# Patient Record
Sex: Female | Born: 1935
Health system: Southern US, Community
[De-identification: ages and names within clinical notes are randomized; demographics above are authoritative.]

## PROBLEM LIST (undated history)

## (undated) DIAGNOSIS — K5909 Other constipation: Secondary | ICD-10-CM

## (undated) DIAGNOSIS — E785 Hyperlipidemia, unspecified: Secondary | ICD-10-CM

## (undated) DIAGNOSIS — K2970 Gastritis, unspecified, without bleeding: Secondary | ICD-10-CM

## (undated) DIAGNOSIS — I872 Venous insufficiency (chronic) (peripheral): Secondary | ICD-10-CM

## (undated) DIAGNOSIS — G8929 Other chronic pain: Secondary | ICD-10-CM

## (undated) DIAGNOSIS — N6019 Diffuse cystic mastopathy of unspecified breast: Secondary | ICD-10-CM

## (undated) DIAGNOSIS — G459 Transient cerebral ischemic attack, unspecified: Secondary | ICD-10-CM

## (undated) DIAGNOSIS — C50919 Malignant neoplasm of unspecified site of unspecified female breast: Secondary | ICD-10-CM

## (undated) DIAGNOSIS — D0362 Melanoma in situ of left upper limb, including shoulder: Secondary | ICD-10-CM

## (undated) DIAGNOSIS — R51 Headache: Secondary | ICD-10-CM

## (undated) DIAGNOSIS — S329XXA Fracture of unspecified parts of lumbosacral spine and pelvis, initial encounter for closed fracture: Secondary | ICD-10-CM

## (undated) DIAGNOSIS — R519 Headache, unspecified: Secondary | ICD-10-CM

## (undated) HISTORY — DX: Fracture of unspecified parts of lumbosacral spine and pelvis, initial encounter for closed fracture: S32.9XXA

## (undated) HISTORY — DX: Gastritis, unspecified, without bleeding: K29.70

## (undated) HISTORY — PX: APPENDECTOMY: SHX54

## (undated) HISTORY — DX: Malignant neoplasm of unspecified site of unspecified female breast: C50.919

## (undated) HISTORY — DX: Other constipation: K59.09

## (undated) HISTORY — DX: Transient cerebral ischemic attack, unspecified: G45.9

## (undated) HISTORY — DX: Hyperlipidemia, unspecified: E78.5

## (undated) HISTORY — DX: Other chronic pain: G89.29

## (undated) HISTORY — PX: BREAST EXCISIONAL BIOPSY: SUR124

## (undated) HISTORY — DX: Headache, unspecified: R51.9

## (undated) HISTORY — PX: EYE SURGERY: SHX253

## (undated) HISTORY — PX: BREAST LUMPECTOMY: SHX2

## (undated) HISTORY — DX: Diffuse cystic mastopathy of unspecified breast: N60.19

## (undated) HISTORY — DX: Venous insufficiency (chronic) (peripheral): I87.2

## (undated) HISTORY — PX: BREAST CYST ASPIRATION: SHX578

## (undated) HISTORY — DX: Headache: R51

---

## 1998-03-25 ENCOUNTER — Other Ambulatory Visit: Admission: RE | Admit: 1998-03-25 | Discharge: 1998-03-25 | Payer: Self-pay | Admitting: Obstetrics and Gynecology

## 1998-09-26 ENCOUNTER — Other Ambulatory Visit: Admission: RE | Admit: 1998-09-26 | Discharge: 1998-09-26 | Payer: Self-pay | Admitting: Obstetrics and Gynecology

## 1999-03-27 ENCOUNTER — Other Ambulatory Visit: Admission: RE | Admit: 1999-03-27 | Discharge: 1999-03-27 | Payer: Self-pay | Admitting: Obstetrics and Gynecology

## 1999-08-04 ENCOUNTER — Ambulatory Visit (HOSPITAL_BASED_OUTPATIENT_CLINIC_OR_DEPARTMENT_OTHER): Admission: RE | Admit: 1999-08-04 | Discharge: 1999-08-04 | Payer: Self-pay | Admitting: General Surgery

## 2000-01-02 ENCOUNTER — Encounter: Admission: RE | Admit: 2000-01-02 | Discharge: 2000-01-02 | Payer: Self-pay | Admitting: General Surgery

## 2000-01-02 ENCOUNTER — Encounter: Payer: Self-pay | Admitting: General Surgery

## 2000-06-10 ENCOUNTER — Other Ambulatory Visit: Admission: RE | Admit: 2000-06-10 | Discharge: 2000-06-10 | Payer: Self-pay | Admitting: Obstetrics and Gynecology

## 2001-01-06 ENCOUNTER — Encounter: Payer: Self-pay | Admitting: General Surgery

## 2001-01-06 ENCOUNTER — Encounter: Admission: RE | Admit: 2001-01-06 | Discharge: 2001-01-06 | Payer: Self-pay | Admitting: General Surgery

## 2001-08-17 ENCOUNTER — Other Ambulatory Visit: Admission: RE | Admit: 2001-08-17 | Discharge: 2001-08-17 | Payer: Self-pay | Admitting: Internal Medicine

## 2002-01-03 ENCOUNTER — Encounter: Payer: Self-pay | Admitting: General Surgery

## 2002-01-03 ENCOUNTER — Encounter: Admission: RE | Admit: 2002-01-03 | Discharge: 2002-01-03 | Payer: Self-pay | Admitting: General Surgery

## 2002-06-07 ENCOUNTER — Ambulatory Visit (HOSPITAL_COMMUNITY): Admission: RE | Admit: 2002-06-07 | Discharge: 2002-06-07 | Payer: Self-pay | Admitting: Gastroenterology

## 2003-01-08 ENCOUNTER — Encounter: Admission: RE | Admit: 2003-01-08 | Discharge: 2003-01-08 | Payer: Self-pay | Admitting: General Surgery

## 2003-01-08 ENCOUNTER — Encounter: Payer: Self-pay | Admitting: General Surgery

## 2003-06-21 LAB — FECAL OCCULT BLOOD, GUAIAC: Fecal Occult Blood: NEGATIVE

## 2004-01-15 ENCOUNTER — Encounter: Admission: RE | Admit: 2004-01-15 | Discharge: 2004-01-15 | Payer: Self-pay | Admitting: General Surgery

## 2004-04-04 ENCOUNTER — Ambulatory Visit: Payer: Self-pay | Admitting: Family Medicine

## 2004-04-21 ENCOUNTER — Ambulatory Visit: Payer: Self-pay | Admitting: Family Medicine

## 2004-10-22 ENCOUNTER — Ambulatory Visit: Payer: Self-pay | Admitting: Family Medicine

## 2004-10-27 ENCOUNTER — Ambulatory Visit: Payer: Self-pay | Admitting: Family Medicine

## 2004-10-27 ENCOUNTER — Other Ambulatory Visit: Admission: RE | Admit: 2004-10-27 | Discharge: 2004-10-27 | Payer: Self-pay | Admitting: Family Medicine

## 2004-10-27 LAB — CONVERTED CEMR LAB: Pap Smear: NORMAL

## 2004-11-06 ENCOUNTER — Ambulatory Visit: Payer: Self-pay | Admitting: Family Medicine

## 2004-12-01 ENCOUNTER — Ambulatory Visit: Payer: Self-pay | Admitting: Family Medicine

## 2005-01-20 ENCOUNTER — Encounter: Admission: RE | Admit: 2005-01-20 | Discharge: 2005-01-20 | Payer: Self-pay | Admitting: General Surgery

## 2005-03-04 ENCOUNTER — Ambulatory Visit: Payer: Self-pay | Admitting: Family Medicine

## 2005-10-07 ENCOUNTER — Ambulatory Visit: Payer: Self-pay | Admitting: Family Medicine

## 2005-10-28 ENCOUNTER — Ambulatory Visit: Payer: Self-pay | Admitting: Family Medicine

## 2006-01-21 ENCOUNTER — Encounter: Admission: RE | Admit: 2006-01-21 | Discharge: 2006-01-21 | Payer: Self-pay | Admitting: General Surgery

## 2006-04-29 ENCOUNTER — Ambulatory Visit: Payer: Self-pay | Admitting: Family Medicine

## 2006-05-26 ENCOUNTER — Ambulatory Visit: Payer: Self-pay | Admitting: Family Medicine

## 2006-08-25 ENCOUNTER — Ambulatory Visit: Payer: Self-pay | Admitting: Family Medicine

## 2006-08-25 LAB — CONVERTED CEMR LAB
Creatinine,U: 57 mg/dL
Hgb A1c MFr Bld: 6.7 % — ABNORMAL HIGH (ref 4.6–6.0)
Microalb Creat Ratio: 12.3 mg/g (ref 0.0–30.0)

## 2006-11-29 ENCOUNTER — Ambulatory Visit: Payer: Self-pay | Admitting: Family Medicine

## 2006-11-29 DIAGNOSIS — E7800 Pure hypercholesterolemia, unspecified: Secondary | ICD-10-CM

## 2006-12-02 LAB — CONVERTED CEMR LAB
ALT: 16 units/L (ref 0–35)
Hgb A1c MFr Bld: 6.9 % — ABNORMAL HIGH (ref 4.6–6.0)
Triglycerides: 106 mg/dL (ref 0–149)
VLDL: 21 mg/dL (ref 0–40)

## 2007-01-25 ENCOUNTER — Encounter: Admission: RE | Admit: 2007-01-25 | Discharge: 2007-01-25 | Payer: Self-pay | Admitting: General Surgery

## 2007-01-27 ENCOUNTER — Encounter: Admission: RE | Admit: 2007-01-27 | Discharge: 2007-01-27 | Payer: Self-pay | Admitting: General Surgery

## 2007-02-01 ENCOUNTER — Encounter (INDEPENDENT_AMBULATORY_CARE_PROVIDER_SITE_OTHER): Payer: Self-pay | Admitting: *Deleted

## 2007-03-08 ENCOUNTER — Ambulatory Visit: Payer: Self-pay | Admitting: Family Medicine

## 2007-03-09 LAB — CONVERTED CEMR LAB: Hgb A1c MFr Bld: 6.5 % — ABNORMAL HIGH (ref 4.6–6.0)

## 2007-06-10 ENCOUNTER — Ambulatory Visit: Payer: Self-pay | Admitting: Family Medicine

## 2007-06-13 LAB — CONVERTED CEMR LAB
Albumin: 3.7 g/dL (ref 3.5–5.2)
BUN: 15 mg/dL (ref 6–23)
Creatinine, Ser: 0.8 mg/dL (ref 0.4–1.2)
GFR calc non Af Amer: 74 mL/min
Hgb A1c MFr Bld: 6.7 % — ABNORMAL HIGH (ref 4.6–6.0)
LDL Cholesterol: 70 mg/dL (ref 0–99)
Phosphorus: 4.4 mg/dL (ref 2.3–4.6)
Potassium: 4.6 meq/L (ref 3.5–5.1)
Total CHOL/HDL Ratio: 3.2

## 2007-07-21 ENCOUNTER — Telehealth: Payer: Self-pay | Admitting: Family Medicine

## 2007-09-23 HISTORY — PX: BREAST LUMPECTOMY: SHX2

## 2007-10-03 ENCOUNTER — Telehealth (INDEPENDENT_AMBULATORY_CARE_PROVIDER_SITE_OTHER): Payer: Self-pay | Admitting: *Deleted

## 2007-10-04 ENCOUNTER — Ambulatory Visit: Payer: Self-pay | Admitting: Family Medicine

## 2007-10-06 ENCOUNTER — Ambulatory Visit: Payer: Self-pay | Admitting: Family Medicine

## 2007-10-06 DIAGNOSIS — N6019 Diffuse cystic mastopathy of unspecified breast: Secondary | ICD-10-CM | POA: Insufficient documentation

## 2007-10-06 DIAGNOSIS — I872 Venous insufficiency (chronic) (peripheral): Secondary | ICD-10-CM | POA: Insufficient documentation

## 2007-10-06 DIAGNOSIS — K5730 Diverticulosis of large intestine without perforation or abscess without bleeding: Secondary | ICD-10-CM

## 2007-10-06 DIAGNOSIS — E119 Type 2 diabetes mellitus without complications: Secondary | ICD-10-CM

## 2007-10-06 LAB — CONVERTED CEMR LAB
Chloride: 111 meq/L (ref 96–112)
Creatinine,U: 87.8 mg/dL
GFR calc Af Amer: 78 mL/min
GFR calc non Af Amer: 65 mL/min
LDL Cholesterol: 66 mg/dL (ref 0–99)
Microalb, Ur: 1.9 mg/dL (ref 0.0–1.9)
Phosphorus: 3.9 mg/dL (ref 2.3–4.6)
Potassium: 4.9 meq/L (ref 3.5–5.1)
Total CHOL/HDL Ratio: 3.4

## 2007-10-12 ENCOUNTER — Encounter: Payer: Self-pay | Admitting: Family Medicine

## 2007-10-12 ENCOUNTER — Encounter (INDEPENDENT_AMBULATORY_CARE_PROVIDER_SITE_OTHER): Payer: Self-pay | Admitting: Diagnostic Radiology

## 2007-10-12 ENCOUNTER — Encounter: Admission: RE | Admit: 2007-10-12 | Discharge: 2007-10-12 | Payer: Self-pay | Admitting: Family Medicine

## 2007-10-18 HISTORY — PX: BREAST BIOPSY: SHX20

## 2007-10-20 ENCOUNTER — Encounter: Payer: Self-pay | Admitting: Family Medicine

## 2007-10-21 ENCOUNTER — Encounter: Admission: RE | Admit: 2007-10-21 | Discharge: 2007-10-21 | Payer: Self-pay | Admitting: Family Medicine

## 2007-10-25 ENCOUNTER — Encounter: Payer: Self-pay | Admitting: Family Medicine

## 2007-10-25 ENCOUNTER — Ambulatory Visit (HOSPITAL_COMMUNITY): Admission: RE | Admit: 2007-10-25 | Discharge: 2007-10-26 | Payer: Self-pay | Admitting: Surgery

## 2007-10-25 ENCOUNTER — Encounter (INDEPENDENT_AMBULATORY_CARE_PROVIDER_SITE_OTHER): Payer: Self-pay | Admitting: Surgery

## 2007-10-28 ENCOUNTER — Ambulatory Visit: Payer: Self-pay | Admitting: Oncology

## 2007-11-09 ENCOUNTER — Encounter: Payer: Self-pay | Admitting: Family Medicine

## 2007-11-09 LAB — CBC WITH DIFFERENTIAL/PLATELET
Basophils Absolute: 0.1 10*3/uL (ref 0.0–0.1)
Eosinophils Absolute: 0.3 10*3/uL (ref 0.0–0.5)
HCT: 40.6 % (ref 34.8–46.6)
HGB: 13.7 g/dL (ref 11.6–15.9)
MCH: 31.7 pg (ref 26.0–34.0)
MCV: 94.3 fL (ref 81.0–101.0)
MONO%: 7.8 % (ref 0.0–13.0)
NEUT#: 8.7 10*3/uL — ABNORMAL HIGH (ref 1.5–6.5)
NEUT%: 69.8 % (ref 39.6–76.8)
RDW: 13.3 % (ref 11.3–14.5)

## 2007-11-10 LAB — COMPREHENSIVE METABOLIC PANEL
ALT: 18 U/L (ref 0–35)
BUN: 20 mg/dL (ref 6–23)
CO2: 28 mEq/L (ref 19–32)
Creatinine, Ser: 0.78 mg/dL (ref 0.40–1.20)
Total Bilirubin: 0.5 mg/dL (ref 0.3–1.2)

## 2007-11-10 LAB — LACTATE DEHYDROGENASE: LDH: 123 U/L (ref 94–250)

## 2007-11-10 LAB — CANCER ANTIGEN 27.29: CA 27.29: 34 U/mL (ref 0–39)

## 2007-11-10 LAB — VITAMIN D 25 HYDROXY (VIT D DEFICIENCY, FRACTURES): Vit D, 25-Hydroxy: 51 ng/mL (ref 30–89)

## 2007-11-14 ENCOUNTER — Ambulatory Visit (HOSPITAL_COMMUNITY): Admission: RE | Admit: 2007-11-14 | Discharge: 2007-11-14 | Payer: Self-pay | Admitting: Oncology

## 2007-11-29 ENCOUNTER — Encounter: Admission: RE | Admit: 2007-11-29 | Discharge: 2007-11-29 | Payer: Self-pay | Admitting: Oncology

## 2007-12-27 ENCOUNTER — Ambulatory Visit: Payer: Self-pay | Admitting: Oncology

## 2007-12-31 ENCOUNTER — Encounter: Payer: Self-pay | Admitting: Family Medicine

## 2008-02-09 ENCOUNTER — Encounter: Payer: Self-pay | Admitting: Family Medicine

## 2008-04-12 ENCOUNTER — Ambulatory Visit: Payer: Self-pay | Admitting: Family Medicine

## 2008-04-16 LAB — CONVERTED CEMR LAB
ALT: 19 units/L (ref 0–35)
Cholesterol: 132 mg/dL (ref 0–200)
LDL Cholesterol: 64 mg/dL (ref 0–99)
Microalb Creat Ratio: 4.5 mg/g (ref 0.0–30.0)
Triglycerides: 116 mg/dL (ref 0–149)
VLDL: 23 mg/dL (ref 0–40)

## 2008-04-27 ENCOUNTER — Ambulatory Visit: Payer: Self-pay | Admitting: Oncology

## 2008-05-10 ENCOUNTER — Encounter: Payer: Self-pay | Admitting: Family Medicine

## 2008-05-11 ENCOUNTER — Ambulatory Visit: Payer: Self-pay | Admitting: Genetic Counselor

## 2008-05-11 ENCOUNTER — Encounter: Payer: Self-pay | Admitting: Family Medicine

## 2008-08-17 ENCOUNTER — Telehealth (INDEPENDENT_AMBULATORY_CARE_PROVIDER_SITE_OTHER): Payer: Self-pay | Admitting: *Deleted

## 2008-08-23 DIAGNOSIS — G459 Transient cerebral ischemic attack, unspecified: Secondary | ICD-10-CM

## 2008-08-23 HISTORY — DX: Transient cerebral ischemic attack, unspecified: G45.9

## 2008-09-07 ENCOUNTER — Inpatient Hospital Stay (HOSPITAL_COMMUNITY): Admission: EM | Admit: 2008-09-07 | Discharge: 2008-09-08 | Payer: Self-pay | Admitting: Emergency Medicine

## 2008-09-07 ENCOUNTER — Ambulatory Visit: Payer: Self-pay | Admitting: Infectious Diseases

## 2008-09-08 ENCOUNTER — Encounter: Payer: Self-pay | Admitting: Family Medicine

## 2008-09-10 ENCOUNTER — Ambulatory Visit: Payer: Self-pay | Admitting: Family Medicine

## 2008-09-10 DIAGNOSIS — C50912 Malignant neoplasm of unspecified site of left female breast: Secondary | ICD-10-CM | POA: Insufficient documentation

## 2008-09-10 DIAGNOSIS — G459 Transient cerebral ischemic attack, unspecified: Secondary | ICD-10-CM | POA: Insufficient documentation

## 2008-09-14 ENCOUNTER — Ambulatory Visit (HOSPITAL_COMMUNITY): Admission: RE | Admit: 2008-09-14 | Discharge: 2008-09-14 | Payer: Self-pay | Admitting: Family Medicine

## 2008-09-14 ENCOUNTER — Encounter: Payer: Self-pay | Admitting: Family Medicine

## 2008-09-20 ENCOUNTER — Telehealth: Payer: Self-pay | Admitting: Family Medicine

## 2008-10-04 ENCOUNTER — Ambulatory Visit: Payer: Self-pay | Admitting: Family Medicine

## 2008-10-08 LAB — CONVERTED CEMR LAB
ALT: 17 units/L (ref 0–35)
AST: 19 units/L (ref 0–37)
Albumin: 3.6 g/dL (ref 3.5–5.2)
BUN: 20 mg/dL (ref 6–23)
Cholesterol: 125 mg/dL (ref 0–200)
GFR calc non Af Amer: 73.59 mL/min (ref >60)
Glucose, Bld: 89 mg/dL (ref 70–99)
Hgb A1c MFr Bld: 6.7 % — ABNORMAL HIGH (ref 4.6–6.5)
Microalb Creat Ratio: 8.5 mg/g (ref 0.0–30.0)
Potassium: 4.1 meq/L (ref 3.5–5.1)
Total Protein: 6.9 g/dL (ref 6.0–8.3)
VLDL: 18.8 mg/dL (ref 0.0–40.0)

## 2008-10-12 ENCOUNTER — Encounter: Admission: RE | Admit: 2008-10-12 | Discharge: 2008-10-12 | Payer: Self-pay | Admitting: Oncology

## 2008-10-12 LAB — HM MAMMOGRAPHY: HM Mammogram: NORMAL

## 2008-10-18 ENCOUNTER — Ambulatory Visit: Payer: Self-pay | Admitting: Family Medicine

## 2008-10-18 DIAGNOSIS — M8580 Other specified disorders of bone density and structure, unspecified site: Secondary | ICD-10-CM

## 2008-10-19 ENCOUNTER — Telehealth: Payer: Self-pay | Admitting: Family Medicine

## 2008-10-23 ENCOUNTER — Encounter: Payer: Self-pay | Admitting: Family Medicine

## 2008-11-01 ENCOUNTER — Encounter: Payer: Self-pay | Admitting: Family Medicine

## 2008-11-21 ENCOUNTER — Ambulatory Visit: Payer: Self-pay | Admitting: Oncology

## 2008-11-27 ENCOUNTER — Encounter: Payer: Self-pay | Admitting: Family Medicine

## 2008-11-27 LAB — CBC WITH DIFFERENTIAL/PLATELET
BASO%: 0.4 % (ref 0.0–2.0)
EOS%: 2.5 % (ref 0.0–7.0)
LYMPH%: 18.2 % (ref 14.0–49.7)
MCH: 31.9 pg (ref 25.1–34.0)
MCHC: 33.8 g/dL (ref 31.5–36.0)
MCV: 94.4 fL (ref 79.5–101.0)
MONO%: 6.2 % (ref 0.0–14.0)
NEUT#: 8.8 10*3/uL — ABNORMAL HIGH (ref 1.5–6.5)
Platelets: 207 10*3/uL (ref 145–400)
RBC: 4.32 10*6/uL (ref 3.70–5.45)
RDW: 13.4 % (ref 11.2–14.5)

## 2008-11-28 ENCOUNTER — Encounter: Payer: Self-pay | Admitting: Family Medicine

## 2008-11-28 LAB — COMPREHENSIVE METABOLIC PANEL
ALT: 13 U/L (ref 0–35)
AST: 15 U/L (ref 0–37)
Albumin: 4 g/dL (ref 3.5–5.2)
Alkaline Phosphatase: 61 U/L (ref 39–117)
Glucose, Bld: 131 mg/dL — ABNORMAL HIGH (ref 70–99)
Potassium: 4.4 mEq/L (ref 3.5–5.3)
Sodium: 141 mEq/L (ref 135–145)
Total Bilirubin: 0.4 mg/dL (ref 0.3–1.2)
Total Protein: 7.3 g/dL (ref 6.0–8.3)

## 2008-11-28 LAB — CANCER ANTIGEN 27.29: CA 27.29: 35 U/mL (ref 0–39)

## 2008-12-21 ENCOUNTER — Ambulatory Visit: Payer: Self-pay | Admitting: Oncology

## 2008-12-21 ENCOUNTER — Encounter: Payer: Self-pay | Admitting: Family Medicine

## 2009-01-29 LAB — HM DIABETES EYE EXAM: HM Diabetic Eye Exam: NORMAL

## 2009-02-28 ENCOUNTER — Ambulatory Visit: Payer: Self-pay | Admitting: Family Medicine

## 2009-04-10 ENCOUNTER — Ambulatory Visit: Payer: Self-pay | Admitting: Family Medicine

## 2009-04-11 LAB — CONVERTED CEMR LAB
ALT: 15 units/L (ref 0–35)
AST: 19 units/L (ref 0–37)
BUN: 23 mg/dL (ref 6–23)
Creatinine, Ser: 0.8 mg/dL (ref 0.4–1.2)
GFR calc non Af Amer: 73.49 mL/min (ref >60)
Glucose, Bld: 93 mg/dL (ref 70–99)
LDL Cholesterol: 69 mg/dL (ref 0–99)
Phosphorus: 3.9 mg/dL (ref 2.3–4.6)
Total CHOL/HDL Ratio: 3
VLDL: 23.2 mg/dL (ref 0.0–40.0)

## 2009-05-31 ENCOUNTER — Encounter: Payer: Self-pay | Admitting: Family Medicine

## 2009-06-19 ENCOUNTER — Telehealth: Payer: Self-pay | Admitting: Family Medicine

## 2009-06-20 ENCOUNTER — Encounter: Payer: Self-pay | Admitting: Family Medicine

## 2009-06-24 ENCOUNTER — Telehealth: Payer: Self-pay | Admitting: Family Medicine

## 2009-06-24 ENCOUNTER — Encounter: Payer: Self-pay | Admitting: Family Medicine

## 2009-06-28 ENCOUNTER — Encounter: Payer: Self-pay | Admitting: Family Medicine

## 2009-07-12 ENCOUNTER — Ambulatory Visit: Payer: Self-pay | Admitting: Oncology

## 2009-07-16 LAB — COMPREHENSIVE METABOLIC PANEL
AST: 20 U/L (ref 0–37)
Albumin: 4.1 g/dL (ref 3.5–5.2)
BUN: 17 mg/dL (ref 6–23)
CO2: 31 mEq/L (ref 19–32)
Calcium: 9.5 mg/dL (ref 8.4–10.5)
Chloride: 105 mEq/L (ref 96–112)
Creatinine, Ser: 0.78 mg/dL (ref 0.40–1.20)
Glucose, Bld: 76 mg/dL (ref 70–99)
Potassium: 3.8 mEq/L (ref 3.5–5.3)

## 2009-07-16 LAB — CBC WITH DIFFERENTIAL/PLATELET
BASO%: 1.4 % (ref 0.0–2.0)
Basophils Absolute: 0.1 10*3/uL (ref 0.0–0.1)
Eosinophils Absolute: 0.3 10*3/uL (ref 0.0–0.5)
HCT: 43.4 % (ref 34.8–46.6)
HGB: 14 g/dL (ref 11.6–15.9)
LYMPH%: 24.4 % (ref 14.0–49.7)
MCHC: 32.3 g/dL (ref 31.5–36.0)
MONO#: 0.6 10*3/uL (ref 0.1–0.9)
NEUT%: 65.4 % (ref 38.4–76.8)
Platelets: 190 10*3/uL (ref 145–400)
WBC: 10 10*3/uL (ref 3.9–10.3)
lymph#: 2.4 10*3/uL (ref 0.9–3.3)

## 2009-07-16 LAB — VITAMIN D 25 HYDROXY (VIT D DEFICIENCY, FRACTURES): Vit D, 25-Hydroxy: 56 ng/mL (ref 30–89)

## 2009-07-16 LAB — LACTATE DEHYDROGENASE: LDH: 125 U/L (ref 94–250)

## 2009-07-23 ENCOUNTER — Encounter: Payer: Self-pay | Admitting: Family Medicine

## 2009-08-09 ENCOUNTER — Ambulatory Visit: Payer: Self-pay | Admitting: Family Medicine

## 2009-08-09 ENCOUNTER — Telehealth (INDEPENDENT_AMBULATORY_CARE_PROVIDER_SITE_OTHER): Payer: Self-pay | Admitting: *Deleted

## 2009-08-12 ENCOUNTER — Telehealth: Payer: Self-pay | Admitting: Family Medicine

## 2009-10-01 ENCOUNTER — Ambulatory Visit: Payer: Self-pay | Admitting: Family Medicine

## 2009-10-02 LAB — CONVERTED CEMR LAB
ALT: 17 units/L (ref 0–35)
Basophils Absolute: 0.1 10*3/uL (ref 0.0–0.1)
CO2: 31 meq/L (ref 19–32)
Chloride: 105 meq/L (ref 96–112)
Creatinine, Ser: 0.7 mg/dL (ref 0.4–1.2)
Direct LDL: 46.3 mg/dL
Eosinophils Relative: 2.9 % (ref 0.0–5.0)
GFR calc non Af Amer: 84.24 mL/min (ref >60)
HCT: 39.1 % (ref 36.0–46.0)
Hemoglobin: 13.4 g/dL (ref 12.0–15.0)
Hgb A1c MFr Bld: 6.7 % — ABNORMAL HIGH (ref 4.6–6.5)
Lymphocytes Relative: 15.2 % (ref 12.0–46.0)
Lymphs Abs: 1.6 10*3/uL (ref 0.7–4.0)
Microalb, Ur: 0.5 mg/dL (ref 0.0–1.9)
Monocytes Relative: 5.6 % (ref 3.0–12.0)
Phosphorus: 3.4 mg/dL (ref 2.3–4.6)
Platelets: 174 10*3/uL (ref 150.0–400.0)
Sodium: 145 meq/L (ref 135–145)
TSH: 2.55 microintl units/mL (ref 0.35–5.50)
Total CHOL/HDL Ratio: 4
VLDL: 44.8 mg/dL — ABNORMAL HIGH (ref 0.0–40.0)
WBC: 10.6 10*3/uL — ABNORMAL HIGH (ref 4.5–10.5)

## 2009-10-14 ENCOUNTER — Encounter: Admission: RE | Admit: 2009-10-14 | Discharge: 2009-10-14 | Payer: Self-pay | Admitting: Oncology

## 2009-10-24 ENCOUNTER — Observation Stay (HOSPITAL_COMMUNITY): Admission: EM | Admit: 2009-10-24 | Discharge: 2009-10-26 | Payer: Self-pay | Admitting: Emergency Medicine

## 2009-10-24 ENCOUNTER — Ambulatory Visit: Payer: Self-pay | Admitting: Cardiovascular Disease

## 2009-10-24 ENCOUNTER — Telehealth: Payer: Self-pay | Admitting: Family Medicine

## 2009-10-25 ENCOUNTER — Encounter (INDEPENDENT_AMBULATORY_CARE_PROVIDER_SITE_OTHER): Payer: Self-pay | Admitting: Internal Medicine

## 2009-10-25 ENCOUNTER — Ambulatory Visit: Payer: Self-pay | Admitting: Vascular Surgery

## 2009-11-06 ENCOUNTER — Ambulatory Visit: Payer: Self-pay | Admitting: Family Medicine

## 2009-11-06 DIAGNOSIS — G43809 Other migraine, not intractable, without status migrainosus: Secondary | ICD-10-CM

## 2009-11-08 ENCOUNTER — Telehealth: Payer: Self-pay | Admitting: Family Medicine

## 2009-11-15 ENCOUNTER — Encounter: Payer: Self-pay | Admitting: Family Medicine

## 2009-11-29 ENCOUNTER — Encounter: Admission: RE | Admit: 2009-11-29 | Discharge: 2009-11-29 | Payer: Self-pay | Admitting: Oncology

## 2009-12-10 ENCOUNTER — Encounter: Payer: Self-pay | Admitting: Family Medicine

## 2009-12-13 ENCOUNTER — Encounter: Payer: Self-pay | Admitting: Family Medicine

## 2010-01-14 ENCOUNTER — Ambulatory Visit: Payer: Self-pay | Admitting: Oncology

## 2010-01-16 LAB — CBC WITH DIFFERENTIAL/PLATELET
BASO%: 0.4 % (ref 0.0–2.0)
EOS%: 2 % (ref 0.0–7.0)
MCH: 31.4 pg (ref 25.1–34.0)
MCHC: 32.9 g/dL (ref 31.5–36.0)
MCV: 95.5 fL (ref 79.5–101.0)
MONO%: 6.4 % (ref 0.0–14.0)
NEUT%: 72 % (ref 38.4–76.8)
RDW: 13.9 % (ref 11.2–14.5)
lymph#: 2.1 10*3/uL (ref 0.9–3.3)

## 2010-01-16 LAB — COMPREHENSIVE METABOLIC PANEL
ALT: 11 U/L (ref 0–35)
AST: 14 U/L (ref 0–37)
Alkaline Phosphatase: 54 U/L (ref 39–117)
Calcium: 10.1 mg/dL (ref 8.4–10.5)
Chloride: 104 mEq/L (ref 96–112)
Creatinine, Ser: 0.87 mg/dL (ref 0.40–1.20)
Potassium: 4.3 mEq/L (ref 3.5–5.3)

## 2010-01-16 LAB — VITAMIN D 25 HYDROXY (VIT D DEFICIENCY, FRACTURES): Vit D, 25-Hydroxy: 53 ng/mL (ref 30–89)

## 2010-01-23 ENCOUNTER — Encounter: Payer: Self-pay | Admitting: Family Medicine

## 2010-02-13 ENCOUNTER — Ambulatory Visit: Payer: Self-pay | Admitting: Family Medicine

## 2010-02-13 DIAGNOSIS — M1990 Unspecified osteoarthritis, unspecified site: Secondary | ICD-10-CM | POA: Insufficient documentation

## 2010-02-20 ENCOUNTER — Ambulatory Visit: Payer: Self-pay | Admitting: Family Medicine

## 2010-03-06 ENCOUNTER — Encounter: Payer: Self-pay | Admitting: Family Medicine

## 2010-04-08 ENCOUNTER — Ambulatory Visit: Payer: Self-pay | Admitting: Family Medicine

## 2010-04-09 LAB — CONVERTED CEMR LAB
ALT: 13 units/L (ref 0–35)
AST: 19 units/L (ref 0–37)
Calcium: 9.6 mg/dL (ref 8.4–10.5)
Creatinine, Ser: 0.8 mg/dL (ref 0.4–1.2)
GFR calc non Af Amer: 72.26 mL/min (ref >60)
Glucose, Bld: 124 mg/dL — ABNORMAL HIGH (ref 70–99)
HDL: 36.4 mg/dL — ABNORMAL LOW (ref >39.00)
LDL Cholesterol: 59 mg/dL (ref 0–99)
Phosphorus: 2.7 mg/dL (ref 2.3–4.6)
Potassium: 4.4 meq/L (ref 3.5–5.1)
Sodium: 137 meq/L (ref 135–145)
Total CHOL/HDL Ratio: 3
VLDL: 31.2 mg/dL (ref 0.0–40.0)

## 2010-04-14 ENCOUNTER — Ambulatory Visit: Payer: Self-pay | Admitting: Family Medicine

## 2010-04-25 ENCOUNTER — Telehealth: Payer: Self-pay | Admitting: Family Medicine

## 2010-05-27 ENCOUNTER — Ambulatory Visit
Admission: RE | Admit: 2010-05-27 | Discharge: 2010-05-27 | Payer: Self-pay | Source: Home / Self Care | Attending: Family Medicine | Admitting: Family Medicine

## 2010-05-27 LAB — HM DIABETES FOOT EXAM

## 2010-05-30 ENCOUNTER — Encounter: Payer: Self-pay | Admitting: Family Medicine

## 2010-06-16 ENCOUNTER — Encounter: Payer: Self-pay | Admitting: General Surgery

## 2010-06-20 ENCOUNTER — Encounter: Payer: Self-pay | Admitting: Family Medicine

## 2010-06-26 NOTE — Letter (Signed)
Summary: [facility]  [facility]   Imported By: Maryln Gottron 04/18/2010 14:09:46  _____________________________________________________________________  External Attachment:    Type:   Image     Comment:   External Document

## 2010-06-26 NOTE — Progress Notes (Signed)
Summary: Rx for Heating Pad  Phone Note From Pharmacy Call back at [phone]   Caller: [facility] Call For: Dr. Milinda Antis  Summary of Call: Received faxed request for a heating pad.  Form in your IN box.  Please advise. Initial call taken by: Linde Gillis CMA Duncan Dull),  June 24, 2009 9:07 AM  Follow-up for Phone Call        please ask her if she really wants one --- they are fairly inexpensive to just buy..- let me know , I will hold on to papers  Follow-up by: Judith Part MD,  June 24, 2009 10:19 AM  Additional Follow-up for Phone Call Additional follow up Details #1::        Left message with pts sister for pt to call.            Lowella Petties CMA  June 24, 2009 10:56 AM  Pt states she doesnt thinks she needs the heating pad but asks if you think she should get the new glucometer.  yes- go ahead with that I signed form- Lyla Son took it to fax Additional Follow-up by: Lowella Petties CMA,  June 24, 2009 3:00 PM    Additional Follow-up for Phone Call Additional follow up Details #2::    Patient notified as instructed Follow-up by: Linde Gillis CMA Duncan Dull),  June 25, 2009 12:37 PM

## 2010-06-26 NOTE — Miscellaneous (Signed)
Summary: Controlled Substance Agreement  Controlled Substance Agreement   Imported By: Lanelle Bal 11/11/2009 07:45:19  _____________________________________________________________________  External Attachment:    Type:   Image     Comment:   External Document

## 2010-06-26 NOTE — Consult Note (Signed)
Summary: [facility]  [facility]   Imported By: Lanelle Bal 11/27/2009 11:34:26  _____________________________________________________________________  External Attachment:    Type:   Image     Comment:   External Document

## 2010-06-26 NOTE — Progress Notes (Signed)
Summary: FLUOCINOLONE ACETONIDE 0.01 % CREA   Phone Note From Pharmacy   Caller: [facility]* Call For: Dr. Dayton Martes   Summary of Call: [facility] called and said that they do not have the FLUOCINOLONE ACETONIDE 0.01 % CREA that was prescribed for her. Patient is there saying she needs it today and wants to know if she can get something else. Please advise.  Initial call taken by: Melody Comas,  August 09, 2009 3:32 PM  Follow-up for Phone Call        Another topical equivalent steroid would be fine. Follow-up by: Ruthe Mannan MD,  August 09, 2009 3:46 PM  Additional Follow-up for Phone Call Additional follow up Details #1::        Pharmacy advised. Additional Follow-up by: Delilah Shan CMA Duncan Dull),  August 09, 2009 4:22 PM

## 2010-06-26 NOTE — Assessment & Plan Note (Signed)
Summary: ROA PER DR TOWER.  CYD   Vital Signs:  Patient profile:   77 year old female Height:      63.5 inches Weight:      118.75 pounds BMI:     20.78 Temp:     97.8 degrees F oral Pulse rate:   80 / minute Pulse rhythm:   regular BP sitting:   128 / 70  (right arm) Cuff size:   regular  Vitals Entered By: Lewanda Rife LPN (Oct 01, 2009 8:28 AM) CC: follow-up visit   History of Present Illness: here for f/u of HTN and DM and lipids   feels good for her age  a little weak at times   due for labs today    last check in fall good LDL at 91 and good AIC 6.7 sugars are good - in am 115 or below generally (doet not check every single day)  opthy- sept- Dr Ashley Royalty 01/29/09 - nothing new  has macular degeneration -- is on some vits for vit   wt is stable   hx of tia- no reocuurance  bp in good control 128/70 today   mam due - scheduled for may 23rd  Dr Donnie Coffin ordered bone density in july goes every 6 mo  sees Dr Ezzard Standing for exam in july     Allergies: 1)  ! Sulfa 2)  ! Jonne Ply  Past History:  Past Medical History: Last updated: 10/18/2008 DM 2--mild retinopathy gout (injection in past ) fibrocystic breasts  venous insufficiency hyperlipidemia  breast cancer TIA 4/10 with neg MRI/MRA and CT and carotid dopplers chronic constipation   opthy- Dr Ashley Royalty podiatry- --Dr Charlsie Merles   Past Surgical History: Last updated: 10/19/2008 Breast cyst- drained Heel dexa screen- normal (03/2000) Dexa- mild osteopenia (02/2001) Eye surgery- retinal, then cataract Colonoscopy- polyps, diverticulitis, hemorrhoids (1998) Colonoscopy- diverticulitis, hem (05/2002) Dexa- no change (07/2003) 5/09 L breast mass - bx show invasive ductal carcinoma (pend eval Dr Ezzard Standing) lumpectomy with sentinel LN biopsy 5/09 7/07 [facility] neg [facility]  Family History: Last updated: 09/10/2008 2 sisters with breast cancer  mother ? TIA or stroker   Social History: Last updated:  09/10/2008 lives with and cares for both sisters  non smoker no alcohol strong faith   Risk Factors: Smoking Status: never (10/06/2007)  Review of Systems General:  Complains of fatigue; denies fever, loss of appetite, and malaise. Eyes:  Denies blurring and eye irritation. CV:  Denies chest pain or discomfort, lightheadness, palpitations, and shortness of breath with exertion. Resp:  Denies cough, shortness of breath, and wheezing. GI:  Denies abdominal pain, bloody stools, change in bowel habits, indigestion, and nausea. GU:  Denies dysuria and urinary frequency. MS:  Complains of stiffness; denies muscle aches, cramps, and muscle weakness. Derm:  Denies itching, lesion(s), poor wound healing, and rash. Neuro:  Denies numbness and tingling. Psych:  Denies anxiety and depression. Endo:  Denies cold intolerance, excessive thirst, excessive urination, and heat intolerance. Heme:  Denies abnormal bruising and bleeding.  Physical Exam  General:  slim and well appearing Head:  normocephalic, atraumatic, and no abnormalities observed.   Eyes:  vision grossly intact, pupils equal, pupils round, and pupils reactive to light.   Nose:  no nasal discharge.   Mouth:  pharynx pink and moist.   Neck:  supple with full rom and no masses or thyromegally, no JVD or carotid bruit  Chest Wall:  No deformities, masses, or tenderness noted. Lungs:  Normal respiratory effort,  chest expands symmetrically. Lungs are clear to auscultation, no crackles or wheezes. Heart:  Normal rate and regular rhythm. S1 and S2 normal without gallop, murmur, click, rub or other extra sounds. Abdomen:  Bowel sounds positive,abdomen soft and non-tender without masses, organomegaly or hernias noted. no renal bruits  Msk:  No deformity or scoliosis noted of thoracic or lumbar spine.   mild to moderate kyphosis no acute joint changes  Pulses:  R and L carotid,radial,femoral,dorsalis pedis and posterior tibial pulses are  full and equal bilaterally Extremities:  No clubbing, cyanosis, edema, or deformity noted with normal full range of motion of all joints.   varicosities noted - compressible and nt  Neurologic:  sensation intact to light touch, gait normal, and DTRs symmetrical and normal.  no tremor  Skin:  Intact without suspicious lesions or rashes Cervical Nodes:  No lymphadenopathy noted Inguinal Nodes:  No significant adenopathy Psych:  normal affect, talkative and pleasant   Diabetes Management Exam:    Eye Exam:       Eye Exam done elsewhere          Date: 01/29/2009          Results: normal          Done by: Dr Ashley Royalty   Impression & Recommendations:  Problem # 1:  OSTEOPENIA (ICD-733.90) Assessment Unchanged followed by pt's oncologist  vit D level good in fall rev rec for ca and D and exercise check tsh today and renal panel Her updated medication list for this problem includes:    Calcium Carbonate-vitamin D 600-400 Mg-unit Tabs (Calcium carbonate-vitamin d) .Marland Kitchen... 2 tabs by mouth daily  Orders: Venipuncture (40981) TLB-Lipid Panel (80061-LIPID) TLB-Renal Function Panel (80069-RENAL) TLB-CBC Platelet - w/Differential (85025-CBCD) TLB-TSH (Thyroid Stimulating Hormone) (84443-TSH) TLB-AST (SGOT) (84450-SGOT) TLB-ALT (SGPT) (84460-ALT) TLB-A1C / Hgb A1C (Glycohemoglobin) (83036-A1C) TLB-Microalbumin/Creat Ratio, Urine (82043-MALB)  Problem # 2:  TIA (ICD-435.9) Assessment: Unchanged no more recurrences chol and bp are well controlled lab today Her updated medication list for this problem includes:    Aspirin 325 Mg Tabs (Aspirin) .Marland Kitchen... Take 1 tablet by mouth once a day  Orders: Venipuncture (19147) TLB-Lipid Panel (80061-LIPID) TLB-Renal Function Panel (80069-RENAL) TLB-CBC Platelet - w/Differential (85025-CBCD) TLB-TSH (Thyroid Stimulating Hormone) (84443-TSH) TLB-AST (SGOT) (84450-SGOT) TLB-ALT (SGPT) (84460-ALT) TLB-A1C / Hgb A1C (Glycohemoglobin)  (83036-A1C) TLB-Microalbumin/Creat Ratio, Urine (82043-MALB)  Problem # 3:  VENOUS INSUFFICIENCY (ICD-459.81) Assessment: Unchanged refil px for supp stockings- they help pain in lower legs  per pt this does really help  Problem # 4:  DIABETES MELLITUS, TYPE II (ICD-250.00) Assessment: Unchanged  check AIC and microalb today per pt is stable continued good diet opthy is up to date imms up to date  unfortunately - cannot afford shingles shot for now  Her updated medication list for this problem includes:    Aspirin 325 Mg Tabs (Aspirin) .Marland Kitchen... Take 1 tablet by mouth once a day  Orders: Venipuncture (82956) TLB-Lipid Panel (80061-LIPID) TLB-Renal Function Panel (80069-RENAL) TLB-CBC Platelet - w/Differential (85025-CBCD) TLB-TSH (Thyroid Stimulating Hormone) (84443-TSH) TLB-AST (SGOT) (84450-SGOT) TLB-ALT (SGPT) (84460-ALT) TLB-A1C / Hgb A1C (Glycohemoglobin) (83036-A1C) TLB-Microalbumin/Creat Ratio, Urine (82043-MALB)  Labs Reviewed: Creat: 0.8 (04/10/2009)     Last Eye Exam: diabetic retinopathy (01/24/2008) Reviewed HgBA1c results: 6.7 (04/10/2009)  6.7 (10/04/2008)  Problem # 5:  HYPERCHOLESTEROLEMIA, PURE (ICD-272.0) Assessment: Unchanged  has been at goal with statin and diet  continue both rev low sat fat diet  lab today f/u 6 mo  Her updated medication list for this  problem includes:    Zocor 20 Mg Tabs (Simvastatin) .Marland Kitchen... 1 by mouth once daily  Orders: Venipuncture (02725) TLB-Lipid Panel (80061-LIPID) TLB-Renal Function Panel (80069-RENAL) TLB-CBC Platelet - w/Differential (85025-CBCD) TLB-TSH (Thyroid Stimulating Hormone) (84443-TSH) TLB-AST (SGOT) (84450-SGOT) TLB-ALT (SGPT) (84460-ALT) TLB-A1C / Hgb A1C (Glycohemoglobin) (83036-A1C) TLB-Microalbumin/Creat Ratio, Urine (82043-MALB) Prescription Created Electronically [phone])  Labs Reviewed: SGOT: 19 (04/10/2009)   SGPT: 15 (04/10/2009)   HDL:44.20 (04/10/2009), 39.30 (10/04/2008)  LDL:69  (04/10/2009), 67 (03/47/4259)  Chol:136 (04/10/2009), 125 (10/04/2008)  Trig:116.0 (04/10/2009), 94.0 (10/04/2008)  Problem # 6:  NEOPLASM, MALIGNANT, BREAST (ICD-174.9) Assessment: Comment Only pt has mam and oncol and surg f/u soon  adv to ask oncol whether colonosc is necessary for her   Complete Medication List: 1)  Acid Reducer 75 Mg Tabs (Ranitidine hcl) .... One tab by mouth at bedtime 2)  Freestyle Test Strp (Glucose blood) .... Test blood sugar once daily 3)  Zocor 20 Mg Tabs (Simvastatin) .Marland Kitchen.. 1 by mouth once daily 4)  Knee High Compression Stockings 30/40 Mm Hg  .... To wear once daily for venous insufficiency 459.81 5)  Aspirin 325 Mg Tabs (Aspirin) .... Take 1 tablet by mouth once a day 6)  Femara 2.5 Mg Tabs (Letrozole) .Marland Kitchen.. 1 by mouth once daily 7)  Fish Oil 1000 Mg Caps (Omega-3 fatty acids) .... Take 1 capsule by mouth once a day 8)  Miralax Powd (Polyethylene glycol 3350) .... Use as directed as needed 9)  Tylenol Extra Strength 500 Mg Tabs (Acetaminophen) .... As needed 10)  Calcium Carbonate-vitamin D 600-400 Mg-unit Tabs (Calcium carbonate-vitamin d) .... 2 tabs by mouth daily 11)  Preservision/lutein Caps (Multiple vitamins-minerals) .... Take 1 capsule by mouth two times a day  Patient Instructions: 1)  keep up the good work with diet and staying active  2)  no change in medicines 3)  I sent your zocor px to [facility] 4)  here is your hose px  5)  labs today 6)  follow up with me in about 6 months  Prescriptions: KNEE HIGH COMPRESSION STOCKINGS 30/40 MM HG to wear once daily for venous insufficiency 459.81  #1 pair x 5   Entered and Authorized by:   Judith Part MD   Signed by:   Judith Part MD on 10/01/2009   Method used:   Print then Give to Patient   RxID:   [id number] ZOCOR 20 MG  TABS (SIMVASTATIN) 1 by mouth once daily  #30 x 11   Entered and Authorized by:   Judith Part MD   Signed by:   Judith Part MD on 10/01/2009   Method used:    Electronically to        [facility]* (retail)       [address]       [place], [place]  [address]       Ph: [phone]       Fax: [phone]   RxID:   [id number]   Current Allergies (reviewed today): ! SULFA ! ASA   Preventive Care Screening  Contraindications of Treatment or Deferment of Test/Procedure:    Test/Procedure: Zoster vaccine    Reason for deferment: declined     cannot afford zoster vaccine- no coverage

## 2010-06-26 NOTE — Progress Notes (Signed)
Summary: nutrition classes  Phone Note Call from Patient Call back at Home Phone [phone]   Caller: Patient Call For: Judith Part MD Summary of Call: Patient is asking about taking some nutrition classes. She wants to know if you think that would be a good idea for her. She know that you are out until the 27th. Please advise  Initial call taken by: Melody Comas,  November 08, 2009 9:04 AM  Follow-up for Phone Call        that is never a bad idea if she wants a better idea of what and what not to eat for her health Follow-up by: Judith Part MD,  November 08, 2009 2:45 PM  Additional Follow-up for Phone Call Additional follow up Details #1::        Patient advised. She says that when she was contacted about the nutrition classes before she didn't feel like signing up. They are supposed to be checking with her again, and she says that now she is ready for it.  Additional Follow-up by: Melody Comas,  November 08, 2009 2:56 PM

## 2010-06-26 NOTE — Assessment & Plan Note (Signed)
Summary: ??pneumonia/alc   Vital Signs:  Patient profile:   77 year old female Height:      63.5 inches Weight:      118.50 pounds BMI:     20.74 Temp:     97.9 degrees F oral Pulse rate:   88 / minute Pulse rhythm:   regular BP sitting:   110 / 62  (left arm) Cuff size:   regular  Vitals Entered By: Delilah Shan CMA Duncan Dull) (April 14, 2010 2:10 PM) CC: ? pneumonia   History of Present Illness: ST for a few days and used OTC meds, using salt water gargles.  "It was easing along until Saturday and then last night my nose started running."  Inc in cough and voice change.  No change in breathing.  Inc in cough due to post nasal gtt, unable to rest much.  No sputum.  She briefly gets 'woozy' on standing, feels better after eating something.  Also with some L lower posterior thoracic pain, some better now.  Pain with rolling over in the bed.  No fevers.    Allergies: 1)  ! Sulfa 2)  ! Asa  Review of Systems       See HPI.  Otherwise negative.    Physical Exam  General:  GEN: nad, alert and oriented HEENT: mucous membranes moist, TM w/o erythema, nasal epithelium injected with clear rhinorrhea,  OP with cobblestoning but no exudates NECK: supple w/o LA CV: rrr. PULM: ctab, no inc wob, back not tender to palpation  ABD: soft, +bs   Impression & Recommendations:  Problem # 1:  URI (ICD-465.9) Likely viral.  lungs clear.  follow up as needed.  Supportive tx.  should gradually get better, but may take >1wk.  Prevalent in community recently. Okay for outpatient follow up.  She agrees with plan.  Her updated medication list for this problem includes:    Aspirin 325 Mg Tabs (Aspirin) .Marland Kitchen... Take 1 tablet by mouth once a day    Tylenol Extra Strength 500 Mg Tabs (Acetaminophen) .Marland Kitchen... As needed    Tessalon 200 Mg Caps (Benzonatate) .Marland Kitchen... 1 by mouth three times a day for cough  Complete Medication List: 1)  Zantac 150 Mg Tabs (Ranitidine hcl) .Marland Kitchen.. 1 by mouth two times a day 2)   Freestyle Test Strp (Glucose blood) .... Test blood sugar once daily 3)  Zocor 20 Mg Tabs (Simvastatin) .Marland Kitchen.. 1 by mouth once daily 4)  Knee High Compression Stockings 30/40 Mm Hg  .... To wear once daily for venous insufficiency 459.81 5)  Aspirin 325 Mg Tabs (Aspirin) .... Take 1 tablet by mouth once a day 6)  Femara 2.5 Mg Tabs (Letrozole) .Marland Kitchen.. 1 by mouth once daily 7)  Fish Oil 1000 Mg Caps (Omega-3 fatty acids) .... Take 1 capsule by mouth once a day 8)  Miralax Powd (Polyethylene glycol 3350) .... Use as directed as needed 9)  Tylenol Extra Strength 500 Mg Tabs (Acetaminophen) .... As needed 10)  Calcium Carbonate-vitamin D 600-400 Mg-unit Tabs (Calcium carbonate-vitamin d) .... 2 tabs by mouth daily 11)  Preservision/lutein Caps (Multiple vitamins-minerals) .... Take 1 capsule by mouth two times a day 12)  Gabapentin 300 Mg Caps (Gabapentin) .... Take one by mouth in am, and two at bedtime 13)  Tessalon 200 Mg Caps (Benzonatate) .Marland Kitchen.. 1 by mouth three times a day for cough  Patient Instructions: 1)  Get plenty of rest, drink lots of clear liquids, and use Tylenol for fever and comfort.  Use the tessalon as needed for cough.  This should gradually get better.  Take care.  Prescriptions: TESSALON 200 MG CAPS (BENZONATATE) 1 by mouth three times a day for cough  #30 x 1   Entered and Authorized by:   Crawford Givens MD   Signed by:   Crawford Givens MD on 04/14/2010   Method used:   Electronically to        [facility]* (retail)       [address]       [place], [place]  [address]       Ph: [phone]       Fax: [phone]   RxID:   [id number]    Orders Added: 1)  Est. Patient Level III [59563]    Current Allergies (reviewed today): ! SULFA ! ASA

## 2010-06-26 NOTE — Progress Notes (Signed)
Summary: advised pt to go to ER  Phone Note Call from Patient Call back at Home Phone [phone]   Caller: Patient Call For: Judith Part MD Summary of Call: Pt called and attempted to leave a message on my voice mail.  She could only remember her name and that you were her doctor.  I called her number and spoke with her neice who was concerned that pt's BP was 150/88.  I advised neice that pt should go to ER to be evaluated for possible stroke, pt has never sounded like that to me on the phone before- she kept struggling to remember, and could not. Initial call taken by: Lowella Petties CMA,  October 24, 2009 1:14 PM  Follow-up for Phone Call        I agree with above advisement - thanks Follow-up by: Judith Part MD,  October 24, 2009 3:21 PM

## 2010-06-26 NOTE — Progress Notes (Signed)
Summary: rash is better  Phone Note Call from Patient Call back at Home Phone [phone]   Caller: Patient Call For: Dr. Dayton Martes Summary of Call: Pt was seen on friday for a rash and was told to call and report how she is doing. This is better- basically gone. Initial call taken by: Lowella Petties CMA,  August 12, 2009 9:38 AM

## 2010-06-26 NOTE — Medication Information (Signed)
Summary: Heating Pad/[facility]  Heating Pad/[facility]   Imported By: Lanelle Bal 07/02/2009 12:42:32  _____________________________________________________________________  External Attachment:    Type:   Image     Comment:   External Document

## 2010-06-26 NOTE — Progress Notes (Signed)
Summary: Blood sugar readings  Phone Note Call from Patient Call back at [phone]   Caller: Patient Call For: Judith Part MD Summary of Call: Pt brought by blood glucose readings two hours after meal. 04/09/10 3:22pm - 117 04/10/10  4pm  199 - had vegetable plate with cheese and cornbread. 04/11/10  3:10pm 109.     04/12/10 9pm   232  ate hotdog and chili. 04/13/10 at 3:41pm   114.         04/14/10 8;14PM   181. 04/15/10 3:53pm   113.             04/16/10   2:32pm   190. 04/17/10  8:06pm   190  ate Thanksgiving meal.       04/18/10 2:43pm    127. 04/19/10  3:08pm   120.       04/20/10  3:18 pm    125. 04/21/10  9:00pm   227.        04/22/10  3:38pm    196  ate Mary's pie (no sugar). 04/23/10 3:28pm    115.        04/24/10  3:24pm    109. Pt's list is on your shelf if you would like to see that. Initial call taken by: Lewanda Rife LPN,  April 25, 2010 10:12 AM  Follow-up for Phone Call        these are really up and down  ? has she ever done dm teaching classes to learn how to eat dm diet or would she like to?  I do want to start her on small dose of metformin px written on EMR for call in  if any problems/side eff or low sugar call  f/u with me in 4-6 wk with blood sugar log thanks for the info Follow-up by: Judith Part MD,  April 25, 2010 5:08 PM  Additional Follow-up for Phone Call Additional follow up Details #1::        [facility]. Unable to reach pt by phone but spoke with Merrily Brittle. Bonita Quin said pt had not gone to DM classes and she would talk with pt and call back next week to let Dr Milinda Antis know about classes and also to schedule f/u appt.Lewanda Rife LPN  April 25, 2010 5:17 PM     Additional Follow-up for Phone Call Additional follow up Details #2::    Patient notified as instructed by telephone. Pt already scheduled appt to see Dr Milinda Antis on 05-27-10. Pt will think about DM classes and will talk with Dr Milinda Antis on next  visit.Lewanda Rife LPN  April 28, 2010 2:36 PM   New/Updated Medications: METFORMIN HCL 500 MG TABS (METFORMIN HCL) 1 by mouth once daily Prescriptions: METFORMIN HCL 500 MG TABS (METFORMIN HCL) 1 by mouth once daily  #60 x 5   Entered and Authorized by:   Judith Part MD   Signed by:   Lewanda Rife LPN on 11/91/4782   Method used:   Telephoned to ...       [facility]* (retail)       [address]       [place], [place]  [address]       Ph: [phone]       Fax: [phone]   RxID:   [id number]

## 2010-06-26 NOTE — Assessment & Plan Note (Signed)
Summary: LEG PAIN   Vital Signs:  Patient profile:   77 year old female Height:      63.5 inches Weight:      120.25 pounds BMI:     21.04 Temp:     99.8 degrees F oral Pulse rate:   80 / minute Pulse rhythm:   regular BP sitting:   106 / 60  (right arm) Cuff size:   regular  Vitals Entered ByMelody Comas (February 13, 2010 3:14 PM) CC: leg pain    History of Present Illness: L knee pain started yesterday.  Some swelling noted by patient.  Pain with sitting and standing, pain with flexion and going down steps.  No trauma, no falls.  H/o compression stocking use for venous changes.    Allergies: 1)  ! Sulfa 2)  ! Asa  Review of Systems       See HPI.  Otherwise negative.    Physical Exam  General:  NAD MMM RRR  CTAB R leg with decrease in range of motion at knee due to pain.  diffusely puffy with some warmth but no erythema.  tender to palpation on medial and lateral joint line.  No fluctuant mass. distally nv intact.  able to bear weight.  No locking on exam and ligamentously intact.    Impression & Recommendations:  Problem # 1:  DEGENERATIVE JOINT DISEASE (ICD-715.90) likely OA flare.  Unlikely to be gout or other cause with lack of erythema and exam as above.  I would treat per instructions and follow up as needed.  I see no reason to suspect DVT.  She is not tachycardic, in distress, and has no calf tenderness.   The following medications were removed from the medication list:    Hydrocodone-acetaminophen 5-325 Mg Tabs (Hydrocodone-acetaminophen) .Marland Kitchen... Take one tablet by mouth every 6 hours as needed Her updated medication list for this problem includes:    Aspirin 325 Mg Tabs (Aspirin) .Marland Kitchen... Take 1 tablet by mouth once a day    Tylenol Extra Strength 500 Mg Tabs (Acetaminophen) .Marland Kitchen... As needed  Complete Medication List: 1)  Acid Reducer 75 Mg Tabs (Ranitidine hcl) .... One tab by mouth at bedtime 2)  Freestyle Test Strp (Glucose blood) .... Test blood  sugar once daily 3)  Zocor 20 Mg Tabs (Simvastatin) .Marland Kitchen.. 1 by mouth once daily 4)  Knee High Compression Stockings 30/40 Mm Hg  .... To wear once daily for venous insufficiency 459.81 5)  Aspirin 325 Mg Tabs (Aspirin) .... Take 1 tablet by mouth once a day 6)  Femara 2.5 Mg Tabs (Letrozole) .Marland Kitchen.. 1 by mouth once daily 7)  Fish Oil 1000 Mg Caps (Omega-3 fatty acids) .... Take 1 capsule by mouth once a day 8)  Miralax Powd (Polyethylene glycol 3350) .... Use as directed as needed 9)  Tylenol Extra Strength 500 Mg Tabs (Acetaminophen) .... As needed 10)  Calcium Carbonate-vitamin D 600-400 Mg-unit Tabs (Calcium carbonate-vitamin d) .... 2 tabs by mouth daily 11)  Preservision/lutein Caps (Multiple vitamins-minerals) .... Take 1 capsule by mouth two times a day 12)  Gabapentin 300 Mg Caps (Gabapentin) .... Take one by mouth in am, one at noon and two at bedtime  Patient Instructions: 1)  Use an ice pack on your knee.  Put it on for a few minutes and then off for a few minutes.  Repeat the cycle about 3 times.  Do this about 3 times a day.  Take tylenol 325mg , 2 tab by mouth  three times a day as needed for pain.  Rest your knee.  This should get better gradually.  Let us know if you aren't feeling better.  Take care.   Current Allergies (reviewed today): ! SULFA ! ASA

## 2010-06-26 NOTE — Assessment & Plan Note (Signed)
Summary: ?RASH,KNOT ON CHEST/CLE   Vital Signs:  Patient profile:   77 year old female Height:      63.5 inches Weight:      117 pounds BMI:     20.47 Temp:     98.3 degrees F oral Pulse rate:   80 / minute Pulse rhythm:   regular BP sitting:   126 / 64  (left arm) Cuff size:   regular  Vitals Entered By: Delilah Shan CMA Duncan Dull) (August 09, 2009 2:59 PM) CC: ? rash.      CC:  ? rash.   .  History of Present Illness: 77 yo female with 2 days of rash on chest. Noticed rash Wednesday night. Has not spread but she feels it is more itchy. Was just a little red and itchy, now very itchy. Never had anything like this before other than when she had sulfa drug in past. No changes in medications, soaps, detergents or food.  Cat did sit on her lap that day but she has never had allergic reaction to cat before. The cat does go outside frequently. No diffuclty swallowing or swelling of tongue/lips. No wheezing or shortness of breath.  Current Medications (verified): 1)  Acid Reducer 75 Mg Tabs (Ranitidine Hcl) .... One Tab By Mouth At Bedtime 2)  Freestyle Test   Strp (Glucose Blood) .... Test Blood Sugar Once Daily 3)  Zocor 20 Mg  Tabs (Simvastatin) .Marland Kitchen.. 1 By Mouth Qd 4)  Knee High Compression Stockings 30/40 Mm Hg .... To Wear Once Daily For Venous Insufficiency 459.81 5)  Aspirin 325 Mg  Tabs (Aspirin) .... Take 1 Tablet By Mouth Once A Day 6)  Femara 2.5 Mg Tabs (Letrozole) .Marland Kitchen.. 1 By Mouth Once Daily 7)  Fish Oil 1000 Mg Caps (Omega-3 Fatty Acids) .... Take 1 Capsule By Mouth Once A Day 8)  Miralax  Powd (Polyethylene Glycol 3350) .... Use As Directed As Needed 9)  Tylenol Extra Strength 500 Mg Tabs (Acetaminophen) .... As Needed 10)  Calcium Carbonate-Vitamin D 600-400 Mg-Unit  Tabs (Calcium Carbonate-Vitamin D) .... 2 Tabs By Mouth Daily 11)  Multivitamin .Marland Kitchen.. 1 By Mouth Once Daily 12)  Preservision/lutein  Caps (Multiple Vitamins-Minerals) .... Take 1 Capsule By Mouth Two  Times A Day 13)  Fluocinolone Acetonide 0.01 % Crea (Fluocinolone Acetonide) .... Apply To Affected Area 2-4 Times Daily.  Allergies: 1)  ! Sulfa 2)  ! Asa  Review of Systems      See HPI General:  Denies chills and fever. Resp:  Denies shortness of breath and wheezing.  Physical Exam  General:  slim and well appearing Skin:  urticarial like rash on chest, no new lesions on back, abdomen or arms. Psych:  normal affect, talkative and pleasant     Impression & Recommendations:  Problem # 1:  URTICARIA, ACUTE (ICD-708.9) Assessment New  Unknown trigger. Will give lidex cream to apply. If rash worsens, advised benadryl. Advised her to call on Monday to let me know how she is doing, needs to be seen if rash has not improved.  Orders: Prescription Created Electronically [id number])  Complete Medication List: 1)  Acid Reducer 75 Mg Tabs (Ranitidine hcl) .... One tab by mouth at bedtime 2)  Freestyle Test Strp (Glucose blood) .... Test blood sugar once daily 3)  Zocor 20 Mg Tabs (Simvastatin) .Marland Kitchen.. 1 by mouth qd 4)  Knee High Compression Stockings 30/40 Mm Hg  .... To wear once daily for venous insufficiency 459.81 5)  Aspirin  325 Mg Tabs (Aspirin) .... Take 1 tablet by mouth once a day 6)  Femara 2.5 Mg Tabs (Letrozole) .Marland Kitchen.. 1 by mouth once daily 7)  Fish Oil 1000 Mg Caps (Omega-3 fatty acids) .... Take 1 capsule by mouth once a day 8)  Miralax Powd (Polyethylene glycol 3350) .... Use as directed as needed 9)  Tylenol Extra Strength 500 Mg Tabs (Acetaminophen) .... As needed 10)  Calcium Carbonate-vitamin D 600-400 Mg-unit Tabs (Calcium carbonate-vitamin d) .... 2 tabs by mouth daily 11)  Multivitamin  .Marland Kitchen.. 1 by mouth once daily 12)  Preservision/lutein Caps (Multiple vitamins-minerals) .... Take 1 capsule by mouth two times a day 13)  Fluocinolone Acetonide 0.01 % Crea (Fluocinolone acetonide) .... Apply to affected area 2-4 times daily. Prescriptions: FLUOCINOLONE ACETONIDE 0.01  % CREA (FLUOCINOLONE ACETONIDE) Apply to affected area 2-4 times daily.  #30 g x 0   Entered and Authorized by:   Ruthe Mannan MD   Signed by:   Ruthe Mannan MD on 08/09/2009   Method used:   Electronically to        [facility]* (retail)       [address]       [place], [place]  [address]       Ph: [phone]       Fax: [phone]   RxID:   [id number]   Current Allergies (reviewed today): ! SULFA ! ASA

## 2010-06-26 NOTE — Progress Notes (Signed)
Summary: Rx. for Hydrocodone  Phone Note Call from Patient Call back at Home Phone [phone]   Caller: Other Relative, niece, Merrily Brittle Call For: Dr. Alphonsus Sias Summary of Call: At the patient's last OV with Dr. Milinda Antis (11/06/2009) a prescription for Hydrocodone was offered to Ms Quinonez but at that time, her headaches were better and she declined the offer.  Now she is having problems with the headaches again and would like to get a prescription phoned in to [facility].  She was given this upon discharge from the hospital but there were no refills on it. Initial call taken by: Delilah Shan CMA Duncan Dull),  November 08, 2009 9:57 AM  Follow-up for Phone Call        Not documented in OV.Marland KitchenMarland KitchenI don't feel comfortable starting narcotic in pt...we can try other migraine med or have her wait till Dr. Milinda Antis returns.  Follow-up by: Kerby Nora MD,  November 08, 2009 10:12 AM  Additional Follow-up for Phone Call Additional follow up Details #1::        would like something [facility] for [facility]raines  [facility]  Additional Follow-up by: Benny Lennert CMA Duncan Dull),  November 08, 2009 10:18 AM    Additional Follow-up for Phone Call Additional follow up Details #2::    On reviewing note closer and last [facility] discharge summary.Marland Kitchenappears she has taken vicodin before.Marland KitchenMarland KitchenI will prescribe small amount to cover until Dr. Milinda Antis returns and can make recommendation.  Follow-up by: Kerby Nora MD,  November 08, 2009 12:47 PM  Additional Follow-up for Phone Call Additional follow up Details #3:: Details for Additional Follow-up Action Taken: Medicine called to [facility] Additional Follow-up by: Lowella Petties CMA,  November 08, 2009 12:51 PM  Prescriptions: HYDROCODONE-ACETAMINOPHEN 5-325 MG TABS (HYDROCODONE-ACETAMINOPHEN) take one tablet by mouth every 6 hours as needed  #20 x 0   Entered and Authorized by:   Kerby Nora MD   Signed by:   Kerby Nora MD on 11/08/2009   Method used:   Telephoned to ...       [facility]* (retail)       [address]       [place], [place]  [address]       Ph: [phone]       Fax: [phone]   RxID:   [id number]

## 2010-06-26 NOTE — Letter (Signed)
Summary: [facility]   Imported By: Lanelle Bal 01/29/2010 14:00:53  _____________________________________________________________________  External Attachment:    Type:   Image     Comment:   External Document

## 2010-06-26 NOTE — Letter (Signed)
Summary: [facility]  [facility]   Imported By: Lanelle Bal 03/20/2010 14:03:21  _____________________________________________________________________  External Attachment:    Type:   Image     Comment:   External Document

## 2010-06-26 NOTE — Letter (Signed)
Summary: [facility]   Imported By: Lanelle Bal 06/13/2009 09:26:14  _____________________________________________________________________  External Attachment:    Type:   Image     Comment:   External Document

## 2010-06-26 NOTE — Letter (Signed)
[facility]   Imported By: Lanelle Bal 08/02/2009 14:18:44  _____________________________________________________________________  External Attachment:    Type:   Image     Comment:   External Document

## 2010-06-26 NOTE — Progress Notes (Signed)
Summary: ? [employer]/diabetes supply company  Phone Note Call from Patient Call back at Home Phone [phone]   Caller: Patient Call For: Judith Part MD Summary of Call: Patient called our office concerned because she has given out her Medicare number over the phone to a company named [employer].  Says that they are a diabetes testing supply supplier.  She wants [place] to be on the look out for any paper work that they may send [place] and let her know.  Wants to make sure they are legit.  She said its been years since she had a new glucose meter and wouldn't mind having a new one.   Initial call taken by: Linde Gillis CMA Duncan Dull),  June 19, 2009 3:02 PM

## 2010-06-26 NOTE — Letter (Signed)
Summary: [facility]  [facility]   Imported By: Maryln Gottron 12/20/2009 11:31:15  _____________________________________________________________________  External Attachment:    Type:   Image     Comment:   External Document

## 2010-06-26 NOTE — Assessment & Plan Note (Signed)
Summary: 6 MONTH FOLLOW UP/[facility]   Vital Signs:  Patient profile:   77 year old female Height:      63.5 inches Weight:      120.25 pounds BMI:     21.04 Temp:     97.9 degrees F oral Pulse rate:   88 / minute Pulse rhythm:   regular BP sitting:   128 / 72  (left arm) Cuff size:   regular  Vitals Entered By: Lewanda Rife LPN (April 08, 2010 8:53 AM) CC: six month f/u   History of Present Illness: here for f/u of HTN and DM and lipids  is feeling good   yesterday her stomach hurt - better now -- ? ate something unusual left over chicken pie  stomach aches are on and off -- ? due to aspirin   has seen Dr Vela Prose for headaches - tension type  doing better on gabapentin -- is thankful for that  has not needed any tylenol - this is thankful   DM - due for labs has been very well controlled   breast ca f/u on femara   due for lipid labs also good control   bp is good 128/72  on asa for prior tia-- no more symptoms at all     Allergies: 1)  ! Sulfa 2)  ! Jonne Ply  Past History:  Past Surgical History: Last updated: 10/19/2008 Breast cyst- drained Heel dexa screen- normal (03/2000) Dexa- mild osteopenia (02/2001) Eye surgery- retinal, then cataract Colonoscopy- polyps, diverticulitis, hemorrhoids (1998) Colonoscopy- diverticulitis, hem (05/2002) Dexa- no change (07/2003) 5/09 L breast mass - bx show invasive ductal carcinoma (pend eval Dr Ezzard Standing) lumpectomy with sentinel LN biopsy 5/09 7/07 bone [facility] neg at [facility]  Family History: Last updated: 09/10/2008 2 sisters with breast cancer  mother ? TIA or stroker   Social History: Last updated: 09/10/2008 lives with and cares for both sisters  non smoker no alcohol strong faith   Risk Factors: Smoking Status: never (10/06/2007)  Past Medical History: DM 2--mild retinopathy gout (injection in past ) fibrocystic breasts  venous insufficiency hyperlipidemia  breast cancer TIA 4/10 with neg MRI/MRA and CT and  carotid dopplers chronic constipation gastritis  chronic headaches   opthy- Dr Ashley Royalty podiatry- --Dr Kristine Garbe -- [place]   Review of Systems General:  Denies fatigue, loss of appetite, and malaise. Eyes:  Denies blurring and eye irritation. CV:  Denies chest pain or discomfort, lightheadness, and palpitations. Resp:  Denies cough, pleuritic, and shortness of breath. GI:  Complains of abdominal pain and indigestion; denies change in bowel habits, dark tarry stools, diarrhea, nausea, and vomiting. Derm:  Denies itching, lesion(s), poor wound healing, and rash. Neuro:  Complains of headaches; denies numbness and tingling. Psych:  Denies anxiety and depression. Endo:  Denies cold intolerance, excessive thirst, excessive urination, and heat intolerance. Heme:  Denies abnormal bruising and bleeding.  Physical Exam  General:  slim , elderly and slt fral appearing  Head:  normocephalic, atraumatic, and no abnormalities observed.   Eyes:  vision grossly intact, pupils equal, pupils round, and pupils reactive to light.  no conjunctival pallor, injection or icterus  Mouth:  pharynx pink and moist.   Neck:  supple with full rom and no masses or thyromegally, no JVD or carotid bruit  Lungs:  Normal respiratory effort, chest expands symmetrically. Lungs are clear to auscultation, no crackles or wheezes. Heart:  Normal rate and regular rhythm. S1 and S2 normal without gallop, murmur, click, rub  or other extra sounds. Abdomen:  mild epigastric tenderness without rebound or gaurding  Msk:  No deformity or scoliosis noted of thoracic or lumbar spine.  no acute joint changes  Extremities:  no cce varicosities noted  Neurologic:  sensation intact to light touch, gait normal, and DTRs symmetrical and normal.   Skin:  Intact without suspicious lesions or rashes Cervical Nodes:  No lymphadenopathy noted Psych:  normal affect, talkative and pleasant   Diabetes Management Exam:    Foot Exam  (with socks and/or shoes not present):       Sensory-Pinprick/Light touch:          Left medial foot (L-4): normal          Left dorsal foot (L-5): normal          Left lateral foot (S-1): normal          Right medial foot (L-4): normal          Right dorsal foot (L-5): normal          Right lateral foot (S-1): normal       Sensory-Monofilament:          Left foot: normal          Right foot: normal       Inspection:          Left foot: normal          Right foot: normal       Nails:          Left foot: normal          Right foot: normal   Impression & Recommendations:  Problem # 1:  MIGRAINE VARIANT (ICD-346.20) Assessment Improved headaches much better on gabapentin- will continue f/u with Dr Vela Prose Her updated medication list for this problem includes:    Aspirin 325 Mg Tabs (Aspirin) .Marland Kitchen... Take 1 tablet by mouth once a day    Tylenol Extra Strength 500 Mg Tabs (Acetaminophen) .Marland Kitchen... As needed  Problem # 2:  TIA (ICD-435.9) Assessment: Improved no more occurances continue asa will bump the zantac for Gi protection  Her updated medication list for this problem includes:    Aspirin 325 Mg Tabs (Aspirin) .Marland Kitchen... Take 1 tablet by mouth once a day  Problem # 3:  DIABETES MELLITUS, TYPE II (ICD-250.00) Assessment: Unchanged  per pt stable  lab today  is good about low glycemic diet  if all is good - more lab and f/u in 6 mo  Her updated medication list for this problem includes:    Aspirin 325 Mg Tabs (Aspirin) .Marland Kitchen... Take 1 tablet by mouth once a day  Orders: Venipuncture ([address]109) TLB-Lipid Panel (80061-LIPID) TLB-Renal Function Panel (80069-RENAL) TLB-ALT (SGPT) (84460-ALT) TLB-AST (SGOT) (84450-SGOT) TLB-A1C / Hgb A1C (Glycohemoglobin) (83036-A1C)  Labs Reviewed: Creat: 0.7 (10/01/2009)     Last Eye Exam: normal (01/29/2009) Reviewed HgBA1c results: 6.7 (10/01/2009)  6.7 (04/10/2009)  Problem # 4:  HYPERCHOLESTEROLEMIA, PURE (ICD-272.0) Assessment:  Unchanged  lab today on zocor and diet  rev low sat fat diet- does well with this  Her updated medication list for this problem includes:    Zocor 20 Mg Tabs (Simvastatin) .Marland Kitchen... 1 by mouth once daily  Orders: Venipuncture (60454) TLB-Lipid Panel (80061-LIPID) TLB-Renal Function Panel (80069-RENAL) TLB-ALT (SGPT) (84460-ALT) TLB-AST (SGOT) (84450-SGOT) TLB-A1C / Hgb A1C (Glycohemoglobin) (83036-A1C)  Labs Reviewed: SGOT: 22 (10/01/2009)   SGPT: 17 (10/01/2009)   HDL:35.30 (10/01/2009), 44.20 (04/10/2009)  LDL:69 (04/10/2009), 67 (10/04/2008)  Chol:128 (10/01/2009), 136 (  04/10/2009)  Trig:224.0 (10/01/2009), 116.0 (04/10/2009)  Problem # 5:  GASTRITIS (ICD-535.50) Assessment: New  poss from asa not well controlled on zantac 75 once daily will inc to 150 two times a day  if not imp -- consider ppi will update if not better in 2 wk Her updated medication list for this problem includes:    Zantac 150 Mg Tabs (Ranitidine hcl) .Marland Kitchen... 1 by mouth two times a day  Orders: Prescription Created Electronically [id number])  Complete Medication List: 1)  Zantac 150 Mg Tabs (Ranitidine hcl) .Marland Kitchen.. 1 by mouth two times a day 2)  Freestyle Test Strp (Glucose blood) .... Test blood sugar once daily 3)  Zocor 20 Mg Tabs (Simvastatin) .Marland Kitchen.. 1 by mouth once daily 4)  Knee High Compression Stockings 30/40 Mm Hg  .... To wear once daily for venous insufficiency 459.81 5)  Aspirin 325 Mg Tabs (Aspirin) .... Take 1 tablet by mouth once a day 6)  Femara 2.5 Mg Tabs (Letrozole) .Marland Kitchen.. 1 by mouth once daily 7)  Fish Oil 1000 Mg Caps (Omega-3 fatty acids) .... Take 1 capsule by mouth once a day 8)  Miralax Powd (Polyethylene glycol 3350) .... Use as directed as needed 9)  Tylenol Extra Strength 500 Mg Tabs (Acetaminophen) .... As needed 10)  Calcium Carbonate-vitamin D 600-400 Mg-unit Tabs (Calcium carbonate-vitamin d) .... 2 tabs by mouth daily 11)  Preservision/lutein Caps (Multiple vitamins-minerals) .... Take  1 capsule by mouth two times a day 12)  Gabapentin 300 Mg Caps (Gabapentin) .... Take one by mouth in am, and two at bedtime  Patient Instructions: 1)  stop the ranitidine acid reducer 75  2)  I am going to increase your dose to 150 mg two times a day - and will do that by px  3)  update me within 2-3 weeks if stomach pain does not go away- we may need to go to a stronger med 4)  otherwise no med changes 5)  labs today  6)  schedule fasting labs and then 30 min check up in 6 months lipid/ vit D / hepatic/ cbc with diff / renal panel / AIC 250.0, 272, 733.0 and gastritis  Prescriptions: ZANTAC 150 MG TABS (RANITIDINE HCL) 1 by mouth two times a day  #60 x 11   Entered and Authorized by:   Judith Part MD   Signed by:   Judith Part MD on 04/08/2010   Method used:   Electronically to        [facility]* (retail)       [address]       [place], [place]  [address]       Ph: [phone]       Fax: [phone]   RxID:   [id number]    Orders Added: 1)  Venipuncture [64403] 2)  TLB-Lipid Panel [80061-LIPID] 3)  TLB-Renal Function Panel [80069-RENAL] 4)  TLB-ALT (SGPT) [84460-ALT] 5)  TLB-AST (SGOT) [84450-SGOT] 6)  TLB-A1C / Hgb A1C (Glycohemoglobin) [83036-A1C] 7)  Prescription Created Electronically [G8553] 8)  Est. Patient Level IV [47425]    Current Allergies (reviewed today): ! SULFA ! ASA

## 2010-06-26 NOTE — Medication Information (Signed)
Summary: Diabetes Supplies/[facility]  [facility]   Imported By: Lanelle Bal 06/26/2009 13:20:15  _____________________________________________________________________  External Attachment:    Type:   Image     Comment:   External Document

## 2010-06-26 NOTE — Letter (Signed)
Summary: Appt Scheduled/[facility]  Appt Scheduled/[facility]   Imported By: Lanelle Bal 06/09/2010 15:15:07  _____________________________________________________________________  External Attachment:    Type:   Image     Comment:   External Document

## 2010-06-26 NOTE — Assessment & Plan Note (Signed)
Summary: 30 minute hosp. f/u ([facility]   Vital Signs:  Patient profile:   77 year old female Height:      63.5 inches Weight:      117.25 pounds BMI:     20.52 Temp:     97.9 degrees F oral Pulse rate:   76 / minute Pulse rhythm:   regular BP sitting:   142 / 78  (right arm) Cuff size:   regular  Vitals Entered By: Lewanda Rife LPN (November 06, 2009 12:18 PM) CC: f/u from hospitalization at [facility]   History of Present Illness: here for f/u hosp-- june 2nd -- very hot and humid day -- could have been hot or dehydrated  was hosp for atypical migraine with dysarthria and pressure in head (not sharp)  had neg ct/ MRI/ echo , carotid doppers and not thought to be TIA  sympt resolved quickly   head and ears are still bothering her  ears feel closed up and pressure  little to no nasal or sinus problems , no fever/ malaise   now is feeling pressure in back of head -- usually bilateral (was unilateral in hosp for a while)  no throbbing  at one point was nauseated -- and better now  light does not bother her and no vision changes    has had headaches in the past  moreso in college and HS and less as she had gotten older     Allergies: 1)  ! Sulfa 2)  ! Jonne Ply  Past History:  Past Medical History: Last updated: 10/18/2008 DM 2--mild retinopathy gout (injection in past ) fibrocystic breasts  venous insufficiency hyperlipidemia  breast cancer TIA 4/10 with neg MRI/MRA and CT and carotid dopplers chronic constipation   opthy- Dr Ashley Royalty podiatry- --Dr Charlsie Merles   Past Surgical History: Last updated: 10/19/2008 Breast cyst- drained Heel dexa screen- normal (03/2000) Dexa- mild osteopenia (02/2001) Eye surgery- retinal, then cataract Colonoscopy- polyps, diverticulitis, hemorrhoids (1998) Colonoscopy- diverticulitis, hem (05/2002) Dexa- no change (07/2003) 5/09 L breast mass - bx show invasive ductal carcinoma (pend eval Dr Ezzard Standing) lumpectomy with sentinel LN biopsy  5/09 7/07 bone scan neg at [facility]  Family History: Last updated: 09/10/2008 2 sisters with breast cancer  mother ? TIA or stroker   Social History: Last updated: 09/10/2008 lives with and cares for both sisters  non smoker no alcohol strong faith   Risk Factors: Smoking Status: never (10/06/2007)  Review of Systems General:  Denies fatigue, loss of appetite, and malaise. Eyes:  Denies blurring, double vision, eye irritation, eye pain, halos, and light sensitivity. ENT:  Complains of earache; denies ear discharge, nosebleeds, postnasal drainage, and sore throat. CV:  Denies chest pain or discomfort, palpitations, shortness of breath with exertion, and swelling of feet. Resp:  Denies cough, shortness of breath, sputum productive, and wheezing. GI:  Denies abdominal pain, bloody stools, change in bowel habits, indigestion, nausea, and vomiting. GU:  Denies dysuria and urinary frequency. MS:  Complains of stiffness; denies muscle aches; has had cramps in her toes for years . Derm:  Denies itching, lesion(s), poor wound healing, and rash. Neuro:  Denies numbness and tingling. Psych:  Denies anxiety, depression, panic attacks, and sense of great danger. Endo:  Complains of heat intolerance; denies cold intolerance, excessive thirst, and excessive urination. Heme:  Denies abnormal bruising and bleeding.  Physical Exam  General:  slim and well appearing Head:  normocephalic, atraumatic, and no abnormalities observed.  no sinus or temporal tenderness Eyes:  vision grossly intact, pupils equal, pupils round, and pupils reactive to light.  no conjunctival pallor, injection or icterus  Ears:  R ear normal and L ear normal.   Nose:  no nasal discharge.   Mouth:  pharynx pink and moist.   Neck:  supple with full rom and no masses or thyromegally, no JVD or carotid bruit  Chest Wall:  No deformities, masses, or tenderness noted. Lungs:  Normal respiratory effort, chest expands symmetrically.  Lungs are clear to auscultation, no crackles or wheezes. Heart:  Normal rate and regular rhythm. S1 and S2 normal without gallop, murmur, click, rub or other extra sounds. Abdomen:  Bowel sounds positive,abdomen soft and non-tender without masses, organomegaly or hernias noted. no renal bruits  Msk:  No deformity or scoliosis noted of thoracic or lumbar spine.  no acute joint changes  Pulses:  R and L carotid,radial,femoral,dorsalis pedis and posterior tibial pulses are full and equal bilaterally Extremities:  no cce varicosities noted  Neurologic:  alert & oriented X3, cranial nerves II-XII intact, strength normal in all extremities, sensation intact to light touch, gait normal, DTRs symmetrical and normal, finger-to-nose normal, toes down bilaterally on Babinski, and Romberg negative.   Skin:  Intact without suspicious lesions or rashes Cervical Nodes:  No lymphadenopathy noted Psych:  normal affect, talkative and pleasant  is baseline mildly anxious - no change from nl    Impression & Recommendations:  Problem # 1:  MIGRAINE VARIANT (ICD-346.20) Assessment New s/p hosp for dysarthria  rev hosp d/c summary and studies in detail with pt today better but still some discomfort [facility] eval of atypical migraine in elderly female  disc lifestyle change for headache - hydration/ caff avoidance/ good sleep habits  ? as to whether prev "tia" could have also been a migraine in past  Her updated medication list for this problem includes:    Aspirin 325 Mg Tabs (Aspirin) .Marland Kitchen... Take 1 tablet by mouth once a day    Tylenol Extra Strength 500 Mg Tabs (Acetaminophen) .Marland Kitchen... As needed    Hydrocodone-acetaminophen 5-325 Mg Tabs (Hydrocodone-acetaminophen) .Marland Kitchen... Take one tablet by mouth every 6 hours as needed  Orders: Headache Clinic Referral (Headache)  Problem # 2:  DIABETES MELLITUS, TYPE II (ICD-250.00) Assessment: Unchanged  has been stable and  well controlled  Her updated medication list for this problem includes:    Aspirin 325 Mg Tabs (Aspirin) .Marland Kitchen... Take 1 tablet by mouth once a day  Labs Reviewed: Creat: 0.7 (10/01/2009)     Last Eye Exam: normal (01/29/2009) Reviewed HgBA1c results: 6.7 (10/01/2009)  6.7 (04/10/2009)  Complete Medication List: 1)  Acid Reducer 75 Mg Tabs (Ranitidine hcl) .... One tab by mouth at bedtime 2)  Freestyle Test Strp (Glucose blood) .... Test blood sugar once daily 3)  Zocor 20 Mg Tabs (Simvastatin) .Marland Kitchen.. 1 by mouth once daily 4)  Knee High Compression Stockings 30/40 Mm Hg  .... To wear once daily for venous insufficiency 459.81 5)  Aspirin 325 Mg Tabs (Aspirin) .... Take 1 tablet by mouth once a day 6)  Femara 2.5 Mg Tabs (Letrozole) .Marland Kitchen.. 1 by mouth once daily 7)  Fish Oil 1000 Mg Caps (Omega-3 fatty acids) .... Take 1 capsule by mouth once a day 8)  Miralax Powd (Polyethylene glycol 3350) .... Use as directed as needed 9)  Tylenol Extra Strength 500 Mg Tabs (Acetaminophen) .... As needed 10)  Calcium Carbonate-vitamin D 600-400 Mg-unit Tabs (Calcium carbonate-vitamin  d) .... 2 tabs by mouth daily 11)  Preservision/lutein Caps (Multiple vitamins-minerals) .... Take 1 capsule by mouth two times a day 12)  Hydrocodone-acetaminophen 5-325 Mg Tabs (Hydrocodone-acetaminophen) .... Take one tablet by mouth every 6 hours as needed  Patient Instructions: 1)  we will refer you for headache clinic at check out  2)  please update me if symptoms worsen or re- occur - or if fever or other symptoms  3)  do get in touch with your dentist about tooth problem  4)  make sure to drink lots of fluids and avoid caffine  5)  try to keep sleep times regular - stay on a regular schedule   Current Allergies (reviewed today): ! SULFA ! ASA

## 2010-06-26 NOTE — Assessment & Plan Note (Signed)
Summary: F/U FOR DIABETES / [facility]FW   Vital Signs:  Patient profile:   77 year old female Weight:      116.25 pounds BMI:     20.34 Temp:     97.5 degrees F oral Pulse rate:   80 / minute Pulse rhythm:   regular BP sitting:   110 / 60  (left arm) Cuff size:   regular  Vitals Entered By: Selena Batten Dance CMA Duncan Dull) (May 27, 2010 2:04 PM) CC: Follow up on blood sugars   History of Present Illness: here for f/u of DM  wt is down 2 lb with bmi 20 sugars were severely up and down started on once daily metformin last AIC 7.1 up from 6.7   is doing well with the metformin -- is taking after breakfast  these sugars already look better than before  is trying to cut down on bread   she has fair understanding of good DM diet  perhaps not getting enough protein  not a lot of meat in general - some chicken and fish  not a lot of nuts - hurt her stomach  some dairy  , some cheeze  egg substitue and boiled eggs   is up and around all the time  does not walk for the sake of walking like she used to   am sugars 90s  after a meal - usually below 150s - with occasional highs   does eat sweets -- but not a lot at all   compression stockings for varicose veins are too hard to put on and they squeeze her toes she knows she needs to wear them ? can we change anything ?  Allergies: 1)  ! Sulfa 2)  ! Jonne Ply  Past History:  Past Medical History: Last updated: 04/08/2010 DM 2--mild retinopathy gout (injection in past ) fibrocystic breasts  venous insufficiency hyperlipidemia  breast cancer TIA 4/10 with neg MRI/MRA and CT and carotid dopplers chronic constipation gastritis  chronic headaches   opthy- Dr Ashley Royalty podiatry- --Dr Kristine Garbe -- [place]   Past Surgical History: Last updated: 10/19/2008 Breast cyst- drained Heel dexa screen- normal (03/2000) Dexa- mild osteopenia (02/2001) Eye surgery- retinal, then cataract Colonoscopy- polyps, diverticulitis, hemorrhoids  (1998) Colonoscopy- diverticulitis, hem (05/2002) Dexa- no change (07/2003) 5/09 L breast mass - bx show invasive ductal carcinoma (pend eval Dr Ezzard Standing) lumpectomy with sentinel LN biopsy 5/09 7/07 bone scan neg at [facility]  Family History: Last updated: 09/10/2008 2 sisters with breast cancer  mother ? TIA or stroker   Social History: Last updated: 09/10/2008 lives with and cares for both sisters  non smoker no alcohol strong faith   Risk Factors: Smoking Status: never (10/06/2007)  Review of Systems General:  Denies fatigue, fever, loss of appetite, and malaise. Eyes:  Denies blurring and eye pain. CV:  Complains of swelling of feet; denies chest pain or discomfort, leg cramps with exertion, and palpitations. Resp:  Denies shortness of breath and wheezing. GI:  Denies abdominal pain, indigestion, and nausea. GU:  Denies urinary frequency. MS:  Complains of joint pain and stiffness; denies joint redness and joint swelling. Derm:  Denies itching, lesion(s), poor wound healing, and rash. Neuro:  Denies numbness and tingling. Psych:  mood is ok . Endo:  Denies excessive thirst and excessive urination. Heme:  Denies abnormal bruising and bleeding.  Physical Exam  General:  elderly female in no distress/ slim Head:  normocephalic, atraumatic, and no abnormalities observed.   Eyes:  vision grossly intact, pupils equal, pupils round, and pupils reactive to light.   Neck:  supple with full rom and no masses or thyromegally, no JVD or carotid bruit  Chest Wall:  No deformities, masses, or tenderness noted. Lungs:  Normal respiratory effort, chest expands symmetrically. Lungs are clear to auscultation, no crackles or wheezes. Heart:  Normal rate and regular rhythm. S1 and S2 normal without gallop, murmur, click, rub or other extra sounds. Abdomen:  soft, non-tender, and normal bowel sounds.  no renal bruits  Pulses:  R and L carotid,radial,femoral,dorsalis pedis and posterior tibial  pulses are full and equal bilaterally Extremities:  no cce varicosities noted  Neurologic:  sensation intact to light touch, gait normal, and DTRs symmetrical and normal.   Skin:  Intact without suspicious lesions or rashes Cervical Nodes:  No lymphadenopathy noted Psych:  normal affect, talkative and pleasant   Diabetes Management Exam:    Foot Exam (with socks and/or shoes not present):       Sensory-Pinprick/Light touch:          Left medial foot (L-4): normal          Left dorsal foot (L-5): normal          Left lateral foot (S-1): normal          Right medial foot (L-4): normal          Right dorsal foot (L-5): normal          Right lateral foot (S-1): normal       Sensory-Monofilament:          Left foot: normal          Right foot: normal       Inspection:          Left foot: normal          Right foot: normal       Nails:          Left foot: normal          Right foot: normal   Impression & Recommendations:  Problem # 1:  DIABETES MELLITUS, TYPE II (ICD-250.00) Assessment Improved  this is improving with one am dose of metformin I feet diet ed may help too will ref to DM teaching  disc sources of protien and portion sizes  lab and f/u 3 mo  Her updated medication list for this problem includes:    Aspirin 325 Mg Tabs (Aspirin) .Marland Kitchen... Take 1 tablet by mouth once a day    Metformin Hcl 500 Mg Tabs (Metformin hcl) .Marland Kitchen... 1 by mouth once daily  Orders: Diabetic Clinic Referral (Diabetic)  Labs Reviewed: Creat: 0.8 (04/08/2010)     Last Eye Exam: normal (01/29/2009) Reviewed HgBA1c results: 7.1 (04/08/2010)  6.7 (10/01/2009)  Problem # 2:  VENOUS INSUFFICIENCY (ICD-459.81) Assessment: Unchanged pt having toe pain and trouble getting stockings on  will change to lower compression and toeless -- 15-20 mm Hg  update if not imp  Complete Medication List: 1)  Zantac 150 Mg Tabs (Ranitidine hcl) .Marland Kitchen.. 1 by mouth two times a day 2)  Freestyle Test Strp (Glucose  blood) .... Test blood sugar once daily 3)  Zocor 20 Mg Tabs (Simvastatin) .Marland Kitchen.. 1 by mouth once daily 4)  Knee High Compression Stockings 15-20 Mmhg Toeless  .... To wear once daily for venous insufficiency 459.81 5)  Aspirin 325 Mg Tabs (Aspirin) .... Take 1 tablet by mouth once a day 6)  Femara 2.5 Mg Tabs (  Letrozole) .Marland Kitchen.. 1 by mouth once daily 7)  Fish Oil 1000 Mg Caps (Omega-3 fatty acids) .... Take 1 capsule by mouth once a day 8)  Miralax Powd (Polyethylene glycol 3350) .... Use as directed as needed 9)  Tylenol Extra Strength 500 Mg Tabs (Acetaminophen) .... As needed 10)  Calcium Carbonate-vitamin D 600-400 Mg-unit Tabs (Calcium carbonate-vitamin d) .... 2 tabs by mouth daily 11)  Preservision/lutein Caps (Multiple vitamins-minerals) .... Take 1 capsule by mouth two times a day 12)  Gabapentin 300 Mg Caps (Gabapentin) .... Take one by mouth in am, and two at bedtime 13)  Metformin Hcl 500 Mg Tabs (Metformin hcl) .Marland Kitchen.. 1 by mouth once daily  Patient Instructions: 1)  continue the metformin 2)  we will do referral to diabetic teaching at check out  3)  please try compression hose - with less compression  4)  schedule labs for AIC , renal 250.0 and then follow up  Prescriptions: KNEE HIGH COMPRESSION STOCKINGS 15-20 MMHG TOELESS to wear once daily for venous insufficiency 459.81  #1 pair x 3   Entered and Authorized by:   Judith Part MD   Signed by:   Judith Part MD on 05/27/2010   Method used:   Print then Give to Patient   RxID:   [id number]    Orders Added: 1)  Diabetic Clinic Referral [Diabetic] 2)  Est. Patient Level IV [56213]    Current Allergies (reviewed today): ! SULFA ! ASA

## 2010-06-26 NOTE — Medication Information (Signed)
Summary: [facility]  [facility]   Imported By: Lanelle Bal 06/28/2009 10:15:43  _____________________________________________________________________  External Attachment:    Type:   Image     Comment:   External Document

## 2010-06-26 NOTE — Assessment & Plan Note (Signed)
Summary: flu shot/dlo   Nurse Visit   Allergies: 1)  ! Sulfa 2)  ! Asa  Immunizations Administered:  Influenza Vaccine # 1:    Vaccine Type: Fluvax MCR    Site: left deltoid    Mfr: GlaxoSmithKline    Dose: 0.5 ml    Route: IM    Given by: Mervin Hack CMA (AAMA)    Exp. Date: 11/22/2010    Lot #: [id number]    VIS given: 12/17/09 version given February 21, 2010.  Flu Vaccine Consent Questions:    Do you have a history of severe allergic reactions to this vaccine? no    Any prior history of allergic reactions to egg and/or gelatin? no    Do you have a sensitivity to the preservative Thimersol? no    Do you have a past history of Guillan-Barre Syndrome? no    Do you currently have an acute febrile illness? no    Have you ever had a severe reaction to latex? no    Vaccine information given and explained to patient? yes    Are you currently pregnant? no  Orders Added: 1)  Flu Vaccine 36yrs + MEDICARE PATIENTS [Q2039] 2)  Administration Flu vaccine - MCR [G0008]

## 2010-06-26 NOTE — Medication Information (Signed)
Summary: [facility]  [facility]   Imported By: Lanelle Bal 07/02/2009 12:43:47  _____________________________________________________________________  External Attachment:    Type:   Image     Comment:   External Document

## 2010-07-07 ENCOUNTER — Encounter: Payer: Self-pay | Admitting: Family Medicine

## 2010-07-08 ENCOUNTER — Encounter: Payer: Self-pay | Admitting: Family Medicine

## 2010-07-09 ENCOUNTER — Encounter: Payer: Medicare Other | Attending: Family Medicine

## 2010-07-09 ENCOUNTER — Encounter: Admit: 2010-07-09 | Payer: Self-pay | Admitting: Family Medicine

## 2010-07-09 DIAGNOSIS — E119 Type 2 diabetes mellitus without complications: Secondary | ICD-10-CM | POA: Insufficient documentation

## 2010-07-10 ENCOUNTER — Encounter: Payer: Self-pay | Admitting: Family Medicine

## 2010-07-10 NOTE — Letter (Signed)
Summary: Dr Ezzard Standing note  Dr Ezzard Standing note   Imported By: Kassie Mends 07/02/2010 08:30:10  _____________________________________________________________________  External Attachment:    Type:   Image     Comment:   External Document

## 2010-07-17 ENCOUNTER — Other Ambulatory Visit: Payer: Self-pay | Admitting: Oncology

## 2010-07-17 ENCOUNTER — Encounter (HOSPITAL_BASED_OUTPATIENT_CLINIC_OR_DEPARTMENT_OTHER): Payer: Medicare Other | Admitting: Oncology

## 2010-07-17 DIAGNOSIS — C50919 Malignant neoplasm of unspecified site of unspecified female breast: Secondary | ICD-10-CM

## 2010-07-17 DIAGNOSIS — M810 Age-related osteoporosis without current pathological fracture: Secondary | ICD-10-CM

## 2010-07-17 LAB — CBC WITH DIFFERENTIAL/PLATELET
BASO%: 1 % (ref 0.0–2.0)
Eosinophils Absolute: 0.3 10*3/uL (ref 0.0–0.5)
LYMPH%: 17.6 % (ref 14.0–49.7)
MCHC: 33 g/dL (ref 31.5–36.0)
MONO#: 0.9 10*3/uL (ref 0.1–0.9)
MONO%: 7.2 % (ref 0.0–14.0)
NEUT#: 8.9 10*3/uL — ABNORMAL HIGH (ref 1.5–6.5)
Platelets: 188 10*3/uL (ref 145–400)
RBC: 4.24 10*6/uL (ref 3.70–5.45)
RDW: 14.9 % — ABNORMAL HIGH (ref 11.2–14.5)
WBC: 12.4 10*3/uL — ABNORMAL HIGH (ref 3.9–10.3)
nRBC: 0 % (ref 0–0)

## 2010-07-18 LAB — COMPREHENSIVE METABOLIC PANEL
ALT: 10 U/L (ref 0–35)
AST: 14 U/L (ref 0–37)
Albumin: 4.1 g/dL (ref 3.5–5.2)
Calcium: 10.5 mg/dL (ref 8.4–10.5)
Chloride: 102 mEq/L (ref 96–112)
Creatinine, Ser: 1.13 mg/dL (ref 0.40–1.20)
Potassium: 4.9 mEq/L (ref 3.5–5.3)

## 2010-07-22 NOTE — Letter (Signed)
Summary: [facility]   [facility]   Imported By: Kassie Mends 07/16/2010 09:28:20  _____________________________________________________________________  External Attachment:    Type:   Image     Comment:   External Document

## 2010-07-24 ENCOUNTER — Encounter (HOSPITAL_BASED_OUTPATIENT_CLINIC_OR_DEPARTMENT_OTHER): Payer: Medicare Other | Admitting: Oncology

## 2010-07-24 ENCOUNTER — Encounter: Payer: Self-pay | Admitting: Family Medicine

## 2010-07-24 ENCOUNTER — Other Ambulatory Visit: Payer: Self-pay | Admitting: Oncology

## 2010-07-24 DIAGNOSIS — C50919 Malignant neoplasm of unspecified site of unspecified female breast: Secondary | ICD-10-CM

## 2010-07-24 DIAGNOSIS — M810 Age-related osteoporosis without current pathological fracture: Secondary | ICD-10-CM

## 2010-07-24 DIAGNOSIS — Z170 Estrogen receptor positive status [ER+]: Secondary | ICD-10-CM

## 2010-07-24 DIAGNOSIS — Z1231 Encounter for screening mammogram for malignant neoplasm of breast: Secondary | ICD-10-CM

## 2010-07-31 NOTE — Letter (Signed)
Summary: [facility]  [facility]   Imported By: Kassie Mends 07/23/2010 08:24:53  _____________________________________________________________________  External Attachment:    Type:   Image     Comment:   External Document

## 2010-08-06 ENCOUNTER — Emergency Department (HOSPITAL_COMMUNITY): Payer: Medicare Other

## 2010-08-06 ENCOUNTER — Inpatient Hospital Stay (HOSPITAL_COMMUNITY)
Admission: EM | Admit: 2010-08-06 | Discharge: 2010-08-11 | DRG: 536 | Disposition: A | Discharge status: Skilled Nursing Facility | Payer: Medicare Other | Attending: Internal Medicine | Admitting: Internal Medicine

## 2010-08-06 DIAGNOSIS — E119 Type 2 diabetes mellitus without complications: Secondary | ICD-10-CM | POA: Diagnosis present

## 2010-08-06 DIAGNOSIS — Y92009 Unspecified place in unspecified non-institutional (private) residence as the place of occurrence of the external cause: Secondary | ICD-10-CM

## 2010-08-06 DIAGNOSIS — Z853 Personal history of malignant neoplasm of breast: Secondary | ICD-10-CM

## 2010-08-06 DIAGNOSIS — S71109A Unspecified open wound, unspecified thigh, initial encounter: Secondary | ICD-10-CM | POA: Diagnosis present

## 2010-08-06 DIAGNOSIS — R296 Repeated falls: Secondary | ICD-10-CM | POA: Diagnosis present

## 2010-08-06 DIAGNOSIS — S32509A Unspecified fracture of unspecified pubis, initial encounter for closed fracture: Principal | ICD-10-CM | POA: Diagnosis present

## 2010-08-06 DIAGNOSIS — S0003XA Contusion of scalp, initial encounter: Secondary | ICD-10-CM | POA: Diagnosis present

## 2010-08-06 DIAGNOSIS — E785 Hyperlipidemia, unspecified: Secondary | ICD-10-CM | POA: Diagnosis present

## 2010-08-06 DIAGNOSIS — Z8673 Personal history of transient ischemic attack (TIA), and cerebral infarction without residual deficits: Secondary | ICD-10-CM

## 2010-08-06 DIAGNOSIS — S51009A Unspecified open wound of unspecified elbow, initial encounter: Secondary | ICD-10-CM | POA: Diagnosis present

## 2010-08-06 DIAGNOSIS — M949 Disorder of cartilage, unspecified: Secondary | ICD-10-CM | POA: Diagnosis present

## 2010-08-06 DIAGNOSIS — S1093XA Contusion of unspecified part of neck, initial encounter: Secondary | ICD-10-CM | POA: Diagnosis present

## 2010-08-06 DIAGNOSIS — S71009A Unspecified open wound, unspecified hip, initial encounter: Secondary | ICD-10-CM | POA: Diagnosis present

## 2010-08-06 DIAGNOSIS — W540XXA Bitten by dog, initial encounter: Secondary | ICD-10-CM | POA: Diagnosis present

## 2010-08-06 DIAGNOSIS — M899 Disorder of bone, unspecified: Secondary | ICD-10-CM | POA: Diagnosis present

## 2010-08-06 DIAGNOSIS — I951 Orthostatic hypotension: Secondary | ICD-10-CM | POA: Diagnosis not present

## 2010-08-06 LAB — DIFFERENTIAL
Basophils Absolute: 0.1 10*3/uL (ref 0.0–0.1)
Basophils Relative: 1 % (ref 0–1)
Eosinophils Absolute: 0.2 10*3/uL (ref 0.0–0.7)
Monocytes Absolute: 1 10*3/uL (ref 0.1–1.0)
Neutro Abs: 14.7 10*3/uL — ABNORMAL HIGH (ref 1.7–7.7)

## 2010-08-06 LAB — COMPREHENSIVE METABOLIC PANEL
BUN: 20 mg/dL (ref 6–23)
CO2: 28 mEq/L (ref 19–32)
Calcium: 9.9 mg/dL (ref 8.4–10.5)
Creatinine, Ser: 0.88 mg/dL (ref 0.4–1.2)
GFR calc Af Amer: >60 mL/min (ref >60)
GFR calc non Af Amer: >60 mL/min (ref >60)
Glucose, Bld: 136 mg/dL — ABNORMAL HIGH (ref 70–99)
Total Bilirubin: 0.5 mg/dL (ref 0.3–1.2)

## 2010-08-06 LAB — CBC
Hemoglobin: 12.4 g/dL (ref 12.0–15.0)
MCHC: 32.5 g/dL (ref 30.0–36.0)
Platelets: 161 10*3/uL (ref 150–400)
RDW: 14.5 % (ref 11.5–15.5)

## 2010-08-06 LAB — TYPE AND SCREEN: ABO/RH(D): O POS

## 2010-08-07 ENCOUNTER — Encounter (HOSPITAL_COMMUNITY): Payer: Self-pay

## 2010-08-07 ENCOUNTER — Emergency Department (HOSPITAL_COMMUNITY): Payer: Medicare Other

## 2010-08-07 ENCOUNTER — Inpatient Hospital Stay (HOSPITAL_COMMUNITY): Payer: Medicare Other

## 2010-08-07 LAB — URINALYSIS, ROUTINE W REFLEX MICROSCOPIC
Nitrite: NEGATIVE
Specific Gravity, Urine: 1.014 (ref 1.005–1.030)
Urobilinogen, UA: 0.2 mg/dL (ref 0.0–1.0)
pH: 8 (ref 5.0–8.0)

## 2010-08-07 LAB — CBC
HCT: 37.2 % (ref 36.0–46.0)
MCHC: 32.3 g/dL (ref 30.0–36.0)
MCV: 95.6 fL (ref 78.0–100.0)
RBC: 3.89 MIL/uL (ref 3.87–5.11)
WBC: 14.7 10*3/uL — ABNORMAL HIGH (ref 4.0–10.5)

## 2010-08-07 LAB — GLUCOSE, CAPILLARY
Glucose-Capillary: 123 mg/dL — ABNORMAL HIGH (ref 70–99)
Glucose-Capillary: 111 mg/dL — ABNORMAL HIGH (ref 70–99)
Glucose-Capillary: 107 mg/dL — ABNORMAL HIGH (ref 70–99)

## 2010-08-07 LAB — COMPREHENSIVE METABOLIC PANEL
ALT: 16 U/L (ref 0–35)
Albumin: 3.2 g/dL — ABNORMAL LOW (ref 3.5–5.2)
Alkaline Phosphatase: 47 U/L (ref 39–117)
Calcium: 9.3 mg/dL (ref 8.4–10.5)
Potassium: 4 mEq/L (ref 3.5–5.1)
Sodium: 142 mEq/L (ref 135–145)
Total Protein: 6.4 g/dL (ref 6.0–8.3)

## 2010-08-07 LAB — ABO/RH: ABO/RH(D): O POS

## 2010-08-08 LAB — BASIC METABOLIC PANEL
CO2: 27 mEq/L (ref 19–32)
Chloride: 103 mEq/L (ref 96–112)
GFR calc Af Amer: >60 mL/min (ref >60)
Potassium: 4 mEq/L (ref 3.5–5.1)
Sodium: 136 mEq/L (ref 135–145)

## 2010-08-08 LAB — GLUCOSE, CAPILLARY
Glucose-Capillary: 162 mg/dL — ABNORMAL HIGH (ref 70–99)
Glucose-Capillary: 149 mg/dL — ABNORMAL HIGH (ref 70–99)

## 2010-08-08 LAB — C-REACTIVE PROTEIN: CRP: 5.2 mg/dL — ABNORMAL HIGH (ref <0.6)

## 2010-08-08 LAB — CBC
Hemoglobin: 11.3 g/dL — ABNORMAL LOW (ref 12.0–15.0)
RBC: 3.78 MIL/uL — ABNORMAL LOW (ref 3.87–5.11)

## 2010-08-09 LAB — GLUCOSE, CAPILLARY
Glucose-Capillary: 105 mg/dL — ABNORMAL HIGH (ref 70–99)
Glucose-Capillary: 132 mg/dL — ABNORMAL HIGH (ref 70–99)

## 2010-08-10 LAB — CBC
MCV: 95.1 fL (ref 78.0–100.0)
Platelets: 137 10*3/uL — ABNORMAL LOW (ref 150–400)
RBC: 3.66 MIL/uL — ABNORMAL LOW (ref 3.87–5.11)
WBC: 11.9 10*3/uL — ABNORMAL HIGH (ref 4.0–10.5)

## 2010-08-10 LAB — GLUCOSE, CAPILLARY
Glucose-Capillary: 102 mg/dL — ABNORMAL HIGH (ref 70–99)
Glucose-Capillary: 134 mg/dL — ABNORMAL HIGH (ref 70–99)
Glucose-Capillary: 140 mg/dL — ABNORMAL HIGH (ref 70–99)
Glucose-Capillary: 217 mg/dL — ABNORMAL HIGH (ref 70–99)

## 2010-08-10 LAB — BASIC METABOLIC PANEL
BUN: 7 mg/dL (ref 6–23)
Chloride: 108 mEq/L (ref 96–112)
Potassium: 4.1 mEq/L (ref 3.5–5.1)
Sodium: 140 mEq/L (ref 135–145)

## 2010-08-11 LAB — COMPREHENSIVE METABOLIC PANEL
ALT: 14 U/L (ref 0–35)
AST: 20 U/L (ref 0–37)
CO2: 27 mEq/L (ref 19–32)
Calcium: 9.9 mg/dL (ref 8.4–10.5)
Chloride: 107 mEq/L (ref 96–112)
Creatinine, Ser: 0.68 mg/dL (ref 0.4–1.2)
GFR calc non Af Amer: >60 mL/min (ref >60)
Glucose, Bld: 122 mg/dL — ABNORMAL HIGH (ref 70–99)
Sodium: 141 mEq/L (ref 135–145)
Total Bilirubin: 0.4 mg/dL (ref 0.3–1.2)

## 2010-08-11 LAB — CBC
HCT: 36.7 % (ref 36.0–46.0)
HCT: 38.8 % (ref 36.0–46.0)
HCT: 40.4 % (ref 36.0–46.0)
Hemoglobin: 13.3 g/dL (ref 12.0–15.0)
Hemoglobin: 13.9 g/dL (ref 12.0–15.0)
MCH: 30.3 pg (ref 26.0–34.0)
MCHC: 32.2 g/dL (ref 30.0–36.0)
MCHC: 34.5 g/dL (ref 30.0–36.0)
MCV: 94.1 fL (ref 78.0–100.0)
Platelets: 160 10*3/uL (ref 150–400)
RBC: 4.03 MIL/uL (ref 3.87–5.11)
RBC: 4.22 MIL/uL (ref 3.87–5.11)
RDW: 14.7 % (ref 11.5–15.5)
RDW: 13.7 % (ref 11.5–15.5)
WBC: 10.1 10*3/uL (ref 4.0–10.5)

## 2010-08-11 LAB — DIFFERENTIAL
Basophils Absolute: 0 10*3/uL (ref 0.0–0.1)
Basophils Relative: 0 % (ref 0–1)
Eosinophils Relative: 2 % (ref 0–5)
Monocytes Absolute: 0.7 10*3/uL (ref 0.1–1.0)
Monocytes Relative: 6 % (ref 3–12)

## 2010-08-11 LAB — LIPID PANEL
Cholesterol: 125 mg/dL (ref 0–200)
HDL: 38 mg/dL — ABNORMAL LOW (ref >39)
LDL Cholesterol: 41 mg/dL (ref 0–99)

## 2010-08-11 LAB — CK TOTAL AND CKMB (NOT AT ARMC)
CK, MB: 1.3 ng/mL (ref 0.3–4.0)
CK, MB: 1.6 ng/mL (ref 0.3–4.0)
Relative Index: INVALID (ref 0.0–2.5)
Total CK: 30 U/L (ref 7–177)

## 2010-08-11 LAB — POCT CARDIAC MARKERS: Troponin i, poc: <0.05 ng/mL (ref 0.00–0.09)

## 2010-08-11 LAB — GLUCOSE, CAPILLARY
Glucose-Capillary: 108 mg/dL — ABNORMAL HIGH (ref 70–99)
Glucose-Capillary: 137 mg/dL — ABNORMAL HIGH (ref 70–99)
Glucose-Capillary: 138 mg/dL — ABNORMAL HIGH (ref 70–99)

## 2010-08-11 LAB — BASIC METABOLIC PANEL
BUN: 13 mg/dL (ref 6–23)
BUN: 19 mg/dL (ref 6–23)
Calcium: 8.6 mg/dL (ref 8.4–10.5)
Creatinine, Ser: 0.61 mg/dL (ref 0.4–1.2)
GFR calc Af Amer: >60 mL/min (ref >60)
GFR calc non Af Amer: >60 mL/min (ref >60)
GFR calc non Af Amer: >60 mL/min (ref >60)
Glucose, Bld: 111 mg/dL — ABNORMAL HIGH (ref 70–99)
Potassium: 4 mEq/L (ref 3.5–5.1)
Sodium: 147 mEq/L — ABNORMAL HIGH (ref 135–145)

## 2010-08-11 LAB — HEMOGLOBIN A1C
Hgb A1c MFr Bld: 6.8 % — ABNORMAL HIGH (ref <5.7)
Mean Plasma Glucose: 148 mg/dL — ABNORMAL HIGH (ref <117)

## 2010-08-11 LAB — URINALYSIS, ROUTINE W REFLEX MICROSCOPIC
Bilirubin Urine: NEGATIVE
Glucose, UA: NEGATIVE mg/dL
Hgb urine dipstick: NEGATIVE
Ketones, ur: NEGATIVE mg/dL
Nitrite: NEGATIVE
Specific Gravity, Urine: 1.009 (ref 1.005–1.030)
pH: 7 (ref 5.0–8.0)

## 2010-08-11 LAB — TROPONIN I
Troponin I: 0.01 ng/mL (ref 0.00–0.06)
Troponin I: 0.01 ng/mL (ref 0.00–0.06)

## 2010-08-11 LAB — TSH: TSH: 3.482 u[IU]/mL (ref 0.350–4.500)

## 2010-08-12 NOTE — Letter (Signed)
Summary: [facility]  [facility]   Imported By: Maryln Gottron 08/08/2010 13:00:08  _____________________________________________________________________  External Attachment:    Type:   Image     Comment:   External Document

## 2010-08-13 ENCOUNTER — Telehealth: Payer: Self-pay | Admitting: *Deleted

## 2010-08-13 NOTE — Telephone Encounter (Signed)
Pt's niece called to cancel some appts for the patient.  She wanted you to know that pt had fallen and  Was also attacked by a dog and has been in the hospital with scratches and some broken pelvic bones.  She will be leaving the hospital and will be going to [facility] for rehab for a few weeks.

## 2010-08-13 NOTE — Telephone Encounter (Signed)
Left message for Merrily Brittle to call back

## 2010-08-13 NOTE — Telephone Encounter (Signed)
Thanks so much for the update -- I actually did get one of her hospital notes and this clarifies it  I'm so sorry this happened - hope she gets better soon Will see her when she gets out of [facility]

## 2010-08-14 ENCOUNTER — Ambulatory Visit: Payer: No Typology Code available for payment source

## 2010-08-14 LAB — CULTURE, BLOOD (ROUTINE X 2)
Culture  Setup Time: 201203160115
Culture: NO GROWTH

## 2010-08-14 NOTE — Telephone Encounter (Signed)
Linda notified as instructed by phone and will call back upon pt's discharge from [facility].

## 2010-08-14 NOTE — Discharge Summary (Signed)
Lauren [name], Lauren [name] NO.:  [id number]  MEDICAL RECORD NO.:  [id number]           PATIENT TYPE:  I  LOCATION:  1431                         FACILITY:  [facility]  PHYSICIAN:  Thad Ranger, MD       DATE OF BIRTH:  1927-10-03  DATE OF ADMISSION:  08/06/2010 DATE OF DISCHARGE:                              DISCHARGE SUMMARY   PRIMARY CARE PHYSICIAN:  Marne A. Tower, MD  DISCHARGE DIAGNOSES: 1. Acute nondisplaced right inferior pubic rami fracture. 2. Dog bite. 3. Dehydration with hypotension, resolved. 4. History of neuropathy. 5. History of transient ischemic attack. 6. Hyperlipidemia. 7. Diabetes type 2. 8. Gastritis.  CONSULTATIONS:  Orthopedics, Alvy Beal, MD.  DISCHARGE MEDICATIONS: 1. Augmentin 875 mg p.o. b.i.d. for 12 days, to stop on August 22, 2010. 2. Bacitracin polymyxin ointment 1 application topically as needed. 3. Robaxin 500 mg p.o. every 6 hours as needed for pain. 4. Flagyl 500 mg p.o. t.i.d. for 12 days, to stop on March 30. 5. Percocet to 5/325 mg 1 to 2 tablets p.o. every 6 hours as needed     for pain. 6. Aspirin enteric-coated 325 mg p.o. daily. 7. Calcium carbonate and vitamin D 600/400 one tablet p.o. b.i.d. 8. Femara 2.5 mg p.o. daily. 9. Fish oil 1000 mg p.o. daily. 10.Icy Hot Menthol 7.5% 1 application topically daily as needed to     feet pain. 11.Metformin 500 mg p.o. daily. 12.MiraLax 17 gm p.o. daily as needed for constipation. 13.PreserVision 1 capsule p.o. b.i.d. 14.Tylenol Extra Strength 500 mg 1 to 2 tablets every 6 hours as     needed for pain. 15.Zantac 150 mg p.o. b.i.d. 16.Zocor 20 mg p.o. daily.  HISTORY OF PRESENT ILLNESS:  Ms. Crawshaw is an 77 year old female with history of breast cancer status post lumpectomy, hyperlipidemia, diabetes, apparently went over to her niece's house and the neighbor's dog came up and bit her on the back of the left leg and then jumped up on her causing her to fall forward  and injuring her right hip as well as bump her head on the concrete.  The patient was admitted for further workup.  RADIOLOGICAL DATA:  Chest x-ray 2-view March 14, cardiac enlargement with no evidence of active pulmonary disease.  Right hip x-ray March 14, acute appearing nondisplaced fracture, right inferior pubic rami degenerative changes in both hips.  Right wrist x-ray, no acute fractures or subluxation identified.  Right elbow x-ray March 14, no acute fracture or subluxation.  CT of head without contrast March 15, no evidence of traumatic intracranial injury or fracture to minimal soft tissue thickening overlying the high right parietal calvarium.  Small chronic infarct within the right anterior corona radiata and scattered small-vessel ischemic microangiopathy, near complete opacification of the right sphenoid sinus.  PERTINENT LABORATORY AND DIAGNOSTIC DATA:  BMET at the time of discharge, sodium 141, potassium 3.8, BUN 13, creatinine 0.6.  CBC, white count 10.9, hemoglobin 11.8, hematocrit 36.7, platelets 178,000. Blood cultures remain negative till date.  ESR 25.  CRP slightly elevated 5.2, HbA1c 6.6.  UA, negative for any  UTI.  BRIEF HOSPITALIZATION COURSE:  Ms. Gruver is an 76 year old female who was admitted with right inferior pubic rami fracture, nondisplaced, secondary to mechanical fall. 1. Right inferior pubic rami fracture, nondisplaced.  [facility]     [facility] was consulted and the patient was seen by Dr. Venita Lick.  Per Dr. Shon Baton' recommendation, she does not need any     acute surgical intervention and the patient was mobilized with     physical therapy and weightbearing as tolerated.  She will follow     up with Dr. Lequita Halt over the next 1 to 2 weeks following discharge.     Per the PT evaluation, the patient needs short-term rehab to     continue increasing strengthening and mobility. 2. Dog bite.  The patient had small puncture wound on the left  thigh     and laceration over the right elbow.  The plantar bones with bite     marks are uncomplicated; however, the patient did have leukocytosis     and elevated ESR and CRP.  Hence, the patient was started on     antibiotics empirically.  She should continue the antibiotics,     Augmentin and Flagyl to complete the course of 14 days.  She was     also given tetanus shot. 3. History of neuropathy.  The patient was on Neurontin at the time of     admission which has been tapered off.  She does not require any     further Neurontin.  Continue with Percocet as needed for the pain     for pubic rami fracture as well as Tylenol Extra Strength.  She     will need continued PT/OT. 4. Diabetes mellitus type 2.  The patient was continued on sliding-     scale insulin inpatient.  She can be continued on metformin at the     time of discharge. 5. Laceration over the right elbow.  Sutures reabsorbable were placed     by the ED.  Continue with the wound care and dressing changes     daily.  DISCHARGE DIET:  Mechanical soft diabetic diet.  PHYSICAL EXAMINATION:  VITAL SIGNS:  At the time of dictation, blood pressure 156/77, temperature 99.0, pulse 97, respirations 16, O2 sats 91% on room air. GENERAL:  The patient is alert, awake and oriented and is not in acute distress. HEENT:  Anicteric sclerae.  Conjunctivae are clear.  Pupils are reactive to light and accommodation.  EOMI. NECK:  Supple.  No lymphadenopathy. CVS:  S1 and S2 clear. CHEST:  Clear to auscultation bilaterally. ABDOMEN:  Soft, nontender, nondistended.  Normal bowel sounds. EXTREMITIES:  Dressing on the left thigh and the right elbow, otherwise no edema.  DISCHARGE INSTRUCTIONS:  Discharge followup with Dr. Ollen Gross within 7 to 10 days of the discharge and Dr. Roxy Manns within next 2 to 3 weeks.  Discharge time 35 minutes.     Thad Ranger, MD     RR/MEDQ  D:  08/11/2010  T:  08/11/2010  Job:  [id number]  cc:    Alvy Beal, MD Fax: [phone]  Ollen Gross, M.D. Fax: [phone]  Marne A. Tower, MD [address] [place], [place] [address]  Electronically Signed by Andres Labrum Presli Fanguy  on 08/14/2010 05:37:21 PM

## 2010-08-18 NOTE — Consult Note (Signed)
Lauren [name], Lauren [name] NO.:  [id number]  MEDICAL RECORD NO.:  [id number]           PATIENT TYPE:  I  LOCATION:  1431                         FACILITY:  [facility]  PHYSICIAN:  Alvy Beal, MD    DATE OF BIRTH:  [birth year]  DATE OF CONSULTATION:  08/07/2010 DATE OF DISCHARGE:                                CONSULTATION   REQUESTING PHYSICIAN:  Massie Maroon, MD  Requested by Dr. Pearson Grippe for evaluation of right pubic inferior rami fracture.  HISTORY:  This is an 77 year old woman who has a history of breast cancer status post lumpectomy, hyperlipidemia, type 2 diabetes, who unfortunately was attacked by her neighbor's dog.  The dog came up, bit her in the back of the left leg and then jumped on her causing her to fall forward and injured her right hip.  She was brought to the emergency room for further evaluation and treatment.  She has had a CT scan of the brain which was negative for acute fracture.  She has had a chest x-ray which shows no evidence of acute pulmonary disease, right wrist x-rays which demonstrate no acute fractures or subluxation or dislocation, and elbow x-rays of the right side which were also negative.  She also had a pelvic/right hip x-ray which demonstrated inferior rami fracture as well as degenerative disease of the hips.  As a result of the fracture, orthopedic consultation was requested.  Past medical, surgical, family, social history includes diabetes, diabetic retinopathy, fibrocystic breast disease, breast cancer status post lumpectomy.  She has had a TIA in April 2010, hyperlipidemia, gastritis, chronic headaches, chronic constipation, venous insufficiency, osteopenia.  Previous surgeries include left breast cyst drainage, left breast mass biopsy, colonic polyps, status post colonoscopy and cataract surgery.  The patient does not use tobacco or alcohol.  MEDICATIONS ON ADMISSION:  Include Zantac, Zocor, aspirin, Femara,  fish oil, MiraLax, Tylenol, calcium, Lutein, Neurontin, Tessalon.  CLINICAL EXAM:  She is currently in bed, resting.  She has no gross crepitus, deformity or pain in the upper extremities.  Lower extremities, pelvis is tender in the right groin but no significant pain with internal or external rotation of the hips.  No knee or ankle pain. Compartments are soft, nontender.  She has intact peripheral pulses. EHL, tibialis anterior, gastrocnemius are 5/5.  There is a small dog bite to the posterior aspect of the left thigh.  At this point in time, I have reviewed the x-rays.  The patient has a stable pelvic fracture and does not require acute surgical intervention. I will defer treatment of the dog bite to her primary care physician and the medical service. With respect to the right pubic rami fracture, no need for acute surgical intervention.  She be mobilized with physical therapy, weightbearing as tolerated and she can follow up with Dr. Lequita Halt per her request in 7 to 10 days following discharge.     Alvy Beal, MD     DDB/MEDQ  D:  08/07/2010  T:  08/08/2010  Job:  [id number]  cc:   Alvy Beal, MD Fax: [phone]  Electronically Signed  by Venita Lick MD on 08/18/2010 01:58:58 PM

## 2010-08-25 ENCOUNTER — Other Ambulatory Visit: Payer: Self-pay

## 2010-08-28 ENCOUNTER — Ambulatory Visit: Payer: Self-pay | Admitting: Family Medicine

## 2010-08-29 ENCOUNTER — Other Ambulatory Visit (HOSPITAL_COMMUNITY): Payer: Self-pay | Admitting: Surgery

## 2010-09-03 ENCOUNTER — Ambulatory Visit: Payer: Self-pay | Admitting: Family Medicine

## 2010-09-03 LAB — PROTIME-INR
INR: 1 (ref 0.00–1.49)
Prothrombin Time: 13.4 seconds (ref 11.6–15.2)

## 2010-09-03 LAB — HEMOGLOBIN A1C
Hgb A1c MFr Bld: 6.6 % — ABNORMAL HIGH (ref 4.6–6.1)
Mean Plasma Glucose: 143 mg/dL

## 2010-09-03 LAB — CBC
Hemoglobin: 13.7 g/dL (ref 12.0–15.0)
MCHC: 34.7 g/dL (ref 30.0–36.0)
MCHC: 34.7 g/dL (ref 30.0–36.0)
MCV: 93.5 fL (ref 78.0–100.0)
Platelets: 148 10*3/uL — ABNORMAL LOW (ref 150–400)
RBC: 3.85 MIL/uL — ABNORMAL LOW (ref 3.87–5.11)
RBC: 4.23 MIL/uL (ref 3.87–5.11)

## 2010-09-03 LAB — COMPREHENSIVE METABOLIC PANEL
ALT: 16 U/L (ref 0–35)
Alkaline Phosphatase: 71 U/L (ref 39–117)
CO2: 28 mEq/L (ref 19–32)
GFR calc non Af Amer: >60 mL/min (ref >60)
Glucose, Bld: 110 mg/dL — ABNORMAL HIGH (ref 70–99)
Potassium: 3.9 mEq/L (ref 3.5–5.1)
Sodium: 140 mEq/L (ref 135–145)

## 2010-09-03 LAB — BASIC METABOLIC PANEL
CO2: 24 mEq/L (ref 19–32)
Calcium: 8.4 mg/dL (ref 8.4–10.5)
Creatinine, Ser: 0.53 mg/dL (ref 0.4–1.2)
GFR calc Af Amer: >60 mL/min (ref >60)

## 2010-09-03 LAB — TROPONIN I: Troponin I: <0.01 ng/mL (ref 0.00–0.06)

## 2010-09-03 LAB — DIFFERENTIAL
Basophils Relative: 1 % (ref 0–1)
Eosinophils Absolute: 0.3 10*3/uL (ref 0.0–0.7)
Neutrophils Relative %: 63 % (ref 43–77)

## 2010-09-03 LAB — LIPID PANEL
Cholesterol: 101 mg/dL (ref 0–200)
HDL: 29 mg/dL — ABNORMAL LOW (ref >39)
Triglycerides: 101 mg/dL (ref <150)

## 2010-09-03 LAB — GLUCOSE, CAPILLARY

## 2010-09-03 LAB — CK TOTAL AND CKMB (NOT AT ARMC)
Relative Index: INVALID (ref 0.0–2.5)
Relative Index: INVALID (ref 0.0–2.5)
Total CK: 38 U/L (ref 7–177)

## 2010-09-04 NOTE — H&P (Signed)
Lauren [name], CAPASSO NO.:  [id number]  MEDICAL RECORD NO.:  [id number]           PATIENT TYPE:  E  LOCATION:  WLED                         FACILITY:  [facility]  PHYSICIAN:  Massie Maroon, MD        DATE OF BIRTH:  12/06/1927  DATE OF ADMISSION:  08/06/2010 DATE OF DISCHARGE:                             HISTORY & PHYSICAL   CHIEF COMPLAINT:  This is an 77 year old female with a history of breast cancer status post lumpectomy, hyperlipidemia, diabetes type 2, apparently went over to her niece's house and the neighbor's dog came up and bit her on the back of the left leg and then jumped up on her causing her to fall forward and injured her right hip as well as to bump her head on concrete.  CT scan of the brain is pending at this time. The patient appears alert and oriented x3.  The patient is not sure but she thinks that the dog's rabies shots are up-to-date.  The patient was brought to the ED for further evaluation.  She had a small laceration to her right elbow as well as puncture wounds and bruising on the back of her left thigh.  The patient also was found to have a right pubic rami fracture which ER says is not amendable to surgery or not need of surgery.  The patient does not have good social support at home and so she will be admitted for observation  for pubic rami fracture.  PAST MEDICAL HISTORY: 1. Diabetes type 2. 2. Diabetic retinopathy. 3. Fibrocystic breasts. 4. Breast cancer status post lumpectomy. 5. TIA, April 2010, with negative MRI/MRA and carotid Dopplers. 6. Hyperlipidemia. 7. Gastritis. 8. Chronic headaches. 9. Chronic constipation. 10.Venous insufficiency. 11.Osteopenia on DEXA, October 2002.  PAST SURGICAL HISTORY: 1. Left breast cyst drainage. 2. Left breast mass biopsy, May 2009, which showed invasive ductal     carcinoma status post lumpectomy with sentinel lymph node biopsy. 3. Colonic polyps on colonoscopy in 1998 which also  showed     diverticulitis and hemorrhoids.  Colonoscopy January 2004 -     diverticulitis. 4. Cataract surgery.  SOCIAL HISTORY:  The patient never smoked and does not drink.  She lives at home and is a caretaker of 2 older sisters.  FAMILY HISTORY:  Positive for stroke in her mother and 2 sisters with breast cancer.  ALLERGIES:  SULFA, ASPIRIN.  MEDICATIONS: 1. Zantac 150 mg p.o. b.i.d. 2. Zocor 20 mg p.o. q.h.s. 3. Enteric-coated aspirin 325 mg p.o. daily. 4. Femara or letrozole 2.5 mg p.o. daily. 5. Fish oil 1000 mg p.o. daily. 6. MiraLax 17 g p.o. daily p.r.n. 7. Tylenol 500 mg p.o. q.6 h p.r.n. 8. Calcium carbonate 600/400 IU 2 p.o. daily. 9. PreserVision/Lutein 1 p.o. b.i.d. 10.Gabapentin 300 mg p.o. q.a.m. and 600 mg p.o. q.p.m. 11.Tessalon Perles 200 mg p.o. t.i.d. p.r.n. cough.  REVIEW OF SYSTEMS:  Negative for all 10 organ systems except for pertinent positives stated above.  PHYSICAL EXAMINATION:  VITAL SIGNS:  Temperature 98.8, pulse 88, blood pressure 175/72, repeat blood pressure 128/57, pulse ox is 97%  on room air. HEENT: Anicteric. NECK:  No JVD, no bruit. HEART:  Regular rate and rhythm.  S1, S2 with occasional premature beat. LUNGS:  Clear to auscultation bilaterally. ABDOMEN:  Soft, nontender, nondistended.  Positive bowel sounds. EXTREMITIES:  No cyanosis, clubbing or edema. SKIN:  There is bruising behind the left thigh along with 2 bite marks. There is a laceration over the right elbow. NEURO EXAM:  Nonfocal.  Cranial nerves II through XII intact.  Reflexes 2+, symmetric, diffuse with downgoing toes bilaterally, motor strength 5/5 in all 4 extremities, pinprick intact.  RADIOLOGIC DATA:  EKG, normal sinus rhythm at 75, normal axis, normal PR intervals, occasional PAC.  LABORATORY DATA:  Urinalysis negative.  Sodium 142, potassium 3.9, BUN 20, creatinine 0.88, AST 24, ALT 19.  WBC 18.0, hemoglobin 12.4, platelet count 161.  X-ray of the hip  shows acute appearing nondisplaced fracture of the right inferior pubic ramus and degenerative changes in both hips.  Wrist x-ray on the right shows no evidence of acute fracture.  X-ray of the right elbow shows no acute fracture or subluxation.  ASSESSMENT AND PLAN: 1. Right inferior pubic rami fracture, nondisplaced:  Consult     [facility] please in the a.m.  The patient specifically     asked for [facility].  Pain control with Dilaudid as     needed, hold for sedation.  Tylenol for mild pain. 2. Dog bite:  We will clean the area with peroxide and cover with a     dry dressing.  The patient's family is going to find out if the dog     is up-to-date on its rabies shots, they think that it is at the     present time.  Tetanus shot. 3. Laceration over right elbow:  Sutures reabsorbable were placed by     ER. 4. Head contusion:  Check CT of the brain noncontrast stat. 5. Diabetes type 2:  Fingerstick blood sugars a.c. and h.s. NovoLog     sensitive sliding scale. 6. History of breast cancer: Continue letrozole. 7. Hyperlipidemia:  Continue Zocor.  Continue fish oil. 8. Deep vein thrombosis prophylaxis.  SCDs.9. Osteopenia:  Consider repeat DEXA scan if none recently done and     since she did have fracture, consider an antiresorptive agent.     Massie Maroon, MD     JYK/MEDQ  D:  08/07/2010  T:  08/07/2010  Job:  [id number]  cc:   Pierce Crane, M.D., F.R.C.P.C. Fax: [phone]  Marne A. Tower, MD [address] [place], [place] [address]  Electronically Signed by Pearson Grippe MD on 09/04/2010 01:44:45 AM

## 2010-09-11 ENCOUNTER — Ambulatory Visit (HOSPITAL_COMMUNITY)
Admission: RE | Admit: 2010-09-11 | Discharge: 2010-09-11 | Disposition: A | Discharge status: Home / Self Care | Payer: Medicare Other | Source: Ambulatory Visit | Attending: Surgery | Admitting: Surgery

## 2010-09-11 DIAGNOSIS — R1310 Dysphagia, unspecified: Secondary | ICD-10-CM | POA: Insufficient documentation

## 2010-09-12 ENCOUNTER — Ambulatory Visit: Payer: Self-pay | Admitting: Family Medicine

## 2010-09-17 ENCOUNTER — Encounter: Payer: Self-pay | Admitting: Family Medicine

## 2010-09-17 ENCOUNTER — Ambulatory Visit (INDEPENDENT_AMBULATORY_CARE_PROVIDER_SITE_OTHER): Payer: Medicare Other | Admitting: Family Medicine

## 2010-09-17 ENCOUNTER — Other Ambulatory Visit: Payer: Self-pay | Admitting: Family Medicine

## 2010-09-17 DIAGNOSIS — T148XXA Other injury of unspecified body region, initial encounter: Secondary | ICD-10-CM

## 2010-09-17 DIAGNOSIS — R1310 Dysphagia, unspecified: Secondary | ICD-10-CM | POA: Insufficient documentation

## 2010-09-17 DIAGNOSIS — W540XXA Bitten by dog, initial encounter: Secondary | ICD-10-CM | POA: Insufficient documentation

## 2010-09-17 DIAGNOSIS — R109 Unspecified abdominal pain: Secondary | ICD-10-CM

## 2010-09-17 DIAGNOSIS — R197 Diarrhea, unspecified: Secondary | ICD-10-CM

## 2010-09-17 DIAGNOSIS — G8929 Other chronic pain: Secondary | ICD-10-CM | POA: Insufficient documentation

## 2010-09-17 DIAGNOSIS — Z8781 Personal history of (healed) traumatic fracture: Secondary | ICD-10-CM | POA: Insufficient documentation

## 2010-09-17 DIAGNOSIS — S329XXA Fracture of unspecified parts of lumbosacral spine and pelvis, initial encounter for closed fracture: Secondary | ICD-10-CM

## 2010-09-17 MED ORDER — OMEPRAZOLE 20 MG PO CPDR: 20.0000 mg | DELAYED_RELEASE_CAPSULE | Freq: Every day | ORAL | Status: DC

## 2010-09-17 MED ORDER — CITALOPRAM HYDROBROMIDE 10 MG PO TABS: 10.0000 mg | ORAL_TABLET | Freq: Every day | ORAL | Status: DC

## 2010-09-17 NOTE — Assessment & Plan Note (Signed)
S/p hosp with mucous and gas- and much bloating   Check cdiff test

## 2010-09-17 NOTE — Progress Notes (Signed)
Subjective:    Patient ID: Lauren [name], female    DOB: [birth year], 77 y.o.   MRN: [id number]  HPI Is here s/p hosp and rehab stay after pubic ramus fx (fall sustained after dog bite) Also abx for bite and sutures on elbow  Had been feeding a family member's cats  A neighbor's dog ran across the street and bit her and she fell    Overall doing well with the pelvic pain - is getting around with walker without a problem   Stomach is bothering her a lot -- ever since going in the hospital  Did a swallowing study on her in the hospital - and was found to have mild swallowing problems - changed her diet to soft foods initially but back to normal   Is on 325 mg of aspirin -- -- and wants to go to 81 mg  Is uneasy in the stomach  Breaks out in a sweat at the breakfast  Uncomfortable sensation - hard to describe/ a little pain  Makes her not want to eat -- although sometimes makes her feel better  Wt is down 2 lbs  They gave her "too much food" at the nsg home  Has had some heartburn occasionally - especially in the past (controlled it with diet) Had a bit of it last night  Also is very bloated   Was on augmentin in hospital  More frequent stools  More mucous in stool     Wt is up 2 lb Took her off gabapentin while in hosp  Past Medical History  Diagnosis Date  . Diabetes mellitus   . Gout     injection in the past  . Fibrocystic breast   . Venous insufficiency   . Hyperlipidemia   . Breast cancer   . TIA (transient ischemic attack) 08/2008    neg MRI/MRA and CT and carotid dopplers  . Chronic constipation   . Gastritis   . Chronic headaches    Past Surgical History  Procedure Date  . Breast cyst aspiration   . Eye surgery     retinal, then cataract  . Breast lumpectomy 09/2007    with sentinel LN biopsy    reports that she has never smoked. She does not have any smokeless tobacco history on file. She reports that she does not drink alcohol. Her drug history not  on file. family history includes Breast cancer in her sisters. Allergies  Allergen Reactions  . Aspirin     REACTION: GI  . Sulfonamide Derivatives     REACTION: itching      Review of Systems Review of Systems  Constitutional: Negative for fever, appetite change, fatigue and unexpected weight change.  Eyes: Negative for pain and visual disturbance.  Respiratory: Negative for cough and shortness of breath.   Cardiovascular: Negative.   Gastrointestinal: pos for abd pain/ bloating/ dysphagia and stool change / lighter in color/ mucous Genitourinary: Negative for urgency and frequency.  Skin: Negative for pallor.  Neurological: Negative for weakness, light-headedness, numbness and headaches.  Hematological: Negative for adenopathy. Does not bruise/bleed easily.  Psychiatric/Behavioral: Negative for dysphoric mood. The patient is not nervous/anxious.        Objective:   Physical Exam  Constitutional: She appears well-developed and well-nourished.       Frail appearing elderly female who ambulates well with walker   HENT:  Head: Normocephalic and atraumatic.  Eyes: Conjunctivae and EOM are normal. Pupils are equal, round, and reactive to light.  Neck: Normal range of motion. Neck supple. No JVD present. Carotid bruit is not present. No thyromegaly present.  Cardiovascular: Normal rate, regular rhythm and normal heart sounds.   No murmur heard. Pulmonary/Chest: Effort normal and breath sounds normal. She has no wheezes. She has no rales.  Abdominal: Soft. Normal aorta. She exhibits distension. She exhibits no pulsatile liver, no abdominal bruit, no ascites and no mass. Bowel sounds are increased. There is no hepatosplenomegaly. There is tenderness in the periumbilical area. There is no rigidity, no rebound, no guarding, no CVA tenderness and negative Murphy's sign.  Musculoskeletal: She exhibits no edema and no tenderness.  Lymphadenopathy:    She has no cervical adenopathy.    Neurological: She is alert. She has normal reflexes. No cranial nerve deficit. Coordination normal.       Mentally sharp   Skin: Skin is warm and dry. No rash noted. No erythema. No pallor.  Psychiatric: She has a normal mood and affect.          Assessment & Plan:

## 2010-09-17 NOTE — Assessment & Plan Note (Signed)
Nonspecific with swallowing disorder mild in hospital Change zantac to ppi Dec asa to 81 mg  Niece has made her appt to see Gi - Dr Claretha Cooper- will do ref

## 2010-09-17 NOTE — Patient Instructions (Addendum)
Stop zantac  Start omeprazole 20 mg once daily in the am  We will do GI referral at check out  Decrease aspirin to 81 mg coated once daily with food  I took [place] off your list  If your symptoms worsen in the meantime- please let me know

## 2010-09-18 ENCOUNTER — Encounter: Payer: Self-pay | Admitting: Family Medicine

## 2010-09-18 NOTE — Assessment & Plan Note (Signed)
Sustained after a fall and dog bite Overall doing well and ambulates with walker Good progress with PT

## 2010-09-18 NOTE — Assessment & Plan Note (Signed)
This is healed Disc imp of contacting police/ animal control about the animal and talking with the family about this Did have many days of abx in hospital  Is up to date on tetnus shot

## 2010-09-18 NOTE — Assessment & Plan Note (Signed)
Found mild in swallowing study in rehab Pt no longer needs to adj diet, however  occas heartburn  Starting ppi and ref to GI

## 2010-09-24 ENCOUNTER — Encounter: Payer: Self-pay | Admitting: Family Medicine

## 2010-09-29 LAB — CLOSTRIDIUM DIFFICILE CULTURE-FECAL: Result-CDIFCU: NOT DETECTED

## 2010-10-01 ENCOUNTER — Ambulatory Visit
Admission: RE | Admit: 2010-10-01 | Discharge: 2010-10-01 | Disposition: A | Discharge status: Home / Self Care | Payer: Medicare Other | Source: Ambulatory Visit | Attending: Gastroenterology | Admitting: Gastroenterology

## 2010-10-01 ENCOUNTER — Other Ambulatory Visit: Payer: Self-pay | Admitting: Gastroenterology

## 2010-10-06 ENCOUNTER — Other Ambulatory Visit: Payer: No Typology Code available for payment source

## 2010-10-07 NOTE — H&P (Signed)
Lauren [name], SANKS NO.:  [id number]   MEDICAL RECORD NO.:  [id number]          PATIENT TYPE:  INP   LOCATION:  1830                         FACILITY:  [facility]   PHYSICIAN:  Mick Sell, MD DATE OF BIRTH:  1927-12-19   DATE OF ADMISSION:  09/07/2008  DATE OF DISCHARGE:                              HISTORY & PHYSICAL   PRIMARY CARE PHYSICIAN:  Marne A. Milinda Antis, MD., [place] [facility].   REASON FOR ADMISSION:  TIA.   HISTORY OF PRESENT ILLNESS:  This is a very pleasant 77 year old white  female with a very limited medical history including mild hyperlipidemia  and breast cancer status post lumpectomy on letrozole since June 2009,  who was brought in by EMS after an episode of confusion while washing  her car this afternoon around 4 p.m.  She is vague about what exactly  happened as her memory is not intact around that time.  She says she  remembers filling the bucket to wash the car, but never actually started  washing the car.  Her nephew showed up at her house and noticed her  acting confused and having a glazed look.  They called their friend who  is a Naval architect and he evaluated her at the house and called 911.  There was no noted or witnessed loss of consciousness, trauma, seizure  activity, focal weakness or slurred speech.  The patient has at baseline  a very mild left facial droop which has been there for a prolonged  period of time.  This was initially felt it might be new, so a code  stroke was called.  However when she was evaluated by Dr. Preston Fleeting in the  ED, the code stroke was cancelled as she was neurologically intact.  When she  came to the ED, she was back to normal with no focal deficits  and no further confusion.  She actually says she remembers the time all  this was going on, but it is spotty memory and she is not exactly sure  what happened.   PAST MEDICAL HISTORY:  1. Breast cancer status post lumpectomy in June 2009 and on  letrozole.  2. Hyperlipidemia on Zocor.  3. Status post appendectomy.   MEDICATIONS:  1. Zocor once a day.  2. She is on letrozole for breast cancer.  3. She is on occasional Vicodin.   FAMILY HISTORY:  Her brothers both had a CVA in the past.   ALLERGIES:  SULFA DRUGS.   REVIEW OF SYSTEMS:  Eleven systems reviewed and negative except as per  HPI.  Of note, she denies any focal weight loss, fevers, chills, night  sweats, chest pain, palpitation, shortness of breath, cough, dyspnea on  exertion, abdominal pain, nausea, vomiting, diarrhea, lower extremity  edema, rash, joint symptoms.   PHYSICAL EXAMINATION:  VITAL SIGNS:  In the ED, temperature 98.0, pulse  75, blood pressure 147/69 up to 151/65, respiratory rate 18-22, satting  98-100% on room air.  GENERAL:  She is in no acute distress.  She is a pleasant interactive  woman.  HEENT:  Her  pupils are equal, round, reactive to light and  accommodation.  Her extraocular movements are intact.  Her sclerae are  anicteric.  Her oropharynx is moist.  NECK:  Supple.  She has 2+ carotid upstrokes with no bruits noted.  HEART:  Regular with no murmurs, rubs or gallops.  LUNGS:  Clear to auscultation bilaterally.  ABDOMEN:  Soft, nontender, nondistended.  EXTREMITIES:  No clubbing, cyanosis or edema.  SKIN:  Without rash.  NEURO:  Cranial nerves II-XII are intact.  She does have a mild chronic  left facial droop, but the family confirms that this has been lifelong.  Strength is 5/5 bilateral upper extremity.  Lower extremity reflexes 2+  and symmetric.  Finger-to-nose is intact and negative Babinski.  Negative pronator drift.   LABORATORY DATA:  White blood count of 10, hemoglobin 13.7, platelets  170.  Coags were within normal limits.  Basic panel was within normal  limits.  LFTs were within normal limits.  Calcium 10.1, albumin 3.8, CK  40, troponin less than 0.001.   DIAGNOSTICS:  1. EKG showed normal sinus rhythm with low  voltage, but no other      changes.  2. CT of the head without contrast which showed no acute findings      except mild chronic microvascular disease.   IMPRESSION:  This is a 77 year old white female with few medical  problems, now with a neuro event including confusion, mild altered  mental state, but no focal neurological findings and no current  symptoms.  The differential diagnosis would include TIA, CVA, seizure  disorder, temporary hypoperfusion due to arrhythmia, cardiac output  issues or hypoglycemia.   PLAN:  1. I discussed the case with Dr. Pearlean Brownie and he has seen her in      consultation  2. We will get an MRI/MRA of her neck and head to rule out CVA or      focal vascular disease.  3. Continue the patient on telemetry.  4. We will check a second set of cardiac enzymes.  5. Check lipids and hemoglobin A1c in the a.m.  6. Start an aspirin 81 mg once a day.      Mick Sell, MD  Electronically Signed     DPF/MEDQ  D:  09/07/2008  T:  09/08/2008  Job:  [id number]

## 2010-10-07 NOTE — Discharge Summary (Signed)
NAMEJULIAN, ASKIN NO.:  [id number]   MEDICAL RECORD NO.:  [id number]          PATIENT TYPE:  INP   LOCATION:  3004                         FACILITY:  [facility]   PHYSICIAN:  Stacie Glaze, MD    DATE OF BIRTH:  [birth year]   DATE OF ADMISSION:  09/07/2008  DATE OF DISCHARGE:  09/08/2008                               DISCHARGE SUMMARY   Diagnosis of observation admission for altered mental status, confusion,  and slurred speech.   DISCHARGE DIAGNOSIS:  Transient ischemic attack.   HOSPITAL COURSE:  The patient is a 77 year old white female with a  history of breast cancer, hyperlipidemia, but no history of hypertension  or previous cerebrovascular disease who presented to the emergency room  with sudden onset of confusion, slurred speech, and change in mental  status.  By the time she was seen in the emergency room, she was at the  baseline.  An MRI/MRA was ordered.  Lipid panel was drawn, and she was  placed on aspirin.  A neurology consult was obtained.  The Neurologist  Stroke Team consulted with the patient, believed that the patient had an  apparent episode of aphasia, dysarthria, lasting very briefly with no  focal weakness nor residual findings.  No sensory losses.  The MRI  showed no acute infarct.  The CT showed no bleed.  Therefore, she had  some sort of transient memory loss/confusion that was felt to be  possibly a TIA, but an outpatient EEG should be pursued to rule out any  sort of stroke or absent seizure.   It was felt that she was stable at baseline.  Her primary complaint now  was fatigue.  She was placed on a fall aspirin daily to continue her  outpatient medications and to follow up with her primary care doctor at  [facility] next week who should schedule her for an EEG with a  neurologist and followup neurology consult.  Her aspirin was increased  to 325 mg.  She was continued on her Zocor, and no other medical changes  were made at this  time.      Stacie Glaze, MD  Electronically Signed     JEJ/MEDQ  D:  09/08/2008  T:  09/08/2008  Job:  [id number]   cc:   Marne A. Milinda Antis, MD

## 2010-10-07 NOTE — Consult Note (Signed)
NAMEKEYAUNA, GRAEFE NO.:  [id number]   MEDICAL RECORD NO.:  [id number]          PATIENT TYPE:  INP   LOCATION:  3004                         FACILITY:  [facility]   PHYSICIAN:  Pramod P. Pearlean Brownie, MD    DATE OF BIRTH:  08/12/1927   DATE OF CONSULTATION:  DATE OF DISCHARGE:                                 CONSULTATION   REFERRING PHYSICIAN:  Mick Sell, MD   REASON FOR REFERRAL:  TIA.   HISTORY OF PRESENT ILLNESS:  Ms. Putnam is a 77 year old pleasant  Caucasian lady who had brief episode of confusion and memory loss this  evening.  She remembers that she wanted to wash her car since it had  pollen on it and she had to go to a funeral tomorrow.  She remembers  coming out and then next thing she remembers is her nephew showed up and  she had followed him into the house, nephew noticed that she had a blank  look on her face and she had been really confused and could not remember  things well.  She is not sure whether she had actually washed the car or  not and this was not determined until much later after she got here.  The EMS people noticed left facial asymmetry and called a code stroke,  however subsequently the family informed the ER physician that the  facial droop was an old finding and hence the code stroke was cancelled.  The patient denies currently any headache, double vision, vertigo, focal  extremity weakness, gait or balance problems.  There is no prior history  of stroke, TIA, or seizures.  There is no significant head injury.   PAST MEDICAL HISTORY:  Significant only for hyperlipidemia.   HOME MEDICATIONS:  Zocor, letrozole, hydrocodone, Tylenol.   MEDICATION ALLERGIES:  None.   SOCIAL HISTORY:  The patient lives alone in [place], [place].  She is independent in activities of daily living.  She does not smoke or  drink.   REVIEW OF SYSTEMS:  Negative for any recent chest pain, palpitation,  cough, shortness of breath, diarrhea, or  other illness.   PHYSICAL EXAMINATION:  GENERAL:  A pleasant, elderly Caucasian lady who  is currently not in distress.  VITAL SIGNS:  She is afebrile.  Pulse rate 72 per minute, regular; blood  pressure is 147/69, respiratory rate is 19 per minute; oxygen sats 98%  on room air.  HEAD:  Nontraumatic.  NECK:  Supple without bruit.  CARDIAC:  Regular heart sounds.  No murmur or gallop.  LUNGS:  Clear to auscultation.  ABDOMEN:  Soft, nontender.  NEUROLOGIC:  She is pleasant, awake, alert, cooperative.  She is  oriented to time, place, and person.  She has intact memory short-term  as well as long-term and good recall of current events.  She knows where  she is.  She knows who the president is.  Eye movements are full range.  There is no nystagmus.  Visual acuity and fields are accurate.  Face is  symmetric.  Tongue is midline.  Motor system exam reveals no upper  extremity drift, symmetric strength, tone, reflexes, coordination,  sensation.  Her gait was not tested.   DATA REVIEWED:  CT scan of the head is unremarkable for any acute  abnormalities.  Coagulation lab, WBC count is normal.  Glucose is 111.   IMPRESSION:  A 77 year old lady with brief episode of transient short-  term memory loss and confusion which was unwitnessed.  Possibilities  include an unwitnessed seizure with postictal confusion versus complex  partial seizure, vertigo, but TIA is less likely.   PLAN:  The patient will be admitted for observation.  We will check an  MRI scan of the brain with MRA of the brain and neck.  Then keep her on  telemetry monitoring, start her on coated baby aspirin a day.  Outpatient EEG for seizure.  I had a long discussion with the patient  and multiple family members and answered questions.   I will be happy to follow the patient.  Kindly call for questions.           ______________________________  Sunny Schlein. Pearlean Brownie, MD     PPS/MEDQ  D:  09/07/2008  T:  09/08/2008  Job:   [id number]

## 2010-10-07 NOTE — Procedures (Signed)
EEG NUMBER:  [id number].   CLINICAL HISTORY:  The patient is a 77 year old with an episode of  memory loss.  Study is being done to look for presence of seizures.   PROCEDURE:  The tracing is carried out of a 32-digital Cadwell recorder  reformatted into 16-channel montages with 1 devoted to EKG.  The patient  was awake during the recording.  The International 10/20 System lead  placement was used.   MEDICATIONS:  Aspirin, Zocor, Femara, and Zantac.   DESCRIPTION OF FINDINGS:  Dominant frequency is 10 Hz, 25 microvolt  activity is well modulated, regulated, and attenuates partially with eye  opening.  Background activity is predominately alpha and upper theta  range activity with frontally predominant beta range components.   There was no focal slowing in the background.  There was no interictal  epileptiform activity in the form of spikes or sharp waves.  Artifact  from time to time gave sharp contours to the temporal regions, but I do  not believe seizure activity was seen there.   Activating procedures with photic stimulation failed to induce a driving  response.  Hyperventilation caused no significant change in background.   EKG showed regular sinus rhythm with ventricular response of 66 beats  per minute.   IMPRESSION:  Normal waking record.       Deanna Artis. Sharene Skeans, M.D.  Electronically Signed     GMW:NUUV  D:  09/14/2008 11:54:19  T:  09/14/2008 23:08:33  Job #:  [id number]   cc:   Marne A. Tower, MD  [address] [place], [place] [address]

## 2010-10-07 NOTE — Op Note (Signed)
NAMEMARGEAUX, Lauren [name] NO.:  [id number]   MEDICAL RECORD NO.:  [id number]          PATIENT TYPE:  AMB   LOCATION:  SDS                          FACILITY:  [facility]   PHYSICIAN:  Sandria Bales. Ezzard Standing, M.D.  DATE OF BIRTH:  [birth year]   DATE OF PROCEDURE:  10/25/2007  DATE OF DISCHARGE:                               OPERATIVE REPORT   Date of Surgery ??   PREOPERATIVE DIAGNOSIS:  Left breast cancer, 8 mm in size, 6 o'clock  position.   POSTOPERATIVE DIAGNOSIS:  Left breast cancer, 8 mm in size, 6 o'clock  position. (T1 M1 N0), negative sentinel lymph node per Dr. Berneta Levins.   PROCEDURE:  Injection of methylene blue, left axillary sentinel lymph  node biopsy (counts 300 background 4), left breast partial mastectomy.   SURGEON:  Sandria Bales. Ezzard Standing, M.D.   FIRST ASSISTANT:  None.   ANESTHESIA:  General, LMA with approximately 10 cc of 0.25% Marcaine.   COMPLICATIONS:  None.   INDICATIONS FOR PROCEDURE:  Ms. Goodness is a 77 year old white female  patient of Dr. Roxy Manns who had a palpable mass in the inferior  aspect of her left breast which underwent a core biopsy which showed an  invasive ductal carcinoma.   Her tumor measured approximately 8 mm by ultrasound 1.3 cm x MRI.  She  appeared to be a candidate for a lumpectomy, sentinel lymph node biopsy.   I discussed with her the indications, potential complications of  surgery.  Potential complications include, but are not limited to,  bleeding, infection, nerve injury, recurrence of the tumor, and the need  for wide excision of this primary tumor.   I also discussed with her sentinel lymph node biopsy.  If the biopsy is  positive, she will have axillary node dissection.  I discussed with her  complications of infection, bleeding, nerve injury, the need to go back  to do further lymph node dissections and refer to pathology.  It is  different than the initial pathology.   OPERATIVE NOTE:  The patient presented  to the holding area.  She had her  left breast injected with 1 mCi of Technetium - sulfa colloid.  I marked  her left breast.   She underwent general anesthesia with an LMA.  Her left breast was  prepped with Betadine solution and sterilely draped.  A time out was  held identifying the patient and the procedure.  Prior to the prep, the  first thing I used a 7.5 mHz ultrasound probe to identify the area I was  palpating to make sure it is the tumor.  This was right at the 6 o'clock  position immediately above the inframammary fold.  Secondly, I injected  her periareolar area with 40% methylene blue using about 3 cc for  identification of the Sentinel lymph node.  Thirdly, I used a Neoprobe  and identified what looked like a hot spot  high in the left axilla  consistent with sentinel lymph node.   I prepped the left breast.  I first found the sentinel lymph node and  made  an incision in the axilla, dissected down and found a block of fat  where it looked like there would be at least one, maybe two lymph nodes.  I put a suture near the lymph node that is hottest with counts about  300, background had about 4.  I sent this down to Pathology.  Dr. Charlean Sanfilippo called back and said that the sentinel lymph node was negative for  tumor.  I then packed the wound off.  I then turned my attention to the  breast itself.   I made an incision in the inferior left breast, excising a block of  breast tissue, approximately 4 cm in diameter.  I thought that I got  around 1 cm around all the margins.  I left a little piece of skin for  anterior orientation.  I removed the breast to the chest wall  posteriorly.  Cranially, I  marked with a short suture medial, medially  I marked with a long suture.   These were sent to Pathology for permanent evaluation of the margins.  The wound itself was irrigated with saline.  There was no bleeding.  Hemostasis had been controlled with Bovie electrocautery.   I  then tried to free up a little bit of the edges of the breast so they  would pull together.  I closed the subcutaneous tissues with 3-0 Vicryl  suture, the skin with a 5-0 Vicryl suture.  I painted the wound with a  tincture of Benzoin and Steri-strips.   I then also irrigated the left axilla and closed that with interrupted 3-  0 Vicryl sutures, skin with a 5-0 Monocryl suture and painted that with  a tincture of Benzoin.  The patient tolerated both procedures well.   Her final pathology is pending on the margins of the primary tumor.  Her  axillary sentinel lymph node was negative.  Her wound was sterilely  dressed with 4x4s, a tincture of Benzoin and Steri-Strips.  The wound  was then sterilely dressed, and her chest was wrapped.   She was transferred to the recovery room in good condition.      Sandria Bales. Ezzard Standing, M.D.  Electronically Signed     DHN/MEDQ  D:  10/25/2007  T:  10/25/2007  Job:  [id number]   cc:   Marne A. Tower, MD  [address] [place], [place] [address]

## 2010-10-08 ENCOUNTER — Ambulatory Visit: Payer: Self-pay | Admitting: Family Medicine

## 2010-10-09 ENCOUNTER — Ambulatory Visit: Payer: Self-pay | Admitting: Family Medicine

## 2010-10-10 ENCOUNTER — Ambulatory Visit: Payer: Self-pay | Admitting: Family Medicine

## 2010-10-10 NOTE — Op Note (Signed)
[name]  Patient:    Lauren [name], Lauren [name]                         MRN: [id number] Proc. Date: 08/04/99 Attending:  Rose Phi. Maple Hudson, M.D.                           Operative Report  PREOPERATIVE DIAGNOSIS:  Intra-abductal papilloma of the right breast.  POSTOPERATIVE DIAGNOSIS:  Intra-abductal papilloma of the right breast.  OPERATION:  Excision of intra-abductal papilloma of the right breast.  SURGEON:  Rose Phi. Maple Hudson, M.D.  ANESTHESIA:  MAC.  OPERATIVE PROCEDURE:  This patient had presented with burning nipple discharge,  which on evaluation had been centered at about the 7 oclock position of the right breast.  The patient was placed on the operating table with right arm extended on the arm board.  The breast was prepped and draped in the usual sterile fashion.  A circumareolar incision centered at the 7 oclock position was outlined, and then  the area infiltrated with 1% Xylocaine.  The incision was made and we then dissected under the areolar to the nipple, and then as I cut across the duct I could see the papilloma extruding.  I made sure we excised that whole duct and  wedge of breast tissue underneath it.  Hemostasis was obtained with electrocautery. Subcuticular closure with 4-0 monocryl and Steri-Strips carried out.  Dressing applied.  The patient was taken to surgery recovery room in satisfactory condition, having tolerated the procedure well. DD:  08/04/99 TD:  08/03/99 Job: [id number]

## 2010-10-10 NOTE — Op Note (Signed)
NAME:  SIBONEY, REQUEJO NO.:  [id number]   MEDICAL RECORD NO.:  [id number]                   PATIENT TYPE:  AMB   LOCATION:  ENDO                                 FACILITY:  [facility]   PHYSICIAN:  Petra Kuba, M.D.                 DATE OF BIRTH:  Dec 14, 1927   DATE OF PROCEDURE:  06/07/2002  DATE OF DISCHARGE:                                 OPERATIVE REPORT   PROCEDURE:  Colonoscopy.   INDICATIONS FOR PROCEDURE:  Patient due for colonic screening with increased  constipation.  The consent was signed after the risks, methods, benefits and  options which were rediscussed in the office.   MEDICATIONS:  Demerol 50 mg and Versed 5 mg.   DESCRIPTION OF PROCEDURE:  Rectal inspection was negative for rectal  hemorrhage.  Digital exam was made.  The video adjustable colonoscope was  inserted with some difficulty due to her tortuous colon, but was able to be  advanced around the colon to the cecum. This did require rolling the patient  onto her back and then on her right side with various abdominal pressures.  Other than some left sided diverticula no abnormalities were seen.  On  insertion, the cecum was identified by the appendiceal orifice and ileocecal  valve.  The scope was slowly withdrawn.  The prep was fairly adequate and  did require some washing and suctioning, particularly with some Mylicon  washed from bubbles, but we felt back around the tortuous root. We did try  to readvance around the curve to decrease the chance of missing things.  The  scope was slowly withdrawn back to the rectum and other than the left sided  diverticula no abnormalities were seen.  Once back in the rectum the scope  was retroflexed and pertinent for some internal hemorrhoids. The scope was  then readvanced slowly up towards the left side of the colon and the airway  was suctioned and the scope was removed.  The patient tolerated the  procedure well.  There was no evidence  of any complications.   ENDOSCOPIC DIAGNOSES:  1. Internal and external hemorrhoids.  2. Left sided diverticula.  3. Very tortuous colon.  4. Otherwise within normal limits to the cecum.    PLAN:  I will be happy to see this patient back p.r.n.  Return to Dr. Milinda Antis  for continuation of health care maintenance to include yearly rectal exams  and guaiac.  Consider repeat screening in five to 10 years if doing well  medically.  If a  retrocolonoscopy is available, she might be a good  candidate based on tortuosity.                                               Lavada Mesi  Magod, M.D.    MEM/MEDQ  D:  06/07/2002  T:  06/07/2002  Job:  [id number]   cc:   Marne A. Tower, M.D. [facility]  [address], [place]  [place] [address]  Fax: 1

## 2010-10-16 ENCOUNTER — Ambulatory Visit
Admission: RE | Admit: 2010-10-16 | Discharge: 2010-10-16 | Disposition: A | Discharge status: Home / Self Care | Payer: Medicare Other | Source: Ambulatory Visit | Attending: Oncology | Admitting: Oncology

## 2010-10-16 ENCOUNTER — Encounter (INDEPENDENT_AMBULATORY_CARE_PROVIDER_SITE_OTHER): Payer: Self-pay | Admitting: Surgery

## 2010-10-16 DIAGNOSIS — Z1231 Encounter for screening mammogram for malignant neoplasm of breast: Secondary | ICD-10-CM

## 2010-10-28 ENCOUNTER — Other Ambulatory Visit: Payer: Self-pay | Admitting: *Deleted

## 2010-10-28 MED ORDER — SIMVASTATIN 20 MG PO TABS: 20.0000 mg | ORAL_TABLET | Freq: Every day | ORAL | Status: DC

## 2010-12-18 ENCOUNTER — Other Ambulatory Visit (INDEPENDENT_AMBULATORY_CARE_PROVIDER_SITE_OTHER): Payer: Medicare Other

## 2010-12-18 DIAGNOSIS — E119 Type 2 diabetes mellitus without complications: Secondary | ICD-10-CM

## 2010-12-18 DIAGNOSIS — K2970 Gastritis, unspecified, without bleeding: Secondary | ICD-10-CM

## 2010-12-18 DIAGNOSIS — E7800 Pure hypercholesterolemia, unspecified: Secondary | ICD-10-CM

## 2010-12-18 LAB — CBC WITH DIFFERENTIAL/PLATELET
Basophils Relative: 1.3 % (ref 0.0–3.0)
Eosinophils Absolute: 0.3 10*3/uL (ref 0.0–0.7)
Lymphocytes Relative: 20.4 % (ref 12.0–46.0)
MCHC: 32.9 g/dL (ref 30.0–36.0)
Monocytes Absolute: 0.7 10*3/uL (ref 0.1–1.0)
Neutrophils Relative %: 68.4 % (ref 43.0–77.0)
Platelets: 180 10*3/uL (ref 150.0–400.0)
RBC: 4.43 Mil/uL (ref 3.87–5.11)
WBC: 10.5 10*3/uL (ref 4.5–10.5)

## 2010-12-18 LAB — RENAL FUNCTION PANEL
Albumin: 4.1 g/dL (ref 3.5–5.2)
BUN: 17 mg/dL (ref 6–23)
CO2: 29 meq/L (ref 19–32)
Calcium: 9.8 mg/dL (ref 8.4–10.5)
Chloride: 104 meq/L (ref 96–112)
Creatinine, Ser: 0.5 mg/dL (ref 0.4–1.2)
GFR: 115.18 mL/min
Glucose, Bld: 96 mg/dL (ref 70–99)
Phosphorus: 3.4 mg/dL (ref 2.3–4.6)
Potassium: 4.7 meq/L (ref 3.5–5.1)
Sodium: 138 meq/L (ref 135–145)

## 2010-12-18 LAB — LIPID PANEL
Cholesterol: 115 mg/dL (ref 0–200)
LDL Cholesterol: 49 mg/dL (ref 0–99)
Triglycerides: 100 mg/dL (ref 0.0–149.0)
VLDL: 20 mg/dL (ref 0.0–40.0)

## 2010-12-18 LAB — HEPATIC FUNCTION PANEL
ALT: 22 U/L (ref 0–35)
AST: 20 U/L (ref 0–37)
Albumin: 4.1 g/dL (ref 3.5–5.2)
Alkaline Phosphatase: 62 U/L (ref 39–117)
Bilirubin, Direct: 0 mg/dL (ref 0.0–0.3)
Total Bilirubin: 0.8 mg/dL (ref 0.3–1.2)
Total Protein: 7.5 g/dL (ref 6.0–8.3)

## 2010-12-18 LAB — HEMOGLOBIN A1C: Hgb A1c MFr Bld: 7.1 % — ABNORMAL HIGH (ref 4.6–6.5)

## 2010-12-23 ENCOUNTER — Encounter: Payer: Self-pay | Admitting: Family Medicine

## 2010-12-23 ENCOUNTER — Ambulatory Visit (INDEPENDENT_AMBULATORY_CARE_PROVIDER_SITE_OTHER): Payer: Medicare Other | Admitting: Family Medicine

## 2010-12-23 DIAGNOSIS — E7800 Pure hypercholesterolemia, unspecified: Secondary | ICD-10-CM

## 2010-12-23 DIAGNOSIS — E119 Type 2 diabetes mellitus without complications: Secondary | ICD-10-CM

## 2010-12-23 DIAGNOSIS — R1310 Dysphagia, unspecified: Secondary | ICD-10-CM

## 2010-12-23 MED ORDER — METFORMIN HCL 500 MG PO TABS: 500.0000 mg | ORAL_TABLET | Freq: Two times a day (BID) | ORAL | Status: DC

## 2010-12-23 NOTE — Progress Notes (Signed)
Subjective:    Patient ID: Lauren [name], female    DOB: [birth year], 77 y.o.   MRN: [id number]  HPI Here for f/u of DM and lipids   Is feeling good - nothing new going on   In interim went to GI for swallowing problems Had swallowing study reviewed and 2nd one done that was ok  He was not worried about pathology She still feels like there is a tightness in R side of throat  Had opt of watching it/ doing EGD or seeing ENT   Taking pills with applesauce and that helps   Lipids in very good control  Walking twice per day  Lab Results  Component Value Date   CHOL 115 12/18/2010   CHOL 127 04/08/2010   CHOL  Value: 125        ATP III CLASSIFICATION:  <200     mg/dL   Desirable  409-811  mg/dL   Borderline High  >=914    mg/dL   High        11/30/2954   Lab Results  Component Value Date   HDL 46.40 12/18/2010   HDL 21.30* 04/08/2010   HDL 38* 10/25/2009   Lab Results  Component Value Date   LDLCALC 49 12/18/2010   LDLCALC 59 04/08/2010   LDLCALC  Value: 41        Total Cholesterol/HDL:CHD Risk Coronary Heart Disease Risk Table                     Men   Women  1/2 Average Risk   3.4   3.3  Average Risk       5.0   4.4  2 X Average Risk   9.6   7.1  3 X Average Risk  23.4   11.0        Use the calculated Patient Ratio above and the CHD Risk Table to determine the patient's CHD Risk.        ATP III CLASSIFICATION (LDL):  <100     mg/dL   Optimal  865-784  mg/dL   Near or Above                    Optimal  130-159  mg/dL   Borderline  696-295  mg/dL   High  >284     mg/dL   Very High 05/27/2438   Lab Results  Component Value Date   TRIG 100.0 12/18/2010   TRIG 156.0* 04/08/2010   TRIG 229* 10/25/2009   Lab Results  Component Value Date   CHOLHDL 2 12/18/2010   CHOLHDL 3 04/08/2010   CHOLHDL 3.3 10/25/2009   Lab Results  Component Value Date   LDLDIRECT 46.3 10/01/2009      Vit D level 51 which is good Is compliant with ca and D   DM is worse with a1c of 7.1 up from 6.6  ? If from  the hospitalization  Is watching diet and walking (since her home therapist released her )  Diet is good - eating well  Possible that she is eating more sugar  No inc thirst or urination  Patient Active Problem List  Diagnoses  . NEOPLASM, MALIGNANT, BREAST  . DIABETES MELLITUS, TYPE II  . HYPERCHOLESTEROLEMIA, PURE  . MIGRAINE VARIANT  . TIA  . VENOUS INSUFFICIENCY  . DIVERTICULOSIS, COLON  . FIBROCYSTIC BREAST DISEASE  . DEGENERATIVE JOINT DISEASE  . OSTEOPENIA  . Dysphagia  . Pelvic fracture  .  Dog bite   Past Medical History  Diagnosis Date  . Diabetes mellitus   . Gout     injection in the past  . Fibrocystic breast   . Venous insufficiency   . Hyperlipidemia   . Breast cancer   . TIA (transient ischemic attack) 08/2008    neg MRI/MRA and CT and carotid dopplers  . Chronic constipation   . Gastritis   . Chronic headaches   . Pelvic fracture    Past Surgical History  Procedure Date  . Breast cyst aspiration   . Eye surgery     retinal, then cataract  . Breast lumpectomy 09/2007    with sentinel LN biopsy   History  Substance Use Topics  . Smoking status: Never Smoker   . Smokeless tobacco: Not on file  . Alcohol Use: No   Family History  Problem Relation Age of Onset  . Breast cancer Sister   . Breast cancer Sister    Allergies  Allergen Reactions  . Aspirin     REACTION: GI  . Sulfonamide Derivatives     REACTION: itching   Current Outpatient Prescriptions on File Prior to Visit  Medication Sig Dispense Refill  . aspirin 81 MG tablet Take 81 mg by mouth daily.        . Calcium Carbonate-Vit D-Min 600-400 MG-UNIT CHEW Chew by mouth 2 (two) times daily.        . citalopram (CELEXA) 10 MG tablet Take 1 tablet (10 mg total) by mouth daily.  30 tablet  11  . fish oil-omega-3 fatty acids 1000 MG capsule Take 1 g by mouth daily.        Marland Kitchen glucose blood (FREESTYLE TEST STRIPS) test strip 1 each by Other route as needed. Use as instructed       .  letrozole (FEMARA) 2.5 MG tablet Take 2.5 mg by mouth daily.        . Multiple Vitamins-Minerals (PRESERVISION/LUTEIN) CAPS Take by mouth 2 (two) times daily.        Marland Kitchen omeprazole (PRILOSEC) 20 MG capsule Take 1 capsule (20 mg total) by mouth daily.  30 capsule  11  . polyethylene glycol (GLYCOLAX) packet Take 17 g by mouth as needed.        . simvastatin (ZOCOR) 20 MG tablet Take 1 tablet (20 mg total) by mouth at bedtime.  30 tablet  2  . acetaminophen (TYLENOL) 500 MG tablet Take 500 mg by mouth every 6 (six) hours as needed.             Review of Systems Review of Systems  Constitutional: Negative for fever, appetite change, fatigue and unexpected weight change.  Eyes: Negative for pain and visual disturbance.  Respiratory: Negative for cough and shortness of breath.  pos for swallowing discomfort  Cardiovascular: Negative.for cp or sob    Gastrointestinal: Negative for nausea, diarrhea and constipation.  Genitourinary: Negative for urgency and frequency.  Skin: Negative for pallor. or rash  Neurological: Negative for weakness, light-headedness, numbness and headaches.  Hematological: Negative for adenopathy. Does not bruise/bleed easily.  Psychiatric/Behavioral: Negative for dysphoric mood. The patient is not nervous/anxious.         Objective:   Physical Exam  Constitutional: She appears well-developed and well-nourished. No distress.  HENT:  Head: Normocephalic and atraumatic.  Mouth/Throat: Oropharynx is clear and moist.  Eyes: Conjunctivae and EOM are normal. Pupils are equal, round, and reactive to light.  Neck: Normal range of motion. Neck supple.  No JVD present. Carotid bruit is not present. No thyromegaly present.  Cardiovascular: Normal rate, regular rhythm, normal heart sounds and intact distal pulses.   Pulmonary/Chest: Effort normal and breath sounds normal. No respiratory distress. She has no wheezes. She exhibits no tenderness.  Abdominal: Soft. Bowel sounds are  normal. She exhibits no distension and no mass. There is no tenderness.  Musculoskeletal: She exhibits no edema and no tenderness.  Lymphadenopathy:    She has no cervical adenopathy.  Neurological: She is alert. She has normal reflexes. No cranial nerve deficit. Coordination normal.  Skin: Skin is warm and dry. No rash noted. No erythema. No pallor.  Psychiatric: She has a normal mood and affect.          Assessment & Plan:   No problem-specific assessment & plan notes found for this encounter.

## 2010-12-23 NOTE — Assessment & Plan Note (Signed)
a1c is up poss due to stress of pelvic fx and hosp  Pt does not want to cut diet back in fear of wt loss Inc metformin to bid and watch carefully Check sugar daily- diff times F/u after a1c 3 mo

## 2010-12-23 NOTE — Patient Instructions (Signed)
We will do ENT doctor referral at check out for swallowing problems Increase your metformin to 500 mg twice daily  Check blood sugars once daily- some am and some pm  Schedule f/u with labs prior in 3 months  Update me if any problems or side effects

## 2010-12-23 NOTE — Assessment & Plan Note (Signed)
Excellent control with statin and diet Rev labs with pt Rev low sat fat diet

## 2010-12-23 NOTE — Assessment & Plan Note (Signed)
Continues to have R sided throat discomfort S/p GI eval with 2 swallowing studies  Pt would like ref to ENT for further eval and did that  Will continue taking meds with applesauce - which helps

## 2011-01-08 ENCOUNTER — Ambulatory Visit (INDEPENDENT_AMBULATORY_CARE_PROVIDER_SITE_OTHER): Payer: Medicare Other | Admitting: Surgery

## 2011-01-08 ENCOUNTER — Encounter (INDEPENDENT_AMBULATORY_CARE_PROVIDER_SITE_OTHER): Payer: Self-pay | Admitting: Surgery

## 2011-01-08 VITALS — Wt 114.0 lb

## 2011-01-08 DIAGNOSIS — Z853 Personal history of malignant neoplasm of breast: Secondary | ICD-10-CM | POA: Insufficient documentation

## 2011-01-08 NOTE — Progress Notes (Signed)
Chief Complaint  Patient presents with  . Other    f/u left br reck lumpectomy    Lauren [name] is a 77 y.o. (DOB: 1927/08/19)  white female who is a patient of Roxy Manns, MD, MD and comes to me today for left breast cancer.  Path (side, TNM): T1b, N0 Left  Surgery: lumpectomy, left  Date: 10/25/2010 Size of tumor: 0.8 cm Nodes: 0/2 ER: 73% PR: 93% Ki67: 10  HER2Neu: neg  Medical Oncologist: Donnie Coffin Radiation Oncologist: Kinard  She was attacked by a neighbor's dog, who knocked her down and fractured her pelvis, in March.  She spent one week in hospital and 5 weeks in nursing home.  She got over this late spring.  She is doing well from her Breast cancer.  Past Medical History  Diagnosis Date  . Diabetes mellitus   . Gout     injection in the past  . Fibrocystic breast   . Venous insufficiency   . Hyperlipidemia   . Breast cancer   . TIA (transient ischemic attack) 08/2008    neg MRI/MRA and CT and carotid dopplers  . Chronic constipation   . Gastritis   . Chronic headaches   . Pelvic fracture     Current Outpatient Prescriptions  Medication Sig Dispense Refill  . acetaminophen (TYLENOL) 500 MG tablet Take 500 mg by mouth every 6 (six) hours as needed.        Marland Kitchen aspirin 81 MG tablet Take 81 mg by mouth daily.        . Calcium Carbonate-Vit D-Min 600-400 MG-UNIT CHEW Chew by mouth 2 (two) times daily.        . citalopram (CELEXA) 10 MG tablet Take 1 tablet (10 mg total) by mouth daily.  30 tablet  11  . fish oil-omega-3 fatty acids 1000 MG capsule Take 1 g by mouth daily.        Marland Kitchen glucose blood (FREESTYLE TEST STRIPS) test strip 1 each by Other route as needed. Use as instructed       . letrozole (FEMARA) 2.5 MG tablet Take 2.5 mg by mouth daily.        . metFORMIN (GLUCOPHAGE) 500 MG tablet Take 1 tablet (500 mg total) by mouth 2 (two) times daily with a meal.  60 tablet  11  . Multiple Vitamins-Minerals (PRESERVISION/LUTEIN) CAPS Take by mouth 2 (two) times daily.          Marland Kitchen omeprazole (PRILOSEC) 20 MG capsule Take 1 capsule (20 mg total) by mouth daily.  30 capsule  11  . polyethylene glycol (GLYCOLAX) packet Take 17 g by mouth as needed.        . simvastatin (ZOCOR) 20 MG tablet Take 1 tablet (20 mg total) by mouth at bedtime.  30 tablet  2    Allergies  Allergen Reactions  . Aspirin     REACTION: GI  . Sulfonamide Derivatives     REACTION: itching    PHYSICAL EXAM: Wt 114 lb (51.71 kg)  HEENT: Normal. Pupils equal. Normal dentition. Neck: Supple. No thyroid mass. Lymph Nodes:  No supraclavicular or cervical nodes. No axillary mass or lymphadenopathy. Breast:  Scar at 6 o'clock left breast.  No mass.  No nipple discharge.  DATA REVIEWED: Mammogram 10/16/10 - neg.  ASSESSMENT and PLAN: 1.  Stage 1 left breast cancer.  3 years out and disease free.  Tolerating Femara well.  Followup with me in 3 months.  2.  Dog bite/pelvic fracture which  ruined her spring. 3.  History of TIA.  Doig okay.

## 2011-01-23 ENCOUNTER — Other Ambulatory Visit: Payer: Self-pay

## 2011-01-23 MED ORDER — SIMVASTATIN 20 MG PO TABS: 20.0000 mg | ORAL_TABLET | Freq: Every day | ORAL | Status: DC

## 2011-01-23 NOTE — Telephone Encounter (Signed)
[facility] faxed refill request Simvastatin 20 mg #30 x 6.

## 2011-02-03 ENCOUNTER — Encounter (INDEPENDENT_AMBULATORY_CARE_PROVIDER_SITE_OTHER): Payer: Medicare Other | Admitting: Ophthalmology

## 2011-02-03 DIAGNOSIS — E11319 Type 2 diabetes mellitus with unspecified diabetic retinopathy without macular edema: Secondary | ICD-10-CM

## 2011-02-03 DIAGNOSIS — H43819 Vitreous degeneration, unspecified eye: Secondary | ICD-10-CM

## 2011-02-03 DIAGNOSIS — H2510 Age-related nuclear cataract, unspecified eye: Secondary | ICD-10-CM

## 2011-02-03 DIAGNOSIS — H3530 Unspecified macular degeneration: Secondary | ICD-10-CM

## 2011-02-06 ENCOUNTER — Encounter (HOSPITAL_BASED_OUTPATIENT_CLINIC_OR_DEPARTMENT_OTHER): Payer: Medicare Other | Admitting: Oncology

## 2011-02-06 ENCOUNTER — Other Ambulatory Visit: Payer: Self-pay | Admitting: Oncology

## 2011-02-06 DIAGNOSIS — M810 Age-related osteoporosis without current pathological fracture: Secondary | ICD-10-CM

## 2011-02-06 DIAGNOSIS — C50919 Malignant neoplasm of unspecified site of unspecified female breast: Secondary | ICD-10-CM

## 2011-02-06 LAB — CBC WITH DIFFERENTIAL/PLATELET
Basophils Absolute: 0.1 10*3/uL (ref 0.0–0.1)
Eosinophils Absolute: 0.3 10*3/uL (ref 0.0–0.5)
HGB: 13.4 g/dL (ref 11.6–15.9)
MONO#: 0.8 10*3/uL (ref 0.1–0.9)
NEUT#: 8 10*3/uL — ABNORMAL HIGH (ref 1.5–6.5)
RDW: 14.1 % (ref 11.2–14.5)
WBC: 11.2 10*3/uL — ABNORMAL HIGH (ref 3.9–10.3)
lymph#: 2 10*3/uL (ref 0.9–3.3)

## 2011-02-07 LAB — CANCER ANTIGEN 27.29: CA 27.29: 31 U/mL (ref 0–39)

## 2011-02-07 LAB — COMPREHENSIVE METABOLIC PANEL
ALT: 22 U/L (ref 0–35)
CO2: 27 mEq/L (ref 19–32)
Calcium: 10.4 mg/dL (ref 8.4–10.5)
Chloride: 99 mEq/L (ref 96–112)
Creatinine, Ser: 0.92 mg/dL (ref 0.50–1.10)
Glucose, Bld: 114 mg/dL — ABNORMAL HIGH (ref 70–99)
Total Protein: 7.2 g/dL (ref 6.0–8.3)

## 2011-02-13 ENCOUNTER — Encounter (HOSPITAL_BASED_OUTPATIENT_CLINIC_OR_DEPARTMENT_OTHER): Payer: Medicare Other | Admitting: Oncology

## 2011-02-13 DIAGNOSIS — M810 Age-related osteoporosis without current pathological fracture: Secondary | ICD-10-CM

## 2011-02-13 DIAGNOSIS — C50919 Malignant neoplasm of unspecified site of unspecified female breast: Secondary | ICD-10-CM

## 2011-02-13 DIAGNOSIS — Z170 Estrogen receptor positive status [ER+]: Secondary | ICD-10-CM

## 2011-02-18 ENCOUNTER — Encounter (INDEPENDENT_AMBULATORY_CARE_PROVIDER_SITE_OTHER): Payer: Medicare Other | Admitting: Ophthalmology

## 2011-02-18 DIAGNOSIS — H3530 Unspecified macular degeneration: Secondary | ICD-10-CM

## 2011-02-18 DIAGNOSIS — H43819 Vitreous degeneration, unspecified eye: Secondary | ICD-10-CM

## 2011-02-18 DIAGNOSIS — E11319 Type 2 diabetes mellitus with unspecified diabetic retinopathy without macular edema: Secondary | ICD-10-CM

## 2011-02-19 LAB — CBC
HCT: 42
Hemoglobin: 14.3
MCHC: 34
MCV: 94.9
RBC: 4.42
RDW: 13.7

## 2011-02-19 LAB — BASIC METABOLIC PANEL
CO2: 32
Calcium: 10.5
Chloride: 105
GFR calc Af Amer: >60
Glucose, Bld: 73
Potassium: 3.8
Sodium: 142

## 2011-02-20 ENCOUNTER — Ambulatory Visit (HOSPITAL_COMMUNITY)
Admission: RE | Admit: 2011-02-20 | Discharge: 2011-02-20 | Disposition: A | Discharge status: Home / Self Care | Payer: Medicare Other | Source: Ambulatory Visit | Attending: Oncology | Admitting: Oncology

## 2011-02-20 DIAGNOSIS — M7989 Other specified soft tissue disorders: Secondary | ICD-10-CM

## 2011-02-23 ENCOUNTER — Telehealth: Payer: Self-pay | Admitting: *Deleted

## 2011-02-23 NOTE — Telephone Encounter (Signed)
Thanks for the update -I will be waiting on results

## 2011-02-23 NOTE — Telephone Encounter (Signed)
Pt called to let you know that she had an elbow ultrasound, ordered by Dr. Donnie Coffin, that you will be getting the results on as well.  She has some lumps in the bend of her left elbow, which he doesn't know what they are, concerned about clots.  She just wanted you know what was going on.

## 2011-03-03 ENCOUNTER — Ambulatory Visit (INDEPENDENT_AMBULATORY_CARE_PROVIDER_SITE_OTHER): Payer: Medicare Other

## 2011-03-03 DIAGNOSIS — Z23 Encounter for immunization: Secondary | ICD-10-CM

## 2011-03-16 ENCOUNTER — Ambulatory Visit: Payer: Medicare Other | Attending: Oncology | Admitting: Physical Therapy

## 2011-03-16 DIAGNOSIS — IMO0001 Reserved for inherently not codable concepts without codable children: Secondary | ICD-10-CM | POA: Insufficient documentation

## 2011-03-16 DIAGNOSIS — R229 Localized swelling, mass and lump, unspecified: Secondary | ICD-10-CM | POA: Insufficient documentation

## 2011-03-16 DIAGNOSIS — M25619 Stiffness of unspecified shoulder, not elsewhere classified: Secondary | ICD-10-CM | POA: Insufficient documentation

## 2011-03-16 DIAGNOSIS — I890 Lymphedema, not elsewhere classified: Secondary | ICD-10-CM | POA: Insufficient documentation

## 2011-03-18 ENCOUNTER — Other Ambulatory Visit (INDEPENDENT_AMBULATORY_CARE_PROVIDER_SITE_OTHER): Payer: Medicare Other

## 2011-03-18 DIAGNOSIS — E119 Type 2 diabetes mellitus without complications: Secondary | ICD-10-CM

## 2011-03-23 ENCOUNTER — Ambulatory Visit (INDEPENDENT_AMBULATORY_CARE_PROVIDER_SITE_OTHER): Payer: Medicare Other | Admitting: Family Medicine

## 2011-03-23 ENCOUNTER — Encounter: Payer: Self-pay | Admitting: Family Medicine

## 2011-03-23 DIAGNOSIS — E119 Type 2 diabetes mellitus without complications: Secondary | ICD-10-CM

## 2011-03-23 DIAGNOSIS — E7800 Pure hypercholesterolemia, unspecified: Secondary | ICD-10-CM

## 2011-03-23 DIAGNOSIS — R1310 Dysphagia, unspecified: Secondary | ICD-10-CM

## 2011-03-23 NOTE — Assessment & Plan Note (Signed)
Overall good control with metformin and diet Lab Results  Component Value Date   HGBA1C 6.7* 03/18/2011   Up to date opthy On asa  Good diet and activity level  Will f/u 6 mo after labs  Rev low glycemic diet

## 2011-03-23 NOTE — Patient Instructions (Signed)
Have [facility] call us for refil of your support stockings without toes  I'm glad your stomach and swallwing are a bit better  Keep up the good job with exercise and sugar control  Follow up in  6 months with labs prior

## 2011-03-23 NOTE — Assessment & Plan Note (Signed)
Pt has had both GI and ENT evals  She is learning to live with pill dysphagia in part caused by GERD Takes applesauce with pills -very helpful  Did inc her PPI to 40 mg and that is helping as well

## 2011-03-23 NOTE — Progress Notes (Signed)
Subjective:    Patient ID: Lauren [name], female    DOB: 1927/12/22, 77 y.o.   MRN: [id number]  HPI Here for f/u of DM and GERD with pill dysphagia  Is feeling good  Overall doing better   Saw Dr Donnie Coffin - for some little knots in her arm L - had ultrasound- no blood clots  Then went to [facility]/ PT - for lymphedema (her breast cancer was on that side) Not making much different  Ruled out the dangerous things   122/62 good bp Wt is stable with bmi of 19  DM is improved with a1c of 6.7  Diet - is very good - has made improvement Has also started walking with her cane since May-- goes when she can 20 min up to twice per day -- sometimes 30 minutes  On metformin - no low sugars , they stay pretty much 95-low 100s in am and afternoons are up to 130s (occ higher )  Had her eye exam in sept - no DM retinop, but does have some macular deg ,will need a cataract removal - Dr Ashley Royalty   Last lipids were well controlled on statin Lab Results  Component Value Date   CHOL 115 12/18/2010   HDL 46.40 12/18/2010   LDLCALC 49 12/18/2010   LDLDIRECT 46.3 10/01/2009   TRIG 100.0 12/18/2010   CHOLHDL 2 12/18/2010   she watches sat fats carefully  Stays very active  No cp or other symptoms  No statin side eff   Saw ENT for her dysphagia  Put on PPI- told to continue it- that GERD may be part of the cause Reassured that nothing was wrong  Most trouble with pills Will monitor   Patient Active Problem List  Diagnoses  . NEOPLASM, MALIGNANT, BREAST  . DIABETES MELLITUS, TYPE II  . HYPERCHOLESTEROLEMIA, PURE  . MIGRAINE VARIANT  . TIA  . VENOUS INSUFFICIENCY  . DIVERTICULOSIS, COLON  . FIBROCYSTIC BREAST DISEASE  . DEGENERATIVE JOINT DISEASE  . OSTEOPENIA  . Dysphagia  . Pelvic fracture  . Dog bite  . History of breast cancer, left   Past Medical History  Diagnosis Date  . Diabetes mellitus   . Gout     injection in the past  . Fibrocystic breast   . Venous insufficiency   .  Hyperlipidemia   . Breast cancer   . TIA (transient ischemic attack) 08/2008    neg MRI/MRA and CT and carotid dopplers  . Chronic constipation   . Gastritis   . Chronic headaches   . Pelvic fracture    Past Surgical History  Procedure Date  . Breast cyst aspiration   . Eye surgery     retinal, then cataract  . Breast lumpectomy 09/2007    with sentinel LN biopsy  . Appendectomy   . Breast lumpectomy     left   History  Substance Use Topics  . Smoking status: Never Smoker   . Smokeless tobacco: Never Used  . Alcohol Use: No   Family History  Problem Relation Age of Onset  . Breast cancer Sister   . Stroke Sister   . Breast cancer Sister   . Stroke Sister   . Stroke Mother   . Stroke Father   . Stroke Brother   . Stroke Brother   . Stroke Sister    Allergies  Allergen Reactions  . Aspirin     REACTION: GI  . Sulfonamide Derivatives     REACTION: itching  Current Outpatient Prescriptions on File Prior to Visit  Medication Sig Dispense Refill  . acetaminophen (TYLENOL) 500 MG tablet Take 500 mg by mouth every 6 (six) hours as needed.        Marland Kitchen aspirin 81 MG tablet Take 81 mg by mouth daily.        . citalopram (CELEXA) 10 MG tablet Take 1 tablet (10 mg total) by mouth daily.  30 tablet  11  . fish oil-omega-3 fatty acids 1000 MG capsule Take 1 g by mouth daily.        Marland Kitchen glucose blood (FREESTYLE TEST STRIPS) test strip 1 each by Other route as needed. Use as instructed       . letrozole (FEMARA) 2.5 MG tablet Take 2.5 mg by mouth daily.        . metFORMIN (GLUCOPHAGE) 500 MG tablet Take 1 tablet (500 mg total) by mouth 2 (two) times daily with a meal.  60 tablet  11  . Multiple Vitamins-Minerals (PRESERVISION/LUTEIN) CAPS Take by mouth 2 (two) times daily.        . polyethylene glycol (GLYCOLAX) packet Take 17 g by mouth as needed.        . simvastatin (ZOCOR) 20 MG tablet Take 1 tablet (20 mg total) by mouth at bedtime.  30 tablet  6     Review of  Systems Review of Systems  Constitutional: Negative for fever, appetite change, fatigue and unexpected weight change.  Eyes: Negative for pain and visual disturbance.  ENT - pos for trouble swallowing pills (takes applesauce with them) Respiratory: Negative for cough and shortness of breath.   Cardiovascular: Negative for cp or palpitations    Gastrointestinal: Negative for nausea, diarrhea and constipation. occ nausea from GERD Genitourinary: Negative for urgency and frequency.  Skin: Negative for pallor or rash   Neurological: Negative for weakness, light-headedness, numbness and headaches.  Hematological: Negative for adenopathy. Does not bruise/bleed easily.  Psychiatric/Behavioral: Negative for dysphoric mood. The patient tends to be a little anx         Objective:   Physical Exam  Constitutional: She appears well-developed and well-nourished. No distress.  HENT:  Head: Normocephalic and atraumatic.  Mouth/Throat: Oropharynx is clear and moist.  Eyes: Conjunctivae and EOM are normal. Pupils are equal, round, and reactive to light.  Neck: Normal range of motion. Neck supple. No JVD present. Carotid bruit is not present. No thyromegaly present.  Cardiovascular: Normal rate, regular rhythm, normal heart sounds and intact distal pulses.  Exam reveals no gallop.   Pulmonary/Chest: Effort normal and breath sounds normal. No respiratory distress. She has no wheezes. She exhibits no tenderness.  Abdominal: Soft. Bowel sounds are normal. She exhibits no distension, no abdominal bruit and no mass. There is no tenderness.  Musculoskeletal: Normal range of motion. She exhibits no edema and no tenderness.       Some small soft bumps in antecubital area of L arm   Lymphadenopathy:    She has no cervical adenopathy.  Neurological: She is alert. She has normal reflexes. No cranial nerve deficit. She exhibits normal muscle tone. Coordination normal.  Skin: Skin is warm and dry. No rash noted. No  erythema. No pallor.  Psychiatric: She has a normal mood and affect.          Assessment & Plan:

## 2011-03-23 NOTE — Assessment & Plan Note (Signed)
Good control with low sat fat diet and statin No side eff Rev last lab with pt  Habits are fairly good

## 2011-03-25 ENCOUNTER — Ambulatory Visit: Payer: Medicare Other | Admitting: Family Medicine

## 2011-03-30 ENCOUNTER — Encounter: Payer: Medicare Other | Admitting: Physical Therapy

## 2011-04-29 ENCOUNTER — Telehealth: Payer: Self-pay | Admitting: *Deleted

## 2011-04-29 NOTE — Telephone Encounter (Signed)
spoke to patient patient requested to mail the calendar mailed out on 04-29-2011

## 2011-05-27 ENCOUNTER — Other Ambulatory Visit: Payer: Self-pay | Admitting: Dermatology

## 2011-05-27 DIAGNOSIS — L819 Disorder of pigmentation, unspecified: Secondary | ICD-10-CM | POA: Diagnosis not present

## 2011-05-27 DIAGNOSIS — L820 Inflamed seborrheic keratosis: Secondary | ICD-10-CM | POA: Diagnosis not present

## 2011-05-27 DIAGNOSIS — L821 Other seborrheic keratosis: Secondary | ICD-10-CM | POA: Diagnosis not present

## 2011-05-27 DIAGNOSIS — D239 Other benign neoplasm of skin, unspecified: Secondary | ICD-10-CM | POA: Diagnosis not present

## 2011-06-01 DIAGNOSIS — H25049 Posterior subcapsular polar age-related cataract, unspecified eye: Secondary | ICD-10-CM | POA: Diagnosis not present

## 2011-06-01 DIAGNOSIS — E119 Type 2 diabetes mellitus without complications: Secondary | ICD-10-CM | POA: Diagnosis not present

## 2011-06-23 ENCOUNTER — Telehealth: Payer: Self-pay

## 2011-06-23 NOTE — Telephone Encounter (Signed)
Start back as soon as her eye doctor says it is safe to

## 2011-06-23 NOTE — Telephone Encounter (Signed)
Pt stopped Asa 81 mg on 06/15/11 for cataract surgery on 06/24/11. When should pt start back on her Jonne Ply after cataract surgery. Pt can be reached at [phone].

## 2011-06-23 NOTE — Telephone Encounter (Signed)
Patient notified as instructed by telephone. 

## 2011-06-24 DIAGNOSIS — H3530 Unspecified macular degeneration: Secondary | ICD-10-CM | POA: Diagnosis not present

## 2011-06-24 DIAGNOSIS — E11319 Type 2 diabetes mellitus with unspecified diabetic retinopathy without macular edema: Secondary | ICD-10-CM | POA: Diagnosis not present

## 2011-06-24 DIAGNOSIS — IMO0002 Reserved for concepts with insufficient information to code with codable children: Secondary | ICD-10-CM | POA: Diagnosis not present

## 2011-06-24 DIAGNOSIS — H2510 Age-related nuclear cataract, unspecified eye: Secondary | ICD-10-CM | POA: Diagnosis not present

## 2011-06-24 DIAGNOSIS — H2589 Other age-related cataract: Secondary | ICD-10-CM | POA: Diagnosis not present

## 2011-06-24 DIAGNOSIS — E1139 Type 2 diabetes mellitus with other diabetic ophthalmic complication: Secondary | ICD-10-CM | POA: Diagnosis not present

## 2011-06-30 ENCOUNTER — Encounter: Payer: Self-pay | Admitting: Family Medicine

## 2011-06-30 ENCOUNTER — Ambulatory Visit (INDEPENDENT_AMBULATORY_CARE_PROVIDER_SITE_OTHER): Payer: Medicare Other | Admitting: Family Medicine

## 2011-06-30 DIAGNOSIS — R040 Epistaxis: Secondary | ICD-10-CM

## 2011-06-30 NOTE — Patient Instructions (Signed)
Keep the pack in for now and go to the ENT clinic at 2pm today.  Keep pressure on your nose as needed.

## 2011-06-30 NOTE — Progress Notes (Signed)
Nosebleed.  H/o bleeds prev in last few months.  Started this AM at 8:30, R nostril.  Had mostly anterior and some posterior drainage.  No trauma.  On ASA 81mg .  H/o TIAs, on 81mg  ASA for about 1 year.    Feeling well o/w.    She started to bleed again in the office.   Meds, vitals, and allergies reviewed.   ROS: See HPI.  Otherwise, noncontributory.  ncat Tm wnl OP wnl R nostril with bleeding noted, unable to identify origin. R nostril packed with ribbon guaze covered with neosporin.  Minimal oozing after packing and tolerated

## 2011-07-01 ENCOUNTER — Encounter: Payer: Self-pay | Admitting: Family Medicine

## 2011-07-01 DIAGNOSIS — R040 Epistaxis: Secondary | ICD-10-CM | POA: Insufficient documentation

## 2011-07-01 NOTE — Assessment & Plan Note (Signed)
D/w pt. Refer to ENT, will be seen same day.  She has a driver. Continue pressure on nose. She agrees.

## 2011-07-17 ENCOUNTER — Ambulatory Visit (INDEPENDENT_AMBULATORY_CARE_PROVIDER_SITE_OTHER): Payer: Medicare Other | Admitting: Surgery

## 2011-07-17 ENCOUNTER — Encounter (INDEPENDENT_AMBULATORY_CARE_PROVIDER_SITE_OTHER): Payer: Self-pay | Admitting: Surgery

## 2011-07-17 VITALS — BP 112/68 | HR 84 | Temp 98.0°F | Resp 18 | Ht 63.0 in | Wt 111.6 lb

## 2011-07-17 DIAGNOSIS — C50919 Malignant neoplasm of unspecified site of unspecified female breast: Secondary | ICD-10-CM | POA: Diagnosis not present

## 2011-07-17 NOTE — Progress Notes (Signed)
[facility]  Ovidio Kin, MD,  FACS [address] [place]  [address] [place], [place]    [address] Phone:  [phone] FAX:  [phone]   Re:   Lauren [name] DOB:   30-Mar-1928 MRN:   [id number]  ASSESSMENT AND PLAN: 1. Stage 1b, N0 left breast cancer.   Left lumpectomy 10/25/2007.  3 1/2 years out and disease free.   Tolerating Femara well.   Followup with me in 6 months.   2.  Dog bite/pelvic fracture - March 2012..  3.  History of TIA. Doing okay. 4.  She had left eye cataract done 3 weeks ago.  HISTORY OF PRESENT ILLNESS: Chief Complaint  Patient presents with  . Breast Cancer Long Term Follow Up    L breast lumpectomy    Lauren [name] is a 78 y.o. (DOB: 17-Mar-1928)  white female who is a patient of Roxy Manns, MD, MD and comes to me today for follow up of left breast cancer.  She saw Dr. Lyda Jester for radiation tx, but decided against this.  She is on Femara.  Doing well.  Her only complaint is some swelling in her left antecubital fossa.  Path (side, TNM): T1b, N0 Left  Surgery: lumpectomy, left   Date: 10/25/2007  Size of tumor: 0.8 cm  Nodes: 0/2  ER: 73% PR: 93% Ki67: 10 HER2Neu: neg  Medical Oncologist: Donnie Coffin   Radiation Oncologist: Kinard  PHYSICAL EXAM: BP 112/68  Pulse 84  Temp(Src) 98 F (36.7 C) (Temporal)  Resp 18  Ht 5\' 3"  (1.6 m)  Wt 111 lb 9.6 oz (50.621 kg)  BMI 19.77 kg/m2  HEENT:  Pupils equal.  Dentition good.  No injury. NECK:  Supple.  No thyroid mass. LYMPH NODES:  No cervical, supraclavicular, or axillary adenopathy. BREASTS -  RIGHT:  No palpable mass or nodule.  No nipple discharge.   LEFT:  No palpable mass or nodule.  No nipple discharge.  Well healed scar at 6 o'clock in left breast.  She has a lot of moles. UPPER EXTREMITIES:  No evidence of lymphedema.  DATA REVIEWED: Last mammogram 10/16/2010 - negative.  Ovidio Kin, MD, FACS Office:  [phone]

## 2011-07-28 DIAGNOSIS — R040 Epistaxis: Secondary | ICD-10-CM | POA: Diagnosis not present

## 2011-07-28 DIAGNOSIS — Z961 Presence of intraocular lens: Secondary | ICD-10-CM | POA: Diagnosis not present

## 2011-08-06 ENCOUNTER — Other Ambulatory Visit: Payer: Medicare Other | Admitting: Lab

## 2011-08-13 ENCOUNTER — Other Ambulatory Visit: Payer: Medicare Other | Admitting: Lab

## 2011-08-13 ENCOUNTER — Ambulatory Visit: Payer: Medicare Other | Admitting: Oncology

## 2011-08-24 ENCOUNTER — Other Ambulatory Visit: Payer: Self-pay | Admitting: *Deleted

## 2011-08-24 MED ORDER — SIMVASTATIN 20 MG PO TABS: 20.0000 mg | ORAL_TABLET | Freq: Every day | ORAL | Status: DC

## 2011-09-07 ENCOUNTER — Other Ambulatory Visit: Payer: Self-pay | Admitting: *Deleted

## 2011-09-07 MED ORDER — CITALOPRAM HYDROBROMIDE 10 MG PO TABS: 10.0000 mg | ORAL_TABLET | Freq: Every day | ORAL | Status: DC

## 2011-09-07 NOTE — Telephone Encounter (Signed)
Received faxed refill request from pharmacy for Citalopram. Is it okay to refill medication?

## 2011-09-07 NOTE — Telephone Encounter (Signed)
Will refill electronically  

## 2011-09-09 ENCOUNTER — Telehealth: Payer: Self-pay | Admitting: *Deleted

## 2011-09-09 ENCOUNTER — Other Ambulatory Visit (HOSPITAL_BASED_OUTPATIENT_CLINIC_OR_DEPARTMENT_OTHER): Payer: Medicare Other | Admitting: Lab

## 2011-09-09 ENCOUNTER — Ambulatory Visit (HOSPITAL_BASED_OUTPATIENT_CLINIC_OR_DEPARTMENT_OTHER): Payer: Medicare Other | Admitting: Oncology

## 2011-09-09 VITALS — BP 132/75 | HR 76 | Temp 97.8°F | Ht 63.0 in | Wt 111.4 lb

## 2011-09-09 DIAGNOSIS — Z170 Estrogen receptor positive status [ER+]: Secondary | ICD-10-CM

## 2011-09-09 DIAGNOSIS — E559 Vitamin D deficiency, unspecified: Secondary | ICD-10-CM

## 2011-09-09 DIAGNOSIS — M810 Age-related osteoporosis without current pathological fracture: Secondary | ICD-10-CM

## 2011-09-09 DIAGNOSIS — C50919 Malignant neoplasm of unspecified site of unspecified female breast: Secondary | ICD-10-CM

## 2011-09-09 LAB — CBC WITH DIFFERENTIAL/PLATELET
Basophils Absolute: 0.1 10*3/uL (ref 0.0–0.1)
Eosinophils Absolute: 0.3 10*3/uL (ref 0.0–0.5)
HCT: 37.8 % (ref 34.8–46.6)
HGB: 12.5 g/dL (ref 11.6–15.9)
MONO#: 0.9 10*3/uL (ref 0.1–0.9)
NEUT#: 11.5 10*3/uL — ABNORMAL HIGH (ref 1.5–6.5)
NEUT%: 79 % — ABNORMAL HIGH (ref 38.4–76.8)
RDW: 13.8 % (ref 11.2–14.5)
lymph#: 1.8 10*3/uL (ref 0.9–3.3)

## 2011-09-09 NOTE — Telephone Encounter (Signed)
made patient appointment for mammogram and bone density for 12-03-2011 at 2:00pm printed out calendar and printed out calendar

## 2011-09-09 NOTE — Progress Notes (Signed)
Hematology and Oncology Follow Up Visit  Lauren [name] [id number] 08/22/1927 78 y.o. 09/09/2011 2:46 PM PCP Dr Roxy Manns Principle Diagnosis: T1 B. N0 invasive ductal cancer ER/PR positive, status post lumpectomy 10/25/2007, on Femara  Interim History:  There have been no intercurrent illness, hospitalizations or medication changes. She is doing well. She is recovered from her hospitalization and the sequela as a dog bite in followup. She did have a Doppler ultrasound of any all are orange did not reveal any clots. She is here with her niece we have been discussing genetic testing which has not occurred today.  Medications: I have reviewed the patient's current medications.  Allergies:  Allergies  Allergen Reactions  . Aspirin     REACTION: GI  . Sulfonamide Derivatives     REACTION: itching    Past Medical History, Surgical history, Social history, and Family History were reviewed and updated.  Review of Systems: Constitutional:  Negative for fever, chills, night sweats, anorexia, weight loss, pain. Cardiovascular: no chest pain or dyspnea on exertion Respiratory: no cough, shortness of breath, or wheezing Neurological: negative Dermatological: negative ENT: negative Skin Gastrointestinal: negative Genito-Urinary: negative Hematological and Lymphatic: negative Breast: negative Musculoskeletal: negative Remaining ROS negative.  Physical Exam: Blood pressure 132/75, pulse 76, temperature 97.8 F (36.6 C), height 5\' 3"  (1.6 m), weight 111 lb 6.4 oz (50.531 kg). ECOG:  General appearance: alert, cooperative and appears stated age Head: Normocephalic, without obvious abnormality, atraumatic Neck: no adenopathy, no carotid bruit, no JVD, supple, symmetrical, trachea midline and thyroid not enlarged, symmetric, no tenderness/mass/nodules Lymph nodes: Cervical, supraclavicular, and axillary nodes normal. Cardiac : regular rate and rhythm, no murmurs or  gallops Pulmonary:clear to auscultation bilaterally and normal percussion bilaterally Breasts: inspection negative, no nipple discharge or bleeding, no masses or nodularity palpable Abdomen:soft, non-tender; bowel sounds normal; no masses,  no organomegaly Extremities negative Neuro: alert, oriented, normal speech, no focal findings or movement disorder noted  Lab Results: Lab Results  Component Value Date   WBC 14.5* 09/09/2011   HGB 12.5 09/09/2011   HCT 37.8 09/09/2011   MCV 94.3 09/09/2011   PLT 202 09/09/2011     Chemistry      Component Value Date/Time   NA 138 02/06/2011 1318   NA 138 02/06/2011 1318   NA 138 02/06/2011 1318   K 4.5 02/06/2011 1318   K 4.5 02/06/2011 1318   K 4.5 02/06/2011 1318   CL 99 02/06/2011 1318   CL 99 02/06/2011 1318   CL 99 02/06/2011 1318   CO2 27 02/06/2011 1318   CO2 27 02/06/2011 1318   CO2 27 02/06/2011 1318   BUN 17 02/06/2011 1318   BUN 17 02/06/2011 1318   BUN 17 02/06/2011 1318   CREATININE 0.92 02/06/2011 1318   CREATININE 0.92 02/06/2011 1318   CREATININE 0.92 02/06/2011 1318      Component Value Date/Time   CALCIUM 10.4 02/06/2011 1318   CALCIUM 10.4 02/06/2011 1318   CALCIUM 10.4 02/06/2011 1318   ALKPHOS 63 02/06/2011 1318   ALKPHOS 63 02/06/2011 1318   ALKPHOS 63 02/06/2011 1318   AST 20 02/06/2011 1318   AST 20 02/06/2011 1318   AST 20 02/06/2011 1318   ALT 22 02/06/2011 1318   ALT 22 02/06/2011 1318   ALT 22 02/06/2011 1318   BILITOT 0.3 02/06/2011 1318   BILITOT 0.3 02/06/2011 1318   BILITOT 0.3 02/06/2011 1318      .pathology. Radiological Studies: chest X-ray n/a Mammogram Due 5/12  Bone density Due7./12  Impression and Plan: Patient is doing well. No clinical evidence of recurrence. I've also encouraged her to genetic testing. I will see her in 6 months, with appropriate imaging studies.  More than 50% of the visit was spent in patient-related counselling   Pierce Crane, MD 4/17/20132:46 PM

## 2011-09-10 LAB — COMPREHENSIVE METABOLIC PANEL
Albumin: 3.8 g/dL (ref 3.5–5.2)
Alkaline Phosphatase: 64 U/L (ref 39–117)
BUN: 19 mg/dL (ref 6–23)
CO2: 27 mEq/L (ref 19–32)
Calcium: 9.3 mg/dL (ref 8.4–10.5)
Chloride: 102 mEq/L (ref 96–112)
Glucose, Bld: 152 mg/dL — ABNORMAL HIGH (ref 70–99)
Potassium: 4.4 mEq/L (ref 3.5–5.3)
Sodium: 139 mEq/L (ref 135–145)
Total Protein: 6.8 g/dL (ref 6.0–8.3)

## 2011-09-11 ENCOUNTER — Other Ambulatory Visit: Payer: Self-pay | Admitting: *Deleted

## 2011-09-11 DIAGNOSIS — C50919 Malignant neoplasm of unspecified site of unspecified female breast: Secondary | ICD-10-CM

## 2011-09-11 MED ORDER — LETROZOLE 2.5 MG PO TABS: 2.5000 mg | ORAL_TABLET | Freq: Every day | ORAL | Status: DC

## 2011-09-17 ENCOUNTER — Other Ambulatory Visit (INDEPENDENT_AMBULATORY_CARE_PROVIDER_SITE_OTHER): Payer: Medicare Other

## 2011-09-17 DIAGNOSIS — C50919 Malignant neoplasm of unspecified site of unspecified female breast: Secondary | ICD-10-CM

## 2011-09-17 DIAGNOSIS — E7800 Pure hypercholesterolemia, unspecified: Secondary | ICD-10-CM

## 2011-09-17 DIAGNOSIS — E119 Type 2 diabetes mellitus without complications: Secondary | ICD-10-CM

## 2011-09-17 DIAGNOSIS — E559 Vitamin D deficiency, unspecified: Secondary | ICD-10-CM

## 2011-09-17 LAB — LIPID PANEL
LDL Cholesterol: 55 mg/dL (ref 0–99)
Total CHOL/HDL Ratio: 3
Triglycerides: 137 mg/dL (ref 0.0–149.0)
VLDL: 27.4 mg/dL (ref 0.0–40.0)

## 2011-09-17 LAB — COMPREHENSIVE METABOLIC PANEL
ALT: 13 U/L (ref 0–35)
AST: 19 U/L (ref 0–37)
Alkaline Phosphatase: 66 U/L (ref 39–117)
Calcium: 10 mg/dL (ref 8.4–10.5)
Chloride: 103 mEq/L (ref 96–112)
Creatinine, Ser: 0.6 mg/dL (ref 0.4–1.2)

## 2011-09-22 ENCOUNTER — Encounter: Payer: Self-pay | Admitting: Family Medicine

## 2011-09-22 ENCOUNTER — Ambulatory Visit (INDEPENDENT_AMBULATORY_CARE_PROVIDER_SITE_OTHER): Payer: Medicare Other | Admitting: Family Medicine

## 2011-09-22 VITALS — BP 120/60 | HR 83 | Temp 98.2°F | Ht 63.5 in | Wt 110.5 lb

## 2011-09-22 DIAGNOSIS — L820 Inflamed seborrheic keratosis: Secondary | ICD-10-CM | POA: Diagnosis not present

## 2011-09-22 DIAGNOSIS — E119 Type 2 diabetes mellitus without complications: Secondary | ICD-10-CM | POA: Diagnosis not present

## 2011-09-22 NOTE — Assessment & Plan Note (Signed)
Stable with a1c 6.7  Good diet and up to date on mt issues , and chol in good control No ace due to baseline low bp  Rev low glycemic diet and exercise  F/u 6 mo

## 2011-09-22 NOTE — Assessment & Plan Note (Signed)
4 mm sk on R side of upper back that is inflammed with redness surrounding / abraded  On exam- it fell off in my hand  Area cleaned and dressed with triple abx ointment and band aid inst re: dressing

## 2011-09-22 NOTE — Patient Instructions (Signed)
If you are interested in a shingles/zoster vaccine - call your insurance to check on coverage,( you should not get it within 1 month of other vaccines) , then call us for a prescription  for it to take to a pharmacy that gives the shot    Stay as active as you can- get back to walking if you feel safe   Use antibiotic ointment on spot on back- a scab (keratosis) came off today- and that should get better  If it gets worse let me know  Try some heat on the lower back  Sugar control is stable - that is reassuring  Keep up healthy diet   Follow up in 6 months for annual exam with labs prior

## 2011-09-22 NOTE — Progress Notes (Signed)
Subjective:    Patient ID: Lauren [name], female    DOB: 05/23/1928, 78 y.o.   MRN: [id number]  HPI Here for f/u of DM   Feels fair lately  Stomach bothers her at times Wishes she had more energy   Thinks she has an irritated mole upper back  Also a painful area on back- at waistline    Is getting some help at home now -- and that is good , helps with her sister (who had long term care insurance)  Is plannning a trip to [place] to see a friend if she can    Diabetes Home sugar results - checks usually once daily 80-90s fasting, some afternoons - is good , below 140 usually DM diet - does well  Exercise - trying to get back to walking (did more of that last year) - slacks off in the winter  Symptoms A1C last 6.7- stable from last time  No problems with medications - controlled with metformin and diet  Renal protection- none due to hypotension  Last eye exam     Chemistry      Component Value Date/Time   NA 142 09/17/2011 0839   K 4.2 09/17/2011 0839   CL 103 09/17/2011 0839   CO2 31 09/17/2011 0839   BUN 18 09/17/2011 0839   CREATININE 0.6 09/17/2011 0839      Component Value Date/Time   CALCIUM 10.0 09/17/2011 0839   ALKPHOS 66 09/17/2011 0839   AST 19 09/17/2011 0839   ALT 13 09/17/2011 0839   BILITOT 0.5 09/17/2011 0839        Zoster status - has never had - may be interested in shot   Had recent breast cancer follow up with oncol Dr Donnie Coffin did order a bone density test   bp 120/60 Lab Results  Component Value Date   CHOL 129 09/17/2011   HDL 46.30 09/17/2011   LDLCALC 55 09/17/2011   LDLDIRECT 46.3 10/01/2009   TRIG 137.0 09/17/2011   CHOLHDL 3 09/17/2011   on zocor and good diet   Patient Active Problem List  Diagnoses  . NEOPLASM, MALIGNANT, BREAST. left, T1b, N0, lumpectomy 10/25/2007.  Marland Kitchen DIABETES MELLITUS, TYPE II  . HYPERCHOLESTEROLEMIA, PURE  . MIGRAINE VARIANT  . TIA  . VENOUS INSUFFICIENCY  . DIVERTICULOSIS, COLON  . FIBROCYSTIC BREAST DISEASE  .  DEGENERATIVE JOINT DISEASE  . OSTEOPENIA  . Dysphagia  . Pelvic fracture  . Dog bite  . History of breast cancer, left  . Epistaxis  . Seborrheic keratosis, inflamed   Past Medical History  Diagnosis Date  . Diabetes mellitus   . Gout     injection in the past  . Fibrocystic breast   . Venous insufficiency   . Hyperlipidemia   . TIA (transient ischemic attack) 08/2008    neg MRI/MRA and CT and carotid dopplers  . Chronic constipation   . Gastritis   . Chronic headaches   . Pelvic fracture   . Breast cancer     left breast   Past Surgical History  Procedure Date  . Breast cyst aspiration   . Eye surgery     retinal, then cataract  . Breast lumpectomy 09/2007    with sentinel LN biopsy  . Appendectomy   . Breast lumpectomy     left   History  Substance Use Topics  . Smoking status: Never Smoker   . Smokeless tobacco: Never Used  . Alcohol Use: No   Family History  Problem Relation Age of Onset  . Breast cancer Sister   . Stroke Sister   . Cancer Sister     breast  . Breast cancer Sister   . Stroke Sister   . Cancer Sister     breast  . Stroke Mother   . Stroke Father   . Stroke Brother   . Cancer Brother     prostate  . Stroke Brother   . Stroke Sister    Allergies  Allergen Reactions  . Aspirin     REACTION: GI  . Sulfonamide Derivatives     REACTION: itching   Current Outpatient Prescriptions on File Prior to Visit  Medication Sig Dispense Refill  . acetaminophen (TYLENOL) 500 MG tablet Take 500 mg by mouth every 6 (six) hours as needed.        Marland Kitchen aspirin 81 MG tablet Take 81 mg by mouth daily.        . Calcium-Vitamin D 600-200 MG-UNIT per tablet Take 1 tablet by mouth daily.        . citalopram (CELEXA) 10 MG tablet Take 1 tablet (10 mg total) by mouth daily.  30 tablet  11  . fish oil-omega-3 fatty acids 1000 MG capsule Take 1 g by mouth daily.        Marland Kitchen glucose blood (FREESTYLE TEST STRIPS) test strip 1 each by Other route as needed. Use as  instructed       . letrozole (FEMARA) 2.5 MG tablet Take 1 tablet (2.5 mg total) by mouth daily.  30 tablet  7  . metFORMIN (GLUCOPHAGE) 500 MG tablet Take 1 tablet (500 mg total) by mouth 2 (two) times daily with a meal.  60 tablet  11  . Multiple Vitamins-Minerals (PRESERVISION/LUTEIN) CAPS Take by mouth 2 (two) times daily.        Marland Kitchen omeprazole (PRILOSEC) 40 MG capsule Take 40 mg by mouth daily.        . polyethylene glycol (GLYCOLAX) packet Take 17 g by mouth as needed.        . simvastatin (ZOCOR) 20 MG tablet Take 1 tablet (20 mg total) by mouth at bedtime.  30 tablet  6      Review of Systems Review of Systems  Constitutional: Negative for fever, appetite change, and unexpected weight change. pos for gen fatigue  Eyes: Negative for pain and visual disturbance.  Respiratory: Negative for cough and shortness of breath.   Cardiovascular: Negative for cp or palpitations    Gastrointestinal: Negative for nausea, diarrhea and constipation. pos for indigestion at times  Genitourinary: Negative for urgency and frequency.  Skin: Negative for pallor or rash   MSK pos for aches and pains  Neurological: Negative for weakness, light-headedness, numbness and headaches.  Hematological: Negative for adenopathy. Does not bruise/bleed easily.  Psychiatric/Behavioral: Negative for dysphoric mood. The patient is not nervous/anxious.         Objective:   Physical Exam  Constitutional: She appears well-developed and well-nourished. No distress.  HENT:  Head: Normocephalic and atraumatic.  Mouth/Throat: Oropharynx is clear and moist.  Eyes: Conjunctivae and EOM are normal. Pupils are equal, round, and reactive to light. No scleral icterus.  Neck: Normal range of motion. Neck supple. No JVD present. Carotid bruit is not present. No thyromegaly present.  Cardiovascular: Normal rate, regular rhythm, normal heart sounds and intact distal pulses.  Exam reveals no gallop.   Pulmonary/Chest: Effort normal  and breath sounds normal. No respiratory distress. She has no wheezes.  Abdominal: Soft. Bowel sounds are normal. She exhibits no distension, no abdominal bruit and no mass. There is no tenderness.  Musculoskeletal: She exhibits no edema and no tenderness.  Lymphadenopathy:    She has no cervical adenopathy.  Neurological: She is alert. She has normal reflexes. No cranial nerve deficit. She exhibits normal muscle tone. Coordination normal.  Skin: Skin is warm and dry. No rash noted. There is erythema. No pallor.       2-3 mm pale SK on R mid back with 1 cm of surrounding redness- was gently pulled off / fell off in my hand - area then cleaned and dressed with triple abx and loose dressing    Psychiatric: She has a normal mood and affect.          Assessment & Plan:

## 2011-09-25 ENCOUNTER — Encounter: Payer: Self-pay | Admitting: Family Medicine

## 2011-09-25 ENCOUNTER — Telehealth: Payer: Self-pay | Admitting: Family Medicine

## 2011-09-25 ENCOUNTER — Ambulatory Visit (INDEPENDENT_AMBULATORY_CARE_PROVIDER_SITE_OTHER): Payer: Medicare Other | Admitting: Family Medicine

## 2011-09-25 VITALS — BP 120/70 | HR 87 | Temp 98.4°F | Ht 63.5 in | Wt 110.2 lb

## 2011-09-25 DIAGNOSIS — L0291 Cutaneous abscess, unspecified: Secondary | ICD-10-CM | POA: Diagnosis not present

## 2011-09-25 DIAGNOSIS — L0390 Cellulitis, unspecified: Secondary | ICD-10-CM | POA: Diagnosis not present

## 2011-09-25 NOTE — Telephone Encounter (Signed)
Will see her then 

## 2011-09-25 NOTE — Telephone Encounter (Signed)
Caller: [name]/Patient; PCP: Roxy Manns A.; CB#: [phone];  Call regarding Lauren [name] in the office on 09/22/11 surrounding Area Is Pink; Using Antibiotic Ointment as advised. She has 3 inch by 1 1/2 inch pink area that is warm to touch and slightly swollen. Nontender. Afebrile. Triage and Care advice per Skin Lesions Protocol and appnt scheduled for today 09/25/11 @ 1230.

## 2011-09-27 DIAGNOSIS — L0390 Cellulitis, unspecified: Secondary | ICD-10-CM | POA: Insufficient documentation

## 2011-09-27 NOTE — Progress Notes (Signed)
Subjective:    Patient ID: Lauren [name], female    DOB: [birth year], 78 y.o.   MRN: [id number]  HPI This note was primarily done on paper today due to the loss of computer system   Pt here for red area on back - since removal (loss) of inflammed SK several days ago  Area of redness has become bigger A bit sore No fever or malaise  Is putting abx ointment on it  No drainage  Patient Active Problem List  Diagnoses  . NEOPLASM, MALIGNANT, BREAST. left, T1b, N0, lumpectomy 10/25/2007.  Marland Kitchen DIABETES MELLITUS, TYPE II  . HYPERCHOLESTEROLEMIA, PURE  . MIGRAINE VARIANT  . TIA  . VENOUS INSUFFICIENCY  . DIVERTICULOSIS, COLON  . FIBROCYSTIC BREAST DISEASE  . DEGENERATIVE JOINT DISEASE  . OSTEOPENIA  . Dysphagia  . Pelvic fracture  . Dog bite  . History of breast cancer, left  . Epistaxis  . Seborrheic keratosis, inflamed   Past Medical History  Diagnosis Date  . Diabetes mellitus   . Gout     injection in the past  . Fibrocystic breast   . Venous insufficiency   . Hyperlipidemia   . TIA (transient ischemic attack) 08/2008    neg MRI/MRA and CT and carotid dopplers  . Chronic constipation   . Gastritis   . Chronic headaches   . Pelvic fracture   . Breast cancer     left breast   Past Surgical History  Procedure Date  . Breast cyst aspiration   . Eye surgery     retinal, then cataract  . Breast lumpectomy 09/2007    with sentinel LN biopsy  . Appendectomy   . Breast lumpectomy     left   History  Substance Use Topics  . Smoking status: Never Smoker   . Smokeless tobacco: Never Used  . Alcohol Use: No   Family History  Problem Relation Age of Onset  . Breast cancer Sister   . Stroke Sister   . Cancer Sister     breast  . Breast cancer Sister   . Stroke Sister   . Cancer Sister     breast  . Stroke Mother   . Stroke Father   . Stroke Brother   . Cancer Brother     prostate  . Stroke Brother   . Stroke Sister    Allergies  Allergen Reactions  .  Aspirin     REACTION: GI  . Sulfonamide Derivatives     REACTION: itching   Current Outpatient Prescriptions on File Prior to Visit  Medication Sig Dispense Refill  . acetaminophen (TYLENOL) 500 MG tablet Take 500 mg by mouth every 6 (six) hours as needed.        Marland Kitchen aspirin 81 MG tablet Take 81 mg by mouth daily.        . Calcium-Vitamin D 600-200 MG-UNIT per tablet Take 1 tablet by mouth daily.        . citalopram (CELEXA) 10 MG tablet Take 1 tablet (10 mg total) by mouth daily.  30 tablet  11  . fish oil-omega-3 fatty acids 1000 MG capsule Take 1 g by mouth daily.        Marland Kitchen glucose blood (FREESTYLE TEST STRIPS) test strip 1 each by Other route as needed. Use as instructed       . letrozole (FEMARA) 2.5 MG tablet Take 1 tablet (2.5 mg total) by mouth daily.  30 tablet  7  . metFORMIN (GLUCOPHAGE) 500  MG tablet Take 1 tablet (500 mg total) by mouth 2 (two) times daily with a meal.  60 tablet  11  . omeprazole (PRILOSEC) 40 MG capsule Take 40 mg by mouth daily.        . polyethylene glycol (GLYCOLAX) packet Take 17 g by mouth as needed.        . simvastatin (ZOCOR) 20 MG tablet Take 1 tablet (20 mg total) by mouth at bedtime.  30 tablet  6      Review of Systems Review of Systems  Constitutional: Negative for fever, appetite change, fatigue and unexpected weight change.  Eyes: Negative for pain and visual disturbance.  Respiratory: Negative for cough and shortness of breath.   Cardiovascular: Negative for cp or palpitations    Gastrointestinal: Negative for nausea, diarrhea and constipation.  Genitourinary: Negative for urgency and frequency.  Skin: Negative for pallor or rash  pos for redness Neurological: Negative for weakness, light-headedness, numbness and headaches.  Hematological: Negative for adenopathy. Does not bruise/bleed easily.  Psychiatric/Behavioral: Negative for dysphoric mood. The patient is not nervous/anxious.         Objective:   Physical Exam  Constitutional:  She appears well-developed and well-nourished. No distress.  Eyes: Conjunctivae and EOM are normal. Pupils are equal, round, and reactive to light.  Neck: Normal range of motion. Neck supple. No thyromegaly present.  Cardiovascular: Normal rate and regular rhythm.   Pulmonary/Chest: Effort normal and breath sounds normal.  Lymphadenopathy:    She has no cervical adenopathy.  Skin: Skin is warm and dry. No rash noted. There is erythema. No pallor.       3-4 cm oval area of redness with slt induration R mid back surrounding healed scab from site of former SK No drainage or tenderness          Assessment & Plan:

## 2011-09-27 NOTE — Assessment & Plan Note (Signed)
3-4 cm oval area of infection surrounding scab from  Prev irritated SK Not improved with abx oint Pt put empirically on levaquin 500 qd for 10 d Will update next week Update if not starting to improve in a week or if worsening   Red flags reviewed  No material avail for wound cx

## 2011-09-29 ENCOUNTER — Telehealth: Payer: Self-pay

## 2011-09-29 NOTE — Telephone Encounter (Signed)
Pt update condition pt seen 09/25/11;;redness is going away,slight improvement in 2" hardened area below infected area. No pain,no fever and no drainage noted. Pt still taking antibiotic. Pt [facility] needed and pt can be reached at [phone].

## 2011-09-29 NOTE — Telephone Encounter (Signed)
Patient advised as instructed via telephone. 

## 2011-09-29 NOTE — Telephone Encounter (Signed)
Thanks - sounds like it is improving - so update me when done with abx -or if it gets worse-thanks

## 2011-09-30 ENCOUNTER — Encounter: Payer: Self-pay | Admitting: Family Medicine

## 2011-09-30 ENCOUNTER — Ambulatory Visit (INDEPENDENT_AMBULATORY_CARE_PROVIDER_SITE_OTHER): Payer: Medicare Other | Admitting: Family Medicine

## 2011-09-30 VITALS — BP 112/62 | HR 60 | Temp 97.9°F | Ht 63.5 in | Wt 110.8 lb

## 2011-09-30 DIAGNOSIS — L0390 Cellulitis, unspecified: Secondary | ICD-10-CM

## 2011-09-30 DIAGNOSIS — L0291 Cutaneous abscess, unspecified: Secondary | ICD-10-CM | POA: Diagnosis not present

## 2011-09-30 NOTE — Patient Instructions (Signed)
The spot on your back looks much much better! Finish the antibiotic (levaquin) as planned and update me if any increase in pain/ redness/ swelling or any fever

## 2011-09-30 NOTE — Progress Notes (Signed)
Subjective:    Patient ID: Lauren [name], female    DOB: 03-Nov-1927, 78 y.o.   MRN: [id number]  HPI Here for f/u of area of cellulitis on back where SK came off Is on levaquin 500 daily  Here for re check since is on her back and she cannot see it well   Her caregiver thinks it is more "puffy" No problems with abx Feels ok  No fever   Patient Active Problem List  Diagnoses  . NEOPLASM, MALIGNANT, BREAST. left, T1b, N0, lumpectomy 10/25/2007.  Marland Kitchen DIABETES MELLITUS, TYPE II  . HYPERCHOLESTEROLEMIA, PURE  . MIGRAINE VARIANT  . TIA  . VENOUS INSUFFICIENCY  . DIVERTICULOSIS, COLON  . FIBROCYSTIC BREAST DISEASE  . DEGENERATIVE JOINT DISEASE  . OSTEOPENIA  . Dysphagia  . Pelvic fracture  . Dog bite  . History of breast cancer, left  . Epistaxis  . Seborrheic keratosis, inflamed  . Cellulitis   Past Medical History  Diagnosis Date  . Diabetes mellitus   . Gout     injection in the past  . Fibrocystic breast   . Venous insufficiency   . Hyperlipidemia   . TIA (transient ischemic attack) 08/2008    neg MRI/MRA and CT and carotid dopplers  . Chronic constipation   . Gastritis   . Chronic headaches   . Pelvic fracture   . Breast cancer     left breast   Past Surgical History  Procedure Date  . Breast cyst aspiration   . Eye surgery     retinal, then cataract  . Breast lumpectomy 09/2007    with sentinel LN biopsy  . Appendectomy   . Breast lumpectomy     left   History  Substance Use Topics  . Smoking status: Never Smoker   . Smokeless tobacco: Never Used  . Alcohol Use: No   Family History  Problem Relation Age of Onset  . Breast cancer Sister   . Stroke Sister   . Cancer Sister     breast  . Breast cancer Sister   . Stroke Sister   . Cancer Sister     breast  . Stroke Mother   . Stroke Father   . Stroke Brother   . Cancer Brother     prostate  . Stroke Brother   . Stroke Sister    Allergies  Allergen Reactions  . Aspirin     REACTION: GI    . Sulfonamide Derivatives     REACTION: itching   Current Outpatient Prescriptions on File Prior to Visit  Medication Sig Dispense Refill  . acetaminophen (TYLENOL) 500 MG tablet Take 500 mg by mouth every 6 (six) hours as needed.        Marland Kitchen aspirin 81 MG tablet Take 81 mg by mouth daily.        . Calcium-Vitamin D 600-200 MG-UNIT per tablet Take 1 tablet by mouth daily.        . citalopram (CELEXA) 10 MG tablet Take 1 tablet (10 mg total) by mouth daily.  30 tablet  11  . fish oil-omega-3 fatty acids 1000 MG capsule Take 1 g by mouth daily.        Marland Kitchen glucose blood (FREESTYLE TEST STRIPS) test strip 1 each by Other route as needed. Use as instructed       . letrozole (FEMARA) 2.5 MG tablet Take 1 tablet (2.5 mg total) by mouth daily.  30 tablet  7  . levofloxacin (LEVAQUIN) 500 MG  tablet Take 500 mg by mouth daily.      . metFORMIN (GLUCOPHAGE) 500 MG tablet Take 1 tablet (500 mg total) by mouth 2 (two) times daily with a meal.  60 tablet  11  . omeprazole (PRILOSEC) 40 MG capsule Take 40 mg by mouth daily.        . polyethylene glycol (GLYCOLAX) packet Take 17 g by mouth as needed.        . simvastatin (ZOCOR) 20 MG tablet Take 1 tablet (20 mg total) by mouth at bedtime.  30 tablet  6     Review of Systems Review of Systems  Constitutional: Negative for fever, appetite change, fatigue and unexpected weight change.  Eyes: Negative for pain and visual disturbance.  Respiratory: Negative for cough and shortness of breath.   Cardiovascular: Negative for cp or palpitations    Gastrointestinal: Negative for nausea, diarrhea and constipation.  Genitourinary: Negative for urgency and frequency.  Skin: Negative for pallor or rash   Neurological: Negative for weakness, light-headedness, numbness and headaches.  Hematological: Negative for adenopathy. Does not bruise/bleed easily.  Psychiatric/Behavioral: Negative for dysphoric mood. The patient is not nervous/anxious.         Objective:    Physical Exam  Constitutional: She appears well-developed and well-nourished. No distress.  HENT:  Head: Normocephalic and atraumatic.  Eyes: Conjunctivae and EOM are normal. Pupils are equal, round, and reactive to light.  Neurological: She is alert.  Skin: Skin is warm and dry. No rash noted. No erythema. No pallor.       Prev area of cellulitis is almost gone - redness and swelling resolved, scab is smaller and healing  Many SKs noted   Psychiatric: She has a normal mood and affect.       Mildly anxious  Does ask questions repeatedly          Assessment & Plan:

## 2011-09-30 NOTE — Assessment & Plan Note (Signed)
Area of concern is much much improved - almost not visible or palpable  Will finish the levaquin/ abx oint and warm compresses until done Disc red flags to watch for  F/u prn  Re assured

## 2011-10-06 DIAGNOSIS — R609 Edema, unspecified: Secondary | ICD-10-CM | POA: Diagnosis not present

## 2011-10-06 DIAGNOSIS — M779 Enthesopathy, unspecified: Secondary | ICD-10-CM | POA: Diagnosis not present

## 2011-10-13 DIAGNOSIS — S93409A Sprain of unspecified ligament of unspecified ankle, initial encounter: Secondary | ICD-10-CM | POA: Diagnosis not present

## 2011-11-24 ENCOUNTER — Other Ambulatory Visit: Payer: Self-pay | Admitting: *Deleted

## 2011-11-24 MED ORDER — POLYETHYLENE GLYCOL 3350 17 G PO PACK: 17.0000 g | PACK | ORAL | Status: DC | PRN

## 2011-12-03 ENCOUNTER — Other Ambulatory Visit: Payer: Medicare Other

## 2011-12-16 ENCOUNTER — Encounter (INDEPENDENT_AMBULATORY_CARE_PROVIDER_SITE_OTHER): Payer: Self-pay | Admitting: Surgery

## 2011-12-17 ENCOUNTER — Ambulatory Visit
Admission: RE | Admit: 2011-12-17 | Discharge: 2011-12-17 | Disposition: A | Discharge status: Home / Self Care | Payer: Medicare Other | Source: Ambulatory Visit | Attending: Oncology | Admitting: Oncology

## 2011-12-17 DIAGNOSIS — M899 Disorder of bone, unspecified: Secondary | ICD-10-CM | POA: Diagnosis not present

## 2011-12-17 DIAGNOSIS — Z853 Personal history of malignant neoplasm of breast: Secondary | ICD-10-CM | POA: Diagnosis not present

## 2011-12-17 DIAGNOSIS — C50919 Malignant neoplasm of unspecified site of unspecified female breast: Secondary | ICD-10-CM

## 2011-12-17 DIAGNOSIS — E559 Vitamin D deficiency, unspecified: Secondary | ICD-10-CM

## 2011-12-17 DIAGNOSIS — Z13820 Encounter for screening for osteoporosis: Secondary | ICD-10-CM | POA: Diagnosis not present

## 2011-12-17 DIAGNOSIS — M949 Disorder of cartilage, unspecified: Secondary | ICD-10-CM | POA: Diagnosis not present

## 2011-12-23 ENCOUNTER — Ambulatory Visit (INDEPENDENT_AMBULATORY_CARE_PROVIDER_SITE_OTHER): Payer: Medicare Other | Admitting: Family Medicine

## 2011-12-23 ENCOUNTER — Encounter: Payer: Self-pay | Admitting: Family Medicine

## 2011-12-23 VITALS — BP 130/70 | HR 76 | Temp 97.7°F | Ht 63.5 in | Wt 108.5 lb

## 2011-12-23 DIAGNOSIS — R5383 Other fatigue: Secondary | ICD-10-CM

## 2011-12-23 DIAGNOSIS — R21 Rash and other nonspecific skin eruption: Secondary | ICD-10-CM | POA: Diagnosis not present

## 2011-12-23 DIAGNOSIS — R5381 Other malaise: Secondary | ICD-10-CM | POA: Diagnosis not present

## 2011-12-23 DIAGNOSIS — R040 Epistaxis: Secondary | ICD-10-CM | POA: Diagnosis not present

## 2011-12-23 LAB — CBC WITH DIFFERENTIAL/PLATELET
Basophils Relative: 0.8 % (ref 0.0–3.0)
Eosinophils Absolute: 0.3 10*3/uL (ref 0.0–0.7)
Lymphocytes Relative: 14.8 % (ref 12.0–46.0)
MCHC: 32 g/dL (ref 30.0–36.0)
MCV: 94.5 fl (ref 78.0–100.0)
Monocytes Absolute: 1 10*3/uL (ref 0.1–1.0)
Neutrophils Relative %: 74.1 % (ref 43.0–77.0)
Platelets: 188 10*3/uL (ref 150.0–400.0)
RBC: 4.41 Mil/uL (ref 3.87–5.11)
WBC: 12.6 10*3/uL — ABNORMAL HIGH (ref 4.5–10.5)

## 2011-12-23 LAB — TSH: TSH: 2.68 u[IU]/mL (ref 0.35–5.50)

## 2011-12-23 MED ORDER — METRONIDAZOLE 1 % EX CREA: TOPICAL_CREAM | Freq: Every day | CUTANEOUS | Status: DC

## 2011-12-23 NOTE — Progress Notes (Signed)
Nature conservation officer at [facility] [address] [place] [address] Phone: [phone] Fax: [phone]  Date:  12/23/2011   Name:  Lauren [name]   DOB:  15-Sep-1927   MRN:  [id number]  PCP:  Roxy Manns, MD    Chief Complaint: Fatigue and Epistaxis   History of Present Illness:  Lauren [name] is a 78 y.o. very pleasant female patient who presents with the following:  Last week, had a bad nosebleed. Has had them in the past. Came in Feb with a bad nosebleed and had a cauterization in Feb and March on the left.  Feeling tired and not having some energy. Not getting things done quite as well. Taking care of older sisters right now. 88 and 63 yo.   Several issues, nosebleed as above. She went to sleep with her nose bleeding, and then woke up with her tissue saturated completely with blood. She has some concern. She did have to have a cautery earlier in the year on the left size. No bleeding since then.  She also has some decreased energy but this is been ongoing for many months and years. Over prior lab work has been reviewed.  Results for orders placed in visit on 09/17/11  LIPID PANEL      Component Value Range   Cholesterol 129  0 - 200 mg/dL   Triglycerides 865.7  0.0 - 149.0 mg/dL   HDL 84.69  >62.95 mg/dL   VLDL 28.4  0.0 - 13.2 mg/dL   LDL Cholesterol 55  0 - 99 mg/dL   Total CHOL/HDL Ratio 3    COMPREHENSIVE METABOLIC PANEL      Component Value Range   Sodium 142  135 - 145 mEq/L   Potassium 4.2  3.5 - 5.1 mEq/L   Chloride 103  96 - 112 mEq/L   CO2 31  19 - 32 mEq/L   Glucose, Bld 95  70 - 99 mg/dL   BUN 18  6 - 23 mg/dL   Creatinine, Ser 0.6  0.4 - 1.2 mg/dL   Total Bilirubin 0.5  0.3 - 1.2 mg/dL   Alkaline Phosphatase 66  39 - 117 U/L   AST 19  0 - 37 U/L   ALT 13  0 - 35 U/L   Total Protein 7.5  6.0 - 8.3 g/dL   Albumin 3.9  3.5 - 5.2 g/dL   Calcium 44.0  8.4 - 10.2 mg/dL   GFR 72.53  >66.44 mL/min  HEMOGLOBIN A1C      Component Value Range   Hemoglobin A1C 6.7 (*) 4.6 - 6.5 %   CBC:    Component Value Date/Time   WBC 14.5* 09/09/2011 1347   WBC 10.5 12/18/2010 0946   HGB 12.5 09/09/2011 1347   HGB 13.6 12/18/2010 0946   HCT 37.8 09/09/2011 1347   HCT 41.5 12/18/2010 0946   PLT 202 09/09/2011 1347   PLT 180.0 12/18/2010 0946   MCV 94.3 09/09/2011 1347   MCV 93.8 12/18/2010 0946   NEUTROABS 11.5* 09/09/2011 1347   NEUTROABS 7.2 12/18/2010 0946   LYMPHSABS 1.8 09/09/2011 1347   LYMPHSABS 2.1 12/18/2010 0946   MONOABS 0.9 09/09/2011 1347   MONOABS 0.7 12/18/2010 0946   EOSABS 0.3 09/09/2011 1347   EOSABS 0.3 12/18/2010 0946   BASOSABS 0.1 09/09/2011 1347   BASOSABS 0.1 12/18/2010 0946    Last TSH approx 3.5   She has also been having of flat macular facial rash ongoing for multiple  months. She has tried some Neosporin without any significant relief. She also tried some Vaseline.  Past Medical History, Surgical History, Social History, Family History, Problem List, Medications, and Allergies have been reviewed and updated if relevant.  Current Outpatient Prescriptions on File Prior to Visit  Medication Sig Dispense Refill  . acetaminophen (TYLENOL) 500 MG tablet Take 500 mg by mouth every 6 (six) hours as needed.        Marland Kitchen aspirin 81 MG tablet Take 81 mg by mouth daily.        . Calcium-Vitamin D 600-200 MG-UNIT per tablet Take 1 tablet by mouth daily.        . citalopram (CELEXA) 10 MG tablet Take 1 tablet (10 mg total) by mouth daily.  30 tablet  11  . fish oil-omega-3 fatty acids 1000 MG capsule Take 1 g by mouth daily.        Marland Kitchen glucose blood (FREESTYLE TEST STRIPS) test strip 1 each by Other route as needed. Use as instructed       . letrozole (FEMARA) 2.5 MG tablet Take 1 tablet (2.5 mg total) by mouth daily.  30 tablet  7  . metFORMIN (GLUCOPHAGE) 500 MG tablet Take 1 tablet (500 mg total) by mouth 2 (two) times daily with a meal.  60 tablet  11  . omeprazole (PRILOSEC) 40 MG capsule Take 40 mg by mouth daily.        .  polyethylene glycol (MIRALAX / GLYCOLAX) packet Take 17 g by mouth as needed.  14 each  6  . simvastatin (ZOCOR) 20 MG tablet Take 1 tablet (20 mg total) by mouth at bedtime.  30 tablet  6    Review of Systems:  GEN: No acute illnesses, no fevers, chills. GI: No n/v/d, eating normally Pulm: No SOB Interactive and getting along well at home.  Otherwise, ROS is as per the HPI.   Physical Examination: Filed Vitals:   12/23/11 1209  BP: 130/70  Pulse: 76  Temp: 97.7 F (36.5 C)   Filed Vitals:   12/23/11 1209  Height: 5' 3.5" (1.613 m)  Weight: 108 lb 8 oz (49.215 kg)   Body mass index is 18.92 kg/(m^2). Ideal Body Weight: Weight in (lb) to have BMI = 25: 143.1    GEN: WDWN, NAD, Non-toxic, Alert & Oriented x 3 HEENT: Atraumatic, Normocephalic.  Ears and Nose: No external deformity. No clot. No prominent vasculature. CV: RRR Skin: Flat rash near the adjacent corners of the patient's mouth EXTR: No clubbing/cyanosis/edema NEURO: Normal gait.  PSYCH: Normally interactive. Conversant. Not depressed or anxious appearing.  Calm demeanor.    Assessment and Plan:  1. Fatigue  CBC with Differential, TSH  2. Epistaxis  CBC with Differential, TSH  3. Rash     Check thyroid labs his TSH was abnormal in the past.  Prior epistaxis, recheck CBC.  Treat empirically rash, macular on face with metronidazole cream  Orders Today:  Orders Placed This Encounter  Procedures  . CBC with Differential  . TSH    Medications Today: (Includes new updates added during medication reconciliation) Meds ordered this encounter  Medications  . polyethylene glycol powder (GLYCOLAX/MIRALAX) powder    Sig:   . metronidazole (NORITATE) 1 % cream    Sig: Apply topically daily.    Dispense:  60 g    Refill:  0     Hannah Beat, MD

## 2011-12-24 ENCOUNTER — Encounter: Payer: Self-pay | Admitting: *Deleted

## 2012-01-05 ENCOUNTER — Other Ambulatory Visit: Payer: Self-pay | Admitting: Family Medicine

## 2012-01-24 LAB — HM DIABETES EYE EXAM

## 2012-01-26 ENCOUNTER — Telehealth: Payer: Self-pay

## 2012-01-26 NOTE — Telephone Encounter (Signed)
Pt wants name of shingles vaccine to ck with insurance co. Zostavax was name given.

## 2012-02-05 ENCOUNTER — Ambulatory Visit (INDEPENDENT_AMBULATORY_CARE_PROVIDER_SITE_OTHER): Payer: Medicare Other | Admitting: Family Medicine

## 2012-02-05 ENCOUNTER — Encounter: Payer: Self-pay | Admitting: Family Medicine

## 2012-02-05 VITALS — BP 134/68 | HR 88 | Temp 97.7°F | Ht 63.5 in | Wt 109.2 lb

## 2012-02-05 DIAGNOSIS — J069 Acute upper respiratory infection, unspecified: Secondary | ICD-10-CM

## 2012-02-05 NOTE — Patient Instructions (Addendum)
Drink lots of fluids  Try to get some rest Try nasal saline spray whenever you can  Also plain mucinex for congestion The cough may get worse before it gets better  Update if not starting to improve in a week or if worsening  -- or if sinus pain

## 2012-02-05 NOTE — Progress Notes (Signed)
Subjective:    Patient ID: Lauren [name], female    DOB: [birth year], 78 y.o.   MRN: [id number]  HPI Here with nasal congestion Over the past week - runny nose / stuffy nose - was clear d/c , now yellow  Now congestion is in her chest-dry cough Some nosebleeds on and off- not too bad   Has a dull headache over her eyes  Temp is ok -- a bit above her normal  No sweats or chills  Throat scratchy  Ears feel clogged and full   Has been using cold eze  Patient Active Problem List  Diagnosis  . NEOPLASM, MALIGNANT, BREAST. left, T1b, N0, lumpectomy 10/25/2007.  Marland Kitchen DIABETES MELLITUS, TYPE II  . HYPERCHOLESTEROLEMIA, PURE  . MIGRAINE VARIANT  . TIA  . VENOUS INSUFFICIENCY  . DIVERTICULOSIS, COLON  . FIBROCYSTIC BREAST DISEASE  . DEGENERATIVE JOINT DISEASE  . OSTEOPENIA  . Dysphagia  . Pelvic fracture  . Dog bite  . History of breast cancer, left  . Epistaxis  . Seborrheic keratosis, inflamed  . Cellulitis   Past Medical History  Diagnosis Date  . Diabetes mellitus   . Gout     injection in the past  . Fibrocystic breast   . Venous insufficiency   . Hyperlipidemia   . TIA (transient ischemic attack) 08/2008    neg MRI/MRA and CT and carotid dopplers  . Chronic constipation   . Gastritis   . Chronic headaches   . Pelvic fracture   . Breast cancer     left breast   Past Surgical History  Procedure Date  . Breast cyst aspiration   . Eye surgery     retinal, then cataract  . Breast lumpectomy 09/2007    with sentinel LN biopsy  . Appendectomy   . Breast lumpectomy     left   History  Substance Use Topics  . Smoking status: Never Smoker   . Smokeless tobacco: Never Used  . Alcohol Use: No   Family History  Problem Relation Age of Onset  . Breast cancer Sister   . Stroke Sister   . Cancer Sister     breast  . Breast cancer Sister   . Stroke Sister   . Cancer Sister     breast  . Stroke Mother   . Stroke Father   . Stroke Brother   . Cancer Brother      prostate  . Stroke Brother   . Stroke Sister    Allergies  Allergen Reactions  . Aspirin     REACTION: GI  . Sulfonamide Derivatives     REACTION: itching   Current Outpatient Prescriptions on File Prior to Visit  Medication Sig Dispense Refill  . acetaminophen (TYLENOL) 500 MG tablet Take 500 mg by mouth every 6 (six) hours as needed.        Marland Kitchen aspirin 81 MG tablet Take 81 mg by mouth daily.        . Calcium-Vitamin D 600-200 MG-UNIT per tablet Take 1 tablet by mouth daily.        . citalopram (CELEXA) 10 MG tablet Take 1 tablet (10 mg total) by mouth daily.  30 tablet  11  . fish oil-omega-3 fatty acids 1000 MG capsule Take 1 g by mouth daily.        Marland Kitchen glucose blood (FREESTYLE TEST STRIPS) test strip 1 each by Other route as needed. Use as instructed       . letrozole [facility])  2.5 MG tablet Take 1 tablet (2.5 mg total) by mouth daily.  30 tablet  7  . metFORMIN (GLUCOPHAGE) 500 MG tablet TAKE ONE (1) TABLET BY MOUTH TWO (2)    TIMES DAILY WITH A MEAL  60 tablet  11  . metronidazole (NORITATE) 1 % cream Apply topically daily.  60 g  0  . omeprazole (PRILOSEC) 40 MG capsule Take 40 mg by mouth daily.        . polyethylene glycol (MIRALAX / GLYCOLAX) packet Take 17 g by mouth as needed.  14 each  6  . polyethylene glycol powder (GLYCOLAX/MIRALAX) powder       . simvastatin (ZOCOR) 20 MG tablet Take 1 tablet (20 mg total) by mouth at bedtime.  30 tablet  6      Review of Systems Review of Systems  Constitutional: Negative for fever, appetite change,  and unexpected weight change.  Eyes: Negative for pain and visual disturbance.  ENT pos for cong and rhinorrhea and sinus pressure and neg for ear pain Respiratory: Negative for sob or wheeze  Cardiovascular: Negative for cp or palpitations    Gastrointestinal: Negative for nausea, diarrhea and constipation.  Genitourinary: Negative for urgency and frequency.  Skin: Negative for pallor or rash   Neurological: Negative for  weakness, light-headedness, numbness and headaches.  Hematological: Negative for adenopathy. Does not bruise/bleed easily.  Psychiatric/Behavioral: Negative for dysphoric mood. The patient is not nervous/anxious.         Objective:   Physical Exam  Constitutional: She appears well-developed and well-nourished. No distress.  HENT:  Head: Normocephalic and atraumatic.  Right Ear: External ear normal.  Left Ear: External ear normal.  Mouth/Throat: Oropharynx is clear and moist.       Nares are injected and congested   No facial tenderness   Eyes: Conjunctivae normal and EOM are normal. Pupils are equal, round, and reactive to light. Right eye exhibits no discharge. Left eye exhibits no discharge.  Neck: Normal range of motion. Neck supple. No JVD present. No thyromegaly present.  Cardiovascular: Normal rate, regular rhythm and normal heart sounds.   Pulmonary/Chest: Effort normal and breath sounds normal. No respiratory distress. She has no wheezes. She has no rales. She exhibits no tenderness.  Lymphadenopathy:    She has no cervical adenopathy.  Neurological: She is alert.  Skin: Skin is warm and dry. No rash noted. No erythema. No pallor.  Psychiatric: She has a normal mood and affect.          Assessment & Plan:

## 2012-02-05 NOTE — Assessment & Plan Note (Signed)
No signs of bacterial infection Disc symptomatic care - see instructions on AVS  Update if not starting to improve in a week or if worsening   Disc fluid intake/rest/ nasal saline Will watch for s/s of sinusitis

## 2012-02-22 ENCOUNTER — Encounter (INDEPENDENT_AMBULATORY_CARE_PROVIDER_SITE_OTHER): Payer: Medicare Other | Admitting: Ophthalmology

## 2012-02-22 DIAGNOSIS — E1165 Type 2 diabetes mellitus with hyperglycemia: Secondary | ICD-10-CM | POA: Diagnosis not present

## 2012-02-22 DIAGNOSIS — H43819 Vitreous degeneration, unspecified eye: Secondary | ICD-10-CM

## 2012-02-22 DIAGNOSIS — E1139 Type 2 diabetes mellitus with other diabetic ophthalmic complication: Secondary | ICD-10-CM | POA: Diagnosis not present

## 2012-02-22 DIAGNOSIS — H3530 Unspecified macular degeneration: Secondary | ICD-10-CM

## 2012-02-22 DIAGNOSIS — E11319 Type 2 diabetes mellitus with unspecified diabetic retinopathy without macular edema: Secondary | ICD-10-CM | POA: Diagnosis not present

## 2012-03-10 ENCOUNTER — Ambulatory Visit (INDEPENDENT_AMBULATORY_CARE_PROVIDER_SITE_OTHER): Payer: Medicare Other | Admitting: Surgery

## 2012-03-17 ENCOUNTER — Ambulatory Visit (INDEPENDENT_AMBULATORY_CARE_PROVIDER_SITE_OTHER): Payer: Medicare Other

## 2012-03-17 DIAGNOSIS — Z23 Encounter for immunization: Secondary | ICD-10-CM | POA: Diagnosis not present

## 2012-03-20 ENCOUNTER — Telehealth: Payer: Self-pay | Admitting: Family Medicine

## 2012-03-20 DIAGNOSIS — E7800 Pure hypercholesterolemia, unspecified: Secondary | ICD-10-CM

## 2012-03-20 DIAGNOSIS — M949 Disorder of cartilage, unspecified: Secondary | ICD-10-CM

## 2012-03-20 DIAGNOSIS — E119 Type 2 diabetes mellitus without complications: Secondary | ICD-10-CM

## 2012-03-20 NOTE — Telephone Encounter (Signed)
Message copied by Judy Pimple on Sun Mar 20, 2012  5:45 PM ------      Message from: Alvina Chou      Created: Wed Mar 16, 2012  3:00 PM      Regarding: Lab orders for Monday 10.28.13       Patient is scheduled for CPX labs, please order future labs, Thanks , Camelia Eng

## 2012-03-21 ENCOUNTER — Other Ambulatory Visit (INDEPENDENT_AMBULATORY_CARE_PROVIDER_SITE_OTHER): Payer: Medicare Other

## 2012-03-21 DIAGNOSIS — E119 Type 2 diabetes mellitus without complications: Secondary | ICD-10-CM

## 2012-03-21 DIAGNOSIS — M899 Disorder of bone, unspecified: Secondary | ICD-10-CM

## 2012-03-21 DIAGNOSIS — M949 Disorder of cartilage, unspecified: Secondary | ICD-10-CM

## 2012-03-21 DIAGNOSIS — E7800 Pure hypercholesterolemia, unspecified: Secondary | ICD-10-CM | POA: Diagnosis not present

## 2012-03-21 LAB — CBC WITH DIFFERENTIAL/PLATELET
Basophils Absolute: 0.1 10*3/uL (ref 0.0–0.1)
HCT: 40.4 % (ref 36.0–46.0)
Hemoglobin: 13.2 g/dL (ref 12.0–15.0)
Lymphs Abs: 2.3 10*3/uL (ref 0.7–4.0)
MCHC: 32.7 g/dL (ref 30.0–36.0)
MCV: 94 fl (ref 78.0–100.0)
Monocytes Absolute: 0.7 10*3/uL (ref 0.1–1.0)
Monocytes Relative: 6.2 % (ref 3.0–12.0)
Neutro Abs: 8.2 10*3/uL — ABNORMAL HIGH (ref 1.4–7.7)
Platelets: 214 10*3/uL (ref 150.0–400.0)
RDW: 14.7 % — ABNORMAL HIGH (ref 11.5–14.6)

## 2012-03-21 LAB — COMPREHENSIVE METABOLIC PANEL
ALT: 11 U/L (ref 0–35)
AST: 17 U/L (ref 0–37)
Alkaline Phosphatase: 56 U/L (ref 39–117)
CO2: 30 mEq/L (ref 19–32)
Creatinine, Ser: 0.7 mg/dL (ref 0.4–1.2)
Sodium: 142 mEq/L (ref 135–145)
Total Bilirubin: 0.8 mg/dL (ref 0.3–1.2)
Total Protein: 7.3 g/dL (ref 6.0–8.3)

## 2012-03-21 LAB — HEMOGLOBIN A1C: Hgb A1c MFr Bld: 6.5 % (ref 4.6–6.5)

## 2012-03-21 LAB — LIPID PANEL
Cholesterol: 131 mg/dL (ref 0–200)
VLDL: 22.2 mg/dL (ref 0.0–40.0)

## 2012-03-21 LAB — TSH: TSH: 4.09 u[IU]/mL (ref 0.35–5.50)

## 2012-03-22 ENCOUNTER — Other Ambulatory Visit: Payer: Self-pay | Admitting: Family Medicine

## 2012-03-22 LAB — VITAMIN D 25 HYDROXY (VIT D DEFICIENCY, FRACTURES): Vit D, 25-Hydroxy: 71 ng/mL (ref 30–89)

## 2012-03-28 ENCOUNTER — Ambulatory Visit (INDEPENDENT_AMBULATORY_CARE_PROVIDER_SITE_OTHER): Payer: Medicare Other | Admitting: Family Medicine

## 2012-03-28 ENCOUNTER — Encounter: Payer: Self-pay | Admitting: Family Medicine

## 2012-03-28 VITALS — BP 120/68 | HR 80 | Temp 97.5°F | Ht 62.5 in | Wt 112.0 lb

## 2012-03-28 DIAGNOSIS — M899 Disorder of bone, unspecified: Secondary | ICD-10-CM | POA: Diagnosis not present

## 2012-03-28 DIAGNOSIS — M949 Disorder of cartilage, unspecified: Secondary | ICD-10-CM

## 2012-03-28 DIAGNOSIS — Z853 Personal history of malignant neoplasm of breast: Secondary | ICD-10-CM

## 2012-03-28 DIAGNOSIS — E7800 Pure hypercholesterolemia, unspecified: Secondary | ICD-10-CM | POA: Diagnosis not present

## 2012-03-28 DIAGNOSIS — E119 Type 2 diabetes mellitus without complications: Secondary | ICD-10-CM | POA: Diagnosis not present

## 2012-03-28 NOTE — Assessment & Plan Note (Signed)
Pt is doing well  On femara Sees oncology soon - following bone density

## 2012-03-28 NOTE — Assessment & Plan Note (Signed)
Well controlled with zocor and diet Disc goals for lipids and reasons to control them Rev labs with pt Rev low sat fat diet in detail

## 2012-03-28 NOTE — Patient Instructions (Addendum)
If you are interested in a shingles/zoster vaccine - call your insurance to check on coverage,( you should not get it within 1 month of other vaccines) , then call us for a prescription  for it to take to a pharmacy that gives the shot , or make a nurse visit to get it here depending on your coverage The name of the vaccine we give is called "zostavax" Sugar and cholesterol control are good  Follow up in 6 months with labs prior

## 2012-03-28 NOTE — Progress Notes (Signed)
Subjective:    Patient ID: Lauren [name], female    DOB: [birth year], 78 y.o.   MRN: [id number]  HPI Here for check up of chronic medical conditions and to review health mt list   Feels pretty good overall - wishes she could have more energy  Tries to stay active   Wt is up 1 lb with bmi of 19  Diabetes Home sugar results - stable/fine  DM diet -- is overall very good  Exercise - fair  Symptoms--none  A1C last  Lab Results  Component Value Date   HGBA1C 6.5 03/21/2012   Down from 6.7 No problems with medications  Last eye exam 9/13 was ok   Hyperlipidemia on zocor and diet  Lab Results  Component Value Date   CHOL 131 03/21/2012   CHOL 129 09/17/2011   CHOL 115 12/18/2010   Lab Results  Component Value Date   HDL 44.80 03/21/2012   HDL 46.30 09/17/2011   HDL 40.98 12/18/2010   Lab Results  Component Value Date   LDLCALC 64 03/21/2012   LDLCALC 55 09/17/2011   LDLCALC 49 12/18/2010   Lab Results  Component Value Date   TRIG 111.0 03/21/2012   TRIG 137.0 09/17/2011   TRIG 100.0 12/18/2010   Lab Results  Component Value Date   CHOLHDL 3 03/21/2012   CHOLHDL 3 09/17/2011   CHOLHDL 2 12/18/2010   Lab Results  Component Value Date   LDLDIRECT 46.3 10/01/2009   in very good control    Chemistry      Component Value Date/Time   NA 142 03/21/2012 0845   K 4.8 03/21/2012 0845   CL 105 03/21/2012 0845   CO2 30 03/21/2012 0845   BUN 21 03/21/2012 0845   CREATININE 0.7 03/21/2012 0845      Component Value Date/Time   CALCIUM 9.6 03/21/2012 0845   ALKPHOS 56 03/21/2012 0845   AST 17 03/21/2012 0845   ALT 11 03/21/2012 0845   BILITOT 0.8 03/21/2012 0845       Osteopenia Had dexa same day as mammogram - will disc with Dr Donnie Coffin soon  She is on femara for breast cancer   Colon screen-has not done colonosc in a while -- last one with Dr Claretha Cooper , and had a rough time with it  - does not want it at her age  She tends to stay gassy- that has gone on for a long  time - and she sees Dr Claretha Cooper   Zoster status - has not had the vaccine  She called her insurance co- and could not get info  Mammogram in July  Was fine  Has f/u coming up soon with Dr Donnie Coffin  Patient Active Problem List  Diagnosis  . NEOPLASM, MALIGNANT, BREAST. left, T1b, N0, lumpectomy 10/25/2007.  Marland Kitchen DIABETES MELLITUS, TYPE II  . HYPERCHOLESTEROLEMIA, PURE  . MIGRAINE VARIANT  . TIA  . VENOUS INSUFFICIENCY  . DIVERTICULOSIS, COLON  . FIBROCYSTIC BREAST DISEASE  . DEGENERATIVE JOINT DISEASE  . OSTEOPENIA  . Dysphagia  . Pelvic fracture  . Dog bite  . History of breast cancer, left  . Epistaxis  . Seborrheic keratosis, inflamed  . Cellulitis  . Viral URI   Past Medical History  Diagnosis Date  . Diabetes mellitus   . Gout     injection in the past  . Fibrocystic breast   . Venous insufficiency   . Hyperlipidemia   . TIA (transient ischemic attack) 08/2008    neg  MRI/MRA and CT and carotid dopplers  . Chronic constipation   . Gastritis   . Chronic headaches   . Pelvic fracture   . Breast cancer     left breast   Past Surgical History  Procedure Date  . Breast cyst aspiration   . Eye surgery     retinal, then cataract  . Breast lumpectomy 09/2007    with sentinel LN biopsy  . Appendectomy   . Breast lumpectomy     left   History  Substance Use Topics  . Smoking status: Never Smoker   . Smokeless tobacco: Never Used  . Alcohol Use: No   Family History  Problem Relation Age of Onset  . Breast cancer Sister   . Stroke Sister   . Cancer Sister     breast  . Breast cancer Sister   . Stroke Sister   . Cancer Sister     breast  . Stroke Mother   . Stroke Father   . Stroke Brother   . Cancer Brother     prostate  . Stroke Brother   . Stroke Sister    Allergies  Allergen Reactions  . Aspirin     REACTION: GI  . Sulfonamide Derivatives     REACTION: itching   Current Outpatient Prescriptions on File Prior to Visit  Medication Sig Dispense  Refill  . acetaminophen (TYLENOL) 500 MG tablet Take 500 mg by mouth every 6 (six) hours as needed.        Marland Kitchen aspirin 81 MG tablet Take 81 mg by mouth daily.        . Calcium-Vitamin D 600-200 MG-UNIT per tablet Take 2 tablets by mouth daily.       . citalopram (CELEXA) 10 MG tablet Take 1 tablet (10 mg total) by mouth daily.  30 tablet  11  . fish oil-omega-3 fatty acids 1000 MG capsule Take 1 g by mouth daily.        Marland Kitchen letrozole (FEMARA) 2.5 MG tablet Take 1 tablet (2.5 mg total) by mouth daily.  30 tablet  7  . metFORMIN (GLUCOPHAGE) 500 MG tablet TAKE ONE (1) TABLET BY MOUTH TWO (2)    TIMES DAILY WITH A MEAL  60 tablet  11  . metronidazole (NORITATE) 1 % cream Apply topically daily.  60 g  0  . omeprazole (PRILOSEC) 40 MG capsule Take 40 mg by mouth daily.        . polyethylene glycol (MIRALAX / GLYCOLAX) packet Take 17 g by mouth as needed.  14 each  6  . polyethylene glycol powder (GLYCOLAX/MIRALAX) powder       . simvastatin (ZOCOR) 20 MG tablet TAKE ONE TABLET BY MOUTH EVERY NIGHT    AT BEDTIME  30 tablet  0  . glucose blood (FREESTYLE TEST STRIPS) test strip 1 each by Other route as needed. Use as instructed             Review of Systems Review of Systems  Constitutional: Negative for fever, appetite change, fatigue and unexpected weight change.  Eyes: Negative for pain and visual disturbance.  Respiratory: Negative for cough and shortness of breath.   Cardiovascular: Negative for cp or palpitations    Gastrointestinal: Negative for nausea, diarrhea and constipation.  Genitourinary: Negative for urgency and frequency.  Skin: Negative for pallor or rash   Neurological: Negative for weakness, light-headedness, numbness and headaches.  Hematological: Negative for adenopathy. Does not bruise/bleed easily.  Psychiatric/Behavioral: Negative for dysphoric mood.  The patient is not nervous/anxious.         Objective:   Physical Exam  Constitutional: She appears well-developed and  well-nourished. No distress.  HENT:  Head: Normocephalic and atraumatic.  Right Ear: External ear normal.  Left Ear: External ear normal.  Nose: Nose normal.  Mouth/Throat: Oropharynx is clear and moist.  Eyes: Conjunctivae normal and EOM are normal. Pupils are equal, round, and reactive to light. Right eye exhibits no discharge. Left eye exhibits no discharge. No scleral icterus.  Neck: Normal range of motion. Neck supple. No JVD present. Carotid bruit is not present. No thyromegaly present.  Cardiovascular: Normal rate, regular rhythm, normal heart sounds and intact distal pulses.  Exam reveals no gallop.   Pulmonary/Chest: Effort normal and breath sounds normal. No respiratory distress. She has no wheezes.  Abdominal: Soft. Bowel sounds are normal. She exhibits no distension, no abdominal bruit and no mass. There is no tenderness.  Genitourinary: No breast swelling, tenderness, discharge or bleeding.       Breast exam: No mass, nodules, thickening, tenderness, bulging, retraction, inflamation, nipple discharge or skin changes noted.  No axillary or clavicular LA.  Chaperoned exam.   Baseline scar noted  Musculoskeletal: Normal range of motion. She exhibits no edema and no tenderness.  Lymphadenopathy:    She has no cervical adenopathy.  Neurological: She is alert. She has normal reflexes. No cranial nerve deficit. She exhibits normal muscle tone. Coordination normal.  Skin: Skin is warm and dry. No rash noted. No erythema. No pallor.  Psychiatric: She has a normal mood and affect.          Assessment & Plan:

## 2012-03-28 NOTE — Assessment & Plan Note (Signed)
In very good control with metformin and diet  Foot exam nl  Mild DM retinop-no change at recent exam F/u 6 mo  Will check with ins re: zostavax

## 2012-03-28 NOTE — Assessment & Plan Note (Signed)
This is followed by Dr Donnie Coffin Pt is on femara dexa was this summer - she will disc with him upcoming Compliant with ca and D  D level is 71

## 2012-03-30 ENCOUNTER — Ambulatory Visit (INDEPENDENT_AMBULATORY_CARE_PROVIDER_SITE_OTHER): Payer: Medicare Other | Admitting: Surgery

## 2012-04-14 ENCOUNTER — Ambulatory Visit (HOSPITAL_BASED_OUTPATIENT_CLINIC_OR_DEPARTMENT_OTHER): Payer: Medicare Other | Admitting: Oncology

## 2012-04-14 ENCOUNTER — Telehealth: Payer: Self-pay | Admitting: Oncology

## 2012-04-14 VITALS — BP 137/76 | HR 92 | Temp 98.6°F | Resp 20 | Ht 62.5 in | Wt 110.6 lb

## 2012-04-14 DIAGNOSIS — C50919 Malignant neoplasm of unspecified site of unspecified female breast: Secondary | ICD-10-CM | POA: Diagnosis not present

## 2012-04-14 DIAGNOSIS — Z170 Estrogen receptor positive status [ER+]: Secondary | ICD-10-CM

## 2012-04-14 NOTE — Telephone Encounter (Signed)
gve the pt her may 2014 appt calendar 

## 2012-04-14 NOTE — Progress Notes (Signed)
Hematology and Oncology Follow Up Visit  Lauren [name] [id number] Nov 25, 1927 78 y.o. 04/14/2012 10:52 AM PCP Dr Roxy Manns Principle Diagnosis: T1 B. N0 invasive ductal cancer ER/PR positive, status post lumpectomy 10/25/2007, on Femara  Interim History:  There have been no intercurrent illness, hospitalizations or medication changes. She is doing well.  She has no new complaints, recent mammogram, labs and bone density wnl. Tolerating femara well , she had a recent bone density test which was wnl, there is stable density in her hip. Medications: I have reviewed the patient's current medications.  Allergies:  Allergies  Allergen Reactions  . Aspirin     REACTION: GI  . Sulfonamide Derivatives     REACTION: itching    Past Medical History, Surgical history, Social history, and Family History were reviewed and updated.  Review of Systems: Constitutional:  Negative for fever, chills, night sweats, anorexia, weight loss, pain. Cardiovascular: no chest pain or dyspnea on exertion Respiratory: no cough, shortness of breath, or wheezing Neurological: negative Dermatological: negative ENT: negative Skin Gastrointestinal: negative Genito-Urinary: negative Hematological and Lymphatic: negative Breast: negative Musculoskeletal: negative Remaining ROS negative.  Physical Exam: Frail woman looking stated age. Blood pressure 137/76, pulse 92, temperature 98.6 F (37 C), resp. rate 20, height 5' 2.5" (1.588 m), weight 110 lb 9.6 oz (50.168 kg). ECOG: 1 General appearance: alert, cooperative and appears stated age Head: Normocephalic, without obvious abnormality, atraumatic Neck: no adenopathy, no carotid bruit, no JVD, supple, symmetrical, trachea midline and thyroid not enlarged, symmetric, no tenderness/mass/nodules Lymph nodes: Cervical, supraclavicular, and axillary nodes normal. Cardiac : regular rate and rhythm, no murmurs or gallops Pulmonary:clear to auscultation  bilaterally and normal percussion bilaterally Breasts: inspection negative, no nipple discharge or bleeding, no masses or nodularity palpable, s/p lumpe ctomy. Abdomen:soft, non-tender; bowel sounds normal; no masses,  no organomegaly Extremities negative Neuro: alert, oriented, normal speech, no focal findings or movement disorder noted  Lab Results: Lab Results  Component Value Date   WBC 11.6* 03/21/2012   HGB 13.2 03/21/2012   HCT 40.4 03/21/2012   MCV 94.0 03/21/2012   PLT 214.0 03/21/2012     Chemistry      Component Value Date/Time   NA 142 03/21/2012 0845   K 4.8 03/21/2012 0845   CL 105 03/21/2012 0845   CO2 30 03/21/2012 0845   BUN 21 03/21/2012 0845   CREATININE 0.7 03/21/2012 0845      Component Value Date/Time   CALCIUM 9.6 03/21/2012 0845   ALKPHOS 56 03/21/2012 0845   AST 17 03/21/2012 0845   ALT 11 03/21/2012 0845   BILITOT 0.8 03/21/2012 0845      .pathology. Radiological Studies: chest X-ray n/a Mammogram Due 5/12 Bone density Due7./14  Impression and Plan: Patient is doing well. No clinical evidence of recurrence. I've also encouraged her to genetic testing. I will see her in 6 months, with appropriate imaging studies.  More than 50% of the visit was spent in patient-related counselling   Pierce Crane, MD 11/21/201310:52 AM

## 2012-04-14 NOTE — Progress Notes (Signed)
Hematology and Oncology Follow Up Visit  Lauren [name] [id number] 1927/06/15 78 y.o. 04/14/2012 10:48 AM PCP Dr Roxy Manns Principle Diagnosis: T1 B. N0 invasive ductal cancer ER/PR positive, status post lumpectomy 10/25/2007, on Femara  Interim History:  There have been no intercurrent illness, hospitalizations or medication changes.   Medications: I have reviewed the patient's current medications.  Allergies:  Allergies  Allergen Reactions  . Aspirin     REACTION: GI  . Sulfonamide Derivatives     REACTION: itching    Past Medical History, Surgical history, Social history, and Family History were reviewed and updated.  Review of Systems: Constitutional:  Negative for fever, chills, night sweats, anorexia, weight loss, pain. Cardiovascular: no chest pain or dyspnea on exertion Respiratory: no cough, shortness of breath, or wheezing Neurological: negative Dermatological: negative ENT: negative Skin Gastrointestinal: negative Genito-Urinary: negative Hematological and Lymphatic: negative Breast: negative Musculoskeletal: negative Remaining ROS negative.  Physical Exam: Blood pressure 137/76, pulse 92, temperature 98.6 F (37 C), resp. rate 20, height 5' 2.5" (1.588 m), weight 110 lb 9.6 oz (50.168 kg). ECOG:  General appearance: alert, cooperative and appears stated age Head: Normocephalic, without obvious abnormality, atraumatic Neck: no adenopathy, no carotid bruit, no JVD, supple, symmetrical, trachea midline and thyroid not enlarged, symmetric, no tenderness/mass/nodules Lymph nodes: Cervical, supraclavicular, and axillary nodes normal. Cardiac : regular rate and rhythm, no murmurs or gallops Pulmonary:clear to auscultation bilaterally and normal percussion bilaterally Breasts: inspection negative, no nipple discharge or bleeding, no masses or nodularity palpable Abdomen:soft, non-tender; bowel sounds normal; no masses,  no organomegaly Extremities  negative Neuro: alert, oriented, normal speech, no focal findings or movement disorder noted  Lab Results: Lab Results  Component Value Date   WBC 11.6* 03/21/2012   HGB 13.2 03/21/2012   HCT 40.4 03/21/2012   MCV 94.0 03/21/2012   PLT 214.0 03/21/2012     Chemistry      Component Value Date/Time   NA 142 03/21/2012 0845   K 4.8 03/21/2012 0845   CL 105 03/21/2012 0845   CO2 30 03/21/2012 0845   BUN 21 03/21/2012 0845   CREATININE 0.7 03/21/2012 0845      Component Value Date/Time   CALCIUM 9.6 03/21/2012 0845   ALKPHOS 56 03/21/2012 0845   AST 17 03/21/2012 0845   ALT 11 03/21/2012 0845   BILITOT 0.8 03/21/2012 0845      .pathology. Radiological Studies: chest X-ray n/a Mammogram Due 5/12 Bone density Due7./12  Impression and Plan: Patient is doing well. No clinical evidence of recurrence. I've also encouraged her to genetic testing. I will see her in 6 months, with appropriate imaging studies.  More than 50% of the visit was spent in patient-related counselling   Pierce Crane, MD 11/21/201310:48 AM

## 2012-04-25 ENCOUNTER — Other Ambulatory Visit: Payer: Self-pay | Admitting: Family Medicine

## 2012-05-13 ENCOUNTER — Other Ambulatory Visit: Payer: Self-pay | Admitting: *Deleted

## 2012-05-13 DIAGNOSIS — C50919 Malignant neoplasm of unspecified site of unspecified female breast: Secondary | ICD-10-CM

## 2012-05-13 MED ORDER — LETROZOLE 2.5 MG PO TABS: 2.5000 mg | ORAL_TABLET | Freq: Every day | ORAL | Status: DC

## 2012-05-19 ENCOUNTER — Encounter (INDEPENDENT_AMBULATORY_CARE_PROVIDER_SITE_OTHER): Payer: Self-pay

## 2012-05-26 ENCOUNTER — Other Ambulatory Visit: Payer: Self-pay | Admitting: Dermatology

## 2012-05-26 DIAGNOSIS — D485 Neoplasm of uncertain behavior of skin: Secondary | ICD-10-CM | POA: Diagnosis not present

## 2012-05-26 DIAGNOSIS — L820 Inflamed seborrheic keratosis: Secondary | ICD-10-CM | POA: Diagnosis not present

## 2012-05-26 DIAGNOSIS — D235 Other benign neoplasm of skin of trunk: Secondary | ICD-10-CM | POA: Diagnosis not present

## 2012-05-26 DIAGNOSIS — D1801 Hemangioma of skin and subcutaneous tissue: Secondary | ICD-10-CM | POA: Diagnosis not present

## 2012-05-26 DIAGNOSIS — C4360 Malignant melanoma of unspecified upper limb, including shoulder: Secondary | ICD-10-CM | POA: Diagnosis not present

## 2012-05-26 DIAGNOSIS — L819 Disorder of pigmentation, unspecified: Secondary | ICD-10-CM | POA: Diagnosis not present

## 2012-05-26 DIAGNOSIS — L821 Other seborrheic keratosis: Secondary | ICD-10-CM | POA: Diagnosis not present

## 2012-06-10 ENCOUNTER — Telehealth: Payer: Self-pay | Admitting: *Deleted

## 2012-06-10 MED ORDER — GLUCOSE BLOOD VI STRP: ORAL_STRIP | Status: DC

## 2012-06-10 NOTE — Telephone Encounter (Signed)
done

## 2012-06-10 NOTE — Telephone Encounter (Signed)
She can have #90 with 3 ref inst to check glucose once daily and prn for DM (250.0) thanks

## 2012-06-10 NOTE — Telephone Encounter (Signed)
Received faxed refill request for a new Rx for Prodigy No code Test stripes, ok to fill, and if so how many an what's the directions?

## 2012-06-20 ENCOUNTER — Telehealth: Payer: Self-pay | Admitting: *Deleted

## 2012-06-20 ENCOUNTER — Encounter: Payer: Self-pay | Admitting: Oncology

## 2012-06-20 NOTE — Telephone Encounter (Signed)
Pt's niece called about her appt and I confirmed 10/13/12 appt w/ Bonita Quin.  Mailed letter & calendar to pt.

## 2012-06-21 ENCOUNTER — Other Ambulatory Visit: Payer: Self-pay | Admitting: Dermatology

## 2012-06-21 DIAGNOSIS — C4360 Malignant melanoma of unspecified upper limb, including shoulder: Secondary | ICD-10-CM | POA: Diagnosis not present

## 2012-07-19 ENCOUNTER — Telehealth: Payer: Self-pay | Admitting: Oncology

## 2012-07-19 ENCOUNTER — Encounter: Payer: Self-pay | Admitting: Oncology

## 2012-07-19 ENCOUNTER — Ambulatory Visit (HOSPITAL_BASED_OUTPATIENT_CLINIC_OR_DEPARTMENT_OTHER): Payer: Medicare Other | Admitting: Oncology

## 2012-07-19 ENCOUNTER — Other Ambulatory Visit: Payer: Self-pay | Admitting: *Deleted

## 2012-07-19 VITALS — BP 129/72 | HR 82 | Temp 97.8°F | Resp 20 | Ht 62.5 in | Wt 110.5 lb

## 2012-07-19 DIAGNOSIS — Z170 Estrogen receptor positive status [ER+]: Secondary | ICD-10-CM | POA: Diagnosis not present

## 2012-07-19 DIAGNOSIS — C50919 Malignant neoplasm of unspecified site of unspecified female breast: Secondary | ICD-10-CM | POA: Diagnosis not present

## 2012-07-19 NOTE — Patient Instructions (Addendum)
Mammogram of the left breast  I will see you back in 1 month for follow up.

## 2012-07-19 NOTE — Progress Notes (Signed)
OFFICE PROGRESS NOTE  CC  Roxy Manns, MD [address] [place] [address], [place] [address] Dr. Ovidio Kin Dr. Antony Blackbird  DIAGNOSIS: 79 year old female with history of stage I (T1 B. N0) invasive ductal carcinoma diagnosed in 2009.  PRIOR THERAPY:  #1 patient underwent a lumpectomy of the left breast in June 2009. The final pathology revealed 0.8 cm invasive ductal carcinoma grade 1 with intermediate grade DCIS. 2 sentinel nodes were negative for metastatic disease. Lymphovascular invasion was identified.the tumor was ER +73% PR +93% Ki-67 10% HER-2/neu negative.  #2patient went on to receive radiation therapy by Dr. Antony Blackbird. After completion of this she was begun on Femara 2.5 mg daily she has been tolerating this well.  CURRENT THERAPY:Femara 2.5 mg daily since 2009  INTERVAL HISTORY: Lauren [name] 79 y.o. female returns for and visit. Patient noticed an area of concern in the left breast. She fears that she may have recurrence. I examined the area I do think that she may possibly have scarring. She otherwise denies any other problems.  MEDICAL HISTORY: Past Medical History  Diagnosis Date  . Diabetes mellitus   . Gout     injection in the past  . Fibrocystic breast   . Venous insufficiency   . Hyperlipidemia   . TIA (transient ischemic attack) 08/2008    neg MRI/MRA and CT and carotid dopplers  . Chronic constipation   . Gastritis   . Chronic headaches   . Pelvic fracture   . Breast cancer     left breast    ALLERGIES:  is allergic to aspirin and sulfonamide derivatives.  MEDICATIONS:  Current Outpatient Prescriptions  Medication Sig Dispense Refill  . acetaminophen (TYLENOL) 500 MG tablet Take 500 mg by mouth every 6 (six) hours as needed.        Marland Kitchen aspirin 81 MG tablet Take 81 mg by mouth daily.        . Calcium-Vitamin D 600-200 MG-UNIT per tablet Take 2 tablets by mouth daily.       . citalopram (CELEXA) 10 MG tablet Take 1  tablet (10 mg total) by mouth daily.  30 tablet  11  . fish oil-omega-3 fatty acids 1000 MG capsule Take 1 g by mouth daily.        Marland Kitchen glucose blood (FREESTYLE TEST STRIPS) test strip 1 each by Other route as needed. Use as instructed       . glucose blood (PRODIGY NO CODING BLOOD GLUC) test strip Use as instructed  90 each  3  . letrozole (FEMARA) 2.5 MG tablet Take 1 tablet (2.5 mg total) by mouth daily.  30 tablet  5  . metFORMIN (GLUCOPHAGE) 500 MG tablet TAKE ONE (1) TABLET BY MOUTH TWO (2)    TIMES DAILY WITH A MEAL  60 tablet  11  . NON FORMULARY 1 tablet 2 (two) times daily. preservision (Lutein vit D)      . omeprazole (PRILOSEC) 40 MG capsule Take 40 mg by mouth daily.        . polyethylene glycol (MIRALAX / GLYCOLAX) packet Take 17 g by mouth as needed.  14 each  6  . simvastatin (ZOCOR) 20 MG tablet TAKE ONE (1) TABLET BY MOUTH EVERY NIGHTAT BEDTIME  30 tablet  5  . metronidazole (NORITATE) 1 % cream Apply topically daily.  60 g  0   No current facility-administered medications for this visit.    SURGICAL HISTORY:  Past Surgical History  Procedure Laterality Date  . Breast cyst aspiration    . Eye surgery      retinal, then cataract  . Breast lumpectomy  09/2007    with sentinel LN biopsy  . Appendectomy    . Breast lumpectomy      left    REVIEW OF SYSTEMS:  Pertinent items are noted in HPI.   HEALTH MAINTENANCE:   PHYSICAL EXAMINATION: Blood pressure 129/72, pulse 82, temperature 97.8 F (36.6 C), resp. rate 20, height 5' 2.5" (1.588 m), weight 110 lb 8 oz (50.122 kg). Body mass index is 19.88 kg/(m^2). ECOG PERFORMANCE STATUS: 0 - Asymptomatic   General appearance: alert and cooperative Resp: clear to auscultation bilaterally Cardio: regular rate and rhythm GI: soft, non-tender; bowel sounds normal; no masses,  no organomegaly Extremities: extremities normal, atraumatic, no cyanosis or edema Neurologic: Grossly normal Patient has a well-healed scar in the  left breast there is a mildly palpable area of concern medially. Right breast no masses or nipple discharge.  LABORATORY DATA: Lab Results  Component Value Date   WBC 11.6* 03/21/2012   HGB 13.2 03/21/2012   HCT 40.4 03/21/2012   MCV 94.0 03/21/2012   PLT 214.0 03/21/2012      Chemistry      Component Value Date/Time   NA 142 03/21/2012 0845   K 4.8 03/21/2012 0845   CL 105 03/21/2012 0845   CO2 30 03/21/2012 0845   BUN 21 03/21/2012 0845   CREATININE 0.7 03/21/2012 0845      Component Value Date/Time   CALCIUM 9.6 03/21/2012 0845   ALKPHOS 56 03/21/2012 0845   AST 17 03/21/2012 0845   ALT 11 03/21/2012 0845   BILITOT 0.8 03/21/2012 0845       RADIOGRAPHIC STUDIES:  No results found.  ASSESSMENT: 79 year old female with  #1 stage I invasive ductal carcinoma of the left breast that was ER positive PR positive HER-2/neu negative. She underwent a lumpectomy in 2009 followed by radiation and then begun on Femara 2.5 mg daily she is tolerating it well.  #2 new area of concern in the left breast. I do feel that she is feeling. I have recommended we do imaging studies such as a mammogram and ultrasound if needed. Certainly she will need a biopsy if necessary. Patient understands this workup.   PLAN:   #1 mammogram and ultrasound immediately.  #2 she will see me back in a few weeks time.   All questions were answered. The patient knows to call the clinic with any problems, questions or concerns. We can certainly see the patient much sooner if necessary.  I spent 30 minutes counseling the patient face to face. The total time spent in the appointment was 30 minutes.    Lauren Second, MD Medical/Oncology [facility] [phone] (beeper) [phone] (Office)  07/19/2012, 4:01 PM

## 2012-07-19 NOTE — Telephone Encounter (Signed)
gv pt appt schedule for march. Pt aware she will be contacted w/mammo. Per [facility] dx needs to be changed.

## 2012-07-20 ENCOUNTER — Other Ambulatory Visit: Payer: Self-pay | Admitting: Oncology

## 2012-07-20 ENCOUNTER — Ambulatory Visit: Payer: Medicare Other | Admitting: Oncology

## 2012-07-20 ENCOUNTER — Other Ambulatory Visit: Payer: Self-pay | Admitting: Medical Oncology

## 2012-07-20 DIAGNOSIS — C50919 Malignant neoplasm of unspecified site of unspecified female breast: Secondary | ICD-10-CM

## 2012-07-22 ENCOUNTER — Telehealth: Payer: Self-pay | Admitting: Oncology

## 2012-07-22 NOTE — Telephone Encounter (Signed)
S/w pt today confirming 3/4 mammo/US appt. Pt already has 3/25 /fu.

## 2012-07-26 ENCOUNTER — Ambulatory Visit
Admission: RE | Admit: 2012-07-26 | Discharge: 2012-07-26 | Disposition: A | Discharge status: Home / Self Care | Payer: Medicare Other | Source: Ambulatory Visit | Attending: Oncology | Admitting: Oncology

## 2012-07-26 DIAGNOSIS — C50919 Malignant neoplasm of unspecified site of unspecified female breast: Secondary | ICD-10-CM

## 2012-07-26 DIAGNOSIS — Z853 Personal history of malignant neoplasm of breast: Secondary | ICD-10-CM | POA: Diagnosis not present

## 2012-08-16 ENCOUNTER — Telehealth: Payer: Self-pay | Admitting: Oncology

## 2012-08-16 ENCOUNTER — Encounter: Payer: Self-pay | Admitting: Oncology

## 2012-08-16 ENCOUNTER — Ambulatory Visit (HOSPITAL_BASED_OUTPATIENT_CLINIC_OR_DEPARTMENT_OTHER): Payer: Medicare Other | Admitting: Oncology

## 2012-08-16 VITALS — BP 113/73 | HR 97 | Temp 99.8°F | Resp 20 | Ht 62.5 in | Wt 108.9 lb

## 2012-08-16 DIAGNOSIS — Z170 Estrogen receptor positive status [ER+]: Secondary | ICD-10-CM

## 2012-08-16 DIAGNOSIS — C50919 Malignant neoplasm of unspecified site of unspecified female breast: Secondary | ICD-10-CM

## 2012-08-16 NOTE — Progress Notes (Signed)
OFFICE PROGRESS NOTE  CC  Roxy Manns, MD [address] [place] [address] [place] [address] Dr. Ovidio Kin Dr. Antony Blackbird  DIAGNOSIS: 79 year old female with history of stage I (T1 B. N0) invasive ductal carcinoma diagnosed in 2009.  PRIOR THERAPY:  #1 patient underwent a lumpectomy of the left breast in June 2009. The final pathology revealed 0.8 cm invasive ductal carcinoma grade 1 with intermediate grade DCIS. 2 sentinel nodes were negative for metastatic disease. Lymphovascular invasion was identified.the tumor was ER +73% PR +93% Ki-67 10% HER-2/neu negative.  #2patient went on to receive radiation therapy by Dr. Antony Blackbird. After completion of this she was begun on Femara 2.5 mg daily she has been tolerating this well.  CURRENT THERAPY:Femara 2.5 mg daily since 2009  INTERVAL HISTORY: Lauren [name] 79 y.o. female returns for and visit. She is accompanied by her friend. Patient had her ultrasound and mammogram performed we discussed the results of this today. She otherwise feels well and has no complaints.  MEDICAL HISTORY: Past Medical History  Diagnosis Date  . Diabetes mellitus   . Gout     injection in the past  . Fibrocystic breast   . Venous insufficiency   . Hyperlipidemia   . TIA (transient ischemic attack) 08/2008    neg MRI/MRA and CT and carotid dopplers  . Chronic constipation   . Gastritis   . Chronic headaches   . Pelvic fracture   . Breast cancer     left breast    ALLERGIES:  is allergic to aspirin and sulfonamide derivatives.  MEDICATIONS:  Current Outpatient Prescriptions  Medication Sig Dispense Refill  . acetaminophen (TYLENOL) 500 MG tablet Take 500 mg by mouth every 6 (six) hours as needed.        Marland Kitchen aspirin 81 MG tablet Take 81 mg by mouth daily.        . Calcium-Vitamin D 600-200 MG-UNIT per tablet Take 2 tablets by mouth daily.       . citalopram (CELEXA) 10 MG tablet Take 1 tablet (10 mg total) by mouth daily.   30 tablet  11  . fish oil-omega-3 fatty acids 1000 MG capsule Take 1 g by mouth daily.        Marland Kitchen glucose blood (FREESTYLE TEST STRIPS) test strip 1 each by Other route as needed. Use as instructed       . glucose blood (PRODIGY NO CODING BLOOD GLUC) test strip Use as instructed  90 each  3  . letrozole (FEMARA) 2.5 MG tablet Take 1 tablet (2.5 mg total) by mouth daily.  30 tablet  5  . metFORMIN (GLUCOPHAGE) 500 MG tablet TAKE ONE (1) TABLET BY MOUTH TWO (2)    TIMES DAILY WITH A MEAL  60 tablet  11  . NON FORMULARY 1 tablet 2 (two) times daily. preservision (Lutein vit D)      . omeprazole (PRILOSEC) 40 MG capsule Take 40 mg by mouth daily.        . polyethylene glycol (MIRALAX / GLYCOLAX) packet Take 17 g by mouth as needed.  14 each  6  . simvastatin (ZOCOR) 20 MG tablet TAKE ONE (1) TABLET BY MOUTH EVERY NIGHTAT BEDTIME  30 tablet  5  . metronidazole (NORITATE) 1 % cream Apply topically daily.  60 g  0   No current facility-administered medications for this visit.    SURGICAL HISTORY:  Past Surgical History  Procedure Laterality Date  . Breast cyst  aspiration    . Eye surgery      retinal, then cataract  . Breast lumpectomy  09/2007    with sentinel LN biopsy  . Appendectomy    . Breast lumpectomy      left    REVIEW OF SYSTEMS:  Pertinent items are noted in HPI.   HEALTH MAINTENANCE:   PHYSICAL EXAMINATION: Blood pressure 113/73, pulse 97, temperature 99.8 F (37.7 C), temperature source Oral, resp. rate 20, height 5' 2.5" (1.588 m), weight 108 lb 14.4 oz (49.397 kg). Body mass index is 19.59 kg/(m^2). ECOG PERFORMANCE STATUS: 0 - Asymptomatic   General appearance: alert and cooperative Resp: clear to auscultation bilaterally Cardio: regular rate and rhythm GI: soft, non-tender; bowel sounds normal; no masses,  no organomegaly Extremities: extremities normal, atraumatic, no cyanosis or edema Neurologic: Grossly normal Patient has a well-healed scar in the left breast  there is a mildly palpable area of concern medially. Right breast no masses or nipple discharge.  LABORATORY DATA: Lab Results  Component Value Date   WBC 11.6* 03/21/2012   HGB 13.2 03/21/2012   HCT 40.4 03/21/2012   MCV 94.0 03/21/2012   PLT 214.0 03/21/2012      Chemistry      Component Value Date/Time   NA 142 03/21/2012 0845   K 4.8 03/21/2012 0845   CL 105 03/21/2012 0845   CO2 30 03/21/2012 0845   BUN 21 03/21/2012 0845   CREATININE 0.7 03/21/2012 0845      Component Value Date/Time   CALCIUM 9.6 03/21/2012 0845   ALKPHOS 56 03/21/2012 0845   AST 17 03/21/2012 0845   ALT 11 03/21/2012 0845   BILITOT 0.8 03/21/2012 0845       RADIOGRAPHIC STUDIES:  No results found.  ASSESSMENT: 79 year old female with  #1 stage I invasive ductal carcinoma of the left breast that was ER positive PR positive HER-2/neu negative. She underwent a lumpectomy in 2009 followed by radiation and then begun on Femara 2.5 mg daily she is tolerating it well.  #2 new area of concern in the left breast. I do feel that she is feeling. I have recommended we do imaging studies such as a mammogram and ultrasound if needed. Certainly she will need a biopsy if necessary. Patient understands this workup.  #3 patient had ultrasound and mammogram performed of the left breast that did not reveal any evidence of recurrent disease. Patient is reassured today. She will continue the Femara 2.5 mg daily.   PLAN:  #1 continue Femara daily.  #2 she will be seen back in 3-4 months time for followup.   All questions were answered. The patient knows to call the clinic with any problems, questions or concerns. We can certainly see the patient much sooner if necessary.  I spent 30 minutes counseling the patient face to face. The total time spent in the appointment was 30 minutes.    Lauren Second, MD Medical/Oncology [facility] [phone] (beeper) [phone] (Office)  08/16/2012,  12:59 PM

## 2012-08-16 NOTE — Patient Instructions (Addendum)
Doing well  Mammogram and ultrasound normal  I will see you back in May

## 2012-08-25 DIAGNOSIS — L821 Other seborrheic keratosis: Secondary | ICD-10-CM | POA: Diagnosis not present

## 2012-08-25 DIAGNOSIS — L819 Disorder of pigmentation, unspecified: Secondary | ICD-10-CM | POA: Diagnosis not present

## 2012-08-25 DIAGNOSIS — L919 Hypertrophic disorder of the skin, unspecified: Secondary | ICD-10-CM | POA: Diagnosis not present

## 2012-08-25 DIAGNOSIS — Z85820 Personal history of malignant melanoma of skin: Secondary | ICD-10-CM | POA: Diagnosis not present

## 2012-08-25 DIAGNOSIS — L909 Atrophic disorder of skin, unspecified: Secondary | ICD-10-CM | POA: Diagnosis not present

## 2012-08-25 DIAGNOSIS — D239 Other benign neoplasm of skin, unspecified: Secondary | ICD-10-CM | POA: Diagnosis not present

## 2012-08-25 DIAGNOSIS — D1801 Hemangioma of skin and subcutaneous tissue: Secondary | ICD-10-CM | POA: Diagnosis not present

## 2012-08-25 DIAGNOSIS — L570 Actinic keratosis: Secondary | ICD-10-CM | POA: Diagnosis not present

## 2012-09-19 ENCOUNTER — Telehealth: Payer: Self-pay | Admitting: Family Medicine

## 2012-09-19 DIAGNOSIS — E119 Type 2 diabetes mellitus without complications: Secondary | ICD-10-CM

## 2012-09-19 NOTE — Telephone Encounter (Signed)
Message copied by Judy Pimple on Mon Sep 19, 2012 10:22 PM ------      Message from: Alvina Chou      Created: Tue Sep 13, 2012 11:47 AM      Regarding: Lab orders for Tuesday, 4.29.14       Labs for a 6 month f/u ------

## 2012-09-20 ENCOUNTER — Other Ambulatory Visit (INDEPENDENT_AMBULATORY_CARE_PROVIDER_SITE_OTHER): Payer: Medicare Other

## 2012-09-20 DIAGNOSIS — E119 Type 2 diabetes mellitus without complications: Secondary | ICD-10-CM

## 2012-09-20 LAB — COMPREHENSIVE METABOLIC PANEL
ALT: 10 U/L (ref 0–35)
AST: 17 U/L (ref 0–37)
Albumin: 3.9 g/dL (ref 3.5–5.2)
Alkaline Phosphatase: 57 U/L (ref 39–117)
BUN: 16 mg/dL (ref 6–23)
Creatinine, Ser: 0.7 mg/dL (ref 0.4–1.2)
Potassium: 4.1 mEq/L (ref 3.5–5.1)

## 2012-09-27 ENCOUNTER — Encounter: Payer: Self-pay | Admitting: Family Medicine

## 2012-09-27 ENCOUNTER — Ambulatory Visit (INDEPENDENT_AMBULATORY_CARE_PROVIDER_SITE_OTHER): Payer: Medicare Other | Admitting: Family Medicine

## 2012-09-27 VITALS — BP 122/64 | HR 90 | Temp 98.5°F | Ht 62.5 in | Wt 109.5 lb

## 2012-09-27 DIAGNOSIS — E119 Type 2 diabetes mellitus without complications: Secondary | ICD-10-CM | POA: Diagnosis not present

## 2012-09-27 DIAGNOSIS — Z85820 Personal history of malignant melanoma of skin: Secondary | ICD-10-CM | POA: Insufficient documentation

## 2012-09-27 MED ORDER — CITALOPRAM HYDROBROMIDE 10 MG PO TABS: 10.0000 mg | ORAL_TABLET | Freq: Every day | ORAL | Status: DC

## 2012-09-27 MED ORDER — SIMVASTATIN 20 MG PO TABS: 20.0000 mg | ORAL_TABLET | Freq: Every day | ORAL | Status: DC

## 2012-09-27 NOTE — Progress Notes (Signed)
Subjective:    Patient ID: Lauren [name], female    DOB: [birth year], 79 y.o.   MRN: [id number]  HPI Here for f/u of chronic medical problems   She does ok for the most part - and wishes she had more energy   Her stomach still bothers her - growls all the time and she has pain at times  Takes probiotics  Does not want to go back to GI presently-per pt "they could not find anything wrong" She does not eat spicy foods  Does not think anxiety plays a role   She currently has 2 sisters to take care of and has a helper   Wt is stable with bmi of 19.7  Had hx of skin ca/ melanoma excised in jan- she did very well since then - left arm=-large incision She sees Dr Park Breed for oncology also - watching tender area next to lumpectomy and she imaged that -did not find anything  Diabetes Home sugar results - usually run in the 90s-low 100s fasting  DM diet - eats a healthy diet  Exercise -a little/ walking / wants to do more  Symptoms- none at all  A1C last  Lab Results  Component Value Date   HGBA1C 6.6* 09/20/2012  this is stable - last a1c 6.5  No problems with medications -metformin (no problems, does not think this causes her GI problems)  Renal protection -no ace or arb due to bp that is occ too low  Last eye exam nl in 9/13  Has L eye redness from pollen allergies No vision change   Patient Active Problem List   Diagnosis Date Noted  . Seborrheic keratosis, inflamed 09/22/2011  . Epistaxis 07/01/2011  . History of breast cancer, left 01/08/2011  . Dysphagia 09/17/2010  . Pelvic fracture 09/17/2010  . DEGENERATIVE JOINT DISEASE 02/13/2010  . MIGRAINE VARIANT 11/06/2009  . OSTEOPENIA 10/18/2008  . NEOPLASM, MALIGNANT, BREAST. left, T1b, N0, lumpectomy 10/25/2007. 09/10/2008  . TIA 09/10/2008  . DIABETES MELLITUS, TYPE II 10/06/2007  . VENOUS INSUFFICIENCY 10/06/2007  . DIVERTICULOSIS, COLON 10/06/2007  . FIBROCYSTIC BREAST DISEASE 10/06/2007  . HYPERCHOLESTEROLEMIA, PURE  11/29/2006   Past Medical History  Diagnosis Date  . Diabetes mellitus   . Gout     injection in the past  . Fibrocystic breast   . Venous insufficiency   . Hyperlipidemia   . TIA (transient ischemic attack) 08/2008    neg MRI/MRA and CT and carotid dopplers  . Chronic constipation   . Gastritis   . Chronic headaches   . Pelvic fracture   . Breast cancer     left breast   Past Surgical History  Procedure Laterality Date  . Breast cyst aspiration    . Eye surgery      retinal, then cataract  . Breast lumpectomy  09/2007    with sentinel LN biopsy  . Appendectomy    . Breast lumpectomy      left   History  Substance Use Topics  . Smoking status: Never Smoker   . Smokeless tobacco: Never Used  . Alcohol Use: No   Family History  Problem Relation Age of Onset  . Breast cancer Sister   . Stroke Sister   . Cancer Sister     breast  . Breast cancer Sister   . Stroke Sister   . Cancer Sister     breast  . Stroke Mother   . Stroke Father   . Stroke Brother   .  Cancer Brother     prostate  . Stroke Brother   . Stroke Sister    Allergies  Allergen Reactions  . Aspirin     REACTION: GI  . Sulfonamide Derivatives     REACTION: itching   Current Outpatient Prescriptions on File Prior to Visit  Medication Sig Dispense Refill  . acetaminophen (TYLENOL) 500 MG tablet Take 500 mg by mouth every 6 (six) hours as needed.        Marland Kitchen aspirin 81 MG tablet Take 81 mg by mouth daily.        . Calcium-Vitamin D 600-200 MG-UNIT per tablet Take 2 tablets by mouth daily.       . citalopram (CELEXA) 10 MG tablet Take 1 tablet (10 mg total) by mouth daily.  30 tablet  11  . fish oil-omega-3 fatty acids 1000 MG capsule Take 1 g by mouth daily.        Marland Kitchen glucose blood (FREESTYLE TEST STRIPS) test strip 1 each by Other route as needed. Use as instructed       . glucose blood (PRODIGY NO CODING BLOOD GLUC) test strip Use as instructed  90 each  3  . letrozole (FEMARA) 2.5 MG tablet  Take 1 tablet (2.5 mg total) by mouth daily.  30 tablet  5  . metFORMIN (GLUCOPHAGE) 500 MG tablet TAKE ONE (1) TABLET BY MOUTH TWO (2)    TIMES DAILY WITH A MEAL  60 tablet  11  . metronidazole (NORITATE) 1 % cream Apply topically daily.  60 g  0  . NON FORMULARY 1 tablet 2 (two) times daily. preservision (Lutein vit D)      . omeprazole (PRILOSEC) 40 MG capsule Take 40 mg by mouth daily.        . polyethylene glycol (MIRALAX / GLYCOLAX) packet Take 17 g by mouth as needed.  14 each  6  . simvastatin (ZOCOR) 20 MG tablet TAKE ONE (1) TABLET BY MOUTH EVERY NIGHTAT BEDTIME  30 tablet  5   No current facility-administered medications on file prior to visit.    Review of Systems Review of Systems  Constitutional: Negative for fever, appetite change, fatigue and unexpected weight change.  Eyes: Negative for pain and visual disturbance. pos for redness from allergies  Respiratory: Negative for cough and shortness of breath.   Cardiovascular: Negative for cp or palpitations    Gastrointestinal: Negative for nausea, diarrhea and constipation.  Genitourinary: Negative for urgency and frequency. neg for excessive thirst Skin: Negative for pallor or rash   Neurological: Negative for weakness, light-headedness, numbness and headaches.  Hematological: Negative for adenopathy. Does not bruise/bleed easily.  Psychiatric/Behavioral: Negative for dysphoric mood. The patient is not nervous/anxious.         Objective:   Physical Exam  Constitutional: She appears well-developed and well-nourished. No distress.  HENT:  Head: Normocephalic and atraumatic.  Mouth/Throat: Oropharynx is clear and moist.  Eyes: EOM are normal. Pupils are equal, round, and reactive to light. Right eye exhibits no discharge. Left eye exhibits no discharge. No scleral icterus.  L conj injection   Neck: Normal range of motion. Neck supple. No thyromegaly present.  Cardiovascular: Normal rate, regular rhythm, normal heart sounds  and intact distal pulses.  Exam reveals no gallop.   Pulmonary/Chest: Effort normal and breath sounds normal. No respiratory distress. She has no wheezes.  Musculoskeletal: She exhibits no edema.  Lymphadenopathy:    She has no cervical adenopathy.  Neurological: She is alert. She  has normal reflexes. No cranial nerve deficit. She exhibits normal muscle tone. Coordination normal.  Skin: Skin is warm and dry. No rash noted. No pallor.  Psychiatric: She has a normal mood and affect.          Assessment & Plan:

## 2012-09-27 NOTE — Patient Instructions (Addendum)
Labs look good Keep taking care of yourself  Follow up with me for annual exam in 6 months with labs prior  If you are interested in a shingles/zoster vaccine - call your insurance to check on coverage,( you should not get it within 1 month of other vaccines) , then call us for a prescription  for it to take to a pharmacy that gives the shot , or make a nurse visit to get it here depending on your coverage

## 2012-09-27 NOTE — Assessment & Plan Note (Signed)
Stable a1c at 6.6 with good home sugar results and good diet -working on exercise  opthy utd  Rev good habits Will continue metformin F/u 6 mo for annual exam

## 2012-10-13 ENCOUNTER — Other Ambulatory Visit: Payer: Medicare Other | Admitting: Lab

## 2012-10-13 ENCOUNTER — Ambulatory Visit (HOSPITAL_BASED_OUTPATIENT_CLINIC_OR_DEPARTMENT_OTHER): Payer: Medicare Other | Admitting: Adult Health

## 2012-10-13 ENCOUNTER — Ambulatory Visit: Payer: Medicare Other | Admitting: Oncology

## 2012-10-13 ENCOUNTER — Ambulatory Visit: Payer: Medicare Other | Admitting: Family

## 2012-10-13 ENCOUNTER — Encounter: Payer: Self-pay | Admitting: Adult Health

## 2012-10-13 ENCOUNTER — Other Ambulatory Visit (HOSPITAL_BASED_OUTPATIENT_CLINIC_OR_DEPARTMENT_OTHER): Payer: Medicare Other | Admitting: Lab

## 2012-10-13 ENCOUNTER — Telehealth: Payer: Self-pay | Admitting: *Deleted

## 2012-10-13 VITALS — BP 138/69 | HR 87 | Temp 97.0°F | Resp 18 | Ht 62.5 in | Wt 111.0 lb

## 2012-10-13 DIAGNOSIS — Z170 Estrogen receptor positive status [ER+]: Secondary | ICD-10-CM

## 2012-10-13 DIAGNOSIS — Z9889 Other specified postprocedural states: Secondary | ICD-10-CM

## 2012-10-13 DIAGNOSIS — C50919 Malignant neoplasm of unspecified site of unspecified female breast: Secondary | ICD-10-CM | POA: Diagnosis not present

## 2012-10-13 DIAGNOSIS — Z853 Personal history of malignant neoplasm of breast: Secondary | ICD-10-CM

## 2012-10-13 LAB — CBC WITH DIFFERENTIAL/PLATELET
Eosinophils Absolute: 0.3 10*3/uL (ref 0.0–0.5)
HCT: 38.9 % (ref 34.8–46.6)
LYMPH%: 20 % (ref 14.0–49.7)
MCV: 93.2 fL (ref 79.5–101.0)
MONO#: 1 10*3/uL — ABNORMAL HIGH (ref 0.1–0.9)
MONO%: 8.2 % (ref 0.0–14.0)
NEUT#: 8.3 10*3/uL — ABNORMAL HIGH (ref 1.5–6.5)
NEUT%: 68 % (ref 38.4–76.8)
Platelets: 198 10*3/uL (ref 145–400)
WBC: 12.2 10*3/uL — ABNORMAL HIGH (ref 3.9–10.3)

## 2012-10-13 NOTE — Patient Instructions (Addendum)
Doing well.  You can stop your Letrozole after your last refill.  We will see you back in 1 year.  Please call us if you have any questions or concerns.

## 2012-10-13 NOTE — Progress Notes (Signed)
OFFICE PROGRESS NOTE  CC  Lauren Manns, MD [address] [place] [address] [place] [address] Dr. Ovidio Kin Dr. Antony Blackbird  DIAGNOSIS: 79 year old female with history of stage I (T1 B. N0) invasive ductal carcinoma diagnosed in 2009.  PRIOR THERAPY:  #1 patient underwent a lumpectomy of the left breast in June 2009. The final pathology revealed 0.8 cm invasive ductal carcinoma grade 1 with intermediate grade DCIS. 2 sentinel nodes were negative for metastatic disease. Lymphovascular invasion was identified.the tumor was ER +73% PR +93% Ki-67 10% HER-2/neu negative.  #2patient went on to receive radiation therapy by Dr. Antony Blackbird. After completion of this she was begun on Femara 2.5 mg daily she has been tolerating this well.  CURRENT THERAPY:Femara 2.5 mg daily since June 2009  INTERVAL HISTORY: Lauren [name] 79 y.o. female returns for a follow up visit.  She has almost completed 5 years of anti-estrogen therapy.  She is feeling well.  She did have a mammo and ultrasound of the left breast for a lump that she felt that demonstrated fatty tissue.  Otherwise, she denies hot flashes, fevers, chills, weight loss, pain, or any other concerns.    MEDICAL HISTORY: Past Medical History  Diagnosis Date  . Diabetes mellitus   . Gout     injection in the past  . Fibrocystic breast   . Venous insufficiency   . Hyperlipidemia   . TIA (transient ischemic attack) 08/2008    neg MRI/MRA and CT and carotid dopplers  . Chronic constipation   . Gastritis   . Chronic headaches   . Pelvic fracture   . Breast cancer     left breast    ALLERGIES:  is allergic to aspirin and sulfonamide derivatives.  MEDICATIONS:  Current Outpatient Prescriptions  Medication Sig Dispense Refill  . acetaminophen (TYLENOL) 500 MG tablet Take 500 mg by mouth every 6 (six) hours as needed.        Marland Kitchen aspirin 81 MG tablet Take 81 mg by mouth daily.        . Calcium-Vitamin D 600-200  MG-UNIT per tablet Take 2 tablets by mouth daily.       . citalopram (CELEXA) 10 MG tablet Take 1 tablet (10 mg total) by mouth daily.  30 tablet  11  . fish oil-omega-3 fatty acids 1000 MG capsule Take 1 g by mouth daily.        Marland Kitchen glucose blood (FREESTYLE TEST STRIPS) test strip 1 each by Other route as needed. Use as instructed       . glucose blood (PRODIGY NO CODING BLOOD GLUC) test strip Use as instructed  90 each  3  . letrozole (FEMARA) 2.5 MG tablet Take 1 tablet (2.5 mg total) by mouth daily.  30 tablet  5  . metFORMIN (GLUCOPHAGE) 500 MG tablet TAKE ONE (1) TABLET BY MOUTH TWO (2)    TIMES DAILY WITH A MEAL  60 tablet  11  . metronidazole (NORITATE) 1 % cream Apply topically daily.  60 g  0  . NON FORMULARY 1 tablet 2 (two) times daily. preservision (Lutein vit D)      . omeprazole (PRILOSEC) 40 MG capsule Take 40 mg by mouth daily.        . polyethylene glycol (MIRALAX / GLYCOLAX) packet Take 17 g by mouth as needed.  14 each  6  . Probiotic Product (PROBIOTIC DAILY PO) Take 1 tablet by mouth daily.      Marland Kitchen  simvastatin (ZOCOR) 20 MG tablet Take 1 tablet (20 mg total) by mouth at bedtime.  30 tablet  11   No current facility-administered medications for this visit.    SURGICAL HISTORY:  Past Surgical History  Procedure Laterality Date  . Breast cyst aspiration    . Eye surgery      retinal, then cataract  . Breast lumpectomy  09/2007    with sentinel LN biopsy  . Appendectomy    . Breast lumpectomy      left    REVIEW OF SYSTEMS:  Pertinent items are noted in HPI.   HEALTH MAINTENANCE:   PHYSICAL EXAMINATION: Blood pressure 138/69, pulse 87, temperature 97 F (36.1 C), temperature source Oral, resp. rate 18, height 5' 2.5" (1.588 m), weight 111 lb (50.349 kg). Body mass index is 19.97 kg/(m^2). General: Patient is a well appearing female in no acute distress HEENT: PERRLA, sclerae anicteric no conjunctival pallor, MMM Neck: supple, no palpable adenopathy Lungs: clear  to auscultation bilaterally, no wheezes, rhonchi, or rales Cardiovascular: regular rate rhythm, S1, S2, no murmurs, rubs or gallops Abdomen: Soft, non-tender, non-distended, normoactive bowel sounds, no HSM Extremities: warm and well perfused, no clubbing, cyanosis, or edema Skin: No rashes or lesions Neuro: Non-focal Patient has a well-healed scar no nodularity or masses. Right breast no masses or nipple discharge. ECOG PERFORMANCE STATUS: 0 - Asymptomatic  LABORATORY DATA: Lab Results  Component Value Date   WBC 12.2* 10/13/2012   HGB 13.1 10/13/2012   HCT 38.9 10/13/2012   MCV 93.2 10/13/2012   PLT 198 10/13/2012      Chemistry      Component Value Date/Time   NA 141 09/20/2012 0932   K 4.1 09/20/2012 0932   CL 103 09/20/2012 0932   CO2 31 09/20/2012 0932   BUN 16 09/20/2012 0932   CREATININE 0.7 09/20/2012 0932      Component Value Date/Time   CALCIUM 9.9 09/20/2012 0932   ALKPHOS 57 09/20/2012 0932   AST 17 09/20/2012 0932   ALT 10 09/20/2012 0932   BILITOT 0.6 09/20/2012 0932       RADIOGRAPHIC STUDIES:  No results found.  ASSESSMENT: 79 year old female with  #1 stage I invasive ductal carcinoma of the left breast that was ER positive PR positive HER-2/neu negative. She underwent a lumpectomy in 2009 followed by radiation and then begun on Femara 2.5 mg daily she is tolerating it well.  #2 new area of concern in the left breast.  Patient had ultrasound and mammogram performed of the left breast that did not reveal any evidence of recurrent disease. Patient is reassured today. She will continue the Femara 2.5 mg daily and will stop after her last refill in June 2014.     PLAN:  #1 continue Femara daily.  She will stop this therapy in June, 2014.  I have requested a mammogram in July when she is due.  Last bone density in July 2013.    #2 she will be seen back in 1 year.   All questions were answered. The patient knows to call the clinic with any problems, questions or  concerns. We can certainly see the patient much sooner if necessary.  I spent 25 minutes counseling the patient face to face. The total time spent in the appointment was 30 minutes.  Cherie Ouch Lyn Hollingshead, NP Medical Oncology [facility] Phone: [phone] 10/13/2012, 2:33 PM

## 2012-10-13 NOTE — Telephone Encounter (Signed)
appts made and printed...td 

## 2012-10-14 ENCOUNTER — Telehealth: Payer: Self-pay | Admitting: *Deleted

## 2012-10-14 NOTE — Telephone Encounter (Signed)
sw pt gv  appt d/t for her Mammo scheduled for 12/19/12 @ 10:40am. Pt was not sure if she could attend this appt so i gave her the phone just in case...td

## 2012-10-27 ENCOUNTER — Emergency Department (HOSPITAL_COMMUNITY): Payer: Medicare Other

## 2012-10-27 ENCOUNTER — Observation Stay (HOSPITAL_COMMUNITY)
Admission: EM | Admit: 2012-10-27 | Discharge: 2012-10-28 | Disposition: A | Discharge status: Home / Self Care | Payer: Medicare Other | Attending: Internal Medicine | Admitting: Internal Medicine

## 2012-10-27 ENCOUNTER — Encounter (HOSPITAL_COMMUNITY): Payer: Self-pay | Admitting: Emergency Medicine

## 2012-10-27 DIAGNOSIS — Z79899 Other long term (current) drug therapy: Secondary | ICD-10-CM | POA: Diagnosis not present

## 2012-10-27 DIAGNOSIS — Z7901 Long term (current) use of anticoagulants: Secondary | ICD-10-CM | POA: Diagnosis not present

## 2012-10-27 DIAGNOSIS — I672 Cerebral atherosclerosis: Secondary | ICD-10-CM | POA: Insufficient documentation

## 2012-10-27 DIAGNOSIS — Z8673 Personal history of transient ischemic attack (TIA), and cerebral infarction without residual deficits: Secondary | ICD-10-CM | POA: Diagnosis not present

## 2012-10-27 DIAGNOSIS — J309 Allergic rhinitis, unspecified: Secondary | ICD-10-CM | POA: Diagnosis not present

## 2012-10-27 DIAGNOSIS — J323 Chronic sphenoidal sinusitis: Secondary | ICD-10-CM | POA: Diagnosis not present

## 2012-10-27 DIAGNOSIS — R51 Headache: Secondary | ICD-10-CM | POA: Diagnosis not present

## 2012-10-27 DIAGNOSIS — R4701 Aphasia: Secondary | ICD-10-CM | POA: Diagnosis not present

## 2012-10-27 DIAGNOSIS — E7800 Pure hypercholesterolemia, unspecified: Secondary | ICD-10-CM

## 2012-10-27 DIAGNOSIS — Z66 Do not resuscitate: Secondary | ICD-10-CM | POA: Insufficient documentation

## 2012-10-27 DIAGNOSIS — E119 Type 2 diabetes mellitus without complications: Secondary | ICD-10-CM | POA: Diagnosis not present

## 2012-10-27 DIAGNOSIS — K5730 Diverticulosis of large intestine without perforation or abscess without bleeding: Secondary | ICD-10-CM

## 2012-10-27 DIAGNOSIS — R4789 Other speech disturbances: Secondary | ICD-10-CM | POA: Insufficient documentation

## 2012-10-27 DIAGNOSIS — E1142 Type 2 diabetes mellitus with diabetic polyneuropathy: Secondary | ICD-10-CM | POA: Diagnosis present

## 2012-10-27 DIAGNOSIS — E785 Hyperlipidemia, unspecified: Secondary | ICD-10-CM | POA: Insufficient documentation

## 2012-10-27 DIAGNOSIS — E1169 Type 2 diabetes mellitus with other specified complication: Secondary | ICD-10-CM | POA: Diagnosis present

## 2012-10-27 DIAGNOSIS — I872 Venous insufficiency (chronic) (peripheral): Secondary | ICD-10-CM

## 2012-10-27 DIAGNOSIS — Z853 Personal history of malignant neoplasm of breast: Secondary | ICD-10-CM | POA: Diagnosis not present

## 2012-10-27 DIAGNOSIS — Z85820 Personal history of malignant melanoma of skin: Secondary | ICD-10-CM

## 2012-10-27 DIAGNOSIS — C50919 Malignant neoplasm of unspecified site of unspecified female breast: Secondary | ICD-10-CM

## 2012-10-27 DIAGNOSIS — Z9889 Other specified postprocedural states: Secondary | ICD-10-CM

## 2012-10-27 DIAGNOSIS — M1990 Unspecified osteoarthritis, unspecified site: Secondary | ICD-10-CM

## 2012-10-27 DIAGNOSIS — C50912 Malignant neoplasm of unspecified site of left female breast: Secondary | ICD-10-CM | POA: Diagnosis present

## 2012-10-27 DIAGNOSIS — M899 Disorder of bone, unspecified: Secondary | ICD-10-CM

## 2012-10-27 DIAGNOSIS — G43809 Other migraine, not intractable, without status migrainosus: Secondary | ICD-10-CM

## 2012-10-27 DIAGNOSIS — E162 Hypoglycemia, unspecified: Secondary | ICD-10-CM | POA: Diagnosis present

## 2012-10-27 DIAGNOSIS — I6789 Other cerebrovascular disease: Secondary | ICD-10-CM | POA: Diagnosis not present

## 2012-10-27 DIAGNOSIS — IMO0001 Reserved for inherently not codable concepts without codable children: Secondary | ICD-10-CM | POA: Diagnosis not present

## 2012-10-27 DIAGNOSIS — G459 Transient cerebral ischemic attack, unspecified: Principal | ICD-10-CM

## 2012-10-27 LAB — CREATININE, SERUM
Creatinine, Ser: 1.04 mg/dL (ref 0.50–1.10)
GFR calc Af Amer: 56 mL/min — ABNORMAL LOW (ref >90)
GFR calc non Af Amer: 49 mL/min — ABNORMAL LOW (ref >90)

## 2012-10-27 LAB — URINALYSIS, ROUTINE W REFLEX MICROSCOPIC
Glucose, UA: NEGATIVE mg/dL
Leukocytes, UA: NEGATIVE
pH: 7.5 (ref 5.0–8.0)

## 2012-10-27 LAB — CBC
HCT: 39.2 % (ref 36.0–46.0)
Hemoglobin: 13 g/dL (ref 12.0–15.0)
Hemoglobin: 12.8 g/dL (ref 12.0–15.0)
MCH: 30.7 pg (ref 26.0–34.0)
MCH: 30.6 pg (ref 26.0–34.0)
MCV: 92.5 fL (ref 78.0–100.0)
Platelets: 211 10*3/uL (ref 150–400)
RBC: 4.24 MIL/uL (ref 3.87–5.11)
RBC: 4.18 MIL/uL (ref 3.87–5.11)
RDW: 14.1 % (ref 11.5–15.5)
WBC: 12.9 10*3/uL — ABNORMAL HIGH (ref 4.0–10.5)

## 2012-10-27 LAB — RAPID URINE DRUG SCREEN, HOSP PERFORMED
Amphetamines: NOT DETECTED
Barbiturates: NOT DETECTED
Benzodiazepines: NOT DETECTED
Opiates: NOT DETECTED
Tetrahydrocannabinol: NOT DETECTED

## 2012-10-27 LAB — POCT I-STAT, CHEM 8
BUN: 17 mg/dL (ref 6–23)
Chloride: 104 mEq/L (ref 96–112)
Potassium: 4.2 mEq/L (ref 3.5–5.1)
Sodium: 141 mEq/L (ref 135–145)

## 2012-10-27 LAB — POCT I-STAT TROPONIN I: Troponin i, poc: 0 ng/mL (ref 0.00–0.08)

## 2012-10-27 LAB — COMPREHENSIVE METABOLIC PANEL
Alkaline Phosphatase: 64 U/L (ref 39–117)
BUN: 16 mg/dL (ref 6–23)
GFR calc Af Amer: >90 mL/min (ref >90)
Glucose, Bld: 113 mg/dL — ABNORMAL HIGH (ref 70–99)
Potassium: 4.2 mEq/L (ref 3.5–5.1)
Total Bilirubin: 0.4 mg/dL (ref 0.3–1.2)
Total Protein: 7.3 g/dL (ref 6.0–8.3)

## 2012-10-27 LAB — DIFFERENTIAL
Eosinophils Absolute: 0.2 10*3/uL (ref 0.0–0.7)
Lymphs Abs: 2 10*3/uL (ref 0.7–4.0)
Monocytes Absolute: 0.8 10*3/uL (ref 0.1–1.0)
Monocytes Relative: 7 % (ref 3–12)
Neutrophils Relative %: 72 % (ref 43–77)

## 2012-10-27 LAB — PROTIME-INR
INR: 0.96 (ref 0.00–1.49)
Prothrombin Time: 12.7 seconds (ref 11.6–15.2)

## 2012-10-27 LAB — ETHANOL: Alcohol, Ethyl (B): <11 mg/dL (ref 0–11)

## 2012-10-27 LAB — GLUCOSE, CAPILLARY: Glucose-Capillary: 135 mg/dL — ABNORMAL HIGH (ref 70–99)

## 2012-10-27 LAB — APTT: aPTT: 31 seconds (ref 24–37)

## 2012-10-27 LAB — TROPONIN I: Troponin I: <0.3 ng/mL (ref <0.30)

## 2012-10-27 MED ORDER — SODIUM CHLORIDE 0.9 % IJ SOLN
3.0000 mL | Freq: Two times a day (BID) | INTRAMUSCULAR | Status: DC
  Administered 2012-10-27 – 2012-10-28 (×2): 3 mL via INTRAVENOUS

## 2012-10-27 MED ORDER — PANTOPRAZOLE SODIUM 40 MG PO TBEC
40.0000 mg | DELAYED_RELEASE_TABLET | Freq: Every day | ORAL | Status: DC
  Administered 2012-10-28: 40 mg via ORAL
  Filled 2012-10-27: qty 1

## 2012-10-27 MED ORDER — ENOXAPARIN SODIUM 40 MG/0.4ML ~~LOC~~ SOLN
40.0000 mg | SUBCUTANEOUS | Status: DC
  Administered 2012-10-27: 40 mg via SUBCUTANEOUS
  Filled 2012-10-27 (×2): qty 0.4

## 2012-10-27 MED ORDER — ONDANSETRON HCL 4 MG PO TABS: 4.0000 mg | ORAL_TABLET | Freq: Four times a day (QID) | ORAL | Status: DC | PRN

## 2012-10-27 MED ORDER — CALCIUM CARBONATE-VITAMIN D 500-200 MG-UNIT PO TABS
1.0000 | ORAL_TABLET | Freq: Two times a day (BID) | ORAL | Status: DC
  Administered 2012-10-27 – 2012-10-28 (×2): 1 via ORAL
  Filled 2012-10-27 (×3): qty 1

## 2012-10-27 MED ORDER — ENOXAPARIN SODIUM 40 MG/0.4ML ~~LOC~~ SOLN: 40.0000 mg | SUBCUTANEOUS | Status: DC

## 2012-10-27 MED ORDER — ONDANSETRON HCL 4 MG/2ML IJ SOLN: 4.0000 mg | Freq: Four times a day (QID) | INTRAMUSCULAR | Status: DC | PRN

## 2012-10-27 MED ORDER — SIMVASTATIN 20 MG PO TABS
20.0000 mg | ORAL_TABLET | Freq: Every day | ORAL | Status: DC
Start: 2012-10-27 — End: 2012-10-28
  Administered 2012-10-27: 20 mg via ORAL
  Filled 2012-10-27 (×2): qty 1

## 2012-10-27 MED ORDER — ALUM & MAG HYDROXIDE-SIMETH 200-200-20 MG/5ML PO SUSP: 30.0000 mL | Freq: Four times a day (QID) | ORAL | Status: DC | PRN

## 2012-10-27 MED ORDER — OMEGA-3 FATTY ACIDS 1000 MG PO CAPS: 1.0000 g | ORAL_CAPSULE | Freq: Every day | ORAL | Status: DC

## 2012-10-27 MED ORDER — OMEGA-3-ACID ETHYL ESTERS 1 G PO CAPS
1.0000 g | ORAL_CAPSULE | Freq: Every day | ORAL | Status: DC
  Administered 2012-10-27 – 2012-10-28 (×2): 1 g via ORAL
  Filled 2012-10-27 (×2): qty 1

## 2012-10-27 MED ORDER — ACETAMINOPHEN 500 MG PO TABS
500.0000 mg | ORAL_TABLET | ORAL | Status: DC | PRN
  Administered 2012-10-27: 500 mg via ORAL
  Filled 2012-10-27: qty 1

## 2012-10-27 MED ORDER — SODIUM CHLORIDE 0.9 % IV SOLN: INTRAVENOUS | Status: DC

## 2012-10-27 MED ORDER — CITALOPRAM HYDROBROMIDE 10 MG PO TABS
10.0000 mg | ORAL_TABLET | Freq: Every day | ORAL | Status: DC
Start: 2012-10-27 — End: 2012-10-28
  Administered 2012-10-28: 10 mg via ORAL
  Filled 2012-10-27 (×2): qty 1

## 2012-10-27 MED ORDER — CLOPIDOGREL BISULFATE 75 MG PO TABS
75.0000 mg | ORAL_TABLET | Freq: Every day | ORAL | Status: DC
  Administered 2012-10-28: 75 mg via ORAL
  Filled 2012-10-27 (×2): qty 1

## 2012-10-27 MED ORDER — SENNOSIDES-DOCUSATE SODIUM 8.6-50 MG PO TABS: 1.0000 | ORAL_TABLET | Freq: Every evening | ORAL | Status: DC | PRN

## 2012-10-27 MED ORDER — LETROZOLE 2.5 MG PO TABS
2.5000 mg | ORAL_TABLET | Freq: Every day | ORAL | Status: DC
  Administered 2012-10-28: 2.5 mg via ORAL
  Filled 2012-10-27: qty 1

## 2012-10-27 MED ORDER — POLYETHYLENE GLYCOL 3350 17 G PO PACK
17.0000 g | PACK | Freq: Every day | ORAL | Status: DC
  Administered 2012-10-28: 17 g via ORAL
  Filled 2012-10-27 (×2): qty 1

## 2012-10-27 MED ORDER — CALCIUM-VITAMIN D 600-200 MG-UNIT PO TABS: 1.0000 | ORAL_TABLET | Freq: Two times a day (BID) | ORAL | Status: DC

## 2012-10-27 MED ORDER — ONDANSETRON HCL 4 MG/2ML IJ SOLN: 4.0000 mg | Freq: Three times a day (TID) | INTRAMUSCULAR | Status: AC | PRN

## 2012-10-27 NOTE — H&P (Signed)
[facility] History and Physical  MALEIGHA COLVARD [name]:[id number] DOB: May 11, 1928 DOA: 10/27/2012  Referring physician: Dr. 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  She was last seen normal at 9 PM June 4. This morning she slept later than usual awakening at 8 AM. She was dizzy, confused, and unable to speak. She conveyed to her family that she had a headache.  In the emergency department she was seen by neurology. He did not administer TPA do to uncertain time of onset of symptoms. [facility] was called to admit the patient for TIA workup. The history was gathered from her niece and power of attorney.  The patient lives at home with her 2 demented sisters they are cared for round-the-clock by CNAs and family members.  The patient normally ambulates on her own without assistance. Her niece reports an episode of significant hypoglycemia earlier this week. The patient's main complaint is a lack of energy, and the feeling of exhaustion.  In the emergency department the patient's symptoms resolved. She was found to have an abnormality in her MCA on CT scan. MRI has been ordered for further evaluation   Review of Systems: Niece, POA, endorses:  Exhaustion, Progressive weakening over past two years, eats very little, No recent illness, Occasional HA / Migraine, no changes in bowel habits, SOB, CP, Dysuria, or bleeding.  All other systems were review and found to be negative.  Past Medical History  Diagnosis Date  . Diabetes mellitus   . Gout     injection in the past  . Fibrocystic breast   . Venous insufficiency   . Hyperlipidemia   . TIA (transient ischemic attack) 08/2008    neg MRI/MRA and CT and  carotid dopplers  . Chronic constipation   . Gastritis   . Chronic headaches   . Pelvic fracture   . Breast cancer     left breast   Past Surgical History  Procedure Laterality Date  . Breast cyst aspiration    . Eye surgery      retinal, then cataract  . Breast lumpectomy  09/2007    with sentinel LN biopsy  . Appendectomy    . Breast lumpectomy      left   Social History:  reports that she has never smoked. She has never used smokeless tobacco. She reports that she does not drink alcohol or use illicit drugs. The patient has never used tobacco or consume alcoholic overdose. She lives in her and home with her sisters and has 24-hour care  Allergies  Allergen Reactions  . Aspirin     REACTION: GI  . Sulfonamide Derivatives     REACTION: itching    Family History  Problem Relation Age of Onset  . Breast cancer Sister   . Stroke Sister   . Cancer Sister     breast  . Breast cancer Sister   . Stroke Sister   . Cancer Sister     breast  . Stroke Mother   . Stroke Father   . Stroke Brother   . Cancer Brother     prostate  . Stroke Brother   . Stroke Sister     Prior to Admission medications   Medication Sig Start Date End  Date Taking? Authorizing Provider  acetaminophen (TYLENOL) 500 MG tablet Take 500 mg by mouth every 6 (six) hours as needed.     Yes Historical Provider, MD  aspirin 81 MG tablet Take 81 mg by mouth daily.     Yes Historical Provider, MD  Calcium-Vitamin D 600-200 MG-UNIT per tablet Take 1 tablet by mouth 2 (two) times daily.    Yes Historical Provider, MD  citalopram (CELEXA) 10 MG tablet Take 1 tablet (10 mg total) by mouth daily. 09/27/12  Yes Judy Pimple, MD  fish oil-omega-3 fatty acids 1000 MG capsule Take 1 g by mouth daily.     Yes Historical Provider, MD  glucose blood (FREESTYLE TEST STRIPS) test strip 1 each by Other route as needed. Use as instructed    Yes Historical Provider, MD  glucose blood (PRODIGY NO CODING BLOOD GLUC) test  strip Use as instructed 06/10/12  Yes Judy Pimple, MD  letrozole [facility]) 2.5 MG tablet Take 1 tablet (2.5 mg total) by mouth daily. 05/13/12  Yes Pierce Crane, MD  metFORMIN (GLUCOPHAGE) 500 MG tablet Take 500 mg by mouth 2 (two) times daily with a meal.   Yes Historical Provider, MD  NON FORMULARY 1 tablet 2 (two) times daily. preservision (Lutein vit D)   Yes Historical Provider, MD  omeprazole (PRILOSEC) 40 MG capsule Take 40 mg by mouth daily.     Yes Historical Provider, MD  polyethylene glycol (MIRALAX / GLYCOLAX) packet Take 17 g by mouth as needed. 11/24/11  Yes Judy Pimple, MD  Probiotic Product (PROBIOTIC DAILY PO) Take 1 tablet by mouth daily.   Yes Historical Provider, MD  simvastatin (ZOCOR) 20 MG tablet Take 1 tablet (20 mg total) by mouth at bedtime. 09/27/12  Yes Judy Pimple, MD   Physical Exam: Filed Vitals:   10/27/12 1315 10/27/12 1330 10/27/12 1345 10/27/12 1400  BP: 149/71 149/71 142/63 143/68  Pulse: 66 66 66 71  Temp:      TempSrc:      Resp: 21 20 22 23   Height:      Weight:      SpO2: 100% 100% 100% 99%   Gen.: 79 year old female Awake and alert with clear speech HEENT: Pupils are equal and round, sclera clear conjunctiva pink, mucous membranes moist Neck: supple without lymphadenopathy Chest: Clear consultation without wheezes crackles arousals Cardiovascular: Regular rate and rhythm without murmurs rubs or gallops Abdomen: Soft, nontender, nondistended, positive bowel sounds, no masses Extremities: 5 over 5 strength in each and able to move all 4 Neuro: Cranial nerves II through XII are grossly intact, nonfocal     Labs on Admission:  Basic Metabolic Panel:  Recent Labs Lab 10/27/12 1228 10/27/12 1230  NA 139 141  K 4.2 4.2  CL 102 104  CO2 28  --   GLUCOSE 113* 116*  BUN 16 17  CREATININE 0.61 0.70  CALCIUM 10.1  --    Liver Function Tests:  Recent Labs Lab 10/27/12 1228  AST 15  ALT 9  ALKPHOS 64  BILITOT 0.4  PROT 7.3  ALBUMIN  3.6   CBC:  Recent Labs Lab 10/27/12 1228 10/27/12 1230  WBC 10.8*  --   NEUTROABS 7.7  --   HGB 13.0 13.6  HCT 39.2 40.0  MCV 92.5  --   PLT 192  --    Cardiac Enzymes:  Recent Labs Lab 10/27/12 1229  TROPONINI <0.30   CBG:  Recent Labs Lab 10/27/12 1244  GLUCAP  88    Radiological Exams on Admission: Ct Head Wo Contrast  10/27/2012   *RADIOLOGY REPORT*  Clinical Data: 79 year old female Code stroke.  Slurred speech. Headache.  CT HEAD WITHOUT CONTRAST  Technique:  Contiguous axial images were obtained from the base of the skull through the vertex without contrast.  Comparison: 08/07/2010 and earlier.  Findings: Minor bubbly opacity right sphenoid sinus.  Other Visualized paranasal sinuses and mastoids are clear.  No acute osseous abnormality identified.  No acute orbit or scalp soft tissue findings.  Mild Calcified atherosclerosis at the skull base.  Questionable abnormal hyperdensity associated with a left MCA proximal M2 branch on series 2 image 10.  No acute loss of gray-white differentiation identified in the left hemisphere to suggest acute infarct.  Patchy periventricular white matter hypodensity has mildly progressed. No midline shift, mass effect, or evidence of mass lesion.  No acute intracranial hemorrhage identified.  No ventriculomegaly.  IMPRESSION: 1.  Questionable atherosclerotic plaque or thrombus of a left MCA M2 branch on series 2 image 10. 2.  No associated parenchymal CT findings of acute infarct identified. 3.  Mild progression of chronic white matter changes.  Study discussed by telephone with Dr. Leroy Kennedy on 10/27/2012 at 1236 hours.   Original Report Authenticated By: Erskine Speed, M.D.   Mr [facility] Wo Contrast  10/27/2012   *RADIOLOGY REPORT*  Clinical Data:  Slurred speech which is improving.  Headache. History breast cancer.  MRI BRAIN WITHOUT CONTRAST MRA HEAD WITHOUT CONTRAST  Technique: Multiplanar, multiecho pulse sequences of the brain and surrounding  structures were obtained according to standard protocol without intravenous contrast.  Angiographic images of the head were obtained using MRA technique without contrast.  Comparison: 10/27/2012 head CT.  10/25/2009 brain MRI and MRA angiogram.  MRI HEAD  Findings:  No acute infarct.  T2 shine through on diffusion sequence.  Remote small right frontal lobe infarct.  Mild to moderate small vessel disease type changes.  No intracranial hemorrhage.  Global atrophy without hydrocephalus.  No intracranial mass lesion detected on this unenhanced exam.   Cervical medullary junction, pituitary region and pineal region as well as orbital structures unremarkable.  Minimal mucosal thickening paranasal sinuses.  IMPRESSION: No acute infarct.  Please see above.  MRA HEAD  Findings: Ectatic distal vertical cervical segment of the right internal carotid artery.  Moderate stenosis cavernous segment left internal carotid artery.  Mild bulge right internal carotid artery cavernous segment felt to be related to atherosclerotic type changes rather than aneurysm.  Slightly bulbous appearance of the supraclinoid aspect of the internal carotid artery bilaterally without discrete aneurysm.  Hypoplastic A1 segment of the left anterior cerebral artery.  Mild narrowing irregularity of the M1 segment of the left middle cerebral artery.  Mild middle cerebral artery branch vessel irregularity greater on the left.  Ectatic vertebral arteries and basilar artery.  Right vertebral artery is dominant.  Slight irregularity of large left PICA.  Nonvisualization right PICA.  Minimal narrowing mid aspect of the basilar artery.  Nonvisualization left AICA.  Irregular narrowed right AICA.  Mild to moderate narrowing involving portions of the posterior cerebral artery bilaterally more notable on the right.  IMPRESSION: Intracranial atherosclerotic type changes as detailed above.   Original Report Authenticated By: Lacy Duverney, M.D.   Mr Brain Wo  Contrast  10/27/2012   *RADIOLOGY REPORT*  Clinical Data:  Slurred speech which is improving.  Headache. History breast cancer.  MRI BRAIN WITHOUT CONTRAST MRA HEAD WITHOUT CONTRAST  Technique: Multiplanar, multiecho pulse sequences of the brain and surrounding structures were obtained according to standard protocol without intravenous contrast.  Angiographic images of the head were obtained using MRA technique without contrast.  Comparison: 10/27/2012 head CT.  10/25/2009 brain MRI and MRA angiogram.  MRI HEAD  Findings:  No acute infarct.  T2 shine through on diffusion sequence.  Remote small right frontal lobe infarct.  Mild to moderate small vessel disease type changes.  No intracranial hemorrhage.  Global atrophy without hydrocephalus.  No intracranial mass lesion detected on this unenhanced exam.   Cervical medullary junction, pituitary region and pineal region as well as orbital structures unremarkable.  Minimal mucosal thickening paranasal sinuses.  IMPRESSION: No acute infarct.  Please see above.  MRA HEAD  Findings: Ectatic distal vertical cervical segment of the right internal carotid artery.  Moderate stenosis cavernous segment left internal carotid artery.  Mild bulge right internal carotid artery cavernous segment felt to be related to atherosclerotic type changes rather than aneurysm.  Slightly bulbous appearance of the supraclinoid aspect of the internal carotid artery bilaterally without discrete aneurysm.  Hypoplastic A1 segment of the left anterior cerebral artery.  Mild narrowing irregularity of the M1 segment of the left middle cerebral artery.  Mild middle cerebral artery branch vessel irregularity greater on the left.  Ectatic vertebral arteries and basilar artery.  Right vertebral artery is dominant.  Slight irregularity of large left PICA.  Nonvisualization right PICA.  Minimal narrowing mid aspect of the basilar artery.  Nonvisualization left AICA.  Irregular narrowed right AICA.  Mild to  moderate narrowing involving portions of the posterior cerebral artery bilaterally more notable on the right.  IMPRESSION: Intracranial atherosclerotic type changes as detailed above.   Original Report Authenticated By: Lacy Duverney, M.D.    EKG: Independently reviewed. Normal sinus rhythm, with PACs  Assessment/Plan Active Problems:   NEOPLASM, MALIGNANT, BREAST. left, T1b, N0, lumpectomy 10/25/2007.   DIABETES MELLITUS, TYPE II   MIGRAINE VARIANT   TIA   History of breast cancer, left   Hypoglycemia  Aphasia TIA v stroke v hypoglycemic episode Symptoms have resolved Patient admitted for observation. Undergoing stroke workup. Chest x-ray not done in the ED, evaluate patient for clinical symptoms of infection Patient is already on simvastatin and an 81 mg aspirin daily  Diabetes mellitus Will hold metformin Will not start insulin at this point in time, but rather check every 4 hour CBGs Hemoglobin A1c pending  Breast cancer Status post left lumpectomy Continue Femara  Chronic constipation Continue prior to admission stool softeners.   Neurology consulted:  Seen by Dr. Georga Hacking  Code Status: DNR Family Communication: Merrily Brittle, [place], [phone] Disposition Plan: PT consult pending, but likely to home 10/28/12  Time spent: [address] [facility] Pager [phone]  If 7PM-7AM, please contact night-coverage [email or web address] Password [facility] 10/27/2012, 3:37 PM  Attending Patient seen and examined, agree with the assessment and plan. Admitted with difficulty speaking, symptoms resolved within an hour-?TIA. Will admit for further work up, already on ASA 81 mg, therefore will switch to Plavix.   S Keora Eccleston

## 2012-10-27 NOTE — Progress Notes (Signed)
  Echocardiogram 2D Echocardiogram has been performed.  Lauren [name] 10/27/2012, 4:28 PM

## 2012-10-27 NOTE — ED Notes (Signed)
Pt given meal tray.

## 2012-10-27 NOTE — Consult Note (Addendum)
Referring Physician: ED. Code stroke    Chief Complaint: language impairment, " foggy head', HA  HPI:                                                                                                                                         Lauren [name] is an 79 y.o. female with a past medical history significant for hyperlipidemia, DM, TIA, left breast cancer status post lumpectomy, brought to [facility] ED by medics as a code stroke due to acute onset language impairment and headache/foggy head. She was last seen normal around 9 pm last night. She woke up at 8 am today which is unusual for her, and complained to her CNA of having a headache. The CNA stated that the pt couldn't talk right and was unable to get words out at 1045 so called niece and the niece came over.At 69 niece arrived and pt had difficulty speaking. As per EMS report, by the time they arrived to her house she was still having troubles getting words out but there is not report of alteration of consciousness, vertigo, double vision, focal weakness or numbness, or visual disturbances. Upon arrival to ED she had NIHSS 0. CT brain showed no acute intracranial abnormality but questionable atherosclerotic plaque or thrombus of a left MCA M2 branch. She takes aspirin 81 mg daily. Simvastatin 20 mg daily. Of significance, she had a similar episode 2 months ago.     Date last known well: 10/26/12  Time last known well: 9 pm. tPA Given: no, symptoms resolved, out of the window for thrombolysis.  Past Medical History  Diagnosis Date  . Diabetes mellitus   . Gout     injection in the past  . Fibrocystic breast   . Venous insufficiency   . Hyperlipidemia   . TIA (transient ischemic attack) 08/2008    neg MRI/MRA and CT and carotid dopplers  . Chronic constipation   . Gastritis   . Chronic headaches   . Pelvic fracture   . Breast cancer     left breast    Past Surgical History  Procedure Laterality Date  . Breast cyst aspiration     . Eye surgery      retinal, then cataract  . Breast lumpectomy  09/2007    with sentinel LN biopsy  . Appendectomy    . Breast lumpectomy      left    Family History  Problem Relation Age of Onset  . Breast cancer Sister   . Stroke Sister   . Cancer Sister     breast  . Breast cancer Sister   . Stroke Sister   . Cancer Sister     breast  . Stroke Mother   . Stroke Father   . Stroke Brother   . Cancer Brother     prostate  . Stroke Brother   . Stroke Sister  Social History:  reports that she has never smoked. She has never used smokeless tobacco. She reports that she does not drink alcohol or use illicit drugs.  Allergies:  Allergies  Allergen Reactions  . Aspirin     REACTION: GI  . Sulfonamide Derivatives     REACTION: itching    Medications:                                                                                                                           I have reviewed the patient's current medications.  ROS:                                                                                                                                       History obtained from the patient, family, chart review.  General ROS: negative for - chills, fatigue, fever, night sweats, weight gain or weight loss Psychological ROS: negative for - behavioral disorder, hallucinations, mood swings or suicidal ideation Ophthalmic ROS: negative for - blurry vision, double vision, eye pain or loss of vision ENT ROS: negative for - epistaxis, nasal discharge, oral lesions, sore throat, tinnitus or vertigo Allergy and Immunology ROS: negative for - hives or itchy/watery eyes Hematological and Lymphatic ROS: negative for - bleeding problems, bruising or swollen lymph nodes Endocrine ROS: negative for - galactorrhea, hair pattern changes, polydipsia/polyuria or temperature intolerance Respiratory ROS: negative for - cough, hemoptysis, shortness of breath or wheezing Cardiovascular  ROS: negative for - chest pain, dyspnea on exertion, edema or irregular heartbeat Gastrointestinal ROS: negative for - abdominal pain, diarrhea, hematemesis, nausea/vomiting or stool incontinence Genito-Urinary ROS: negative for - dysuria, hematuria, incontinence or urinary frequency/urgency Musculoskeletal ROS: negative for - joint swelling or muscular weakness Neurological ROS: as noted in HPI Dermatological ROS: negative for rash and skin lesion changes   Physical exam: pleasant female in no apparent distress.Height 5\' 3"  (1.6 m), weight 62.001 kg (136 lb 11 oz). BP 150/90, Pulse 82, R 16, Afebrile. Head: normocephalic. Neck: supple, no bruits, no JVD. Cardiac: no murmurs. Lungs: clear. Abdomen: soft, no tender, no mass. Extremities: no edema.     Neurologic Examination:  Mental Status: Alert, awake,oriented x 4, thought content appropriate. Comprehension, naming, and repetition intact. Speech fluent without evidence of aphasia.  Able to follow 3 step commands without difficulty. Cranial Nerves: II: Discs flat bilaterally; Visual fields grossly normal, pupils equal, round, reactive to light and accommodation III,IV, VI: ptosis not present, extra-ocular motions intact bilaterally V,VII: smile symmetric, facial light touch sensation normal bilaterally VIII: hearing normal bilaterally IX,X: gag reflex present XI: bilateral shoulder shrug XII: midline tongue extension Motor: Right : Upper extremity   5/5    Left:     Upper extremity   5/5  Lower extremity   5/5     Lower extremity   5/5 Tone and bulk:normal tone throughout; no atrophy noted Sensory: Pinprick and light touch intact throughout, bilaterally Deep Tendon Reflexes: 2+ and symmetric throughout Plantars: Right: downgoing   Left: downgoing Cerebellar: Action tremor right upper extremity. Finger to nose and heel to shin  normal bilaterally. Gait: no ataxia. CV: pulses palpable throughout    Results for orders placed during the hospital encounter of 10/27/12 (from the past 48 hour(s))  CBC     Status: Abnormal   Collection Time    10/27/12 12:28 PM      Result Value Range   WBC 10.8 (*) 4.0 - 10.5 K/uL   RBC 4.24  3.87 - 5.11 MIL/uL   Hemoglobin 13.0  12.0 - 15.0 g/dL   HCT 91.4  78.2 - 95.6 %   MCV 92.5  78.0 - 100.0 fL   MCH 30.7  26.0 - 34.0 pg   MCHC 33.2  30.0 - 36.0 g/dL   RDW 21.3  08.6 - 57.8 %   Platelets 192  150 - 400 K/uL  DIFFERENTIAL     Status: None   Collection Time    10/27/12 12:28 PM      Result Value Range   Neutrophils Relative % 72  43 - 77 %   Neutro Abs 7.7  1.7 - 7.7 K/uL   Lymphocytes Relative 18  12 - 46 %   Lymphs Abs 2.0  0.7 - 4.0 K/uL   Monocytes Relative 7  3 - 12 %   Monocytes Absolute 0.8  0.1 - 1.0 K/uL   Eosinophils Relative 2  0 - 5 %   Eosinophils Absolute 0.2  0.0 - 0.7 K/uL   Basophils Relative 1  0 - 1 %   Basophils Absolute 0.1  0.0 - 0.1 K/uL  POCT I-STAT TROPONIN I     Status: None   Collection Time    10/27/12 12:29 PM      Result Value Range   Troponin i, poc 0.00  0.00 - 0.08 ng/mL   Comment 3            Comment: Due to the release kinetics of cTnI,     a negative result within the first hours     of the onset of symptoms does not rule out     myocardial infarction with certainty.     If myocardial infarction is still suspected,     repeat the test at appropriate intervals.  POCT I-STAT, CHEM 8     Status: Abnormal   Collection Time    10/27/12 12:30 PM      Result Value Range   Sodium 141  135 - 145 mEq/L   Potassium 4.2  3.5 - 5.1 mEq/L   Chloride 104  96 - 112 mEq/L   BUN 17  6 -  23 mg/dL   Creatinine, Ser 7.84  0.50 - 1.10 mg/dL   Glucose, Bld 696 (*) 70 - 99 mg/dL   Calcium, Ion 2.95  2.84 - 1.30 mmol/L   TCO2 30  0 - 100 mmol/L   Hemoglobin 13.6  12.0 - 15.0 g/dL   HCT 13.2  44.0 - 10.2 %   Ct Head Wo Contrast  10/27/2012    *RADIOLOGY REPORT*  Clinical Data: 79 year old female Code stroke.  Slurred speech. Headache.  CT HEAD WITHOUT CONTRAST  Technique:  Contiguous axial images were obtained from the base of the skull through the vertex without contrast.  Comparison: 08/07/2010 and earlier.  Findings: Minor bubbly opacity right sphenoid sinus.  Other Visualized paranasal sinuses and mastoids are clear.  No acute osseous abnormality identified.  No acute orbit or scalp soft tissue findings.  Mild Calcified atherosclerosis at the skull base.  Questionable abnormal hyperdensity associated with a left MCA proximal M2 branch on series 2 image 10.  No acute loss of gray-white differentiation identified in the left hemisphere to suggest acute infarct.  Patchy periventricular white matter hypodensity has mildly progressed. No midline shift, mass effect, or evidence of mass lesion.  No acute intracranial hemorrhage identified.  No ventriculomegaly.  IMPRESSION: 1.  Questionable atherosclerotic plaque or thrombus of a left MCA M2 branch on series 2 image 10. 2.  No associated parenchymal CT findings of acute infarct identified. 3.  Mild progression of chronic white matter changes.  Study discussed by telephone with Dr. Leroy Kennedy on 10/27/2012 at 1236 hours.   Original Report Authenticated By: Erskine Speed, M.D.      Assessment: 79 y.o. female with several risk factors for stroke, brought to [facility]ED after sustaining a transient episode of language impairment associated with HA. Neuro-exam significant only for right UE action tremor. CT brain: ? atherosclerotic plaque or thrombus of a left MCA M2 branch. She most likely sustained a TIA involving left MCA distribution. Admit to complete TIA work up. Already on simvastatin 20 mg daily. No a candidate for the POINT trial, as onset of symptoms >12 hours ago.  Stroke team to resume care in the morning and make further recommendations accordingly.    Stroke Risk Factors - age, DM,  hyperlipidemia, recent TIA.  Plan: 1. HgbA1c, fasting lipid panel 2. MRI, MRA  of the brain without contrast 3. Echocardiogram 4. Carotid dopplers 5. Prophylactic therapy-plavix 75 mg daily. 6. Risk factor modification 7. Telemetry monitoring 8. Frequent neuro checks 9. PT/OT SLP   Wyatt Portela, MD [facility] [phone]  10/27/2012, 12:51 PM

## 2012-10-27 NOTE — ED Provider Notes (Signed)
History     CSN: [id number]  Arrival date & time 10/27/12  1215   First MD Initiated Contact with Patient 10/27/12 1219      Chief Complaint  Patient presents with  . Code Stroke    (Consider location/radiation/quality/duration/timing/severity/associated sxs/prior treatment) HPI Comments: 79 year old female with a history of TIA in the past, history of diabetes, breast cancer, presents with a complaint of abnormal mental status. According to the family member who reports that the nurse aide that found the patient at home this morning said that she was sleeping in bed after a clock in the morning which was very abnormal for her, she was having difficulty with speaking but was able to be awakened very easily. There was no report of any other focal neurologic deficits. The patient states at this time that she was having only a very mild headache, no other significant chronic or acute problems and was able to sleep well all night long. She denies dysuria, diarrhea, fevers chills nausea vomiting coughing or shortness of breath. The symptoms have resolved spontaneously, they were mild, unsure of time of onset as the patient was found to have them when she awoke this morning.  The history is provided by the patient.    Past Medical History  Diagnosis Date  . Diabetes mellitus   . Gout     injection in the past  . Fibrocystic breast   . Venous insufficiency   . Hyperlipidemia   . TIA (transient ischemic attack) 08/2008    neg MRI/MRA and CT and carotid dopplers  . Chronic constipation   . Gastritis   . Chronic headaches   . Pelvic fracture   . Breast cancer     left breast    Past Surgical History  Procedure Laterality Date  . Breast cyst aspiration    . Eye surgery      retinal, then cataract  . Breast lumpectomy  09/2007    with sentinel LN biopsy  . Appendectomy    . Breast lumpectomy      left    Family History  Problem Relation Age of Onset  . Breast cancer Sister   .  Stroke Sister   . Cancer Sister     breast  . Breast cancer Sister   . Stroke Sister   . Cancer Sister     breast  . Stroke Mother   . Stroke Father   . Stroke Brother   . Cancer Brother     prostate  . Stroke Brother   . Stroke Sister     History  Substance Use Topics  . Smoking status: Never Smoker   . Smokeless tobacco: Never Used  . Alcohol Use: No    OB History   Grav Para Term Preterm Abortions TAB SAB Ect Mult Living                  Review of Systems  Constitutional: Negative for fever and chills.  HENT: Negative for sore throat and neck pain.   Eyes: Negative for visual disturbance.  Respiratory: Negative for cough and shortness of breath.   Cardiovascular: Negative for chest pain.  Gastrointestinal: Negative for nausea, vomiting, abdominal pain and diarrhea.  Genitourinary: Negative for dysuria and frequency.  Musculoskeletal: Negative for back pain.  Skin: Negative for rash.  Neurological: Positive for speech difficulty and headaches. Negative for weakness and numbness.  Hematological: Negative for adenopathy.  Psychiatric/Behavioral: Negative for behavioral problems.  All other systems reviewed and  are negative.    Allergies  Aspirin and Sulfonamide derivatives  Home Medications   Current Outpatient Rx  Name  Route  Sig  Dispense  Refill  . acetaminophen (TYLENOL) 500 MG tablet   Oral   Take 500 mg by mouth every 6 (six) hours as needed.           Marland Kitchen aspirin 81 MG tablet   Oral   Take 81 mg by mouth daily.           . Calcium-Vitamin D 600-200 MG-UNIT per tablet   Oral   Take 1 tablet by mouth 2 (two) times daily.          . citalopram (CELEXA) 10 MG tablet   Oral   Take 1 tablet (10 mg total) by mouth daily.   30 tablet   11   . fish oil-omega-3 fatty acids 1000 MG capsule   Oral   Take 1 g by mouth daily.           Marland Kitchen glucose blood (FREESTYLE TEST STRIPS) test strip   Other   1 each by Other route as needed. Use as  instructed          . glucose blood (PRODIGY NO CODING BLOOD GLUC) test strip      Use as instructed   90 each   3     check glucose once daily and prn for DM (250.0)   . letrozole (FEMARA) 2.5 MG tablet   Oral   Take 1 tablet (2.5 mg total) by mouth daily.   30 tablet   5   . metFORMIN (GLUCOPHAGE) 500 MG tablet   Oral   Take 500 mg by mouth 2 (two) times daily with a meal.         . NON FORMULARY      1 tablet 2 (two) times daily. preservision (Lutein vit D)         . omeprazole (PRILOSEC) 40 MG capsule   Oral   Take 40 mg by mouth daily.           . polyethylene glycol (MIRALAX / GLYCOLAX) packet   Oral   Take 17 g by mouth as needed.   14 each   6   . Probiotic Product (PROBIOTIC DAILY PO)   Oral   Take 1 tablet by mouth daily.         . simvastatin (ZOCOR) 20 MG tablet   Oral   Take 1 tablet (20 mg total) by mouth at bedtime.   30 tablet   11     BP 143/68  Pulse 71  Temp(Src) 98.2 F (36.8 C) (Oral)  Resp 23  Ht 5\' 3"  (1.6 m)  Wt 136 lb 11 oz (62.001 kg)  BMI 24.22 kg/m2  SpO2 99%  Physical Exam  Nursing note and vitals reviewed. Constitutional: She appears well-developed and well-nourished. No distress.  HENT:  Head: Normocephalic and atraumatic.  Mouth/Throat: Oropharynx is clear and moist. No oropharyngeal exudate.  Eyes: Conjunctivae and EOM are normal. Pupils are equal, round, and reactive to light. Right eye exhibits no discharge. Left eye exhibits no discharge. No scleral icterus.  Neck: Normal range of motion. Neck supple. No JVD present. No thyromegaly present.  Cardiovascular: Normal rate, regular rhythm, normal heart sounds and intact distal pulses.  Exam reveals no gallop and no friction rub.   No murmur heard. Pulmonary/Chest: Effort normal and breath sounds normal. No respiratory distress. She has no wheezes.  She has no rales.  Abdominal: Soft. Bowel sounds are normal. She exhibits no distension and no mass. There is no  tenderness.  Musculoskeletal: Normal range of motion. She exhibits no edema and no tenderness.  Lymphadenopathy:    She has no cervical adenopathy.  Neurological: She is alert. Coordination normal.  The patient has normal speech, normal memory, normal coordination of all 4 extremities without any limb ataxia. The patient has normal strength of all 4 extremities at the major muscle groups including shoulders, biceps, triceps, forearm, grips, quadriceps, hamstrings, calves. Normal sensation to light touch and pinprick of all 4 extremities and the trunk. No truncal ataxia, cranial nerves III through XII are intact.  Normal peripheral visual fields. Normal extraocular movements. Normal pupillary exam. No pronator drift, normal reflexes at the brachial radialis, biceps and patellar tendons. Normal position sense, normal temperature sensation to cold and warm  Skin: Skin is warm and dry. No rash noted. No erythema.  Psychiatric: She has a normal mood and affect. Her behavior is normal.    ED Course  Procedures (including critical care time)  Labs Reviewed  CBC - Abnormal; Notable for the following:    WBC 10.8 (*)    All other components within normal limits  COMPREHENSIVE METABOLIC PANEL - Abnormal; Notable for the following:    Glucose, Bld 113 (*)    GFR calc non Af Amer 82 (*)    All other components within normal limits  POCT I-STAT, CHEM 8 - Abnormal; Notable for the following:    Glucose, Bld 116 (*)    All other components within normal limits  ETHANOL  PROTIME-INR  APTT  DIFFERENTIAL  TROPONIN I  URINE RAPID DRUG SCREEN (HOSP PERFORMED)  URINALYSIS, ROUTINE W REFLEX MICROSCOPIC  GLUCOSE, CAPILLARY  POCT I-STAT TROPONIN I   Ct Head Wo Contrast  10/27/2012   *RADIOLOGY REPORT*  Clinical Data: 79 year old female Code stroke.  Slurred speech. Headache.  CT HEAD WITHOUT CONTRAST  Technique:  Contiguous axial images were obtained from the base of the skull through the vertex without  contrast.  Comparison: 08/07/2010 and earlier.  Findings: Minor bubbly opacity right sphenoid sinus.  Other Visualized paranasal sinuses and mastoids are clear.  No acute osseous abnormality identified.  No acute orbit or scalp soft tissue findings.  Mild Calcified atherosclerosis at the skull base.  Questionable abnormal hyperdensity associated with a left MCA proximal M2 branch on series 2 image 10.  No acute loss of gray-white differentiation identified in the left hemisphere to suggest acute infarct.  Patchy periventricular white matter hypodensity has mildly progressed. No midline shift, mass effect, or evidence of mass lesion.  No acute intracranial hemorrhage identified.  No ventriculomegaly.  IMPRESSION: 1.  Questionable atherosclerotic plaque or thrombus of a left MCA M2 branch on series 2 image 10. 2.  No associated parenchymal CT findings of acute infarct identified. 3.  Mild progression of chronic white matter changes.  Study discussed by telephone with Dr. Leroy Kennedy on 10/27/2012 at 1236 hours.   Original Report Authenticated By: Erskine Speed, M.D.     1. TIA (transient ischemic attack)       MDM  The patient has what is likely another transient ischemic attack. Dr. Leroy Kennedy with neurology has seen the patient and agrees that the patient needs to be admitted and have further workup for TIA. She has resolved back to her baseline at this time, her EKG shows normal sinus rhythm without any acute findings and she appears neurologically  stable at this time. No signs of carotid obstruction, no signs of coronary ischemia.  ED ECG REPORT  I personally interpreted this EKG   Date: 10/27/2012   Rate: 78  Rhythm: normal sinus rhythm  QRS Axis: normal  Intervals: normal  ST/T Wave abnormalities: normal  Conduction Disutrbances:none  Narrative Interpretation:   Old EKG Reviewed: Compared with 08/07/2010, no significant changes are seen other than Q waves no longer seen in lead 3  The patient appears  stable, discussed with neurology who wants the patient admitted for TIA workup, CT scan shows no acute large area infarct, has no significant laboratory findings in can be admitted for observation to a neuro telemetry floor, discussed with hospitalist who accepted consult for admission.       Vida Roller, MD 10/27/12 1440

## 2012-10-27 NOTE — ED Notes (Signed)
Pt returned from radiology.

## 2012-10-27 NOTE — ED Notes (Signed)
Dr Miller at bedside. 

## 2012-10-27 NOTE — Progress Notes (Signed)
VASCULAR LAB PRELIMINARY  PRELIMINARY  PRELIMINARY  PRELIMINARY  Carotid Dopplers completed.    Preliminary report:  There is 0-39% ICA stenosis.  Vertebral artery flow is antegrade.  Cassidy Tabet, RVT 10/27/2012, 4:20 PM

## 2012-10-27 NOTE — ED Notes (Signed)
Attempted report 

## 2012-10-27 NOTE — ED Notes (Signed)
At this time pt not experiencing any difficult ing speaking, no facial droop, no slurred speech. Equal hand grips.

## 2012-10-27 NOTE — ED Notes (Addendum)
Pt had tremor to right upper extremity. Pt reports she has had that for a long time but never been evaluated for that.

## 2012-10-27 NOTE — ED Notes (Signed)
Per EMS - pt woke up around 8am, pt's CNA arrived at house around this time then gradually got a HA. Pt was acting normal. The CNA stated that the pt couldn't talk right and was unable to get words out at 1045 so called niece and the niece came over.At 11 niece arrived and pt had difficulty speaking. Upon EMS arrival pt still experiencing some difficulty getting words out but no other neuro deficits. EMS started a 20 G in right hand. BP 164/88 HR 80s a/fib. Pt doesn;t take bld thinners.

## 2012-10-28 DIAGNOSIS — E119 Type 2 diabetes mellitus without complications: Secondary | ICD-10-CM | POA: Diagnosis not present

## 2012-10-28 DIAGNOSIS — G459 Transient cerebral ischemic attack, unspecified: Secondary | ICD-10-CM

## 2012-10-28 DIAGNOSIS — G43809 Other migraine, not intractable, without status migrainosus: Secondary | ICD-10-CM

## 2012-10-28 LAB — BASIC METABOLIC PANEL
BUN: 19 mg/dL (ref 6–23)
Calcium: 10.1 mg/dL (ref 8.4–10.5)
Chloride: 102 mEq/L (ref 96–112)
Creatinine, Ser: 0.76 mg/dL (ref 0.50–1.10)
GFR calc Af Amer: 89 mL/min — ABNORMAL LOW (ref >90)

## 2012-10-28 LAB — CBC
HCT: 39.1 % (ref 36.0–46.0)
MCH: 30.9 pg (ref 26.0–34.0)
MCHC: 33.5 g/dL (ref 30.0–36.0)
MCV: 92.2 fL (ref 78.0–100.0)
Platelets: 210 10*3/uL (ref 150–400)
RDW: 13.9 % (ref 11.5–15.5)
WBC: 11.4 10*3/uL — ABNORMAL HIGH (ref 4.0–10.5)

## 2012-10-28 LAB — LIPID PANEL
LDL Cholesterol: 59 mg/dL (ref 0–99)
VLDL: 29 mg/dL (ref 0–40)

## 2012-10-28 LAB — GLUCOSE, CAPILLARY: Glucose-Capillary: 148 mg/dL — ABNORMAL HIGH (ref 70–99)

## 2012-10-28 MED ORDER — ACETAMINOPHEN 325 MG PO TABS
650.0000 mg | ORAL_TABLET | Freq: Four times a day (QID) | ORAL | Status: DC | PRN
  Administered 2012-10-28: 650 mg via ORAL
  Filled 2012-10-28: qty 2

## 2012-10-28 MED ORDER — LORATADINE 10 MG PO TABS: 10.0000 mg | ORAL_TABLET | Freq: Every day | ORAL | Status: DC

## 2012-10-28 MED ORDER — BUTALBITAL-APAP-CAFFEINE 50-325-40 MG PO TABS: 1.0000 | ORAL_TABLET | ORAL | Status: DC | PRN

## 2012-10-28 MED ORDER — LORATADINE 5 MG/5ML PO SYRP: 10.0000 mg | ORAL_SOLUTION | Freq: Every day | ORAL | Status: DC

## 2012-10-28 MED ORDER — LORATADINE 10 MG PO TABS
10.0000 mg | ORAL_TABLET | Freq: Every day | ORAL | Status: DC
  Administered 2012-10-28: 10 mg via ORAL
  Filled 2012-10-28 (×2): qty 1

## 2012-10-28 MED ORDER — CLOPIDOGREL BISULFATE 75 MG PO TABS: 75.0000 mg | ORAL_TABLET | Freq: Every day | ORAL | Status: DC

## 2012-10-28 NOTE — Evaluation (Signed)
Physical Therapy Evaluation Patient Details Name: Lauren [name] MRN: [id number] DOB: 1927/10/19 Today's Date: 10/28/2012 Time: 4098-1191 PT Time Calculation (min): 30 min  PT Assessment / Plan / Recommendation Clinical Impression    Pt admitted with ?TIA. Pt appears to be at baseline and no further PT needed.     PT Assessment  Patent does not need any further PT services    Follow Up Recommendations  No PT follow up    Does the patient have the potential to tolerate intense rehabilitation      Barriers to Discharge        Equipment Recommendations  None recommended by PT    Recommendations for Other Services     Frequency      Precautions / Restrictions Precautions Precautions: Fall Restrictions Weight Bearing Restrictions: No   Pertinent Vitals/Pain See flow sheet      Mobility  Bed Mobility Bed Mobility: Supine to Sit;Sitting - Scoot to Edge of Bed Supine to Sit: 6: Modified independent (Device/Increase time);With rails Sitting - Scoot to Edge of Bed: 6: Modified independent (Device/Increase time) Details for Bed Mobility Assistance: incr time Transfers Transfers: Sit to Stand;Stand to Sit Sit to Stand: 6: Modified independent (Device/Increase time);With upper extremity assist;From bed;From toilet Stand to Sit: 6: Modified independent (Device/Increase time);With upper extremity assist;To bed;To toilet Ambulation/Gait Ambulation/Gait Assistance: 6: Modified independent (Device/Increase time) Ambulation Distance (Feet): 500 Feet Assistive device: None Gait Pattern: Step-through pattern;Decreased stride length;Narrow base of support Gait velocity: 2.90ft/sec    Exercises     PT Diagnosis:    PT Problem List:   PT Treatment Interventions:     PT Goals    Visit Information  Last PT Received On: 10/28/12 Assistance Needed: +1    Subjective Data  Subjective: Pt states she is tired. Patient Stated Goal: Return home   Prior Functioning  Home  Living Lives With: Family (2 demented sisters with 24 hour caregivers) Available Help at Discharge: Other (Comment) Type of Home: House Home Access: Stairs to enter [employer] of Steps: 2 Entrance Stairs-Rails: Right Home Layout: Laundry or work area in basement [employer] Shower/Tub: Engineer, manufacturing systems: Standard Home Adaptive Equipment: Environmental consultant - rolling;Straight cane;Tub transfer bench;Raised toilet seat with rails Prior Function Level of Independence: Independent Able to Take Stairs?: Yes Driving: Yes Vocation: Retired Musician: No difficulties Dominant Hand: Right    Cognition  Cognition Arousal/Alertness: Awake/alert Behavior During Therapy: WFL for tasks assessed/performed Overall Cognitive Status: Within Functional Limits for tasks assessed    Extremity/Trunk Assessment Right Lower Extremity Assessment RLE ROM/Strength/Tone: [facility] for tasks assessed Left Lower Extremity Assessment LLE ROM/Strength/Tone: WFL for tasks assessed   Balance Balance Balance Assessed: Yes Static Standing Balance Static Standing - Balance Support: No upper extremity supported Static Standing - Level of Assistance: 7: Independent Static Standing - Comment/# of Minutes: stood x 4 minutes Dynamic Standing Balance Dynamic Standing - Balance Support: No upper extremity supported Dynamic Standing - Level of Assistance: 6: Modified independent (Device/Increase time)  End of Session PT - End of Session Equipment Utilized During Treatment: Gait belt Activity Tolerance: Patient tolerated treatment well Patient left: in bed;with bed alarm set;with family/visitor present Nurse Communication: Mobility status;Patient requests pain meds  GP     Chemere Steffler 10/28/2012, 10:03 AM  [facility] PT [phone]

## 2012-10-28 NOTE — Evaluation (Signed)
Occupational Therapy Evaluation Patient Details Name: Lauren [name] MRN: [id number] DOB: 1928/02/20 Today's Date: 10/28/2012 Time: 1308-6578 OT Time Calculation (min): 29 min  OT Assessment / Plan / Recommendation Clinical Impression   This 79 y.o. Female admitted with AMS, dizziness, and difficulty speaking.  Pt worked up for stroke.  MRI negative, and pt is back to baseline.  No deficits noted.  Pt was instructed in s/s of stroke.  No further OT needs identified, will sign off.     OT Assessment  Patient does not need any further OT services    Follow Up Recommendations  No OT follow up    Barriers to Discharge      Equipment Recommendations  None recommended by OT    Recommendations for Other Services    Frequency       Precautions / Restrictions Precautions Precautions: Fall Restrictions Weight Bearing Restrictions: No       ADL  Eating/Feeding: Independent Where Assessed - Eating/Feeding: Chair;Bed level Grooming: Wash/dry hands;Wash/dry face;Teeth care;Denture care;Independent Where Assessed - Grooming: Unsupported standing Upper Body Bathing: Set up Where Assessed - Upper Body Bathing: Unsupported sitting Lower Body Bathing: Set up Where Assessed - Lower Body Bathing: Unsupported sit to stand Upper Body Dressing: Set up Where Assessed - Upper Body Dressing: Unsupported sitting Lower Body Dressing: Set up Where Assessed - Lower Body Dressing: Unsupported sit to stand Toilet Transfer: Independent Toilet Transfer Method: Sit to stand;Stand pivot Acupuncturist: Comfort height toilet Toileting - Clothing Manipulation and Hygiene: Independent Where Assessed - Engineer, mining and Hygiene: Standing Tub/Shower Transfer: Supervision/safety Tub/Shower Transfer Method: Science writer: Transfer tub bench Transfers/Ambulation Related to ADLs: Indpeendent ADL Comments: Pt is back to baseline    OT Diagnosis:    OT  Problem List:   OT Treatment Interventions:     OT Goals    Visit Information  Last OT Received On: 10/28/12 Assistance Needed: +1    Subjective Data  Subjective: "I feel pretty good" Patient Stated Goal: To go home   Prior Functioning     Home Living Lives With: Family (2 demented sisters with 24 hour caregivers) Available Help at Discharge: Other (Comment) Type of Home: House Home Access: Stairs to enter [employer] of Steps: 2 Entrance Stairs-Rails: Right Home Layout: Laundry or work area in basement [employer] Shower/Tub: Engineer, manufacturing systems: Standard Home Adaptive Equipment: Environmental consultant - rolling;Straight cane;Tub transfer bench;Raised toilet seat with rails Prior Function Level of Independence: Independent Able to Take Stairs?: Yes Driving: Yes (locally only`) Vocation: Retired Musician: No difficulties Dominant Hand: Right         Vision/Perception Vision - History Baseline Vision: Wears glasses all the time Patient Visual Report: No change from baseline Vision - Assessment Eye Alignment: Within Functional Limits Vision Assessment: Vision not tested Additional Comments: Pt able to read with no difficulty Perception Perception: Within Functional Limits Praxis Praxis: Intact   Cognition  Cognition Arousal/Alertness: Awake/alert Behavior During Therapy: WFL for tasks assessed/performed Overall Cognitive Status: Within Functional Limits for tasks assessed    Extremity/Trunk Assessment Right Upper Extremity Assessment RUE ROM/Strength/Tone: Within functional levels RUE Sensation: WFL - Light Touch RUE Coordination: WFL - gross/fine motor Left Upper Extremity Assessment LUE ROM/Strength/Tone: Within functional levels LUE Sensation: WFL - Light Touch LUE Coordination: WFL - gross/fine motor Right Lower Extremity Assessment RLE ROM/Strength/Tone: WFL for tasks assessed Left Lower Extremity Assessment LLE  ROM/Strength/Tone: WFL for tasks assessed Trunk Assessment Trunk Assessment: Normal  Mobility Bed Mobility Bed Mobility: Sit to Supine;Supine to Sit;Sitting - Scoot to Edge of Bed Supine to Sit: 7: Independent Sitting - Scoot to [place] of Bed: 7: Independent Sit to Supine: 7: Independent Details for Bed Mobility Assistance: incr time Transfers Transfers: Sit to Stand;Stand to Sit Sit to Stand: 7: Independent Stand to Sit: 7: Independent     Exercise     Balance Balance Balance Assessed: Yes Static Standing Balance Static Standing - Balance Support: No upper extremity supported Static Standing - Level of Assistance: 7: Independent Static Standing - Comment/# of Minutes: stood x 4 minutes Dynamic Standing Balance Dynamic Standing - Balance Support: No upper extremity supported Dynamic Standing - Level of Assistance: 6: Modified independent (Device/Increase time)   End of Session OT - End of Session Activity Tolerance: Patient tolerated treatment well Patient left: in bed;with call bell/phone within reach;with bed alarm set  GO Functional Limitation: Self care Self Care Current Status (Z6109): 0 percent impaired, limited or restricted Self Care Goal Status (U0454): 0 percent impaired, limited or restricted Self Care Discharge Status (U9811): 0 percent impaired, limited or restricted   Lauren [name] M 10/28/2012, 10:37 AM

## 2012-10-28 NOTE — Progress Notes (Signed)
Stroke Team Progress Note  HISTORY  Lauren [name] is an 79 y.o. female with a past medical history significant for hyperlipidemia, DM, TIA, left breast cancer status post lumpectomy, brought to [facility] ED by medics as a code stroke due to acute onset language impairment and headache/foggy head.  She was last seen normal around 9 pm last night. She woke up at 8 am today which is unusual for her, and complained to her CNA of having a headache. The CNA stated that the pt couldn't talk right and was unable to get words out at 1045 so called niece and the niece came over.At 62 niece arrived and pt had difficulty speaking.  As per EMS report, by the time they arrived to her house she was still having troubles getting words out but there is not report of alteration of consciousness, vertigo, double vision, focal weakness or numbness, or visual disturbances.  Upon arrival to ED she had NIHSS 0.  CT brain showed no acute intracranial abnormality but questionable atherosclerotic plaque or thrombus of a left MCA M2 branch.  She takes aspirin 81 mg daily. Simvastatin 20 mg daily.  Of significance, she had a similar episode 2 months ago.    Date last known well: 10/26/12  Time last known well: 9 pm.  tPA Given: no, symptoms resolved, out of the window for thrombolysis.  SUBJECTIVE Her symptoms are at the baseline  Overall she feels her condition is controlled. No new symptoms  OBJECTIVE Most recent Vital Signs: Filed Vitals:   10/27/12 2038 10/27/12 2218 10/28/12 0239 10/28/12 0516  BP: 152/68 119/54 134/49 150/60  Pulse: 75 72 80 63  Temp: 97.8 F (36.6 C) 98 F (36.7 C) 97.9 F (36.6 C) 97.4 F (36.3 C)  TempSrc: Oral Oral Oral Oral  Resp: 18 18 18 18   Height:      Weight:      SpO2: 96% 97% 98% 96%   CBG (last 3)   Recent Labs  10/27/12 1244 10/27/12 2220 10/28/12 0653  GLUCAP 88 135* 88    IV Fluid Intake:     MEDICATIONS  . sodium chloride   Intravenous STAT  . calcium-vitamin  D  1 tablet Oral BID  . citalopram  10 mg Oral Daily  . clopidogrel  75 mg Oral Q breakfast  . enoxaparin (LOVENOX) injection  40 mg Subcutaneous Q24H  . letrozole  2.5 mg Oral Daily  . omega-3 acid ethyl esters  1 g Oral Daily  . pantoprazole  40 mg Oral Daily  . polyethylene glycol  17 g Oral Daily  . simvastatin  20 mg Oral QHS  . sodium chloride  3 mL Intravenous Q12H   PRN:  acetaminophen, alum & mag hydroxide-simeth, ondansetron (ZOFRAN) IV, ondansetron, senna-docusate  Diet:  Carb Control thin liquids Activity:  Up with assistance DVT Prophylaxis:  lovenox.  CLINICALLY SIGNIFICANT STUDIES Basic Metabolic Panel:  Recent Labs Lab 10/27/12 1228 10/27/12 1230 10/27/12 1958 10/28/12 0550  NA 139 141  --  140  K 4.2 4.2  --  4.4  CL 102 104  --  102  CO2 28  --   --  28  GLUCOSE 113* 116*  --  96  BUN 16 17  --  19  CREATININE 0.61 0.70 1.04 0.76  CALCIUM 10.1  --   --  10.1   Liver Function Tests:  Recent Labs Lab 10/27/12 1228  AST 15  ALT 9  ALKPHOS 64  BILITOT 0.4  PROT 7.3  ALBUMIN 3.6   CBC:  Recent Labs Lab 10/27/12 1228  10/27/12 1958 10/28/12 0550  WBC 10.8*  --  12.9* 11.4*  NEUTROABS 7.7  --   --   --   HGB 13.0  < > 12.8 13.1  HCT 39.2  < > 38.6 39.1  MCV 92.5  --  92.3 92.2  PLT 192  --  211 210  < > = values in this interval not displayed. Coagulation:  Recent Labs Lab 10/27/12 1228  LABPROT 12.7  INR 0.96   Cardiac Enzymes:  Recent Labs Lab 10/27/12 1229  TROPONINI <0.30   Urinalysis:  Recent Labs Lab 10/27/12 1256  COLORURINE YELLOW  LABSPEC 1.008  PHURINE 7.5  GLUCOSEU NEGATIVE  HGBUR NEGATIVE  BILIRUBINUR NEGATIVE  KETONESUR NEGATIVE  PROTEINUR NEGATIVE  UROBILINOGEN 0.2  NITRITE NEGATIVE  LEUKOCYTESUR NEGATIVE   Lipid Panel    Component Value Date/Time   CHOL 134 10/28/2012 0550   TRIG 143 10/28/2012 0550   HDL 46 10/28/2012 0550   CHOLHDL 2.9 10/28/2012 0550   VLDL 29 10/28/2012 0550   LDLCALC 59 10/28/2012 0550    HgbA1C  Lab Results  Component Value Date   HGBA1C 6.3* 10/27/2012    Urine Drug Screen:     Component Value Date/Time   LABOPIA NONE DETECTED 10/27/2012 2119   COCAINSCRNUR NONE DETECTED 10/27/2012 2119   LABBENZ NONE DETECTED 10/27/2012 2119   AMPHETMU NONE DETECTED 10/27/2012 2119   THCU NONE DETECTED 10/27/2012 2119   LABBARB NONE DETECTED 10/27/2012 2119    Alcohol Level:  Recent Labs Lab 10/27/12 1228  ETH <11    Ct Head Wo Contrast 10/27/2012  : 1.  Questionable atherosclerotic plaque or thrombus of a left MCA M2 branch on series 2 image 10. 2.  No associated parenchymal CT findings of acute infarct identified. 3.  Mild progression of chronic white matter changes.  Study discussed by telephone with Dr. Leroy Kennedy on 10/27/2012 at 1236 hours.    Mr Maxine Glenn Head Wo Contrast 10/27/2012  MRI HEAD : No acute infarct.  Please see above.   MRA HEAD  Findings: Ectatic distal vertical cervical segment of the right internal carotid artery.  Moderate stenosis cavernous segment left internal carotid artery.  Mild bulge right internal carotid artery cavernous segment felt to be related to atherosclerotic type changes rather than aneurysm.  Slightly bulbous appearance of the supraclinoid aspect of the internal carotid artery bilaterally without discrete aneurysm.  Hypoplastic A1 segment of the left anterior cerebral artery.  Mild narrowing irregularity of the M1 segment of the left middle cerebral artery.  Mild middle cerebral artery branch vessel irregularity greater on the left.  Ectatic vertebral arteries and basilar artery.  Right vertebral artery is dominant.  Slight irregularity of large left PICA.  Nonvisualization right PICA.  Minimal narrowing mid aspect of the basilar artery.  Nonvisualization left AICA.  Irregular narrowed right AICA.  Mild to moderate narrowing involving portions of the posterior cerebral artery bilaterally more notable on the right. Mr Brain Wo Contrast  2D Echocardiogram  EF 60%. No  cardiac source of emboli is noted.  Carotid Doppler Bilateral: mild soft plaque. 0-49% ICA stenosis. Vertebral artery flow is antegrade.  CXR    EKG  normal sinus rhythm.   Therapy Recommendations   Physical Exam   Pleasant elderly lady not in distress.Awake alert. Afebrile. Head is nontraumatic. Neck is supple without bruit. Hearing is normal. Cardiac exam no murmur or gallop. Lungs are  clear to auscultation. Distal pulses are well felt. Neurological Exam ;  Awake  Alert oriented x 3. Mildly nonfluent speech and language with occasional word hesitancy.[place] movements full without nystagmus.fundi were not visualized. Vision acuity and fields appear normal. Hearing is normal. Palatal movements are normal. Face symmetric. Tongue midline. Normal strength, tone, reflexes and coordination. Normal sensation. Gait deferred. ASSESSMENT Lauren [name] is a 79 y.o. female presenting with expressive aphasia. Imaging confirms a remote small right frontal lobe infarct. Non visualization of the left A1 ACa and R PICA.   Infarct felt to be thrombotic.  On aspirin 81 mg orally every day prior to admission. Now on clopidogrel 75 mg orally every day for secondary stroke prevention. Patient with resultant expressive aphasia Work up underway.   Diabetes mellitius   Hyperlipidemia  Tranasient ischemic attack  Chronic headaches   Breast cancer   Hospital day # 1  TREATMENT/PLAN  Continue clopidogrel 75 mg orally every day for secondary stroke prevention.  Risk factor modification  Following therapy recommendations, may be discharged to home from neurological standpoint and have patient arrange followup with Dr. Pearlean Brownie in 2 months.  Gwendolyn Lima. Manson Passey, [facility], MBA, MHA [facility] Pager: [phone] 10/28/2012 8:27 AM  I have personally obtained a history, examined the patient, evaluated imaging results, and formulated the assessment and plan of care. I agree with the above. Delia Heady, MD

## 2012-10-28 NOTE — Progress Notes (Signed)
UR complete.  Klynn Linnemann RN, MSN 

## 2012-10-28 NOTE — Discharge Summary (Addendum)
Physician Discharge Summary  [name] AVW:[id number] DOB: 1928-04-24 DOA: 10/27/2012  PCP: Lauren Manns, MD  Admit date: 10/27/2012 Discharge date: 10/28/2012  Time spent: 45 minutes  Recommendations for Outpatient Follow-up:  Patient can follow with her headache specialist if she continues to experience headaches  Discharge Diagnoses:  TIA   DIABETES MELLITUS, TYPE II - for now we will recommend diet control only HEADACHE OF UNCLEAR ETIOLOGY History of breast cancer, left Hypoglycemia Chronic allergic rhinitis and sphenoid sinusitis-potential source for headaches Artifact on the CT scan of the brain  Discharge Condition: Good, neurologically intact  Diet recommendation: Heart healthy  Filed Weights   10/27/12 1237  Weight: 62.001 kg (136 lb 11 oz)    History of present illness:  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 She was last seen normal at 9 PM June 4. This morning she slept later than usual awakening at 8 AM. She was dizzy, confused, and unable to speak. She conveyed to her family that she had a headache. In the emergency department she was seen by neurology. He did not administer TPA do to uncertain time of onset of symptoms. [facility] was called to admit the patient for TIA workup. The history was gathered from her niece and power of attorney. The patient lives at home with her 2 demented sisters they are cared for round-the-clock by CNAs and family members. The patient normally ambulates on her own without assistance. Her niece reports an episode of significant hypoglycemia earlier this week. The patient's main complaint is a lack of energy, and the feeling of exhaustion. In the emergency department the patient's symptoms resolved. She was found to have an  abnormality in her MCA on CT scan. MRI has been ordered for further evaluation   Hospital Course:  Transient ischemic attack-patient was placed on observation, had frequent neurological checks and telemetry monitoring. MRI of the brain was negative for acute stroke. Patient was seen in consultation by Dr. Pearlean Brownie from neurology who recommended that we change aspirin to Plavix.  Seasonal allergy-patient was started on loratadine round-the-clock Recurrent headaches-unclear etiology-prescribed Fioricet as needed. MRI of the brain ruled out any life-threatening causes. Patient may followup with her headache specialist Hypoglycemia-recommend stopping metformin  Procedures: Ct Head Wo Contrast  10/27/2012 : 1. Questionable atherosclerotic plaque or thrombus of a left MCA M2 branch on series 2 image 10. 2. No associated parenchymal CT findings of acute infarct identified. 3. Mild progression of chronic white matter changes. Study discussed by telephone with Dr. Leroy Kennedy on 10/27/2012 at 1236 hours.  Mr Lauren [name] Head Wo Contrast  10/27/2012  MRI HEAD : No acute infarct. Please see above.  MRA HEAD Findings: Ectatic distal vertical cervical segment of the right internal carotid artery. Moderate stenosis cavernous segment left internal carotid artery. Mild bulge right internal carotid artery cavernous segment felt to be related to atherosclerotic type changes rather than aneurysm. Slightly bulbous appearance of the supraclinoid aspect of the internal carotid artery bilaterally without discrete aneurysm. Hypoplastic A1 segment of the left anterior cerebral artery. Mild narrowing irregularity of the M1 segment of the left middle cerebral artery. Mild middle cerebral artery branch vessel irregularity greater on the left. Ectatic vertebral arteries and basilar artery. Right vertebral artery is dominant. Slight irregularity of large left PICA. Nonvisualization right PICA. Minimal narrowing mid aspect of  the basilar artery.  Nonvisualization left AICA. Irregular narrowed right AICA. Mild to moderate narrowing involving portions of the posterior cerebral artery bilaterally more notable on the right. Mr Brain Wo Contrast  2D Echocardiogram EF 60%. No cardiac source of emboli is noted.  Carotid Doppler Bilateral: mild soft plaque. 0-49% ICA stenosis. Vertebral artery flow is antegrade  Consultations:  Neurology  Discharge Exam: Filed Vitals:   10/28/12 0905 10/28/12 0910 10/28/12 0912 10/28/12 0957  BP: 129/69 130/61 137/69 134/64  Pulse: 87 92 97 76  Temp:    98.3 F (36.8 C)  TempSrc:    Oral  Resp:    18  Height:      Weight:      SpO2:    97%    General: Alert and oriented x3 Cardiovascular: Regular rate and rhythm without murmur Respiratory: Clear to auscultation bilaterally  Discharge Instructions  Discharge Orders   Future Appointments Provider Department Dept Phone   12/22/2012 10:00 AM [facility] [phone]   Patient should wear two piece clothing and wear no powder or deodorant. Patient should arrive 15 minutes early.   02/21/2013 9:15 AM Sherrie George, MD [facility] [phone]   03/28/2013 8:00 AM [facility] [place] [phone]   04/04/2013 10:30 AM Judy Pimple, MD [facility] [place] [phone]   10/18/2013 10:15 AM Dava Najjar Idelle Jo [facility] [phone]   10/18/2013 10:45 AM Augustin Schooling, NP [facility] [phone]   Future Orders Complete By Expires     Diet - low sodium heart healthy  As directed     Increase activity slowly  As directed         Medication List    STOP taking these medications       aspirin 81 MG tablet     glucose blood test strip  Commonly known as:  PRODIGY NO CODING BLOOD GLUC     metFORMIN 500 MG tablet  Commonly known as:  GLUCOPHAGE     NON FORMULARY       TAKE these medications       acetaminophen 500 MG tablet  Commonly known as:  TYLENOL  Take 500 mg by mouth every 6 (six) hours as needed.     butalbital-acetaminophen-caffeine 50-325-40 MG per tablet  Commonly known as:  FIORICET, ESGIC  Take 1 tablet by mouth every 4 (four) hours as needed for headache.     Calcium-Vitamin D 600-200 MG-UNIT per tablet  Take 1 tablet by mouth 2 (two) times daily.     citalopram 10 MG tablet  Commonly known as:  CELEXA  Take 1 tablet (10 mg total) by mouth daily.     clopidogrel 75 MG tablet  Commonly known as:  PLAVIX  Take 1 tablet (75 mg total) by mouth daily with breakfast.     fish oil-omega-3 fatty acids 1000 MG capsule  Take 1 g by mouth daily.     letrozole 2.5 MG tablet  Commonly known as:  FEMARA  Take 1 tablet (2.5 mg total) by mouth daily.     loratadine 10 MG tablet  Commonly known as:  CLARITIN  Take 1 tablet (10 mg total) by mouth daily.     omeprazole 40 MG capsule  Commonly known as:  PRILOSEC  Take 40 mg by mouth daily.     polyethylene glycol packet  Commonly known as:  MIRALAX / GLYCOLAX  Take 17 g by mouth as needed.     PROBIOTIC DAILY PO  Take 1 tablet by mouth daily.     simvastatin 20 MG tablet  Commonly known as:  ZOCOR  Take 1 tablet (20 mg total) by mouth at bedtime.       Allergies  Allergen Reactions  . Aspirin     REACTION: GI  . Sulfonamide Derivatives     REACTION: itching       Follow-up Information   Schedule an appointment as soon as possible for a visit with Lauren Manns, MD.   Contact information:   [address] [place] [address] [place] [place] [address] [phone]        The results of significant diagnostics from this hospitalization (including imaging, microbiology, ancillary and laboratory) are listed below for reference.    Significant Diagnostic Studies: Ct Head Wo Contrast  10/27/2012   *RADIOLOGY REPORT*  Clinical Data: 79 year old female Code stroke.   Slurred speech. Headache.  CT HEAD WITHOUT CONTRAST  Technique:  Contiguous axial images were obtained from the base of the skull through the vertex without contrast.  Comparison: 08/07/2010 and earlier.  Findings: Minor bubbly opacity right sphenoid sinus.  Other Visualized paranasal sinuses and mastoids are clear.  No acute osseous abnormality identified.  No acute orbit or scalp soft tissue findings.  Mild Calcified atherosclerosis at the skull base.  Questionable abnormal hyperdensity associated with a left MCA proximal M2 branch on series 2 image 10.  No acute loss of gray-white differentiation identified in the left hemisphere to suggest acute infarct.  Patchy periventricular white matter hypodensity has mildly progressed. No midline shift, mass effect, or evidence of mass lesion.  No acute intracranial hemorrhage identified.  No ventriculomegaly.  IMPRESSION: 1.  Questionable atherosclerotic plaque or thrombus of a left MCA M2 branch on series 2 image 10. 2.  No associated parenchymal CT findings of acute infarct identified. 3.  Mild progression of chronic white matter changes.  Study discussed by telephone with Dr. Leroy Kennedy on 10/27/2012 at 1236 hours.   Original Report Authenticated By: Erskine Speed, M.D.   Mr [facility] Wo Contrast  10/27/2012   *RADIOLOGY REPORT*  Clinical Data:  Slurred speech which is improving.  Headache. History breast cancer.  MRI BRAIN WITHOUT CONTRAST MRA HEAD WITHOUT CONTRAST  Technique: Multiplanar, multiecho pulse sequences of the brain and surrounding structures were obtained according to standard protocol without intravenous contrast.  Angiographic images of the head were obtained using MRA technique without contrast.  Comparison: 10/27/2012 head CT.  10/25/2009 brain MRI and MRA angiogram.  MRI HEAD  Findings:  No acute infarct.  T2 shine through on diffusion sequence.  Remote small right frontal lobe infarct.  Mild to moderate small vessel disease type changes.  No  intracranial hemorrhage.  Global atrophy without hydrocephalus.  No intracranial mass lesion detected on this unenhanced exam.   Cervical medullary junction, pituitary region and pineal region as well as orbital structures unremarkable.  Minimal mucosal thickening paranasal sinuses.  IMPRESSION: No acute infarct.  Please see above.  MRA HEAD  Findings: Ectatic distal vertical cervical segment of the right internal carotid artery.  Moderate stenosis cavernous segment left internal carotid artery.  Mild bulge right internal carotid artery cavernous segment felt to be related to atherosclerotic type changes rather than aneurysm.  Slightly bulbous appearance of the supraclinoid aspect of the internal carotid artery bilaterally without discrete aneurysm.  Hypoplastic A1 segment of the left anterior cerebral artery.  Mild narrowing irregularity of the M1 segment of the left middle cerebral artery.  Mild middle cerebral artery branch vessel irregularity greater on the left.  Ectatic vertebral arteries and basilar artery.  Right vertebral artery is dominant.  Slight irregularity of large left PICA.  Nonvisualization right PICA.  Minimal narrowing mid aspect of the basilar artery.  Nonvisualization left AICA.  Irregular narrowed right AICA.  Mild to moderate narrowing involving portions of the posterior cerebral artery bilaterally more notable on the right.  IMPRESSION: Intracranial atherosclerotic type changes as detailed above.   Original Report Authenticated By: Lacy Duverney, M.D.   Mr Brain Wo Contrast  10/27/2012   *RADIOLOGY REPORT*  Clinical Data:  Slurred speech which is improving.  Headache. History breast cancer.  MRI BRAIN WITHOUT CONTRAST MRA HEAD WITHOUT CONTRAST  Technique: Multiplanar, multiecho pulse sequences of the brain and surrounding structures were obtained according to standard protocol without intravenous contrast.  Angiographic images of the head were obtained using MRA technique without contrast.   Comparison: 10/27/2012 head CT.  10/25/2009 brain MRI and MRA angiogram.  MRI HEAD  Findings:  No acute infarct.  T2 shine through on diffusion sequence.  Remote small right frontal lobe infarct.  Mild to moderate small vessel disease type changes.  No intracranial hemorrhage.  Global atrophy without hydrocephalus.  No intracranial mass lesion detected on this unenhanced exam.   Cervical medullary junction, pituitary region and pineal region as well as orbital structures unremarkable.  Minimal mucosal thickening paranasal sinuses.  IMPRESSION: No acute infarct.  Please see above.  MRA HEAD  Findings: Ectatic distal vertical cervical segment of the right internal carotid artery.  Moderate stenosis cavernous segment left internal carotid artery.  Mild bulge right internal carotid artery cavernous segment felt to be related to atherosclerotic type changes rather than aneurysm.  Slightly bulbous appearance of the supraclinoid aspect of the internal carotid artery bilaterally without discrete aneurysm.  Hypoplastic A1 segment of the left anterior cerebral artery.  Mild narrowing irregularity of the M1 segment of the left middle cerebral artery.  Mild middle cerebral artery branch vessel irregularity greater on the left.  Ectatic vertebral arteries and basilar artery.  Right vertebral artery is dominant.  Slight irregularity of large left PICA.  Nonvisualization right PICA.  Minimal narrowing mid aspect of the basilar artery.  Nonvisualization left AICA.  Irregular narrowed right AICA.  Mild to moderate narrowing involving portions of the posterior cerebral artery bilaterally more notable on the right.  IMPRESSION: Intracranial atherosclerotic type changes as detailed above.   Original Report Authenticated By: Lacy Duverney, M.D.    Microbiology: No results found for this or any previous visit (from the past 240 hour(s)).   Labs: Basic Metabolic Panel:  Recent Labs Lab 10/27/12 1228 10/27/12 1230 10/27/12 1958  10/28/12 0550  NA 139 141  --  140  K 4.2 4.2  --  4.4  CL 102 104  --  102  CO2 28  --   --  28  GLUCOSE 113* 116*  --  96  BUN 16 17  --  19  CREATININE 0.61 0.70 1.04 0.76  CALCIUM 10.1  --   --  10.1   Liver Function Tests:  Recent Labs Lab 10/27/12 1228  AST 15  ALT 9  ALKPHOS 64  BILITOT 0.4  PROT 7.3  ALBUMIN 3.6   No results found for this basename: LIPASE, AMYLASE,  in the last 168  hours No results found for this basename: AMMONIA,  in the last 168 hours CBC:  Recent Labs Lab 10/27/12 1228 10/27/12 1230 10/27/12 1958 10/28/12 0550  WBC 10.8*  --  12.9* 11.4*  NEUTROABS 7.7  --   --   --   HGB 13.0 13.6 12.8 13.1  HCT 39.2 40.0 38.6 39.1  MCV 92.5  --  92.3 92.2  PLT 192  --  211 210   Cardiac Enzymes:  Recent Labs Lab 10/27/12 1229  TROPONINI <0.30   BNP: BNP (last 3 results) No results found for this basename: PROBNP,  in the last 8760 hours CBG:  Recent Labs Lab 10/27/12 1244 10/27/12 2220 10/28/12 0653 10/28/12 1122  GLUCAP 88 135* 88 170*       Signed:  Nigel Wessman  [facility] 10/28/2012, 12:37 PM

## 2012-10-28 NOTE — Progress Notes (Signed)
Pt discharged. Discharged instructions given and pt verbalized understanding.  

## 2012-10-31 NOTE — Progress Notes (Signed)
PT G-code Note   10/28/12 1004  PT G-Codes **NOT FOR INPATIENT CLASS**  Functional Assessment Tool Used clinical judgement  Functional Limitation Mobility: Walking and moving around  Mobility: Walking and Moving Around Current Status ([id number]) CH  Mobility: Walking and Moving Around Goal Status [phone]) CH  Mobility: Walking and Moving Around Discharge Status [phone]) [facility] Takiah Maiden PT [phone]

## 2012-11-01 ENCOUNTER — Ambulatory Visit (INDEPENDENT_AMBULATORY_CARE_PROVIDER_SITE_OTHER): Payer: Medicare Other | Admitting: Family Medicine

## 2012-11-01 ENCOUNTER — Encounter: Payer: Self-pay | Admitting: Family Medicine

## 2012-11-01 VITALS — BP 126/78 | HR 80 | Temp 98.7°F | Ht 62.5 in | Wt 110.8 lb

## 2012-11-01 DIAGNOSIS — G43809 Other migraine, not intractable, without status migrainosus: Secondary | ICD-10-CM | POA: Diagnosis not present

## 2012-11-01 DIAGNOSIS — G459 Transient cerebral ischemic attack, unspecified: Secondary | ICD-10-CM

## 2012-11-01 DIAGNOSIS — E119 Type 2 diabetes mellitus without complications: Secondary | ICD-10-CM

## 2012-11-01 NOTE — Assessment & Plan Note (Signed)
Unsure if /how this may cross over with TIA Will ref to neuro Worried fiorcet may potentiate falls- will go back to tylenol and update if this is not effective

## 2012-11-01 NOTE — Assessment & Plan Note (Signed)
Rev hosp records and studies in detail -incl normal carotids and echo (no TEE however)  No source of emboli  Does have signs of atherosclerosis intracranial however Small vessel dz is possible ? If rel to ha  Will ref to neurol In meantime will continue plavix -tolerating well / watching for nosebleeds

## 2012-11-01 NOTE — Progress Notes (Signed)
Subjective:    Patient ID: Lauren [name], female    DOB: 30-Oct-1927, 79 y.o.   MRN: [id number]  HPI Here for f/u of suspected TIA and migraine phenomenon 6/5- 6/6  Seen by Dr Pearlean Brownie with asa changed to plavix Held metformin due to low sugar Lab Results  Component Value Date   HGBA1C 6.3* 10/27/2012     Ct showed small plaque MCA M2 branch and white matter changes Carotid dopplers ned  MRI/ MRA showed intracranial atherosclerosis with no acute strokes  Vitals today are nl and wt is stable   Pt reports her R arm was really shaky and then she could not "form her words"  Arm never lost function  No facial droop Improved in the ER - could speak  occ still has some word searching and slowness-not slurring, however  Was also very tired   Headache continues - back and ear to ear / a vague pressure sensation - she did take the fioricet given   Patient Active Problem List   Diagnosis Date Noted  . Hypoglycemia 10/27/2012  . History of melanoma excision 09/27/2012  . History of breast cancer, left 01/08/2011  . Pelvic fracture 09/17/2010  . DEGENERATIVE JOINT DISEASE 02/13/2010  . MIGRAINE VARIANT 11/06/2009  . OSTEOPENIA 10/18/2008  . NEOPLASM, MALIGNANT, BREAST. left, T1b, N0, lumpectomy 10/25/2007. 09/10/2008  . TIA 09/10/2008  . DIABETES MELLITUS, TYPE II 10/06/2007  . VENOUS INSUFFICIENCY 10/06/2007  . DIVERTICULOSIS, COLON 10/06/2007  . FIBROCYSTIC BREAST DISEASE 10/06/2007  . HYPERCHOLESTEROLEMIA, PURE 11/29/2006   Past Medical History  Diagnosis Date  . Diabetes mellitus   . Gout     injection in the past  . Fibrocystic breast   . Venous insufficiency   . Hyperlipidemia   . TIA (transient ischemic attack) 08/2008    neg MRI/MRA and CT and carotid dopplers  . Chronic constipation   . Gastritis   . Chronic headaches   . Pelvic fracture   . Breast cancer     left breast   Past Surgical History  Procedure Laterality Date  . Breast cyst aspiration    . Eye  surgery      retinal, then cataract  . Breast lumpectomy  09/2007    with sentinel LN biopsy  . Appendectomy    . Breast lumpectomy      left   History  Substance Use Topics  . Smoking status: Never Smoker   . Smokeless tobacco: Never Used  . Alcohol Use: No   Family History  Problem Relation Age of Onset  . Breast cancer Sister   . Stroke Sister   . Cancer Sister     breast  . Breast cancer Sister   . Stroke Sister   . Cancer Sister     breast  . Stroke Mother   . Stroke Father   . Stroke Brother   . Cancer Brother     prostate  . Stroke Brother   . Stroke Sister    Allergies  Allergen Reactions  . Aspirin     REACTION: GI  . Sulfonamide Derivatives     REACTION: itching   Current Outpatient Prescriptions on File Prior to Visit  Medication Sig Dispense Refill  . acetaminophen (TYLENOL) 500 MG tablet Take 500 mg by mouth every 6 (six) hours as needed.        . butalbital-acetaminophen-caffeine (FIORICET, ESGIC) 50-325-40 MG per tablet Take 1 tablet by mouth every 4 (four) hours as needed for headache.  5 tablet  0  . Calcium-Vitamin D 600-200 MG-UNIT per tablet Take 1 tablet by mouth 2 (two) times daily.       . citalopram (CELEXA) 10 MG tablet Take 1 tablet (10 mg total) by mouth daily.  30 tablet  11  . clopidogrel (PLAVIX) 75 MG tablet Take 1 tablet (75 mg total) by mouth daily with breakfast.  30 tablet  0  . fish oil-omega-3 fatty acids 1000 MG capsule Take 1 g by mouth daily.        Marland Kitchen letrozole (FEMARA) 2.5 MG tablet Take 1 tablet (2.5 mg total) by mouth daily.  30 tablet  5  . loratadine (CLARITIN) 10 MG tablet Take 1 tablet (10 mg total) by mouth daily.      Marland Kitchen omeprazole (PRILOSEC) 40 MG capsule Take 40 mg by mouth daily.        . polyethylene glycol (MIRALAX / GLYCOLAX) packet Take 17 g by mouth as needed.  14 each  6  . Probiotic Product (PROBIOTIC DAILY PO) Take 1 tablet by mouth daily.      . simvastatin (ZOCOR) 20 MG tablet Take 1 tablet (20 mg total)  by mouth at bedtime.  30 tablet  11   No current facility-administered medications on file prior to visit.     Review of Systems Review of Systems  Constitutional: Negative for fever, appetite change, fatigue and unexpected weight change.  ENt pos for occas nosebleed Eyes: Negative for pain and visual disturbance.  Respiratory: Negative for cough and shortness of breath.   Cardiovascular: Negative for cp or palpitations    Gastrointestinal: Negative for nausea, diarrhea and constipation.  Genitourinary: Negative for urgency and frequency.  Skin: Negative for pallor or rash   Neurological: Negative for weakness, light-headedness, numbness and pos for headaches and slow speech Pos for generally poor balance Hematological: Negative for adenopathy. Does not bruise/bleed easily.  Psychiatric/Behavioral: Negative for dysphoric mood. The patient is somewhat anxious          Objective:   Physical Exam  Constitutional: She appears well-developed and well-nourished. No distress.  Frail appearing elderly female  HENT:  Head: Normocephalic and atraumatic.  Right Ear: External ear normal.  Left Ear: External ear normal.  Mouth/Throat: Oropharynx is clear and moist.  Eyes: EOM are normal. Pupils are equal, round, and reactive to light. Right eye exhibits no discharge. Left eye exhibits no discharge. No scleral icterus.  Some injection of lower eyelids   Neck: Normal range of motion. Neck supple. No JVD present. Carotid bruit is not present. No thyromegaly present.  Cardiovascular: Normal rate, regular rhythm, normal heart sounds and intact distal pulses.  Exam reveals no gallop.   Pulmonary/Chest: Effort normal and breath sounds normal. No respiratory distress. She has no wheezes. She exhibits no tenderness.  Abdominal: Soft. Bowel sounds are normal. She exhibits no distension, no abdominal bruit and no mass. There is no tenderness.  Musculoskeletal: She exhibits no edema and no tenderness.   Lymphadenopathy:    She has no cervical adenopathy.  Neurological: She is alert. She has normal reflexes. She displays tremor (right hand- mild ). She displays no atrophy. No cranial nerve deficit or sensory deficit. She exhibits normal muscle tone. Gait abnormal. Coordination normal.  Gait is wide based and mildly unsteady  Skin: Skin is warm and dry. No rash noted. No erythema. No pallor.  Psychiatric: She has a normal mood and affect.          Assessment &  Plan:

## 2012-11-01 NOTE — Assessment & Plan Note (Signed)
Pt had some sugar readings in hosp in 80s -while eating well Lab Results  Component Value Date   HGBA1C 6.3* 10/27/2012    Will stay off of this and re check 3 mo  Will check sugar at home 1-2 times weekly

## 2012-11-01 NOTE — Patient Instructions (Addendum)
Continue to hold metformin and check blood sugar once in a while  Eat regular meals and drink fluids See Shirlee Limerick at check out for neurology referral  Update me if symptoms change Follow up with me in 3 months with labs prior

## 2012-11-17 ENCOUNTER — Encounter: Payer: Self-pay | Admitting: Neurology

## 2012-11-17 ENCOUNTER — Ambulatory Visit (INDEPENDENT_AMBULATORY_CARE_PROVIDER_SITE_OTHER): Payer: Medicare Other | Admitting: Neurology

## 2012-11-17 VITALS — BP 165/79 | HR 87 | Temp 97.3°F | Ht 62.5 in | Wt 112.0 lb

## 2012-11-17 DIAGNOSIS — G43909 Migraine, unspecified, not intractable, without status migrainosus: Secondary | ICD-10-CM

## 2012-11-17 DIAGNOSIS — G250 Essential tremor: Secondary | ICD-10-CM | POA: Diagnosis not present

## 2012-11-17 DIAGNOSIS — R6889 Other general symptoms and signs: Secondary | ICD-10-CM

## 2012-11-17 DIAGNOSIS — G252 Other specified forms of tremor: Secondary | ICD-10-CM | POA: Diagnosis not present

## 2012-11-17 NOTE — Patient Instructions (Addendum)
I do want to suggest a few things today:  Remember to drink plenty of fluid, eat healthy meals and do not skip any meals. Try to eat protein with a every meal and eat a healthy snack such as fruit or nuts in between meals. Try to keep a regular sleep-wake schedule and try to exercise daily, particularly in the form of walking, 20-30 minutes a day, if you can.   Engage in social activities in your community and with your family and try to keep up with current events by reading the newspaper or watching the news.   As far as your medications are concerned, I would like to suggest no new medication.    As far as diagnostic testing: EEG (brain wave test).  I would like to see you back in 3 months, sooner if we need to. Please call us with any interim questions, concerns, problems, updates or refill requests. Pls also schedule Follow up with Dr. Pearlean Brownie  Please also call us for any test results so we can go over those with you on the phone. Brett Canales is my clinical assistant and will answer any of your questions and relay your messages to me and also relay most of my messages to you.  Our phone number is [phone]. We also have an after hours call service for urgent matters and there is a physician on-call for urgent questions. For any emergencies you know to call 911 or go to the nearest emergency room.

## 2012-11-17 NOTE — Progress Notes (Signed)
Subjective:    Lauren [name] ID: Lauren Lauren [name] is a 79 y.o. female.  HPI  Lauren Foley, MD, PhD [facility] [address], [address] [address] [place], [place] [address]  Dear Dr. Milinda [name],  I saw your Lauren [name], Lauren Lauren [name], upon your kind request in my neurologic clinic today for initial consultation of her migraine headaches as well as a history of TIA. Lauren Lauren [name] is accompanied by her nieces today. As you know, Lauren Lauren [name] is a very pleasant 79 year old right-handed woman with an underlying medical history of type 2 diabetes, History of melanoma, history of breast cancer, pelvic fracture, osteopenia, TIA, venous insufficiency, diverticulosis, hyperlipidemia who had a TIA like spell earlier this month and was seen in hospital by Dr. Pearlean Lauren [name] who changed her aspirin to Plavix and held her metformin due to low blood sugar values. She reports being in her normal state of health during Lauren late moring on 10/27/12 when she had sudden onset of R hand and arm shaking, which she not control, and she could not get her words out. Her sister's caregiver called 911. She had full stroke w/u: Her CT showed a small plaque in Lauren MCA M2 branch and white matter changes, she had negative carotid Doppler studies, there was no acute stroke on MRI brain and she had intracranial atherosclerosis on MRA head. Her blood work was unremarkable, with normal lipids, A1c of 6.3. She had transient neurological symptoms including difficulty forming her words and right arm shakiness, no facial droop. She has a long-standing history of migraine headaches, but also c/o a bandlike sensation in Lauren front. For her headaches she takes tylenol. She was given neurontin by a HA specialist in Lauren past, but it was not helpful.  Since she was discharged she has had more hand tremor and intermittent zoning out at time, no frank convulsions. She has had a hand tremor for years. Her 2 nephews have tremors.  Her current medications are  furosemide as needed, calcium, vitamin D, Celexa, Plavix, fish oil, Claritin, Prilosec, MiraLax as needed, probiotic, Zocor, Femara.    Her Past Medical History Is Significant For: Past Medical History  Diagnosis Date  . Diabetes mellitus   . Gout     injection in Lauren past  . Fibrocystic breast   . Venous insufficiency   . Hyperlipidemia   . TIA (transient ischemic attack) 08/2008    neg MRI/MRA and CT and carotid dopplers  . Chronic constipation   . Gastritis   . Chronic headaches   . Pelvic fracture   . Breast cancer     left breast    Her Past Surgical History Is Significant For: Past Surgical History  Procedure Laterality Date  . Breast cyst aspiration    . Eye surgery      retinal, then cataract  . Breast lumpectomy  09/2007    with sentinel LN biopsy  . Appendectomy    . Breast lumpectomy      left    Her Family History Is Significant For: Family History  Problem Relation Age of Onset  . Breast cancer Sister   . Stroke Sister   . Cancer Sister     breast  . Breast cancer Sister   . Stroke Sister   . Cancer Sister     breast  . Stroke Mother   . Stroke Father   . Stroke Brother   . Cancer Brother     prostate  . Stroke Brother   . Stroke  Sister     Her Social History Is Significant For: History   Social History  . Marital Status: Single    Spouse Name: N/A    Number of Children: N/A  . Years of Education: N/A   Social History Main Topics  . Smoking status: Never Smoker   . Smokeless tobacco: Never Used  . Alcohol Use: No  . Drug Use: No  . Sexually Active: Not Currently   Other Topics Concern  . None   Social History Narrative   Lives with and cares for both sisters   Strong faith    Her Allergies Are:  Allergies  Allergen Reactions  . Aspirin     REACTION: GI  . Sulfonamide Derivatives     REACTION: itching  :   Her Current Medications Are:  Outpatient Encounter Prescriptions as of 11/17/2012  Medication Sig Dispense  Refill  . acetaminophen (TYLENOL) 500 MG tablet Take 500 mg by mouth every 6 (six) hours as needed.        . butalbital-acetaminophen-caffeine (FIORICET, ESGIC) 50-325-40 MG per tablet Take 1 tablet by mouth every 4 (four) hours as needed for headache.  5 tablet  0  . Calcium-Vitamin D 600-200 MG-UNIT per tablet Take 1 tablet by mouth 2 (two) times daily.       . citalopram (CELEXA) 10 MG tablet Take 1 tablet (10 mg total) by mouth daily.  30 tablet  11  . clopidogrel (PLAVIX) 75 MG tablet Take 1 tablet (75 mg total) by mouth daily with breakfast.  30 tablet  0  . fish oil-omega-3 fatty acids 1000 MG capsule Take 1 g by mouth daily.        Marland Kitchen loratadine (CLARITIN) 10 MG tablet Take 1 tablet (10 mg total) by mouth daily.      Marland Kitchen omeprazole (PRILOSEC) 40 MG capsule Take 40 mg by mouth daily.        . polyethylene glycol (MIRALAX / GLYCOLAX) packet Take 17 g by mouth as needed.  14 each  6  . Probiotic Product (PROBIOTIC DAILY PO) Take 1 tablet by mouth daily.      . simvastatin (ZOCOR) 20 MG tablet Take 1 tablet (20 mg total) by mouth at bedtime.  30 tablet  11  . [DISCONTINUED] letrozole (FEMARA) 2.5 MG tablet Take 1 tablet (2.5 mg total) by mouth daily.  30 tablet  5   No facility-administered encounter medications on file as of 11/17/2012.    Review of Systems  Constitutional: Positive for fatigue.  HENT: Positive for rhinorrhea and trouble swallowing.   Respiratory: Positive for shortness of breath.   Allergic/Immunologic: Positive for environmental allergies.  Neurological: Positive for tremors (Right arm), seizures, speech difficulty, weakness and headaches.       Memory loss  Hematological: Bruises/bleeds easily.  Psychiatric/Behavioral: Positive for dysphoric mood. Lauren Lauren [name] is nervous/anxious.        Sleepiness    Objective:  Neurologic Exam  Physical Exam Physical Examination:   Filed Vitals:   11/17/12 1406  BP: 165/79  Pulse: 87  Temp: 97.3 F (36.3 C)    General  Examination: Lauren Lauren [name] is a very pleasant 79 y.o. female in no acute distress. She appears well-developed and well-nourished and well groomed. She is mildly anxious appearing.  HEENT: Normocephalic, atraumatic, pupils are equal, round and reactive to light and accommodation. Extraocular tracking is good without limitation to gaze excursion or nystagmus noted. Normal smooth pursuit is noted. Hearing is grossly intact. Tympanic membranes are  clear bilaterally. Face is symmetric with normal facial animation and normal facial sensation. Speech is clear with no dysarthria noted. There is no hypophonia. There is no lip, neck/head, jaw or voice tremor. Neck is supple with full range of passive and active motion. There are no carotid bruits on auscultation. Oropharynx exam reveals: mild mouth dryness, adequate dental hygiene and mild airway crowding. Mallampati is class II. Tongue protrudes centrally and palate elevates symmetrically.   Chest: Clear to auscultation without wheezing, rhonchi or crackles noted.  Heart: S1+S2+0, regular and normal without murmurs, rubs or gallops noted.   Abdomen: Soft, non-tender and non-distended with normal bowel sounds appreciated on auscultation.  Extremities: There is no pitting edema in Lauren distal lower extremities bilaterally. Pedal pulses are intact.  Skin: Warm and dry without trophic changes noted. There are no varicose veins.  Musculoskeletal: exam reveals no obvious joint deformities, tenderness or joint swelling or erythema.   Neurologically:  Mental status: Lauren Lauren [name] is awake, alert and oriented in all 4 spheres. Her memory, attention, language and knowledge are appropriate. There is no aphasia, agnosia, apraxia or anomia. Speech is clear with normal prosody and enunciation. Thought process is linear. Mood is congruent and affect is normal.  Cranial nerves are as described above under HEENT exam. In addition, shoulder shrug is normal with equal shoulder  height noted. Motor exam: Normal bulk, strength and tone is noted. There is no drift, or rebound. She has a mild intermittent resting tremor on Lauren R hand and a mild bilateral intermittent postural and action tremor bilaterally, R more than L. Romberg is negative. Reflexes are 2+ throughout. Fine motor skills are minimally impaired bilaterally as far as finger taps, normal hand movements, normal rapid alternating patting, normal foot taps and normal foot agility.  Cerebellar testing shows no dysmetria or intention tremor on finger to nose testing. There is no truncal or gait ataxia.  Sensory exam is intact to light touch, pinprick, vibration, temperature sense and proprioception in Lauren upper and lower extremities.  Gait, station and balance: She stands up with mild difficulty and her posture is mildly stooped. She walks cautiously and turns in 3 steps. She has a mild decrease in arm swing bilaterally.   Assessment and Plan:   In summary, Lauren Lauren [name] is a very pleasant 79 y.o.-year old female with a history of headaches, tremors, and intermittent episodes of decreased attentiveness and difficulty with her speech. Her headaches may be a combination of tension-type and migraine headaches. Of note her niece does point out she has a lot of stress, in particular since she is living with her 2 sisters who both have dementia. Her nieces are close by. I would like to proceed with an EEG. She has essential tremor affecting mostly her right hand with resting tremor component but absence of any other parkinsonian signs. I continued her on all her medications and explain secondary stroke prevention to them. She is advised to also schedule followup with my colleague Dr. Pearlean Lauren [name] regarding her TIA symptomatology. I will see her back in about 3 months. We will be calling her with her test results. Thank you very much for allowing me to participate in Lauren care of this nice Lauren [name]. If I can be of any further assistance to  you please do not hesitate to call me at [phone].  Sincerely,   Lauren Foley, MD, PhD

## 2012-11-23 ENCOUNTER — Other Ambulatory Visit: Payer: Self-pay | Admitting: *Deleted

## 2012-11-23 MED ORDER — CLOPIDOGREL BISULFATE 75 MG PO TABS: 75.0000 mg | ORAL_TABLET | Freq: Every day | ORAL | Status: DC

## 2012-11-23 NOTE — Telephone Encounter (Signed)
Pt was prescribed rx while in hospital, will you authorize refills?

## 2012-11-23 NOTE — Telephone Encounter (Signed)
Yes- please give her 6 mo of refills

## 2012-12-01 ENCOUNTER — Other Ambulatory Visit: Payer: Self-pay

## 2012-12-06 ENCOUNTER — Ambulatory Visit (INDEPENDENT_AMBULATORY_CARE_PROVIDER_SITE_OTHER): Payer: Medicare Other

## 2012-12-06 DIAGNOSIS — G250 Essential tremor: Secondary | ICD-10-CM | POA: Diagnosis not present

## 2012-12-06 DIAGNOSIS — R6889 Other general symptoms and signs: Secondary | ICD-10-CM

## 2012-12-06 DIAGNOSIS — G43909 Migraine, unspecified, not intractable, without status migrainosus: Secondary | ICD-10-CM

## 2012-12-06 DIAGNOSIS — G252 Other specified forms of tremor: Secondary | ICD-10-CM | POA: Diagnosis not present

## 2012-12-09 ENCOUNTER — Telehealth: Payer: Self-pay | Admitting: Neurology

## 2012-12-09 NOTE — Telephone Encounter (Signed)
Things for reading the EEG, but when I clipped on the link below it says patient name Lauren [name].Marland KitchenMarland Kitchen

## 2012-12-09 NOTE — Telephone Encounter (Signed)
[facility]  EEG (ELECTROENCEPHALOGRAM) REPORT   STUDY DATE: 11-07-12  PATIENT NAME: Lauren [name]  DOB: [birth year] MRN: [id number] EEG  ORDERING CLINICIAN: Towanda Malkin   Interpreted by Dr. Porfirio Mylar Rolondo Pierre   TECHNOLOGIST: L.Jones  TECHNIQUE: Electroencephalogram was recorded utilizing standard 10-20 system of lead placement and reformatted into average and bipolar montages.  RECORDING TIME: 30.5 minutes  ACTIVATION:  HV and strobe lights.   CLINICAL INFORMATION: Patient of Dr. Johny Sax presented with a complaint of spells of  Speech arrest, right arm trembling, followed by headaches.  FINDINGS: Background rhythms of10 hertz . No focal, lateralizing, epileptiform activity or seizures are seen. Patient recorded in the awake, drowsy and asleep  state. EKG channel shows mostly  NSR with some additional arrhythmic  beats,  .  Reviewed the  EKG at epoch  6 minutes 45 sec and at 10 minutes and 24 seconds.  Photic stimulation led to excessive eye blink artifact. Hyperventilation led to amplitude built up.   IMPRESSION: normal EEG    Melvyn Novas , MD 12-08-12

## 2012-12-09 NOTE — Telephone Encounter (Signed)
Pt is calling about her test results would like for someone to call her back concerning this matter.

## 2012-12-12 NOTE — Telephone Encounter (Signed)
Patient requesting EEG test results.

## 2012-12-14 ENCOUNTER — Telehealth: Payer: Self-pay | Admitting: Neurology

## 2012-12-14 NOTE — Procedures (Signed)
HISTORY: 79 years old female, with episode of headaches, intermittent confusion,  TECHNIQUE:  16 channel EEG was performed based on standard 10-16 international system. One channel was dedicated to EKG, which has demonstrates normal sinus rhythm of beats 78 per minutes.  Upon awakening, the posterior background activity was well-developed, in alpha range, 9 Hz, with amplitude of 50 microvoltage, reactive to eye opening and closure.  There was no evidence of epilepsy for discharge.  Photic stimulation was performed, which induced a symmetric photic driving.  Hyperventilation was not performed  No sleep was achieved.  CONCLUSION: This is a  normal awake EEG.  There is no electrodiagnostic evidence of epileptiform discharge

## 2012-12-14 NOTE — Telephone Encounter (Signed)
Gave patient EEG results.

## 2012-12-15 NOTE — Progress Notes (Signed)
Quick Note:  Please call and advise the patient that the EEG or brain wave test we performed was reported as normal in the awake state. We checked for abnormal electrical brain wave activity. No further action is required on this test at this time. Please remind patient to keep any upcoming appointments or tests and to call us with any interim questions, concerns, problems or updates. Thanks,  Huston Foley, MD, PhD    ______

## 2012-12-19 NOTE — Progress Notes (Signed)
Quick Note:  Spoke with patient and relayed the results of their EEG as well as Dr Athar's advise or recommendations. The patient was also reminded of any future appointments. The patient understood and had no questions.  ______ 

## 2012-12-22 ENCOUNTER — Ambulatory Visit
Admission: RE | Admit: 2012-12-22 | Discharge: 2012-12-22 | Disposition: A | Discharge status: Home / Self Care | Payer: Medicare Other | Source: Ambulatory Visit | Attending: Adult Health | Admitting: Adult Health

## 2012-12-22 DIAGNOSIS — Z9889 Other specified postprocedural states: Secondary | ICD-10-CM

## 2012-12-22 DIAGNOSIS — Z853 Personal history of malignant neoplasm of breast: Secondary | ICD-10-CM | POA: Diagnosis not present

## 2012-12-22 DIAGNOSIS — R928 Other abnormal and inconclusive findings on diagnostic imaging of breast: Secondary | ICD-10-CM | POA: Diagnosis not present

## 2012-12-28 ENCOUNTER — Encounter (HOSPITAL_COMMUNITY): Payer: Self-pay

## 2012-12-28 ENCOUNTER — Emergency Department (HOSPITAL_COMMUNITY)
Admission: EM | Admit: 2012-12-28 | Discharge: 2012-12-28 | Disposition: A | Discharge status: Home / Self Care | Payer: Medicare Other | Attending: Emergency Medicine | Admitting: Emergency Medicine

## 2012-12-28 ENCOUNTER — Emergency Department (HOSPITAL_COMMUNITY): Payer: Medicare Other

## 2012-12-28 DIAGNOSIS — Z8679 Personal history of other diseases of the circulatory system: Secondary | ICD-10-CM | POA: Diagnosis not present

## 2012-12-28 DIAGNOSIS — Z853 Personal history of malignant neoplasm of breast: Secondary | ICD-10-CM | POA: Insufficient documentation

## 2012-12-28 DIAGNOSIS — E119 Type 2 diabetes mellitus without complications: Secondary | ICD-10-CM | POA: Insufficient documentation

## 2012-12-28 DIAGNOSIS — G20A1 Parkinson's disease without dyskinesia, without mention of fluctuations: Secondary | ICD-10-CM | POA: Diagnosis not present

## 2012-12-28 DIAGNOSIS — G459 Transient cerebral ischemic attack, unspecified: Secondary | ICD-10-CM | POA: Diagnosis not present

## 2012-12-28 DIAGNOSIS — R6889 Other general symptoms and signs: Secondary | ICD-10-CM | POA: Diagnosis not present

## 2012-12-28 DIAGNOSIS — R51 Headache: Secondary | ICD-10-CM | POA: Diagnosis not present

## 2012-12-28 DIAGNOSIS — G20 Parkinson's disease: Secondary | ICD-10-CM | POA: Diagnosis not present

## 2012-12-28 DIAGNOSIS — Z79899 Other long term (current) drug therapy: Secondary | ICD-10-CM | POA: Insufficient documentation

## 2012-12-28 DIAGNOSIS — J019 Acute sinusitis, unspecified: Secondary | ICD-10-CM | POA: Diagnosis not present

## 2012-12-28 DIAGNOSIS — J329 Chronic sinusitis, unspecified: Secondary | ICD-10-CM | POA: Diagnosis not present

## 2012-12-28 DIAGNOSIS — R259 Unspecified abnormal involuntary movements: Secondary | ICD-10-CM | POA: Diagnosis not present

## 2012-12-28 DIAGNOSIS — Z8719 Personal history of other diseases of the digestive system: Secondary | ICD-10-CM | POA: Insufficient documentation

## 2012-12-28 DIAGNOSIS — Z8673 Personal history of transient ischemic attack (TIA), and cerebral infarction without residual deficits: Secondary | ICD-10-CM | POA: Diagnosis not present

## 2012-12-28 DIAGNOSIS — Z8639 Personal history of other endocrine, nutritional and metabolic disease: Secondary | ICD-10-CM | POA: Insufficient documentation

## 2012-12-28 DIAGNOSIS — Z862 Personal history of diseases of the blood and blood-forming organs and certain disorders involving the immune mechanism: Secondary | ICD-10-CM | POA: Diagnosis not present

## 2012-12-28 DIAGNOSIS — E785 Hyperlipidemia, unspecified: Secondary | ICD-10-CM | POA: Insufficient documentation

## 2012-12-28 DIAGNOSIS — Z8781 Personal history of (healed) traumatic fracture: Secondary | ICD-10-CM | POA: Diagnosis not present

## 2012-12-28 LAB — POCT I-STAT, CHEM 8
Calcium, Ion: 1.26 mmol/L (ref 1.13–1.30)
Creatinine, Ser: 0.9 mg/dL (ref 0.50–1.10)
Hemoglobin: 14.3 g/dL (ref 12.0–15.0)
Sodium: 142 mEq/L (ref 135–145)
TCO2: 27 mmol/L (ref 0–100)

## 2012-12-28 LAB — URINALYSIS, ROUTINE W REFLEX MICROSCOPIC
Leukocytes, UA: NEGATIVE
Nitrite: NEGATIVE
Specific Gravity, Urine: 1.008 (ref 1.005–1.030)
pH: 7 (ref 5.0–8.0)

## 2012-12-28 LAB — PROTIME-INR: INR: 0.97 (ref 0.00–1.49)

## 2012-12-28 MED ORDER — AMOXICILLIN-POT CLAVULANATE 250-125 MG PO TABS: 1.0000 | ORAL_TABLET | Freq: Three times a day (TID) | ORAL | Status: DC

## 2012-12-28 NOTE — ED Notes (Signed)
Per [facility], pt woke up at 0900 this morning and started having tremors and stuttering shortly afterwards and tightness in her head. Reports same symptoms in June which was evaluated as a TIA. Gait normal and stroke scale negative per EMS. VSS. Started on Plavix in June.

## 2012-12-28 NOTE — ED Notes (Signed)
Phlebotomy at the bedside  

## 2012-12-28 NOTE — ED Provider Notes (Signed)
CSN: [id number]     Arrival date & time 12/28/12  1025 History     First MD Initiated Contact with Patient 12/28/12 1037     Chief Complaint  Patient presents with  . Tremors    HPI Per [facility], pt woke up at 0900 this morning and started having tremors and stuttering shortly afterwards and tightness in her head. Reports same symptoms in June which was evaluated as a TIA. Gait normal and stroke scale negative per EMS. VSS. Started on Plavix in June.  Past Medical History  Diagnosis Date  . Diabetes mellitus   . Gout     injection in the past  . Fibrocystic breast   . Venous insufficiency   . Hyperlipidemia   . TIA (transient ischemic attack) 08/2008    neg MRI/MRA and CT and carotid dopplers  . Chronic constipation   . Gastritis   . Chronic headaches   . Pelvic fracture   . Breast cancer     left breast   Past Surgical History  Procedure Laterality Date  . Breast cyst aspiration    . Eye surgery      retinal, then cataract  . Breast lumpectomy  09/2007    with sentinel LN biopsy  . Appendectomy    . Breast lumpectomy      left   Family History  Problem Relation Age of Onset  . Breast cancer Sister   . Stroke Sister   . Cancer Sister     breast  . Breast cancer Sister   . Stroke Sister   . Cancer Sister     breast  . Stroke Mother   . Stroke Father   . Stroke Brother   . Cancer Brother     prostate  . Stroke Brother   . Stroke Sister    History  Substance Use Topics  . Smoking status: Never Smoker   . Smokeless tobacco: Never Used  . Alcohol Use: No   OB History   Grav Para Term Preterm Abortions TAB SAB Ect Mult Living                 Review of Systems All other systems reviewed and are Allergies  Aspirin and Sulfonamide derivatives  Home Medications   Current Outpatient Rx  Name  Route  Sig  Dispense  Refill  . acetaminophen (TYLENOL) 500 MG tablet   Oral   Take 500 mg by mouth every 6 (six) hours as needed for pain.          .  Calcium-Vitamin D 600-200 MG-UNIT per tablet   Oral   Take 1 tablet by mouth 2 (two) times daily.          . citalopram (CELEXA) 10 MG tablet   Oral   Take 10 mg by mouth daily.         . clopidogrel (PLAVIX) 75 MG tablet   Oral   Take 75 mg by mouth every morning.         . fish oil-omega-3 fatty acids 1000 MG capsule   Oral   Take 1 g by mouth daily.           Marland Kitchen loratadine (CLARITIN) 10 MG tablet   Oral   Take 10 mg by mouth daily.         . Multiple Vitamins-Minerals (PRESERVISION AREDS PO)   Oral   Take 1 tablet by mouth daily.         Marland Kitchen omeprazole (  PRILOSEC) 40 MG capsule   Oral   Take 40 mg by mouth daily.           Bertram Gala Glycol-Propyl Glycol (SYSTANE OP)   Ophthalmic   Apply 1 drop to eye 4 (four) times daily as needed (allergy eyes).         . polyethylene glycol (MIRALAX / GLYCOLAX) packet   Oral   Take 17 g by mouth as needed.   14 each   6   . polyethylene glycol (MIRALAX / GLYCOLAX) packet   Oral   Take 17 g by mouth daily as needed (constipation).         . Probiotic Product (PROBIOTIC DAILY PO)   Oral   Take 1 tablet by mouth daily.         . simvastatin (ZOCOR) 20 MG tablet   Oral   Take 20 mg by mouth every evening.         Marland Kitchen amoxicillin-clavulanate (AUGMENTIN) 250-125 MG per tablet   Oral   Take 1 tablet by mouth 3 (three) times daily.   21 tablet   0    BP 145/78  Pulse 70  Temp(Src) 98.2 F (36.8 C) (Oral)  Resp 17  Ht 5\' 3"  (1.6 m)  Wt 111 lb (50.349 kg)  BMI 19.67 kg/m2  SpO2 96% Physical Exam  Nursing note and vitals reviewed. Constitutional: She is oriented to person, place, and time. She appears well-developed and well-nourished. No distress.  HENT:  Head: Normocephalic and atraumatic.  Eyes: Pupils are equal, round, and reactive to light.  Neck: Normal range of motion.  Cardiovascular: Normal rate and intact distal pulses.   Pulmonary/Chest: No respiratory distress.  Abdominal: Normal  appearance. She exhibits no distension.  Musculoskeletal: Normal range of motion.  Neurological: She is alert and oriented to person, place, and time. No cranial nerve deficit. GCS eye subscore is 4. GCS verbal subscore is 5. GCS motor subscore is 6.  Resting tremor noted most prominent in right upper extremity.  No cogwheeling or rigidity noted.  Good pulses.  Skin: Skin is warm and dry. No rash noted.  Psychiatric: She has a normal mood and affect. Her behavior is normal.    ED Course   Date: 12/28/2012  Rate: 66  Rhythm: normal sinus rhythm  QRS Axis: normal  Intervals: normal  ST/T Wave abnormalities: normal  Conduction Disutrbances: none  Narrative Interpretation: unremarkable      Procedures (including critical care time)  Labs Reviewed  POCT I-STAT, CHEM 8 - Abnormal; Notable for the following:    Glucose, Bld 171 (*)    All other components within normal limits  URINALYSIS, ROUTINE W REFLEX MICROSCOPIC  PROTIME-INR   Results for orders placed during the hospital encounter of 12/28/12  URINALYSIS, ROUTINE W REFLEX MICROSCOPIC      Result Value Range   Color, Urine YELLOW  YELLOW   APPearance CLEAR  CLEAR   Specific Gravity, Urine 1.008  1.005 - 1.030   pH 7.0  5.0 - 8.0   Glucose, UA NEGATIVE  NEGATIVE mg/dL   Hgb urine dipstick NEGATIVE  NEGATIVE   Bilirubin Urine NEGATIVE  NEGATIVE   Ketones, ur NEGATIVE  NEGATIVE mg/dL   Protein, ur NEGATIVE  NEGATIVE mg/dL   Urobilinogen, UA 0.2  0.0 - 1.0 mg/dL   Nitrite NEGATIVE  NEGATIVE   Leukocytes, UA NEGATIVE  NEGATIVE  PROTIME-INR      Result Value Range   Prothrombin Time 12.7  11.6 -  15.2 seconds   INR 0.97  0.00 - 1.49  POCT I-STAT, CHEM 8      Result Value Range   Sodium 142  135 - 145 mEq/L   Potassium 3.9  3.5 - 5.1 mEq/L   Chloride 103  96 - 112 mEq/L   BUN 19  6 - 23 mg/dL   Creatinine, Ser 1.61  0.50 - 1.10 mg/dL   Glucose, Bld 096 (*) 70 - 99 mg/dL   Calcium, Ion 0.45  4.09 - 1.30 mmol/L   TCO2 27   0 - 100 mmol/L   Hemoglobin 14.3  12.0 - 15.0 g/dL   HCT 81.1  91.4 - 78.2 %   Ct Head Wo Contrast  12/28/2012   *RADIOLOGY REPORT*  Clinical Data: Head pain.  CT HEAD WITHOUT CONTRAST  Technique:  Contiguous axial images were obtained from the base of the skull through the vertex without contrast.  Comparison: Head CT 10/27/2012.  Findings: Mild cerebral atrophy.  Extensive patchy and confluent areas of decreased attenuation throughout the deep and periventricular white matter of the cerebral hemispheres bilaterally, compatible with chronic microvascular ischemic disease. No acute intracranial abnormalities.  Specifically, no evidence of acute intracranial hemorrhage, no definite findings of acute/subacute cerebral ischemia, no mass, mass effect, hydrocephalus or abnormal intra or extra-axial fluid collections. Visualized paranasal sinuses and mastoids are well pneumatized, with exception of a extensive mucosal thickening and some frothy secretions which nearly completely opacified the right sphenoid sinus.  No acute displaced skull fractures are identified.  IMPRESSION: 1.  No acute intracranial abnormalities. 2.  However, there is near complete opacification of the right sphenoid sinus.  Clinical correlation for signs and symptoms of acute sinusitis may be warranted. 3.  Mild cerebral atrophy with extensive chronic microvascular ischemic changes throughout the deep and periventricular white matter of the cerebral hemispheres bilaterally.   Original Report Authenticated By: Trudie Reed, M.D.       No results found. 1. Sinusitis   2. Parkinson's disease (tremor, stiffness, slow motion, unstable posture)     MDM    Nelia Shi, MD 01/02/13 [phone]

## 2012-12-28 NOTE — ED Notes (Signed)
Dr. Beaton at the bedside. 

## 2013-01-09 ENCOUNTER — Telehealth: Payer: Self-pay

## 2013-01-09 MED ORDER — ZOSTER VACCINE LIVE 19400 UNT/0.65ML ~~LOC~~ SOLR: 0.6500 mL | Freq: Once | SUBCUTANEOUS | Status: DC

## 2013-01-09 NOTE — Telephone Encounter (Signed)
Px printed for pick up in IN box  

## 2013-01-09 NOTE — Telephone Encounter (Signed)
Pt left v/m requesting shingles prescription; call pt when rx ready for pick up. Pt checked with insurance co and is not sure which pharmacy she will go to.

## 2013-01-10 NOTE — Telephone Encounter (Signed)
Pt notified Rx ready for pickup 

## 2013-01-11 ENCOUNTER — Telehealth: Payer: Self-pay | Admitting: Family Medicine

## 2013-01-11 ENCOUNTER — Observation Stay (HOSPITAL_COMMUNITY)
Admission: EM | Admit: 2013-01-11 | Discharge: 2013-01-12 | Disposition: A | Discharge status: Home / Self Care | Payer: Medicare Other | Attending: Internal Medicine | Admitting: Internal Medicine

## 2013-01-11 ENCOUNTER — Emergency Department (HOSPITAL_COMMUNITY): Payer: Medicare Other

## 2013-01-11 ENCOUNTER — Other Ambulatory Visit: Payer: Self-pay | Admitting: Family Medicine

## 2013-01-11 ENCOUNTER — Encounter (HOSPITAL_COMMUNITY): Payer: Self-pay | Admitting: Emergency Medicine

## 2013-01-11 DIAGNOSIS — M899 Disorder of bone, unspecified: Secondary | ICD-10-CM

## 2013-01-11 DIAGNOSIS — D72829 Elevated white blood cell count, unspecified: Secondary | ICD-10-CM

## 2013-01-11 DIAGNOSIS — C50919 Malignant neoplasm of unspecified site of unspecified female breast: Secondary | ICD-10-CM

## 2013-01-11 DIAGNOSIS — Z9889 Other specified postprocedural states: Secondary | ICD-10-CM | POA: Diagnosis not present

## 2013-01-11 DIAGNOSIS — Z85820 Personal history of malignant melanoma of skin: Secondary | ICD-10-CM

## 2013-01-11 DIAGNOSIS — I059 Rheumatic mitral valve disease, unspecified: Secondary | ICD-10-CM

## 2013-01-11 DIAGNOSIS — Z853 Personal history of malignant neoplasm of breast: Secondary | ICD-10-CM | POA: Diagnosis not present

## 2013-01-11 DIAGNOSIS — E7800 Pure hypercholesterolemia, unspecified: Secondary | ICD-10-CM

## 2013-01-11 DIAGNOSIS — Z8673 Personal history of transient ischemic attack (TIA), and cerebral infarction without residual deficits: Secondary | ICD-10-CM | POA: Diagnosis not present

## 2013-01-11 DIAGNOSIS — Z79899 Other long term (current) drug therapy: Secondary | ICD-10-CM | POA: Diagnosis not present

## 2013-01-11 DIAGNOSIS — E1142 Type 2 diabetes mellitus with diabetic polyneuropathy: Secondary | ICD-10-CM | POA: Diagnosis present

## 2013-01-11 DIAGNOSIS — R0789 Other chest pain: Secondary | ICD-10-CM | POA: Diagnosis not present

## 2013-01-11 DIAGNOSIS — G459 Transient cerebral ischemic attack, unspecified: Secondary | ICD-10-CM | POA: Diagnosis not present

## 2013-01-11 DIAGNOSIS — R4181 Age-related cognitive decline: Secondary | ICD-10-CM | POA: Insufficient documentation

## 2013-01-11 DIAGNOSIS — Z7902 Long term (current) use of antithrombotics/antiplatelets: Secondary | ICD-10-CM | POA: Diagnosis not present

## 2013-01-11 DIAGNOSIS — R072 Precordial pain: Secondary | ICD-10-CM | POA: Diagnosis not present

## 2013-01-11 DIAGNOSIS — E119 Type 2 diabetes mellitus without complications: Secondary | ICD-10-CM

## 2013-01-11 DIAGNOSIS — E1169 Type 2 diabetes mellitus with other specified complication: Secondary | ICD-10-CM | POA: Diagnosis present

## 2013-01-11 DIAGNOSIS — R079 Chest pain, unspecified: Secondary | ICD-10-CM | POA: Diagnosis present

## 2013-01-11 DIAGNOSIS — I872 Venous insufficiency (chronic) (peripheral): Secondary | ICD-10-CM

## 2013-01-11 DIAGNOSIS — G43809 Other migraine, not intractable, without status migrainosus: Secondary | ICD-10-CM

## 2013-01-11 DIAGNOSIS — M1990 Unspecified osteoarthritis, unspecified site: Secondary | ICD-10-CM

## 2013-01-11 DIAGNOSIS — K5730 Diverticulosis of large intestine without perforation or abscess without bleeding: Secondary | ICD-10-CM

## 2013-01-11 DIAGNOSIS — E162 Hypoglycemia, unspecified: Secondary | ICD-10-CM

## 2013-01-11 HISTORY — DX: Melanoma in situ of left upper limb, including shoulder: D03.62

## 2013-01-11 LAB — COMPREHENSIVE METABOLIC PANEL
ALT: 15 U/L (ref 0–35)
AST: 18 U/L (ref 0–37)
Albumin: 3.3 g/dL — ABNORMAL LOW (ref 3.5–5.2)
Alkaline Phosphatase: 67 U/L (ref 39–117)
Calcium: 9.9 mg/dL (ref 8.4–10.5)
Glucose, Bld: 101 mg/dL — ABNORMAL HIGH (ref 70–99)
Potassium: 4.3 mEq/L (ref 3.5–5.1)
Sodium: 138 mEq/L (ref 135–145)
Total Protein: 6.7 g/dL (ref 6.0–8.3)

## 2013-01-11 LAB — CBC WITH DIFFERENTIAL/PLATELET
Basophils Absolute: 0.1 10*3/uL (ref 0.0–0.1)
Basophils Relative: 1 % (ref 0–1)
Eosinophils Absolute: 0.3 10*3/uL (ref 0.0–0.7)
Lymphs Abs: 2.1 10*3/uL (ref 0.7–4.0)
MCH: 31.7 pg (ref 26.0–34.0)
Neutrophils Relative %: 74 % (ref 43–77)
Platelets: 204 10*3/uL (ref 150–400)
RBC: 4.07 MIL/uL (ref 3.87–5.11)
RDW: 14.1 % (ref 11.5–15.5)

## 2013-01-11 LAB — CREATININE, SERUM
Creatinine, Ser: 0.7 mg/dL (ref 0.50–1.10)
GFR calc Af Amer: >90 mL/min (ref >90)

## 2013-01-11 LAB — CBC
Hemoglobin: 12.9 g/dL (ref 12.0–15.0)
MCH: 31.7 pg (ref 26.0–34.0)
Platelets: 210 10*3/uL (ref 150–400)
RBC: 4.07 MIL/uL (ref 3.87–5.11)
WBC: 12.8 10*3/uL — ABNORMAL HIGH (ref 4.0–10.5)

## 2013-01-11 LAB — GLUCOSE, CAPILLARY

## 2013-01-11 LAB — TROPONIN I: Troponin I: <0.3 ng/mL (ref <0.30)

## 2013-01-11 LAB — URINALYSIS, ROUTINE W REFLEX MICROSCOPIC
Bilirubin Urine: NEGATIVE
Glucose, UA: NEGATIVE mg/dL
Hgb urine dipstick: NEGATIVE
Protein, ur: NEGATIVE mg/dL
Specific Gravity, Urine: 1.014 (ref 1.005–1.030)
Urobilinogen, UA: 0.2 mg/dL (ref 0.0–1.0)

## 2013-01-11 MED ORDER — INSULIN ASPART 100 UNIT/ML ~~LOC~~ SOLN
0.0000 [IU] | Freq: Three times a day (TID) | SUBCUTANEOUS | Status: DC
  Administered 2013-01-12: 2 [IU] via SUBCUTANEOUS

## 2013-01-11 MED ORDER — CLOPIDOGREL BISULFATE 75 MG PO TABS
75.0000 mg | ORAL_TABLET | Freq: Every day | ORAL | Status: DC
  Administered 2013-01-12: 75 mg via ORAL
  Filled 2013-01-11: qty 1

## 2013-01-11 MED ORDER — ONDANSETRON HCL 4 MG/2ML IJ SOLN: 4.0000 mg | Freq: Four times a day (QID) | INTRAMUSCULAR | Status: DC | PRN

## 2013-01-11 MED ORDER — SODIUM CHLORIDE 0.9 % IJ SOLN
3.0000 mL | Freq: Two times a day (BID) | INTRAMUSCULAR | Status: DC
  Administered 2013-01-11 – 2013-01-12 (×2): 3 mL via INTRAVENOUS

## 2013-01-11 MED ORDER — ALBUTEROL SULFATE (5 MG/ML) 0.5% IN NEBU: 2.5000 mg | INHALATION_SOLUTION | RESPIRATORY_TRACT | Status: DC | PRN

## 2013-01-11 MED ORDER — POLYETHYLENE GLYCOL 3350 17 G PO PACK: 17.0000 g | PACK | Freq: Every day | ORAL | Status: DC | PRN

## 2013-01-11 MED ORDER — GUAIFENESIN-DM 100-10 MG/5ML PO SYRP: 5.0000 mL | ORAL_SOLUTION | ORAL | Status: DC | PRN

## 2013-01-11 MED ORDER — POLYETHYLENE GLYCOL 3350 17 G PO PACK
17.0000 g | PACK | Freq: Every day | ORAL | Status: DC | PRN
Start: 2013-01-11 — End: 2013-01-12
  Filled 2013-01-11: qty 1

## 2013-01-11 MED ORDER — ONDANSETRON HCL 4 MG PO TABS: 4.0000 mg | ORAL_TABLET | Freq: Four times a day (QID) | ORAL | Status: DC | PRN

## 2013-01-11 MED ORDER — NITROGLYCERIN 2 % TD OINT
0.5000 [in_us] | TOPICAL_OINTMENT | Freq: Four times a day (QID) | TRANSDERMAL | Status: DC
  Administered 2013-01-11 – 2013-01-12 (×4): 0.5 [in_us] via TOPICAL
  Filled 2013-01-11 (×29): qty 30

## 2013-01-11 MED ORDER — ALUM & MAG HYDROXIDE-SIMETH 200-200-20 MG/5ML PO SUSP: 30.0000 mL | Freq: Four times a day (QID) | ORAL | Status: DC | PRN

## 2013-01-11 MED ORDER — PANTOPRAZOLE SODIUM 40 MG PO TBEC
40.0000 mg | DELAYED_RELEASE_TABLET | Freq: Every day | ORAL | Status: DC
  Administered 2013-01-12: 40 mg via ORAL
  Filled 2013-01-11: qty 1

## 2013-01-11 MED ORDER — HYDROCODONE-ACETAMINOPHEN 5-325 MG PO TABS: 1.0000 | ORAL_TABLET | ORAL | Status: DC | PRN

## 2013-01-11 MED ORDER — HEPARIN SODIUM (PORCINE) 5000 UNIT/ML IJ SOLN
5000.0000 [IU] | Freq: Three times a day (TID) | INTRAMUSCULAR | Status: DC
  Administered 2013-01-11 – 2013-01-12 (×3): 5000 [IU] via SUBCUTANEOUS
  Filled 2013-01-11 (×5): qty 1

## 2013-01-11 MED ORDER — ZOSTER VACCINE LIVE 19400 UNT/0.65ML ~~LOC~~ SOLR: 0.6500 mL | Freq: Once | SUBCUTANEOUS | Status: DC

## 2013-01-11 MED ORDER — SIMVASTATIN 20 MG PO TABS
20.0000 mg | ORAL_TABLET | Freq: Every evening | ORAL | Status: DC
  Administered 2013-01-11: 20 mg via ORAL
  Filled 2013-01-11 (×2): qty 1

## 2013-01-11 MED ORDER — CITALOPRAM HYDROBROMIDE 10 MG PO TABS
10.0000 mg | ORAL_TABLET | Freq: Every day | ORAL | Status: DC
  Administered 2013-01-11 – 2013-01-12 (×2): 10 mg via ORAL
  Filled 2013-01-11 (×3): qty 1

## 2013-01-11 NOTE — ED Notes (Signed)
Per EMS upon waking up pt had some substernal CP, non radiating, having some nausea. intialy rated pain 5/10. Upon ems arrival 3/10. EMS started a 20G in right arm, administered 1nitro, then pain decreased to 1/10. EMS originally BP 143/68 after nitro 114/52. HR 73 NSR. Pt in nad, skin warm and dry, resp e/u.

## 2013-01-11 NOTE — Consult Note (Signed)
Patient ID: Lauren [name] MRN: [id number] DOB/AGE: 02-Mar-1928 79 y.o.  Admit date: 01/11/2013 Referring Physician: Thedore Mins Primary Cardiologist: New Reason for Consultation: Chest pain  HPI: 79 yo female with history of DM, HLD, TIA on chronic Plavix, gastritis, breast cancer admitted with chest pain. Troponin negative x 1. EKG without ischemic changes. Chest pain resolved. She describes waking up with mild chest pressure in epigastric area this am. Pain resolved and had breakfast. Recurrence of mild pain after breakfast. No prior cardiac disease. No associated SOB, diaphoresis but she did not feel right. Feels much better now.    Past Medical History  Diagnosis Date  . Diabetes mellitus   . Gout     injection in the past  . Fibrocystic breast   . Venous insufficiency   . Hyperlipidemia   . TIA (transient ischemic attack) 08/2008    neg MRI/MRA and CT and carotid dopplers  . Chronic constipation   . Gastritis   . Chronic headaches   . Pelvic fracture   . Breast cancer     left breast  . Melanoma in situ of left upper extremity     Family History  Problem Relation Age of Onset  . Breast cancer Sister   . Stroke Sister   . Cancer Sister     breast  . Breast cancer Sister   . Stroke Sister   . Cancer Sister     breast  . Stroke Mother   . Stroke Father   . Stroke Brother   . Cancer Brother     prostate  . Stroke Brother   . Stroke Sister   . CAD Sister     History   Social History  . Marital Status: Single    Spouse Name: N/A    Number of Children: N/A  . Years of Education: N/A   Occupational History  . Not on file.   Social History Main Topics  . Smoking status: Never Smoker   . Smokeless tobacco: Never Used  . Alcohol Use: No  . Drug Use: No  . Sexual Activity: Not Currently   Other Topics Concern  . Not on file   Social History Narrative   Lives with and cares for both sisters   Strong faith    Past Surgical History  Procedure Laterality  Date  . Breast cyst aspiration    . Eye surgery      retinal, then cataract  . Breast lumpectomy  09/2007    with sentinel LN biopsy  . Appendectomy    . Breast lumpectomy      left    Allergies  Allergen Reactions  . Aspirin     REACTION: GI  . Sulfonamide Derivatives     REACTION: itching    Prescriptions prior to admission  Medication Sig Dispense Refill  . acetaminophen (TYLENOL) 500 MG tablet Take 500 mg by mouth every 6 (six) hours as needed for pain.       . Calcium-Vitamin D 600-200 MG-UNIT per tablet Take 1 tablet by mouth 2 (two) times daily.       . citalopram (CELEXA) 10 MG tablet Take 10 mg by mouth daily.      . clopidogrel (PLAVIX) 75 MG tablet Take 75 mg by mouth every morning.      . fish oil-omega-3 fatty acids 1000 MG capsule Take 1 g by mouth daily.        Marland Kitchen ketotifen (ZADITOR) 0.025 % ophthalmic solution Place 1 drop  into both eyes 2 (two) times daily as needed (eye itching).      Marland Kitchen loratadine (CLARITIN) 10 MG tablet Take 10 mg by mouth daily.      . Multiple Vitamins-Minerals (PRESERVISION AREDS PO) Take 1 tablet by mouth daily.      Marland Kitchen omeprazole (PRILOSEC) 40 MG capsule Take 40 mg by mouth daily.        . polyethylene glycol (MIRALAX / GLYCOLAX) packet Take 17 g by mouth daily as needed (constipation).      . Probiotic Product (PROBIOTIC DAILY PO) Take 1 tablet by mouth daily.      . simvastatin (ZOCOR) 20 MG tablet Take 20 mg by mouth every evening.       Hospital Medications:  . citalopram  10 mg Oral Daily  . [START ON 01/12/2013] clopidogrel  75 mg Oral Q breakfast  . heparin  5,000 Units Subcutaneous Q8H  . [START ON 01/12/2013] insulin aspart  0-9 Units Subcutaneous TID WC  . nitroGLYCERIN  0.5 inch Topical Q6H  . pantoprazole  40 mg Oral Daily  . simvastatin  20 mg Oral QPM  . sodium chloride  3 mL Intravenous Q12H   Review of systems complete and found to be negative unless listed above   Physical Exam: Blood pressure 117/70, pulse 66,  temperature 98.3 F (36.8 C), temperature source Oral, resp. rate 17, SpO2 96.00%.General: Well developed, well nourished, NAD  HEENT: OP clear, mucus membranes moist  SKIN: warm, dry. No rashes.  Neuro: No focal deficits  Musculoskeletal: Muscle strength 5/5 all ext  Psychiatric: Mood and affect normal  Neck: No JVD, no carotid bruits, no thyromegaly, no lymphadenopathy.  Lungs:Clear bilaterally, no wheezes, rhonci, crackles  Cardiovascular: Regular rate and rhythm. No murmurs, gallops or rubs.  Abdomen:Soft. Bowel sounds present. Non-tender.  Extremities: No lower extremity edema. Pulses are 2 + in the bilateral DP/PT.  Labs:   Lab Results  Component Value Date   WBC 12.7* 01/11/2013   HGB 12.9 01/11/2013   HCT 37.3 01/11/2013   MCV 91.6 01/11/2013   PLT 204 01/11/2013     Recent Labs Lab 01/11/13 1430  NA 138  K 4.3  CL 103  CO2 24  BUN 22  CREATININE 0.66  CALCIUM 9.9  PROT 6.7  BILITOT 0.3  ALKPHOS 67  ALT 15  AST 18  GLUCOSE 101*   Lab Results  Component Value Date   CKTOTAL 28 10/25/2009   CKMB 1.2 10/25/2009   TROPONINI <0.30 01/11/2013    Chest x-ray:  Findings: Heart size remains at the upper limits of normal. Mild  scarring again noted in the lingula. No evidence of pulmonary  infiltrate or edema. No evidence of pleural effusion. No mass or  lymphadenopathy identified.  IMPRESSION:  No active cardiopulmonary disease.  EKG: sinus, no ischemic changes.   Echo: Normal LVEF, moderate MR, mild TR.   ASSESSMENT AND PLAN:   1. Chest pain: Her cardiac risk factors are advanced age, DM, HLD. She has no objective evidence of ischemia. She has had no symptoms of unstable angina before this am. Cannot exclude cardiac etiology of chest pain. Echo without wall motion abnormalities. She has normal LVEF. Agree with cycling cardiac markers tonight. NPO at midnight. If troponins negative in am, plan Lexiscan stress myoview tomorrow. If she rules in for MI, would plan  cardiac cath. Continue PPI for possible GI related etiology.    Signed: Ronda Kazmi 01/11/2013, 5:41 PM

## 2013-01-11 NOTE — H&P (Signed)
[facility]                                                                                    Patient Demographics  Lauren [name], is a 79 y.o. female  CSN: [id number]  MRN: [id number]  DOB - May 05, 1928  Admit Date - 01/11/2013  Outpatient Primary MD for the patient is Roxy Manns, MD   With History of -  Past Medical History  Diagnosis Date  . Diabetes mellitus   . Gout     injection in the past  . Fibrocystic breast   . Venous insufficiency   . Hyperlipidemia   . TIA (transient ischemic attack) 08/2008    neg MRI/MRA and CT and carotid dopplers  . Chronic constipation   . Gastritis   . Chronic headaches   . Pelvic fracture   . Breast cancer     left breast  . Melanoma in situ of left upper extremity       Past Surgical History  Procedure Laterality Date  . Breast cyst aspiration    . Eye surgery      retinal, then cataract  . Breast lumpectomy  09/2007    with sentinel LN biopsy  . Appendectomy    . Breast lumpectomy      left    in for   Chief Complaint  Patient presents with  . Chest Pain     HPI  Lauren [name]  is a 79 y.o. female, History of left-sided breast cancer with lumpectomy, left arm melanoma post excision, recent sinus infection finished antibiotics, diabetes mellitus type 2, dyslipidemia, TIA, comes to the ER after she started experiencing substernal pressure-like sensation early this morning when she woke up, pain has been intermittent, no aggravating factors relieved by nitroglycerin, no associated symptoms except mild nausea but she experienced when she came to the ER, no association to exertion, no pleuritic nature, in the ER she received one pill of sublingual nitroglycerin with relief, she is now completely chest pain-free, she does feel weak overall but besides that she has currently no other subjective complaints.She denies any recent long travels, Denies any personal of family history of blood clots. She does not have any  shortness of breath now or before.  In the ER initial EKG, chest x-ray and lab work unremarkable except for mild nonspecific leukocytosis, first set of troponin is negative and I was called to admit the patient.    Review of Systems    In addition to the HPI above,   No Fever-chills, No Headache, No changes with Vision or hearing, No problems swallowing food or Liquids, No Chest pain, Cough or Shortness of Breath, No Abdominal pain, No Nausea or Vommitting, Bowel movements are regular, No Blood in stool or Urine, No dysuria, No new skin rashes or bruises, No new joints pains-aches,  No new weakness, tingling, numbness in any extremity, No recent weight gain or loss, No polyuria, polydypsia or polyphagia, No significant Mental Stressors.  A full 10 point Review of Systems was done, except as stated above, all other Review of Systems were negative.   Social History History  Substance Use Topics  . Smoking  status: Never Smoker   . Smokeless tobacco: Never Used  . Alcohol Use: No      Family History Family History  Problem Relation Age of Onset  . Breast cancer Sister   . Stroke Sister   . Cancer Sister     breast  . Breast cancer Sister   . Stroke Sister   . Cancer Sister     breast  . Stroke Mother   . Stroke Father   . Stroke Brother   . Cancer Brother     prostate  . Stroke Brother   . Stroke Sister       Prior to Admission medications   Medication Sig Start Date End Date Taking? Authorizing Provider  acetaminophen (TYLENOL) 500 MG tablet Take 500 mg by mouth every 6 (six) hours as needed for pain.    Yes Historical Provider, MD  Calcium-Vitamin D 600-200 MG-UNIT per tablet Take 1 tablet by mouth 2 (two) times daily.    Yes Historical Provider, MD  citalopram (CELEXA) 10 MG tablet Take 10 mg by mouth daily.   Yes Historical Provider, MD  clopidogrel (PLAVIX) 75 MG tablet Take 75 mg by mouth every morning.   Yes Historical Provider, MD  fish oil-omega-3  fatty acids 1000 MG capsule Take 1 g by mouth daily.     Yes Historical Provider, MD  ketotifen (ZADITOR) 0.025 % ophthalmic solution Place 1 drop into both eyes 2 (two) times daily as needed (eye itching).   Yes Historical Provider, MD  loratadine (CLARITIN) 10 MG tablet Take 10 mg by mouth daily.   Yes Historical Provider, MD  Multiple Vitamins-Minerals (PRESERVISION AREDS PO) Take 1 tablet by mouth daily.   Yes Historical Provider, MD  omeprazole (PRILOSEC) 40 MG capsule Take 40 mg by mouth daily.     Yes Historical Provider, MD  polyethylene glycol (MIRALAX / GLYCOLAX) packet Take 17 g by mouth daily as needed (constipation).   Yes Historical Provider, MD  Probiotic Product (PROBIOTIC DAILY PO) Take 1 tablet by mouth daily.   Yes Historical Provider, MD  simvastatin (ZOCOR) 20 MG tablet Take 20 mg by mouth every evening.   Yes Historical Provider, MD    Allergies  Allergen Reactions  . Aspirin     REACTION: GI  . Sulfonamide Derivatives     REACTION: itching    Physical Exam  Vitals  Blood pressure 114/60, pulse 71, temperature 98.3 F (36.8 C), temperature source Oral, resp. rate 17, SpO2 92.00%.   1. General elderly white female lying in bed in NAD,     2. Normal affect and insight, Not Suicidal or Homicidal, Awake Alert, Oriented X 3.  3. No F.N deficits, ALL C.Nerves Intact, Strength 5/5 all 4 extremities, Sensation intact all 4 extremities, Plantars down going.  4. Ears and Eyes appear Normal, Conjunctivae clear, PERRLA. Moist Oral Mucosa.  5. Supple Neck, No JVD, No cervical lymphadenopathy appriciated, No Carotid Bruits.  6. Symmetrical Chest wall movement, Good air movement bilaterally, CTAB.  7. RRR, No Gallops, Rubs or Murmurs, No Parasternal Heave.  8. Positive Bowel Sounds, Abdomen Soft, Non tender, No organomegaly appriciated,No rebound -guarding or rigidity.  9.  No Cyanosis, Normal Skin Turgor, No Skin Rash or Bruise.  10. Good muscle tone,  joints  appear normal , no effusions, Normal ROM.  11. No Palpable Lymph Nodes in Neck or Axillae     Data Review  CBC  Recent Labs Lab 01/11/13 1430  WBC 12.7*  HGB 12.9  HCT 37.3  PLT 204  MCV 91.6  MCH 31.7  MCHC 34.6  RDW 14.1  LYMPHSABS 2.1  MONOABS 0.9  EOSABS 0.3  BASOSABS 0.1   ------------------------------------------------------------------------------------------------------------------  Chemistries   Recent Labs Lab 01/11/13 1430  NA 138  K 4.3  CL 103  CO2 24  GLUCOSE 101*  BUN 22  CREATININE 0.66  CALCIUM 9.9  AST 18  ALT 15  ALKPHOS 67  BILITOT 0.3   ------------------------------------------------------------------------------------------------------------------ CrCl is unknown because both a height and weight (above a minimum accepted value) are required for this calculation. ------------------------------------------------------------------------------------------------------------------ No results found for this basename: TSH, T4TOTAL, FREET3, T3FREE, THYROIDAB,  in the last 72 hours   Coagulation profile No results found for this basename: INR, PROTIME,  in the last 168 hours ------------------------------------------------------------------------------------------------------------------- No results found for this basename: DDIMER,  in the last 72 hours -------------------------------------------------------------------------------------------------------------------  Cardiac Enzymes  Recent Labs Lab 01/11/13 1430  TROPONINI <0.30   ------------------------------------------------------------------------------------------------------------------ No components found with this basename: POCBNP,    ---------------------------------------------------------------------------------------------------------------  Urinalysis    Component Value Date/Time   COLORURINE YELLOW 01/11/2013 1526   APPEARANCEUR CLEAR 01/11/2013 1526   LABSPEC  1.014 01/11/2013 1526   PHURINE 6.5 01/11/2013 1526   GLUCOSEU NEGATIVE 01/11/2013 1526   HGBUR NEGATIVE 01/11/2013 1526   BILIRUBINUR NEGATIVE 01/11/2013 1526   KETONESUR NEGATIVE 01/11/2013 1526   PROTEINUR NEGATIVE 01/11/2013 1526   UROBILINOGEN 0.2 01/11/2013 1526   NITRITE NEGATIVE 01/11/2013 1526   LEUKOCYTESUR NEGATIVE 01/11/2013 1526    ----------------------------------------------------------------------------------------------------------------  Imaging results:   Dg Chest 2 View  01/11/2013   *RADIOLOGY REPORT*  Clinical Data: Chest pain radiating to right arm.  Hypertension. Diabetes.  CHEST - 2 VIEW  Comparison:  08/06/2010  Findings:  Heart size remains at the upper limits of normal.  Mild scarring again noted in the lingula.  No evidence of pulmonary infiltrate or edema.  No evidence of pleural effusion.  No mass or lymphadenopathy identified.  IMPRESSION: No active cardiopulmonary disease.   Original Report Authenticated By: Myles Rosenthal, M.D.       My personal review of EKG: Rhythm NSR,   no Acute ST changes    Assessment & Plan   1.Atypical chest pain a patient with risk factors for CAD and CAD equivalent. She will be kept on telemetry, 20 3R office, cycle troponins, check echo for wall motion, she is allergic to aspirin so Plavix will be continued along with statin for secondary prevention,We'll apply nitro paste as she had pain relief with nitroglycerin,[place] cardiology has been requested to see the patient to decide whether she needs any further testing.   2.History of diabetes mellitus type 2.D. On diet control, check A1c, low carb diet, sliding scale insulin before every meal.   3. History of left breast cancer post surgery, left arm melanoma post excision. Outpatient followup with PCP and oncologist post discharge. No acute issues.   4.History of dyslipidemia. Continue home dose statin.    5. History of TIA. Continue Plavix and statin for secondary prevention.  No acute issue.    6. Mild nonspecific leukocytosis. Afebrile, chest x-ray clear, will obtain UA.     DVT Prophylaxis Heparin    AM Labs Ordered, also please review Full Orders  Family Communication: Admission, patients condition and plan of care including tests being ordered have been discussed with the patient and sister who indicate understanding and agree with the plan and Code Status.  Code Status Full  Likely DC to  Home  Time spent in minutes : 35  Condition Lauren [name] K M.D on 01/11/2013 at 4:24 PM  Between 7am to 7pm - Pager - [phone]  After 7pm go to [email or web address] - password [email or web address]  And look for the night coverage person covering me after hours  [facility] Office  [phone]

## 2013-01-11 NOTE — ED Notes (Signed)
Pt reports she took 4 baby ASA this morning. Pt now c/o nausea and HA, reports the HA started after she received the Nitro. Pt sts she is still having a very dull pain in substernal chest, rating pain at 1/10. Pt in nad, skin warm and dry, resp e/u.

## 2013-01-11 NOTE — ED Provider Notes (Signed)
CSN: [id number]     Arrival date & time 01/11/13  1355 History     First MD Initiated Contact with Patient 01/11/13 1358     Chief Complaint  Patient presents with  . Chest Pain   (Consider location/radiation/quality/duration/timing/severity/associated sxs/prior Treatment) HPI 79 y o w f with PMH- DM2, hyperlipidemia, breast Ca left, -2009 with partial mastectomy, melanoma LUE- surgery earlier this year, TIA, pelvic fracture, presented today with c/o chest pain, center of her chest, started this morning when she woke up in the morning about 7 hrs ago, described as tightness, non radiating, not exertional, assoc with nausea and dry heaves, with mild dizziness but no falls, with diaphoresis, relieved by NTG, mild aggrav by lying down. No previous episodes of chest pain, has had reflux in the past, well controlled on omeprazole, but pt describes this pain as distinctly different. No hx of leg swelling, no cough, no fever, doesn't smoke cigarettes or take alcohol or use drugs. Pt currently on clopidogrel and simvastatin-20mg  daily.   Past Medical History  Diagnosis Date  . Diabetes mellitus   . Gout     injection in the past  . Fibrocystic breast   . Venous insufficiency   . Hyperlipidemia   . TIA (transient ischemic attack) 08/2008    neg MRI/MRA and CT and carotid dopplers  . Chronic constipation   . Gastritis   . Chronic headaches   . Pelvic fracture   . Breast cancer     left breast  . Melanoma in situ of left upper extremity    Past Surgical History  Procedure Laterality Date  . Breast cyst aspiration    . Eye surgery      retinal, then cataract  . Breast lumpectomy  09/2007    with sentinel LN biopsy  . Appendectomy    . Breast lumpectomy      left   Family History  Problem Relation Age of Onset  . Breast cancer Sister   . Stroke Sister   . Cancer Sister     breast  . Breast cancer Sister   . Stroke Sister   . Cancer Sister     breast  . Stroke Mother   .  Stroke Father   . Stroke Brother   . Cancer Brother     prostate  . Stroke Brother   . Stroke Sister    History  Substance Use Topics  . Smoking status: Never Smoker   . Smokeless tobacco: Never Used  . Alcohol Use: No   OB History   Grav Para Term Preterm Abortions TAB SAB Ect Mult Living                 Review of Systems  HEAD- No Headache or seizures. EYES- No change in vision. Mouth/throat- No Sorethroat or bleeding gums. RESPIRATORY- No Cough or orthopnea. GI- No Dysphagia, nausea, vomiting, No diarrhoea, constipation, abd pain or jaundice. URINARY- No Frequency or dysuria. NEUROLOGIC- Complaints of some trembling in RUE, has been present for a while, at least over a year.  Allergies  Aspirin and Sulfonamide derivatives  Home Medications   Current Outpatient Rx  Name  Route  Sig  Dispense  Refill  . acetaminophen (TYLENOL) 500 MG tablet   Oral   Take 500 mg by mouth every 6 (six) hours as needed for pain.          . Calcium-Vitamin D 600-200 MG-UNIT per tablet   Oral   Take 1 tablet  by mouth 2 (two) times daily.          . citalopram (CELEXA) 10 MG tablet   Oral   Take 10 mg by mouth daily.         . clopidogrel (PLAVIX) 75 MG tablet   Oral   Take 75 mg by mouth every morning.         . fish oil-omega-3 fatty acids 1000 MG capsule   Oral   Take 1 g by mouth daily.           Marland Kitchen ketotifen (ZADITOR) 0.025 % ophthalmic solution   Both Eyes   Place 1 drop into both eyes 2 (two) times daily as needed (eye itching).         Marland Kitchen loratadine (CLARITIN) 10 MG tablet   Oral   Take 10 mg by mouth daily.         . Multiple Vitamins-Minerals (PRESERVISION AREDS PO)   Oral   Take 1 tablet by mouth daily.         Marland Kitchen omeprazole (PRILOSEC) 40 MG capsule   Oral   Take 40 mg by mouth daily.           . polyethylene glycol (MIRALAX / GLYCOLAX) packet   Oral   Take 17 g by mouth daily as needed (constipation).         . Probiotic Product  (PROBIOTIC DAILY PO)   Oral   Take 1 tablet by mouth daily.         . simvastatin (ZOCOR) 20 MG tablet   Oral   Take 20 mg by mouth every evening.          BP 114/60  Pulse 71  Temp(Src) 98.3 F (36.8 C) (Oral)  Resp 17  SpO2 92% Physical Exam GENERAL- alert, pleasant, co-operative, appears as stated age, not in any distress. HEENT- Atraumatic, normocephalic, PERRL, EOMI, oral mucosa moist. No carotid bruit, no cervical LN enlargement, thyroid does not appear enlarged, CARDIAC- RRR, no murmurs, rubs or gallops, chest pain not reproducible on palpation. RESP- Moving equal volumes of air, and clear to auscultation bilaterally. ABDOMEN- Soft,non tender, no palpable masses or organomegaly, bowel sounds present. BACK- Normal curvature of the spine, No tenderness along the vertebrae, no CVA tenderness. NEURO- Cr N 2-12 intact.  EXTREMITIES- pulse 2+, symmetric- all extremities.Marland Kitchen SKIN- Warm, dry, multiple flesh colored nodules around her neck. PSYCH- Normal mood and affect, appropriate thought content and speech.  ED Course   Procedures (including critical care time)  EKG- rate-75 , regular, sinus rhythm, no ST elevations or depression, no T-wave inversion, QRS- <0.12s, No axis deviation.  Labs Reviewed  CBC WITH DIFFERENTIAL - Abnormal; Notable for the following:    WBC 12.7 (*)    Neutro Abs 9.3 (*)    All other components within normal limits  COMPREHENSIVE METABOLIC PANEL - Abnormal; Notable for the following:    Glucose, Bld 101 (*)    Albumin 3.3 (*)    GFR calc non Af Amer 79 (*)    All other components within normal limits  LIPASE, BLOOD  TROPONIN I  URINALYSIS, ROUTINE W REFLEX MICROSCOPIC  TROPONIN I  TROPONIN I  TROPONIN I  URINALYSIS W MICROSCOPIC + REFLEX CULTURE   Dg Chest 2 View  01/11/2013   *RADIOLOGY REPORT*  Clinical Data: Chest pain radiating to right arm.  Hypertension. Diabetes.  CHEST - 2 VIEW  Comparison:  08/06/2010  Findings:  Heart size  remains at the upper  limits of normal.  Mild scarring again noted in the lingula.  No evidence of pulmonary infiltrate or edema.  No evidence of pleural effusion.  No mass or lymphadenopathy identified.  IMPRESSION: No active cardiopulmonary disease.   Original Report Authenticated By: Myles Rosenthal, M.D.   1. Chest pain   2. History of breast cancer   3. History of melanoma excision   4. Pure hypercholesterolemia     MDM  Pt has significant risk factors for CAD, and will be R/o. Troponins X 1 negative, EKG- No concerning changes. Also concerns for Pulmonary embolism- slightly higher risk, Hx of breast Ca in 2009 and surgery for melanoma- Lt shoulder earlier this year, SP02- 93 %. CBC- shows mildly elevated WBC- 12.7, BMP- no concerning abnormalities. Admit to internal medicine service for Chest pain R/o MI, and continued management.   Kennis Carina, MD 01/11/13 1621

## 2013-01-11 NOTE — ED Provider Notes (Signed)
Medical screening examination/treatment/procedure(s) were conducted as a shared visit with non-physician practitioner(s) and myself.  I personally evaluated the patient during the encounter  Substernal chest pain with nausea at rest.  No radiation.  Better with lying down. Resolved with nitro.  EKG nonischemic. CTAB, RRR. Chest pain not reproducible. Typical and atypical features for ACS.  Glynn Octave, MD 01/11/13 [phone]

## 2013-01-11 NOTE — ED Notes (Signed)
Echo at bedside

## 2013-01-11 NOTE — Progress Notes (Signed)
*  PRELIMINARY RESULTS* Echocardiogram 2D Echocardiogram has been performed.  Lauren [name] 01/11/2013, 4:59 PM

## 2013-01-11 NOTE — Telephone Encounter (Signed)
Agree with advisement for 911 Will be on the look out for hospital notes

## 2013-01-11 NOTE — Telephone Encounter (Signed)
Patient Information:  Caller Name: Bonita Quin  Phone: [phone]  Patient: Auriel, Kist  Gender: Female  DOB: August 15, 1927  Age: 79 Years  PCP: Roxy Manns [facility])  Office Follow Up:  Does the office need to follow up with this patient?: No  Instructions For The Office: N/A   Symptoms  Reason For Call & Symptoms: Linda/ POA  called regarding rapid heartbeat 93-95.  Denies dizziness.  She has "trouble talking".  States she was told if this happens again to have her lie down and rest since she has history of TIA's.  Caller not with patient.  Contacted France Ravens, CNA.  She relates patient has nausea, chest pain, feels clammy.  Oxygen level is 95, pulse 62; .BP 174/ 148 during call and patient reported to be very pale.    Other emergent symptoms ruled out.  Call EMS  911 Now  per Chest Pain protocol due to Chest pain lasting longer than 5 minutes and any of the following:  Over 68 years old..." . Caregivers agreed.  Reviewed Health History In EMR: Yes  Reviewed Medications In EMR: Yes  Reviewed Allergies In EMR: Yes  Reviewed Surgeries / Procedures: Yes  Date of Onset of Symptoms: 01/11/2013  Guideline(s) Used:  Chest Pain  Disposition Per Guideline:   Call EMS 911 Now  Reason For Disposition Reached:   Chest pain lasting longer than 5 minutes and ANY of the following:  Over 58 years old Over 20 years old and at least one cardiac risk factor (i.e., high blood pressure, diabetes, high cholesterol, obesity, smoker or strong family history of heart disease) Pain is crushing, pressure-like, or heavy  Took nitroglycerin and chest pain was not relieved History of heart disease (i.e., angina, heart attack, bypass surgery, angioplasty, CHF)  Advice Given:  N/A  Patient Will Follow Care Advice:  YES

## 2013-01-12 ENCOUNTER — Observation Stay (HOSPITAL_COMMUNITY): Payer: Medicare Other

## 2013-01-12 DIAGNOSIS — D72829 Elevated white blood cell count, unspecified: Secondary | ICD-10-CM

## 2013-01-12 DIAGNOSIS — Z853 Personal history of malignant neoplasm of breast: Secondary | ICD-10-CM | POA: Diagnosis not present

## 2013-01-12 DIAGNOSIS — R072 Precordial pain: Secondary | ICD-10-CM

## 2013-01-12 DIAGNOSIS — E119 Type 2 diabetes mellitus without complications: Secondary | ICD-10-CM | POA: Diagnosis not present

## 2013-01-12 DIAGNOSIS — R079 Chest pain, unspecified: Secondary | ICD-10-CM | POA: Diagnosis not present

## 2013-01-12 LAB — BASIC METABOLIC PANEL
Chloride: 102 mEq/L (ref 96–112)
Creatinine, Ser: 0.67 mg/dL (ref 0.50–1.10)
GFR calc Af Amer: >90 mL/min (ref >90)
GFR calc non Af Amer: 79 mL/min — ABNORMAL LOW (ref >90)
Potassium: 4.2 mEq/L (ref 3.5–5.1)

## 2013-01-12 LAB — URINALYSIS W MICROSCOPIC + REFLEX CULTURE
Bilirubin Urine: NEGATIVE
Glucose, UA: NEGATIVE mg/dL
Ketones, ur: NEGATIVE mg/dL
Specific Gravity, Urine: 1.015 (ref 1.005–1.030)
pH: 6 (ref 5.0–8.0)

## 2013-01-12 LAB — CBC
HCT: 37.4 % (ref 36.0–46.0)
Hemoglobin: 12.6 g/dL (ref 12.0–15.0)
MCHC: 33.7 g/dL (ref 30.0–36.0)
RDW: 14.4 % (ref 11.5–15.5)
WBC: 10.2 10*3/uL (ref 4.0–10.5)

## 2013-01-12 LAB — GLUCOSE, CAPILLARY
Glucose-Capillary: 137 mg/dL — ABNORMAL HIGH (ref 70–99)
Glucose-Capillary: 169 mg/dL — ABNORMAL HIGH (ref 70–99)

## 2013-01-12 LAB — HEMOGLOBIN A1C
Hgb A1c MFr Bld: 6.5 % — ABNORMAL HIGH (ref <5.7)
Mean Plasma Glucose: 140 mg/dL — ABNORMAL HIGH (ref <117)

## 2013-01-12 MED ORDER — TECHNETIUM TC 99M SESTAMIBI GENERIC - CARDIOLITE
30.0000 | Freq: Once | INTRAVENOUS | Status: AC | PRN
  Administered 2013-01-12: 30 via INTRAVENOUS

## 2013-01-12 MED ORDER — TECHNETIUM TC 99M SESTAMIBI GENERIC - CARDIOLITE
10.0000 | Freq: Once | INTRAVENOUS | Status: AC | PRN
  Administered 2013-01-12: 10 via INTRAVENOUS

## 2013-01-12 MED ORDER — REGADENOSON 0.4 MG/5ML IV SOLN
INTRAVENOUS | Status: AC
  Administered 2013-01-12: 0.4 mg
  Filled 2013-01-12: qty 5

## 2013-01-12 MED ORDER — REGADENOSON 0.4 MG/5ML IV SOLN
0.4000 mg | Freq: Once | INTRAVENOUS | Status: DC
  Filled 2013-01-12: qty 5

## 2013-01-12 NOTE — Progress Notes (Signed)
Nuclear Results: Final interpretation: Normal Lexiscan Myoview with no diagnostic electrocardiographic changes. The scintigraphic results show soft tissue attenuation but no ischemia. The gated ejection fraction was 83% and the wall motion was normal.  Result conveyed to pt. Primary team informed of normal stress test. Ronie Spies PA-C

## 2013-01-12 NOTE — Discharge Summary (Signed)
Physician Discharge Summary  Lauren [name] ZOX:[id number] DOB: 11/28/1927 DOA: 01/11/2013  PCP: Roxy Manns, MD  Admit date: 01/11/2013 Discharge date: 01/12/2013  Recommendations for Outpatient Follow-up:  1. Pt will need to follow up with PCP in 2 weeks post discharge 2. Please obtain BMP to evaluate electrolytes and kidney function 3. Please also check CBC to evaluate Hg and Hct levels   Discharge Diagnoses:  Principal Problem:   Chest pain Active Problems:   DIABETES MELLITUS, TYPE II   HYPERCHOLESTEROLEMIA, PURE   TIA   History of breast cancer, left   History of melanoma excision  atypical chest pain -myoview negative for reversible ischemia, EF 83% -May be related to GERD -Continue omeprazole as outpatient -Troponins negative x3 -EKG without any ischemic changes Diabetes mellitus type 2 -Hemoglobin A1c 6.5 -Given the patient's age and her hemoglobin A1c, the patient does not need any treatment at this time TIA history -Continue Plavix for secondary stroke prophylaxis Hyperlipidemia -Continue Zocor Leukocytosis -Improved -WBC 12.8 on the day of admission, 10.2 on the day of discharge -Remains afebrile and hemodynamically stable -Urinalysis without pyuria -Chest x-ray without infiltrates  Discharge Condition: stable  Disposition: home  Diet:heart healthy Wt Readings from Last 3 Encounters:  01/12/13 52.164 kg (115 lb)  12/28/12 50.349 kg (111 lb)  11/17/12 50.803 kg (112 lb)    History of present illness:  79 y.o. female, History of left-sided breast cancer with lumpectomy, left arm melanoma post excision, recent sinus infection finished antibiotics, diabetes mellitus type 2, dyslipidemia, TIA, comes to the ER after she started experiencing substernal pressure-like sensation early this morning when she woke up, pain has been intermittent, no aggravating factors relieved by nitroglycerin, no associated symptoms except mild nausea but she experienced when she  came to the ER, no association to exertion, no pleuritic nature, in the ER she received one pill of sublingual nitroglycerin with relief, she is now completely chest pain-free, she does feel weak overall but besides that she has currently no other subjective complaints.She denies any recent long travels, Denies any personal of family history of blood clots. She does not have any shortness of breath now or before.  In the ER initial EKG, chest x-ray and lab work unremarkable except for mild nonspecific leukocytosis, first set of troponin is negative. Cardiology was consulted.  Nuclear medicine stress test was ordered and was negative for any reversible ischemia.    Consultants: [place] [facility]  Discharge Exam: Filed Vitals:   01/12/13 1400  BP: 140/62  Pulse: 85  Temp: 98.6 F (37 C)  Resp: 16   Filed Vitals:   01/12/13 0904 01/12/13 0906 01/12/13 1300 01/12/13 1400  BP: 137/56 143/55  140/62  Pulse: 95 93  85  Temp:    98.6 F (37 C)  TempSrc:      Resp:    16  Height:   5\' 3"  (1.6 m)   Weight:   52.164 kg (115 lb)   SpO2:    98%   General: A&O x 3, NAD, pleasant, cooperative Cardiovascular: RRR, no rub, no gallop, no S3 Respiratory: CTAB, no wheeze, no rhonchi Abdomen:soft, nontender, nondistended, positive bowel sounds Extremities: No edema, No lymphangitis, no petechiae  Discharge Instructions      Discharge Orders   Future Appointments Provider Department Dept Phone   01/25/2013 9:30 AM [facility] [place] [phone]   02/01/2013 11:15 AM Judy Pimple, MD [facility] [place] [phone]   02/20/2013 3:30 PM  Huston Foley, MD [facility] [phone]   02/21/2013 9:15 AM Sherrie George, MD [facility] [phone]   03/28/2013 8:00 AM [facility] [place] [phone]   04/04/2013 10:30 AM Judy Pimple, MD [facility] [place] [phone]    10/18/2013 10:15 AM Dava Najjar [facility] [phone]   10/18/2013 10:45 AM Augustin Schooling, NP [facility] [phone]   Future Orders Complete By Expires   Diet - low sodium heart healthy  As directed    Increase activity slowly  As directed        Medication List    STOP taking these medications       polyethylene glycol packet  Commonly known as:  MIRALAX / GLYCOLAX  Replaced by:  polyethylene glycol powder powder      TAKE these medications       acetaminophen 500 MG tablet  Commonly known as:  TYLENOL  Take 500 mg by mouth every 6 (six) hours as needed for pain.     Calcium-Vitamin D 600-200 MG-UNIT per tablet  Take 1 tablet by mouth 2 (two) times daily.     citalopram 10 MG tablet  Commonly known as:  CELEXA  Take 10 mg by mouth daily.     clopidogrel 75 MG tablet  Commonly known as:  PLAVIX  Take 75 mg by mouth every morning.     fish oil-omega-3 fatty acids 1000 MG capsule  Take 1 g by mouth daily.     ketotifen 0.025 % ophthalmic solution  Commonly known as:  ZADITOR  Place 1 drop into both eyes 2 (two) times daily as needed (eye itching).     loratadine 10 MG tablet  Commonly known as:  CLARITIN  Take 10 mg by mouth daily.     omeprazole 40 MG capsule  Commonly known as:  PRILOSEC  Take 40 mg by mouth daily.     polyethylene glycol powder powder  Commonly known as:  GLYCOLAX/MIRALAX  DISSOLVE ONE (1) CAPFUL IN 4 TO 8 OUNCESOF FLUID ONCE DAILY AS DIRECTED.     polyethylene glycol packet  Commonly known as:  MIRALAX / GLYCOLAX  Take 17 g by mouth daily as needed (constipation).     PRESERVISION AREDS PO  Take 1 tablet by mouth daily.     PROBIOTIC DAILY PO  Take 1 tablet by mouth daily.     simvastatin 20 MG tablet  Commonly known as:  ZOCOR  Take 20 mg by mouth every evening.         The results of significant diagnostics from this hospitalization (including  imaging, microbiology, ancillary and laboratory) are listed below for reference.    Significant Diagnostic Studies: Dg Chest 2 View  01/11/2013   *RADIOLOGY REPORT*  Clinical Data: Chest pain radiating to right arm.  Hypertension. Diabetes.  CHEST - 2 VIEW  Comparison:  08/06/2010  Findings:  Heart size remains at the upper limits of normal.  Mild scarring again noted in the lingula.  No evidence of pulmonary infiltrate or edema.  No evidence of pleural effusion.  No mass or lymphadenopathy identified.  IMPRESSION: No active cardiopulmonary disease.   Original Report Authenticated By: Myles Rosenthal, M.D.   Ct Head Wo Contrast  12/28/2012   *RADIOLOGY REPORT*  Clinical Data: Head pain.  CT HEAD WITHOUT CONTRAST  Technique:  Contiguous axial images were obtained from the base of the  skull through the vertex without contrast.  Comparison: Head CT 10/27/2012.  Findings: Mild cerebral atrophy.  Extensive patchy and confluent areas of decreased attenuation throughout the deep and periventricular white matter of the cerebral hemispheres bilaterally, compatible with chronic microvascular ischemic disease. No acute intracranial abnormalities.  Specifically, no evidence of acute intracranial hemorrhage, no definite findings of acute/subacute cerebral ischemia, no mass, mass effect, hydrocephalus or abnormal intra or extra-axial fluid collections. Visualized paranasal sinuses and mastoids are well pneumatized, with exception of a extensive mucosal thickening and some frothy secretions which nearly completely opacified the right sphenoid sinus.  No acute displaced skull fractures are identified.  IMPRESSION: 1.  No acute intracranial abnormalities. 2.  However, there is near complete opacification of the right sphenoid sinus.  Clinical correlation for signs and symptoms of acute sinusitis may be warranted. 3.  Mild cerebral atrophy with extensive chronic microvascular ischemic changes throughout the deep and periventricular  white matter of the cerebral hemispheres bilaterally.   Original Report Authenticated By: Trudie Reed, M.D.   Nm Myocar Multi W/spect W/wall Motion / Ef  01/12/2013   79 year old female complaining of chest pain.  This study is performed to exclude ischemia.  This is a same day rest stress protocol.  30 mCi of Myoview were used for the stress images and 10 mCi of Myoview were used for the rest images.  Lexiscan was administered in the typical fashion.  The patients resting heart rate was 65 and following infusion 91. The blood pressure at rest was 137/56 and following infusion 143/55.  There was no chest pain during the study.  There were no ST changes.  The study was terminated per protocol.  Scintigraphic results:  The images were reconstructed in the short axis as well as the vertical and horizontal long axis.  The stress images reveal a small defect of mild intensity in the distal anterior wall.  When compared to the rest images there is no significant reversibility noted.  The gated ejection fraction was 83% and the wall motion was normal.  T I D - 0.84.  End-diastolic volume 35 ml, end-systolic volume 6 ml.  Final interpretation:  Normal Lexiscan Myoview with no diagnostic electrocardiographic changes.  The scintigraphic results show soft tissue attenuation but no ischemia.  The gated ejection fraction was 83% and the wall motion was normal.   Original Report Authenticated By: Olga Millers   Mm Digital Diagnostic Bilat  12/22/2012   *RADIOLOGY REPORT*  Clinical Data:  79 year old female with history of malignant lumpectomy of the left breast in 2009.  DIGITAL DIAGNOSTIC BILATERAL MAMMOGRAM  Comparison: Previous mammograms.  Findings:  ACR Breast Density Category b:  There are scattered areas of fibroglandular density.  A spot compression magnification view was obtained of the lumpectomy site in the left breast. There is no mammographic evidence of recurrent malignancy at the lumpectomy site in the  left breast.  There is no mammographic evidence of malignancy in the left breast.  Initially questioned cluster of calcifications in the upper outer right breast appear punctate and loosely grouped on the additional spot compression magnification views.  Mammographic images were processed with CAD.  IMPRESSION:  1.  Stable post lumpectomy site in the left breast.  No mammographic evidence of malignancy in the left breast. 2.  Probably benign calcifications in the upper outer right breast, which appear punctate and loosely grouped on the additional images.  RECOMMENDATION: 71-month follow-up diagnostic right mammogram of the probably benign right breast calcifications.  I  have discussed the findings and recommendations with the patient. Results were also provided in writing at the conclusion of the visit.  If applicable, a reminder letter will be sent to the patient regarding her next appointment.  BI-RADS CATEGORY 3:  Probably benign finding(s) - short interval follow-up suggested.   Original Report Authenticated By: Edwin Cap, M.D.     Microbiology: No results found for this or any previous visit (from the past 240 hour(s)).   Labs: Basic Metabolic Panel:  Recent Labs Lab 01/11/13 1430 01/11/13 1740 01/12/13 0400  NA 138  --  139  K 4.3  --  4.2  CL 103  --  102  CO2 24  --  27  GLUCOSE 101*  --  106*  BUN 22  --  17  CREATININE 0.66 0.70 0.67  CALCIUM 9.9  --  9.6   Liver Function Tests:  Recent Labs Lab 01/11/13 1430  AST 18  ALT 15  ALKPHOS 67  BILITOT 0.3  PROT 6.7  ALBUMIN 3.3*    Recent Labs Lab 01/11/13 1430  LIPASE 35   No results found for this basename: AMMONIA,  in the last 168 hours CBC:  Recent Labs Lab 01/11/13 1430 01/11/13 1740 01/12/13 0400  WBC 12.7* 12.8* 10.2  NEUTROABS 9.3*  --   --   HGB 12.9 12.9 12.6  HCT 37.3 37.6 37.4  MCV 91.6 92.4 92.3  PLT 204 210 193   Cardiac Enzymes:  Recent Labs Lab 01/11/13 1430 01/11/13 1610  01/11/13 2147 01/12/13 0400  TROPONINI <0.30 <0.30 <0.30 <0.30   BNP: No components found with this basename: POCBNP,  CBG:  Recent Labs Lab 01/11/13 1723 01/11/13 2109 01/12/13 1013 01/12/13 1128  GLUCAP 83 212* 137* 169*    Time coordinating discharge:  Greater than 30 minutes  Signed:  Kendyl Bissonnette, DO [facility] Pager: [phone] 01/12/2013, 4:07 PM

## 2013-01-12 NOTE — Progress Notes (Signed)
Utilization review completed.  

## 2013-01-12 NOTE — Progress Notes (Signed)
Reviewed discharge instructions with patient and she stated her understanding.  Patient instructed to followup with her primary doctor in the next 2 weeks.  Discharged home with nephew via wheelchair.  Colman Cater

## 2013-01-12 NOTE — Progress Notes (Signed)
    SUBJECTIVE: No chest pain or SOB this am.   Tele: NSR  BP 144/61  Pulse 62  Temp(Src) 98.2 F (36.8 C) (Oral)  Resp 18  SpO2 96% No intake or output data in the 24 hours ending 01/12/13 4098  PHYSICAL EXAM General: Well developed, well nourished, in no acute distress. Alert and oriented x 3.  Psych:  Good affect, responds appropriately Neck: No JVD. No masses noted.  Lungs: Clear bilaterally with no wheezes or rhonci noted.  Heart: RRR with slight systolic murmur noted. Abdomen: Bowel sounds are present. Soft, non-tender.  Extremities: No lower extremity edema.   LABS: Basic Metabolic Panel:  Recent Labs  11/91/47 1430 01/11/13 1740 01/12/13 0400  NA 138  --  139  K 4.3  --  4.2  CL 103  --  102  CO2 24  --  27  GLUCOSE 101*  --  106*  BUN 22  --  17  CREATININE 0.66 0.70 0.67  CALCIUM 9.9  --  9.6   CBC:  Recent Labs  01/11/13 1430 01/11/13 1740 01/12/13 0400  WBC 12.7* 12.8* 10.2  NEUTROABS 9.3*  --   --   HGB 12.9 12.9 12.6  HCT 37.3 37.6 37.4  MCV 91.6 92.4 92.3  PLT 204 210 193   Cardiac Enzymes:  Recent Labs  01/11/13 1610 01/11/13 2147 01/12/13 0400  TROPONINI <0.30 <0.30 <0.30   Current Meds: . citalopram  10 mg Oral Daily  . clopidogrel  75 mg Oral Q breakfast  . heparin  5,000 Units Subcutaneous Q8H  . insulin aspart  0-9 Units Subcutaneous TID WC  . nitroGLYCERIN  0.5 inch Topical Q6H  . pantoprazole  40 mg Oral Daily  . simvastatin  20 mg Oral QPM  . sodium chloride  3 mL Intravenous Q12H   Echo 01/11/13: Left ventricle: The cavity size was normal. Wall thickness was increased in a pattern of mild LVH. Systolic function was normal. The estimated ejection fraction was in the range of 55% to 60%. Wall motion was normal; there were no regional wall motion abnormalities. - Mitral valve: Mild regurgitation. - Atrial septum: No defect or patent foramen ovale was identified.  ASSESSMENT AND PLAN:  1. Chest pain: Her cardiac  risk factors are advanced age, DM, HLD. She has no objective evidence of ischemia. She has had no symptoms of unstable angina before yesterday. Cannot exclude cardiac etiology of chest pain. Echo without wall motion abnormalities. She has normal LVEF. Cardiac enzymes negative x 3. Will arrange Lexiscan stress myoview this am. If no ischemia, could be discharged home later today. Continue PPI for possible GI related etiology.     [place]  8/21/20146:32 AM

## 2013-01-12 NOTE — Progress Notes (Signed)
Nitro patch removed for NM study

## 2013-01-13 ENCOUNTER — Telehealth: Payer: Self-pay

## 2013-01-13 NOTE — Telephone Encounter (Signed)
No - they did all the labs I needed in the hospital - I hope she is feeling better

## 2013-01-13 NOTE — Telephone Encounter (Signed)
Canceled lab appt and notified pt

## 2013-01-13 NOTE — Telephone Encounter (Signed)
Pt left note; pt was seen at [facility] on 01/11/13 and had lab work, chest xray, stress test and echo. Pt wants to know if needs to keep lab  appt in our office on 01/25/13.Please advise.

## 2013-01-25 ENCOUNTER — Other Ambulatory Visit: Payer: Medicare Other

## 2013-01-25 ENCOUNTER — Encounter: Payer: Self-pay | Admitting: Family Medicine

## 2013-01-25 ENCOUNTER — Ambulatory Visit (INDEPENDENT_AMBULATORY_CARE_PROVIDER_SITE_OTHER): Payer: Medicare Other | Admitting: Family Medicine

## 2013-01-25 VITALS — BP 130/72 | HR 85 | Temp 98.8°F | Ht 62.5 in | Wt 116.2 lb

## 2013-01-25 DIAGNOSIS — R0789 Other chest pain: Secondary | ICD-10-CM

## 2013-01-25 DIAGNOSIS — F430 Acute stress reaction: Secondary | ICD-10-CM

## 2013-01-25 NOTE — Patient Instructions (Addendum)
I'm glad you are feeling better Make and effort to get out more - in a less stressful environment Be easy on yourself  No change in medicines If you ever want to see a counselor about stress let me know I will see you in November for health maintenance visit

## 2013-01-25 NOTE — Progress Notes (Signed)
Subjective:    Patient ID: Lauren [name], female    DOB: 1927/07/05, 79 y.o.   MRN: [id number]  HPI Here for f/u from recent hospitalization   Went in for atypical chest pain  Work up incl EKG and CXR and cardiac enzymes all negative  This was followed by card myoview-normal -very reassuring  Chest pain comes and goes at times  She takes omeprazole 40 mg   Lab Results  Component Value Date   HGBA1C 6.5* 01/11/2013      Chemistry      Component Value Date/Time   NA 139 01/12/2013 0400   K 4.2 01/12/2013 0400   CL 102 01/12/2013 0400   CO2 27 01/12/2013 0400   BUN 17 01/12/2013 0400   CREATININE 0.67 01/12/2013 0400      Component Value Date/Time   CALCIUM 9.6 01/12/2013 0400   ALKPHOS 67 01/11/2013 1430   AST 18 01/11/2013 1430   ALT 15 01/11/2013 1430   BILITOT 0.3 01/11/2013 1430     Lab Results  Component Value Date   WBC 10.2 01/12/2013   HGB 12.6 01/12/2013   HCT 37.4 01/12/2013   MCV 92.3 01/12/2013   PLT 193 01/12/2013    Pt does admit that taking care of her family  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 Chest pain, atypical 01/25/2013  . Stress reaction 01/25/2013  . Leukocytosis, unspecified 01/12/2013  . Chest pain 01/11/2013  . Hypoglycemia 10/27/2012  . History of melanoma excision 09/27/2012  . History of breast cancer, left 01/08/2011  . Pelvic fracture 09/17/2010  . DEGENERATIVE JOINT DISEASE 02/13/2010  . MIGRAINE VARIANT 11/06/2009  . OSTEOPENIA 10/18/2008  . NEOPLASM, MALIGNANT, BREAST. left, T1b, N0, lumpectomy 10/25/2007. 09/10/2008  . TIA 09/10/2008  . DIABETES MELLITUS, TYPE II 10/06/2007  .  VENOUS INSUFFICIENCY 10/06/2007  . DIVERTICULOSIS, COLON 10/06/2007  . FIBROCYSTIC BREAST DISEASE 10/06/2007  . HYPERCHOLESTEROLEMIA, PURE 11/29/2006   Past Medical History  Diagnosis Date  . Diabetes mellitus   . Gout     injection in the past  . Fibrocystic breast   . Venous insufficiency   . Hyperlipidemia   . TIA (transient ischemic attack) 08/2008    neg MRI/MRA and CT and carotid dopplers  . Chronic constipation   . Gastritis   . Chronic headaches   . Pelvic fracture   . Breast cancer     left breast  . Melanoma in situ of left upper extremity    Past Surgical History  Procedure Laterality Date  . Breast cyst aspiration    . Eye surgery      retinal, then cataract  . Breast lumpectomy  09/2007    with sentinel LN biopsy  . Appendectomy    . Breast lumpectomy      left   History  Substance Use Topics  . Smoking status: Never Smoker   . Smokeless tobacco: Never Used  . Alcohol Use: No   Family History  Problem Relation Age of Onset  . Breast cancer Sister   . Stroke  Sister   . Cancer Sister     breast  . Breast cancer Sister   . Stroke Sister   . Cancer Sister     breast  . Stroke Mother   . Stroke Father   . Stroke Brother   . Cancer Brother     prostate  . Stroke Brother   . Stroke Sister   . CAD Sister    Allergies  Allergen Reactions  . Aspirin     REACTION: GI  . Sulfonamide Derivatives     REACTION: itching   Current Outpatient Prescriptions on File Prior to Visit  Medication Sig Dispense Refill  . acetaminophen (TYLENOL) 500 MG tablet Take 500 mg by mouth every 6 (six) hours as needed for pain.       . Calcium-Vitamin D 600-200 MG-UNIT per tablet Take 1 tablet by mouth 2 (two) times daily.       . citalopram (CELEXA) 10 MG tablet Take 10 mg by mouth daily.      . clopidogrel (PLAVIX) 75 MG tablet Take 75 mg by mouth every morning.      . fish oil-omega-3 fatty acids 1000 MG capsule Take 1 g by mouth daily.        Marland Kitchen ketotifen  (ZADITOR) 0.025 % ophthalmic solution Place 1 drop into both eyes 2 (two) times daily as needed (eye itching).      Marland Kitchen loratadine (CLARITIN) 10 MG tablet Take 10 mg by mouth daily.      . Multiple Vitamins-Minerals (PRESERVISION AREDS PO) Take 1 tablet by mouth daily.      Marland Kitchen omeprazole (PRILOSEC) 40 MG capsule Take 40 mg by mouth daily.        . polyethylene glycol powder (GLYCOLAX/MIRALAX) powder DISSOLVE ONE (1) CAPFUL IN 4 TO 8 OUNCESOF FLUID ONCE DAILY AS DIRECTED.  527 g  5  . Probiotic Product (PROBIOTIC DAILY PO) Take 1 tablet by mouth daily.      . simvastatin (ZOCOR) 20 MG tablet Take 20 mg by mouth every evening.       No current facility-administered medications on file prior to visit.    Review of Systems Review of Systems  Constitutional: Negative for fever, appetite change, fatigue and unexpected weight change.  Eyes: Negative for pain and visual disturbance.  Respiratory: Negative for cough and shortness of breath.   Cardiovascular: Negative for palpitations   pos for occ non exertional cp on L, neg for chest tenderness  Gastrointestinal: Negative for nausea, diarrhea and constipation. pos for gas/ bloating and occ reflux  Genitourinary: Negative for urgency and frequency.  Skin: Negative for pallor or rash   Neurological: Negative for weakness, light-headedness, numbness and headaches.  Hematological: Negative for adenopathy. Does not bruise/bleed easily.  Psychiatric/Behavioral: Negative for dysphoric mood. Pos for anxiety and stressors         Objective:   Physical Exam  Constitutional: She appears well-developed and well-nourished. No distress.  Frail appearing elderly female  HENT:  Head: Normocephalic and atraumatic.  Mouth/Throat: Oropharynx is clear and moist.  Eyes: Conjunctivae and EOM are normal. Pupils are equal, round, and reactive to light. Right eye exhibits no discharge. Left eye exhibits no discharge. No scleral icterus.  Neck: Normal range of motion.  Neck supple. No JVD present. Carotid bruit is not present. No thyromegaly present.  Cardiovascular: Normal rate, regular rhythm, normal heart sounds and intact distal pulses.  Exam reveals no gallop.   Pulmonary/Chest: Effort normal and breath sounds normal. No respiratory  distress. She has no wheezes. She has no rales. She exhibits no tenderness.  No crackles No sob  Abdominal: Soft. Bowel sounds are normal. She exhibits no distension, no abdominal bruit and no mass. There is no tenderness.  Musculoskeletal: She exhibits no edema and no tenderness.  Despite recent dx with mild parkinson's no stiffness or cogwheel rigidity and gait is fairly normal    Lymphadenopathy:    She has no cervical adenopathy.  Neurological: She is alert. She has normal reflexes. She displays tremor. No cranial nerve deficit. She exhibits normal muscle tone. Coordination and gait normal.  Mild R handed tremor - comes and goes  No bradykinesia  Skin: Skin is warm and dry. No rash noted. No erythema. No pallor.  Psychiatric: Her behavior is normal. Thought content normal. Her mood appears anxious. Her affect is not labile. Her speech is not delayed, not tangential and not slurred. She is not agitated, not slowed and not withdrawn. Thought content is not paranoid. Cognition and memory are normal. She does not exhibit a depressed mood. She expresses no homicidal and no suicidal ideation.  Gets anxious at times and tearful when discussing stressors at home           Assessment & Plan:

## 2013-01-25 NOTE — Assessment & Plan Note (Signed)
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

## 2013-01-25 NOTE — Assessment & Plan Note (Signed)
Rev hosp notes and studies in detail with pt today She is stable  No cardiac cause found  GERD may play a role (as does stress) - and we disc this in detail She will continue omeprazole if it helps and update

## 2013-02-01 ENCOUNTER — Ambulatory Visit: Payer: Medicare Other | Admitting: Family Medicine

## 2013-02-15 ENCOUNTER — Ambulatory Visit: Payer: Medicare Other

## 2013-02-16 ENCOUNTER — Ambulatory Visit (INDEPENDENT_AMBULATORY_CARE_PROVIDER_SITE_OTHER): Payer: Medicare Other

## 2013-02-16 DIAGNOSIS — Z23 Encounter for immunization: Secondary | ICD-10-CM | POA: Diagnosis not present

## 2013-02-20 ENCOUNTER — Encounter: Payer: Self-pay | Admitting: Neurology

## 2013-02-20 ENCOUNTER — Ambulatory Visit (INDEPENDENT_AMBULATORY_CARE_PROVIDER_SITE_OTHER): Payer: Medicare Other | Admitting: Neurology

## 2013-02-20 VITALS — BP 114/66 | HR 98 | Temp 97.9°F | Ht 62.5 in | Wt 118.0 lb

## 2013-02-20 DIAGNOSIS — G459 Transient cerebral ischemic attack, unspecified: Secondary | ICD-10-CM | POA: Diagnosis not present

## 2013-02-20 DIAGNOSIS — R51 Headache: Secondary | ICD-10-CM | POA: Diagnosis not present

## 2013-02-20 DIAGNOSIS — G43909 Migraine, unspecified, not intractable, without status migrainosus: Secondary | ICD-10-CM

## 2013-02-20 DIAGNOSIS — G250 Essential tremor: Secondary | ICD-10-CM | POA: Diagnosis not present

## 2013-02-20 NOTE — Progress Notes (Signed)
Subjective:    Patient ID: Lauren [name] is a 79 y.o. female.  HPI  Interim history:   Lauren [name] is a very pleasant 79 year old right-handed woman with an underlying medical history of type 2 diabetes, melanoma, history of breast cancer, pelvic fracture, osteopenia, TIA, venous insufficiency, diverticulosis, hyperlipidemia who presents for followup consultation for her headaches and her history of TIA. She is accompanied by her niece is again today. I first met her on 11/17/2012 and she reported a TIA-like spell earlier in June and was seen in the hospital by my colleague Dr. Pearlean Brownie. He changed her aspirin to Plavix and held her metformin due to low blood sugar values. She reported being in her normal state of health during the late moring on 10/27/12 when she had sudden onset of R hand and arm shaking, which she not control, and she could not get her words out. Her sister's caregiver called 911. She had a full stroke w/u in the hospital which I reviewed: Her CT showed a small plaque in the MCA M2 branch and white matter changes, she had negative carotid Doppler studies, there was no acute stroke on MRI brain and she had intracranial atherosclerosis on MRA head. Her blood work was unremarkable, with normal lipids, A1c of 6.3. Carotid doppler study in June 2014 showed 0-49% ICA stenosis. She had transient neurological symptoms including difficulty forming her words and right arm shakiness, no facial droop. She has a long-standing history of migraine headaches, but also c/o a bandlike sensation in the front. For her headaches she takes tylenol. She was given neurontin by a HA specialist in the past, but it was not helpful.  Since she was discharged she has had more hand tremor and intermittent zoning out at time, no frank convulsions. She has had a hand tremor for years. Her 2 nephews have tremors.  Since her last visit she presented to the emergency room on 01/11/2013 with chest pain, relieved by  nitroglycerin sublingual. An acute coronary syndrome was excluded. She had a stress test: On 01/12/2013 she had a normal Lexi scan Myoview. She had a chest x-ray on 01/11/2013 which was negative for any acute or active cardiopulmonary disease. She had a head CT without contrast on 12/28/2012 which showed no acute intracranial abnormalities, near-complete opacification of the right sphenoid sinus, mild cerebral atrophy with extensive chronic microvascular ischemic changes. On 12/22/2012 she had a bilateral mammogram showing stable post lumpectomy site in the left breast and probably benign calcifications in the upper outer right breast which appear punctate and loosely grouped.  She saw her primary care physician on 01/25/2013 and endorsed anxiety and stressors. At the time of her first visit with me I ordered an EEG. This was normal in the awake state. I felt that her headaches were primarily tension-type headaches. She also had evidence of essential tremor, very mild. She is doing better in all aspect, she is starting to gain some weight back.   Her Past Medical History Is Significant For: Past Medical History  Diagnosis Date  . Diabetes mellitus   . Gout     injection in the past  . Fibrocystic breast   . Venous insufficiency   . Hyperlipidemia   . TIA (transient ischemic attack) 08/2008    neg MRI/MRA and CT and carotid dopplers  . Chronic constipation   . Gastritis   . Chronic headaches   . Pelvic fracture   . Breast cancer     left breast  . Melanoma  in situ of left upper extremity     Her Past Surgical History Is Significant For: Past Surgical History  Procedure Laterality Date  . Breast cyst aspiration    . Eye surgery      retinal, then cataract  . Breast lumpectomy  09/2007    with sentinel LN biopsy  . Appendectomy    . Breast lumpectomy      left    Her Family History Is Significant For: Family History  Problem Relation Age of Onset  . Breast cancer Sister   .  Stroke Sister   . Cancer Sister     breast  . Breast cancer Sister   . Stroke Sister   . Cancer Sister     breast  . Stroke Mother   . Stroke Father   . Stroke Brother   . Cancer Brother     prostate  . Stroke Brother   . Stroke Sister   . CAD Sister     Her Social History Is Significant For: History   Social History  . Marital Status: Single    Spouse Name: N/A    Number of Children: N/A  . Years of Education: N/A   Social History Main Topics  . Smoking status: Never Smoker   . Smokeless tobacco: Never Used  . Alcohol Use: No  . Drug Use: No  . Sexual Activity: Not Currently   Other Topics Concern  . None   Social History Narrative   Lives with and cares for both sisters   Strong faith    Her Allergies Are:  Allergies  Allergen Reactions  . Aspirin     REACTION: GI  . Sulfonamide Derivatives     REACTION: itching  :   Her Current Medications Are:  Outpatient Encounter Prescriptions as of 02/20/2013  Medication Sig Dispense Refill  . acetaminophen (TYLENOL) 500 MG tablet Take 500 mg by mouth every 6 (six) hours as needed for pain.       . Calcium-Vitamin D 600-200 MG-UNIT per tablet Take 1 tablet by mouth 2 (two) times daily.       . citalopram (CELEXA) 10 MG tablet Take 10 mg by mouth daily.      . clopidogrel (PLAVIX) 75 MG tablet Take 75 mg by mouth every morning.      . fish oil-omega-3 fatty acids 1000 MG capsule Take 1 g by mouth daily.        Marland Kitchen ketotifen (ZADITOR) 0.025 % ophthalmic solution Place 1 drop into both eyes 2 (two) times daily as needed (eye itching).      Marland Kitchen loratadine (CLARITIN) 10 MG tablet Take 10 mg by mouth daily.      . Multiple Vitamins-Minerals (PRESERVISION AREDS PO) Take 1 tablet by mouth daily.      Marland Kitchen omeprazole (PRILOSEC) 40 MG capsule Take 40 mg by mouth daily.        . polyethylene glycol powder (GLYCOLAX/MIRALAX) powder DISSOLVE ONE (1) CAPFUL IN 4 TO 8 OUNCESOF FLUID ONCE DAILY AS DIRECTED.  527 g  5  . Probiotic Product  (PROBIOTIC DAILY PO) Take 1 tablet by mouth daily.      . simvastatin (ZOCOR) 20 MG tablet Take 20 mg by mouth every evening.      Marland Kitchen ZOSTAVAX 40981 UNT/0.65ML injection        No facility-administered encounter medications on file as of 02/20/2013.  : Review of Systems  Skin:       Itching, moles  Allergic/Immunologic:  Positive for environmental allergies.  Neurological: Positive for speech difficulty and headaches.    Objective:  Neurologic Exam  Physical Exam Physical Examination:   Filed Vitals:   02/20/13 1100  BP: 114/66  Pulse: 98  Temp: 97.9 F (36.6 C)   General Examination: The patient is a very pleasant 79 y.o. female in no acute distress. She appears well-developed and well-nourished and well groomed. She is mildly anxious appearing.  HEENT: Normocephalic, atraumatic, pupils are equal, round and reactive to light and accommodation. Extraocular tracking is good without limitation to gaze excursion or nystagmus noted. Normal smooth pursuit is noted. Hearing is grossly intact. Face is symmetric with normal facial animation and normal facial sensation. Speech is clear with no dysarthria noted. There is no hypophonia. There is no lip, neck/head, jaw or voice tremor. Neck is supple with full range of passive and active motion. There are no carotid bruits on auscultation. Oropharynx exam reveals: mild mouth dryness, adequate dental hygiene and mild airway crowding. Mallampati is class II. Tongue protrudes centrally and palate elevates symmetrically.   Chest: Clear to auscultation without wheezing, rhonchi or crackles noted.  Heart: S1+S2+0, regular and normal without murmurs, rubs or gallops noted.   Abdomen: Soft, non-tender and non-distended with normal bowel sounds appreciated on auscultation.  Extremities: There is no pitting edema in the distal lower extremities bilaterally. Pedal pulses are intact.  Skin: Warm and dry without trophic changes noted. There are no varicose  veins.  Musculoskeletal: exam reveals no obvious joint deformities, tenderness or joint swelling or erythema.   Neurologically:  Mental status: The patient is awake, alert and oriented in all 4 spheres. Her memory, attention, language and knowledge are appropriate. There is no aphasia, agnosia, apraxia or anomia. Speech is clear with normal prosody and enunciation. Thought process is linear. Mood is congruent and affect is normal.  Cranial nerves are as described above under HEENT exam. In addition, shoulder shrug is normal with equal shoulder height noted. Motor exam: Normal bulk, strength and tone is noted. There is no drift, or rebound. She has a mild intermittent resting tremor on the R hand and a mild bilateral intermittent postural and action tremor bilaterally, R more than L. Romberg is negative. Reflexes are 2+ throughout. Fine motor skills are minimally impaired bilaterally.  Cerebellar testing shows no dysmetria or intention tremor on finger to nose testing. There is no truncal or gait ataxia.  Sensory exam is intact to light touch, pinprick, vibration, temperature sense and proprioception in the upper and lower extremities.  Gait, station and balance: She stands up with mild difficulty and her posture is mildly stooped. She walks cautiously and turns in 3 steps. She has a mild decrease in arm swing bilaterally.   Assessment and Plan:   In summary, Lauren [name] is a very pleasant 25 -year old female with a history of headaches, tremors, and Hx of TIA. She is overall doing better and has made some positive changes in her lifestyle to reduce stressors. Since she is stable, I will have her FU with her PCP. Her EEG was normal. Other w/u for TIA and ACS recently was negative as well. Lauren [name] is her niece and Lauren [name] is another niece who is involved in her care. She and her niece were in agreement.

## 2013-02-20 NOTE — Patient Instructions (Addendum)
I think overall you are doing fairly well and are stable at this point.   I do have some generic suggestions for you today:   Please make sure that you drink plenty of fluids. I would like for you to exercise daily for example in the form of walking 20-30 minutes every day, if you can. Please keep a regular sleep-wake schedule, keep regular meal times, do not skip any meals, eat  healthy snacks in between meals, such as fruit or nuts. Try to eat protein with every meal.   As far as your medications are concerned, I would like to suggest: no new medications.   Please remember, common headache triggers are: sleep deprivation, dehydration, overheating, stress, hypoglycemia or skipping meals and blood sugar fluctuations, excessive pain medications or excessive alcohol use or caffeine withdrawal. Some people have food triggers such as aged cheese, orange juice or chocolate, especially dark chocolate, or MSG (monosodium glutamate). Try to avoid these headache triggers as much possible. It may be helpful to keep a headache diary to figure out what makes your headaches worse or brings them on and what alleviates them. Some people report headache onset after exercise but studies have shown that regular exercise may actually prevent headaches from coming. If you have exercise-induced headaches, please make sure that you drink plenty of fluid before and after exercising and that you do not over do it and do not overheat.  As far as diagnostic testing, I recommend: no new test.   Engage in social activities in your community and with your family and try to keep up with current events by reading the newspaper or watching the news.  I do not think we need to make any changes in your medications at this point. I think you're stable enough that you can follow up with your PCP.

## 2013-02-21 ENCOUNTER — Ambulatory Visit (INDEPENDENT_AMBULATORY_CARE_PROVIDER_SITE_OTHER): Payer: Medicare Other | Admitting: Ophthalmology

## 2013-02-21 DIAGNOSIS — E1139 Type 2 diabetes mellitus with other diabetic ophthalmic complication: Secondary | ICD-10-CM | POA: Diagnosis not present

## 2013-02-21 DIAGNOSIS — H43819 Vitreous degeneration, unspecified eye: Secondary | ICD-10-CM | POA: Diagnosis not present

## 2013-02-21 DIAGNOSIS — E11319 Type 2 diabetes mellitus with unspecified diabetic retinopathy without macular edema: Secondary | ICD-10-CM | POA: Diagnosis not present

## 2013-02-21 DIAGNOSIS — H26499 Other secondary cataract, unspecified eye: Secondary | ICD-10-CM

## 2013-02-21 DIAGNOSIS — H3530 Unspecified macular degeneration: Secondary | ICD-10-CM | POA: Diagnosis not present

## 2013-03-22 DIAGNOSIS — D1801 Hemangioma of skin and subcutaneous tissue: Secondary | ICD-10-CM | POA: Diagnosis not present

## 2013-03-22 DIAGNOSIS — L821 Other seborrheic keratosis: Secondary | ICD-10-CM | POA: Diagnosis not present

## 2013-03-22 DIAGNOSIS — D239 Other benign neoplasm of skin, unspecified: Secondary | ICD-10-CM | POA: Diagnosis not present

## 2013-03-22 DIAGNOSIS — L819 Disorder of pigmentation, unspecified: Secondary | ICD-10-CM | POA: Diagnosis not present

## 2013-03-22 DIAGNOSIS — D485 Neoplasm of uncertain behavior of skin: Secondary | ICD-10-CM | POA: Diagnosis not present

## 2013-03-27 ENCOUNTER — Telehealth: Payer: Self-pay | Admitting: Family Medicine

## 2013-03-27 DIAGNOSIS — D72829 Elevated white blood cell count, unspecified: Secondary | ICD-10-CM

## 2013-03-27 DIAGNOSIS — E119 Type 2 diabetes mellitus without complications: Secondary | ICD-10-CM

## 2013-03-27 DIAGNOSIS — E7800 Pure hypercholesterolemia, unspecified: Secondary | ICD-10-CM

## 2013-03-27 DIAGNOSIS — M899 Disorder of bone, unspecified: Secondary | ICD-10-CM

## 2013-03-27 NOTE — Telephone Encounter (Signed)
Message copied by Judy Pimple on Mon Mar 27, 2013  9:40 PM ------      Message from: Alvina Chou      Created: Fri Mar 24, 2013  4:08 PM      Regarding: Lab orders for Tuesday, 11.4.14       Patient is scheduled for CPX labs, please order future labs, Thanks , Terri       ------

## 2013-03-28 ENCOUNTER — Other Ambulatory Visit (INDEPENDENT_AMBULATORY_CARE_PROVIDER_SITE_OTHER): Payer: Medicare Other

## 2013-03-28 DIAGNOSIS — M899 Disorder of bone, unspecified: Secondary | ICD-10-CM | POA: Diagnosis not present

## 2013-03-28 DIAGNOSIS — D72829 Elevated white blood cell count, unspecified: Secondary | ICD-10-CM

## 2013-03-28 DIAGNOSIS — E7800 Pure hypercholesterolemia, unspecified: Secondary | ICD-10-CM

## 2013-03-28 DIAGNOSIS — G459 Transient cerebral ischemic attack, unspecified: Secondary | ICD-10-CM

## 2013-03-28 DIAGNOSIS — E119 Type 2 diabetes mellitus without complications: Secondary | ICD-10-CM

## 2013-03-28 DIAGNOSIS — C50919 Malignant neoplasm of unspecified site of unspecified female breast: Secondary | ICD-10-CM

## 2013-03-28 DIAGNOSIS — E162 Hypoglycemia, unspecified: Secondary | ICD-10-CM | POA: Diagnosis not present

## 2013-03-28 LAB — CBC WITH DIFFERENTIAL/PLATELET
Basophils Relative: 1.3 % (ref 0.0–3.0)
Eosinophils Relative: 2.9 % (ref 0.0–5.0)
HCT: 40 % (ref 36.0–46.0)
Lymphs Abs: 2.1 10*3/uL (ref 0.7–4.0)
MCV: 92.3 fl (ref 78.0–100.0)
Monocytes Absolute: 0.8 10*3/uL (ref 0.1–1.0)
Monocytes Relative: 8.2 % (ref 3.0–12.0)
RBC: 4.33 Mil/uL (ref 3.87–5.11)
WBC: 10.3 10*3/uL (ref 4.5–10.5)

## 2013-03-28 LAB — COMPREHENSIVE METABOLIC PANEL
Albumin: 3.7 g/dL (ref 3.5–5.2)
Alkaline Phosphatase: 75 U/L (ref 39–117)
BUN: 21 mg/dL (ref 6–23)
CO2: 29 mEq/L (ref 19–32)
GFR: 86.31 mL/min (ref >60.00)
Glucose, Bld: 105 mg/dL — ABNORMAL HIGH (ref 70–99)
Potassium: 4.8 mEq/L (ref 3.5–5.1)
Total Bilirubin: 0.6 mg/dL (ref 0.3–1.2)
Total Protein: 7.2 g/dL (ref 6.0–8.3)

## 2013-03-28 LAB — TSH: TSH: 6.19 u[IU]/mL — ABNORMAL HIGH (ref 0.35–5.50)

## 2013-03-28 LAB — LIPID PANEL
Cholesterol: 188 mg/dL (ref 0–200)
LDL Cholesterol: 113 mg/dL — ABNORMAL HIGH (ref 0–99)
Triglycerides: 145 mg/dL (ref 0.0–149.0)

## 2013-03-28 LAB — HEMOGLOBIN A1C: Hgb A1c MFr Bld: 7.1 % — ABNORMAL HIGH (ref 4.6–6.5)

## 2013-04-04 ENCOUNTER — Ambulatory Visit (INDEPENDENT_AMBULATORY_CARE_PROVIDER_SITE_OTHER): Payer: Medicare Other | Admitting: Family Medicine

## 2013-04-04 ENCOUNTER — Encounter: Payer: Self-pay | Admitting: Family Medicine

## 2013-04-04 VITALS — BP 124/78 | HR 84 | Temp 97.7°F | Ht 62.25 in | Wt 121.0 lb

## 2013-04-04 DIAGNOSIS — Z0000 Encounter for general adult medical examination without abnormal findings: Secondary | ICD-10-CM

## 2013-04-04 DIAGNOSIS — R6889 Other general symptoms and signs: Secondary | ICD-10-CM

## 2013-04-04 DIAGNOSIS — Z1211 Encounter for screening for malignant neoplasm of colon: Secondary | ICD-10-CM

## 2013-04-04 DIAGNOSIS — Z022 Encounter for examination for admission to residential institution: Secondary | ICD-10-CM | POA: Insufficient documentation

## 2013-04-04 DIAGNOSIS — E119 Type 2 diabetes mellitus without complications: Secondary | ICD-10-CM

## 2013-04-04 DIAGNOSIS — E7800 Pure hypercholesterolemia, unspecified: Secondary | ICD-10-CM

## 2013-04-04 DIAGNOSIS — R7989 Other specified abnormal findings of blood chemistry: Secondary | ICD-10-CM

## 2013-04-04 DIAGNOSIS — M899 Disorder of bone, unspecified: Secondary | ICD-10-CM

## 2013-04-04 NOTE — Progress Notes (Signed)
Subjective:    Patient ID: Lauren [name], female    DOB: 02/02/1928, 79 y.o.   MRN: [id number]  HPI I have personally reviewed the Medicare Annual Wellness questionnaire and have noted 1. The patient's medical and social history 2. Their use of alcohol, tobacco or illicit drugs 3. Their current medications and supplements 4. The patient's functional ability including ADL's, fall risks, home safety risks and hearing or visual             impairment. 5. Diet and physical activities 6. Evidence for depression or mood disorders  The patients weight, height, BMI have been recorded in the chart and visual acuity is per eye clinic.  I have made referrals, counseling and provided education to the patient based review of the above and I have provided the pt with a written personalized care plan for preventive services.  Has been doing very well overall - feels pretty good today  See scanned forms.  Routine anticipatory guidance given to patient.  See health maintenance. Flu vaccine 9/14  Shingles vaccine 8/14 PNA 5/10  Tetanus 6/06  Colon cancer screening - will do IFOB -not interested in colonoscopy  Breast cancer screening (has hx of breast cancer) -- 7/14 (6 mo recheck for calcif in R breast)  No breast lumps on self exam  Advance directive-has a living will  Cognitive function addressed- see scanned forms- and if abnormal then additional documentation follows. - no major problems at all   PMH and SH reviewed  Meds, vitals, and allergies reviewed.   ROS: See HPI.  Otherwise negative.    OP dexa 7/13 - not due yet  No fractures  D level is 70- is good and theraputic - takes ca plus D   Hyperlipidemia Lab Results  Component Value Date   CHOL 188 03/28/2013   CHOL 134 10/28/2012   CHOL 131 03/21/2012   Lab Results  Component Value Date   HDL 45.90 03/28/2013   HDL 46 10/28/2012   HDL 44.80 03/21/2012   Lab Results  Component Value Date   LDLCALC 113* 03/28/2013   LDLCALC 59  10/28/2012   LDLCALC 64 03/21/2012   Lab Results  Component Value Date   TRIG 145.0 03/28/2013   TRIG 143 10/28/2012   TRIG 111.0 03/21/2012   Lab Results  Component Value Date   CHOLHDL 4 03/28/2013   CHOLHDL 2.9 10/28/2012   CHOLHDL 3 03/21/2012   Lab Results  Component Value Date   LDLDIRECT 46.3 10/01/2009    On simvastatin and diet  Overall - eating a bit more -snacking / but watches sat fats/ chol is up a bit    Diabetes Home sugar results -no hypoglycemia DM diet -eating more in general / and more snacking  Exercise - some / stays active - doing as much as she can/ would like to start walking more  Still does a fair amount of caregiving  Symptoms A1C last  Lab Results  Component Value Date   HGBA1C 7.1* 03/28/2013    She stopped her DM med last visit due to low sugar Renal protection  Last eye exam 9/14  Lab Results  Component Value Date   WBC 10.3 03/28/2013   HGB 13.5 03/28/2013   HCT 40.0 03/28/2013   MCV 92.3 03/28/2013   PLT 200.0 03/28/2013   Lab Results  Component Value Date   TSH 6.19* 03/28/2013  no symptoms at all  ? fam hx of thyroid problems  Chemistry      Component Value Date/Time   NA 140 03/28/2013 0813   K 4.8 03/28/2013 0813   CL 106 03/28/2013 0813   CO2 29 03/28/2013 0813   BUN 21 03/28/2013 0813   CREATININE 0.7 03/28/2013 0813      Component Value Date/Time   CALCIUM 9.5 03/28/2013 0813   ALKPHOS 75 03/28/2013 0813   AST 19 03/28/2013 0813   ALT 16 03/28/2013 0813   BILITOT 0.6 03/28/2013 0813         Patient Active Problem List   Diagnosis Date Noted  . Encounter for Medicare annual wellness exam 04/04/2013  . Chest pain, atypical 01/25/2013  . Stress reaction 01/25/2013  . Leukocytosis, unspecified 01/12/2013  . Chest pain 01/11/2013  . Hypoglycemia 10/27/2012  . History of melanoma excision 09/27/2012  . History of breast cancer, left 01/08/2011  . Pelvic fracture 09/17/2010  . DEGENERATIVE JOINT DISEASE 02/13/2010  .  MIGRAINE VARIANT 11/06/2009  . OSTEOPENIA 10/18/2008  . NEOPLASM, MALIGNANT, BREAST. left, T1b, N0, lumpectomy 10/25/2007. 09/10/2008  . TIA 09/10/2008  . DIABETES MELLITUS, TYPE II 10/06/2007  . VENOUS INSUFFICIENCY 10/06/2007  . DIVERTICULOSIS, COLON 10/06/2007  . FIBROCYSTIC BREAST DISEASE 10/06/2007  . HYPERCHOLESTEROLEMIA, PURE 11/29/2006   Past Medical History  Diagnosis Date  . Diabetes mellitus   . Gout     injection in the past  . Fibrocystic breast   . Venous insufficiency   . Hyperlipidemia   . TIA (transient ischemic attack) 08/2008    neg MRI/MRA and CT and carotid dopplers  . Chronic constipation   . Gastritis   . Chronic headaches   . Pelvic fracture   . Breast cancer     left breast  . Melanoma in situ of left upper extremity    Past Surgical History  Procedure Laterality Date  . Breast cyst aspiration    . Eye surgery      retinal, then cataract  . Breast lumpectomy  09/2007    with sentinel LN biopsy  . Appendectomy    . Breast lumpectomy      left   History  Substance Use Topics  . Smoking status: Never Smoker   . Smokeless tobacco: Never Used  . Alcohol Use: No   Family History  Problem Relation Age of Onset  . Breast cancer Sister   . Stroke Sister   . Cancer Sister     breast  . Breast cancer Sister   . Stroke Sister   . Cancer Sister     breast  . Stroke Mother   . Stroke Father   . Stroke Brother   . Cancer Brother     prostate  . Stroke Brother   . Stroke Sister   . CAD Sister    Allergies  Allergen Reactions  . Aspirin     REACTION: GI  . Sulfonamide Derivatives     REACTION: itching   Current Outpatient Prescriptions on File Prior to Visit  Medication Sig Dispense Refill  . acetaminophen (TYLENOL) 500 MG tablet Take 500 mg by mouth every 6 (six) hours as needed for pain.       . Calcium-Vitamin D 600-200 MG-UNIT per tablet Take 1 tablet by mouth 2 (two) times daily.       . citalopram (CELEXA) 10 MG tablet Take 10 mg  by mouth daily.      . clopidogrel (PLAVIX) 75 MG tablet Take 75 mg by mouth every morning.      Marland Kitchen  fish oil-omega-3 fatty acids 1000 MG capsule Take 1 g by mouth daily.        Marland Kitchen ketotifen (ZADITOR) 0.025 % ophthalmic solution Place 1 drop into both eyes 2 (two) times daily as needed (eye itching).      Marland Kitchen loratadine (CLARITIN) 10 MG tablet Take 10 mg by mouth daily.      . Multiple Vitamins-Minerals (PRESERVISION AREDS PO) Take 1 tablet by mouth daily.      Marland Kitchen omeprazole (PRILOSEC) 40 MG capsule Take 40 mg by mouth daily.        . polyethylene glycol powder (GLYCOLAX/MIRALAX) powder DISSOLVE ONE (1) CAPFUL IN 4 TO 8 OUNCESOF FLUID ONCE DAILY AS DIRECTED.  527 g  5  . Probiotic Product (PROBIOTIC DAILY PO) Take 1 tablet by mouth daily.      . simvastatin (ZOCOR) 20 MG tablet Take 20 mg by mouth every evening.      Marland Kitchen ZOSTAVAX 04540 UNT/0.65ML injection        No current facility-administered medications on file prior to visit.     Review of Systems Review of Systems  Constitutional: Negative for fever, appetite change, fatigue and unexpected weight change.  Eyes: Negative for pain and visual disturbance.  Respiratory: Negative for cough and shortness of breath.   Cardiovascular: Negative for cp or palpitations    Gastrointestinal: Negative for nausea, diarrhea and constipation.  Genitourinary: Negative for urgency and frequency.  Skin: Negative for pallor or rash   Neurological: Negative for weakness, light-headedness, numbness and headaches.  Hematological: Negative for adenopathy. Does not bruise/bleed easily.  Psychiatric/Behavioral: Negative for dysphoric mood. The patient is not nervous/anxious.  (much less anxious than in the past)       Objective:   Physical Exam  Constitutional: She appears well-developed and well-nourished. No distress.  HENT:  Head: Normocephalic and atraumatic.  Right Ear: External ear normal.  Left Ear: External ear normal.  Mouth/Throat: Oropharynx is  clear and moist.  Eyes: Conjunctivae and EOM are normal. Pupils are equal, round, and reactive to light. No scleral icterus.  Neck: Normal range of motion. Neck supple. No JVD present. Carotid bruit is not present. No thyromegaly present.  Cardiovascular: Normal rate, regular rhythm, normal heart sounds and intact distal pulses.  Exam reveals no gallop.   Pulmonary/Chest: Effort normal and breath sounds normal. No respiratory distress. She has no wheezes. She exhibits no tenderness.  Abdominal: Soft. Bowel sounds are normal. She exhibits no distension, no abdominal bruit and no mass. There is no tenderness.  Genitourinary: No breast swelling, tenderness, discharge or bleeding.  Breast exam: No mass, nodules, thickening, tenderness, bulging, retraction, inflamation, nipple discharge or skin changes noted.  No axillary or clavicular LA.  Chaperoned exam.    Musculoskeletal: Normal range of motion. She exhibits no edema and no tenderness.  Lymphadenopathy:    She has no cervical adenopathy.  Neurological: She is alert. She has normal reflexes. No cranial nerve deficit. She exhibits normal muscle tone. Coordination normal.  Skin: Skin is warm and dry. No rash noted. No erythema. No pallor.  SKs diffusely on back and trunk   Psychiatric: She has a normal mood and affect.  Less anxious than usual-in general more relaxed           Assessment & Plan:

## 2013-04-04 NOTE — Progress Notes (Signed)
Pre-visit discussion using our clinic review tool. No additional management support is needed unless otherwise documented below in the visit note.  

## 2013-04-04 NOTE — Patient Instructions (Signed)
Please do stool card for colon cancer screening  Schedule non fasting lab in 3 months Schedule follow up in 6 months  Take care of yourself

## 2013-04-05 NOTE — Assessment & Plan Note (Signed)
A1C is up off medicine to 7.1 This is pref to hypoglycemia with med (and also pt is less obscessive about diet control- which is good and she has put on needed wt) Will continue to follow closely  Will be realistic about goals and risks at her age

## 2013-04-05 NOTE — Assessment & Plan Note (Signed)
We will re check this in 3 mo  slt high tsh- no symptoms

## 2013-04-05 NOTE — Assessment & Plan Note (Signed)
IFOB given colonosc not recommended for screening at her age

## 2013-04-05 NOTE — Assessment & Plan Note (Signed)
utd dexa Disc need for calcium/ vitamin D/ wt bearing exercise and bone density test every 2 y to monitor Disc safety/ fracture risk in detail

## 2013-04-05 NOTE — Assessment & Plan Note (Signed)
Disc goals for lipids and reasons to control them Rev labs with pt Rev low sat fat diet in detail  Up a bit since she is eating more - but she needed calories to prevent excessive wt loss  Will inc simvastatin in future if needed

## 2013-04-05 NOTE — Assessment & Plan Note (Signed)
Reviewed health habits including diet and exercise and skin cancer prevention Also reviewed health mt list, fam hx and immunizations  See HPI Lab reviewed

## 2013-04-07 ENCOUNTER — Other Ambulatory Visit (INDEPENDENT_AMBULATORY_CARE_PROVIDER_SITE_OTHER): Payer: Medicare Other

## 2013-04-07 DIAGNOSIS — Z1211 Encounter for screening for malignant neoplasm of colon: Secondary | ICD-10-CM

## 2013-04-07 LAB — FECAL OCCULT BLOOD, IMMUNOCHEMICAL: Fecal Occult Bld: NEGATIVE

## 2013-04-10 ENCOUNTER — Encounter: Payer: Self-pay | Admitting: *Deleted

## 2013-04-10 ENCOUNTER — Encounter: Payer: Self-pay | Admitting: Family Medicine

## 2013-04-25 ENCOUNTER — Encounter: Payer: Self-pay | Admitting: Oncology

## 2013-04-25 NOTE — Progress Notes (Signed)
Called patient back and she said she didn't call but may have been her niece. She will check with her and will call back if needed.

## 2013-05-26 ENCOUNTER — Other Ambulatory Visit: Payer: Self-pay | Admitting: Dermatology

## 2013-05-26 DIAGNOSIS — L219 Seborrheic dermatitis, unspecified: Secondary | ICD-10-CM | POA: Diagnosis not present

## 2013-05-26 DIAGNOSIS — D239 Other benign neoplasm of skin, unspecified: Secondary | ICD-10-CM | POA: Diagnosis not present

## 2013-05-26 DIAGNOSIS — L821 Other seborrheic keratosis: Secondary | ICD-10-CM | POA: Diagnosis not present

## 2013-05-26 DIAGNOSIS — D1801 Hemangioma of skin and subcutaneous tissue: Secondary | ICD-10-CM | POA: Diagnosis not present

## 2013-05-26 DIAGNOSIS — D485 Neoplasm of uncertain behavior of skin: Secondary | ICD-10-CM | POA: Diagnosis not present

## 2013-05-26 DIAGNOSIS — Z85820 Personal history of malignant melanoma of skin: Secondary | ICD-10-CM | POA: Diagnosis not present

## 2013-05-26 DIAGNOSIS — L819 Disorder of pigmentation, unspecified: Secondary | ICD-10-CM | POA: Diagnosis not present

## 2013-05-26 DIAGNOSIS — C44319 Basal cell carcinoma of skin of other parts of face: Secondary | ICD-10-CM | POA: Diagnosis not present

## 2013-06-05 ENCOUNTER — Other Ambulatory Visit: Payer: Self-pay | Admitting: Family Medicine

## 2013-06-05 NOTE — Telephone Encounter (Signed)
Please refill for a year thanks 

## 2013-06-05 NOTE — Telephone Encounter (Signed)
Electronic refill request, please advise  

## 2013-06-05 NOTE — Telephone Encounter (Signed)
done

## 2013-06-07 ENCOUNTER — Ambulatory Visit (INDEPENDENT_AMBULATORY_CARE_PROVIDER_SITE_OTHER): Payer: Medicare Other | Admitting: Family Medicine

## 2013-06-07 ENCOUNTER — Encounter: Payer: Self-pay | Admitting: Family Medicine

## 2013-06-07 VITALS — BP 122/68 | HR 90 | Temp 98.7°F | Ht 62.25 in | Wt 123.5 lb

## 2013-06-07 DIAGNOSIS — J020 Streptococcal pharyngitis: Secondary | ICD-10-CM

## 2013-06-07 DIAGNOSIS — J029 Acute pharyngitis, unspecified: Secondary | ICD-10-CM

## 2013-06-07 DIAGNOSIS — R509 Fever, unspecified: Secondary | ICD-10-CM | POA: Diagnosis not present

## 2013-06-07 LAB — POCT INFLUENZA A/B
INFLUENZA A, POC: NEGATIVE
INFLUENZA B, POC: NEGATIVE

## 2013-06-07 LAB — POCT RAPID STREP A (OFFICE): Rapid Strep A Screen: POSITIVE — AB

## 2013-06-07 MED ORDER — AMOXICILLIN 500 MG PO CAPS: 500.0000 mg | ORAL_CAPSULE | Freq: Three times a day (TID) | ORAL | Status: DC

## 2013-06-07 NOTE — Progress Notes (Signed)
Subjective:    Patient ID: Lauren [name], female    DOB: April 05, 1928, 80 y.o.   MRN: [id number]  HPI Here with fever and ST and cough Symptoms started - with ST several days  Fever - this am 100.0  - significant   (99 last night)  Little dry tickly cough  Took some tylenol this am - and temp is down   Has a sore spot in her R ribs after lifting wood for a fire last week - stiff after inactivity - better during the day Has done some stretches   Positive rapid strep test today   Patient Active Problem List   Diagnosis Date Noted  . Encounter for Medicare annual wellness exam 04/04/2013  . Colon cancer screening 04/04/2013  . Abnormal TSH 04/04/2013  . Chest pain, atypical 01/25/2013  . Stress reaction 01/25/2013  . Leukocytosis, unspecified 01/12/2013  . Chest pain 01/11/2013  . Hypoglycemia 10/27/2012  . History of melanoma excision 09/27/2012  . History of breast cancer, left 01/08/2011  . Pelvic fracture 09/17/2010  . DEGENERATIVE JOINT DISEASE 02/13/2010  . MIGRAINE VARIANT 11/06/2009  . OSTEOPENIA 10/18/2008  . NEOPLASM, MALIGNANT, BREAST. left, T1b, N0, lumpectomy 10/25/2007. 09/10/2008  . TIA 09/10/2008  . DIABETES MELLITUS, TYPE II 10/06/2007  . VENOUS INSUFFICIENCY 10/06/2007  . DIVERTICULOSIS, COLON 10/06/2007  . FIBROCYSTIC BREAST DISEASE 10/06/2007  . HYPERCHOLESTEROLEMIA, PURE 11/29/2006   Past Medical History  Diagnosis Date  . Diabetes mellitus   . Gout     injection in the past  . Fibrocystic breast   . Venous insufficiency   . Hyperlipidemia   . TIA (transient ischemic attack) 08/2008    neg MRI/MRA and CT and carotid dopplers  . Chronic constipation   . Gastritis   . Chronic headaches   . Pelvic fracture   . Breast cancer     left breast  . Melanoma in situ of left upper extremity    Past Surgical History  Procedure Laterality Date  . Breast cyst aspiration    . Eye surgery      retinal, then cataract  . Breast lumpectomy  09/2007   with sentinel LN biopsy  . Appendectomy    . Breast lumpectomy      left   History  Substance Use Topics  . Smoking status: Never Smoker   . Smokeless tobacco: Never Used  . Alcohol Use: No   Family History  Problem Relation Age of Onset  . Breast cancer Sister   . Stroke Sister   . Cancer Sister     breast  . Breast cancer Sister   . Stroke Sister   . Cancer Sister     breast  . Stroke Mother   . Stroke Father   . Stroke Brother   . Cancer Brother     prostate  . Stroke Brother   . Stroke Sister   . CAD Sister    Allergies  Allergen Reactions  . Aspirin     REACTION: GI  . Sulfonamide Derivatives     REACTION: itching   Current Outpatient Prescriptions on File Prior to Visit  Medication Sig Dispense Refill  . acetaminophen (TYLENOL) 500 MG tablet Take 500 mg by mouth every 6 (six) hours as needed for pain.       . Calcium-Vitamin D 600-200 MG-UNIT per tablet Take 1 tablet by mouth 2 (two) times daily.       . citalopram (CELEXA) 10 MG tablet TAKE ONE (1)  TABLET BY MOUTH EVERY DAY  30 tablet  11  . clopidogrel (PLAVIX) 75 MG tablet Take 75 mg by mouth every morning.      . fish oil-omega-3 fatty acids 1000 MG capsule Take 1 g by mouth daily.        Marland Kitchen loratadine (CLARITIN) 10 MG tablet Take 10 mg by mouth daily.      . Multiple Vitamins-Minerals (PRESERVISION AREDS PO) Take 1 tablet by mouth daily.      Marland Kitchen omeprazole (PRILOSEC) 40 MG capsule Take 40 mg by mouth daily.        . polyethylene glycol powder (GLYCOLAX/MIRALAX) powder DISSOLVE ONE (1) CAPFUL IN 4 TO 8 OUNCESOF FLUID ONCE DAILY AS DIRECTED.  527 g  5  . Probiotic Product (PROBIOTIC DAILY PO) Take 1 tablet by mouth daily.      . simvastatin (ZOCOR) 20 MG tablet Take 20 mg by mouth every evening.      Marland Kitchen ZOSTAVAX 65681 UNT/0.65ML injection        No current facility-administered medications on file prior to visit.        Review of Systems Review of Systems  Constitutional: Negative for  appetite  change,  and unexpected weight change.  ENT pos for post nasal drip and st Eyes: Negative for pain and visual disturbance.  Respiratory: Negative for wheeze  and shortness of breath.   Cardiovascular: Negative for cp or palpitations    Gastrointestinal: Negative for nausea, diarrhea and constipation.  Genitourinary: Negative for urgency and frequency.  Skin: Negative for pallor or rash   Neurological: Negative for weakness, light-headedness, numbness and headaches.  Hematological: Negative for adenopathy. Does not bruise/bleed easily.  Psychiatric/Behavioral: Negative for dysphoric mood. The patient is not nervous/anxious.         Objective:   Physical Exam  Constitutional: She appears well-developed and well-nourished. No distress.  HENT:  Head: Normocephalic and atraumatic.  Right Ear: External ear normal.  Left Ear: External ear normal.  Mouth/Throat: No oropharyngeal exudate.  Nares are boggy Mild post throat injection without exudate or swelling   Eyes: Conjunctivae and EOM are normal. Pupils are equal, round, and reactive to light. Right eye exhibits no discharge. Left eye exhibits no discharge.  Neck: Normal range of motion. Neck supple.  Cardiovascular: Regular rhythm.  Exam reveals no gallop.   Pulmonary/Chest: Effort normal and breath sounds normal. No respiratory distress. She has no wheezes. She has no rales.  Lymphadenopathy:    She has no cervical adenopathy.  Neurological: She is alert.  Skin: Skin is warm and dry. No rash noted.  Psychiatric: She has a normal mood and affect.          Assessment & Plan:

## 2013-06-07 NOTE — Progress Notes (Signed)
Pre-visit discussion using our clinic review tool. No additional management support is needed unless otherwise documented below in the visit note.  

## 2013-06-07 NOTE — Patient Instructions (Signed)
Drink lots of fluids and rest  Take tylenol as needed for sore throat and fever or headache  Gargling with salt water is fine  You are contagious - until 24 hours on the antibiotic I cannot rule out a cold as well - take care of yourself

## 2013-06-08 NOTE — Assessment & Plan Note (Signed)
amox 500 tid  Use caution to prev transmission Disc symptomatic care - see instructions on AVS  Update if not starting to improve in a week or if worsening

## 2013-06-15 DIAGNOSIS — C44319 Basal cell carcinoma of skin of other parts of face: Secondary | ICD-10-CM | POA: Diagnosis not present

## 2013-06-26 ENCOUNTER — Other Ambulatory Visit: Payer: Self-pay | Admitting: Family Medicine

## 2013-06-27 ENCOUNTER — Encounter: Payer: Self-pay | Admitting: Family Medicine

## 2013-06-27 ENCOUNTER — Ambulatory Visit (INDEPENDENT_AMBULATORY_CARE_PROVIDER_SITE_OTHER)
Admission: RE | Admit: 2013-06-27 | Discharge: 2013-06-27 | Disposition: A | Discharge status: Home / Self Care | Payer: Medicare Other | Source: Ambulatory Visit | Attending: Family Medicine | Admitting: Family Medicine

## 2013-06-27 ENCOUNTER — Ambulatory Visit (INDEPENDENT_AMBULATORY_CARE_PROVIDER_SITE_OTHER): Payer: Medicare Other | Admitting: Family Medicine

## 2013-06-27 VITALS — BP 130/68 | HR 86 | Temp 97.7°F | Ht 62.25 in | Wt 123.0 lb

## 2013-06-27 DIAGNOSIS — R059 Cough, unspecified: Secondary | ICD-10-CM | POA: Diagnosis not present

## 2013-06-27 DIAGNOSIS — R053 Chronic cough: Secondary | ICD-10-CM

## 2013-06-27 DIAGNOSIS — R946 Abnormal results of thyroid function studies: Secondary | ICD-10-CM | POA: Diagnosis not present

## 2013-06-27 DIAGNOSIS — R0602 Shortness of breath: Secondary | ICD-10-CM | POA: Diagnosis not present

## 2013-06-27 DIAGNOSIS — R05 Cough: Secondary | ICD-10-CM | POA: Diagnosis not present

## 2013-06-27 DIAGNOSIS — J189 Pneumonia, unspecified organism: Secondary | ICD-10-CM | POA: Diagnosis not present

## 2013-06-27 DIAGNOSIS — E119 Type 2 diabetes mellitus without complications: Secondary | ICD-10-CM | POA: Diagnosis not present

## 2013-06-27 DIAGNOSIS — R7989 Other specified abnormal findings of blood chemistry: Secondary | ICD-10-CM

## 2013-06-27 LAB — T4, FREE: Free T4: 0.89 ng/dL (ref 0.60–1.60)

## 2013-06-27 LAB — TSH: TSH: 2.9 u[IU]/mL (ref 0.35–5.50)

## 2013-06-27 LAB — MICROALBUMIN / CREATININE URINE RATIO
CREATININE, U: 11.7 mg/dL
MICROALB/CREAT RATIO: 0.9 mg/g (ref 0.0–30.0)
Microalb, Ur: 0.1 mg/dL (ref 0.0–1.9)

## 2013-06-27 LAB — HEMOGLOBIN A1C: Hgb A1c MFr Bld: 7.3 % — ABNORMAL HIGH (ref 4.6–6.5)

## 2013-06-27 MED ORDER — MOXIFLOXACIN HCL 400 MG PO TABS: 400.0000 mg | ORAL_TABLET | Freq: Every day | ORAL | Status: DC

## 2013-06-27 NOTE — Progress Notes (Signed)
   Subjective:    Patient ID: Lauren [name], female    DOB: 04-13-1928, 80 y.o.   MRN: [id number]  Cough This is a new problem. The current episode started 1 to 4 weeks ago (She was treated for strep throat on 1/12... ST improved with  amoxicillin. She decveloped cough in last 10-14 days). The problem has been gradually worsening. The problem occurs every few minutes. The cough is non-productive. Associated symptoms include ear pain, headaches, nasal congestion and shortness of breath. Pertinent negatives include no chills, fever, myalgias, postnasal drip, sore throat or wheezing. Associated symptoms comments: occ twinges of pain in right ear, hoarse voice She does have shortness of breath with deep breaths, has some baseline SOB. Risk factors: non smoker. Treatments tried: ibuprofen, cough drops. The treatment provided mild relief. There is no history of asthma, bronchiectasis, bronchitis, COPD, emphysema, environmental allergies or pneumonia.  Shortness of Breath Associated symptoms include ear pain and headaches. Pertinent negatives include no fever, sore throat or wheezing. There is no history of asthma, COPD or pneumonia.      Review of Systems  Constitutional: Negative for fever and chills.  HENT: Positive for ear pain. Negative for postnasal drip and sore throat.   Respiratory: Positive for cough and shortness of breath. Negative for wheezing.   Musculoskeletal: Negative for myalgias.  Allergic/Immunologic: Negative for environmental allergies.  Neurological: Positive for headaches.       Objective:   Physical Exam  Constitutional: Vital signs are normal. She appears well-developed and well-nourished. She is cooperative.  Non-toxic appearance. She does not appear ill. No distress.  HENT:  Head: Normocephalic.  Right Ear: Hearing, tympanic membrane, external ear and ear canal normal. Tympanic membrane is not erythematous, not retracted and not bulging.  Left Ear: Hearing, tympanic  membrane, external ear and ear canal normal. Tympanic membrane is not erythematous, not retracted and not bulging.  Nose: Mucosal edema and rhinorrhea present. Right sinus exhibits no maxillary sinus tenderness and no frontal sinus tenderness. Left sinus exhibits no maxillary sinus tenderness and no frontal sinus tenderness.  Mouth/Throat: Uvula is midline and mucous membranes are normal. Posterior oropharyngeal erythema present. No oropharyngeal exudate or posterior oropharyngeal edema.  Eyes: Conjunctivae, EOM and lids are normal. Pupils are equal, round, and reactive to light. Lids are everted and swept, no foreign bodies found.  Neck: Trachea normal and normal range of motion. Neck supple. Carotid bruit is not present. No mass and no thyromegaly present.  Cardiovascular: Normal rate, regular rhythm, S1 normal, S2 normal, normal heart sounds, intact distal pulses and normal pulses.  Exam reveals no gallop and no friction rub.   No murmur heard. Pulmonary/Chest: Effort normal and breath sounds normal. Not tachypneic. No respiratory distress. She has no decreased breath sounds. She has no wheezes. She has no rhonchi. She has no rales.  Neurological: She is alert.  Skin: Skin is warm, dry and intact. No rash noted.  Psychiatric: Her speech is normal and behavior is normal. Judgment normal. Her mood appears not anxious. Cognition and memory are normal. She does not exhibit a depressed mood.          Assessment & Plan:

## 2013-06-27 NOTE — Assessment & Plan Note (Signed)
Will eval with CXR. If negative.. Still concern for bacterial bronchitits.. Consider zpak.

## 2013-06-27 NOTE — Addendum Note (Signed)
Addended by: Ellamae Sia on: 06/27/2013 10:53 AM   Modules accepted: Orders

## 2013-06-27 NOTE — Addendum Note (Signed)
Addended by: Carter Kitten on: 06/27/2013 01:58 PM   Modules accepted: Orders

## 2013-06-27 NOTE — Progress Notes (Signed)
Pre-visit discussion using our clinic review tool. No additional management support is needed unless otherwise documented below in the visit note.  

## 2013-06-27 NOTE — Patient Instructions (Signed)
Rest ,fluids.  We will call with CXR results.  Got to ER with severe shortness of breath.

## 2013-07-03 ENCOUNTER — Other Ambulatory Visit: Payer: Self-pay | Admitting: Oncology

## 2013-07-03 DIAGNOSIS — R921 Mammographic calcification found on diagnostic imaging of breast: Secondary | ICD-10-CM

## 2013-07-05 ENCOUNTER — Other Ambulatory Visit: Payer: Medicare Other

## 2013-07-12 ENCOUNTER — Telehealth: Payer: Self-pay

## 2013-07-12 ENCOUNTER — Ambulatory Visit (INDEPENDENT_AMBULATORY_CARE_PROVIDER_SITE_OTHER): Payer: Medicare Other | Admitting: Family Medicine

## 2013-07-12 ENCOUNTER — Encounter: Payer: Self-pay | Admitting: Family Medicine

## 2013-07-12 ENCOUNTER — Ambulatory Visit (INDEPENDENT_AMBULATORY_CARE_PROVIDER_SITE_OTHER)
Admission: RE | Admit: 2013-07-12 | Discharge: 2013-07-12 | Disposition: A | Discharge status: Home / Self Care | Payer: Medicare Other | Source: Ambulatory Visit | Attending: Family Medicine | Admitting: Family Medicine

## 2013-07-12 VITALS — BP 128/84 | HR 90 | Temp 97.5°F | Ht 62.25 in | Wt 123.5 lb

## 2013-07-12 DIAGNOSIS — J189 Pneumonia, unspecified organism: Secondary | ICD-10-CM | POA: Diagnosis not present

## 2013-07-12 DIAGNOSIS — J449 Chronic obstructive pulmonary disease, unspecified: Secondary | ICD-10-CM | POA: Diagnosis not present

## 2013-07-12 DIAGNOSIS — E119 Type 2 diabetes mellitus without complications: Secondary | ICD-10-CM

## 2013-07-12 MED ORDER — MOXIFLOXACIN HCL 400 MG PO TABS: 400.0000 mg | ORAL_TABLET | Freq: Every day | ORAL | Status: DC

## 2013-07-12 NOTE — Telephone Encounter (Signed)
Dr. Glori Bickers addressed with pt at [facility]

## 2013-07-12 NOTE — Assessment & Plan Note (Signed)
Clinically much improved  Still some scant rales over R lung base (where pneumonia was seen on cxr ) Repeat xr today  Extend abx (avelox 400) for 7 more days Update if not starting to improve in a week or if worsening  (inst to call immed or seek care if sob)

## 2013-07-12 NOTE — Telephone Encounter (Signed)
Aware-will disc at her visit

## 2013-07-12 NOTE — Telephone Encounter (Signed)
[name] said pt has appt this AM at 11:30 am. And [name] cannot come with pt to appt. [name] wanted Dr Glori Bickers to know that pt is coughing and pain in rt side as when she had pneumonia.

## 2013-07-12 NOTE — Progress Notes (Signed)
Pre-visit discussion using our clinic review tool. No additional management support is needed unless otherwise documented below in the visit note.  

## 2013-07-12 NOTE — Patient Instructions (Signed)
Re checking chest xray today  I still hear a little congestion in the right lung (this is the area you have pain in )  Take 7 more days of the avelox- I sent it to [facility]  We will call you with results If symptoms worsen or change please let me know  Sugar is up a bit -likely due to the pneumonia

## 2013-07-12 NOTE — Progress Notes (Signed)
Subjective:    Patient ID: Lauren [name], female    DOB: 1927-06-30, 80 y.o.   MRN: [id number]  HPI Here for f/u of pneumonia  Rlower lung base infiltrate seen on CXR on 2/2  Some pain in her R flank/ mid back area -- comes and goes  Her cough is overall better - just now and then -not prod at all  Does not feel short of breath  No wheezing  02 sat is 94%  Finished 10 days of her avelox - no side effects   Lab Results  Component Value Date   HGBA1C 7.3* 06/27/2013   this is up a bit due to being sick overall  Thyroid labs ok   Patient Active Problem List   Diagnosis Date Noted  . Pneumonia involving right lung 07/12/2013  . Cough 06/27/2013  . SOB (shortness of breath) 06/27/2013  . Streptococcal sore throat 06/07/2013  . Encounter for Medicare annual wellness exam 04/04/2013  . Colon cancer screening 04/04/2013  . Abnormal TSH 04/04/2013  . Stress reaction 01/25/2013  . Leukocytosis, unspecified 01/12/2013  . Hypoglycemia 10/27/2012  . History of melanoma excision 09/27/2012  . History of breast cancer, left 01/08/2011  . Pelvic fracture 09/17/2010  . DEGENERATIVE JOINT DISEASE 02/13/2010  . MIGRAINE VARIANT 11/06/2009  . OSTEOPENIA 10/18/2008  . NEOPLASM, MALIGNANT, BREAST. left, T1b, N0, lumpectomy 10/25/2007. 09/10/2008  . TIA 09/10/2008  . DIABETES MELLITUS, TYPE II 10/06/2007  . VENOUS INSUFFICIENCY 10/06/2007  . DIVERTICULOSIS, COLON 10/06/2007  . FIBROCYSTIC BREAST DISEASE 10/06/2007  . HYPERCHOLESTEROLEMIA, PURE 11/29/2006   Past Medical History  Diagnosis Date  . Diabetes mellitus   . Gout     injection in the past  . Fibrocystic breast   . Venous insufficiency   . Hyperlipidemia   . TIA (transient ischemic attack) 08/2008    neg MRI/MRA and CT and carotid dopplers  . Chronic constipation   . Gastritis   . Chronic headaches   . Pelvic fracture   . Breast cancer     left breast  . Melanoma in situ of left upper extremity    Past Surgical  History  Procedure Laterality Date  . Breast cyst aspiration    . Eye surgery      retinal, then cataract  . Breast lumpectomy  09/2007    with sentinel LN biopsy  . Appendectomy    . Breast lumpectomy      left   History  Substance Use Topics  . Smoking status: Never Smoker   . Smokeless tobacco: Never Used  . Alcohol Use: No   Family History  Problem Relation Age of Onset  . Breast cancer Sister   . Stroke Sister   . Cancer Sister     breast  . Breast cancer Sister   . Stroke Sister   . Cancer Sister     breast  . Stroke Mother   . Stroke Father   . Stroke Brother   . Cancer Brother     prostate  . Stroke Brother   . Stroke Sister   . CAD Sister    Allergies  Allergen Reactions  . Aspirin     REACTION: GI  . Sulfonamide Derivatives     REACTION: itching   Current Outpatient Prescriptions on File Prior to Visit  Medication Sig Dispense Refill  . acetaminophen (TYLENOL) 500 MG tablet Take 500 mg by mouth every 6 (six) hours as needed for pain.       Marland Kitchen  Calcium-Vitamin D 600-200 MG-UNIT per tablet Take 1 tablet by mouth 2 (two) times daily.       . citalopram (CELEXA) 10 MG tablet TAKE ONE (1) TABLET BY MOUTH EVERY DAY  30 tablet  11  . clopidogrel (PLAVIX) 75 MG tablet TAKE ONE TABLET BY MOUTH EVERY MORNING WITH BREAKFAST  30 tablet  4  . fish oil-omega-3 fatty acids 1000 MG capsule Take 1 g by mouth daily.        Marland Kitchen loratadine (CLARITIN) 10 MG tablet Take 10 mg by mouth daily.      . Multiple Vitamins-Minerals (PRESERVISION AREDS PO) Take 1 tablet by mouth daily.      Marland Kitchen omeprazole (PRILOSEC) 40 MG capsule Take 40 mg by mouth daily.        . polyethylene glycol powder (GLYCOLAX/MIRALAX) powder DISSOLVE ONE (1) CAPFUL (17GM) INTO 4 TO8 OUNCES OF FLUID AND TAKE BYMOUTH ONCE DAILY AS DIRECTED  527 g  4  . Probiotic Product (PROBIOTIC DAILY PO) Take 1 tablet by mouth daily.      . simvastatin (ZOCOR) 20 MG tablet TAKE ONE TABLET BY MOUTH EVERY NIGHT AT BEDTIME  30  tablet  4  . ZOSTAVAX 37169 UNT/0.65ML injection        No current facility-administered medications on file prior to visit.     Review of Systems    Review of Systems  Constitutional: Negative for fever, appetite change,  and unexpected weight change. pos for fatigue that is improving  ENt neg for congestion or ST Eyes: Negative for pain and visual disturbance.  Respiratory: pos for cough (improved)/ neg for wheeze or sob.   Cardiovascular: Negative for cp or palpitations    Gastrointestinal: Negative for nausea, diarrhea and constipation.  Genitourinary: Negative for urgency and frequency.  Skin: Negative for pallor or rash   MSK pos for R mid back soreness when she coughs Neurological: Negative for weakness, light-headedness, numbness and headaches.  Hematological: Negative for adenopathy. Does not bruise/bleed easily.  Psychiatric/Behavioral: Negative for dysphoric mood. The patient is not nervous/anxious.      Objective:   Physical Exam  Constitutional: She appears well-developed and well-nourished. No distress.  HENT:  Head: Normocephalic and atraumatic.  Mouth/Throat: Oropharynx is clear and moist.  Eyes: Conjunctivae and EOM are normal. Pupils are equal, round, and reactive to light.  Neck: Normal range of motion. Neck supple.  Cardiovascular: Normal rate and regular rhythm.  Exam reveals no gallop.   Pulmonary/Chest: Effort normal. No respiratory distress. She has no wheezes. She has rales. She exhibits tenderness.  A few scant rales in R base  No rhonchi/ wheeze or prolonged exp phase   Mild R lower cw tenderness   Abdominal: Soft. Bowel sounds are normal.  Lymphadenopathy:    She has no cervical adenopathy.  Neurological: She is alert.  Skin: Skin is warm and dry. No rash noted.  Psychiatric: She has a normal mood and affect.          Assessment & Plan:

## 2013-07-12 NOTE — Assessment & Plan Note (Signed)
Lab Results  Component Value Date   HGBA1C 7.3* 06/27/2013   This is up slt -however in light of recent pneumonia not surprised  Disc low glycemic diet  Will continue to follow

## 2013-07-21 ENCOUNTER — Ambulatory Visit (INDEPENDENT_AMBULATORY_CARE_PROVIDER_SITE_OTHER): Payer: Medicare Other | Admitting: Family Medicine

## 2013-07-21 ENCOUNTER — Encounter: Payer: Self-pay | Admitting: Family Medicine

## 2013-07-21 VITALS — BP 132/70 | HR 76 | Temp 97.4°F | Ht 62.25 in | Wt 124.0 lb

## 2013-07-21 DIAGNOSIS — R05 Cough: Secondary | ICD-10-CM | POA: Diagnosis not present

## 2013-07-21 DIAGNOSIS — J189 Pneumonia, unspecified organism: Secondary | ICD-10-CM

## 2013-07-21 DIAGNOSIS — R0602 Shortness of breath: Secondary | ICD-10-CM | POA: Diagnosis not present

## 2013-07-21 DIAGNOSIS — R059 Cough, unspecified: Secondary | ICD-10-CM | POA: Diagnosis not present

## 2013-07-21 MED ORDER — LEVOFLOXACIN 500 MG PO TABS: 500.0000 mg | ORAL_TABLET | Freq: Every day | ORAL | Status: DC

## 2013-07-21 NOTE — Progress Notes (Signed)
Pre visit review using our clinic review tool, if applicable. No additional management support is needed unless otherwise documented below in the visit note. 

## 2013-07-21 NOTE — Patient Instructions (Addendum)
Start additional course of antibiotics. Hold celexa. Tylenol for pain control. Heat, gentle chest wall stretching. Schedule follow up with Dr. Glori Bickers mid next week to make sure things are getting better.

## 2013-07-21 NOTE — Assessment & Plan Note (Signed)
Given continued right chest wall pain and malaise.. Will change antibiotics to levaquin for 10 more days.  Close follow up with PCP.  May need repeat CXR at that time if not better.  Pain may also be from chest wall injury... Recommended tylenol for pain.

## 2013-07-21 NOTE — Progress Notes (Signed)
   Subjective:    Patient ID: Lauren [name], female    DOB: [birth year], 80 y.o.   MRN: [id number]  Flank Pain Pertinent negatives include no abdominal pain, chest pain, dysuria or fever.  Pneumonia She complains of shortness of breath. There is no wheezing. Pertinent negatives include no chest pain, ear pain or fever.     80 year old female  with DM, hx of TIA, breast cancer and melanoma, Pneumonia dx  2/2on  presents with  continued right flank pain.  S/P 10 days of avelox  As well as repeat course avelox x 7 days Repeat CX ron 2/18 showed: improving but incompletely resolved  right middle lobe PNA.   Today she comes with her family member.   She reports that she completed antibiotics 3 days ago. She had noted improvement in  right flank and chest wall pain, but it returned in last 2 days off antibiotics. Pain with breaths and at rest. No sharp pain, just nagging dull ache. Slightly nauseous. No fever, minimal cough. Some DOE which is not usual for her.     Review of Systems  Constitutional: Negative for fever and fatigue.  HENT: Negative for ear pain.   Eyes: Negative for pain.  Respiratory: Positive for shortness of breath. Negative for chest tightness and wheezing.   Cardiovascular: Negative for chest pain, palpitations and leg swelling.  Gastrointestinal: Negative for abdominal pain.  Genitourinary: Positive for flank pain. Negative for dysuria.       Objective:   Physical Exam  Constitutional: Vital signs are normal. She appears well-developed and well-nourished. She is cooperative.  Non-toxic appearance. She does not appear ill. No distress.  fatigued appearing elderly female in NAD  HENT:  Head: Normocephalic.  Right Ear: Hearing, tympanic membrane, external ear and ear canal normal. Tympanic membrane is not erythematous, not retracted and not bulging.  Left Ear: Hearing, tympanic membrane, external ear and ear canal normal. Tympanic membrane is not erythematous,  not retracted and not bulging.  Nose: No mucosal edema or rhinorrhea. Right sinus exhibits no maxillary sinus tenderness and no frontal sinus tenderness. Left sinus exhibits no maxillary sinus tenderness and no frontal sinus tenderness.  Mouth/Throat: Uvula is midline, oropharynx is clear and moist and mucous membranes are normal.  Eyes: Conjunctivae, EOM and lids are normal. Pupils are equal, round, and reactive to light. Lids are everted and swept, no foreign bodies found.  Neck: Trachea normal and normal range of motion. Neck supple. Carotid bruit is not present. No mass and no thyromegaly present.  Cardiovascular: Normal rate, regular rhythm, S1 normal, S2 normal, normal heart sounds, intact distal pulses and normal pulses.  Exam reveals no gallop and no friction rub.   No murmur heard. Pulmonary/Chest: Effort normal and breath sounds normal. Not tachypneic. No respiratory distress. She has no decreased breath sounds. She has no wheezes. She has no rhonchi. She has no rales. She exhibits tenderness.  Tender over right mid ant, lateral and posterior chest walls, tender under right breast.No focal bony tenderness  Abdominal: Soft. Normal appearance and bowel sounds are normal. There is no tenderness.  Neurological: She is alert.  Skin: Skin is warm, dry and intact. No rash noted.  Psychiatric: Her speech is normal and behavior is normal. Judgment and thought content normal. Her mood appears not anxious. Cognition and memory are normal. She does not exhibit a depressed mood.          Assessment & Plan:

## 2013-07-26 ENCOUNTER — Ambulatory Visit (INDEPENDENT_AMBULATORY_CARE_PROVIDER_SITE_OTHER)
Admission: RE | Admit: 2013-07-26 | Discharge: 2013-07-26 | Disposition: A | Discharge status: Home / Self Care | Payer: Medicare Other | Source: Ambulatory Visit | Attending: Family Medicine | Admitting: Family Medicine

## 2013-07-26 ENCOUNTER — Encounter: Payer: Self-pay | Admitting: Family Medicine

## 2013-07-26 ENCOUNTER — Ambulatory Visit (INDEPENDENT_AMBULATORY_CARE_PROVIDER_SITE_OTHER): Payer: Medicare Other | Admitting: Family Medicine

## 2013-07-26 VITALS — BP 118/68 | HR 74 | Temp 97.3°F | Ht 62.25 in | Wt 120.5 lb

## 2013-07-26 DIAGNOSIS — J189 Pneumonia, unspecified organism: Secondary | ICD-10-CM

## 2013-07-26 NOTE — Progress Notes (Signed)
Subjective:    Patient ID: Lauren [name], female    DOB: 08-04-1927, 80 y.o.   MRN: [id number]  HPI  Here for f/u of pneumonia R Was on several courses of avelox - interval imp clinically and on xray 2/18  Then after 3 days off abx - side pain and sob returned Dr Diona Browner put her on levaquin   She has a hard time sleeping on this medicine and some GI upset  She still has some soreness over R flank area/chest wall- that has improved  Cough once in a while -not very often  A little sob if she exerts herself   Has overall improved a little   Patient Active Problem List   Diagnosis Date Noted  . Pneumonia involving right lung 07/12/2013  . Cough 06/27/2013  . SOB (shortness of breath) 06/27/2013  . Streptococcal sore throat 06/07/2013  . Encounter for Medicare annual wellness exam 04/04/2013  . Colon cancer screening 04/04/2013  . Abnormal TSH 04/04/2013  . Stress reaction 01/25/2013  . Leukocytosis, unspecified 01/12/2013  . Hypoglycemia 10/27/2012  . History of melanoma excision 09/27/2012  . History of breast cancer, left 01/08/2011  . Pelvic fracture 09/17/2010  . DEGENERATIVE JOINT DISEASE 02/13/2010  . MIGRAINE VARIANT 11/06/2009  . OSTEOPENIA 10/18/2008  . NEOPLASM, MALIGNANT, BREAST. left, T1b, N0, lumpectomy 10/25/2007. 09/10/2008  . TIA 09/10/2008  . DIABETES MELLITUS, TYPE II 10/06/2007  . VENOUS INSUFFICIENCY 10/06/2007  . DIVERTICULOSIS, COLON 10/06/2007  . FIBROCYSTIC BREAST DISEASE 10/06/2007  . HYPERCHOLESTEROLEMIA, PURE 11/29/2006   Past Medical History  Diagnosis Date  . Diabetes mellitus   . Gout     injection in the past  . Fibrocystic breast   . Venous insufficiency   . Hyperlipidemia   . TIA (transient ischemic attack) 08/2008    neg MRI/MRA and CT and carotid dopplers  . Chronic constipation   . Gastritis   . Chronic headaches   . Pelvic fracture   . Breast cancer     left breast  . Melanoma in situ of left upper extremity    Past  Surgical History  Procedure Laterality Date  . Breast cyst aspiration    . Eye surgery      retinal, then cataract  . Breast lumpectomy  09/2007    with sentinel LN biopsy  . Appendectomy    . Breast lumpectomy      left   History  Substance Use Topics  . Smoking status: Never Smoker   . Smokeless tobacco: Never Used  . Alcohol Use: No   Family History  Problem Relation Age of Onset  . Breast cancer Sister   . Stroke Sister   . Cancer Sister     breast  . Breast cancer Sister   . Stroke Sister   . Cancer Sister     breast  . Stroke Mother   . Stroke Father   . Stroke Brother   . Cancer Brother     prostate  . Stroke Brother   . Stroke Sister   . CAD Sister    Allergies  Allergen Reactions  . Aspirin     REACTION: GI  . Sulfonamide Derivatives     REACTION: itching   Current Outpatient Prescriptions on File Prior to Visit  Medication Sig Dispense Refill  . acetaminophen (TYLENOL) 500 MG tablet Take 500 mg by mouth every 6 (six) hours as needed for pain.       . Calcium-Vitamin D 600-200 MG-UNIT  per tablet Take 1 tablet by mouth 2 (two) times daily.       . citalopram (CELEXA) 10 MG tablet TAKE ONE (1) TABLET BY MOUTH EVERY DAY  30 tablet  11  . clopidogrel (PLAVIX) 75 MG tablet TAKE ONE TABLET BY MOUTH EVERY MORNING WITH BREAKFAST  30 tablet  4  . fish oil-omega-3 fatty acids 1000 MG capsule Take 1 g by mouth daily.        Marland Kitchen levofloxacin (LEVAQUIN) 500 MG tablet Take 1 tablet (500 mg total) by mouth daily.  10 tablet  0  . loratadine (CLARITIN) 10 MG tablet Take 10 mg by mouth daily.      . Multiple Vitamins-Minerals (PRESERVISION AREDS PO) Take 1 tablet by mouth daily.      Marland Kitchen omeprazole (PRILOSEC) 40 MG capsule Take 40 mg by mouth daily.        . polyethylene glycol powder (GLYCOLAX/MIRALAX) powder DISSOLVE ONE (1) CAPFUL (17GM) INTO 4 TO8 OUNCES OF FLUID AND TAKE BYMOUTH ONCE DAILY AS DIRECTED  527 g  4  . Probiotic Product (PROBIOTIC DAILY PO) Take 1 tablet by  mouth daily.      . simvastatin (ZOCOR) 20 MG tablet TAKE ONE TABLET BY MOUTH EVERY NIGHT AT BEDTIME  30 tablet  4  . ZOSTAVAX 88502 UNT/0.65ML injection        No current facility-administered medications on file prior to visit.     Review of Systems Review of Systems  Constitutional: Negative for fever, appetite change, fatigue and unexpected weight change.  Eyes: Negative for pain and visual disturbance.  Respiratory: Negative for wheeze  and shortness of breath.  pos for mild cough and R lateral cw pain  Cardiovascular: Negative for cp or palpitations    Gastrointestinal: Negative for nausea, diarrhea and constipation.  Genitourinary: Negative for urgency and frequency.  Skin: Negative for pallor or rash   Neurological: Negative for weakness, light-headedness, numbness and headaches.  Hematological: Negative for adenopathy. Does not bruise/bleed easily.  Psychiatric/Behavioral: Negative for dysphoric mood. The patient is not nervous/anxious.         Objective:   Physical Exam  Constitutional: She appears well-developed and well-nourished. No distress.  Frail / well appearing elderly female  HENT:  Head: Normocephalic and atraumatic.  Mouth/Throat: Oropharynx is clear and moist.  Eyes: Conjunctivae and EOM are normal. Pupils are equal, round, and reactive to light. Right eye exhibits no discharge. Left eye exhibits no discharge.  Neck: Normal range of motion. Neck supple. No JVD present.  Cardiovascular: Normal rate and regular rhythm.   Pulmonary/Chest: Effort normal and breath sounds normal. No respiratory distress. She has no wheezes. She has no rales. She exhibits tenderness.  Mild R lat chest wall tenderness w/o skin change or crepitus  Harsh bs at R base  No rales at all / no rhonchi or wheeze (improved exam)  Musculoskeletal: She exhibits no edema.  Lymphadenopathy:    She has no cervical adenopathy.  Neurological: She is alert.  Skin: Skin is warm and dry. No rash  noted. No erythema. No pallor.  Psychiatric:  Pleasant but a bit anxious today          Assessment & Plan:

## 2013-07-26 NOTE — Progress Notes (Signed)
Pre visit review using our clinic review tool, if applicable. No additional management support is needed unless otherwise documented below in the visit note. 

## 2013-07-26 NOTE — Patient Instructions (Signed)
I'm glad you are starting to feel better  Continue the levaquin Chest xray today - we will call you with a result  Exam is reassuring  Get rest and keep your fluid intake up

## 2013-07-27 NOTE — Assessment & Plan Note (Signed)
Clinical improvement is slow cxr today -pend reading  Reassuring exam-also imp  Consider referral to pulm if no further imp  Disc s/s to watch for -resp distress/ fever or no imp in 3-4 d

## 2013-07-31 ENCOUNTER — Telehealth: Payer: Self-pay

## 2013-07-31 DIAGNOSIS — J189 Pneumonia, unspecified organism: Secondary | ICD-10-CM

## 2013-07-31 NOTE — Telephone Encounter (Signed)
I did ref to pulm-Dr Joya Gaskins

## 2013-07-31 NOTE — Telephone Encounter (Signed)
Pt left v/m pt still has pain in right side and pt was to cb if continued pain and pt said she would like to see Dr Joya Gaskins as pulmonary specialist if Dr Glori Bickers wants to do referral.

## 2013-07-31 NOTE — Telephone Encounter (Signed)
Pt notified referral done and Marion/Linda will call to get appt scheduled

## 2013-08-04 ENCOUNTER — Other Ambulatory Visit (INDEPENDENT_AMBULATORY_CARE_PROVIDER_SITE_OTHER): Payer: Medicare Other

## 2013-08-04 ENCOUNTER — Ambulatory Visit (INDEPENDENT_AMBULATORY_CARE_PROVIDER_SITE_OTHER): Payer: Medicare Other | Admitting: Internal Medicine

## 2013-08-04 ENCOUNTER — Encounter: Payer: Self-pay | Admitting: Internal Medicine

## 2013-08-04 ENCOUNTER — Telehealth: Payer: Self-pay

## 2013-08-04 VITALS — BP 118/66 | HR 88 | Temp 97.7°F | Ht 63.0 in | Wt 123.0 lb

## 2013-08-04 DIAGNOSIS — J189 Pneumonia, unspecified organism: Secondary | ICD-10-CM | POA: Diagnosis not present

## 2013-08-04 DIAGNOSIS — R0789 Other chest pain: Secondary | ICD-10-CM

## 2013-08-04 LAB — CBC WITH DIFFERENTIAL/PLATELET
BASOS ABS: 0.1 10*3/uL (ref 0.0–0.1)
Basophils Relative: 1.2 % (ref 0.0–3.0)
EOS ABS: 0.2 10*3/uL (ref 0.0–0.7)
Eosinophils Relative: 2 % (ref 0.0–5.0)
HEMATOCRIT: 41.2 % (ref 36.0–46.0)
HEMOGLOBIN: 13.5 g/dL (ref 12.0–15.0)
Lymphocytes Relative: 15.2 % (ref 12.0–46.0)
Lymphs Abs: 1.5 10*3/uL (ref 0.7–4.0)
MCHC: 32.8 g/dL (ref 30.0–36.0)
MCV: 93.5 fl (ref 78.0–100.0)
MONO ABS: 0.7 10*3/uL (ref 0.1–1.0)
Monocytes Relative: 6.8 % (ref 3.0–12.0)
NEUTROS ABS: 7.6 10*3/uL (ref 1.4–7.7)
Neutrophils Relative %: 74.8 % (ref 43.0–77.0)
Platelets: 204 10*3/uL (ref 150.0–400.0)
RBC: 4.41 Mil/uL (ref 3.87–5.11)
RDW: 14.6 % (ref 11.5–14.6)
WBC: 10.2 10*3/uL (ref 4.5–10.5)

## 2013-08-04 LAB — BASIC METABOLIC PANEL
BUN: 18 mg/dL (ref 6–23)
CO2: 26 meq/L (ref 19–32)
Calcium: 9.8 mg/dL (ref 8.4–10.5)
Chloride: 101 mEq/L (ref 96–112)
Creatinine, Ser: 0.8 mg/dL (ref 0.4–1.2)
GFR: 69.68 mL/min (ref >60.00)
GLUCOSE: 205 mg/dL — AB (ref 70–99)
Potassium: 4.5 mEq/L (ref 3.5–5.1)
SODIUM: 136 meq/L (ref 135–145)

## 2013-08-04 LAB — SEDIMENTATION RATE: Sed Rate: 31 mm/hr — ABNORMAL HIGH (ref 0–22)

## 2013-08-04 NOTE — Patient Instructions (Addendum)
Please see patient coordinator before you leave today  to schedule CT chest   Please remember to go to the lab   department downstairs for your tests - we will call you with the results when they are available.  For pain try advil 200 mg take with meals as needed and if not effective after a few days try zostrix creasm applied 4 x per daily until better then just at bedtime.

## 2013-08-04 NOTE — Progress Notes (Signed)
   Subjective:    Patient ID: Lauren [name], female    DOB: 15-Oct-1927  MRN: [id number]  HPI   56 yowf never smoker never resp problem referred by Dr Thea Alken 08/04/2013 for eval of  Abn cxr ? Persistent pna?    08/04/2013 1st  Pulmonary office visit/ [facility] cc acutely ill with sore thoat mid Jan 2015 and fever treated as a strep throat but then developed cough/ R sided cp then dx'd pna >  All better now except for R post cp which is not pleuritic, different location entirely from prev acute pleuriticR lat chest pain, indolent in oset ,worse when moving around and awake and have not taking anything for it. Cough, sorethroat, fever all gone. Min doe.  No obvious other patterns in day to day or daytime variabilty or assoc chronic cough or cp or chest tightness, subjective wheeze overt sinus or hb symptoms. No unusual exp hx or h/o childhood pna/ asthma or knowledge of premature birth.  Sleeping ok without nocturnal  or early am exacerbation  of respiratory  c/o's or need for noct saba. Also denies any obvious fluctuation of symptoms with weather or environmental changes or other aggravating or alleviating factors except as outlined above   Current Medications, Allergies, Complete Past Medical History, Past Surgical History, Family History, and Social History were reviewed in [employer] record.            Review of Systems  Constitutional: Negative for fever and unexpected weight change.  HENT: Negative for congestion, dental problem, ear pain, nosebleeds, postnasal drip, rhinorrhea, sinus pressure, sneezing, sore throat and trouble swallowing.   Eyes: Negative for redness and itching.  Respiratory: Positive for chest tightness and shortness of breath. Negative for cough and wheezing.   Cardiovascular: Negative for palpitations and leg swelling.  Gastrointestinal: Negative for nausea and vomiting.  Genitourinary: Negative for dysuria.  Musculoskeletal: Negative  for joint swelling.  Skin: Negative for rash.  Neurological: Negative for headaches.  Hematological: Does not bruise/bleed easily.  Psychiatric/Behavioral: Negative for dysphoric mood. The patient is not nervous/anxious.        Objective:   Physical Exam  Wt Readings from Last 3 Encounters:  08/04/13 123 lb (55.792 kg)  07/26/13 120 lb 8 oz (54.658 kg)  07/21/13 124 lb (56.246 kg)      HEENT: nl dentition, turbinates, and orophanx. Nl external ear canals without cough reflex   NECK :  without JVD/Nodes/TM/ nl carotid upstrokes bilaterally   LUNGS: no acc muscle use, clear to A and P bilaterally without cough on insp or exp maneuvers   CV:  RRR  no s3 or murmur or increase in P2, no edema   ABD:  soft and nontender with nl excursion in the supine position. No bruits or organomegaly, bowel sounds nl  MS:  warm without deformities, calf tenderness, cyanosis or clubbing  SKIN: warm and dry without lesions    NEURO:  alert, approp, no deficits     cxr  07/26/13  Cardiomegaly with slight improvement in right middle lobe pneumonia  Labs 08/04/13 Esr 31, nl cbc, bmet     Assessment & Plan:

## 2013-08-04 NOTE — Telephone Encounter (Signed)
Message copied by Len Blalock on Fri Aug 04, 2013  2:56 PM ------      Message from: Christinia Gully B      Created: Fri Aug 04, 2013  1:21 PM       Call patient :  Studies are unremarkable, no change in recs ------

## 2013-08-04 NOTE — Telephone Encounter (Signed)
Spoke with pt's caregiver, she is aware of results and recs.  Nothing further needed.

## 2013-08-05 DIAGNOSIS — R0789 Other chest pain: Secondary | ICD-10-CM | POA: Insufficient documentation

## 2013-08-05 NOTE — Assessment & Plan Note (Addendum)
Resolving nicely but given persistent pain need to make sure no loculated / complex parapneumonic/pleural activity so rec CT chest to be complete,

## 2013-08-05 NOTE — Assessment & Plan Note (Addendum)
Post RCP most likely developed while coughing,   not pleuritic at all and developed in different location than the original and resolved R lat pleuritic pain and so best option is nsaids or zostrix  Will do chest ct to be complete since developed in setting on apparent RML pna but don't see any effusion at all and the RML is improving serially.

## 2013-08-07 ENCOUNTER — Ambulatory Visit (INDEPENDENT_AMBULATORY_CARE_PROVIDER_SITE_OTHER): Payer: Medicare Other | Admitting: Family Medicine

## 2013-08-07 ENCOUNTER — Encounter: Payer: Self-pay | Admitting: Family Medicine

## 2013-08-07 VITALS — BP 122/64 | HR 76 | Temp 98.4°F | Ht 63.0 in | Wt 123.5 lb

## 2013-08-07 DIAGNOSIS — J029 Acute pharyngitis, unspecified: Secondary | ICD-10-CM

## 2013-08-07 DIAGNOSIS — J020 Streptococcal pharyngitis: Secondary | ICD-10-CM

## 2013-08-07 LAB — POCT RAPID STREP A (OFFICE): Rapid Strep A Screen: POSITIVE — AB

## 2013-08-07 MED ORDER — AMOXICILLIN-POT CLAVULANATE 875-125 MG PO TABS: 1.0000 | ORAL_TABLET | Freq: Two times a day (BID) | ORAL | Status: DC

## 2013-08-07 NOTE — Assessment & Plan Note (Signed)
2nd episode in 3 mo Cover with augmentin (prev tx with amox)  Disc symptomatic care - see instructions on AVS  Update if not starting to improve in a week or if worsening   F/u in 4-6 weeks for throat cx when asymptomatic to test for cure

## 2013-08-07 NOTE — Progress Notes (Signed)
Pre visit review using our clinic review tool, if applicable. No additional management support is needed unless otherwise documented below in the visit note. 

## 2013-08-07 NOTE — Progress Notes (Signed)
Subjective:    Patient ID: Lauren [name], female    DOB: [birth year], 80 y.o.   MRN: [id number]  HPI Here for fever and ST  Rapid strep test positive today  Results for orders placed in visit on 08/07/13  POCT RAPID STREP A (OFFICE)      Result Value Ref Range   Rapid Strep A Screen Positive (*) Negative    Of note -she also had an episode of strep throat in January  ST started sat and then yesterday a severe sore throat -miserable  Has had a fever - 101.7  Came down today - advil  No rash   Felt quite poorly this am  Watching her closely   Patient Active Problem List   Diagnosis Date Noted  . Chest pain, musculoskeletal 08/05/2013  . Pneumonia involving right lung 07/12/2013  . Cough 06/27/2013  . SOB (shortness of breath) 06/27/2013  . Streptococcal sore throat 06/07/2013  . Encounter for Medicare annual wellness exam 04/04/2013  . Colon cancer screening 04/04/2013  . Abnormal TSH 04/04/2013  . Stress reaction 01/25/2013  . Leukocytosis, unspecified 01/12/2013  . Hypoglycemia 10/27/2012  . History of melanoma excision 09/27/2012  . History of breast cancer, left 01/08/2011  . Pelvic fracture 09/17/2010  . DEGENERATIVE JOINT DISEASE 02/13/2010  . MIGRAINE VARIANT 11/06/2009  . OSTEOPENIA 10/18/2008  . NEOPLASM, MALIGNANT, BREAST. left, T1b, N0, lumpectomy 10/25/2007. 09/10/2008  . TIA 09/10/2008  . DIABETES MELLITUS, TYPE II 10/06/2007  . VENOUS INSUFFICIENCY 10/06/2007  . DIVERTICULOSIS, COLON 10/06/2007  . FIBROCYSTIC BREAST DISEASE 10/06/2007  . HYPERCHOLESTEROLEMIA, PURE 11/29/2006   Past Medical History  Diagnosis Date  . Diabetes mellitus   . Gout     injection in the past  . Fibrocystic breast   . Venous insufficiency   . Hyperlipidemia   . TIA (transient ischemic attack) 08/2008    neg MRI/MRA and CT and carotid dopplers  . Chronic constipation   . Gastritis   . Chronic headaches   . Pelvic fracture   . Breast cancer     left breast  .  Melanoma in situ of left upper extremity    Past Surgical History  Procedure Laterality Date  . Breast cyst aspiration    . Eye surgery      retinal, then cataract  . Breast lumpectomy  09/2007    with sentinel LN biopsy  . Appendectomy    . Breast lumpectomy      left   History  Substance Use Topics  . Smoking status: Never Smoker   . Smokeless tobacco: Never Used  . Alcohol Use: No   Family History  Problem Relation Age of Onset  . Breast cancer Sister   . Stroke Sister   . Cancer Sister     breast  . Breast cancer Sister   . Stroke Sister   . Cancer Sister     breast  . Stroke Mother   . Stroke Father   . Stroke Brother   . Cancer Brother     prostate  . Stroke Brother   . Stroke Sister   . CAD Sister    Allergies  Allergen Reactions  . Aspirin     REACTION: GI  . Sulfonamide Derivatives     REACTION: itching   Current Outpatient Prescriptions on File Prior to Visit  Medication Sig Dispense Refill  . acetaminophen (TYLENOL) 500 MG tablet Take 500 mg by mouth every 6 (six) hours as needed for pain.       Marland Kitchen  Calcium-Vitamin D 600-200 MG-UNIT per tablet Take 1 tablet by mouth 2 (two) times daily.       . citalopram (CELEXA) 10 MG tablet TAKE ONE (1) TABLET BY MOUTH EVERY DAY  30 tablet  11  . clopidogrel (PLAVIX) 75 MG tablet TAKE ONE TABLET BY MOUTH EVERY MORNING WITH BREAKFAST  30 tablet  4  . fish oil-omega-3 fatty acids 1000 MG capsule Take 1 g by mouth daily.        Marland Kitchen loratadine (CLARITIN) 10 MG tablet Take 10 mg by mouth daily.      . Multiple Vitamins-Minerals (PRESERVISION AREDS PO) Take 1 tablet by mouth daily.      Marland Kitchen omeprazole (PRILOSEC) 40 MG capsule Take 40 mg by mouth daily.        . polyethylene glycol powder (GLYCOLAX/MIRALAX) powder DISSOLVE ONE (1) CAPFUL (17GM) INTO 4 TO8 OUNCES OF FLUID AND TAKE BYMOUTH ONCE DAILY AS DIRECTED  527 g  4  . Probiotic Product (PROBIOTIC DAILY PO) Take 1 tablet by mouth daily.      . simvastatin (ZOCOR) 20 MG  tablet TAKE ONE TABLET BY MOUTH EVERY NIGHT AT BEDTIME  30 tablet  4   No current facility-administered medications on file prior to visit.    Review of Systems    Review of Systems  Constitutional: pos for fever and malaise  Eyes: Negative for pain and visual disturbance.  ENT pos for ST and headache/ pos for some rhinorrhea Respiratory: Negative for wheeze and shortness of breath.  pos for mild cough from post nasal drip  Cardiovascular: Negative for cp or palpitations    Gastrointestinal: Negative for nausea, diarrhea and constipation.  Genitourinary: Negative for urgency and frequency.  Skin: Negative for pallor or rash   Neurological: Negative for weakness, light-headedness, numbness and headaches.  Hematological: Negative for adenopathy. Does not bruise/bleed easily.  Psychiatric/Behavioral: Negative for dysphoric mood. The patient is not nervous/anxious.      Objective:   Physical Exam  Constitutional: She appears well-developed and well-nourished. No distress.  HENT:  Head: Normocephalic and atraumatic.  Right Ear: External ear normal.  Left Ear: External ear normal.  Mouth/Throat: Oropharynx is clear and moist. No oropharyngeal exudate.  Nares are boggy with clear rhinorrhea Throat -erythema diffusely, with no exudate or swelling  No sinus tenderness   Eyes: Conjunctivae and EOM are normal. Pupils are equal, round, and reactive to light. Right eye exhibits no discharge. Left eye exhibits no discharge.  Neck: Normal range of motion. Neck supple.  Cardiovascular: Normal rate and regular rhythm.   Pulmonary/Chest: Effort normal and breath sounds normal. No respiratory distress. She has no wheezes. She has no rales.  Lymphadenopathy:    She has no cervical adenopathy.  Neurological: She is alert.  Skin: Skin is warm and dry. No rash noted.  Psychiatric: She has a normal mood and affect.          Assessment & Plan:

## 2013-08-07 NOTE — Patient Instructions (Signed)
Take the augmentin as directed for strep throat Gargle with salt water  Take ibuprofen with food as needed Update if not starting to improve in a week or if worsening   Follow up with me in 4-6 weeks when you are feeling better so I can do a throat culture and make sure this is resolved

## 2013-08-09 ENCOUNTER — Other Ambulatory Visit: Payer: Medicare Other

## 2013-08-14 ENCOUNTER — Telehealth: Payer: Self-pay

## 2013-08-14 ENCOUNTER — Encounter: Payer: Self-pay | Admitting: Internal Medicine

## 2013-08-14 ENCOUNTER — Ambulatory Visit (INDEPENDENT_AMBULATORY_CARE_PROVIDER_SITE_OTHER)
Admission: RE | Admit: 2013-08-14 | Discharge: 2013-08-14 | Disposition: A | Discharge status: Home / Self Care | Payer: Medicare Other | Source: Ambulatory Visit | Attending: Internal Medicine | Admitting: Internal Medicine

## 2013-08-14 ENCOUNTER — Other Ambulatory Visit: Payer: Medicare Other

## 2013-08-14 DIAGNOSIS — J189 Pneumonia, unspecified organism: Secondary | ICD-10-CM

## 2013-08-14 MED ORDER — IOHEXOL 350 MG/ML SOLN
80.0000 mL | Freq: Once | INTRAVENOUS | Status: AC | PRN
  Administered 2013-08-14: 80 mL via INTRAVENOUS

## 2013-08-14 NOTE — Telephone Encounter (Signed)
Pt aware of ct results and recs.  Nothing further needed at this time.

## 2013-08-14 NOTE — Progress Notes (Signed)
Quick Note:  LMTCB ______ 

## 2013-08-14 NOTE — Telephone Encounter (Signed)
Message copied by Len Blalock on Mon Aug 14, 2013 11:38 AM ------      Message from: Christinia Gully B      Created: Mon Aug 14, 2013 10:30 AM       Call patient :  Crist Fat is unremarkable, no change in recs ------

## 2013-08-15 ENCOUNTER — Telehealth: Payer: Self-pay | Admitting: Internal Medicine

## 2013-08-15 DIAGNOSIS — K219 Gastro-esophageal reflux disease without esophagitis: Secondary | ICD-10-CM | POA: Diagnosis not present

## 2013-08-15 DIAGNOSIS — R040 Epistaxis: Secondary | ICD-10-CM | POA: Diagnosis not present

## 2013-08-15 NOTE — Progress Notes (Signed)
Quick Note:  Spoke with pt. Informed her of results and recs per Dr. Melvyn Novas. She verbalized understanding. See phone msg from 08/15/13. ______

## 2013-08-15 NOTE — Telephone Encounter (Signed)
Lauren [name] was call pt for:  Notes Recorded by Tanda Rockers, MD on 08/14/2013 at 3:13 PM Call patient : Study does suggest some RML/lingular changes that will serial f/u here - repeat cxr only in 6 months but best bet is this has nothing to do with pain which is more likely musculoskeletal  ------  Called, spoke with pt. Informed her of above results and recs per Dr. Melvyn Novas.  She verbalized understanding and is aware we will call her in 6 months to remind her to come in for cxr.  She verbalized understanding and voiced no further questions or concerns at this time.  Note:  Per Lauren [name], Dr. Melvyn Novas has placed reminder for 6 month cxr reminder

## 2013-08-15 NOTE — Progress Notes (Signed)
Quick Note:  lmtcb ______ 

## 2013-08-15 NOTE — Telephone Encounter (Signed)
Pt is returning call.  

## 2013-08-16 ENCOUNTER — Ambulatory Visit
Admission: RE | Admit: 2013-08-16 | Discharge: 2013-08-16 | Disposition: A | Discharge status: Home / Self Care | Payer: Medicare Other | Source: Ambulatory Visit | Attending: Oncology | Admitting: Oncology

## 2013-08-16 DIAGNOSIS — R928 Other abnormal and inconclusive findings on diagnostic imaging of breast: Secondary | ICD-10-CM | POA: Diagnosis not present

## 2013-08-16 DIAGNOSIS — R921 Mammographic calcification found on diagnostic imaging of breast: Secondary | ICD-10-CM

## 2013-08-17 ENCOUNTER — Encounter: Payer: Self-pay | Admitting: Family Medicine

## 2013-09-06 ENCOUNTER — Encounter: Payer: Self-pay | Admitting: Family Medicine

## 2013-09-06 ENCOUNTER — Ambulatory Visit: Payer: Medicare Other | Admitting: Family Medicine

## 2013-09-06 ENCOUNTER — Ambulatory Visit (INDEPENDENT_AMBULATORY_CARE_PROVIDER_SITE_OTHER): Payer: Medicare Other | Admitting: Family Medicine

## 2013-09-06 VITALS — BP 126/74 | HR 82 | Temp 97.9°F | Ht 63.0 in | Wt 122.2 lb

## 2013-09-06 DIAGNOSIS — J020 Streptococcal pharyngitis: Secondary | ICD-10-CM | POA: Diagnosis not present

## 2013-09-06 NOTE — Progress Notes (Signed)
Subjective:    Patient ID: Lauren [name], female    DOB: 30-Nov-1927, 80 y.o.   MRN: [id number]  HPI  Here for f/u of streptococcal sore throat - after tx with augmentin  2nd occurrence Need cx today to make sure she is clear  No ST and no fever and she feels fine   Did see pulmonary for rib pain -has to re check a cxr in 6 mo for area seen in lingula but did adv that her pain was msk and not pulmonary- overall she is better from this also   Patient Active Problem List   Diagnosis Date Noted  . Chest pain, musculoskeletal 08/05/2013  . Pneumonia involving right lung 07/12/2013  . Cough 06/27/2013  . SOB (shortness of breath) 06/27/2013  . Streptococcal sore throat 06/07/2013  . Encounter for Medicare annual wellness exam 04/04/2013  . Colon cancer screening 04/04/2013  . Abnormal TSH 04/04/2013  . Stress reaction 01/25/2013  . Leukocytosis, unspecified 01/12/2013  . Hypoglycemia 10/27/2012  . History of melanoma excision 09/27/2012  . History of breast cancer, left 01/08/2011  . Pelvic fracture 09/17/2010  . DEGENERATIVE JOINT DISEASE 02/13/2010  . MIGRAINE VARIANT 11/06/2009  . OSTEOPENIA 10/18/2008  . NEOPLASM, MALIGNANT, BREAST. left, T1b, N0, lumpectomy 10/25/2007. 09/10/2008  . TIA 09/10/2008  . DIABETES MELLITUS, TYPE II 10/06/2007  . VENOUS INSUFFICIENCY 10/06/2007  . DIVERTICULOSIS, COLON 10/06/2007  . FIBROCYSTIC BREAST DISEASE 10/06/2007  . HYPERCHOLESTEROLEMIA, PURE 11/29/2006   Past Medical History  Diagnosis Date  . Diabetes mellitus   . Gout     injection in the past  . Fibrocystic breast   . Venous insufficiency   . Hyperlipidemia   . TIA (transient ischemic attack) 08/2008    neg MRI/MRA and CT and carotid dopplers  . Chronic constipation   . Gastritis   . Chronic headaches   . Pelvic fracture   . Breast cancer     left breast  . Melanoma in situ of left upper extremity    Past Surgical History  Procedure Laterality Date  . Breast cyst  aspiration    . Eye surgery      retinal, then cataract  . Breast lumpectomy  09/2007    with sentinel LN biopsy  . Appendectomy    . Breast lumpectomy      left   History  Substance Use Topics  . Smoking status: Never Smoker   . Smokeless tobacco: Never Used  . Alcohol Use: No   Family History  Problem Relation Age of Onset  . Breast cancer Sister   . Stroke Sister   . Cancer Sister     breast  . Breast cancer Sister   . Stroke Sister   . Cancer Sister     breast  . Stroke Mother   . Stroke Father   . Stroke Brother   . Cancer Brother     prostate  . Stroke Brother   . Stroke Sister   . CAD Sister    Allergies  Allergen Reactions  . Aspirin     REACTION: GI  . Sulfonamide Derivatives     REACTION: itching   Current Outpatient Prescriptions on File Prior to Visit  Medication Sig Dispense Refill  . Calcium-Vitamin D 600-200 MG-UNIT per tablet Take 1 tablet by mouth 2 (two) times daily.       . citalopram (CELEXA) 10 MG tablet TAKE ONE (1) TABLET BY MOUTH EVERY DAY  30 tablet  11  .  clopidogrel (PLAVIX) 75 MG tablet TAKE ONE TABLET BY MOUTH EVERY MORNING WITH BREAKFAST  30 tablet  4  . fish oil-omega-3 fatty acids 1000 MG capsule Take 1 g by mouth daily.        . Multiple Vitamins-Minerals (PRESERVISION AREDS PO) Take 1 tablet by mouth daily.      . polyethylene glycol powder (GLYCOLAX/MIRALAX) powder DISSOLVE ONE (1) CAPFUL (17GM) INTO 4 TO8 OUNCES OF FLUID AND TAKE BYMOUTH ONCE DAILY AS DIRECTED  527 g  4  . Probiotic Product (PROBIOTIC DAILY PO) Take 1 tablet by mouth daily.      . simvastatin (ZOCOR) 20 MG tablet TAKE ONE TABLET BY MOUTH EVERY NIGHT AT BEDTIME  30 tablet  4   No current facility-administered medications on file prior to visit.    Review of Systems Review of Systems  Constitutional: Negative for fever, appetite change, fatigue and unexpected weight change.  Eyes: Negative for pain and visual disturbance.  Respiratory: Negative for cough and  shortness of breath.   Cardiovascular: Negative for cp or palpitations    Gastrointestinal: Negative for nausea, diarrhea and constipation.  Genitourinary: Negative for urgency and frequency.  Skin: Negative for pallor or rash   Neurological: Negative for weakness, light-headedness, numbness and headaches.  Hematological: Negative for adenopathy. Does not bruise/bleed easily.  Psychiatric/Behavioral: Negative for dysphoric mood. The patient is not nervous/anxious.         Objective:   Physical Exam  Constitutional: She appears well-developed and well-nourished. No distress.  HENT:  Head: Normocephalic and atraumatic.  Nose: Nose normal.  Mouth/Throat: Oropharynx is clear and moist. No oropharyngeal exudate.  Throat is clear-no erythema or swelling   Eyes: Conjunctivae and EOM are normal. Pupils are equal, round, and reactive to light. Right eye exhibits no discharge. Left eye exhibits no discharge.  Neck: Normal range of motion. Neck supple.  Cardiovascular: Normal rate and regular rhythm.   Pulmonary/Chest: Effort normal and breath sounds normal. No respiratory distress. She has no wheezes. She has no rales. She exhibits no tenderness.  Lymphadenopathy:    She has no cervical adenopathy.  Neurological: She is alert.  Skin: Skin is warm and dry. No rash noted.  Psychiatric: She has a normal mood and affect.          Assessment & Plan:

## 2013-09-06 NOTE — Progress Notes (Signed)
Pre visit review using our clinic review tool, if applicable. No additional management support is needed unless otherwise documented below in the visit note. 

## 2013-09-06 NOTE — Patient Instructions (Signed)
We repeated your throat culture today to make sure that strep is totally gone  We will get in touch with you with a result If you develop any more sore throat / fever or other symptoms please call

## 2013-09-07 NOTE — Assessment & Plan Note (Signed)
Recurrent Today symptom free after augmentin - will cx throat to document clearance Will call if any throat symptoms develop

## 2013-09-08 LAB — CULTURE, GROUP A STREP: Organism ID, Bacteria: NORMAL

## 2013-09-21 ENCOUNTER — Telehealth: Payer: Self-pay | Admitting: Family Medicine

## 2013-09-21 NOTE — Telephone Encounter (Signed)
Patient Information:  Caller Name: [name]  Phone: [phone]  Patient: Lauren [name], Lauren [name]  Gender: Female  DOB: [birth year]  Age: 80 Years  PCP: [name] [facility])  Office Follow Up:  Does the office need to follow up with this patient?: No  Instructions For The Office: N/A  RN Note:  Accessed EMR and scheduled an appt. with Dr. Diona Browner for 09/22/13 for an evaluation of Lf. knee pain and swelling.  Symptoms  Reason For Call & Symptoms: Onset (09/21/13) of swollen area behind the  Lf. knee. No redness. Was painful this morning but not now. No injury. Pt. is able to walk only with a walker.  Does not normally need a walker.  Reviewed Health History In EMR: Yes  Reviewed Medications In EMR: Yes  Reviewed Allergies In EMR: Yes  Reviewed Surgeries / Procedures: Yes  Date of Onset of Symptoms: 09/21/2013  Treatments Tried: Hot packs and 2 Advils.  Treatments Tried Worked: Yes  Guideline(s) Used:  Knee Pain  Disposition Per Guideline:   See Within 3 Days in Office  Reason For Disposition Reached:   Swollen knee joint (no fever or redness)  Advice Given:  Knee Pain after Overuse:   Apply Heat to the Area: Beginning 48 hours after an injury, apply a warm washcloth or heating pad for 10 minutes three times a day to help increase blood flow and improve healing.  Rest Your Knee   for the next couple days. Avoid activities that worsen your pain. Reduce activities that put a lot of strain on the knee joint (e.g., deep knee bends, stair climbing, running).  Pain Medicines:  Acetaminophen (e.g., Tylenol):  Regular Strength Tylenol: Take 650 mg (two 325 mg pills) by mouth every 4-6 hours as needed. Each Regular Strength Tylenol pill has 325 mg of acetaminophen.  Expected Course:   If your knee pain does not get better during the next week or if it recurs, then you should make an appointment with your doctor.  Call Back If:  You become worse.  Patient Will Follow Care Advice:  YES  Appointment Scheduled:  09/22/2013 11:00:00 Appointment Scheduled Provider:  Eliezer Lofts [facility])

## 2013-09-22 ENCOUNTER — Encounter: Payer: Self-pay | Admitting: Family Medicine

## 2013-09-22 ENCOUNTER — Ambulatory Visit (INDEPENDENT_AMBULATORY_CARE_PROVIDER_SITE_OTHER): Payer: Medicare Other | Admitting: Family Medicine

## 2013-09-22 VITALS — BP 116/60 | HR 82 | Temp 98.2°F | Ht 63.0 in | Wt 123.8 lb

## 2013-09-22 DIAGNOSIS — M25569 Pain in unspecified knee: Secondary | ICD-10-CM | POA: Diagnosis not present

## 2013-09-22 DIAGNOSIS — M25562 Pain in left knee: Secondary | ICD-10-CM | POA: Insufficient documentation

## 2013-09-22 MED ORDER — MELOXICAM 7.5 MG PO TABS: 7.5000 mg | ORAL_TABLET | Freq: Every day | ORAL | Status: DC

## 2013-09-22 NOTE — Progress Notes (Signed)
Pre visit review using our clinic review tool, if applicable. No additional management support is needed unless otherwise documented below in the visit note. 

## 2013-09-22 NOTE — Assessment & Plan Note (Signed)
OA flare vs Baker's cyst.  Treat with low dose meloxicam, ice, elevation.  Already improving.  Follow up if improvement not continuing. No imagingas no recent fall.

## 2013-09-22 NOTE — Patient Instructions (Signed)
Use walker to get around gfor now. Start meloxicam 7.5mg  daily x next few days. Hold ibuprofen and aspirin. Can use tylenol if needed.  Elevate left leg when sitting. Home strengthening exercises. ICe knee.  Call PCP if not improving after 2 weeks.

## 2013-09-22 NOTE — Progress Notes (Signed)
   Subjective:    Patient ID: Lauren [name], female    DOB: 1927-09-29, 80 y.o.   MRN: [id number]  Knee Pain     80 year old female presents with new onset  left knee pain and swelling in last 2 days. No know injury or falls.  She did have knee bent cleaning out low cabinet for 30 min.  No history of knee issues. No past knee surgeries. No redness, some diffuse swelling.  Pain in posterior to knee.   Knee pain is gradually improving.  Using heat on it, using walker used 400 mg advil for pain.      Review of Systems  Constitutional: Negative for fever and fatigue.  HENT: Negative for ear pain.   Eyes: Negative for pain.  Respiratory: Negative for chest tightness and shortness of breath.   Cardiovascular: Negative for chest pain, palpitations and leg swelling.  Gastrointestinal: Negative for abdominal pain.  Genitourinary: Negative for dysuria.       Objective:   Physical Exam  Constitutional: Vital signs are normal. She appears well-developed and well-nourished. She is cooperative.  Non-toxic appearance. She does not appear ill. No distress.  HENT:  Head: Normocephalic.  Right Ear: Hearing, tympanic membrane, external ear and ear canal normal. Tympanic membrane is not erythematous, not retracted and not bulging.  Left Ear: Hearing, tympanic membrane, external ear and ear canal normal. Tympanic membrane is not erythematous, not retracted and not bulging.  Nose: No mucosal edema or rhinorrhea. Right sinus exhibits no maxillary sinus tenderness and no frontal sinus tenderness. Left sinus exhibits no maxillary sinus tenderness and no frontal sinus tenderness.  Mouth/Throat: Uvula is midline, oropharynx is clear and moist and mucous membranes are normal.  Eyes: Conjunctivae, EOM and lids are normal. Pupils are equal, round, and reactive to light. Lids are everted and swept, no foreign bodies found.  Neck: Trachea normal and normal range of motion. Neck supple. Carotid bruit is not  present. No mass and no thyromegaly present.  Cardiovascular: Normal rate, regular rhythm, S1 normal, S2 normal, normal heart sounds, intact distal pulses and normal pulses.  Exam reveals no gallop and no friction rub.   No murmur heard. Pulmonary/Chest: Effort normal and breath sounds normal. Not tachypneic. No respiratory distress. She has no decreased breath sounds. She has no wheezes. She has no rhonchi. She has no rales.  Abdominal: Soft. Normal appearance and bowel sounds are normal. There is no tenderness.  Musculoskeletal:       Left knee: She exhibits decreased range of motion and swelling. She exhibits no effusion, no bony tenderness and normal meniscus. Tenderness found. No medial joint line, no lateral joint line, no MCL, no LCL and no patellar tendon tenderness noted.  Possible baker's cyst , pain and swelling primarily at posterior knee. PAin only with flexion of knee  Neurological: She is alert.  Skin: Skin is warm, dry and intact. No rash noted.  Psychiatric: Her speech is normal and behavior is normal. Judgment and thought content normal. Her mood appears not anxious. Cognition and memory are normal. She does not exhibit a depressed mood.          Assessment & Plan:

## 2013-10-03 ENCOUNTER — Ambulatory Visit: Payer: Medicare Other | Admitting: Family Medicine

## 2013-10-12 ENCOUNTER — Telehealth: Payer: Self-pay | Admitting: Adult Health

## 2013-10-12 NOTE — Telephone Encounter (Signed)
, °

## 2013-10-13 ENCOUNTER — Other Ambulatory Visit: Payer: Medicare Other | Admitting: Lab

## 2013-10-13 ENCOUNTER — Ambulatory Visit: Payer: Medicare Other | Admitting: Adult Health

## 2013-10-13 ENCOUNTER — Telehealth: Payer: Self-pay | Admitting: Adult Health

## 2013-10-13 NOTE — Telephone Encounter (Signed)
, °

## 2013-10-18 ENCOUNTER — Ambulatory Visit: Payer: Medicare Other | Admitting: Adult Health

## 2013-10-18 ENCOUNTER — Other Ambulatory Visit: Payer: Medicare Other

## 2013-11-02 ENCOUNTER — Ambulatory Visit: Payer: Medicare Other | Admitting: Adult Health

## 2013-11-02 ENCOUNTER — Other Ambulatory Visit: Payer: Medicare Other

## 2013-11-07 ENCOUNTER — Telehealth: Payer: Self-pay | Admitting: *Deleted

## 2013-11-07 NOTE — Telephone Encounter (Signed)
Patient called concerned about her upcoming appt. since receiving the letter that Dr Humphrey Rolls has left the practice. Informed patient that she has an appt with Charlestine Massed, NP under guidance of Dr Jana Hakim. We will discuss her further primary Oncologist assignment with this visit. She had mixed findings on recent mammogram and needs repeat mammo in 4-6 months.  Again, reassured patient that we would be able to sit together and make a plan on her visit 11/21/2013.

## 2013-11-13 ENCOUNTER — Telehealth: Payer: Self-pay | Admitting: Adult Health

## 2013-11-13 ENCOUNTER — Other Ambulatory Visit: Payer: Self-pay | Admitting: Family Medicine

## 2013-11-13 NOTE — Telephone Encounter (Signed)
CALLED PT TO R/S 6/30 APPT (PER LC OK TO MOVE APPROX 1 MONTH). PT HAS CONCERNS ABOUT MOVING APT BECAUSE SHE GOT (2) SEPARATE NOTICES RE: MAMMO. 1ST SAID TO REPEAT IN 6MONTHS 2ND SAID TO REPEAT IN 4 MONTHS. PT SAYS SHE S/W RN ON 6/16 AND WAS ASSURED SHE WOULD SEE SOMEONE ON 6/30 TO DISCUSS WHAT SHE SHOULD DO ABOUT REPEAT MAMMO. ADVISED PT I WILL S/W LC AND CALL HER BACK TOMORROW.

## 2013-11-17 ENCOUNTER — Other Ambulatory Visit: Payer: Self-pay | Admitting: *Deleted

## 2013-11-17 DIAGNOSIS — C50919 Malignant neoplasm of unspecified site of unspecified female breast: Secondary | ICD-10-CM

## 2013-11-20 ENCOUNTER — Other Ambulatory Visit (HOSPITAL_BASED_OUTPATIENT_CLINIC_OR_DEPARTMENT_OTHER): Payer: Medicare Other

## 2013-11-20 ENCOUNTER — Telehealth: Payer: Self-pay | Admitting: Nurse Practitioner

## 2013-11-20 ENCOUNTER — Ambulatory Visit (HOSPITAL_BASED_OUTPATIENT_CLINIC_OR_DEPARTMENT_OTHER): Payer: Medicare Other | Admitting: Nurse Practitioner

## 2013-11-20 VITALS — BP 146/76 | HR 94 | Temp 98.3°F | Resp 18 | Ht 63.0 in | Wt 122.9 lb

## 2013-11-20 DIAGNOSIS — C50919 Malignant neoplasm of unspecified site of unspecified female breast: Secondary | ICD-10-CM

## 2013-11-20 DIAGNOSIS — Z79811 Long term (current) use of aromatase inhibitors: Secondary | ICD-10-CM | POA: Diagnosis not present

## 2013-11-20 DIAGNOSIS — Z170 Estrogen receptor positive status [ER+]: Secondary | ICD-10-CM | POA: Diagnosis not present

## 2013-11-20 DIAGNOSIS — N63 Unspecified lump in unspecified breast: Secondary | ICD-10-CM | POA: Diagnosis not present

## 2013-11-20 DIAGNOSIS — Z853 Personal history of malignant neoplasm of breast: Secondary | ICD-10-CM

## 2013-11-20 LAB — CBC WITH DIFFERENTIAL/PLATELET
BASO%: 1.3 % (ref 0.0–2.0)
BASOS ABS: 0.1 10*3/uL (ref 0.0–0.1)
EOS%: 3.3 % (ref 0.0–7.0)
Eosinophils Absolute: 0.3 10*3/uL (ref 0.0–0.5)
HCT: 44.1 % (ref 34.8–46.6)
HGB: 14.4 g/dL (ref 11.6–15.9)
LYMPH%: 17.9 % (ref 14.0–49.7)
MCH: 30.7 pg (ref 25.1–34.0)
MCHC: 32.7 g/dL (ref 31.5–36.0)
MCV: 93.7 fL (ref 79.5–101.0)
MONO#: 0.7 10*3/uL (ref 0.1–0.9)
MONO%: 7.5 % (ref 0.0–14.0)
NEUT#: 6.3 10*3/uL (ref 1.5–6.5)
NEUT%: 70 % (ref 38.4–76.8)
Platelets: 188 10*3/uL (ref 145–400)
RBC: 4.7 10*6/uL (ref 3.70–5.45)
RDW: 14.6 % — ABNORMAL HIGH (ref 11.2–14.5)
WBC: 8.9 10*3/uL (ref 3.9–10.3)
lymph#: 1.6 10*3/uL (ref 0.9–3.3)

## 2013-11-20 LAB — COMPREHENSIVE METABOLIC PANEL (CC13)
ALT: 12 U/L (ref 0–55)
ANION GAP: 11 meq/L (ref 3–11)
AST: 16 U/L (ref 5–34)
Albumin: 3.7 g/dL (ref 3.5–5.0)
Alkaline Phosphatase: 79 U/L (ref 40–150)
BUN: 14.3 mg/dL (ref 7.0–26.0)
CO2: 25 meq/L (ref 22–29)
CREATININE: 0.9 mg/dL (ref 0.6–1.1)
Calcium: 10.1 mg/dL (ref 8.4–10.4)
Chloride: 105 mEq/L (ref 98–109)
Glucose: 189 mg/dl — ABNORMAL HIGH (ref 70–140)
Potassium: 4.5 mEq/L (ref 3.5–5.1)
Sodium: 141 mEq/L (ref 136–145)
Total Bilirubin: 0.64 mg/dL (ref 0.20–1.20)
Total Protein: 7.6 g/dL (ref 6.4–8.3)

## 2013-11-20 NOTE — Progress Notes (Signed)
OFFICE PROGRESS NOTE  [place], MD [place] [address], [place] [address] Dr. Alphonsa Overall Dr. Gery Pray  DIAGNOSIS: 80 year old female with history of stage I (T1 B. N0) invasive ductal carcinoma diagnosed in 2009.  PRIOR THERAPY:  #1 patient underwent a lumpectomy of the left breast in June 2009. The final pathology revealed 0.8 cm invasive ductal carcinoma grade 1 with intermediate grade DCIS. 2 sentinel nodes were negative for metastatic disease. Lymphovascular invasion was identified.the tumor was ER +73% PR +93% Ki-67 10% HER-2/neu negative.  #2patient was offered but did not choose to receive radiation therapy by Dr. Gery Pray. She was seen in Multidisciplinary clinic on 11/09/2007. He told her she was low risk and could choose to have it or not. She then began Femara 2.5 mg June 2009 through June 2014.  CURRENT THERAPY: surveillance with annual mammogram and clinical breast exam  INTERVAL HISTORY: Lauren [name] 80 y.o. female returns for a follow up visit.  She has completed 5 years of anti-estrogen therapy.  She is feeling well.  She is due now for mammogram. She is accompanied by her niece.  Otherwise, she denies hot flashes, fevers, chills, weight loss, pain, or any other concerns.    MEDICAL HISTORY: Past Medical History  Diagnosis Date  . Diabetes mellitus   . Gout     injection in the past  . Fibrocystic breast   . Venous insufficiency   . Hyperlipidemia   . TIA (transient ischemic attack) 08/2008    neg MRI/MRA and CT and carotid dopplers  . Chronic constipation   . Gastritis   . Chronic headaches   . Pelvic fracture   . Breast cancer     left breast  . Melanoma in situ of left upper extremity     ALLERGIES:  is allergic to sulfonamide derivatives and aspirin.  MEDICATIONS:  Current Outpatient Prescriptions  Medication Sig Dispense Refill  . Calcium-Vitamin D 600-200 MG-UNIT per tablet Take 1 tablet by mouth 2  (two) times daily.       . citalopram (CELEXA) 10 MG tablet TAKE ONE (1) TABLET BY MOUTH EVERY DAY  30 tablet  11  . clopidogrel (PLAVIX) 75 MG tablet TAKE ONE TABLET BY MOUTH EVERY MORNING WITH BREAKFAST  30 tablet  0  . fish oil-omega-3 fatty acids 1000 MG capsule Take 1 g by mouth daily.        . meloxicam (MOBIC) 7.5 MG tablet Take 1-2 tablets (7.5-15 mg total) by mouth daily.  15 tablet  0  . Multiple Vitamins-Minerals (PRESERVISION AREDS PO) Take 1 tablet by mouth daily.      Marland Kitchen omeprazole (PRILOSEC) 20 MG capsule Take 20 mg by mouth daily.      . polyethylene glycol powder (GLYCOLAX/MIRALAX) powder DISSOLVE ONE (1) CAPFUL (17GM) INTO 4 TO8 OUNCES OF FLUID AND TAKE BYMOUTH ONCE DAILY AS DIRECTED  527 g  4  . Probiotic Product (PROBIOTIC DAILY PO) Take 1 tablet by mouth daily.      . simvastatin (ZOCOR) 20 MG tablet TAKE ONE TABLET BY MOUTH EVERY NIGHT AT BEDTIME  30 tablet  4   No current facility-administered medications for this visit.    SURGICAL HISTORY:  Past Surgical History  Procedure Laterality Date  . Breast cyst aspiration    . Eye surgery      retinal, then cataract  . Breast lumpectomy  09/2007    with sentinel LN biopsy  .  Appendectomy    . Breast lumpectomy      left    REVIEW OF SYSTEMS:  Pertinent items are noted in HPI.   HEALTH MAINTENANCE:   PHYSICAL EXAMINATION: Blood pressure 146/76, pulse 94, temperature 98.3 F (36.8 C), temperature source Oral, resp. rate 18, height $RemoveBe'5\' 3"'ULVQCxygb$  (1.6 m), weight 122 lb 14.4 oz (55.747 kg). Body mass index is 21.78 kg/(m^2). General: Patient is a well appearing female in no acute distress HEENT: PERRLA, sclerae anicteric no conjunctival pallor, MMM Neck: supple, no palpable adenopathy Lungs: clear to auscultation bilaterally, no wheezes, rhonchi, or rales Cardiovascular: regular rate rhythm, S1, S2, no murmurs, rubs or gallops Abdomen: Soft, non-tender, non-distended, normoactive bowel sounds, no HSM Extremities: warm and  well perfused, no clubbing, cyanosis, or edema Skin: No rashes or lesions Neuro: Non-focal Breasts: Patient has a well-healed scar no nodularity or masses. Right breast no masses or nipple discharge.Left breast has thickened area underneath scar; pt cannot recall if this is new or not.  ECOG PERFORMANCE STATUS: 0 - Asymptomatic  LABORATORY DATA: Lab Results  Component Value Date   WBC 8.9 11/20/2013   HGB 14.4 11/20/2013   HCT 44.1 11/20/2013   MCV 93.7 11/20/2013   PLT 188 11/20/2013      Chemistry      Component Value Date/Time   NA 141 11/20/2013 0958   NA 136 08/04/2013 1109   K 4.5 11/20/2013 0958   K 4.5 08/04/2013 1109   CL 101 08/04/2013 1109   CO2 25 11/20/2013 0958   CO2 26 08/04/2013 1109   BUN 14.3 11/20/2013 0958   BUN 18 08/04/2013 1109   CREATININE 0.9 11/20/2013 0958   CREATININE 0.8 08/04/2013 1109      Component Value Date/Time   CALCIUM 10.1 11/20/2013 0958   CALCIUM 9.8 08/04/2013 1109   ALKPHOS 79 11/20/2013 0958   ALKPHOS 75 03/28/2013 0813   AST 16 11/20/2013 0958   AST 19 03/28/2013 0813   ALT 12 11/20/2013 0958   ALT 16 03/28/2013 0813   BILITOT 0.64 11/20/2013 0958   BILITOT 0.6 03/28/2013 0813       RADIOGRAPHIC STUDIES:  No results found.  ASSESSMENT: 80 year old female with  #1 stage I invasive ductal carcinoma of the left breast that was ER positive PR positive HER-2/neu negative. She underwent a lumpectomy in 2009 followed by radiation and then begun on Femara 2.5 mg daily she is tolerating it well.  #2 new area of concern in the left breast.  Mammogram/ultrasound are currently due and were ordered today. Patient is reassured today. She states she stopped doing self breast exams and its highly possible this thickened area is scar tissue.   PLAN:  #1 She has completed 5 years of Femara and is due for a mammogram now. We have ordered one with attention to thickening in left breast.     #2 she will be seen back in 1 year.   All questions were answered.  The patient knows to call the clinic with any problems, questions or concerns. We can certainly see the patient much sooner if necessary.  I spent 25 minutes counseling the patient face to face. The total time spent in the appointment was 30 minutes.  [place]urns [place], [place] Phone: [phone] 11/20/2013, 4:39 PM

## 2013-11-20 NOTE — Telephone Encounter (Signed)
per pof to sch pt mamma-appt-gave pt appts and printed pt sch

## 2013-11-21 ENCOUNTER — Other Ambulatory Visit: Payer: Medicare Other

## 2013-11-21 ENCOUNTER — Ambulatory Visit: Payer: Medicare Other | Admitting: Adult Health

## 2013-11-21 ENCOUNTER — Telehealth: Payer: Self-pay | Admitting: Hematology and Oncology

## 2013-11-21 ENCOUNTER — Other Ambulatory Visit: Payer: Self-pay | Admitting: Family Medicine

## 2013-11-21 ENCOUNTER — Encounter: Payer: Self-pay | Admitting: *Deleted

## 2013-11-21 NOTE — Telephone Encounter (Signed)
per 6/30 pof moved mammo from 8/4 to 7/9. called pt and s/w care giver Nellie re new appt d/t for mammo 7/9 @ 3:30pm. Nellie given appt d/t/location. no other orders.

## 2013-11-21 NOTE — Telephone Encounter (Signed)
Aware-please refill for 6 mo

## 2013-11-21 NOTE — Telephone Encounter (Signed)
done

## 2013-11-21 NOTE — Telephone Encounter (Signed)
Received refill request electronically. See drug interaction with Omeprazole and Clopidogrel. Is it okay to refill medication?

## 2013-11-28 ENCOUNTER — Ambulatory Visit (INDEPENDENT_AMBULATORY_CARE_PROVIDER_SITE_OTHER): Payer: Medicare Other | Admitting: Family Medicine

## 2013-11-28 ENCOUNTER — Encounter: Payer: Self-pay | Admitting: Family Medicine

## 2013-11-28 VITALS — BP 114/58 | HR 81 | Temp 97.6°F | Ht 63.0 in | Wt 124.0 lb

## 2013-11-28 DIAGNOSIS — E7800 Pure hypercholesterolemia, unspecified: Secondary | ICD-10-CM | POA: Diagnosis not present

## 2013-11-28 DIAGNOSIS — E119 Type 2 diabetes mellitus without complications: Secondary | ICD-10-CM

## 2013-11-28 MED ORDER — GLUCOSE BLOOD VI STRP: ORAL_STRIP | Status: DC

## 2013-11-28 NOTE — Progress Notes (Signed)
Subjective:    Patient ID: Lauren [name], female    DOB: May 15, 1928, 80 y.o.   MRN: [id number]  HPI Here for f/u of chronic health problems   Things have been more quiet -now - that is better  Generalized lack of energy as she gets older - wiped out by regular tasks - but better than the winter   Had her breast cancer f/u -- watching a thickened area on L breast - will check with dx mammo in Aug  She is not overly anxious about that    BP Readings from Last 3 Encounters:  11/28/13 114/58  11/20/13 146/76  09/22/13 116/60    Wt is up 2 lb with bmi of 21 Says she is eating enough - eating well  Appetite is good overall      Chemistry      Component Value Date/Time   NA 141 11/20/2013 0958   NA 136 08/04/2013 1109   K 4.5 11/20/2013 0958   K 4.5 08/04/2013 1109   CL 101 08/04/2013 1109   CO2 25 11/20/2013 0958   CO2 26 08/04/2013 1109   BUN 14.3 11/20/2013 0958   BUN 18 08/04/2013 1109   CREATININE 0.9 11/20/2013 0958   CREATININE 0.8 08/04/2013 1109      Component Value Date/Time   CALCIUM 10.1 11/20/2013 0958   CALCIUM 9.8 08/04/2013 1109   ALKPHOS 79 11/20/2013 0958   ALKPHOS 75 03/28/2013 0813   AST 16 11/20/2013 0958   AST 19 03/28/2013 0813   ALT 12 11/20/2013 0958   ALT 16 03/28/2013 0813   BILITOT 0.64 11/20/2013 0958   BILITOT 0.6 03/28/2013 0813      Gluc in 180s  Lab Results  Component Value Date   HGBA1C 7.3* 06/27/2013    She was on abx at the time  Sugars are running well - 129 or less - at different times  Eating well/ DM diet and stays as active as she can  No symptoms or low glucose readings  Eye exam in the fall   Lab Results  Component Value Date   CHOL 188 03/28/2013   HDL 45.90 03/28/2013   LDLCALC 113* 03/28/2013   LDLDIRECT 46.3 10/01/2009   TRIG 145.0 03/28/2013   CHOLHDL 4 03/28/2013    zocor and diet -has been fairly controlled   Patient Active Problem List   Diagnosis Date Noted  . Left knee pain 09/22/2013  . Chest pain, musculoskeletal  08/05/2013  . Pneumonia involving right lung 07/12/2013  . Cough 06/27/2013  . SOB (shortness of breath) 06/27/2013  . Streptococcal sore throat 06/07/2013  . Encounter for Medicare annual wellness exam 04/04/2013  . Colon cancer screening 04/04/2013  . Abnormal TSH 04/04/2013  . Stress reaction 01/25/2013  . Leukocytosis, unspecified 01/12/2013  . Hypoglycemia 10/27/2012  . History of melanoma excision 09/27/2012  . History of breast cancer, left 01/08/2011  . Pelvic fracture 09/17/2010  . DEGENERATIVE JOINT DISEASE 02/13/2010  . MIGRAINE VARIANT 11/06/2009  . OSTEOPENIA 10/18/2008  . NEOPLASM, MALIGNANT, BREAST. left, T1b, N0, lumpectomy 10/25/2007. 09/10/2008  . TIA 09/10/2008  . DIABETES MELLITUS, TYPE II 10/06/2007  . VENOUS INSUFFICIENCY 10/06/2007  . DIVERTICULOSIS, COLON 10/06/2007  . FIBROCYSTIC BREAST DISEASE 10/06/2007  . HYPERCHOLESTEROLEMIA, PURE 11/29/2006   Past Medical History  Diagnosis Date  . Diabetes mellitus   . Gout     injection in the past  . Fibrocystic breast   . Venous insufficiency   . Hyperlipidemia   .  TIA (transient ischemic attack) 08/2008    neg MRI/MRA and CT and carotid dopplers  . Chronic constipation   . Gastritis   . Chronic headaches   . Pelvic fracture   . Breast cancer     left breast  . Melanoma in situ of left upper extremity    Past Surgical History  Procedure Laterality Date  . Breast cyst aspiration    . Eye surgery      retinal, then cataract  . Breast lumpectomy  09/2007    with sentinel LN biopsy  . Appendectomy    . Breast lumpectomy      left   History  Substance Use Topics  . Smoking status: Never Smoker   . Smokeless tobacco: Never Used  . Alcohol Use: No   Family History  Problem Relation Age of Onset  . Breast cancer Sister   . Stroke Sister   . Cancer Sister     breast  . Breast cancer Sister   . Stroke Sister   . Cancer Sister     breast  . Stroke Mother   . Stroke Father   . Stroke Brother    . Cancer Brother     prostate  . Stroke Brother   . Stroke Sister   . CAD Sister    Allergies  Allergen Reactions  . Sulfonamide Derivatives Itching  . Aspirin Other (See Comments)    Burning and pain in stomach   Current Outpatient Prescriptions on File Prior to Visit  Medication Sig Dispense Refill  . Calcium-Vitamin D 600-200 MG-UNIT per tablet Take 1 tablet by mouth 2 (two) times daily.       . citalopram (CELEXA) 10 MG tablet TAKE ONE (1) TABLET BY MOUTH EVERY DAY  30 tablet  11  . clopidogrel (PLAVIX) 75 MG tablet TAKE ONE TABLET BY MOUTH EVERY MORNING WITH BREAKFAST  30 tablet  5  . fish oil-omega-3 fatty acids 1000 MG capsule Take 1 g by mouth daily.        . Multiple Vitamins-Minerals (PRESERVISION AREDS PO) Take 1 tablet by mouth daily.      Marland Kitchen omeprazole (PRILOSEC) 20 MG capsule Take 20 mg by mouth daily.      . polyethylene glycol powder (GLYCOLAX/MIRALAX) powder DISSOLVE ONE (1) CAPFUL (17GM) INTO 4 TO8 OUNCES OF FLUID AND TAKE BYMOUTH ONCE DAILY AS DIRECTED  527 g  4  . Probiotic Product (PROBIOTIC DAILY PO) Take 1 tablet by mouth daily.      . simvastatin (ZOCOR) 20 MG tablet TAKE ONE TABLET BY MOUTH EVERY NIGHT AT BEDTIME  30 tablet  4   No current facility-administered medications on file prior to visit.    Review of Systems Review of Systems  Constitutional: Negative for fever, appetite change, fatigue and unexpected weight change.  Eyes: Negative for pain and visual disturbance.  Respiratory: Negative for cough and shortness of breath.   Cardiovascular: Negative for cp or palpitations    Gastrointestinal: Negative for nausea, diarrhea and constipation.  Genitourinary: Negative for urgency and frequency. neg for excessive thirst or urination  Skin: Negative for pallor or rash   MSK pos for arthritis stiffness/aches and pains  Neurological: Negative for weakness, light-headedness, numbness and headaches.  Hematological: Negative for adenopathy. Does not  bruise/bleed easily.  Psychiatric/Behavioral: Negative for dysphoric mood. The patient is not nervous/anxious.         Objective:   Physical Exam  Constitutional: She appears well-developed and well-nourished. No distress.  Elderly female, well appearing   HENT:  Head: Normocephalic and atraumatic.  Mouth/Throat: Oropharynx is clear and moist.  Eyes: Conjunctivae and EOM are normal. Pupils are equal, round, and reactive to light. Right eye exhibits no discharge. Left eye exhibits no discharge. No scleral icterus.  Neck: Normal range of motion. Neck supple. No JVD present. Carotid bruit is not present. No thyromegaly present.  Cardiovascular: Normal rate, regular rhythm and intact distal pulses.  Exam reveals no gallop.   Varicosities noted  Pulmonary/Chest: Effort normal and breath sounds normal. No respiratory distress. She has no wheezes. She has no rales.  No crackles   Musculoskeletal: She exhibits no edema.  Lymphadenopathy:    She has no cervical adenopathy.  Neurological: She is alert. She has normal reflexes. No cranial nerve deficit. She exhibits normal muscle tone. Coordination normal.  Skin: Skin is warm and dry. No rash noted.  Psychiatric: She has a normal mood and affect.          Assessment & Plan:   Problem List Items Addressed This Visit     Endocrine   DIABETES MELLITUS, TYPE II - Primary      Lab Results  Component Value Date   HGBA1C 7.3* 06/27/2013    Glucose readings at home are improved overall  Diet is good/ wt is stable and no symptoms  Will re check in 6 mo  opthy utd       Other   HYPERCHOLESTEROLEMIA, PURE     Disc goals for lipids and reasons to control them Fair control - LDL is not quite at goal but I am not eager to adv therapy at this time Rev labs with pt from the fall  Rev low sat fat diet in detail

## 2013-11-28 NOTE — Progress Notes (Signed)
Pre visit review using our clinic review tool, if applicable. No additional management support is needed unless otherwise documented below in the visit note. 

## 2013-11-28 NOTE — Patient Instructions (Signed)
Follow up after Apr 04, 2014 for annual exam with labs prior  Take care of yourself  I'm glad you are doing well

## 2013-11-29 NOTE — Assessment & Plan Note (Signed)
Disc goals for lipids and reasons to control them Fair control - LDL is not quite at goal but I am not eager to adv therapy at this time Rev labs with pt from the fall  Rev low sat fat diet in detail

## 2013-11-29 NOTE — Assessment & Plan Note (Signed)
Lab Results  Component Value Date   HGBA1C 7.3* 06/27/2013    Glucose readings at home are improved overall  Diet is good/ wt is stable and no symptoms  Will re check in 6 mo  opthy utd

## 2013-12-08 ENCOUNTER — Telehealth: Payer: Self-pay

## 2013-12-08 NOTE — Telephone Encounter (Signed)
Since I do not know what brand is covered -I wrote a generic px with inst (brand that is covered) In IN box

## 2013-12-08 NOTE — Telephone Encounter (Signed)
[facility] cannot fill Medicare Part B and test strip rx was transferred to [facility]; [facility] said cannot accept transfer on Part B diabetic supplies and needs new rx sent for diabetic test strips which has to include name of specific test strip and dx code. Spoke with pt and she is using [employer] glucose Research officer, trade union at [employer] said no longer can get [employer] diabetic supplies. Request new meter, test strips and lancets sent to [facility]. Call pt when sent in.

## 2013-12-08 NOTE — Telephone Encounter (Signed)
Rx faxed

## 2013-12-19 ENCOUNTER — Other Ambulatory Visit: Payer: Self-pay | Admitting: Family Medicine

## 2013-12-26 ENCOUNTER — Other Ambulatory Visit: Payer: Self-pay | Admitting: Nurse Practitioner

## 2013-12-26 ENCOUNTER — Ambulatory Visit
Admission: RE | Admit: 2013-12-26 | Discharge: 2013-12-26 | Disposition: A | Discharge status: Home / Self Care | Payer: Medicare Other | Source: Ambulatory Visit | Attending: Nurse Practitioner | Admitting: Nurse Practitioner

## 2013-12-26 ENCOUNTER — Encounter (INDEPENDENT_AMBULATORY_CARE_PROVIDER_SITE_OTHER): Payer: Self-pay

## 2013-12-26 DIAGNOSIS — N6320 Unspecified lump in the left breast, unspecified quadrant: Secondary | ICD-10-CM

## 2013-12-26 DIAGNOSIS — Z853 Personal history of malignant neoplasm of breast: Secondary | ICD-10-CM

## 2013-12-26 DIAGNOSIS — R928 Other abnormal and inconclusive findings on diagnostic imaging of breast: Secondary | ICD-10-CM | POA: Diagnosis not present

## 2013-12-26 DIAGNOSIS — C50919 Malignant neoplasm of unspecified site of unspecified female breast: Secondary | ICD-10-CM

## 2013-12-26 DIAGNOSIS — N6489 Other specified disorders of breast: Secondary | ICD-10-CM | POA: Diagnosis not present

## 2013-12-28 ENCOUNTER — Other Ambulatory Visit: Payer: Self-pay | Admitting: Dermatology

## 2013-12-28 DIAGNOSIS — C4360 Malignant melanoma of unspecified upper limb, including shoulder: Secondary | ICD-10-CM | POA: Diagnosis not present

## 2013-12-28 DIAGNOSIS — Z85820 Personal history of malignant melanoma of skin: Secondary | ICD-10-CM | POA: Diagnosis not present

## 2013-12-28 DIAGNOSIS — I781 Nevus, non-neoplastic: Secondary | ICD-10-CM | POA: Diagnosis not present

## 2013-12-28 DIAGNOSIS — L739 Follicular disorder, unspecified: Secondary | ICD-10-CM | POA: Diagnosis not present

## 2013-12-28 DIAGNOSIS — D239 Other benign neoplasm of skin, unspecified: Secondary | ICD-10-CM | POA: Diagnosis not present

## 2013-12-28 DIAGNOSIS — D2360 Other benign neoplasm of skin of unspecified upper limb, including shoulder: Secondary | ICD-10-CM | POA: Diagnosis not present

## 2013-12-28 DIAGNOSIS — D485 Neoplasm of uncertain behavior of skin: Secondary | ICD-10-CM | POA: Diagnosis not present

## 2013-12-28 DIAGNOSIS — Z85828 Personal history of other malignant neoplasm of skin: Secondary | ICD-10-CM | POA: Diagnosis not present

## 2013-12-28 DIAGNOSIS — L821 Other seborrheic keratosis: Secondary | ICD-10-CM | POA: Diagnosis not present

## 2014-01-16 ENCOUNTER — Other Ambulatory Visit: Payer: Self-pay | Admitting: Dermatology

## 2014-01-16 DIAGNOSIS — C4360 Malignant melanoma of unspecified upper limb, including shoulder: Secondary | ICD-10-CM | POA: Diagnosis not present

## 2014-01-16 DIAGNOSIS — Z85828 Personal history of other malignant neoplasm of skin: Secondary | ICD-10-CM | POA: Diagnosis not present

## 2014-01-20 ENCOUNTER — Other Ambulatory Visit: Payer: Self-pay | Admitting: Family Medicine

## 2014-02-07 ENCOUNTER — Ambulatory Visit (INDEPENDENT_AMBULATORY_CARE_PROVIDER_SITE_OTHER): Payer: Medicare Other

## 2014-02-07 DIAGNOSIS — Z23 Encounter for immunization: Secondary | ICD-10-CM

## 2014-02-28 ENCOUNTER — Ambulatory Visit (INDEPENDENT_AMBULATORY_CARE_PROVIDER_SITE_OTHER): Payer: Medicare Other | Admitting: Ophthalmology

## 2014-02-28 DIAGNOSIS — H43813 Vitreous degeneration, bilateral: Secondary | ICD-10-CM

## 2014-02-28 DIAGNOSIS — E11329 Type 2 diabetes mellitus with mild nonproliferative diabetic retinopathy without macular edema: Secondary | ICD-10-CM

## 2014-02-28 DIAGNOSIS — E11311 Type 2 diabetes mellitus with unspecified diabetic retinopathy with macular edema: Secondary | ICD-10-CM | POA: Diagnosis not present

## 2014-03-26 ENCOUNTER — Other Ambulatory Visit: Payer: Self-pay | Admitting: Family Medicine

## 2014-03-30 DIAGNOSIS — H01001 Unspecified blepharitis right upper eyelid: Secondary | ICD-10-CM | POA: Diagnosis not present

## 2014-03-30 DIAGNOSIS — H26492 Other secondary cataract, left eye: Secondary | ICD-10-CM | POA: Diagnosis not present

## 2014-03-30 DIAGNOSIS — E11329 Type 2 diabetes mellitus with mild nonproliferative diabetic retinopathy without macular edema: Secondary | ICD-10-CM | POA: Diagnosis not present

## 2014-03-30 DIAGNOSIS — H3531 Nonexudative age-related macular degeneration: Secondary | ICD-10-CM | POA: Diagnosis not present

## 2014-03-30 LAB — HM DIABETES EYE EXAM

## 2014-04-02 ENCOUNTER — Encounter: Payer: Self-pay | Admitting: Internal Medicine

## 2014-04-02 ENCOUNTER — Ambulatory Visit (INDEPENDENT_AMBULATORY_CARE_PROVIDER_SITE_OTHER): Payer: Medicare Other | Admitting: Internal Medicine

## 2014-04-02 VITALS — BP 120/80 | HR 83 | Temp 98.9°F | Wt 127.0 lb

## 2014-04-02 DIAGNOSIS — J069 Acute upper respiratory infection, unspecified: Secondary | ICD-10-CM

## 2014-04-02 DIAGNOSIS — B9789 Other viral agents as the cause of diseases classified elsewhere: Secondary | ICD-10-CM

## 2014-04-02 NOTE — Patient Instructions (Signed)
Please try tea with honey, cough drops and acetaminophen.(tylenol). If you are getting worse, like coughing up a lot of mucus as the week goes on, call and I will start an antibiotic.  Upper Respiratory Infection, Adult An upper respiratory infection (URI) is also sometimes known as the common cold. The upper respiratory tract includes the nose, sinuses, throat, trachea, and bronchi. Bronchi are the airways leading to the lungs. Most people improve within 1 week, but symptoms can last up to 2 weeks. A residual cough may last even longer.  CAUSES Many different viruses can infect the tissues lining the upper respiratory tract. The tissues become irritated and inflamed and often become very moist. Mucus production is also common. A cold is contagious. You can easily spread the virus to others by oral contact. This includes kissing, sharing a glass, coughing, or sneezing. Touching your mouth or nose and then touching a surface, which is then touched by another person, can also spread the virus. SYMPTOMS  Symptoms typically develop 1 to 3 days after you come in contact with a cold virus. Symptoms vary from person to person. They may include:  Runny nose.  Sneezing.  Nasal congestion.  Sinus irritation.  Sore throat.  Loss of voice (laryngitis).  Cough.  Fatigue.  Muscle aches.  Loss of appetite.  Headache.  Low-grade fever. DIAGNOSIS  You might diagnose your own cold based on familiar symptoms, since most people get a cold 2 to 3 times a year. Your caregiver can confirm this based on your exam. Most importantly, your caregiver can check that your symptoms are not due to another disease such as strep throat, sinusitis, pneumonia, asthma, or epiglottitis. Blood tests, throat tests, and X-rays are not necessary to diagnose a common cold, but they may sometimes be helpful in excluding other more serious diseases. Your caregiver will decide if any further tests are required. RISKS AND  COMPLICATIONS  You may be at risk for a more severe case of the common cold if you smoke cigarettes, have chronic heart disease (such as heart failure) or lung disease (such as asthma), or if you have a weakened immune system. The very young and very old are also at risk for more serious infections. Bacterial sinusitis, middle ear infections, and bacterial pneumonia can complicate the common cold. The common cold can worsen asthma and chronic obstructive pulmonary disease (COPD). Sometimes, these complications can require emergency medical care and may be life-threatening. PREVENTION  The best way to protect against getting a cold is to practice good hygiene. Avoid oral or hand contact with people with cold symptoms. Wash your hands often if contact occurs. There is no clear evidence that vitamin C, vitamin E, echinacea, or exercise reduces the chance of developing a cold. However, it is always recommended to get plenty of rest and practice good nutrition. TREATMENT  Treatment is directed at relieving symptoms. There is no cure. Antibiotics are not effective, because the infection is caused by a virus, not by bacteria. Treatment may include:  Increased fluid intake. Sports drinks offer valuable electrolytes, sugars, and fluids.  Breathing heated mist or steam (vaporizer or shower).  Eating chicken soup or other clear broths, and maintaining good nutrition.  Getting plenty of rest.  Using gargles or lozenges for comfort.  Controlling fevers with ibuprofen or acetaminophen as directed by your caregiver.  Increasing usage of your inhaler if you have asthma. Zinc gel and zinc lozenges, taken in the first 24 hours of the common cold, can shorten  the duration and lessen the severity of symptoms. Pain medicines may help with fever, muscle aches, and throat pain. A variety of non-prescription medicines are available to treat congestion and runny nose. Your caregiver can make recommendations and may  suggest nasal or lung inhalers for other symptoms.  HOME CARE INSTRUCTIONS   Only take over-the-counter or prescription medicines for pain, discomfort, or fever as directed by your caregiver.  Use a warm mist humidifier or inhale steam from a shower to increase air moisture. This may keep secretions moist and make it easier to breathe.  Drink enough water and fluids to keep your urine clear or pale yellow.  Rest as needed.  Return to work when your temperature has returned to normal or as your caregiver advises. You may need to stay home longer to avoid infecting others. You can also use a face mask and careful hand washing to prevent spread of the virus. SEEK MEDICAL CARE IF:   After the first few days, you feel you are getting worse rather than better.  You need your caregiver's advice about medicines to control symptoms.  You develop chills, worsening shortness of breath, or brown or red sputum. These may be signs of pneumonia.  You develop yellow or brown nasal discharge or pain in the face, especially when you bend forward. These may be signs of sinusitis.  You develop a fever, swollen neck glands, pain with swallowing, or white areas in the back of your throat. These may be signs of strep throat. SEEK IMMEDIATE MEDICAL CARE IF:   You have a fever.  You develop severe or persistent headache, ear pain, sinus pain, or chest pain.  You develop wheezing, a prolonged cough, cough up blood, or have a change in your usual mucus (if you have chronic lung disease).  You develop sore muscles or a stiff neck. Document Released: 11/04/2000 Document Revised: 08/03/2011 Document Reviewed: 08/16/2013 [facility] Patient Information 2015 [place], [place]. This information is not intended to replace advice given to you by your health care provider. Make sure you discuss any questions you have with your health care provider.

## 2014-04-02 NOTE — Progress Notes (Signed)
Pre visit review using our clinic review tool, if applicable. No additional management support is needed unless otherwise documented below in the visit note. 

## 2014-04-02 NOTE — Progress Notes (Signed)
Subjective:    Patient ID: ABBIEGAIL [name], female    DOB: [birth year], 80 y.o.   MRN: [id number]  HPI Having a cough Scratchy feeling in throat Just started last night and coughed all night Has had congestion for a while--in nose and sinuses (like weeks)  No fall allergies that she knows of No clear fever No sweats or chills Non productive cough---just due to drainage from her sinuses  Gets sense of SOB when she coughs hard  No meds for this Tried cough drops  Current Outpatient Prescriptions on File Prior to Visit  Medication Sig Dispense Refill  . Calcium-Vitamin D 600-200 MG-UNIT per tablet Take 1 tablet by mouth 2 (two) times daily.     . citalopram (CELEXA) 10 MG tablet TAKE ONE (1) TABLET BY MOUTH EVERY DAY 30 tablet 11  . clopidogrel (PLAVIX) 75 MG tablet TAKE ONE TABLET BY MOUTH EVERY MORNING WITH BREAKFAST 30 tablet 5  . fish oil-omega-3 fatty acids 1000 MG capsule Take 1 g by mouth daily.      Marland Kitchen glucose blood test strip Check blood glucose daily and as needed for diabetes type 2 (250.00) 100 each 3  . Multiple Vitamins-Minerals (PRESERVISION AREDS PO) Take 1 tablet by mouth daily.    Marland Kitchen omeprazole (PRILOSEC) 20 MG capsule Take 20 mg by mouth daily.    . polyethylene glycol powder (GLYCOLAX/MIRALAX) powder DISSOLVE ONE (1) CAPFUL (17GM) INTO 4 TO8 OUNCES OF FLUID AND TAKE BYMOUTH ONCE DAILY AS DIRECTED 527 g 1  . Probiotic Product (PROBIOTIC DAILY PO) Take 1 tablet by mouth daily.    . simvastatin (ZOCOR) 20 MG tablet TAKE ONE TABLET BY MOUTH EVERY NIGHT AT BEDTIME 30 tablet 1   No current facility-administered medications on file prior to visit.    Allergies  Allergen Reactions  . Sulfonamide Derivatives Itching  . Aspirin Other (See Comments)    Burning and pain in stomach    Past Medical History  Diagnosis Date  . Diabetes mellitus   . Gout     injection in the past  . Fibrocystic breast   . Venous insufficiency   . Hyperlipidemia   . TIA (transient  ischemic attack) 08/2008    neg MRI/MRA and CT and carotid dopplers  . Chronic constipation   . Gastritis   . Chronic headaches   . Pelvic fracture   . Breast cancer     left breast  . Melanoma in situ of left upper extremity     Past Surgical History  Procedure Laterality Date  . Breast cyst aspiration    . Eye surgery      retinal, then cataract  . Breast lumpectomy  09/2007    with sentinel LN biopsy  . Appendectomy    . Breast lumpectomy      left    Family History  Problem Relation Age of Onset  . Breast cancer Sister   . Stroke Sister   . Cancer Sister     breast  . Breast cancer Sister   . Stroke Sister   . Cancer Sister     breast  . Stroke Mother   . Stroke Father   . Stroke Brother   . Cancer Brother     prostate  . Stroke Brother   . Stroke Sister   . CAD Sister     History   Social History  . Marital Status: Single    Spouse Name: N/A    Number of Children: N/A  .  Years of Education: N/A   Occupational History  . Not on file.   Social History Main Topics  . Smoking status: Never Smoker   . Smokeless tobacco: Never Used  . Alcohol Use: No  . Drug Use: No  . Sexual Activity: Not Currently   Other Topics Concern  . Not on file   Social History Narrative   Lives with and cares for both sisters   Strong faith   Review of Systems No vomiting or diarrhea Appetite is okay No rash    Objective:   Physical Exam  Constitutional: She appears well-developed and well-nourished. No distress.  Coarse cough  HENT:  Mouth/Throat: Oropharynx is clear and moist. No oropharyngeal exudate.  No sinus tenderness TMs normal Mild nasal congestion  Neck: Normal range of motion. Neck supple. No thyromegaly present.  Pulmonary/Chest: Effort normal. No respiratory distress. She has no wheezes. She has no rales.  Slight rhonchi  Lymphadenopathy:    She has no cervical adenopathy.  Skin: No rash noted.          Assessment & Plan:

## 2014-04-02 NOTE — Assessment & Plan Note (Signed)
Some nasal congestion for a while but not sick Now with 1-2 days of drainage and scratchy throat May have some bronchitis with rhonchi but no signs of pneumonia Discussed supportive care If worsens, would try empiric antibiotic

## 2014-04-03 ENCOUNTER — Encounter: Payer: Self-pay | Admitting: Family Medicine

## 2014-04-05 ENCOUNTER — Telehealth: Payer: Self-pay

## 2014-04-05 MED ORDER — AMOXICILLIN 500 MG PO TABS: 1000.0000 mg | ORAL_TABLET | Freq: Two times a day (BID) | ORAL | Status: DC

## 2014-04-05 NOTE — Telephone Encounter (Signed)
Please let them know I sent a prescription for an antibiotic to [facility] for her

## 2014-04-05 NOTE — Telephone Encounter (Signed)
Pt niece called back to check on status of this request: I informed her rx was called into [facility].

## 2014-04-05 NOTE — Telephone Encounter (Signed)
[name] pts niece left v/m; pt was seen 04/02/14; now pt has constant prod cough with yellow phlegm; pt has 100.5 fever with Tylenol. When pt blows nose has yellow mucus. Linda request cb. [place].

## 2014-04-23 ENCOUNTER — Telehealth: Payer: Self-pay | Admitting: Family Medicine

## 2014-04-23 DIAGNOSIS — E119 Type 2 diabetes mellitus without complications: Secondary | ICD-10-CM

## 2014-04-23 DIAGNOSIS — M8580 Other specified disorders of bone density and structure, unspecified site: Secondary | ICD-10-CM

## 2014-04-23 DIAGNOSIS — E7800 Pure hypercholesterolemia, unspecified: Secondary | ICD-10-CM

## 2014-04-23 NOTE — Telephone Encounter (Signed)
-----   Message from Ellamae Sia sent at 04/18/2014 10:27 AM EST ----- Regarding: Lab orders for Tuesday, 12.1.15 Patient is scheduled for CPX labs, please order future labs, Thanks , Karna Christmas

## 2014-04-24 ENCOUNTER — Other Ambulatory Visit (INDEPENDENT_AMBULATORY_CARE_PROVIDER_SITE_OTHER): Payer: Medicare Other

## 2014-04-24 DIAGNOSIS — M859 Disorder of bone density and structure, unspecified: Secondary | ICD-10-CM | POA: Diagnosis not present

## 2014-04-24 DIAGNOSIS — E119 Type 2 diabetes mellitus without complications: Secondary | ICD-10-CM | POA: Diagnosis not present

## 2014-04-24 DIAGNOSIS — E7800 Pure hypercholesterolemia, unspecified: Secondary | ICD-10-CM

## 2014-04-24 DIAGNOSIS — M8580 Other specified disorders of bone density and structure, unspecified site: Secondary | ICD-10-CM

## 2014-04-24 LAB — COMPREHENSIVE METABOLIC PANEL
ALT: 13 U/L (ref 0–35)
AST: 17 U/L (ref 0–37)
Albumin: 3.7 g/dL (ref 3.5–5.2)
Alkaline Phosphatase: 72 U/L (ref 39–117)
BILIRUBIN TOTAL: 0.8 mg/dL (ref 0.2–1.2)
BUN: 20 mg/dL (ref 6–23)
CHLORIDE: 106 meq/L (ref 96–112)
CO2: 26 meq/L (ref 19–32)
Calcium: 9.7 mg/dL (ref 8.4–10.5)
Creatinine, Ser: 0.8 mg/dL (ref 0.4–1.2)
GFR: 68.61 mL/min (ref >60.00)
Glucose, Bld: 126 mg/dL — ABNORMAL HIGH (ref 70–99)
Potassium: 4.5 mEq/L (ref 3.5–5.1)
SODIUM: 141 meq/L (ref 135–145)
Total Protein: 6.9 g/dL (ref 6.0–8.3)

## 2014-04-24 LAB — CBC WITH DIFFERENTIAL/PLATELET
BASOS ABS: 0.1 10*3/uL (ref 0.0–0.1)
Basophils Relative: 0.9 % (ref 0.0–3.0)
Eosinophils Absolute: 0.3 10*3/uL (ref 0.0–0.7)
Eosinophils Relative: 2.9 % (ref 0.0–5.0)
HCT: 42.4 % (ref 36.0–46.0)
Hemoglobin: 13.7 g/dL (ref 12.0–15.0)
LYMPHS ABS: 1.9 10*3/uL (ref 0.7–4.0)
LYMPHS PCT: 20 % (ref 12.0–46.0)
MCHC: 32.3 g/dL (ref 30.0–36.0)
MCV: 93.8 fl (ref 78.0–100.0)
MONOS PCT: 6.7 % (ref 3.0–12.0)
Monocytes Absolute: 0.6 10*3/uL (ref 0.1–1.0)
Neutro Abs: 6.6 10*3/uL (ref 1.4–7.7)
Neutrophils Relative %: 69.5 % (ref 43.0–77.0)
PLATELETS: 204 10*3/uL (ref 150.0–400.0)
RBC: 4.52 Mil/uL (ref 3.87–5.11)
RDW: 14.4 % (ref 11.5–15.5)
WBC: 9.6 10*3/uL (ref 4.0–10.5)

## 2014-04-24 LAB — VITAMIN D 25 HYDROXY (VIT D DEFICIENCY, FRACTURES): VITD: 50.13 ng/mL (ref 30.00–100.00)

## 2014-04-24 LAB — LIPID PANEL
Cholesterol: 191 mg/dL (ref 0–200)
HDL: 29.3 mg/dL — ABNORMAL LOW (ref >39.00)
NONHDL: 161.7
Total CHOL/HDL Ratio: 7
Triglycerides: 233 mg/dL — ABNORMAL HIGH (ref 0.0–149.0)
VLDL: 46.6 mg/dL — ABNORMAL HIGH (ref 0.0–40.0)

## 2014-04-24 LAB — LDL CHOLESTEROL, DIRECT: Direct LDL: 76.2 mg/dL

## 2014-04-24 LAB — HEMOGLOBIN A1C: Hgb A1c MFr Bld: 7.6 % — ABNORMAL HIGH (ref 4.6–6.5)

## 2014-04-24 LAB — TSH: TSH: 4.46 u[IU]/mL (ref 0.35–4.50)

## 2014-05-01 ENCOUNTER — Ambulatory Visit (INDEPENDENT_AMBULATORY_CARE_PROVIDER_SITE_OTHER): Payer: Medicare Other | Admitting: Family Medicine

## 2014-05-01 ENCOUNTER — Ambulatory Visit (INDEPENDENT_AMBULATORY_CARE_PROVIDER_SITE_OTHER)
Admission: RE | Admit: 2014-05-01 | Discharge: 2014-05-01 | Disposition: A | Discharge status: Home / Self Care | Payer: Medicare Other | Source: Ambulatory Visit | Attending: Family Medicine | Admitting: Family Medicine

## 2014-05-01 ENCOUNTER — Encounter: Payer: Self-pay | Admitting: Family Medicine

## 2014-05-01 ENCOUNTER — Telehealth: Payer: Self-pay | Admitting: Family Medicine

## 2014-05-01 VITALS — BP 118/66 | HR 79 | Temp 98.2°F | Ht 62.5 in | Wt 125.5 lb

## 2014-05-01 DIAGNOSIS — E7800 Pure hypercholesterolemia, unspecified: Secondary | ICD-10-CM

## 2014-05-01 DIAGNOSIS — E2839 Other primary ovarian failure: Secondary | ICD-10-CM | POA: Diagnosis not present

## 2014-05-01 DIAGNOSIS — Z1211 Encounter for screening for malignant neoplasm of colon: Secondary | ICD-10-CM

## 2014-05-01 DIAGNOSIS — R918 Other nonspecific abnormal finding of lung field: Secondary | ICD-10-CM | POA: Diagnosis not present

## 2014-05-01 DIAGNOSIS — E119 Type 2 diabetes mellitus without complications: Secondary | ICD-10-CM

## 2014-05-01 DIAGNOSIS — Z0000 Encounter for general adult medical examination without abnormal findings: Secondary | ICD-10-CM | POA: Diagnosis not present

## 2014-05-01 DIAGNOSIS — R058 Other specified cough: Secondary | ICD-10-CM

## 2014-05-01 DIAGNOSIS — M8580 Other specified disorders of bone density and structure, unspecified site: Secondary | ICD-10-CM

## 2014-05-01 DIAGNOSIS — R05 Cough: Secondary | ICD-10-CM | POA: Insufficient documentation

## 2014-05-01 DIAGNOSIS — Z23 Encounter for immunization: Secondary | ICD-10-CM

## 2014-05-01 MED ORDER — AZITHROMYCIN 250 MG PO TABS: ORAL_TABLET | ORAL | Status: DC

## 2014-05-01 NOTE — Patient Instructions (Addendum)
prevnar vaccine today  Chest xray today as well  Stop at check out to schedule bone density test  Start checking glucose when you feel shaky (to see if it is low) and also 2 hours after lunch , also at bedtime  Then follow up with me in about a month with your glucose readings  Please do stool card for colon cancer screening    Call us with the exact name brand of strips and lancets you need so I can fax to the pharmacy

## 2014-05-01 NOTE — Progress Notes (Signed)
Pre visit review using our clinic review tool, if applicable. No additional management support is needed unless otherwise documented below in the visit note. 

## 2014-05-01 NOTE — Telephone Encounter (Signed)
Dr Melvyn Novas was not overly impressed with cxr findings in comparison to the last one  He recommended empiric coverage with zpack and then f/u with him if no improvement  Please call it in

## 2014-05-01 NOTE — Progress Notes (Signed)
Subjective:    Patient ID: Lauren [name], female    DOB: Dec 22, 1927, 80 y.o.   MRN: [id number]  HPI Here for annual medicare wellness visit as well as acute and chronic health problems  Wt is down 1.5 lb with bmi of 22  I have personally reviewed the Medicare Annual Wellness questionnaire and have noted 1. The patient's medical and social history 2. Their use of alcohol, tobacco or illicit drugs 3. Their current medications and supplements 4. The patient's functional ability including ADL's, fall risks, home safety risks and hearing or visual             impairment. 5. Diet and physical activities 6. Evidence for depression or mood disorders  The patients weight, height, BMI have been recorded in the chart and visual acuity is per eye clinic.  I have made referrals, counseling and provided education to the patient based review of the above and I have provided the pt with a written personalized care plan for preventive services.  Some abd pain RUQ/ also R side and back Also recently had a cold/ still coughing  Is prone to pneumonia   Feeling ok overall  Getting shaky if she does not eat around 10:30 am - she carries crackers/pb with her   See scanned forms.  Routine anticipatory guidance given to patient.  See health maintenance. Colon cancer screening- wants to do IFOB card / aged out for colonosc  Breast cancer screening mammogram 8/15 - 1 year recall (had checked calcifications)- is a breast cancer survivor Self breast exam -no lumps or changes  Flu vaccine 9/15 Tetanus vaccine 6/06  Pneumovax 5/10 , will give prevnar today  Zoster vaccine 8/14  Advance directive-has a living will and POA  Cognitive function addressed- see scanned forms- and if abnormal then additional documentation follows. -overall thinks memory is good for her age   PMH and [place] reviewed  Meds, vitals, and allergies reviewed.   ROS: See HPI.  Otherwise negative.    Diabetes Home sugar results -  is having some hypoglycemia at about 10:30  110-103 fasting in am , not checking at other times, she does not check when she gets shaky DM diet - she thinks this is about the same - but she is eating some sweets  Exercise - less since the weather cooled down  Symptoms-shaky with  ? Hypoglycemia  A1C last  Lab Results  Component Value Date   HGBA1C 7.6* 04/24/2014    No diabetic medications  Last eye exam up to date -has retinopathy  Osteopenia  7/13 dexa  Not on any meds for that  Takes ca plus D bid  D level is good 50  Wants to go ahead and order it  Off tamoxifen or meds like it now- was on 5 years    Hyperlipidemia  On simvastatin Lab Results  Component Value Date   CHOL 191 04/24/2014   CHOL 188 03/28/2013   CHOL 134 10/28/2012   Lab Results  Component Value Date   HDL 29.30* 04/24/2014   HDL 45.90 03/28/2013   HDL 46 10/28/2012   Lab Results  Component Value Date   LDLCALC 113* 03/28/2013   LDLCALC 59 10/28/2012   LDLCALC 64 03/21/2012   Lab Results  Component Value Date   TRIG 233.0* 04/24/2014   TRIG 145.0 03/28/2013   TRIG 143 10/28/2012   Lab Results  Component Value Date   CHOLHDL 7 04/24/2014   CHOLHDL 4 03/28/2013  CHOLHDL 2.9 10/28/2012   Lab Results  Component Value Date   LDLDIRECT 76.2 04/24/2014   LDLDIRECT 46.3 10/01/2009   HDL is way down  Perhaps a lot less exercise in the past year  Has had rough year with illness- less active   Patient Active Problem List   Diagnosis Date Noted  . URI (upper respiratory infection) 04/02/2014  . Left knee pain 09/22/2013  . Chest pain, musculoskeletal 08/05/2013  . Pneumonia involving right lung 07/12/2013  . Streptococcal sore throat 06/07/2013  . Encounter for Medicare annual wellness exam 04/04/2013  . Colon cancer screening 04/04/2013  . Hypoglycemia 10/27/2012  . History of melanoma excision 09/27/2012  . History of breast cancer, left 01/08/2011  . Pelvic fracture 09/17/2010  .  DEGENERATIVE JOINT DISEASE 02/13/2010  . MIGRAINE VARIANT 11/06/2009  . Osteopenia 10/18/2008  . NEOPLASM, MALIGNANT, BREAST. left, T1b, N0, lumpectomy 10/25/2007. 09/10/2008  . TIA 09/10/2008  . Diabetes type 2, controlled 10/06/2007  . VENOUS INSUFFICIENCY 10/06/2007  . DIVERTICULOSIS, COLON 10/06/2007  . FIBROCYSTIC BREAST DISEASE 10/06/2007  . HYPERCHOLESTEROLEMIA, PURE 11/29/2006   Past Medical History  Diagnosis Date  . Diabetes mellitus   . Gout     injection in the past  . Fibrocystic breast   . Venous insufficiency   . Hyperlipidemia   . TIA (transient ischemic attack) 08/2008    neg MRI/MRA and CT and carotid dopplers  . Chronic constipation   . Gastritis   . Chronic headaches   . Pelvic fracture   . Breast cancer     left breast  . Melanoma in situ of left upper extremity    Past Surgical History  Procedure Laterality Date  . Breast cyst aspiration    . Eye surgery      retinal, then cataract  . Breast lumpectomy  09/2007    with sentinel LN biopsy  . Appendectomy    . Breast lumpectomy      left   History  Substance Use Topics  . Smoking status: Never Smoker   . Smokeless tobacco: Never Used  . Alcohol Use: No   Family History  Problem Relation Age of Onset  . Breast cancer Sister   . Stroke Sister   . Cancer Sister     breast  . Breast cancer Sister   . Stroke Sister   . Cancer Sister     breast  . Stroke Mother   . Stroke Father   . Stroke Brother   . Cancer Brother     prostate  . Stroke Brother   . Stroke Sister   . CAD Sister    Allergies  Allergen Reactions  . Sulfonamide Derivatives Itching  . Aspirin Other (See Comments)    Burning and pain in stomach   Current Outpatient Prescriptions on File Prior to Visit  Medication Sig Dispense Refill  . Calcium-Vitamin D 600-200 MG-UNIT per tablet Take 1 tablet by mouth 2 (two) times daily.     . citalopram (CELEXA) 10 MG tablet TAKE ONE (1) TABLET BY MOUTH EVERY DAY 30 tablet 11  .  clopidogrel (PLAVIX) 75 MG tablet TAKE ONE TABLET BY MOUTH EVERY MORNING WITH BREAKFAST 30 tablet 5  . fish oil-omega-3 fatty acids 1000 MG capsule Take 1 g by mouth daily.      Marland Kitchen glucose blood test strip Check blood glucose daily and as needed for diabetes type 2 (250.00) 100 each 3  . Multiple Vitamins-Minerals (PRESERVISION AREDS PO) Take 2 tablets by  mouth daily.     Marland Kitchen omeprazole (PRILOSEC) 20 MG capsule Take 20 mg by mouth daily.    . polyethylene glycol powder (GLYCOLAX/MIRALAX) powder DISSOLVE ONE (1) CAPFUL (17GM) INTO 4 TO8 OUNCES OF FLUID AND TAKE BYMOUTH ONCE DAILY AS DIRECTED 527 g 1  . Probiotic Product (PROBIOTIC DAILY PO) Take 1 tablet by mouth daily.    . simvastatin (ZOCOR) 20 MG tablet TAKE ONE TABLET BY MOUTH EVERY NIGHT AT BEDTIME 30 tablet 1   No current facility-administered medications on file prior to visit.    Review of Systems Review of Systems  Constitutional: Negative for fever, appetite change, fatigue and unexpected weight change.  Eyes: Negative for pain and visual disturbance.  Respiratory: Negative for wheeze  and shortness of breath.   Cardiovascular: Negative for cp or palpitations    Gastrointestinal: Negative for nausea, diarrhea and constipation.  Genitourinary: Negative for urgency and frequency.  Skin: Negative for pallor or rash   Neurological: Negative for weakness, light-headedness, numbness and headaches.  Hematological: Negative for adenopathy. Does not bruise/bleed easily.  Psychiatric/Behavioral: Negative for dysphoric mood. The patient is not nervous/anxious.         Objective:   Physical Exam  Constitutional: She appears well-developed and well-nourished. No distress.  HENT:  Head: Normocephalic and atraumatic.  Right Ear: External ear normal.  Left Ear: External ear normal.  Mouth/Throat: Oropharynx is clear and moist.  Eyes: Conjunctivae and EOM are normal. Pupils are equal, round, and reactive to light. No scleral icterus.  Neck:  Normal range of motion. Neck supple. No JVD present. Carotid bruit is not present. No thyromegaly present.  Cardiovascular: Normal rate, regular rhythm, normal heart sounds and intact distal pulses.  Exam reveals no gallop.   Pulmonary/Chest: Effort normal. No respiratory distress. She has no wheezes. She has rales. She exhibits no tenderness.  Some scant rales in R lower lung field   Abdominal: Soft. Bowel sounds are normal. She exhibits no distension, no abdominal bruit and no mass. There is no tenderness.  Abdomen is protuberant/ tympanic (gassy) as usual  Genitourinary: No breast swelling, tenderness, discharge or bleeding.  Breast exam: No mass, nodules, thickening, tenderness, bulging, retraction, inflamation, nipple discharge or skin changes noted.  No axillary or clavicular LA.      Musculoskeletal: Normal range of motion. She exhibits no edema or tenderness.  Lymphadenopathy:    She has no cervical adenopathy.  Neurological: She is alert. She has normal reflexes. No cranial nerve deficit. She exhibits normal muscle tone. Coordination normal.  Skin: Skin is warm and dry. No rash noted. No erythema. No pallor.  SKs and lentigos diffusely  Psychiatric: She has a normal mood and affect.  Mildly anxious           Assessment & Plan:   Problem List Items Addressed This Visit      Endocrine   Diabetes type 2, controlled    Lab Results  Component Value Date   HGBA1C 7.6* 04/24/2014   This is up despite report of symptoms of hypoglycemia Disc testing -will test more often during the day incl when symptomatic and the after review-make a plan Will limit sweets also        Musculoskeletal and Integument   Osteopenia    Due for dexa No falls or recent fx Disc need for calcium/ vitamin D/ wt bearing exercise and bone density test every 2 y to monitor Disc safety/ fracture risk in detail  Other   Colon cancer screening    Due to age will not adv colonosc for screen    IFOB card given    Relevant Orders      Fecal occult blood, imunochemical   Encounter for Medicare annual wellness exam - Primary    Reviewed health habits including diet and exercise and skin cancer prevention Reviewed appropriate screening tests for age  Also reviewed health mt list, fam hx and immunization status , as well as social and family history   See HPI Labs rev  prevnar vaccine today IFOB stool card dexa scheduled     Estrogen deficiency    Due for dexa Will refer     Relevant Orders      DG Bone Density   HYPERCHOLESTEROLEMIA, PURE    Disc goals for lipids and reasons to control them Rev labs with pt Rev low sat fat diet in detail  Will continue zocor and diet     Productive cough    CXR today She also has the return of pain in R mid back and flank - area of prev pneumonia  Has had CT of area and followed by pulmonary      Relevant Orders      DG Chest 2 View (Completed)    Other Visit Diagnoses    Need for vaccination with 13-polyvalent pneumococcal conjugate vaccine        Relevant Orders       Pneumococcal conjugate vaccine 13-valent (Completed)

## 2014-05-02 MED ORDER — AZITHROMYCIN 250 MG PO TABS: ORAL_TABLET | ORAL | Status: DC

## 2014-05-02 NOTE — Telephone Encounter (Signed)
It is probably what she has / virus/ respiratory infection  Watch for fever or sob - keep me posted

## 2014-05-02 NOTE — Telephone Encounter (Signed)
Linda notified of pt's xray results and Dr. Marliss Coots comments. Rx sent to pharmacy.  [name] wanted me to let Dr. Glori Bickers know that pt isn't feeling good today, pt woke up with a HA that felt like pressure in her head, and also said her throat felt funny but she can breath okay with no SOB. [name] said pt is feeling really bad and wanted to know if you thought this was a side eff of the prevnar vaccine or another problem, please advise

## 2014-05-02 NOTE — Telephone Encounter (Signed)
Linda notified of Dr. Marliss Coots comments/recommendations

## 2014-05-03 ENCOUNTER — Other Ambulatory Visit (INDEPENDENT_AMBULATORY_CARE_PROVIDER_SITE_OTHER): Payer: Medicare Other

## 2014-05-03 DIAGNOSIS — Z1211 Encounter for screening for malignant neoplasm of colon: Secondary | ICD-10-CM

## 2014-05-03 LAB — FECAL OCCULT BLOOD, IMMUNOCHEMICAL: FECAL OCCULT BLD: NEGATIVE

## 2014-05-03 LAB — FECAL OCCULT BLOOD, GUAIAC: Fecal Occult Blood: NEGATIVE

## 2014-05-03 NOTE — Assessment & Plan Note (Signed)
Due for dexa No falls or recent fx Disc need for calcium/ vitamin D/ wt bearing exercise and bone density test every 2 y to monitor Disc safety/ fracture risk in detail

## 2014-05-03 NOTE — Assessment & Plan Note (Signed)
Reviewed health habits including diet and exercise and skin cancer prevention Reviewed appropriate screening tests for age  Also reviewed health mt list, fam hx and immunization status , as well as social and family history   See HPI Labs rev  prevnar vaccine today IFOB stool card dexa scheduled

## 2014-05-03 NOTE — Assessment & Plan Note (Signed)
Due to age will not adv colonosc for screen  IFOB card given

## 2014-05-03 NOTE — Assessment & Plan Note (Signed)
Due for dexa Will refer

## 2014-05-03 NOTE — Assessment & Plan Note (Signed)
Disc goals for lipids and reasons to control them Rev labs with pt Rev low sat fat diet in detail  Will continue zocor and diet

## 2014-05-03 NOTE — Assessment & Plan Note (Signed)
Lab Results  Component Value Date   HGBA1C 7.6* 04/24/2014   This is up despite report of symptoms of hypoglycemia Disc testing -will test more often during the day incl when symptomatic and the after review-make a plan Will limit sweets also

## 2014-05-03 NOTE — Assessment & Plan Note (Signed)
CXR today She also has the return of pain in R mid back and flank - area of prev pneumonia  Has had CT of area and followed by pulmonary

## 2014-05-04 ENCOUNTER — Encounter: Payer: Self-pay | Admitting: Family Medicine

## 2014-05-04 ENCOUNTER — Encounter: Payer: Self-pay | Admitting: *Deleted

## 2014-05-21 ENCOUNTER — Other Ambulatory Visit: Payer: Self-pay | Admitting: Family Medicine

## 2014-05-23 DIAGNOSIS — H2640 Unspecified secondary cataract: Secondary | ICD-10-CM | POA: Diagnosis not present

## 2014-05-23 DIAGNOSIS — H26492 Other secondary cataract, left eye: Secondary | ICD-10-CM | POA: Diagnosis not present

## 2014-06-01 ENCOUNTER — Ambulatory Visit (INDEPENDENT_AMBULATORY_CARE_PROVIDER_SITE_OTHER): Payer: Medicare Other | Admitting: Family Medicine

## 2014-06-01 ENCOUNTER — Encounter: Payer: Self-pay | Admitting: Family Medicine

## 2014-06-01 VITALS — BP 112/62 | HR 79 | Temp 97.6°F | Ht 62.5 in | Wt 123.5 lb

## 2014-06-01 DIAGNOSIS — E119 Type 2 diabetes mellitus without complications: Secondary | ICD-10-CM

## 2014-06-01 MED ORDER — CITALOPRAM HYDROBROMIDE 10 MG PO TABS: ORAL_TABLET | ORAL | Status: DC

## 2014-06-01 MED ORDER — CLOPIDOGREL BISULFATE 75 MG PO TABS: ORAL_TABLET | ORAL | Status: DC

## 2014-06-01 NOTE — Progress Notes (Signed)
Pre visit review using our clinic review tool, if applicable. No additional management support is needed unless otherwise documented below in the visit note. 

## 2014-06-01 NOTE — Patient Instructions (Addendum)
Try to walk at least 5 days per week if you can  2 carbs per meal maximum Avoid sweets Get protein with every meal   Keep an eye on glucose 2 hours after a meal - and watch for what foods make it go higher   Follow up in 2 months with labs prior   Ask [facility] about their diabetic education program

## 2014-06-01 NOTE — Progress Notes (Signed)
Subjective:    Patient ID: Lauren [name], female    DOB: 17-Oct-1927, 81 y.o.   MRN: [id number]  HPI Here for follow up of DM  Lab Results  Component Value Date   HGBA1C 7.6* 04/24/2014    Am glucose is rarely over 120   Pm - usually 150s- up to 200 at most  She has cut back on sweets  She may not have a good understanding of carbohydrates  Never completed her DM training -- started it in the past    Carbohydrates - eats 1/2 pc bread now at most (whole grain) Some pasta - keeps serving small 2-3 oz  Seldom eats rice  Some fresh fruit - 1/2 of a pc or a whole small orange , or 1/2 banana  Strawberries- less than 1/2 cup  Cereal - bowl with milk (cheerios) - 1/2 cup she suspects  activia   Protein - ? If with every meal   Exercise - she tries to walk - not often enough  She tries to walk 25 minutes when she can   Patient Active Problem List   Diagnosis Date Noted  . Estrogen deficiency 05/01/2014  . Productive cough 05/01/2014  . URI (upper respiratory infection) 04/02/2014  . Left knee pain 09/22/2013  . Chest pain, musculoskeletal 08/05/2013  . Pneumonia involving right lung 07/12/2013  . Streptococcal sore throat 06/07/2013  . Encounter for Medicare annual wellness exam 04/04/2013  . Colon cancer screening 04/04/2013  . Hypoglycemia 10/27/2012  . History of melanoma excision 09/27/2012  . History of breast cancer, left 01/08/2011  . Pelvic fracture 09/17/2010  . DEGENERATIVE JOINT DISEASE 02/13/2010  . MIGRAINE VARIANT 11/06/2009  . Osteopenia 10/18/2008  . NEOPLASM, MALIGNANT, BREAST. left, T1b, N0, lumpectomy 10/25/2007. 09/10/2008  . TIA 09/10/2008  . Diabetes type 2, controlled 10/06/2007  . VENOUS INSUFFICIENCY 10/06/2007  . DIVERTICULOSIS, COLON 10/06/2007  . FIBROCYSTIC BREAST DISEASE 10/06/2007  . HYPERCHOLESTEROLEMIA, PURE 11/29/2006   Past Medical History  Diagnosis Date  . Diabetes mellitus   . Gout     injection in the past  . Fibrocystic  breast   . Venous insufficiency   . Hyperlipidemia   . TIA (transient ischemic attack) 08/2008    neg MRI/MRA and CT and carotid dopplers  . Chronic constipation   . Gastritis   . Chronic headaches   . Pelvic fracture   . Breast cancer     left breast  . Melanoma in situ of left upper extremity    Past Surgical History  Procedure Laterality Date  . Breast cyst aspiration    . Eye surgery      retinal, then cataract  . Breast lumpectomy  09/2007    with sentinel LN biopsy  . Appendectomy    . Breast lumpectomy      left   History  Substance Use Topics  . Smoking status: Never Smoker   . Smokeless tobacco: Never Used  . Alcohol Use: No   Family History  Problem Relation Age of Onset  . Breast cancer Sister   . Stroke Sister   . Cancer Sister     breast  . Breast cancer Sister   . Stroke Sister   . Cancer Sister     breast  . Stroke Mother   . Stroke Father   . Stroke Brother   . Cancer Brother     prostate  . Stroke Brother   . Stroke Sister   . CAD Sister  Allergies  Allergen Reactions  . Sulfonamide Derivatives Itching  . Aspirin Other (See Comments)    Burning and pain in stomach   Current Outpatient Prescriptions on File Prior to Visit  Medication Sig Dispense Refill  . Calcium-Vitamin D 600-200 MG-UNIT per tablet Take 1 tablet by mouth 2 (two) times daily.     . citalopram (CELEXA) 10 MG tablet TAKE ONE (1) TABLET BY MOUTH EVERY DAY 30 tablet 11  . clopidogrel (PLAVIX) 75 MG tablet TAKE ONE TABLET BY MOUTH EVERY MORNING WITH BREAKFAST 30 tablet 5  . fish oil-omega-3 fatty acids 1000 MG capsule Take 1 g by mouth daily.      Marland Kitchen glucose blood test strip Check blood glucose daily and as needed for diabetes type 2 (250.00) 100 each 3  . omeprazole (PRILOSEC) 20 MG capsule Take 20 mg by mouth daily.    . polyethylene glycol powder (GLYCOLAX/MIRALAX) powder DISSOLVE ONE (1) CAPFUL (17GM) INTO 4 TO8 OUNCES OF FLUID AND TAKE BYMOUTH ONCE DAILY AS DIRECTED 527  g 1  . Probiotic Product (PROBIOTIC DAILY PO) Take 1 tablet by mouth daily.    . simvastatin (ZOCOR) 20 MG tablet TAKE ONE TABLET BY MOUTH EVERY NIGHT AT BEDTIME 30 tablet 5   No current facility-administered medications on file prior to visit.     Review of Systems Review of Systems  Constitutional: Negative for fever, appetite change, fatigue and unexpected weight change.  Eyes: Negative for pain and visual disturbance.  Respiratory: Negative for cough and shortness of breath.   Cardiovascular: Negative for cp or palpitations    Gastrointestinal: Negative for nausea, diarrhea and constipation.  Genitourinary: Negative for urgency and frequency.  Skin: Negative for pallor or rash   Neurological: Negative for weakness, light-headedness, numbness and headaches.  Hematological: Negative for adenopathy. Does not bruise/bleed easily.  Psychiatric/Behavioral: Negative for dysphoric mood. The patient is not nervous/anxious.         Objective:   Physical Exam  Constitutional: She appears well-developed and well-nourished. No distress.  HENT:  Head: Normocephalic and atraumatic.  Mouth/Throat: Oropharynx is clear and moist.  Eyes: Conjunctivae and EOM are normal. Pupils are equal, round, and reactive to light. No scleral icterus.  Neck: Normal range of motion. Neck supple. Carotid bruit is not present.  Cardiovascular: Normal rate, regular rhythm, normal heart sounds and intact distal pulses.   Pulmonary/Chest: Effort normal and breath sounds normal. No respiratory distress. She has no wheezes. She has no rales.  Musculoskeletal: She exhibits no edema.  Lymphadenopathy:    She has no cervical adenopathy.  Neurological: She is alert. She has normal reflexes. No cranial nerve deficit. She exhibits normal muscle tone. Coordination normal.  Skin: Skin is warm and dry. No rash noted. No pallor.  Psychiatric: She has a normal mood and affect.          Assessment & Plan:   Problem List  Items Addressed This Visit      Endocrine   Diabetes type 2, controlled - Primary    Lab Results  Component Value Date   HGBA1C 7.6* 04/24/2014   Enc to start checking more post prandial (2 hour) glucose levels Disc DM diet in detail -pt has poor understanding of carb counting and amt of protein needed with meals She never finished DM education - and I recommended checking out the program at [facility] given on DM diet as well   Hesitant to add medication in light of risk for hypoglycemia  Commended walking/exercise   F/u 3 mo

## 2014-06-03 NOTE — Assessment & Plan Note (Signed)
Lab Results  Component Value Date   HGBA1C 7.6* 04/24/2014   Enc to start checking more post prandial (2 hour) glucose levels Disc DM diet in detail -pt has poor understanding of carb counting and amt of protein needed with meals She never finished DM education - and I recommended checking out the program at [facility] given on DM diet as well   Hesitant to add medication in light of risk for hypoglycemia  Commended walking/exercise   F/u 3 mo

## 2014-06-06 ENCOUNTER — Ambulatory Visit
Admission: RE | Admit: 2014-06-06 | Discharge: 2014-06-06 | Disposition: A | Discharge status: Home / Self Care | Payer: Medicare Other | Source: Ambulatory Visit | Attending: Family Medicine | Admitting: Family Medicine

## 2014-06-06 DIAGNOSIS — M85852 Other specified disorders of bone density and structure, left thigh: Secondary | ICD-10-CM | POA: Diagnosis not present

## 2014-06-06 DIAGNOSIS — Z853 Personal history of malignant neoplasm of breast: Secondary | ICD-10-CM | POA: Diagnosis not present

## 2014-06-06 DIAGNOSIS — E2839 Other primary ovarian failure: Secondary | ICD-10-CM

## 2014-06-06 DIAGNOSIS — Z780 Asymptomatic menopausal state: Secondary | ICD-10-CM | POA: Diagnosis not present

## 2014-06-11 ENCOUNTER — Other Ambulatory Visit: Payer: Self-pay | Admitting: Family Medicine

## 2014-06-21 ENCOUNTER — Other Ambulatory Visit: Payer: Self-pay | Admitting: Family Medicine

## 2014-06-28 ENCOUNTER — Telehealth: Payer: Self-pay

## 2014-06-28 NOTE — Telephone Encounter (Signed)
Lauren [name] left note requesting call why citalopram was denied. [name] called back and left v/m to disregard the previous note; [facility] found available refill on hold. Nothing further needed.

## 2014-07-11 ENCOUNTER — Other Ambulatory Visit: Payer: Self-pay | Admitting: Dermatology

## 2014-07-11 DIAGNOSIS — D225 Melanocytic nevi of trunk: Secondary | ICD-10-CM | POA: Diagnosis not present

## 2014-07-11 DIAGNOSIS — Z85828 Personal history of other malignant neoplasm of skin: Secondary | ICD-10-CM | POA: Diagnosis not present

## 2014-07-11 DIAGNOSIS — D1801 Hemangioma of skin and subcutaneous tissue: Secondary | ICD-10-CM | POA: Diagnosis not present

## 2014-07-11 DIAGNOSIS — Z85820 Personal history of malignant melanoma of skin: Secondary | ICD-10-CM | POA: Diagnosis not present

## 2014-07-11 DIAGNOSIS — L814 Other melanin hyperpigmentation: Secondary | ICD-10-CM | POA: Diagnosis not present

## 2014-07-11 DIAGNOSIS — L821 Other seborrheic keratosis: Secondary | ICD-10-CM | POA: Diagnosis not present

## 2014-07-11 DIAGNOSIS — D2271 Melanocytic nevi of right lower limb, including hip: Secondary | ICD-10-CM | POA: Diagnosis not present

## 2014-07-11 DIAGNOSIS — L918 Other hypertrophic disorders of the skin: Secondary | ICD-10-CM | POA: Diagnosis not present

## 2014-07-11 DIAGNOSIS — D485 Neoplasm of uncertain behavior of skin: Secondary | ICD-10-CM | POA: Diagnosis not present

## 2014-07-24 ENCOUNTER — Telehealth: Payer: Self-pay | Admitting: Family Medicine

## 2014-07-24 DIAGNOSIS — E119 Type 2 diabetes mellitus without complications: Secondary | ICD-10-CM

## 2014-07-24 NOTE — Telephone Encounter (Signed)
-----   Message from Ellamae Sia sent at 07/18/2014 10:12 AM EST ----- Regarding: Lab orders for Wednesday,3.2.16 Lab orders for a 2 month f/u

## 2014-07-25 ENCOUNTER — Other Ambulatory Visit (INDEPENDENT_AMBULATORY_CARE_PROVIDER_SITE_OTHER): Payer: Medicare Other

## 2014-07-25 DIAGNOSIS — E119 Type 2 diabetes mellitus without complications: Secondary | ICD-10-CM | POA: Diagnosis not present

## 2014-07-25 LAB — MICROALBUMIN / CREATININE URINE RATIO
Creatinine,U: 119.9 mg/dL
Microalb Creat Ratio: 0.6 mg/g (ref 0.0–30.0)
Microalb, Ur: <0.7 mg/dL (ref 0.0–1.9)

## 2014-07-25 LAB — HEMOGLOBIN A1C: HEMOGLOBIN A1C: 6.9 % — AB (ref 4.6–6.5)

## 2014-08-01 ENCOUNTER — Encounter: Payer: Self-pay | Admitting: Family Medicine

## 2014-08-01 ENCOUNTER — Ambulatory Visit (INDEPENDENT_AMBULATORY_CARE_PROVIDER_SITE_OTHER): Payer: Medicare Other | Admitting: Family Medicine

## 2014-08-01 VITALS — BP 110/60 | HR 78 | Temp 97.6°F | Ht 62.5 in | Wt 122.2 lb

## 2014-08-01 DIAGNOSIS — E119 Type 2 diabetes mellitus without complications: Secondary | ICD-10-CM | POA: Diagnosis not present

## 2014-08-01 NOTE — Patient Instructions (Signed)
I'm glad diabetes looks better  Follow up in 6 months with labs prior  Keep working on healthy diet and exercise

## 2014-08-01 NOTE — Progress Notes (Signed)
Pre visit review using our clinic review tool, if applicable. No additional management support is needed unless otherwise documented below in the visit note. 

## 2014-08-01 NOTE — Progress Notes (Signed)
Subjective:    Patient ID: Lauren [name], female    DOB: [birth year], 81 y.o.   MRN: [id number]  HPI Here for f/u of DM2   Diabetes Home sugar results -overall labile  DM diet - is eating better (cut back on sugar and desserts)-- higher glucose readings are always after eating  Only a few over 200  Am is good  Exercise -walking  Symptoms-none  A1C last  Lab Results  Component Value Date   HGBA1C 6.9* 07/25/2014  down from 7.6  She has been exercising a lot more   No problems with medications  Renal protection-none , her microalb is ok (bp too low)  Last eye exam was in nov   Is going to DM education now -and it is helping     Patient Active Problem List   Diagnosis Date Noted  . Estrogen deficiency 05/01/2014  . Productive cough 05/01/2014  . URI (upper respiratory infection) 04/02/2014  . Left knee pain 09/22/2013  . Chest pain, musculoskeletal 08/05/2013  . Pneumonia involving right lung 07/12/2013  . Encounter for Medicare annual wellness exam 04/04/2013  . Colon cancer screening 04/04/2013  . Hypoglycemia 10/27/2012  . History of melanoma excision 09/27/2012  . History of breast cancer, left 01/08/2011  . Pelvic fracture 09/17/2010  . DEGENERATIVE JOINT DISEASE 02/13/2010  . MIGRAINE VARIANT 11/06/2009  . Osteopenia 10/18/2008  . NEOPLASM, MALIGNANT, BREAST. left, T1b, N0, lumpectomy 10/25/2007. 09/10/2008  . TIA 09/10/2008  . Diabetes type 2, controlled 10/06/2007  . VENOUS INSUFFICIENCY 10/06/2007  . DIVERTICULOSIS, COLON 10/06/2007  . FIBROCYSTIC BREAST DISEASE 10/06/2007  . HYPERCHOLESTEROLEMIA, PURE 11/29/2006   Past Medical History  Diagnosis Date  . Diabetes mellitus   . Gout     injection in the past  . Fibrocystic breast   . Venous insufficiency   . Hyperlipidemia   . TIA (transient ischemic attack) 08/2008    neg MRI/MRA and CT and carotid dopplers  . Chronic constipation   . Gastritis   . Chronic headaches   . Pelvic fracture   .  Breast cancer     left breast  . Melanoma in situ of left upper extremity    Past Surgical History  Procedure Laterality Date  . Breast cyst aspiration    . Eye surgery      retinal, then cataract  . Breast lumpectomy  09/2007    with sentinel LN biopsy  . Appendectomy    . Breast lumpectomy      left   History  Substance Use Topics  . Smoking status: Never Smoker   . Smokeless tobacco: Never Used  . Alcohol Use: No   Family History  Problem Relation Age of Onset  . Breast cancer Sister   . Stroke Sister   . Cancer Sister     breast  . Breast cancer Sister   . Stroke Sister   . Cancer Sister     breast  . Stroke Mother   . Stroke Father   . Stroke Brother   . Cancer Brother     prostate  . Stroke Brother   . Stroke Sister   . CAD Sister    Allergies  Allergen Reactions  . Sulfonamide Derivatives Itching  . Aspirin Other (See Comments)    Burning and pain in stomach   Current Outpatient Prescriptions on File Prior to Visit  Medication Sig Dispense Refill  . Calcium-Vitamin D 600-200 MG-UNIT per tablet Take 1 tablet by mouth 2 (  two) times daily.     . citalopram (CELEXA) 10 MG tablet TAKE ONE (1) TABLET BY MOUTH EVERY DAY 30 tablet 11  . clopidogrel (PLAVIX) 75 MG tablet TAKE ONE TABLET BY MOUTH EVERY MORNING WITH BREAKFAST 30 tablet 11  . fish oil-omega-3 fatty acids 1000 MG capsule Take 1 g by mouth daily.      Marland Kitchen glucose blood test strip Check blood glucose daily and as needed for diabetes type 2 (250.00) 100 each 3  . Multiple Vitamins-Minerals (PRESERVISION AREDS 2 PO) Take 1 tablet by mouth 2 (two) times daily.    Marland Kitchen omeprazole (PRILOSEC) 20 MG capsule Take 20 mg by mouth daily.    . polyethylene glycol powder (GLYCOLAX/MIRALAX) powder DISSOLVE ONE (1) CAPFUL (17GM) INTO 4 TO8 OUNCES OF FLUID AND TAKE BYMOUTH ONCE DAILY AS DIRECTED 527 g 1  . Probiotic Product (PROBIOTIC DAILY PO) Take 1 tablet by mouth daily.    . simvastatin (ZOCOR) 20 MG tablet TAKE ONE  TABLET BY MOUTH EVERY NIGHT AT BEDTIME 30 tablet 5   No current facility-administered medications on file prior to visit.     Review of Systems     Objective:   Physical Exam        Assessment & Plan:

## 2014-08-02 NOTE — Assessment & Plan Note (Signed)
Improved control with more exercise and some diet changes Lab Results  Component Value Date   HGBA1C 6.9* 07/25/2014   Will continue to work on lifestyle  Will update if any hypoglycemia/ rev symptoms

## 2014-08-31 DIAGNOSIS — H6121 Impacted cerumen, right ear: Secondary | ICD-10-CM | POA: Diagnosis not present

## 2014-08-31 DIAGNOSIS — H9191 Unspecified hearing loss, right ear: Secondary | ICD-10-CM | POA: Diagnosis not present

## 2014-08-31 DIAGNOSIS — H903 Sensorineural hearing loss, bilateral: Secondary | ICD-10-CM | POA: Diagnosis not present

## 2014-09-14 DIAGNOSIS — J301 Allergic rhinitis due to pollen: Secondary | ICD-10-CM | POA: Diagnosis not present

## 2014-09-14 DIAGNOSIS — H9191 Unspecified hearing loss, right ear: Secondary | ICD-10-CM | POA: Diagnosis not present

## 2014-09-14 DIAGNOSIS — H9201 Otalgia, right ear: Secondary | ICD-10-CM | POA: Diagnosis not present

## 2014-09-17 ENCOUNTER — Other Ambulatory Visit: Payer: Self-pay | Admitting: Otolaryngology

## 2014-09-17 DIAGNOSIS — IMO0001 Reserved for inherently not codable concepts without codable children: Secondary | ICD-10-CM

## 2014-09-17 DIAGNOSIS — H918X1 Other specified hearing loss, right ear: Secondary | ICD-10-CM

## 2014-09-26 ENCOUNTER — Ambulatory Visit
Admission: RE | Admit: 2014-09-26 | Discharge: 2014-09-26 | Disposition: A | Discharge status: Home / Self Care | Payer: Medicare Other | Source: Ambulatory Visit | Attending: Otolaryngology | Admitting: Otolaryngology

## 2014-09-26 DIAGNOSIS — G319 Degenerative disease of nervous system, unspecified: Secondary | ICD-10-CM | POA: Diagnosis not present

## 2014-09-26 DIAGNOSIS — H918X1 Other specified hearing loss, right ear: Secondary | ICD-10-CM | POA: Diagnosis not present

## 2014-09-26 DIAGNOSIS — IMO0001 Reserved for inherently not codable concepts without codable children: Secondary | ICD-10-CM

## 2014-09-26 DIAGNOSIS — R51 Headache: Secondary | ICD-10-CM | POA: Diagnosis not present

## 2014-09-26 MED ORDER — GADOBENATE DIMEGLUMINE 529 MG/ML IV SOLN
15.0000 mL | Freq: Once | INTRAVENOUS | Status: AC | PRN
  Administered 2014-09-26: 11 mL via INTRAVENOUS

## 2014-10-12 DIAGNOSIS — R42 Dizziness and giddiness: Secondary | ICD-10-CM | POA: Diagnosis not present

## 2014-10-12 DIAGNOSIS — M2660 Temporomandibular joint disorder, unspecified: Secondary | ICD-10-CM | POA: Diagnosis not present

## 2014-10-12 DIAGNOSIS — J301 Allergic rhinitis due to pollen: Secondary | ICD-10-CM | POA: Diagnosis not present

## 2014-10-12 DIAGNOSIS — H6980 Other specified disorders of Eustachian tube, unspecified ear: Secondary | ICD-10-CM | POA: Diagnosis not present

## 2014-10-12 DIAGNOSIS — H9191 Unspecified hearing loss, right ear: Secondary | ICD-10-CM | POA: Diagnosis not present

## 2014-10-15 ENCOUNTER — Other Ambulatory Visit: Payer: Self-pay | Admitting: Family Medicine

## 2014-11-20 ENCOUNTER — Other Ambulatory Visit: Payer: Self-pay | Admitting: Family Medicine

## 2014-11-22 ENCOUNTER — Encounter: Payer: Self-pay | Admitting: Hematology and Oncology

## 2014-11-22 ENCOUNTER — Ambulatory Visit (HOSPITAL_BASED_OUTPATIENT_CLINIC_OR_DEPARTMENT_OTHER): Payer: Medicare Other | Admitting: Hematology and Oncology

## 2014-11-22 VITALS — BP 138/66 | HR 82 | Temp 97.6°F | Resp 18 | Ht 62.5 in | Wt 122.4 lb

## 2014-11-22 DIAGNOSIS — Z853 Personal history of malignant neoplasm of breast: Secondary | ICD-10-CM | POA: Diagnosis not present

## 2014-11-22 DIAGNOSIS — C50912 Malignant neoplasm of unspecified site of left female breast: Secondary | ICD-10-CM

## 2014-11-22 NOTE — Progress Notes (Signed)
Patient Care Team: Abner Greenspan, MD as PCP - General Eston Esters, MD as Consulting Physician (Hematology and Oncology)  DIAGNOSIS: No matching staging information was found for the patient.  SUMMARY OF ONCOLOGIC HISTORY:   Breast cancer, left breast   11/08/2007 Surgery Left breast lumpectomy: 0.8 cm invasive ductal carcinoma grade 1 with intermediate grade DCIS, 0/2 sentinel lymph nodes, LVI present, ER 73% PR 93% Ki-67 10% HER-2/neu negative T1b N0 M0 stage IA   11/22/2007 - 11/21/2012 Anti-estrogen oral therapy Femara 2.5 mg daily    CHIEF COMPLIANT: Follow-up of left breast cancer  INTERVAL HISTORY: Lauren [name] is a 81 year old with above-mentioned history of left breast cancer that was treated with lumpectomy. She did not get radiation because of her age. she went on to get antiestrogen therapy which was completed in 2014. She is here for annual follow-up and reports that she is doing well without any problems or concerns. She denies any lumps or nodules in the breasts.  REVIEW OF SYSTEMS:   Constitutional: Denies fevers, chills or abnormal weight loss Eyes: Denies blurriness of vision Ears, nose, mouth, throat, and face: Denies mucositis or sore throat Respiratory: Denies cough, dyspnea or wheezes Cardiovascular: Denies palpitation, chest discomfort or lower extremity swelling Gastrointestinal:  Denies nausea, heartburn or change in bowel habits Skin: Denies abnormal skin rashes Lymphatics: Denies new lymphadenopathy or easy bruising Neurological:Denies numbness, tingling or new weaknesses Behavioral/Psych: Mood is stable, no new changes  Breast:  denies any pain or lumps or nodules in either breasts All other systems were reviewed with the patient and are negative.  I have reviewed the past medical history, past surgical history, social history and family history with the patient and they are unchanged from previous note.  ALLERGIES:  is allergic to sulfonamide derivatives  and aspirin.  MEDICATIONS:  Current Outpatient Prescriptions  Medication Sig Dispense Refill  . Calcium-Vitamin D 600-200 MG-UNIT per tablet Take 1 tablet by mouth 2 (two) times daily.     . citalopram (CELEXA) 10 MG tablet TAKE ONE (1) TABLET BY MOUTH EVERY DAY 30 tablet 11  . clopidogrel (PLAVIX) 75 MG tablet TAKE ONE TABLET BY MOUTH EVERY MORNING WITH BREAKFAST 30 tablet 11  . fish oil-omega-3 fatty acids 1000 MG capsule Take 1 g by mouth daily.      Marland Kitchen glucose blood test strip Check blood glucose daily and as needed for diabetes type 2 (250.00) 100 each 3  . Multiple Vitamins-Minerals (PRESERVISION AREDS 2 PO) Take 1 tablet by mouth 2 (two) times daily.    Marland Kitchen omeprazole (PRILOSEC) 20 MG capsule Take 20 mg by mouth daily.    . polyethylene glycol powder (GLYCOLAX/MIRALAX) powder DISSOLVE ONE (1) CAPFUL (17GM) INTO 4 TO8 OUNCES OF FLUID AND TAKE BYMOUTH ONCE DAILY AS DIRECTED 527 g 0  . Probiotic Product (PROBIOTIC DAILY PO) Take 1 tablet by mouth daily.    . simvastatin (ZOCOR) 20 MG tablet TAKE ONE (1) TABLET BY MOUTH EVERY NIGHTAT BEDTIME 30 tablet 2   No current facility-administered medications for this visit.    PHYSICAL EXAMINATION: ECOG PERFORMANCE STATUS: 0 - Asymptomatic  Filed Vitals:   11/22/14 1045  BP: 138/66  Pulse: 82  Temp: 97.6 F (36.4 C)  Resp: 18   Filed Weights   11/22/14 1045  Weight: 122 lb 6.4 oz (55.52 kg)    GENERAL:alert, no distress and comfortable SKIN: skin color, texture, turgor are normal, no rashes or significant lesions EYES: normal, Conjunctiva are pink and non-injected,  sclera clear OROPHARYNX:no exudate, no erythema and lips, buccal mucosa, and tongue normal  NECK: supple, thyroid normal size, non-tender, without nodularity LYMPH:  no palpable lymphadenopathy in the cervical, axillary or inguinal LUNGS: clear to auscultation and percussion with normal breathing effort HEART: regular rate & rhythm and no murmurs and no lower extremity  edema ABDOMEN:abdomen soft, non-tender and normal bowel sounds Musculoskeletal:no cyanosis of digits and no clubbing  NEURO: alert & oriented x 3 with fluent speech, no focal motor/sensory deficits BREAST: No palpable masses or nodules in either right or left breasts. No palpable axillary supraclavicular or infraclavicular adenopathy no breast tenderness or nipple discharge. (exam performed in the presence of a chaperone)  LABORATORY DATA:  I have reviewed the data as listed   Chemistry      Component Value Date/Time   NA 141 04/24/2014 0848   NA 141 11/20/2013 0958   K 4.5 04/24/2014 0848   K 4.5 11/20/2013 0958   CL 106 04/24/2014 0848   CO2 26 04/24/2014 0848   CO2 25 11/20/2013 0958   BUN 20 04/24/2014 0848   BUN 14.3 11/20/2013 0958   CREATININE 0.8 04/24/2014 0848   CREATININE 0.9 11/20/2013 0958      Component Value Date/Time   CALCIUM 9.7 04/24/2014 0848   CALCIUM 10.1 11/20/2013 0958   ALKPHOS 72 04/24/2014 0848   ALKPHOS 79 11/20/2013 0958   AST 17 04/24/2014 0848   AST 16 11/20/2013 0958   ALT 13 04/24/2014 0848   ALT 12 11/20/2013 0958   BILITOT 0.8 04/24/2014 0848   BILITOT 0.64 11/20/2013 0958       Lab Results  Component Value Date   WBC 9.6 04/24/2014   HGB 13.7 04/24/2014   HCT 42.4 04/24/2014   MCV 93.8 04/24/2014   PLT 204.0 04/24/2014   NEUTROABS 6.6 04/24/2014   ASSESSMENT & PLAN:  Breast cancer, left breast Left breast lumpectomy June 2009: 0.8 cm invasive ductal carcinoma grade 1 with intermediate grade DCIS, 0/2 sentinel lymph nodes, LVI present, ER 73% PR 93% Ki-67 10% HER-2/neu negative T1b N0 M0 stage IA status post radiation and 5 years of letrozole completed June 2014  Breast Cancer Surveillance: 1. Breast exam 11/22/2014: Normal 2. Mammogram 12/26/2013 benign right breast calcifications and stable post lumpectomy left breast changes. Postsurgical changes. Breast Density Category B. I recommended that she get 3-D mammograms for  surveillance. Discussed the differences between different breast density categories. She will be due for mammograms again in August of this year.  Return to clinic in 1 year to follow with survivorship clinic   No orders of the defined types were placed in this encounter.   The patient has a good understanding of the overall plan. she agrees with it. she will call with any problems that may develop before the next visit here.   Rulon Eisenmenger, MD

## 2014-11-22 NOTE — Assessment & Plan Note (Signed)
Left breast lumpectomy June 2009: 0.8 cm invasive ductal carcinoma grade 1 with intermediate grade DCIS, 0/2 sentinel lymph nodes, LVI present, ER 73% PR 93% Ki-67 10% HER-2/neu negative T1b N0 M0 stage IA status post radiation and 5 years of letrozole completed June 2014  Breast Cancer Surveillance: 1. Breast exam 11/22/2014: Normal 2. Mammogram 12/26/2013 benign right breast calcifications and stable post lumpectomy left breast changes. Postsurgical changes. Breast Density Category B. I recommended that Lauren [name] get 3-D mammograms for surveillance. Discussed the differences between different breast density categories.  Return to clinic in 1 year to follow with survivorship clinic

## 2014-11-27 ENCOUNTER — Other Ambulatory Visit: Payer: Self-pay | Admitting: Hematology and Oncology

## 2014-11-27 ENCOUNTER — Other Ambulatory Visit: Payer: Self-pay | Admitting: Nurse Practitioner

## 2014-11-27 DIAGNOSIS — N6320 Unspecified lump in the left breast, unspecified quadrant: Secondary | ICD-10-CM

## 2014-12-10 ENCOUNTER — Other Ambulatory Visit: Payer: Self-pay | Admitting: Family Medicine

## 2014-12-15 IMAGING — CT CT ANGIO CHEST
2 of 6 series · 19 of 36 positions shown · IV contrast (Omnipaque 300)
Comparison: DG CHEST 2 VIEW dated 07/26/2013; DG CHEST 2 VIEW dated
07/12/2013

CLINICAL DATA: Right lung pneumonia. Right chest pain, shortness of
breath.

EXAM:
CT ANGIOGRAPHY CHEST WITH CONTRAST
TECHNIQUE: Multidetector CT imaging of the chest was performed using the
standard protocol during bolus administration of intravenous
contrast. Multiplanar CT image reconstructions and MIPs were
obtained to evaluate the vascular anatomy.
CONTRAST:  80mL OMNIPAQUE IOHEXOL 350 MG/ML SOLN

[Series 5: thins (id) / (id) · axial · 0.75mm/px · z∈[-224,-2]mm · 18 of 248 slices shown]
[im 13/248  lung]
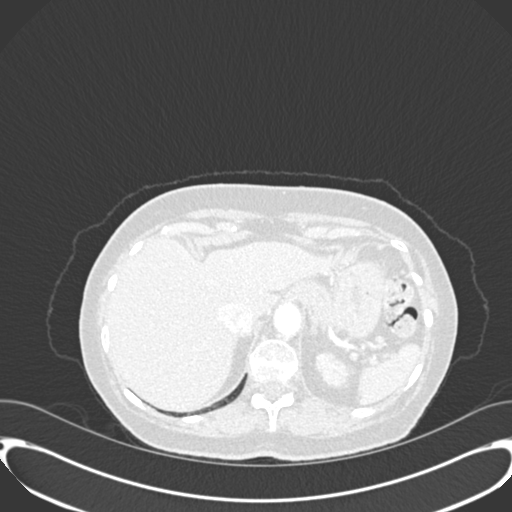
[im 25/248  mediastinal]
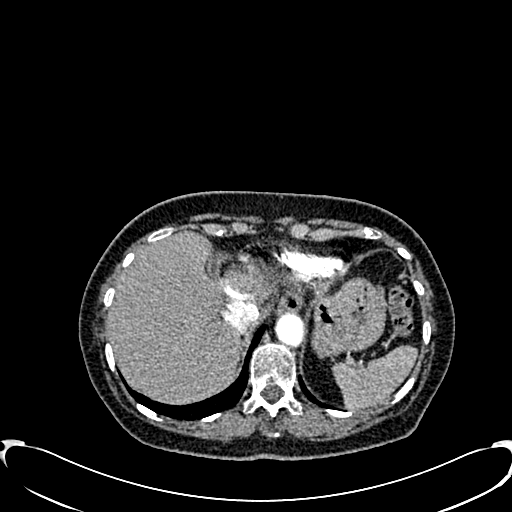
[im 38/248  lung]
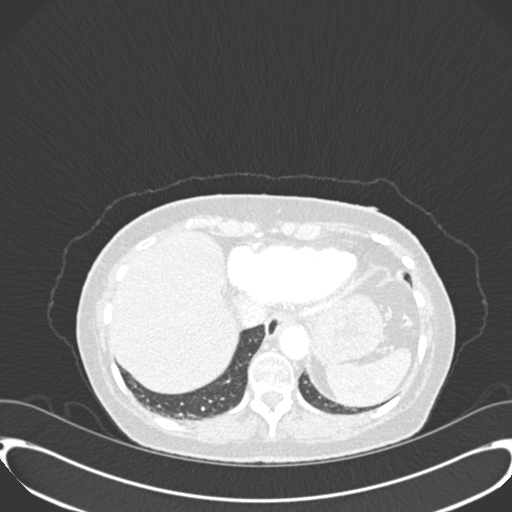
[im 50/248  mediastinal]
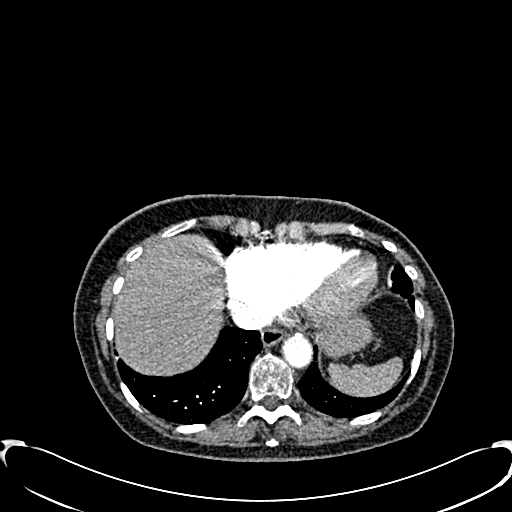
[im 62/248  lung]
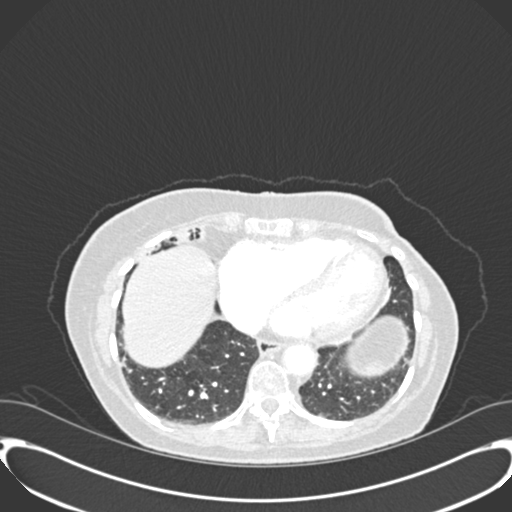
[im 75/248  mediastinal]
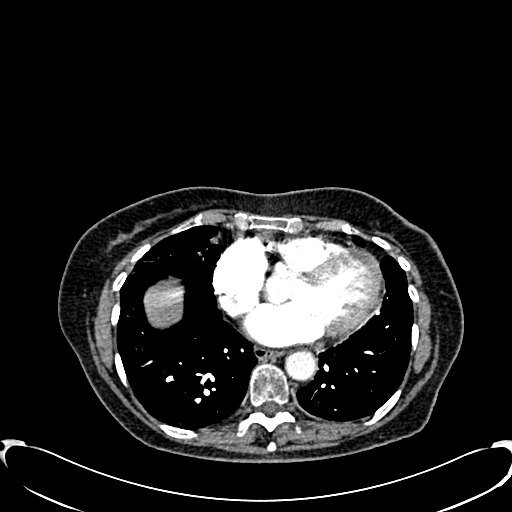
[im 87/248  lung]
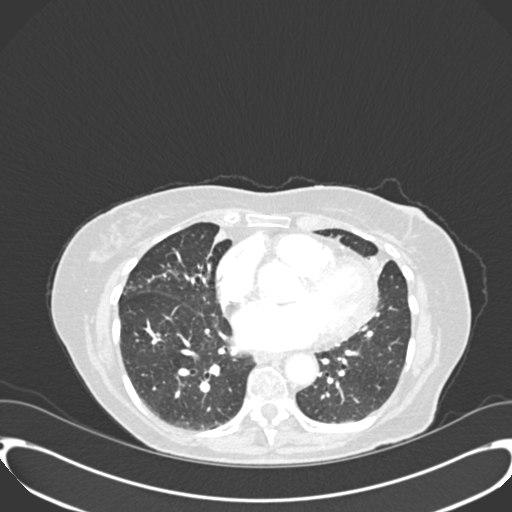
[im 99/248  mediastinal]
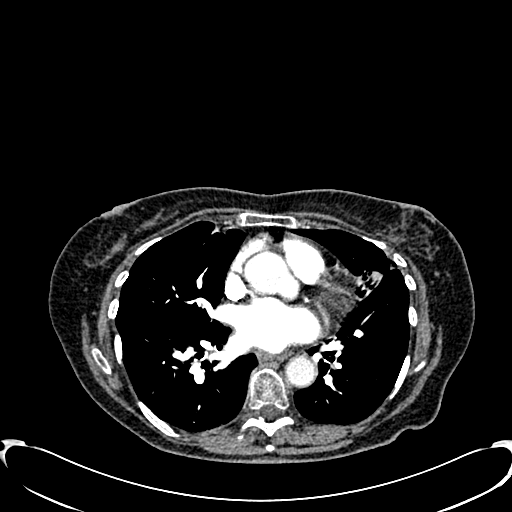
[im 112/248  lung]
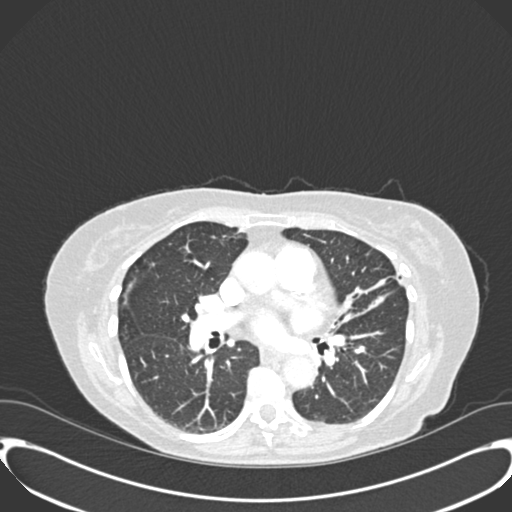
[im 136/248  mediastinal]
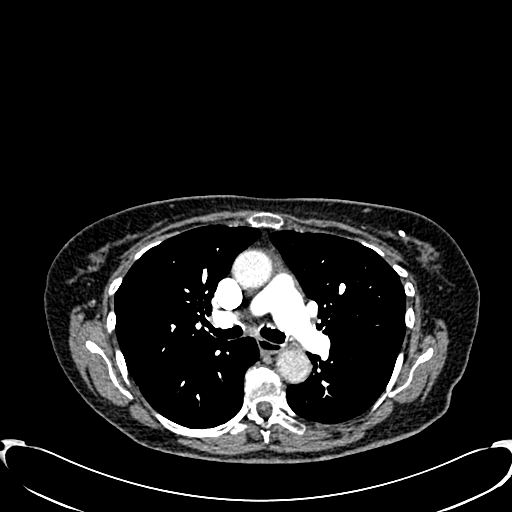
[im 149/248  lung]
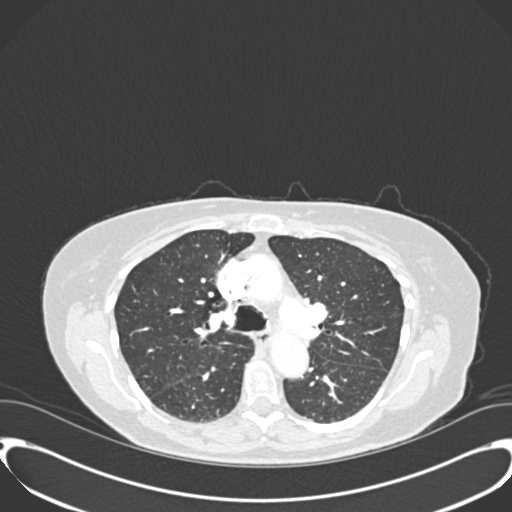
[im 161/248  mediastinal]
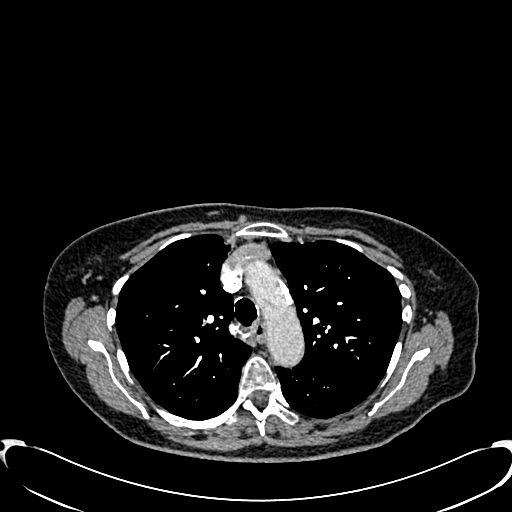
[im 173/248  lung]
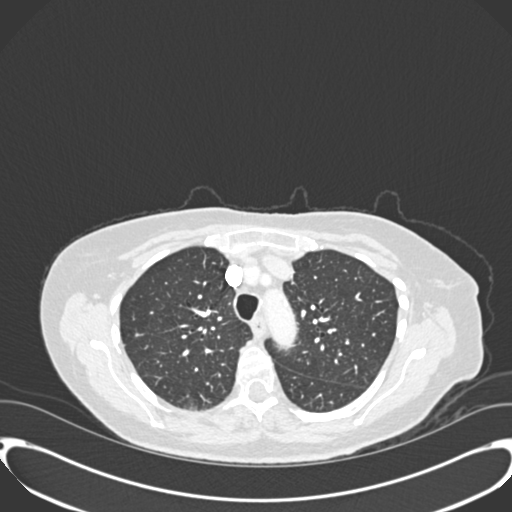
[im 186/248  mediastinal]
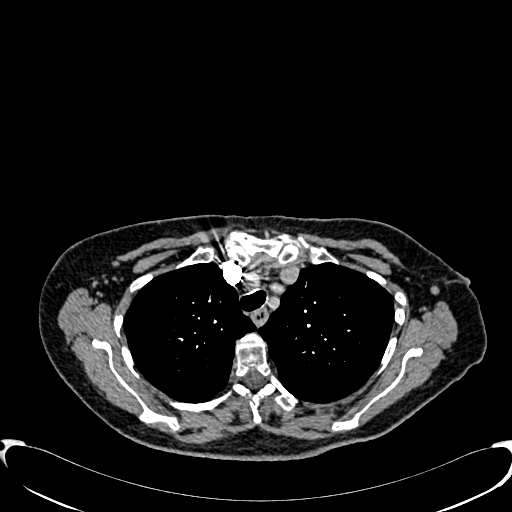
[im 198/248  lung]
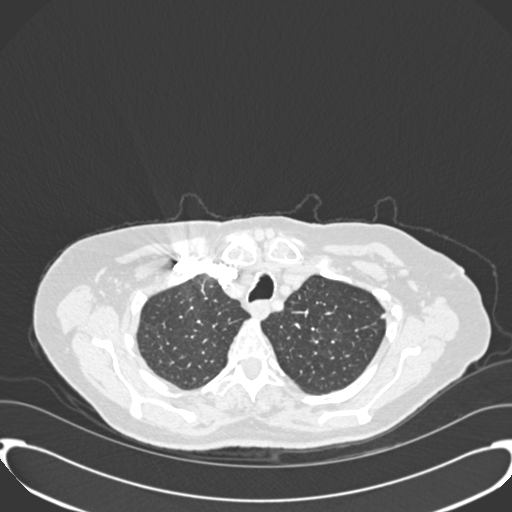
[im 210/248  mediastinal]
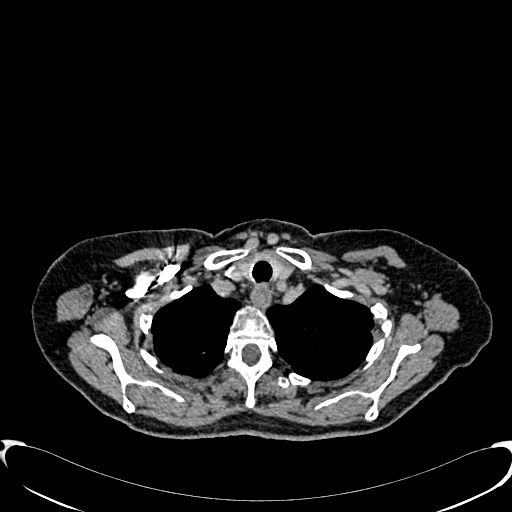
[im 223/248  lung]
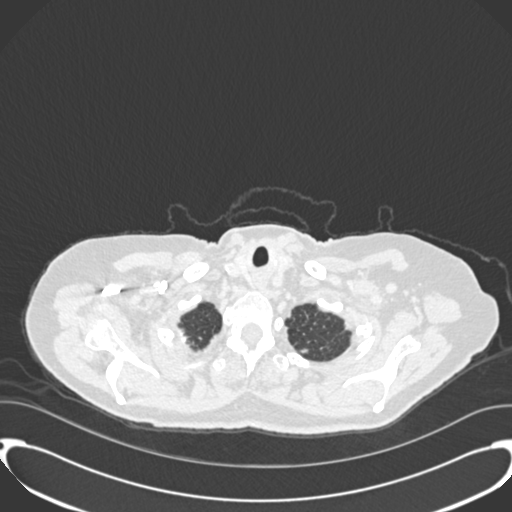
[im 235/248  mediastinal]
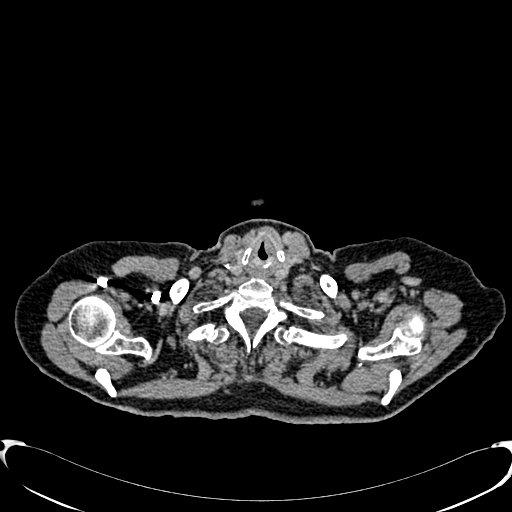

[Series 602: cor mpr · coronal · 0.75mm/px · 1 of 86 slices shown]
[im 43/86  mediastinal]
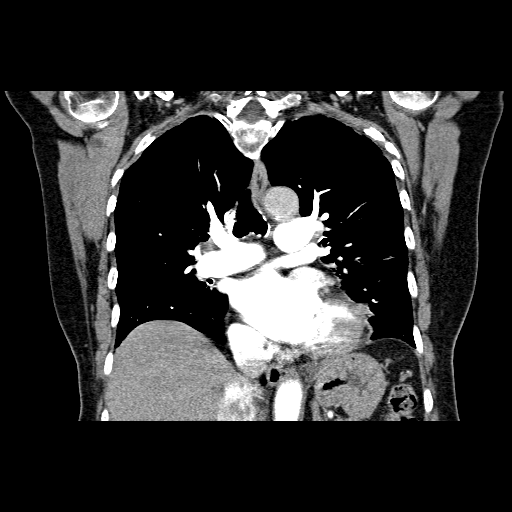

[19 of 36 positions shown; findings below may reference images not displayed]

FINDINGS: No filling defects in the pulmonary arteries to suggest pulmonary
emboli. Heart is mildly enlarged. Aorta is normal caliber. Coronary
artery calcifications in the left anterior descending coronary
artery and right coronary artery.

No mediastinal, hilar, or axillary adenopathy. Chest wall soft
tissues are unremarkable.

Airspace opacities are noted within the right middle lobe and
lingula anteriorly. This is associated with areas of bronchiectasis
and linear density. This clustered nodular densities peripherally in
the right middle lobe. This suggest the sequelae of chronic
infection such as mycobacterium avium intracellulare (BENGUCHE). No other
confluent areas of airspace disease. No pleural effusions.

Imaging into the upper abdomen shows no acute findings.

Review of the MIP images confirms the above findings.
IMPRESSION: No evidence of pulmonary embolus.

Mild cardiomegaly, coronary artery disease.

Changes in the anterior lingula and right middle lobe to suggest
sequela of chronic/ indolent atypical infection such as BENGUCHE.

## 2015-01-01 ENCOUNTER — Ambulatory Visit
Admission: RE | Admit: 2015-01-01 | Discharge: 2015-01-01 | Disposition: A | Discharge status: Home / Self Care | Payer: Medicare Other | Source: Ambulatory Visit | Attending: Hematology and Oncology | Admitting: Hematology and Oncology

## 2015-01-01 DIAGNOSIS — N6320 Unspecified lump in the left breast, unspecified quadrant: Secondary | ICD-10-CM

## 2015-01-01 DIAGNOSIS — Z853 Personal history of malignant neoplasm of breast: Secondary | ICD-10-CM | POA: Diagnosis not present

## 2015-01-01 DIAGNOSIS — R928 Other abnormal and inconclusive findings on diagnostic imaging of breast: Secondary | ICD-10-CM | POA: Diagnosis not present

## 2015-01-17 ENCOUNTER — Ambulatory Visit: Payer: Medicare Other | Admitting: Podiatry

## 2015-01-30 ENCOUNTER — Ambulatory Visit (INDEPENDENT_AMBULATORY_CARE_PROVIDER_SITE_OTHER): Payer: Medicare Other | Admitting: Podiatry

## 2015-01-30 ENCOUNTER — Other Ambulatory Visit (INDEPENDENT_AMBULATORY_CARE_PROVIDER_SITE_OTHER): Payer: Medicare Other

## 2015-01-30 ENCOUNTER — Telehealth (INDEPENDENT_AMBULATORY_CARE_PROVIDER_SITE_OTHER): Payer: Medicare Other | Admitting: Family Medicine

## 2015-01-30 ENCOUNTER — Encounter: Payer: Self-pay | Admitting: Podiatry

## 2015-01-30 VITALS — BP 131/75 | HR 87 | Resp 16

## 2015-01-30 DIAGNOSIS — L600 Ingrowing nail: Secondary | ICD-10-CM | POA: Diagnosis not present

## 2015-01-30 DIAGNOSIS — M2042 Other hammer toe(s) (acquired), left foot: Secondary | ICD-10-CM

## 2015-01-30 DIAGNOSIS — M79675 Pain in left toe(s): Secondary | ICD-10-CM

## 2015-01-30 DIAGNOSIS — E7800 Pure hypercholesterolemia, unspecified: Secondary | ICD-10-CM

## 2015-01-30 DIAGNOSIS — E119 Type 2 diabetes mellitus without complications: Secondary | ICD-10-CM

## 2015-01-30 LAB — COMPREHENSIVE METABOLIC PANEL
ALT: 12 U/L (ref 0–35)
AST: 14 U/L (ref 0–37)
Albumin: 4 g/dL (ref 3.5–5.2)
Alkaline Phosphatase: 68 U/L (ref 39–117)
BUN: 17 mg/dL (ref 6–23)
CHLORIDE: 103 meq/L (ref 96–112)
CO2: 31 meq/L (ref 19–32)
Calcium: 10.2 mg/dL (ref 8.4–10.5)
Creatinine, Ser: 0.79 mg/dL (ref 0.40–1.20)
GFR: 73.5 mL/min (ref >60.00)
Glucose, Bld: 119 mg/dL — ABNORMAL HIGH (ref 70–99)
POTASSIUM: 4.9 meq/L (ref 3.5–5.1)
SODIUM: 140 meq/L (ref 135–145)
TOTAL PROTEIN: 6.9 g/dL (ref 6.0–8.3)
Total Bilirubin: 0.7 mg/dL (ref 0.2–1.2)

## 2015-01-30 LAB — LIPID PANEL
CHOL/HDL RATIO: 5
Cholesterol: 173 mg/dL (ref 0–200)
HDL: 36.3 mg/dL — AB (ref >39.00)
LDL CALC: 99 mg/dL (ref 0–99)
NONHDL: 136.7
Triglycerides: 187 mg/dL — ABNORMAL HIGH (ref 0.0–149.0)
VLDL: 37.4 mg/dL (ref 0.0–40.0)

## 2015-01-30 LAB — HEMOGLOBIN A1C: Hgb A1c MFr Bld: 7 % — ABNORMAL HIGH (ref 4.6–6.5)

## 2015-01-30 NOTE — Progress Notes (Signed)
Subjective:     Patient ID: Lauren [name], female   DOB: 1928/01/24, 81 y.o.   MRN: [id number]  HPI patient presents stating I was concerned about some discoloration of my big toenail and second nail right and also my second toe left over right has a hammertoe which occasionally becomes painful and I wanted to get checked   Review of Systems  All other systems reviewed and are negative.      Objective:   Physical Exam  Constitutional: She is oriented to person, place, and time.  Cardiovascular: Intact distal pulses.   Musculoskeletal: Normal range of motion.  Neurological: She is oriented to person, place, and time.  Skin: Skin is warm and dry.  Nursing note and vitals reviewed.  neurovascular status found to be intact with muscle strength adequate range of motion within normal limits. Patient's found have good digital perfusion is well oriented 3 and is noted to have elevation of the second toe left of a moderate nature with redness on top of the toe and is noted also to have some yellow discoloration of the hallux and second nail on the right foot patient has mild equinus condition     Assessment:     Hammertoe deformity second digit left over right along with nail disease hallux and second nail right foot with thickness and yellow type debris    Plan:     H&P and education rendered on all conditions. Do not recommend treatment and less symptoms should worsen

## 2015-01-30 NOTE — Progress Notes (Signed)
   Subjective:    Patient ID: Lauren [name], female    DOB: January 11, 1928, 81 y.o.   MRN: [id number]  HPI Pt presents with right 2nd nail that is coming off, she also has a dark spot on the plantar aspect of her right foot. She also c/o discomfort in 2nd left toe with hammertoe deformity   Review of Systems  Neurological: Positive for headaches.  All other systems reviewed and are negative.      Objective:   Physical Exam        Assessment & Plan:

## 2015-01-30 NOTE — Telephone Encounter (Signed)
-----   Message from Ellamae Sia sent at 01/24/2015 10:09 AM EDT ----- Regarding: Lab orders for Wednesday, 9.7.16 Lab orders for a 6 month follow up appt

## 2015-02-01 ENCOUNTER — Ambulatory Visit: Payer: Medicare Other | Admitting: Family Medicine

## 2015-02-04 ENCOUNTER — Ambulatory Visit (INDEPENDENT_AMBULATORY_CARE_PROVIDER_SITE_OTHER): Payer: Medicare Other | Admitting: Family Medicine

## 2015-02-04 ENCOUNTER — Encounter: Payer: Self-pay | Admitting: Family Medicine

## 2015-02-04 VITALS — BP 122/68 | HR 84 | Temp 98.2°F | Ht 62.5 in | Wt 121.0 lb

## 2015-02-04 DIAGNOSIS — Z853 Personal history of malignant neoplasm of breast: Secondary | ICD-10-CM

## 2015-02-04 DIAGNOSIS — E7800 Pure hypercholesterolemia, unspecified: Secondary | ICD-10-CM

## 2015-02-04 DIAGNOSIS — E119 Type 2 diabetes mellitus without complications: Secondary | ICD-10-CM | POA: Diagnosis not present

## 2015-02-04 DIAGNOSIS — Z23 Encounter for immunization: Secondary | ICD-10-CM

## 2015-02-04 NOTE — Patient Instructions (Signed)
Take care of yourself  Keep getting protein with every meal and watch the carbohydrates Stay as active as you can  You may want to keep a food diary to see what makes you more gassy  Continue 1/2 dose of celexa if it works better for you  Cholesterol looks better  Diabetes is about the same   Follow up in 6 months for annual exam with labs prior

## 2015-02-04 NOTE — Assessment & Plan Note (Signed)
Lab Results  Component Value Date   HGBA1C 7.0* 01/30/2015   Fairly stable  Not on medication in light of prev hypoglycemia  Rev diet  Reminded about portion sizes and need for protein with every meal  Due for eye exam in Nov  F/u planned

## 2015-02-04 NOTE — Assessment & Plan Note (Signed)
Oncology signed off on her case  Will likely come here for annual exams

## 2015-02-04 NOTE — Assessment & Plan Note (Signed)
Disc goals for lipids and reasons to control them Rev labs with pt Rev low sat fat diet in detail Overall stable on zocor and diet  Enc her to be as active as you can

## 2015-02-04 NOTE — Progress Notes (Signed)
Pre visit review using our clinic review tool, if applicable. No additional management support is needed unless otherwise documented below in the visit note. 

## 2015-02-04 NOTE — Progress Notes (Signed)
Subjective:    Patient ID: Lauren [name], female    DOB: 05-13-1928, 81 y.o.   MRN: [id number]  HPI Here for f/u of chronic medical problems  Has been feeling pretty good overall   Still has a lot of gas -ongoing , long time  mylanta or gas ex help  Comes and goes Does not want to go back to GI however    BP Readings from Last 3 Encounters:  02/04/15 122/68  01/30/15 131/75  11/22/14 138/66      Wt is down 1 lb with bmi of 21  Diabetes Home sugar results  DM diet - very good  Exercise - tries to walk (3-4 times per week) Symptoms none  A1C last  Lab Results  Component Value Date   HGBA1C 7.0* 01/30/2015   This is up from 6.9 Not on any blood sugar medicines now at all   (has hx of hypoglycemia) - was told to stop taking metformin at a visit to the hospital  Renal protection ace  Last eye exam up to date - due in nov    Hyperlipidemia Lab Results  Component Value Date   CHOL 173 01/30/2015   CHOL 191 04/24/2014   CHOL 188 03/28/2013   Lab Results  Component Value Date   HDL 36.30* 01/30/2015   HDL 29.30* 04/24/2014   HDL 45.90 03/28/2013   Lab Results  Component Value Date   LDLCALC 99 01/30/2015   LDLCALC 113* 03/28/2013   LDLCALC 59 10/28/2012   Lab Results  Component Value Date   TRIG 187.0* 01/30/2015   TRIG 233.0* 04/24/2014   TRIG 145.0 03/28/2013   Lab Results  Component Value Date   CHOLHDL 5 01/30/2015   CHOLHDL 7 04/24/2014   CHOLHDL 4 03/28/2013   Lab Results  Component Value Date   LDLDIRECT 76.2 04/24/2014   LDLDIRECT 46.3 10/01/2009   On zocor and diet  Improved in trig , LDL and HDL   Mood - is on celexa 10 mg  She missed one pill one day and she felt better the next day (not as groggy)  So she cut to 1/2 pill daily and feels better - stable mood and sleeping well but not as sleepy    Patient Active Problem List   Diagnosis Date Noted  . Estrogen deficiency 05/01/2014  . Productive cough 05/01/2014  . URI (upper  respiratory infection) 04/02/2014  . Left knee pain 09/22/2013  . Chest pain, musculoskeletal 08/05/2013  . Pneumonia involving right lung 07/12/2013  . Encounter for Medicare annual wellness exam 04/04/2013  . Colon cancer screening 04/04/2013  . Hypoglycemia 10/27/2012  . History of melanoma excision 09/27/2012  . History of breast cancer, left 01/08/2011  . Pelvic fracture 09/17/2010  . DEGENERATIVE JOINT DISEASE 02/13/2010  . MIGRAINE VARIANT 11/06/2009  . Osteopenia 10/18/2008  . Breast cancer, left breast 09/10/2008  . TIA 09/10/2008  . Diabetes type 2, controlled 10/06/2007  . VENOUS INSUFFICIENCY 10/06/2007  . DIVERTICULOSIS, COLON 10/06/2007  . FIBROCYSTIC BREAST DISEASE 10/06/2007  . HYPERCHOLESTEROLEMIA, PURE 11/29/2006   Past Medical History  Diagnosis Date  . Diabetes mellitus   . Gout     injection in the past  . Fibrocystic breast   . Venous insufficiency   . Hyperlipidemia   . TIA (transient ischemic attack) 08/2008    neg MRI/MRA and CT and carotid dopplers  . Chronic constipation   . Gastritis   . Chronic headaches   . Pelvic fracture   .  Breast cancer     left breast  . Melanoma in situ of left upper extremity    Past Surgical History  Procedure Laterality Date  . Breast cyst aspiration    . Eye surgery      retinal, then cataract  . Breast lumpectomy  09/2007    with sentinel LN biopsy  . Appendectomy    . Breast lumpectomy      left   Social History  Substance Use Topics  . Smoking status: Never Smoker   . Smokeless tobacco: Never Used  . Alcohol Use: No   Family History  Problem Relation Age of Onset  . Breast cancer Sister   . Stroke Sister   . Cancer Sister     breast  . Breast cancer Sister   . Stroke Sister   . Cancer Sister     breast  . Stroke Mother   . Stroke Father   . Stroke Brother   . Cancer Brother     prostate  . Stroke Brother   . Stroke Sister   . CAD Sister    Allergies  Allergen Reactions  .  Sulfonamide Derivatives Itching  . Aspirin Other (See Comments)    Burning and pain in stomach   Current Outpatient Prescriptions on File Prior to Visit  Medication Sig Dispense Refill  . Calcium-Vitamin D 600-200 MG-UNIT per tablet Take 1 tablet by mouth 2 (two) times daily.     . citalopram (CELEXA) 10 MG tablet TAKE ONE (1) TABLET BY MOUTH EVERY DAY (Patient taking differently: 0.5 mg. TAKE ONE (1) TABLET BY MOUTH EVERY DAY) 30 tablet 11  . clopidogrel (PLAVIX) 75 MG tablet TAKE ONE TABLET BY MOUTH EVERY MORNING WITH BREAKFAST 30 tablet 11  . fish oil-omega-3 fatty acids 1000 MG capsule Take 1 g by mouth daily.      Marland Kitchen glucose blood test strip Check blood glucose daily and as needed for diabetes type 2 (250.00) 100 each 3  . Multiple Vitamins-Minerals (PRESERVISION AREDS 2 PO) Take 1 tablet by mouth 2 (two) times daily.    Marland Kitchen omeprazole (PRILOSEC) 20 MG capsule Take 20 mg by mouth daily.    . polyethylene glycol powder (GLYCOLAX/MIRALAX) powder DISSOLVE ONE (1) CAPFUL (17GM) INTO 4 TO8 OUNCES OF FLUID AND TAKE BYMOUTH ONCE DAILY AS DIRECTED 527 g 1  . Probiotic Product (PROBIOTIC DAILY PO) Take 1 tablet by mouth daily.    . simvastatin (ZOCOR) 20 MG tablet TAKE ONE (1) TABLET BY MOUTH EVERY NIGHTAT BEDTIME 30 tablet 2   No current facility-administered medications on file prior to visit.    Review of Systems Review of Systems  Constitutional: Negative for fever, appetite change, fatigue and unexpected weight change.  Eyes: Negative for pain and visual disturbance.  Respiratory: Negative for cough and shortness of breath.   Cardiovascular: Negative for cp or palpitations    Gastrointestinal: Negative for nausea, diarrhea and constipation. pos for chronic bloating and gas that is bothersome  Genitourinary: Negative for urgency and frequency.  Skin: Negative for pallor or rash   Neurological: Negative for weakness, light-headedness, numbness and headaches.  Hematological: Negative for  adenopathy. Does not bruise/bleed easily.  Psychiatric/Behavioral: Negative for dysphoric mood. The patient is sometimes  nervous/anxious.         Objective:   Physical Exam  Constitutional: She appears well-developed and well-nourished. No distress.  Frail appearing elderly female Mildly anxious   HENT:  Head: Normocephalic and atraumatic.  Mouth/Throat: Oropharynx is clear  and moist.  Eyes: Conjunctivae and EOM are normal. Pupils are equal, round, and reactive to light.  Neck: Normal range of motion. Neck supple. No JVD present. Carotid bruit is not present. No thyromegaly present.  Cardiovascular: Normal rate, regular rhythm, normal heart sounds and intact distal pulses.  Exam reveals no gallop.   Varicosities in LEs  Pulmonary/Chest: Effort normal and breath sounds normal. No respiratory distress. She has no wheezes. She has no rales.  No crackles  Abdominal: Soft. Bowel sounds are normal. She exhibits no distension, no abdominal bruit and no mass. There is no tenderness.  Musculoskeletal: She exhibits no edema.  Lymphadenopathy:    She has no cervical adenopathy.  Neurological: She is alert. She has normal reflexes. No cranial nerve deficit. She exhibits normal muscle tone. Coordination normal.  Skin: Skin is warm and dry. No rash noted.  Psychiatric: She has a normal mood and affect.  Pleasant and talkative  Mildly anxious - baseline           Assessment & Plan:   Problem List Items Addressed This Visit      Endocrine   Diabetes type 2, controlled    Lab Results  Component Value Date   HGBA1C 7.0* 01/30/2015   Fairly stable  Not on medication in light of prev hypoglycemia  Rev diet  Reminded about portion sizes and need for protein with every meal  Due for eye exam in Nov  F/u planned         Other   History of breast cancer, left    Oncology signed off on her case  Will likely come here for annual exams       HYPERCHOLESTEROLEMIA, PURE    Disc goals  for lipids and reasons to control them Rev labs with pt Rev low sat fat diet in detail Overall stable on zocor and diet  Enc her to be as active as you can        Other Visit Diagnoses    Need for influenza vaccination    -  Primary    Relevant Orders    Flu Vaccine QUAD 36+ mos PF IM (Fluarix & Fluzone Quad PF) (Completed)

## 2015-02-18 ENCOUNTER — Other Ambulatory Visit: Payer: Self-pay | Admitting: Family Medicine

## 2015-02-28 DIAGNOSIS — Z85828 Personal history of other malignant neoplasm of skin: Secondary | ICD-10-CM | POA: Diagnosis not present

## 2015-02-28 DIAGNOSIS — L738 Other specified follicular disorders: Secondary | ICD-10-CM | POA: Diagnosis not present

## 2015-02-28 DIAGNOSIS — L814 Other melanin hyperpigmentation: Secondary | ICD-10-CM | POA: Diagnosis not present

## 2015-02-28 DIAGNOSIS — L821 Other seborrheic keratosis: Secondary | ICD-10-CM | POA: Diagnosis not present

## 2015-02-28 DIAGNOSIS — L820 Inflamed seborrheic keratosis: Secondary | ICD-10-CM | POA: Diagnosis not present

## 2015-02-28 DIAGNOSIS — D225 Melanocytic nevi of trunk: Secondary | ICD-10-CM | POA: Diagnosis not present

## 2015-02-28 DIAGNOSIS — Z85820 Personal history of malignant melanoma of skin: Secondary | ICD-10-CM | POA: Diagnosis not present

## 2015-02-28 DIAGNOSIS — D2272 Melanocytic nevi of left lower limb, including hip: Secondary | ICD-10-CM | POA: Diagnosis not present

## 2015-03-06 ENCOUNTER — Ambulatory Visit (INDEPENDENT_AMBULATORY_CARE_PROVIDER_SITE_OTHER): Payer: Medicare Other | Admitting: Ophthalmology

## 2015-03-06 DIAGNOSIS — H353122 Nonexudative age-related macular degeneration, left eye, intermediate dry stage: Secondary | ICD-10-CM

## 2015-03-06 DIAGNOSIS — E113293 Type 2 diabetes mellitus with mild nonproliferative diabetic retinopathy without macular edema, bilateral: Secondary | ICD-10-CM | POA: Diagnosis not present

## 2015-03-06 DIAGNOSIS — H353111 Nonexudative age-related macular degeneration, right eye, early dry stage: Secondary | ICD-10-CM | POA: Diagnosis not present

## 2015-03-06 DIAGNOSIS — H43813 Vitreous degeneration, bilateral: Secondary | ICD-10-CM

## 2015-03-06 DIAGNOSIS — E11319 Type 2 diabetes mellitus with unspecified diabetic retinopathy without macular edema: Secondary | ICD-10-CM | POA: Diagnosis not present

## 2015-03-14 ENCOUNTER — Other Ambulatory Visit: Payer: Self-pay | Admitting: Family Medicine

## 2015-04-11 ENCOUNTER — Other Ambulatory Visit: Payer: Self-pay | Admitting: Family Medicine

## 2015-04-29 ENCOUNTER — Encounter: Payer: Self-pay | Admitting: Family Medicine

## 2015-04-29 ENCOUNTER — Ambulatory Visit (INDEPENDENT_AMBULATORY_CARE_PROVIDER_SITE_OTHER): Payer: Medicare Other | Admitting: Family Medicine

## 2015-04-29 VITALS — BP 114/60 | HR 99 | Temp 98.3°F | Wt 122.2 lb

## 2015-04-29 DIAGNOSIS — R509 Fever, unspecified: Secondary | ICD-10-CM | POA: Diagnosis not present

## 2015-04-29 DIAGNOSIS — B349 Viral infection, unspecified: Secondary | ICD-10-CM | POA: Insufficient documentation

## 2015-04-29 LAB — POC INFLUENZA A&B (BINAX/QUICKVUE)
INFLUENZA B, POC: NEGATIVE
Influenza A, POC: NEGATIVE

## 2015-04-29 MED ORDER — CITALOPRAM HYDROBROMIDE 10 MG PO TABS: 5.0000 mg | ORAL_TABLET | Freq: Every day | ORAL | Status: DC

## 2015-04-29 MED ORDER — FLUTICASONE PROPIONATE 50 MCG/ACT NA SUSP: 2.0000 | Freq: Every day | NASAL | Status: DC

## 2015-04-29 NOTE — Assessment & Plan Note (Signed)
Likely viral, but not flu.  D/w pt.  Supportive tx, f/u prn.  See AVS.  No sign of PNA.  D/w pt.  Okay for outpatient f/u.  Call back as needed.

## 2015-04-29 NOTE — Progress Notes (Signed)
Pre visit review using our clinic review tool, if applicable. No additional management support is needed unless otherwise documented below in the visit note.  Sx started about 3 days ago.  Worse in the meantime.  Rhinorrhea, clear. Temp up to 101 recently.  Not SOB but stuffy.  No aches.  Not much cough.  No ear pain.  No ST.  Sick contacts at home, sister had a cough but w/o a fever.   She had a flu shot this fall.    Her other sister recently died.  I offered condolences.    Meds, vitals, and allergies reviewed.   ROS: See HPI.  Otherwise, noncontributory.  GEN: nad, alert and oriented HEENT: mucous membranes moist, tm w/o erythema, nasal exam w/o erythema, clear discharge noted,  OP with cobblestoning NECK: supple w/o LA CV: rrr.   PULM: ctab, no inc wob EXT: no edema SKIN: no acute rash

## 2015-04-29 NOTE — Patient Instructions (Signed)
Your flu test was negative.  I think this is a cold, not pneumonia.   I would use tylenol and flonase for now.   Drink plenty of fluids and update me in a few days if needed.  Take care. Glad to see you.

## 2015-06-12 DIAGNOSIS — H353221 Exudative age-related macular degeneration, left eye, with active choroidal neovascularization: Secondary | ICD-10-CM | POA: Diagnosis not present

## 2015-06-12 DIAGNOSIS — H43813 Vitreous degeneration, bilateral: Secondary | ICD-10-CM | POA: Diagnosis not present

## 2015-06-12 DIAGNOSIS — E113291 Type 2 diabetes mellitus with mild nonproliferative diabetic retinopathy without macular edema, right eye: Secondary | ICD-10-CM | POA: Diagnosis not present

## 2015-06-12 DIAGNOSIS — H5213 Myopia, bilateral: Secondary | ICD-10-CM | POA: Diagnosis not present

## 2015-06-12 LAB — HM DIABETES EYE EXAM

## 2015-06-13 ENCOUNTER — Other Ambulatory Visit: Payer: Self-pay | Admitting: Family Medicine

## 2015-06-18 ENCOUNTER — Encounter: Payer: Self-pay | Admitting: Family Medicine

## 2015-07-28 ENCOUNTER — Telehealth: Payer: Self-pay | Admitting: Family Medicine

## 2015-07-28 DIAGNOSIS — E119 Type 2 diabetes mellitus without complications: Secondary | ICD-10-CM

## 2015-07-28 DIAGNOSIS — E7800 Pure hypercholesterolemia, unspecified: Secondary | ICD-10-CM

## 2015-07-28 NOTE — Telephone Encounter (Signed)
-----   Message from Ellamae Sia sent at 07/23/2015  4:03 PM EST ----- Regarding: Lab orders for Friday, 3.10.17  AWV lab orders, please.

## 2015-08-02 ENCOUNTER — Ambulatory Visit (INDEPENDENT_AMBULATORY_CARE_PROVIDER_SITE_OTHER): Payer: Medicare Other

## 2015-08-02 ENCOUNTER — Other Ambulatory Visit (INDEPENDENT_AMBULATORY_CARE_PROVIDER_SITE_OTHER): Payer: Medicare Other

## 2015-08-02 ENCOUNTER — Telehealth: Payer: Self-pay

## 2015-08-02 VITALS — BP 112/68 | HR 78 | Temp 97.9°F | Ht 62.5 in | Wt 120.5 lb

## 2015-08-02 DIAGNOSIS — E7800 Pure hypercholesterolemia, unspecified: Secondary | ICD-10-CM | POA: Diagnosis not present

## 2015-08-02 DIAGNOSIS — E119 Type 2 diabetes mellitus without complications: Secondary | ICD-10-CM

## 2015-08-02 DIAGNOSIS — Z0000 Encounter for general adult medical examination without abnormal findings: Secondary | ICD-10-CM | POA: Diagnosis not present

## 2015-08-02 LAB — CBC WITH DIFFERENTIAL/PLATELET
Basophils Absolute: 0.1 10*3/uL (ref 0.0–0.1)
Basophils Relative: 0.9 % (ref 0.0–3.0)
Eosinophils Absolute: 0.3 10*3/uL (ref 0.0–0.7)
Eosinophils Relative: 3.2 % (ref 0.0–5.0)
HCT: 42.4 % (ref 36.0–46.0)
Hemoglobin: 14.1 g/dL (ref 12.0–15.0)
LYMPHS ABS: 1.9 10*3/uL (ref 0.7–4.0)
Lymphocytes Relative: 18.7 % (ref 12.0–46.0)
MCHC: 33.3 g/dL (ref 30.0–36.0)
MCV: 92.4 fl (ref 78.0–100.0)
MONO ABS: 0.6 10*3/uL (ref 0.1–1.0)
Monocytes Relative: 5.9 % (ref 3.0–12.0)
Neutro Abs: 7.4 10*3/uL (ref 1.4–7.7)
Neutrophils Relative %: 71.3 % (ref 43.0–77.0)
PLATELETS: 194 10*3/uL (ref 150.0–400.0)
RBC: 4.59 Mil/uL (ref 3.87–5.11)
RDW: 14.4 % (ref 11.5–15.5)
WBC: 10.4 10*3/uL (ref 4.0–10.5)

## 2015-08-02 LAB — LIPID PANEL
CHOL/HDL RATIO: 4
Cholesterol: 161 mg/dL (ref 0–200)
HDL: 38.3 mg/dL — AB (ref >39.00)
LDL Cholesterol: 85 mg/dL (ref 0–99)
NonHDL: 122.51
Triglycerides: 190 mg/dL — ABNORMAL HIGH (ref 0.0–149.0)
VLDL: 38 mg/dL (ref 0.0–40.0)

## 2015-08-02 LAB — COMPREHENSIVE METABOLIC PANEL
ALT: 17 U/L (ref 0–35)
AST: 17 U/L (ref 0–37)
Albumin: 4.2 g/dL (ref 3.5–5.2)
Alkaline Phosphatase: 61 U/L (ref 39–117)
BUN: 18 mg/dL (ref 6–23)
CHLORIDE: 104 meq/L (ref 96–112)
CO2: 31 mEq/L (ref 19–32)
Calcium: 10.1 mg/dL (ref 8.4–10.5)
Creatinine, Ser: 0.8 mg/dL (ref 0.40–1.20)
GFR: 72.36 mL/min (ref >60.00)
GLUCOSE: 135 mg/dL — AB (ref 70–99)
POTASSIUM: 4.5 meq/L (ref 3.5–5.1)
SODIUM: 141 meq/L (ref 135–145)
Total Bilirubin: 0.7 mg/dL (ref 0.2–1.2)
Total Protein: 7.4 g/dL (ref 6.0–8.3)

## 2015-08-02 LAB — HEMOGLOBIN A1C: HEMOGLOBIN A1C: 7 % — AB (ref 4.6–6.5)

## 2015-08-02 LAB — MICROALBUMIN / CREATININE URINE RATIO
Creatinine,U: 63.6 mg/dL
MICROALB/CREAT RATIO: 1.1 mg/g (ref 0.0–30.0)
Microalb, Ur: <0.7 mg/dL (ref 0.0–1.9)

## 2015-08-02 LAB — TSH: TSH: 5.87 u[IU]/mL — ABNORMAL HIGH (ref 0.35–4.50)

## 2015-08-02 MED ORDER — CITALOPRAM HYDROBROMIDE 10 MG PO TABS: 5.0000 mg | ORAL_TABLET | Freq: Every day | ORAL | Status: DC

## 2015-08-02 MED ORDER — CLOPIDOGREL BISULFATE 75 MG PO TABS: ORAL_TABLET | ORAL | Status: DC

## 2015-08-02 MED ORDER — POLYETHYLENE GLYCOL 3350 17 GM/SCOOP PO POWD: ORAL | Status: DC

## 2015-08-02 NOTE — Progress Notes (Signed)
Pre visit review using our clinic review tool, if applicable. No additional management support is needed unless otherwise documented below in the visit note. 

## 2015-08-02 NOTE — Telephone Encounter (Signed)
Patient states she will be out of Celexa on Sunday. Patient states she is currently out of Miralax. Patient states she has no refills remaining for Plavix but is not currently out of medication.

## 2015-08-02 NOTE — Patient Instructions (Addendum)
Lauren [name] , Thank you for taking time to come for your Medicare Wellness Visit. I appreciate your ongoing commitment to your health goals. Please review the following plan we discussed and let me know if I can assist you in the future.   These are the goals we discussed: Goals    . Increase physical activity     When weather permits, I will walk an extra 1-2 days per week for 30 min daily.       This is a list of the screening recommended for you and due dates:  Health Maintenance  Topic Date Due  . Complete foot exam   08/06/2015*  . Tetanus Vaccine  10/24/2015*  . Flu Shot  12/24/2015  . Mammogram  01/01/2016  . Hemoglobin A1C  02/02/2016  . Eye exam for diabetics  06/11/2016  . Urine Protein Check  08/01/2016  . DEXA scan (bone density measurement)  Completed  . Shingles Vaccine  Completed  . Pneumonia vaccines  Completed  *Topic was postponed. The date shown is not the original due date.   Preventive Care for Women  A healthy lifestyle and preventive care can promote health and wellness. Preventive health guidelines for women include the following key practices.  . A routine yearly physical is a good way to check with your health care provider about your health and preventive screening. It is a chance to share any concerns and updates on your health and to receive a thorough exam.  . Visit your dentist for a routine exam and preventive care every 6 months. Brush your teeth twice a day and floss once a day. Good oral hygiene prevents tooth decay and gum disease.  . The frequency of eye exams is based on your age, health, family medical history, use  of contact lenses, and other factors. Follow your health care provider's ecommendations for frequency of eye exams.  . Eat a healthy diet. Foods like vegetables, fruits, whole grains, low-fat dairy products, and lean protein foods contain the nutrients you need without too many calories. Decrease your intake of foods high in  solid fats, added sugars, and salt. Eat the right amount of calories for you. Get information about a proper diet from your health care provider, if necessary.  . Regular physical exercise is one of the most important things you can do for your health. Most adults should get at least 150 minutes of moderate-intensity exercise (any activity that increases your heart rate and causes you to sweat) each week. In addition, most adults need muscle-strengthening exercises on 2 or more days a week.  . Maintain a healthy weight. The body mass index (BMI) is a screening tool to identify possible weight problems. It provides an estimate of body fat based on height and weight. Your health care provider can find your BMI and can help you achieve or maintain a healthy weight.   For adults 20 years and older: ? A BMI below 18.5 is considered underweight. ? A BMI of 18.5 to 24.9 is normal. ? A BMI of 25 to 29.9 is considered overweight. ? A BMI of 30 and above is considered obese.   . Maintain normal blood lipids and cholesterol levels by exercising and minimizing your intake of saturated fat. Eat a balanced diet with plenty of fruit and vegetables. Blood tests for lipids and cholesterol should begin at age 6 and be repeated every 5 years. If your lipid or cholesterol levels are high, you are over 50, or you  are at high risk for heart disease, you may need your cholesterol levels checked more frequently. Ongoing high lipid and cholesterol levels should be treated with medicines if diet and exercise are not working.  . If you smoke, find out from your health care provider how to quit. If you do not use tobacco, please do not start.  . If you choose to drink alcohol, please do not consume more than 2 drinks per day. One drink is considered to be 12 ounces (355 mL) of beer, 5 ounces (148 mL) of wine, or 1.5 ounces (44 mL) of liquor.  . If you are 28-7 years old, ask your health care provider if you should take  aspirin to prevent strokes.  . Osteoporosis is a disease in which the bones lose minerals and strength with aging. This can result in serious bone fractures or breaks. The risk of osteoporosis can be identified using a bone density scan. Women ages 58 years and over and women at risk for fractures or osteoporosis should discuss screening with their health care providers. Ask your health care provider whether you should take a calcium supplement or vitamin D to reduce the rate of osteoporosis.  . Menopause can be associated with physical symptoms and risks. Hormone replacement therapy is available to decrease symptoms and risks. You should talk to your health care provider about whether hormone replacement therapy is right for you.  . Use sunscreen. Apply sunscreen liberally and repeatedly throughout the day. You should seek shade when your shadow is shorter than you. Protect yourself by wearing long sleeves, pants, a wide-brimmed hat, and sunglasses year round, whenever you are outdoors.  . Once a month, do a whole body skin exam, using a mirror to look at the skin on your back. Tell your health care provider of new moles, moles that have irregular borders, moles that are larger than a pencil eraser, or moles that have changed in shape or color.    Hearing Loss Hearing loss is a partial or total loss of the ability to hear. This can be temporary or permanent, and it can happen in one or both ears. Hearing loss may be referred to as deafness. Medical care is necessary to treat hearing loss properly and to prevent the condition from getting worse. Your hearing may partially or completely come back, depending on what caused your hearing loss and how severe it is. In some cases, hearing loss is permanent. CAUSES Common causes of hearing loss include:   Too much wax in the ear canal.   Infection of the ear canal or middle ear.   Fluid in the middle ear.   Injury to the ear or surrounding area.    An object stuck in the ear.   Prolonged exposure to loud sounds, such as music.  Less common causes of hearing loss include:   Tumors in the ear.   Viral or bacterial infections, such as meningitis.   A hole in the eardrum (perforated eardrum).  Problems with the hearing nerve that sends signals between the brain and the ear.  Certain medicines.  SYMPTOMS  Symptoms of this condition may include:  Difficulty telling the difference between sounds.  Difficulty following a conversation when there is background noise.  Lack of response to sounds in your environment. This may be most noticeable when you do not respond to startling sounds.  Needing to turn up the volume on the television, radio, etc.  Ringing in the ears.  Dizziness.  Pain  in the ears. DIAGNOSIS This condition is diagnosed based on a physical exam and a hearing test (audiometry). The audiometry test will be performed by a hearing specialist (audiologist). You may also be referred to an ear, nose, and throat (ENT) specialist (otolaryngologist).  TREATMENT Treatment for recent onset of hearing loss may include:   Ear wax removal.   Being prescribed medicines to prevent infection (antibiotics).   Being prescribed medicines to reduce inflammation (corticosteroids).  HOME CARE INSTRUCTIONS  If you were prescribed an antibiotic medicine, take it as told by your health care provider. Do not stop taking the antibiotic even if you start to feel better.  Take over-the-counter and prescription medicines only as told by your health care provider.  Avoid loud noises.   Return to your normal activities as told by your health care provider. Ask your health care provider what activities are safe for you.  Keep all follow-up visits as told by your health care provider. This is important. SEEK MEDICAL CARE IF:   You feel dizzy.   You develop new symptoms.   You vomit or feel nauseous.   You have a  fever.  SEEK IMMEDIATE MEDICAL CARE IF:  You develop sudden changes in your vision.   You have severe ear pain.   You have new or increased weakness.  You have a severe headache.   This information is not intended to replace advice given to you by your health care provider. Make sure you discuss any questions you have with your health care provider.   Document Released: 05/11/2005 Document Revised: 01/30/2015 Document Reviewed: 09/26/2014 Elsevier Interactive Patient Education [employer].

## 2015-08-02 NOTE — Progress Notes (Signed)
Subjective:   Lauren [name] is a 82 y.o. female who presents for Medicare Annual (Subsequent) preventive examination.   Cardiac Risk Factors include: advanced age (>56men, >49 women);dyslipidemia;diabetes mellitus     Objective:     Vitals: BP 112/68 mmHg  Pulse 78  Temp(Src) 97.9 F (36.6 C) (Oral)  Ht 5' 2.5" (1.588 m)  Wt 120 lb 8 oz (54.658 kg)  BMI 21.67 kg/m2  SpO2 98%  Tobacco History  Smoking status  . Never Smoker   Smokeless tobacco  . Never Used     Counseling given: No   Past Medical History  Diagnosis Date  . Diabetes mellitus   . Gout     injection in the past  . Fibrocystic breast   . Venous insufficiency   . Hyperlipidemia   . TIA (transient ischemic attack) 08/2008    neg MRI/MRA and CT and carotid dopplers  . Chronic constipation   . Gastritis   . Chronic headaches   . Pelvic fracture ([place])   . Breast cancer ([place])     left breast  . Melanoma in situ of left upper extremity [facility])    Past Surgical History  Procedure Laterality Date  . Breast cyst aspiration    . Eye surgery      retinal, then cataract  . Breast lumpectomy  09/2007    with sentinel LN biopsy  . Appendectomy    . Breast lumpectomy      left   Family History  Problem Relation Age of Onset  . Breast cancer Sister   . Stroke Sister   . Cancer Sister     breast  . Breast cancer Sister   . Stroke Sister   . Cancer Sister     breast  . Stroke Mother   . Stroke Father   . Stroke Brother   . Cancer Brother     prostate  . Stroke Brother   . Stroke Sister   . CAD Sister    History  Sexual Activity  . Sexual Activity: Not Currently    Outpatient Encounter Prescriptions as of 08/02/2015  Medication Sig  . citalopram (CELEXA) 10 MG tablet Take 0.5 tablets (5 mg total) by mouth daily. TAKE ONE (1) TABLET BY MOUTH EVERY DAY  . clopidogrel (PLAVIX) 75 MG tablet TAKE ONE TABLET BY MOUTH EACH MORNING WITH BREAKFAST  . fish oil-omega-3 fatty acids 1000 MG capsule  Take 1 g by mouth daily.    Marland Kitchen glucose blood (ONE TOUCH ULTRA TEST) test strip USE AS DIRECTED TO CHECK BLOOD SUGAR ONCE A DAY AND AS NEEDED (DX. E11.9)  . Multiple Vitamins-Minerals (PRESERVISION AREDS 2 PO) Take 1 tablet by mouth 2 (two) times daily.  Marland Kitchen omeprazole (PRILOSEC) 20 MG capsule Take 20 mg by mouth daily.  . polyethylene glycol powder (GLYCOLAX/MIRALAX) powder DISSOLVE ONE (1) CAPFUL (17GM) INTO 4 TO8 OUNCES OF FLUID AND TAKE BYMOUTH ONCE DAILY AS DIRECTED  . ranitidine (ZANTAC) 150 MG tablet Take 150 mg by mouth as needed for heartburn (takes 1 tablet in evening as needed).  . simethicone (MYLICON) 80 MG chewable tablet Chew 80 mg by mouth every 6 (six) hours as needed for flatulence.  . simvastatin (ZOCOR) 20 MG tablet TAKE ONE (1) TABLET BY MOUTH EVERY NIGHTAT BEDTIME  . Calcium-Vitamin D 600-200 MG-UNIT per tablet Take 1 tablet by mouth 2 (two) times daily. Reported on 08/02/2015  . fluticasone (FLONASE) 50 MCG/ACT nasal spray Place 2 sprays into both nostrils daily. (  Patient not taking: Reported on 08/02/2015)  . Probiotic Product (PROBIOTIC DAILY PO) Take 1 tablet by mouth daily. Reported on 08/02/2015   No facility-administered encounter medications on file as of 08/02/2015.    Activities of Daily Living In your present state of health, do you have any difficulty performing the following activities: 08/02/2015  Hearing? Y  Vision? Y  Difficulty concentrating or making decisions? N  Walking or climbing stairs? N  Dressing or bathing? N  Doing errands, shopping? N  Preparing Food and eating ? N  Using the Toilet? N  In the past six months, have you accidently leaked urine? Y  Do you have problems with loss of bowel control? N  Managing your Medications? N  Managing your Finances? N  Housekeeping or managing your Housekeeping? Y    Patient Care Team: Abner Greenspan, MD as PCP - General (Family Medicine) Hayden Pedro, MD as Consulting Physician (Ophthalmology) Marygrace Drought, MD as Consulting Physician (Ophthalmology) Rolm Bookbinder, MD as Consulting Physician (Dermatology) Carloyn Manner, MD as Referring Physician (Otolaryngology) Nicholas Lose, MD as Consulting Physician (Hematology and Oncology) Wallene Huh, DPM as Consulting Physician (Podiatry)   Provider list updated.  Assessment:      Hearing Screening   125Hz  250Hz  500Hz  1000Hz  2000Hz  4000Hz  8000Hz   Right ear:   0 0 40 0   Left ear:   0 0 40 40    Vision Screening Comments: Last eye exam in Jan 2017 with Dr. Satira Sark   Exercise Activities and Dietary recommendations Current Exercise Habits: Home exercise routine, Type of exercise: walking, Time (Minutes): 30, Frequency (Times/Week): 3 (2-4 days per week depending upon weather), Weekly Exercise (Minutes/Week): 90, Intensity: Mild, Exercise limited by: None identified  Goals    . Increase physical activity     When weather permits, I will walk an extra 1-2 days per week for 30 min daily.      Fall Risk Fall Risk  08/02/2015 05/01/2014 04/04/2013 03/31/2012  Falls in the past year? No No No -  Risk for fall due to : - - - Impaired mobility   Depression Screen PHQ 2/9 Scores 08/02/2015 05/01/2014 04/04/2013 03/31/2012  PHQ - 2 Score 0 0 0 0     Cognitive Testing MMSE - Mini Mental State Exam 08/02/2015  Orientation to time 5  Orientation to Place 5  Registration 3  Attention/ Calculation 5  Recall 3  Language- name 2 objects 0  Language- repeat 1  Language- follow 3 step command 3  Language- read & follow direction 1  Write a sentence 0  Copy design 0  Total score 26    Immunization History  Administered Date(s) Administered  . Influenza Split 03/03/2011, 03/17/2012  . Influenza Whole 05/25/2005, 02/28/2009, 02/20/2010  . Influenza,inj,Quad PF,36+ Mos 02/16/2013, 02/07/2014, 02/04/2015  . Pneumococcal Conjugate-13 05/01/2014  . Pneumococcal Polysaccharide-23 10/18/2008  . Td 10/27/2004  . Zoster 01/13/2013   Screening  Tests Health Maintenance  Topic Date Due  . FOOT EXAM  08/06/2015 (Originally 05/02/2015)  . TETANUS/TDAP  10/24/2015 (Originally 10/28/2014)  . INFLUENZA VACCINE  12/24/2015  . MAMMOGRAM  01/01/2016  . HEMOGLOBIN A1C  02/02/2016  . OPHTHALMOLOGY EXAM  06/11/2016  . URINE MICROALBUMIN  08/01/2016  . DEXA SCAN  Completed  . ZOSTAVAX  Completed  . PNA vac Low Risk Adult  Completed      Plan:    I have personally reviewed the Medicare Annual Wellness questionnaire and have noted the following in  the patient's chart:  A. Medical and social history B. Use of alcohol, tobacco or illicit drugs  C. Current medications and supplements D. Functional ability and status E.  Nutritional status F.  Physical activity G. Advance directives H. List of other physicians I.  Hospitalizations, surgeries, and ER visits in previous 12 months J.  [place] to include hearing, vision, cognitive, depression L. Referrals and appointments - none  In addition, I reviewed preventive protocols, quality metrics, and best practice recommendations specific to patient. A written personalized care plan for preventive services as well as general preventive health recommendations were provided to patient.  See attached scanned questionnaire for additional information.   Signed,   Lindell Noe, MHA, BS, LPN Health Advisor [id number]

## 2015-08-02 NOTE — Telephone Encounter (Signed)
Left voicemail letting pt know Rx sent 

## 2015-08-02 NOTE — Telephone Encounter (Signed)
I sent those refills

## 2015-08-04 NOTE — Progress Notes (Signed)
   Subjective:    Patient ID: Lauren [name], female    DOB: 24-Jul-1927, 82 y.o.   MRN: [id number]  HPI    Review of Systems     Objective:   Physical Exam        Assessment & Plan:  I reviewed health advisor's note, was available for consultation, and agree with documentation and plan.

## 2015-08-06 ENCOUNTER — Encounter: Payer: Self-pay | Admitting: Family Medicine

## 2015-08-06 ENCOUNTER — Ambulatory Visit (INDEPENDENT_AMBULATORY_CARE_PROVIDER_SITE_OTHER): Payer: Medicare Other | Admitting: Family Medicine

## 2015-08-06 VITALS — BP 110/64 | HR 84 | Temp 97.7°F | Ht 62.5 in | Wt 121.8 lb

## 2015-08-06 DIAGNOSIS — E119 Type 2 diabetes mellitus without complications: Secondary | ICD-10-CM

## 2015-08-06 DIAGNOSIS — E7800 Pure hypercholesterolemia, unspecified: Secondary | ICD-10-CM

## 2015-08-06 DIAGNOSIS — H9190 Unspecified hearing loss, unspecified ear: Secondary | ICD-10-CM | POA: Insufficient documentation

## 2015-08-06 DIAGNOSIS — Z1211 Encounter for screening for malignant neoplasm of colon: Secondary | ICD-10-CM | POA: Diagnosis not present

## 2015-08-06 DIAGNOSIS — M8580 Other specified disorders of bone density and structure, unspecified site: Secondary | ICD-10-CM | POA: Diagnosis not present

## 2015-08-06 DIAGNOSIS — K219 Gastro-esophageal reflux disease without esophagitis: Secondary | ICD-10-CM

## 2015-08-06 DIAGNOSIS — H9193 Unspecified hearing loss, bilateral: Secondary | ICD-10-CM

## 2015-08-06 MED ORDER — PANTOPRAZOLE SODIUM 40 MG PO TBEC: 40.0000 mg | DELAYED_RELEASE_TABLET | Freq: Every day | ORAL | Status: DC

## 2015-08-06 MED ORDER — METFORMIN HCL 500 MG PO TABS: 250.0000 mg | ORAL_TABLET | Freq: Two times a day (BID) | ORAL | Status: DC

## 2015-08-06 MED ORDER — CITALOPRAM HYDROBROMIDE 10 MG PO TABS: 5.0000 mg | ORAL_TABLET | Freq: Every day | ORAL | Status: DC

## 2015-08-06 NOTE — Patient Instructions (Addendum)
Gas ex and zantac are fine Change from omeprazole to protonix 40 mg once daily - lets see if this improves your GI symptoms Watch diet for spicy food and fatty food and carbonation  If your hearing loss bothers you - we can refer you to audiology at any time -let us know  Start metformin 250 mg twice daily - breakfast and dinner  Take a look at the handout I gave regarding a tetanus shot- you are due for one   Follow up in 3 months with labs prior

## 2015-08-06 NOTE — Progress Notes (Signed)
Subjective:    Patient ID: Lauren [name], female    DOB: 1928-03-16, 82 y.o.   MRN: [id number]  HPI Here for f/u of chronic medical problems   Feeling pretty good  Stomach problems as usual  Gas/ full of gas and bloated all the time  Takes gas ex - helps a little  Also added zantac - at night 150 mg and that helps too  Wonders if it is omeprazole  Sometimes has heartburn and more burning in upper abdomen   No soft drinks  No spicy foods   Trying to stay active / walks some and knows what is safe    Reviewed AMW with Katha Cabal this week Hearing was impaired on screening  She has not really noticed this herself - has discussed with her family  Declines audiology ref or hearing aide at this point   Wt is stable  bmi of 21   dexa 1/16- osteopenia of FN unchanged  Hx of pelvic fx in distant past  Last D level in the  Still taking ca and D  No falls or fractures in the past year    Colon screen Neg ifob kit 12/15   Hx of breast cancer  Mm was 8/17 -nl  Has yearly f/u in survivorship clinic No lumps or changes on self exam   Due for Tetanus shot utd other imms   DM2 Lab Results  Component Value Date   HGBA1C 7.0* 08/02/2015  diet is the same - watches sugars  Unchanged microalbumin ok  opthy 1/17 ok -some retinopathy  Is willing to try metformin again (was on it in the past)   Cholesterol Lab Results  Component Value Date   CHOL 161 08/02/2015   CHOL 173 01/30/2015   CHOL 191 04/24/2014   Lab Results  Component Value Date   HDL 38.30* 08/02/2015   HDL 36.30* 01/30/2015   HDL 29.30* 04/24/2014   Lab Results  Component Value Date   LDLCALC 85 08/02/2015   LDLCALC 99 01/30/2015   LDLCALC 113* 03/28/2013   Lab Results  Component Value Date   TRIG 190.0* 08/02/2015   TRIG 187.0* 01/30/2015   TRIG 233.0* 04/24/2014   Lab Results  Component Value Date   CHOLHDL 4 08/02/2015   CHOLHDL 5 01/30/2015   CHOLHDL 7 04/24/2014   Lab Results    Component Value Date   LDLDIRECT 76.2 04/24/2014   LDLDIRECT 46.3 10/01/2009   No big change / a little improved Simvastatin and diet help   Patient Active Problem List   Diagnosis Date Noted  . GERD (gastroesophageal reflux disease) 08/06/2015  . Hearing loss 08/06/2015  . Viral syndrome 04/29/2015  . Estrogen deficiency 05/01/2014  . Left knee pain 09/22/2013  . Chest pain, musculoskeletal 08/05/2013  . Pneumonia involving right lung 07/12/2013  . Encounter for Medicare annual wellness exam 04/04/2013  . Colon cancer screening 04/04/2013  . Hypoglycemia 10/27/2012  . History of melanoma excision 09/27/2012  . History of breast cancer, left 01/08/2011  . Pelvic fracture ([place]) 09/17/2010  . DEGENERATIVE JOINT DISEASE 02/13/2010  . MIGRAINE VARIANT 11/06/2009  . Osteopenia 10/18/2008  . Breast cancer, left breast ([place]) 09/10/2008  . TIA 09/10/2008  . Diabetes type 2, controlled ([place]) 10/06/2007  . VENOUS INSUFFICIENCY 10/06/2007  . DIVERTICULOSIS, COLON 10/06/2007  . FIBROCYSTIC BREAST DISEASE 10/06/2007  . HYPERCHOLESTEROLEMIA, PURE 11/29/2006   Past Medical History  Diagnosis Date  . Diabetes mellitus   . Gout  injection in the past  . Fibrocystic breast   . Venous insufficiency   . Hyperlipidemia   . TIA (transient ischemic attack) 08/2008    neg MRI/MRA and CT and carotid dopplers  . Chronic constipation   . Gastritis   . Chronic headaches   . Pelvic fracture ([place])   . Breast cancer ([place])     left breast  . Melanoma in situ of left upper extremity [facility])    Past Surgical History  Procedure Laterality Date  . Breast cyst aspiration    . Eye surgery      retinal, then cataract  . Breast lumpectomy  09/2007    with sentinel LN biopsy  . Appendectomy    . Breast lumpectomy      left   Social History  Substance Use Topics  . Smoking status: Never Smoker   . Smokeless tobacco: Never Used  . Alcohol Use: No   Family History  Problem Relation Age of  Onset  . Breast cancer Sister   . Stroke Sister   . Cancer Sister     breast  . Breast cancer Sister   . Stroke Sister   . Cancer Sister     breast  . Stroke Mother   . Stroke Father   . Stroke Brother   . Cancer Brother     prostate  . Stroke Brother   . Stroke Sister   . CAD Sister    Allergies  Allergen Reactions  . Sulfonamide Derivatives Itching  . Aspirin Other (See Comments)    Burning and pain in stomach   Current Outpatient Prescriptions on File Prior to Visit  Medication Sig Dispense Refill  . Calcium-Vitamin D 600-200 MG-UNIT per tablet Take 1 tablet by mouth 2 (two) times daily. Reported on 08/02/2015    . clopidogrel (PLAVIX) 75 MG tablet TAKE ONE TABLET BY MOUTH EACH MORNING WITH BREAKFAST 30 tablet 11  . fish oil-omega-3 fatty acids 1000 MG capsule Take 1 g by mouth daily.      Marland Kitchen glucose blood (ONE TOUCH ULTRA TEST) test strip USE AS DIRECTED TO CHECK BLOOD SUGAR ONCE A DAY AND AS NEEDED (DX. E11.9) 100 each 1  . Multiple Vitamins-Minerals (PRESERVISION AREDS 2 PO) Take 1 tablet by mouth 2 (two) times daily.    . polyethylene glycol powder (GLYCOLAX/MIRALAX) powder DISSOLVE ONE (1) CAPFUL (17GM) INTO 4 TO8 OUNCES OF FLUID AND TAKE BYMOUTH ONCE DAILY AS DIRECTED 527 g 11  . ranitidine (ZANTAC) 150 MG tablet Take 150 mg by mouth as needed for heartburn (takes 1 tablet in evening as needed).    . simethicone (MYLICON) 80 MG chewable tablet Chew 80 mg by mouth every 6 (six) hours as needed for flatulence.    . simvastatin (ZOCOR) 20 MG tablet TAKE ONE (1) TABLET BY MOUTH EVERY NIGHTAT BEDTIME 30 tablet 11   No current facility-administered medications on file prior to visit.    Review of Systems Review of Systems  Constitutional: Negative for fever, appetite change, fatigue and unexpected weight change.  Eyes: Negative for pain and visual disturbance.  Respiratory: Negative for cough and shortness of breath.   Cardiovascular: Negative for cp or palpitations      Gastrointestinal: Negative for nausea, diarrhea and constipation. pos for epigastric burning and gas Genitourinary: Negative for urgency and frequency.  Skin: Negative for pallor or rash   Neurological: Negative for weakness, light-headedness, numbness and headaches.  Hematological: Negative for adenopathy. Does not bruise/bleed easily.  Psychiatric/Behavioral:  Negative for dysphoric mood. The patient is sometimes  nervous/anxious.         Objective:   Physical Exam  Constitutional: She appears well-developed and well-nourished. No distress.  Well appearing   HENT:  Head: Normocephalic and atraumatic.  Right Ear: External ear normal.  Left Ear: External ear normal.  Mouth/Throat: Oropharynx is clear and moist.  HOH  Eyes: Conjunctivae and EOM are normal. Pupils are equal, round, and reactive to light. No scleral icterus.  Neck: Normal range of motion. Neck supple. No JVD present. Carotid bruit is not present. No thyromegaly present.  Cardiovascular: Normal rate, regular rhythm, normal heart sounds and intact distal pulses.  Exam reveals no gallop.   Pulmonary/Chest: Effort normal and breath sounds normal. No respiratory distress. She has no wheezes. She exhibits no tenderness.  Abdominal: Soft. Bowel sounds are normal. She exhibits no distension, no abdominal bruit and no mass. There is no tenderness. There is no rebound and no guarding.  Baseline tympanic abdomen with nl bs   Genitourinary: No breast swelling, tenderness, discharge or bleeding.  Breast exam: No mass, nodules, thickening, tenderness, bulging, retraction, inflamation, nipple discharge or skin changes noted.  No axillary or clavicular LA.      Musculoskeletal: Normal range of motion. She exhibits no edema or tenderness.  Mild kyphosis   Lymphadenopathy:    She has no cervical adenopathy.  Neurological: She is alert. She has normal reflexes. No cranial nerve deficit. She exhibits normal muscle tone. Coordination normal.   Skin: Skin is warm and dry. No rash noted. No erythema. No pallor.  SKs noted   Psychiatric: She has a normal mood and affect.          Assessment & Plan:   Problem List Items Addressed This Visit      Digestive   GERD (gastroesophageal reflux disease)    Omeprazole is no longer working as well  Will try a switch to protonix 40 mg  Update if not improved       Relevant Medications   pantoprazole (PROTONIX) 40 MG tablet     Endocrine   Diabetes type 2, controlled ([place]) - Primary    Pt is interested in trying metformin again at a low dose  Watch for hypoglycemia Continue DM diet  Lab and f/u 3 mo  Lab Results  Component Value Date   HGBA1C 7.0* 08/02/2015   Some retinopathy on her eye exam      Relevant Medications   metFORMIN (GLUCOPHAGE) 500 MG tablet     Nervous and Auditory   Hearing loss    Offered audiology referral- pt is not interested at this time (yet)        Musculoskeletal and Integument   Osteopenia    Due for dexa 1/18 No falls or recent fx  On ca and D Disc need for calcium/ vitamin D/ wt bearing exercise and bone density test every 2 y to monitor Disc safety/ fracture risk in detail           Other   Colon cancer screening    Neg ifob 2015 Unsure if she wants to continue screening (due to age) Gave her the option of cologuard She will think about it       HYPERCHOLESTEROLEMIA, PURE    Disc goals for lipids and reasons to control them Rev labs with pt Rev low sat fat diet in detail  Continue simvastatin

## 2015-08-06 NOTE — Progress Notes (Signed)
Pre visit review using our clinic review tool, if applicable. No additional management support is needed unless otherwise documented below in the visit note. 

## 2015-08-08 NOTE — Assessment & Plan Note (Signed)
Offered audiology referral- pt is not interested at this time (yet)

## 2015-08-08 NOTE — Assessment & Plan Note (Signed)
Due for dexa 1/18 No falls or recent fx  On ca and D Disc need for calcium/ vitamin D/ wt bearing exercise and bone density test every 2 y to monitor Disc safety/ fracture risk in detail

## 2015-08-08 NOTE — Assessment & Plan Note (Signed)
Omeprazole is no longer working as well  Will try a switch to protonix 40 mg  Update if not improved

## 2015-08-08 NOTE — Assessment & Plan Note (Signed)
Neg ifob 2015 Unsure if she wants to continue screening (due to age) Gave her the option of cologuard She will think about it

## 2015-08-08 NOTE — Assessment & Plan Note (Signed)
Disc goals for lipids and reasons to control them Rev labs with pt Rev low sat fat diet in detail Continue simvastatin  

## 2015-08-08 NOTE — Assessment & Plan Note (Signed)
Pt is interested in trying metformin again at a low dose  Watch for hypoglycemia Continue DM diet  Lab and f/u 3 mo  Lab Results  Component Value Date   HGBA1C 7.0* 08/02/2015   Some retinopathy on her eye exam

## 2015-08-29 DIAGNOSIS — L918 Other hypertrophic disorders of the skin: Secondary | ICD-10-CM | POA: Diagnosis not present

## 2015-08-29 DIAGNOSIS — D485 Neoplasm of uncertain behavior of skin: Secondary | ICD-10-CM | POA: Diagnosis not present

## 2015-08-29 DIAGNOSIS — Z85820 Personal history of malignant melanoma of skin: Secondary | ICD-10-CM | POA: Diagnosis not present

## 2015-08-29 DIAGNOSIS — L814 Other melanin hyperpigmentation: Secondary | ICD-10-CM | POA: Diagnosis not present

## 2015-08-29 DIAGNOSIS — D692 Other nonthrombocytopenic purpura: Secondary | ICD-10-CM | POA: Diagnosis not present

## 2015-08-29 DIAGNOSIS — Z85828 Personal history of other malignant neoplasm of skin: Secondary | ICD-10-CM | POA: Diagnosis not present

## 2015-08-29 DIAGNOSIS — L820 Inflamed seborrheic keratosis: Secondary | ICD-10-CM | POA: Diagnosis not present

## 2015-08-29 DIAGNOSIS — D1801 Hemangioma of skin and subcutaneous tissue: Secondary | ICD-10-CM | POA: Diagnosis not present

## 2015-08-29 DIAGNOSIS — D2239 Melanocytic nevi of other parts of face: Secondary | ICD-10-CM | POA: Diagnosis not present

## 2015-08-29 DIAGNOSIS — L821 Other seborrheic keratosis: Secondary | ICD-10-CM | POA: Diagnosis not present

## 2015-09-01 IMAGING — CR DG CHEST 2V
2 series · 2 of 2 positions shown · non-contrast
Comparison: CT 08/14/2013.  Chest x-ray 07/12/2013

CLINICAL DATA: Productive cough.

EXAM:
CHEST  2 VIEW

[view not recorded (1 of 2)]
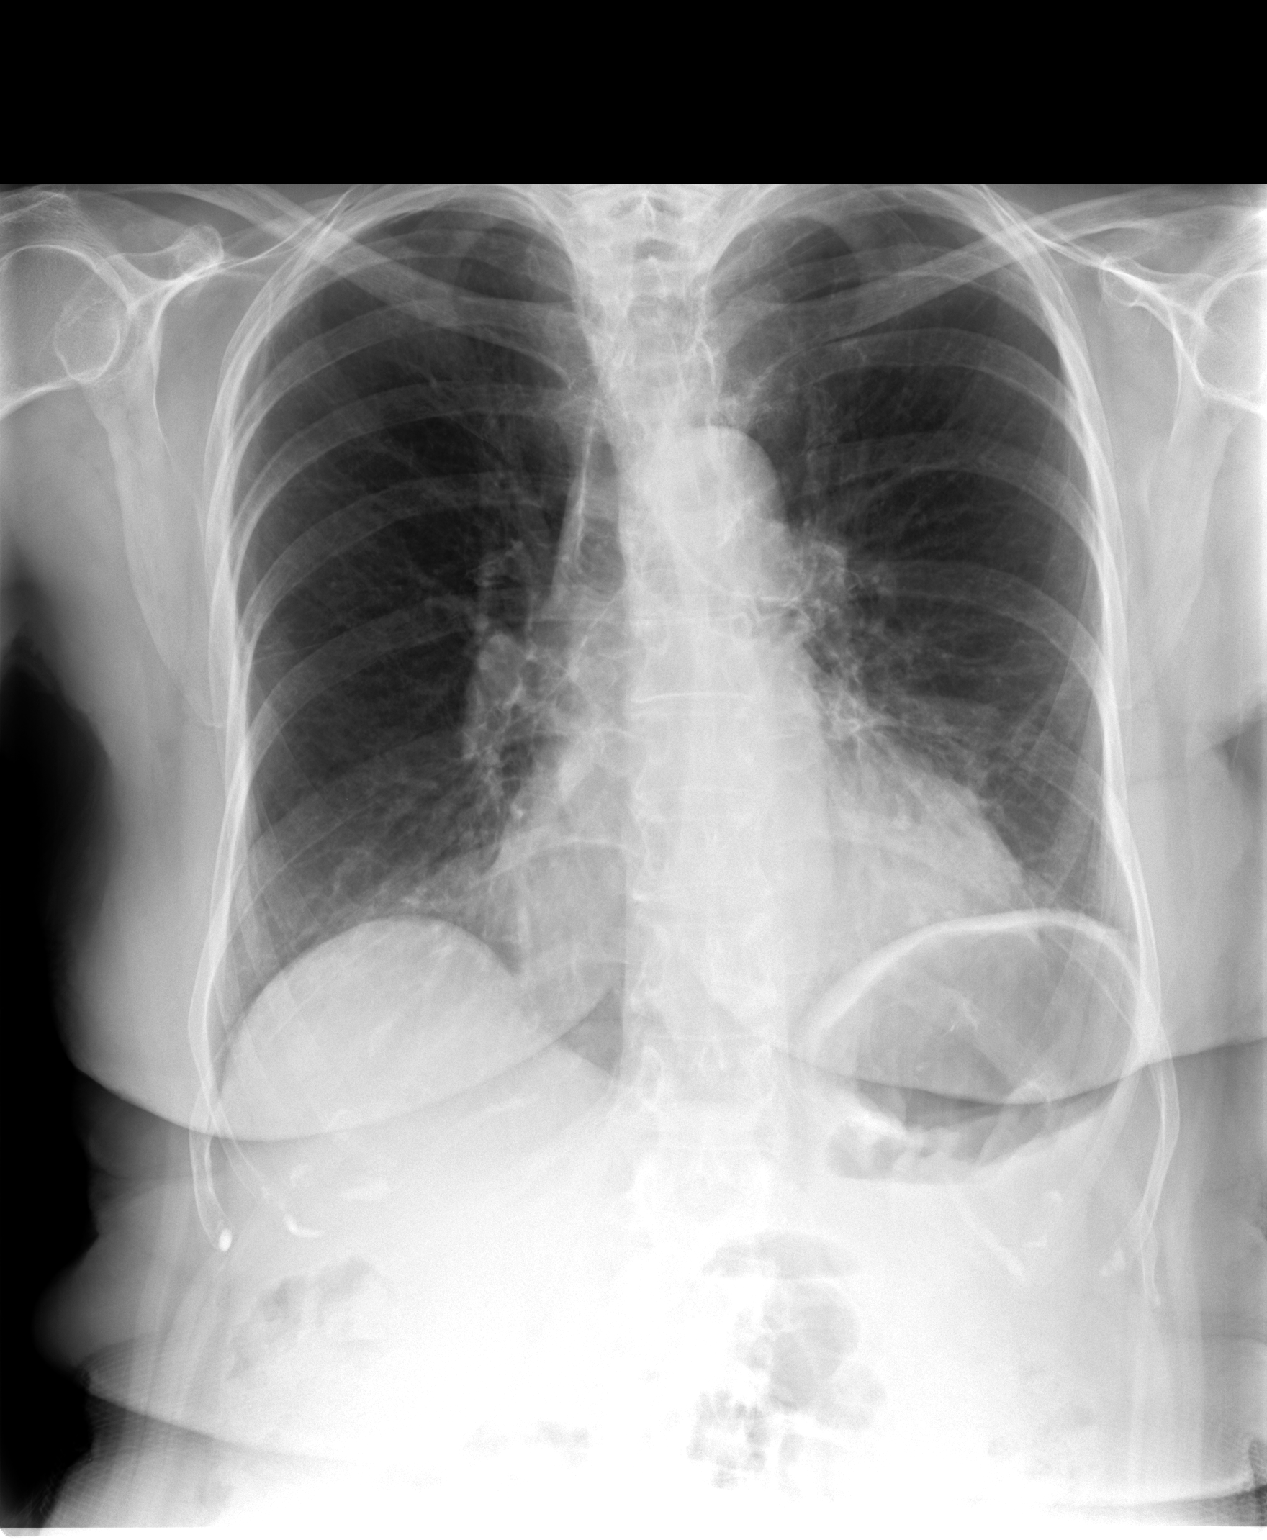

[view not recorded (2 of 2)]
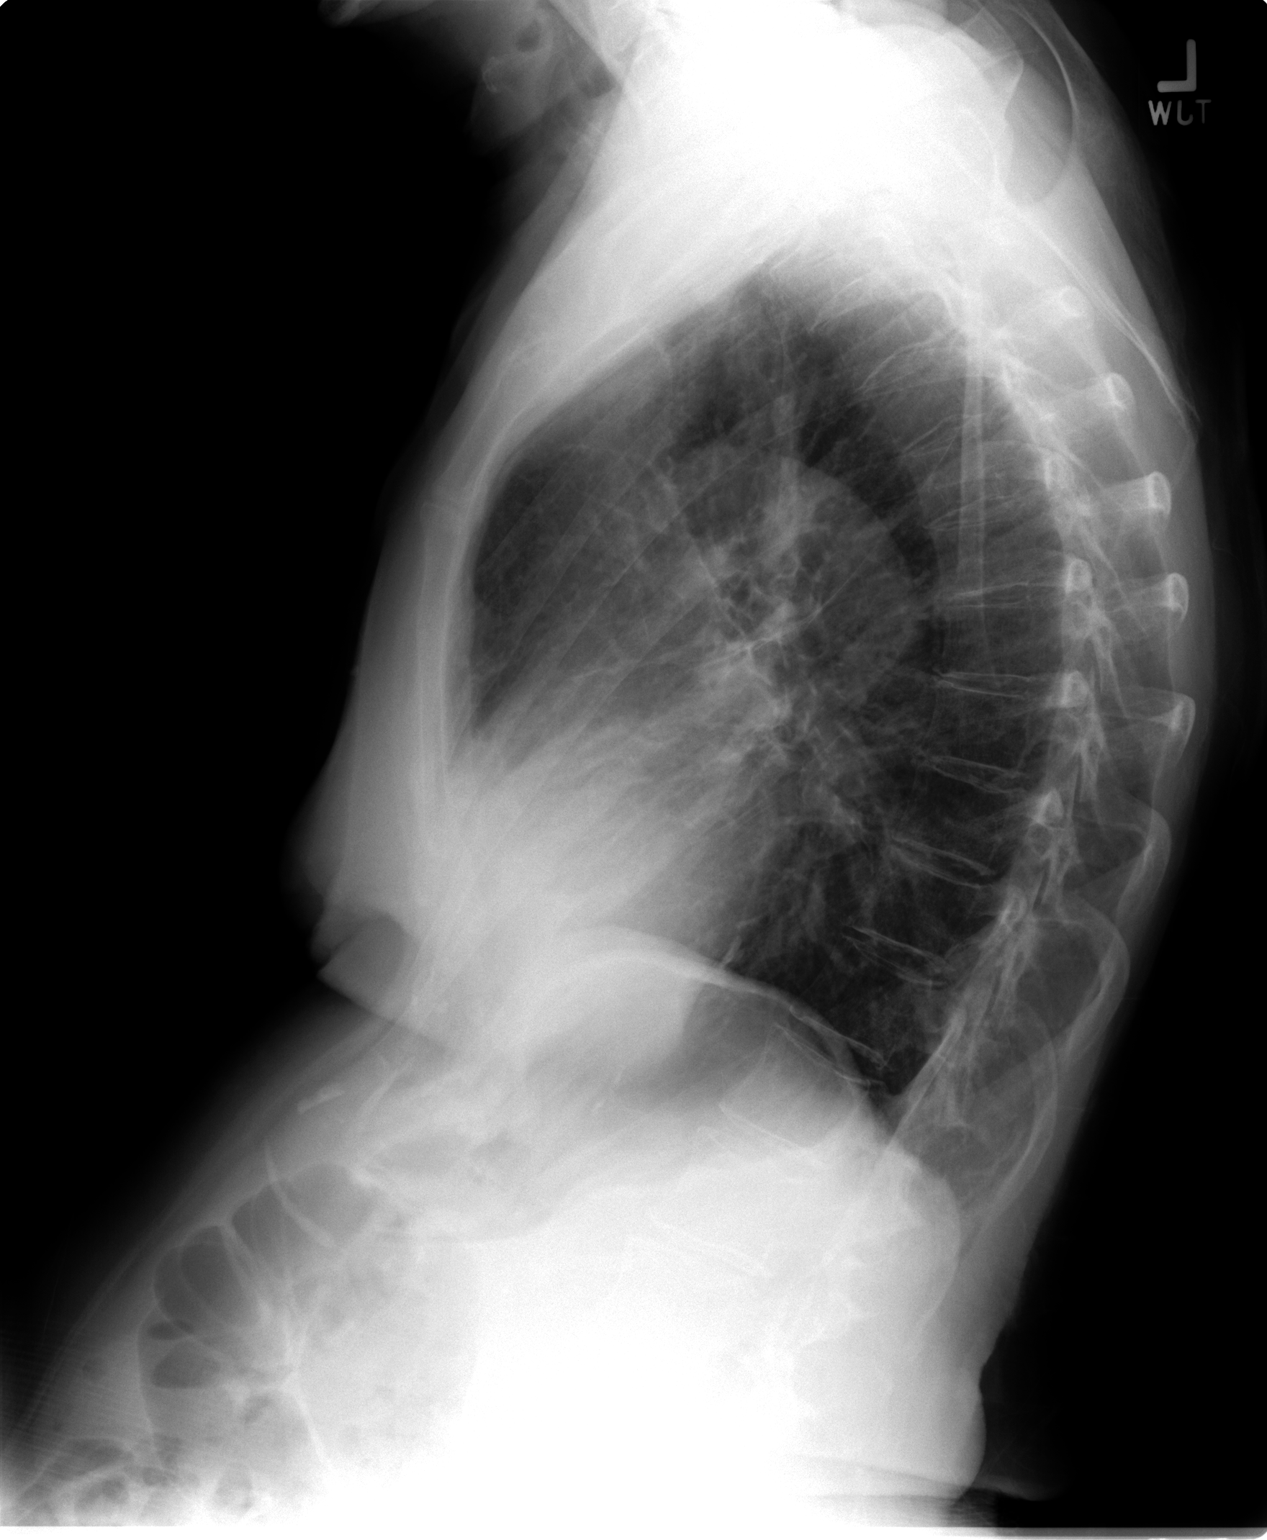

[2 of 2 positions shown; findings below may reference images not displayed]

FINDINGS: Mediastinum and hilar structures are normal. Mild lingular and right
middle lobe pulmonary infiltrates noted consistent with pneumonia.
No pleural effusion or pneumothorax. Stable mild elevation left
hemidiaphragm. Stable cardiomegaly. Normal pulmonary vascularity. No
acute bony abnormality.
IMPRESSION: Mild lingular and right middle lobe infiltrates suggesting mild
pneumonia.

## 2015-09-13 ENCOUNTER — Telehealth: Payer: Self-pay | Admitting: Family Medicine

## 2015-09-13 NOTE — Telephone Encounter (Signed)
While protonix has the best data with plavix- if it is not helping then I think trying nexium is ok  Let me know how it works otc and then we will go from there

## 2015-09-13 NOTE — Telephone Encounter (Signed)
Linda notified of Dr. Marliss Coots recommendations and verbalized understanding. Pt will try the nexium OTC in the morning and keep [place] updated on of it helped or not

## 2015-09-13 NOTE — Telephone Encounter (Signed)
Lauren [name] called - the protonix is not working. Pt has bought nexium over the counter and is asking if she can take that with taking the Plavix? If its ok, and it works for her, can she get a rx for it?  cb number is [phone] for pt

## 2015-10-05 DIAGNOSIS — J209 Acute bronchitis, unspecified: Secondary | ICD-10-CM | POA: Diagnosis not present

## 2015-10-14 ENCOUNTER — Ambulatory Visit (INDEPENDENT_AMBULATORY_CARE_PROVIDER_SITE_OTHER): Payer: Medicare Other | Admitting: Family Medicine

## 2015-10-14 ENCOUNTER — Encounter: Payer: Self-pay | Admitting: Family Medicine

## 2015-10-14 VITALS — BP 138/60 | Temp 98.2°F | Resp 82 | Wt 120.2 lb

## 2015-10-14 DIAGNOSIS — J208 Acute bronchitis due to other specified organisms: Secondary | ICD-10-CM | POA: Diagnosis not present

## 2015-10-14 MED ORDER — HYDROCODONE-HOMATROPINE 5-1.5 MG/5ML PO SYRP: 5.0000 mL | ORAL_SOLUTION | Freq: Three times a day (TID) | ORAL | Status: DC | PRN

## 2015-10-14 NOTE — Progress Notes (Signed)
SUBJECTIVE:  Lauren [name] is a 82 y.o. female pt of Dr. Glori Bickers, new to me, who complains of persistent cough.  Went to UC on 5/13 for cough, congestion, wheezing and fever.  Tmax 100.4.  Placed on prednisone, zpack and cough suppressant.  Finished zpack and took last prednisone dose this morning.  Feels better, but still coughing.  Asking for refill of hycodan.   Current Outpatient Prescriptions on File Prior to Visit  Medication Sig Dispense Refill  . Calcium-Vitamin D 600-200 MG-UNIT per tablet Take 1 tablet by mouth 2 (two) times daily. Reported on 08/02/2015    . citalopram (CELEXA) 10 MG tablet Take 0.5 tablets (5 mg total) by mouth daily. 15 tablet 11  . fish oil-omega-3 fatty acids 1000 MG capsule Take 1 g by mouth daily.      Marland Kitchen glucose blood (ONE TOUCH ULTRA TEST) test strip USE AS DIRECTED TO CHECK BLOOD SUGAR ONCE A DAY AND AS NEEDED (DX. E11.9) 100 each 1  . metFORMIN (GLUCOPHAGE) 500 MG tablet Take 0.5 tablets (250 mg total) by mouth 2 (two) times daily with a meal. 30 tablet 11  . Multiple Vitamins-Minerals (PRESERVISION AREDS 2 PO) Take 1 tablet by mouth 2 (two) times daily.    . polyethylene glycol powder (GLYCOLAX/MIRALAX) powder DISSOLVE ONE (1) CAPFUL (17GM) INTO 4 TO8 OUNCES OF FLUID AND TAKE BYMOUTH ONCE DAILY AS DIRECTED 527 g 11  . ranitidine (ZANTAC) 150 MG tablet Take 150 mg by mouth as needed for heartburn (takes 1 tablet in evening as needed).    . simethicone (MYLICON) 80 MG chewable tablet Chew 80 mg by mouth every 6 (six) hours as needed for flatulence.    . simvastatin (ZOCOR) 20 MG tablet TAKE ONE (1) TABLET BY MOUTH EVERY NIGHTAT BEDTIME 30 tablet 11  . clopidogrel (PLAVIX) 75 MG tablet TAKE ONE TABLET BY MOUTH EACH MORNING WITH BREAKFAST (Patient not taking: Reported on 10/14/2015) 30 tablet 11   No current facility-administered medications on file prior to visit.    Allergies  Allergen Reactions  . Sulfonamide Derivatives Itching  . Aspirin Other (See  Comments)    Burning and pain in stomach    Past Medical History  Diagnosis Date  . Diabetes mellitus   . Gout     injection in the past  . Fibrocystic breast   . Venous insufficiency   . Hyperlipidemia   . TIA (transient ischemic attack) 08/2008    neg MRI/MRA and CT and carotid dopplers  . Chronic constipation   . Gastritis   . Chronic headaches   . Pelvic fracture ([place])   . Breast cancer ([place])     left breast  . Melanoma in situ of left upper extremity [facility])     Past Surgical History  Procedure Laterality Date  . Breast cyst aspiration    . Eye surgery      retinal, then cataract  . Breast lumpectomy  09/2007    with sentinel LN biopsy  . Appendectomy    . Breast lumpectomy      left    Family History  Problem Relation Age of Onset  . Breast cancer Sister   . Stroke Sister   . Cancer Sister     breast  . Breast cancer Sister   . Stroke Sister   . Cancer Sister     breast  . Stroke Mother   . Stroke Father   . Stroke Brother   . Cancer Brother  prostate  . Stroke Brother   . Stroke Sister   . CAD Sister     Social History   Social History  . Marital Status: Single    Spouse Name: N/A  . Number of Children: N/A  . Years of Education: N/A   Occupational History  . Not on file.   Social History Main Topics  . Smoking status: Never Smoker   . Smokeless tobacco: Never Used  . Alcohol Use: No  . Drug Use: No  . Sexual Activity: Not Currently   Other Topics Concern  . Not on file   Social History Narrative   Lives with and cares for both sisters   Strong faith   The PMH, PSH, Social History, Family History, Medications, and allergies have been reviewed in [facility], and have been updated if relevant.  OBJECTIVE: BP 138/60 mmHg  Temp(Src) 98.2 F (36.8 C) (Oral)  Resp 82  Wt 120 lb 4 oz (54.545 kg)  SpO2 95%  She appears well, vital signs are as noted. Ears normal.  Throat and pharynx normal.  Neck supple. No adenopathy in the neck.  Nose is congested. Sinuses non tender. Faint exp wheezes Right upper lung  ASSESSMENT:  bronchitis  PLAN: Exam reassuring- does still have a very faint wheeze.  Hycodan rx refilled. Symptomatic therapy suggested: push fluids, rest and return office visit prn if symptoms persist or worsen.Call or return to clinic prn if these symptoms worsen or fail to improve as anticipated.

## 2015-10-14 NOTE — Progress Notes (Signed)
Pre visit review using our clinic review tool, if applicable. No additional management support is needed unless otherwise documented below in the visit note. 

## 2015-10-28 ENCOUNTER — Telehealth: Payer: Self-pay | Admitting: Nurse Practitioner

## 2015-10-28 NOTE — Telephone Encounter (Signed)
Spoke with patient re 8/16 LTS visit with HM. Per patient visit moved from 8/14 to 8/16. Schedule also mailed.

## 2015-11-07 ENCOUNTER — Other Ambulatory Visit (INDEPENDENT_AMBULATORY_CARE_PROVIDER_SITE_OTHER): Payer: Medicare Other

## 2015-11-07 DIAGNOSIS — E119 Type 2 diabetes mellitus without complications: Secondary | ICD-10-CM | POA: Diagnosis not present

## 2015-11-07 LAB — HEMOGLOBIN A1C: Hgb A1c MFr Bld: 6.7 % — ABNORMAL HIGH (ref 4.6–6.5)

## 2015-11-12 ENCOUNTER — Encounter: Payer: Self-pay | Admitting: Family Medicine

## 2015-11-12 ENCOUNTER — Ambulatory Visit (INDEPENDENT_AMBULATORY_CARE_PROVIDER_SITE_OTHER): Payer: Medicare Other | Admitting: Family Medicine

## 2015-11-12 VITALS — BP 128/62 | HR 76 | Temp 98.2°F | Ht 62.5 in | Wt 118.8 lb

## 2015-11-12 DIAGNOSIS — E11319 Type 2 diabetes mellitus with unspecified diabetic retinopathy without macular edema: Secondary | ICD-10-CM

## 2015-11-12 DIAGNOSIS — K219 Gastro-esophageal reflux disease without esophagitis: Secondary | ICD-10-CM

## 2015-11-12 MED ORDER — NEXIUM 40 MG PO CPDR: 40.0000 mg | DELAYED_RELEASE_CAPSULE | Freq: Every day | ORAL | Status: DC

## 2015-11-12 MED ORDER — RANITIDINE HCL 300 MG PO TABS: 300.0000 mg | ORAL_TABLET | Freq: Every day | ORAL | Status: DC | PRN

## 2015-11-12 NOTE — Progress Notes (Signed)
Pre visit review using our clinic review tool, if applicable. No additional management support is needed unless otherwise documented below in the visit note. 

## 2015-11-12 NOTE — Progress Notes (Signed)
Subjective:    Patient ID: Lauren [name], female    DOB: Nov 12, 1927, 82 y.o.   MRN: [id number]  HPI Here for f/u of chronic health problems   Feeling pretty good overall  Got over a cold since the last visit   Wt is down 2 lb with bmi of 21.3 Walking more now that winter is over    Diabetes Home sugar results - no lows or highs (lowest was 99)  DM diet -about the same  Exercise -better  Symptoms-none  A1C last  Lab Results  Component Value Date   HGBA1C 6.7* 11/07/2015  this is down from 7.0  No problems with medications  Renal protection-none - has had too low bp at times to tolerate ace or arb Lab Results  Component Value Date   MICROALBUR <0.7 08/02/2015  tries to drink water   Last eye exam - 1/ 17 with retinopathy   BP Readings from Last 3 Encounters:  11/12/15 128/62  10/14/15 138/60  08/06/15 110/64     GERD Needs DAW for nexium (ins covers it better as brand), and generic zantac  Stomach bothers her still on and off -declines GI follow up  Is also on plavix - aware the nexium can dec effectiveness protonix does not help much She eats fairly well /no particular food make it worse   Needs handicapped form filled out today  Patient Active Problem List   Diagnosis Date Noted  . GERD (gastroesophageal reflux disease) 08/06/2015  . Hearing loss 08/06/2015  . Estrogen deficiency 05/01/2014  . Left knee pain 09/22/2013  . Chest pain, musculoskeletal 08/05/2013  . Pneumonia involving right lung 07/12/2013  . Encounter for Medicare annual wellness exam 04/04/2013  . Colon cancer screening 04/04/2013  . Hypoglycemia 10/27/2012  . History of melanoma excision 09/27/2012  . History of breast cancer, left 01/08/2011  . History of pelvic fracture 09/17/2010  . DEGENERATIVE JOINT DISEASE 02/13/2010  . MIGRAINE VARIANT 11/06/2009  . Osteopenia 10/18/2008  . Breast cancer, left breast ([place]) 09/10/2008  . TIA 09/10/2008  . Diabetes type 2, controlled ([place])  10/06/2007  . VENOUS INSUFFICIENCY 10/06/2007  . DIVERTICULOSIS, COLON 10/06/2007  . FIBROCYSTIC BREAST DISEASE 10/06/2007  . HYPERCHOLESTEROLEMIA, PURE 11/29/2006   Past Medical History  Diagnosis Date  . Diabetes mellitus   . Gout     injection in the past  . Fibrocystic breast   . Venous insufficiency   . Hyperlipidemia   . TIA (transient ischemic attack) 08/2008    neg MRI/MRA and CT and carotid dopplers  . Chronic constipation   . Gastritis   . Chronic headaches   . Pelvic fracture ([place])   . Breast cancer ([place])     left breast  . Melanoma in situ of left upper extremity [facility])    Past Surgical History  Procedure Laterality Date  . Breast cyst aspiration    . Eye surgery      retinal, then cataract  . Breast lumpectomy  09/2007    with sentinel LN biopsy  . Appendectomy    . Breast lumpectomy      left   Social History  Substance Use Topics  . Smoking status: Never Smoker   . Smokeless tobacco: Never Used  . Alcohol Use: No   Family History  Problem Relation Age of Onset  . Breast cancer Sister   . Stroke Sister   . Cancer Sister     breast  . Breast cancer Sister   .  Stroke Sister   . Cancer Sister     breast  . Stroke Mother   . Stroke Father   . Stroke Brother   . Cancer Brother     prostate  . Stroke Brother   . Stroke Sister   . CAD Sister    Allergies  Allergen Reactions  . Sulfonamide Derivatives Itching  . Aspirin Other (See Comments)    Burning and pain in stomach   Current Outpatient Prescriptions on File Prior to Visit  Medication Sig Dispense Refill  . Calcium-Vitamin D 600-200 MG-UNIT per tablet Take 1 tablet by mouth 2 (two) times daily. Reported on 08/02/2015    . citalopram (CELEXA) 10 MG tablet Take 0.5 tablets (5 mg total) by mouth daily. 15 tablet 11  . clopidogrel (PLAVIX) 75 MG tablet TAKE ONE TABLET BY MOUTH EACH MORNING WITH BREAKFAST 30 tablet 11  . fish oil-omega-3 fatty acids 1000 MG capsule Take 1 g by mouth daily.       Marland Kitchen glucose blood (ONE TOUCH ULTRA TEST) test strip USE AS DIRECTED TO CHECK BLOOD SUGAR ONCE A DAY AND AS NEEDED (DX. E11.9) 100 each 1  . metFORMIN (GLUCOPHAGE) 500 MG tablet Take 0.5 tablets (250 mg total) by mouth 2 (two) times daily with a meal. 30 tablet 11  . Multiple Vitamins-Minerals (PRESERVISION AREDS 2 PO) Take 1 tablet by mouth 2 (two) times daily.    . polyethylene glycol powder (GLYCOLAX/MIRALAX) powder DISSOLVE ONE (1) CAPFUL (17GM) INTO 4 TO8 OUNCES OF FLUID AND TAKE BYMOUTH ONCE DAILY AS DIRECTED 527 g 11  . simethicone (MYLICON) 80 MG chewable tablet Chew 80 mg by mouth every 6 (six) hours as needed for flatulence.    . simvastatin (ZOCOR) 20 MG tablet TAKE ONE (1) TABLET BY MOUTH EVERY NIGHTAT BEDTIME 30 tablet 11   No current facility-administered medications on file prior to visit.    Review of Systems Review of Systems  Constitutional: Negative for fever, appetite change, fatigue and unexpected weight change.  Eyes: Negative for pain and visual disturbance.  Respiratory: Negative for cough and shortness of breath.   Cardiovascular: Negative for cp or palpitations    Gastrointestinal: Negative for nausea, diarrhea and constipation.  Genitourinary: Negative for urgency and frequency.  Skin: Negative for pallor or rash   Neurological: Negative for weakness, light-headedness, numbness and headaches.  Hematological: Negative for adenopathy. Does not bruise/bleed easily.  Psychiatric/Behavioral: Negative for dysphoric mood. The patient is not nervous/anxious.         Objective:   Physical Exam  Constitutional: She appears well-developed and well-nourished. No distress.  Well appearing elderly female  HENT:  Head: Normocephalic and atraumatic.  Mouth/Throat: Oropharynx is clear and moist.  Eyes: Conjunctivae and EOM are normal. Pupils are equal, round, and reactive to light.  Neck: Normal range of motion. Neck supple. No JVD present. Carotid bruit is not present. No  thyromegaly present.  Cardiovascular: Normal rate, regular rhythm, normal heart sounds and intact distal pulses.  Exam reveals no gallop.   Pulmonary/Chest: Effort normal and breath sounds normal. No respiratory distress. She has no wheezes. She has no rales.  No crackles  Abdominal: Soft. Bowel sounds are normal. She exhibits no distension, no abdominal bruit and no mass. There is no tenderness.  Musculoskeletal: She exhibits no edema.  Lymphadenopathy:    She has no cervical adenopathy.  Neurological: She is alert. She has normal reflexes.  Skin: Skin is warm and dry. No rash noted. No pallor.  Psychiatric: She has a normal mood and affect.          Assessment & Plan:   Problem List Items Addressed This Visit      Digestive   GERD (gastroesophageal reflux disease)    Pt can get nexium brand name cheaper-will refill that  Also zantac prn qhs In the past she has needed both for control  Strongly recommend GI f/u if symptoms are not controlled (she also has baseline bloating0      Relevant Medications   NEXIUM 40 MG capsule   ranitidine (ZANTAC) 300 MG tablet     Endocrine   Diabetes type 2, controlled ([place]) - Primary    A1C is improved with some diet changes and also more exercise Lab Results  Component Value Date   HGBA1C 6.7* 11/07/2015    utd opthy and microalbumin        Relevant Orders   Comprehensive metabolic panel   Hemoglobin A1c   Lipid panel

## 2015-11-12 NOTE — Assessment & Plan Note (Signed)
A1C is improved with some diet changes and also more exercise Lab Results  Component Value Date   HGBA1C 6.7* 11/07/2015    utd opthy and microalbumin

## 2015-11-12 NOTE — Patient Instructions (Addendum)
Take care of yourself  Stay active Eat a healthy diet  A1C is improved  Follow up in about 6 months with labs prior

## 2015-11-12 NOTE — Assessment & Plan Note (Signed)
Pt can get nexium brand name cheaper-will refill that  Also zantac prn qhs In the past she has needed both for control  Strongly recommend GI f/u if symptoms are not controlled (she also has baseline bloating0

## 2015-11-27 ENCOUNTER — Other Ambulatory Visit: Payer: Self-pay

## 2015-11-27 MED ORDER — METFORMIN HCL 500 MG PO TABS: 250.0000 mg | ORAL_TABLET | Freq: Two times a day (BID) | ORAL | Status: DC

## 2015-12-06 ENCOUNTER — Other Ambulatory Visit: Payer: Self-pay | Admitting: Hematology and Oncology

## 2015-12-06 DIAGNOSIS — Z853 Personal history of malignant neoplasm of breast: Secondary | ICD-10-CM

## 2016-01-03 ENCOUNTER — Ambulatory Visit
Admission: RE | Admit: 2016-01-03 | Discharge: 2016-01-03 | Disposition: A | Discharge status: Home / Self Care | Payer: Medicare Other | Source: Ambulatory Visit | Attending: Hematology and Oncology | Admitting: Hematology and Oncology

## 2016-01-03 DIAGNOSIS — R928 Other abnormal and inconclusive findings on diagnostic imaging of breast: Secondary | ICD-10-CM | POA: Diagnosis not present

## 2016-01-03 DIAGNOSIS — Z853 Personal history of malignant neoplasm of breast: Secondary | ICD-10-CM

## 2016-01-03 LAB — HM MAMMOGRAPHY

## 2016-01-06 ENCOUNTER — Encounter: Payer: Medicare Other | Admitting: Nurse Practitioner

## 2016-01-07 ENCOUNTER — Encounter: Payer: Self-pay | Admitting: *Deleted

## 2016-01-08 ENCOUNTER — Encounter: Payer: Medicare Other | Admitting: Nurse Practitioner

## 2016-01-14 NOTE — Progress Notes (Signed)
CLINIC:  Survivorship   REASON FOR VISIT:  Routine follow-up for history of breast cancer.   BRIEF ONCOLOGIC HISTORY:    Breast cancer, left breast ([place])   11/08/2007 Surgery    Left breast lumpectomy: 0.8 cm invasive ductal carcinoma grade 1 with intermediate grade DCIS, 0/2 sentinel lymph nodes, LVI present, ER 73% PR 93% Ki-67 10% HER-2/neu negative T1b N0 M0 stage IA      11/22/2007 - 11/21/2012 Anti-estrogen oral therapy    Femara 2.5 mg daily      06/06/2014 Imaging    DEXA scan: Osteopenia      01/03/2016 Mammogram    No evidence of malignancy within either breast. Stable postsurgical changes.       INTERVAL HISTORY:  Lauren [name] presents to the Survivorship Clinic today for routine follow-up for her history of breast cancer.  Overall, she reports feeling quite well. She has some fatigue. She enjoys walking regularly. She has occasional headaches, but these are chronic and have not worsened since her last visit here.  She reports having several abnormal moles on her body; she has a history of melanoma removal and sees her dermatologist every 6 months.  She has occasional heartburn and abdominal pain, which she associates with occasionally constipation. She dribbles urine, which is chronic as well.    She tells me that she has a history of having several cysts aspirated and then removed in her (R) breast when she lived in [place], [place]; this was several years ago.  She's had no issues since that time.    Overall, she feels very well and continues to help care for her sister who lives with her.     REVIEW OF SYSTEMS:  Review of Systems  Constitutional: Positive for malaise/fatigue.       Some fatigue; she does enjoy walking outside and in her basement when the weather is too cold  HENT: Positive for hearing loss.        Chronic headaches  Eyes: Negative.   Respiratory: Negative.   Cardiovascular: Negative.   Gastrointestinal: Positive for abdominal pain and heartburn.    Genitourinary:       Dribbling urine   Skin:       Abnormal moles; sees Dermatologist regularly   Neurological: Positive for headaches.  GU: Denies vaginal bleeding, discharge, or dryness.  Breast: Denies any new nodularity, masses, tenderness, nipple changes, or nipple discharge.    A 14-point review of systems was completed and was negative, except as noted above.    PAST MEDICAL/SURGICAL HISTORY:  Past Medical History:  Diagnosis Date  . Breast cancer ([place])    left breast  . Chronic constipation   . Chronic headaches   . Diabetes mellitus   . Fibrocystic breast   . Gastritis   . Gout    injection in the past  . Hyperlipidemia   . Melanoma in situ of left upper extremity ([place])   . Pelvic fracture ([place])   . TIA (transient ischemic attack) 08/2008   neg MRI/MRA and CT and carotid dopplers  . Venous insufficiency    Past Surgical History:  Procedure Laterality Date  . APPENDECTOMY    . BREAST CYST ASPIRATION    . BREAST LUMPECTOMY  09/2007   with sentinel LN biopsy  . BREAST LUMPECTOMY     left  . EYE SURGERY     retinal, then cataract     ALLERGIES:  Allergies  Allergen Reactions  . Sulfonamide Derivatives Itching  .  Aspirin Other (See Comments)    Burning and pain in stomach     CURRENT MEDICATIONS:  Outpatient Encounter Prescriptions as of 01/16/2016  Medication Sig Note  . Calcium-Vitamin D 600-200 MG-UNIT per tablet Take 1 tablet by mouth 2 (two) times daily. Reported on 08/02/2015 08/02/2015: Taking Calcium-Vit D 600-800 tablet twice daily  . citalopram (CELEXA) 10 MG tablet Take 0.5 tablets (5 mg total) by mouth daily.   . clopidogrel (PLAVIX) 75 MG tablet TAKE ONE TABLET BY MOUTH EACH MORNING WITH BREAKFAST   . fish oil-omega-3 fatty acids 1000 MG capsule Take 1 g by mouth daily.     Marland Kitchen glucose blood (ONE TOUCH ULTRA TEST) test strip USE AS DIRECTED TO CHECK BLOOD SUGAR ONCE A DAY AND AS NEEDED (DX. E11.9)   . metFORMIN (GLUCOPHAGE) 500 MG tablet  Take 0.5 tablets (250 mg total) by mouth 2 (two) times daily with a meal.   . Multiple Vitamins-Minerals (PRESERVISION AREDS 2 PO) Take 1 tablet by mouth 2 (two) times daily.   Marland Kitchen NEXIUM 40 MG capsule Take 1 capsule (40 mg total) by mouth daily. Name brand please   . polyethylene glycol powder (GLYCOLAX/MIRALAX) powder DISSOLVE ONE (1) CAPFUL (17GM) INTO 4 TO8 OUNCES OF FLUID AND TAKE BYMOUTH ONCE DAILY AS DIRECTED   . ranitidine (ZANTAC) 300 MG tablet Take 1 tablet (300 mg total) by mouth daily as needed for heartburn.   . simethicone (MYLICON) 80 MG chewable tablet Chew 80 mg by mouth every 6 (six) hours as needed for flatulence.   . simvastatin (ZOCOR) 20 MG tablet TAKE ONE (1) TABLET BY MOUTH EVERY NIGHTAT BEDTIME    No facility-administered encounter medications on file as of 01/16/2016.      ONCOLOGIC FAMILY HISTORY:  Family History  Problem Relation Age of Onset  . Breast cancer Sister   . Stroke Sister   . Cancer Sister     breast  . Breast cancer Sister   . Stroke Sister   . Cancer Sister     breast  . Stroke Mother   . Stroke Father   . Stroke Brother   . Cancer Brother     prostate  . Stroke Brother   . Stroke Sister   . CAD Sister     GENETIC COUNSELING/TESTING: None.  SOCIAL HISTORY:  Lauren [name] is single and lives with her sister in [place], [place].   Ms. 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  She denies any current or history of tobacco, alcohol, or illicit drug use.     PHYSICAL EXAMINATION:  Vital Signs: Vitals:   01/16/16 1017  BP: 136/62  Pulse: 90  Resp: 18  Temp: 98.6 F (37 C)   Filed Weights   01/16/16 1017  Weight: 120 lb 3.2 oz (54.5 kg)   General: Well-nourished, well-appearing female in no acute distress.  She is accompanied by her friend today.  HEENT: Head is normocephalic.   Pupils equal and reactive to light and accomodation. Conjunctivae clear without exudate.  Sclerae anicteric. Oral mucosa is pink, moist.  Oropharynx is pink without lesions or erythema.  Lymph: No cervical, supraclavicular, or infraclavicular lymphadenopathy noted on palpation.  Cardiovascular: Regular rate and rhythm.Marland Kitchen Respiratory: Clear to auscultation bilaterally. Chest expansion symmetric; breathing non-labored.  Chest: Several nevi in varying colors and sizes. Large dark  nevus to (L) intramammary fold area; pt seeing dermatologist regularly and this is not a new lesion.  Breast Exam:  -Left breast: No appreciable masses on palpation. No skin redness, thickening, or peau d'orange appearance; no nipple retraction or nipple discharge; mild distortion in symmetry at previous lumpectomy site; healed scar without erythema or nodularity.  -Right breast: No appreciable masses on palpation. No skin redness, thickening, or peau d'orange appearance; no nipple discharge; healed scar around areola from previous cyst removal. -Axilla: No axillary adenopathy bilaterally.  GI: Abdomen soft and round; non-tender, non-distended. Bowel sounds hyperactive. No hepatosplenomegaly.   GU: Deferred.  Neuro: No focal deficits. Steady gait.  Psych: Mood and affect normal and appropriate for situation.  Extremities: No edema. Skin: Warm and dry.  LABORATORY DATA:  None for this visit.    DIAGNOSTIC IMAGING:  Most recent mammogram: 01/03/16      ASSESSMENT AND PLAN:  Ms.. Esteve is a pleasant 82 y.o. female with history of Stage IA left breast invasive ductal carcinoma, ER+/PR+/HER2-, diagnosed in 10/2007, treated with lumpectomy. She did not get adjuvant breast radiation therapy d/t her age. She then completed anti-estrogen therapy with Femara x 5 years (10/2007-10/2012). She presents to the Survivorship Clinic for surveillance and routine follow-up.   1. History of Stage IA left breast cancer:  Ms. Teodoro is  currently clinically and radiographically without evidence of disease or recurrence of breast cancer. She will follow-up with [place] in the Survivorship Clinic in 1 year after her annual mammogram.  We discussed that since she is now 7 years out from her cancer diagnosis, she will resume screening mammograms from this point forward.  I think the 3-D mammogram would still be a great choice for her.  We will see her in 12/2016; I encouraged her to call me if she has any questions/concerns before her next appointment at the cancer center and I would be happy to see her sooner, if needed.   2. Bone health:  Given Ms. Ruffolo's age and history of breast cancer, she is at risk for bone demineralization.  She also completed 5 years of aromatase inhibitor therapy. Her last DEXA scan was on 06/06/14 and showed osteopenia.  Since she has completed her anti-estrogen therapy, I will defer any future DEXA imaging to her PCP, as clinically indicated.  I encouraged her to continue her calcium/Vitamin D supplementation.  In the meantime, she was encouraged to increase her consumption of foods rich in calcium, as well as increase her weight-bearing activities.  She was given education on specific food and activities to promote bone health.    Dispo:  -Return to cancer center to see Survivorship NP in 12/2016 after annual mammogram; orders placed today.  -Encouraged her to continue to follow-up with her dermatologist for multiple nevi/moles.   A total of 30 minutes of face-to-face time was spent with this patient with greater than 50% of that time in counseling and care-coordination.   Mike Craze, NP Survivorship Program [facility] [phone]   Note: PRIMARY CARE PROVIDER [name], [place] [phone]

## 2016-01-16 ENCOUNTER — Encounter: Payer: Self-pay | Admitting: Adult Health

## 2016-01-16 ENCOUNTER — Telehealth: Payer: Self-pay | Admitting: Adult Health

## 2016-01-16 ENCOUNTER — Ambulatory Visit (HOSPITAL_BASED_OUTPATIENT_CLINIC_OR_DEPARTMENT_OTHER): Payer: Medicare Other | Admitting: Adult Health

## 2016-01-16 ENCOUNTER — Other Ambulatory Visit: Payer: Self-pay | Admitting: Adult Health

## 2016-01-16 VITALS — BP 136/62 | HR 90 | Temp 98.6°F | Resp 18 | Ht 62.5 in | Wt 120.2 lb

## 2016-01-16 DIAGNOSIS — Z853 Personal history of malignant neoplasm of breast: Secondary | ICD-10-CM | POA: Diagnosis not present

## 2016-01-16 DIAGNOSIS — Z1231 Encounter for screening mammogram for malignant neoplasm of breast: Secondary | ICD-10-CM

## 2016-01-16 DIAGNOSIS — C50912 Malignant neoplasm of unspecified site of left female breast: Secondary | ICD-10-CM

## 2016-01-16 DIAGNOSIS — M8580 Other specified disorders of bone density and structure, unspecified site: Secondary | ICD-10-CM

## 2016-01-16 NOTE — Telephone Encounter (Signed)
appt made and avs printed °

## 2016-01-17 ENCOUNTER — Encounter: Payer: Medicare Other | Admitting: Adult Health

## 2016-02-05 ENCOUNTER — Ambulatory Visit (INDEPENDENT_AMBULATORY_CARE_PROVIDER_SITE_OTHER): Payer: Medicare Other

## 2016-02-05 DIAGNOSIS — Z23 Encounter for immunization: Secondary | ICD-10-CM

## 2016-02-12 ENCOUNTER — Other Ambulatory Visit: Payer: Self-pay | Admitting: Family Medicine

## 2016-02-28 ENCOUNTER — Emergency Department (HOSPITAL_COMMUNITY): Payer: Medicare Other

## 2016-02-28 ENCOUNTER — Encounter (HOSPITAL_COMMUNITY): Payer: Self-pay

## 2016-02-28 ENCOUNTER — Observation Stay (HOSPITAL_COMMUNITY)
Admission: EM | Admit: 2016-02-28 | Discharge: 2016-03-02 | Disposition: A | Discharge status: Home / Self Care | Payer: Medicare Other | Attending: Family Medicine | Admitting: Family Medicine

## 2016-02-28 DIAGNOSIS — M6281 Muscle weakness (generalized): Secondary | ICD-10-CM

## 2016-02-28 DIAGNOSIS — C50912 Malignant neoplasm of unspecified site of left female breast: Secondary | ICD-10-CM | POA: Diagnosis present

## 2016-02-28 DIAGNOSIS — E7800 Pure hypercholesterolemia, unspecified: Secondary | ICD-10-CM | POA: Insufficient documentation

## 2016-02-28 DIAGNOSIS — Z853 Personal history of malignant neoplasm of breast: Secondary | ICD-10-CM | POA: Diagnosis not present

## 2016-02-28 DIAGNOSIS — I083 Combined rheumatic disorders of mitral, aortic and tricuspid valves: Secondary | ICD-10-CM | POA: Diagnosis not present

## 2016-02-28 DIAGNOSIS — Z886 Allergy status to analgesic agent status: Secondary | ICD-10-CM | POA: Diagnosis not present

## 2016-02-28 DIAGNOSIS — R29818 Other symptoms and signs involving the nervous system: Secondary | ICD-10-CM | POA: Diagnosis not present

## 2016-02-28 DIAGNOSIS — E11649 Type 2 diabetes mellitus with hypoglycemia without coma: Secondary | ICD-10-CM | POA: Diagnosis not present

## 2016-02-28 DIAGNOSIS — M109 Gout, unspecified: Secondary | ICD-10-CM | POA: Diagnosis not present

## 2016-02-28 DIAGNOSIS — Z85820 Personal history of malignant melanoma of skin: Secondary | ICD-10-CM | POA: Diagnosis not present

## 2016-02-28 DIAGNOSIS — M1990 Unspecified osteoarthritis, unspecified site: Secondary | ICD-10-CM | POA: Diagnosis not present

## 2016-02-28 DIAGNOSIS — I700 Atherosclerosis of aorta: Secondary | ICD-10-CM | POA: Diagnosis not present

## 2016-02-28 DIAGNOSIS — Z7902 Long term (current) use of antithrombotics/antiplatelets: Secondary | ICD-10-CM | POA: Insufficient documentation

## 2016-02-28 DIAGNOSIS — Z803 Family history of malignant neoplasm of breast: Secondary | ICD-10-CM | POA: Insufficient documentation

## 2016-02-28 DIAGNOSIS — Z7984 Long term (current) use of oral hypoglycemic drugs: Secondary | ICD-10-CM | POA: Insufficient documentation

## 2016-02-28 DIAGNOSIS — I708 Atherosclerosis of other arteries: Secondary | ICD-10-CM | POA: Insufficient documentation

## 2016-02-28 DIAGNOSIS — I6612 Occlusion and stenosis of left anterior cerebral artery: Secondary | ICD-10-CM | POA: Diagnosis not present

## 2016-02-28 DIAGNOSIS — M8580 Other specified disorders of bone density and structure, unspecified site: Secondary | ICD-10-CM | POA: Diagnosis not present

## 2016-02-28 DIAGNOSIS — K219 Gastro-esophageal reflux disease without esophagitis: Secondary | ICD-10-CM | POA: Diagnosis not present

## 2016-02-28 DIAGNOSIS — I872 Venous insufficiency (chronic) (peripheral): Secondary | ICD-10-CM | POA: Diagnosis not present

## 2016-02-28 DIAGNOSIS — E119 Type 2 diabetes mellitus without complications: Secondary | ICD-10-CM

## 2016-02-28 DIAGNOSIS — I10 Essential (primary) hypertension: Secondary | ICD-10-CM | POA: Insufficient documentation

## 2016-02-28 DIAGNOSIS — Z823 Family history of stroke: Secondary | ICD-10-CM | POA: Insufficient documentation

## 2016-02-28 DIAGNOSIS — I639 Cerebral infarction, unspecified: Secondary | ICD-10-CM | POA: Diagnosis not present

## 2016-02-28 DIAGNOSIS — Z8249 Family history of ischemic heart disease and other diseases of the circulatory system: Secondary | ICD-10-CM | POA: Insufficient documentation

## 2016-02-28 DIAGNOSIS — I6789 Other cerebrovascular disease: Secondary | ICD-10-CM | POA: Diagnosis not present

## 2016-02-28 DIAGNOSIS — G43909 Migraine, unspecified, not intractable, without status migrainosus: Secondary | ICD-10-CM | POA: Insufficient documentation

## 2016-02-28 DIAGNOSIS — Z882 Allergy status to sulfonamides status: Secondary | ICD-10-CM | POA: Diagnosis not present

## 2016-02-28 DIAGNOSIS — K5909 Other constipation: Secondary | ICD-10-CM | POA: Insufficient documentation

## 2016-02-28 DIAGNOSIS — I6603 Occlusion and stenosis of bilateral middle cerebral arteries: Secondary | ICD-10-CM | POA: Diagnosis not present

## 2016-02-28 DIAGNOSIS — Z8673 Personal history of transient ischemic attack (TIA), and cerebral infarction without residual deficits: Secondary | ICD-10-CM | POA: Diagnosis not present

## 2016-02-28 DIAGNOSIS — G459 Transient cerebral ischemic attack, unspecified: Secondary | ICD-10-CM | POA: Diagnosis not present

## 2016-02-28 DIAGNOSIS — E1142 Type 2 diabetes mellitus with diabetic polyneuropathy: Secondary | ICD-10-CM

## 2016-02-28 DIAGNOSIS — R51 Headache: Secondary | ICD-10-CM | POA: Diagnosis not present

## 2016-02-28 DIAGNOSIS — Z8042 Family history of malignant neoplasm of prostate: Secondary | ICD-10-CM | POA: Insufficient documentation

## 2016-02-28 DIAGNOSIS — F329 Major depressive disorder, single episode, unspecified: Secondary | ICD-10-CM | POA: Insufficient documentation

## 2016-02-28 DIAGNOSIS — E785 Hyperlipidemia, unspecified: Secondary | ICD-10-CM | POA: Diagnosis present

## 2016-02-28 DIAGNOSIS — E1169 Type 2 diabetes mellitus with other specified complication: Secondary | ICD-10-CM

## 2016-02-28 DIAGNOSIS — G43809 Other migraine, not intractable, without status migrainosus: Secondary | ICD-10-CM | POA: Diagnosis present

## 2016-02-28 DIAGNOSIS — R4781 Slurred speech: Secondary | ICD-10-CM | POA: Diagnosis not present

## 2016-02-28 LAB — GLUCOSE, CAPILLARY
GLUCOSE-CAPILLARY: 158 mg/dL — AB (ref 65–99)
Glucose-Capillary: 119 mg/dL — ABNORMAL HIGH (ref 65–99)

## 2016-02-28 LAB — CBC
HEMATOCRIT: 41.6 % (ref 36.0–46.0)
Hemoglobin: 13.4 g/dL (ref 12.0–15.0)
MCH: 30.5 pg (ref 26.0–34.0)
MCHC: 32.2 g/dL (ref 30.0–36.0)
MCV: 94.5 fL (ref 78.0–100.0)
Platelets: 196 10*3/uL (ref 150–400)
RBC: 4.4 MIL/uL (ref 3.87–5.11)
RDW: 14 % (ref 11.5–15.5)
WBC: 10 10*3/uL (ref 4.0–10.5)

## 2016-02-28 LAB — I-STAT CHEM 8, ED
BUN: 18 mg/dL (ref 6–20)
CALCIUM ION: 1.18 mmol/L (ref 1.15–1.40)
Chloride: 101 mmol/L (ref 101–111)
Creatinine, Ser: 0.8 mg/dL (ref 0.44–1.00)
GLUCOSE: 134 mg/dL — AB (ref 65–99)
HCT: 43 % (ref 36.0–46.0)
HEMOGLOBIN: 14.6 g/dL (ref 12.0–15.0)
Potassium: 4.1 mmol/L (ref 3.5–5.1)
Sodium: 138 mmol/L (ref 135–145)
TCO2: 25 mmol/L (ref 0–100)

## 2016-02-28 LAB — COMPREHENSIVE METABOLIC PANEL
ALT: 12 U/L — AB (ref 14–54)
AST: 18 U/L (ref 15–41)
Albumin: 3.8 g/dL (ref 3.5–5.0)
Alkaline Phosphatase: 62 U/L (ref 38–126)
Anion gap: 9 (ref 5–15)
BILIRUBIN TOTAL: 0.7 mg/dL (ref 0.3–1.2)
BUN: 15 mg/dL (ref 6–20)
CHLORIDE: 102 mmol/L (ref 101–111)
CO2: 25 mmol/L (ref 22–32)
CREATININE: 0.82 mg/dL (ref 0.44–1.00)
Calcium: 10 mg/dL (ref 8.9–10.3)
Glucose, Bld: 137 mg/dL — ABNORMAL HIGH (ref 65–99)
Potassium: 4.1 mmol/L (ref 3.5–5.1)
Sodium: 136 mmol/L (ref 135–145)
TOTAL PROTEIN: 6.8 g/dL (ref 6.5–8.1)

## 2016-02-28 LAB — URINALYSIS, ROUTINE W REFLEX MICROSCOPIC
Bilirubin Urine: NEGATIVE
Glucose, UA: NEGATIVE mg/dL
HGB URINE DIPSTICK: NEGATIVE
Ketones, ur: NEGATIVE mg/dL
LEUKOCYTES UA: NEGATIVE
NITRITE: NEGATIVE
PROTEIN: NEGATIVE mg/dL
SPECIFIC GRAVITY, URINE: 1.01 (ref 1.005–1.030)
pH: 6.5 (ref 5.0–8.0)

## 2016-02-28 LAB — DIFFERENTIAL
BASOS PCT: 1 %
Basophils Absolute: 0.1 10*3/uL (ref 0.0–0.1)
EOS ABS: 0.2 10*3/uL (ref 0.0–0.7)
Eosinophils Relative: 2 %
Lymphocytes Relative: 17 %
Lymphs Abs: 1.7 10*3/uL (ref 0.7–4.0)
MONO ABS: 0.7 10*3/uL (ref 0.1–1.0)
MONOS PCT: 7 %
NEUTROS ABS: 7.4 10*3/uL (ref 1.7–7.7)
Neutrophils Relative %: 73 %

## 2016-02-28 LAB — ETHANOL

## 2016-02-28 LAB — PROTIME-INR
INR: 1.06
Prothrombin Time: 13.8 seconds (ref 11.4–15.2)

## 2016-02-28 LAB — RAPID URINE DRUG SCREEN, HOSP PERFORMED
Amphetamines: NOT DETECTED
BENZODIAZEPINES: NOT DETECTED
Barbiturates: NOT DETECTED
Cocaine: NOT DETECTED
Opiates: NOT DETECTED
Tetrahydrocannabinol: NOT DETECTED

## 2016-02-28 LAB — I-STAT TROPONIN, ED: TROPONIN I, POC: 0 ng/mL (ref 0.00–0.08)

## 2016-02-28 LAB — APTT: aPTT: 34 seconds (ref 24–36)

## 2016-02-28 LAB — CBG MONITORING, ED: GLUCOSE-CAPILLARY: 112 mg/dL — AB (ref 65–99)

## 2016-02-28 MED ORDER — ENOXAPARIN SODIUM 40 MG/0.4ML ~~LOC~~ SOLN
40.0000 mg | SUBCUTANEOUS | Status: DC
  Administered 2016-02-28 – 2016-03-02 (×4): 40 mg via SUBCUTANEOUS
  Filled 2016-02-28 (×4): qty 0.4

## 2016-02-28 MED ORDER — SIMVASTATIN 20 MG PO TABS
20.0000 mg | ORAL_TABLET | Freq: Every day | ORAL | Status: DC
  Administered 2016-02-28 – 2016-03-01 (×3): 20 mg via ORAL
  Filled 2016-02-28 (×3): qty 1

## 2016-02-28 MED ORDER — ACETAMINOPHEN 650 MG RE SUPP: 650.0000 mg | Freq: Four times a day (QID) | RECTAL | Status: DC | PRN

## 2016-02-28 MED ORDER — PANTOPRAZOLE SODIUM 40 MG PO TBEC: 40.0000 mg | DELAYED_RELEASE_TABLET | Freq: Every day | ORAL | Status: DC

## 2016-02-28 MED ORDER — CITALOPRAM HYDROBROMIDE 10 MG PO TABS
5.0000 mg | ORAL_TABLET | Freq: Every day | ORAL | Status: DC
  Administered 2016-02-28 – 2016-03-02 (×4): 5 mg via ORAL
  Filled 2016-02-28 (×4): qty 1

## 2016-02-28 MED ORDER — TRAZODONE HCL 50 MG PO TABS
25.0000 mg | ORAL_TABLET | Freq: Every evening | ORAL | Status: DC | PRN
  Administered 2016-02-28: 25 mg via ORAL
  Filled 2016-02-28: qty 1

## 2016-02-28 MED ORDER — HYDROCODONE-ACETAMINOPHEN 5-325 MG PO TABS: 1.0000 | ORAL_TABLET | ORAL | Status: DC | PRN

## 2016-02-28 MED ORDER — INSULIN ASPART 100 UNIT/ML ~~LOC~~ SOLN
0.0000 [IU] | Freq: Three times a day (TID) | SUBCUTANEOUS | Status: DC
  Administered 2016-02-29: 2 [IU] via SUBCUTANEOUS
  Administered 2016-03-01: 1 [IU] via SUBCUTANEOUS
  Administered 2016-03-01 – 2016-03-02 (×2): 2 [IU] via SUBCUTANEOUS

## 2016-02-28 MED ORDER — SIMETHICONE 80 MG PO CHEW
80.0000 mg | CHEWABLE_TABLET | Freq: Four times a day (QID) | ORAL | Status: DC | PRN
  Administered 2016-02-28: 80 mg via ORAL
  Filled 2016-02-28: qty 1

## 2016-02-28 MED ORDER — IOPAMIDOL (ISOVUE-370) INJECTION 76%
50.0000 mL | Freq: Once | INTRAVENOUS | Status: AC | PRN
  Administered 2016-02-28: 50 mL via INTRAVENOUS

## 2016-02-28 MED ORDER — ONDANSETRON HCL 4 MG/2ML IJ SOLN: 4.0000 mg | Freq: Four times a day (QID) | INTRAMUSCULAR | Status: DC | PRN

## 2016-02-28 MED ORDER — PANTOPRAZOLE SODIUM 40 MG PO TBEC
40.0000 mg | DELAYED_RELEASE_TABLET | Freq: Every day | ORAL | Status: DC
  Administered 2016-02-29 – 2016-03-02 (×3): 40 mg via ORAL
  Filled 2016-02-28 (×3): qty 1

## 2016-02-28 MED ORDER — ONDANSETRON HCL 4 MG PO TABS: 4.0000 mg | ORAL_TABLET | Freq: Four times a day (QID) | ORAL | Status: DC | PRN

## 2016-02-28 MED ORDER — ASPIRIN EC 325 MG PO TBEC
325.0000 mg | DELAYED_RELEASE_TABLET | Freq: Once | ORAL | Status: AC
  Administered 2016-02-28: 325 mg via ORAL
  Filled 2016-02-28: qty 1

## 2016-02-28 MED ORDER — MAGNESIUM CITRATE PO SOLN: 1.0000 | Freq: Once | ORAL | Status: DC | PRN

## 2016-02-28 MED ORDER — ACETAMINOPHEN 325 MG PO TABS: 650.0000 mg | ORAL_TABLET | Freq: Four times a day (QID) | ORAL | Status: DC | PRN

## 2016-02-28 MED ORDER — CLOPIDOGREL BISULFATE 75 MG PO TABS: 75.0000 mg | ORAL_TABLET | Freq: Once | ORAL | Status: DC

## 2016-02-28 MED ORDER — SENNOSIDES-DOCUSATE SODIUM 8.6-50 MG PO TABS
1.0000 | ORAL_TABLET | Freq: Every evening | ORAL | Status: DC | PRN
  Administered 2016-02-28: 1 via ORAL
  Filled 2016-02-28: qty 1

## 2016-02-28 MED ORDER — BISACODYL 10 MG RE SUPP: 10.0000 mg | Freq: Every day | RECTAL | Status: DC | PRN

## 2016-02-28 MED ORDER — CLOPIDOGREL BISULFATE 75 MG PO TABS
75.0000 mg | ORAL_TABLET | Freq: Every day | ORAL | Status: DC
  Administered 2016-02-29 – 2016-03-01 (×2): 75 mg via ORAL
  Filled 2016-02-28 (×2): qty 1

## 2016-02-28 MED ORDER — SODIUM CHLORIDE 0.9 % IV SOLN
Freq: Once | INTRAVENOUS | Status: AC
  Administered 2016-02-28: 12:00:00 via INTRAVENOUS

## 2016-02-28 MED ORDER — STROKE: EARLY STAGES OF RECOVERY BOOK
Freq: Once | Status: AC
  Administered 2016-02-28: 14:00:00
  Filled 2016-02-28: qty 1

## 2016-02-28 NOTE — Progress Notes (Signed)
Speech Pathology: Orders received for swallow evaluation, however pt passed RN stroke swallow screen and diet orders were written; therefore no SLP swallow eval warranted per protocol. Our services will sign off.  Illana Nolting L. Tivis Ringer, [place] CCC/SLP Pager [phone]

## 2016-02-28 NOTE — ED Triage Notes (Signed)
Pt was at an appt with dermatologist when she suddenly had an episode where she was staring off in the distance and unable to speak pt arrived and symptoms appears to have resolved

## 2016-02-28 NOTE — ED Provider Notes (Signed)
[facility] DEPT Provider Note   CSN: [id number] Arrival date & time: 02/28/16  1102   An emergency department physician performed an initial assessment on this suspected stroke patient at 85.  History   Chief Complaint Chief Complaint  Patient presents with  . Altered Mental Status    at 10:30 this am pt was noted by dermatologist to ewxhibit s/s of a stroke as she was unable to speak and stared off in the distance pt arrives awake and alert and responsive    HPI LEWANDA BIGA is a 82 y.o. female.  HPI  82 y.o. female with a hx of Breast Cancer, DM, HLD, TIA, presents to the Emergency Department today as Code Stroke. Per EMS, pt was sitting in the Dermatologist office when she began to stare blankly into space unable to respond to questioning. This occurred around 10:30AM. Resolved after some time. Pt reports mild headache currently. No N/V. No fevers. No CP/SOB/ABD pain. Has hx of TIA in past. Pt currently on Plavix. Pt baseline currently. No other symptoms noted.   Past Medical History:  Diagnosis Date  . Breast cancer ([place])    left breast  . Chronic constipation   . Chronic headaches   . Diabetes mellitus   . Fibrocystic breast   . Gastritis   . Gout    injection in the past  . Hyperlipidemia   . Melanoma in situ of left upper extremity ([place])   . Pelvic fracture ([place])   . TIA (transient ischemic attack) 08/2008   neg MRI/MRA and CT and carotid dopplers  . Venous insufficiency     Patient Active Problem List   Diagnosis Date Noted  . GERD (gastroesophageal reflux disease) 08/06/2015  . Hearing loss 08/06/2015  . Estrogen deficiency 05/01/2014  . Left knee pain 09/22/2013  . Chest pain, musculoskeletal 08/05/2013  . Pneumonia involving right lung 07/12/2013  . Encounter for Medicare annual wellness exam 04/04/2013  . Colon cancer screening 04/04/2013  . Hypoglycemia 10/27/2012  . History of melanoma excision 09/27/2012  . History of breast cancer, left  01/08/2011  . History of pelvic fracture 09/17/2010  . DEGENERATIVE JOINT DISEASE 02/13/2010  . MIGRAINE VARIANT 11/06/2009  . Osteopenia 10/18/2008  . Breast cancer, left breast ([place]) 09/10/2008  . TIA 09/10/2008  . Diabetes type 2, controlled ([place]) 10/06/2007  . VENOUS INSUFFICIENCY 10/06/2007  . DIVERTICULOSIS, COLON 10/06/2007  . FIBROCYSTIC BREAST DISEASE 10/06/2007  . HYPERCHOLESTEROLEMIA, PURE 11/29/2006    Past Surgical History:  Procedure Laterality Date  . APPENDECTOMY    . BREAST CYST ASPIRATION    . BREAST LUMPECTOMY  09/2007   with sentinel LN biopsy  . BREAST LUMPECTOMY     left  . EYE SURGERY     retinal, then cataract    OB History    No data available       Home Medications    Prior to Admission medications   Medication Sig Start Date End Date Taking? Authorizing Provider  Calcium-Vitamin D 600-200 MG-UNIT per tablet Take 1 tablet by mouth 2 (two) times daily. Reported on 08/02/2015    Historical Provider, MD  citalopram (CELEXA) 10 MG tablet Take 0.5 tablets (5 mg total) by mouth daily. 08/06/15   Abner Greenspan, MD  clopidogrel (PLAVIX) 75 MG tablet TAKE ONE TABLET BY MOUTH EACH MORNING WITH BREAKFAST 08/02/15   Abner Greenspan, MD  fish oil-omega-3 fatty acids 1000 MG capsule Take 1 g by mouth daily.  Historical Provider, MD  glucose blood (ONE TOUCH ULTRA TEST) test strip USE AS DIRECTED TO CHECK BLOOD SUGAR ONCE A DAY AND AS NEEDED (DX. E11.9) 03/14/15   Abner Greenspan, MD  metFORMIN (GLUCOPHAGE) 500 MG tablet Take 0.5 tablets (250 mg total) by mouth 2 (two) times daily with a meal. 11/27/15   Abner Greenspan, MD  Multiple Vitamins-Minerals (PRESERVISION AREDS 2 PO) Take 1 tablet by mouth 2 (two) times daily.    Historical Provider, MD  NEXIUM 40 MG capsule Take 1 capsule (40 mg total) by mouth daily. Name brand please 11/12/15   Abner Greenspan, MD  polyethylene glycol powder (GLYCOLAX/MIRALAX) powder DISSOLVE ONE (1) CAPFUL (17GM) INTO 4 TO8 OUNCES OF FLUID  AND TAKE BYMOUTH ONCE DAILY AS DIRECTED 08/02/15   Abner Greenspan, MD  ranitidine (ZANTAC) 300 MG tablet Take 1 tablet (300 mg total) by mouth daily as needed for heartburn. 11/12/15   Abner Greenspan, MD  simethicone (MYLICON) 80 MG chewable tablet Chew 80 mg by mouth every 6 (six) hours as needed for flatulence.    Historical Provider, MD  simvastatin (ZOCOR) 20 MG tablet TAKE ONE TABLET BY MOUTH EVERY NIGHT AT BEDTIME 02/12/16   Abner Greenspan, MD    Family History Family History  Problem Relation Age of Onset  . Breast cancer Sister   . Stroke Sister   . Cancer Sister     breast  . Breast cancer Sister   . Stroke Sister   . Cancer Sister     breast  . Stroke Mother   . Stroke Father   . Stroke Brother   . Cancer Brother     prostate  . Stroke Brother   . Stroke Sister   . CAD Sister     Social History Social History  Substance Use Topics  . Smoking status: Never Smoker  . Smokeless tobacco: Never Used  . Alcohol use No     Allergies   Sulfonamide derivatives and Aspirin   Review of Systems Review of Systems ROS reviewed and all are negative for acute change except as noted in the HPI.  Physical Exam Updated Vital Signs BP 148/76 (BP Location: Right Arm)   Pulse 71   Temp 98.5 F (36.9 C) (Oral)   Resp 20   Ht 5\' 5"  (1.651 m)   Wt 55.6 kg   SpO2 99%   BMI 20.40 kg/m   Physical Exam  Constitutional: She is oriented to person, place, and time. Vital signs are normal. She appears well-developed and well-nourished.  HENT:  Head: Normocephalic.  Right Ear: Hearing normal.  Left Ear: Hearing normal.  Eyes: Conjunctivae and EOM are normal. Pupils are equal, round, and reactive to light.  Neck: Normal range of motion. Neck supple.  Cardiovascular: Normal rate, regular rhythm, normal heart sounds and intact distal pulses.   Pulmonary/Chest: Effort normal and breath sounds normal.  Musculoskeletal: Normal range of motion.  Neurological: She is alert and oriented  to person, place, and time. She has normal strength. No cranial nerve deficit or sensory deficit.  Negative Pronator Drift Cranial Nerves:  II: Pupils equal, round, reactive to light III,IV, VI: ptosis not present, extra-ocular motions intact bilaterally  V,VII: smile symmetric, facial light touch sensation equal VIII: hearing grossly normal bilaterally  IX,X: midline uvula rise  XI: bilateral shoulder shrug equal and strong XII: midline tongue extension  Skin: Skin is warm and dry.  Psychiatric: She has a normal mood and affect.  Her speech is normal and behavior is normal. Thought content normal.  Nursing note and vitals reviewed.  ED Treatments / Results  Labs (all labs ordered are listed, but only abnormal results are displayed) Labs Reviewed  I-STAT CHEM 8, ED - Abnormal; Notable for the following:       Result Value   Glucose, Bld 134 (*)    All other components within normal limits  CBG MONITORING, ED - Abnormal; Notable for the following:    Glucose-Capillary 112 (*)    All other components within normal limits  PROTIME-INR  APTT  CBC  DIFFERENTIAL  ETHANOL  COMPREHENSIVE METABOLIC PANEL  URINE RAPID DRUG SCREEN, HOSP PERFORMED  URINALYSIS, ROUTINE W REFLEX MICROSCOPIC (NOT AT [facility])  I-STAT TROPOININ, ED    EKG  EKG Interpretation None       Radiology Ct Angio Head W Or Wo Contrast  Result Date: 02/28/2016 CLINICAL DATA:  82 year old female code stroke.   Initial encounter. EXAM: CT ANGIOGRAPHY HEAD AND NECK TECHNIQUE: Multidetector CT imaging of the head and neck was performed using the standard protocol during bolus administration of intravenous contrast. Multiplanar CT image reconstructions and MIPs were obtained to evaluate the vascular anatomy. Carotid stenosis measurements (when applicable) are obtained utilizing NASCET criteria, using the distal internal carotid diameter as the denominator. CONTRAST:  50 mL Isovue 370 COMPARISON:  Noncontrast head CT 1115  hours today, and earlier including intracranial MRA 10/27/2012. FINDINGS: CTA NECK Skeleton: No acute osseous abnormality identified. Visualized paranasal sinuses and mastoids are stable and well pneumatized. Upper chest: Mild mosaic attenuation in the lungs. No superior mediastinal lymphadenopathy. Visualized major airways are patent. Other neck: Negative thyroid, larynx, pharynx, parapharyngeal spaces, retropharyngeal space, sublingual space, submandibular glands and parotid glands. No cervical lymphadenopathy. Aortic arch: 3 vessel arch configuration. Moderate Calcified aortic atherosclerosis. No great vessel origin stenosis. Right carotid system: Negative right CCA. Minimal atherosclerosis at the right carotid bifurcation. Negative cervical right ICA aside from tortuosity. Left carotid system: Negative left CCA. Negative left carotid bifurcation. Negative cervical left ICA aside from tortuosity. Vertebral arteries:No proximal right subclavian artery stenosis. Minimal calcified plaque at the right vertebral artery origin without stenosis. Mildly tortuous right V1 segment. Mildly dominant right vertebral artery is normal to the skullbase aside from tortuosity. Minimal calcified plaque at the left subclavian origin with no stenosis. Normal left vertebral artery origin. Tortuous left V1 segment. Slightly non dominant left vertebral is negative to the skullbase. CTA HEAD Posterior circulation: No distal vertebral artery stenosis, the right is dominant. Both PICA origins are normal. Normal vertebrobasilar junction. Mild basilar artery tortuosity. No basilar stenosis. Patent AICA origins. Patent SCA and PCA origins. Mild to moderate irregularity and stenosis at the right PCA origin which is new since 2014 (series 11, image 24). Normal left PCA P1 segment. Normal left PCA branches. On the right there is moderate to severe tandem stenosis in the right P2 segment (series 10, image 21 and also series 12, image 18) which is  new/progressed since 2014. Diminutive bilateral posterior communicating arteries. Anterior circulation: Both ICA siphons are patent. Mild to moderate calcified plaque in both siphons with no stenosis. This contra dex the appearance of the left anterior genu on the 2014 MRA where stenosis was suspected. Patent carotid termini. Normal MCA and ACA origins. Dominant right and diminutive left ACA A1 segments. Normal anterior communicating artery. There is mild irregularity and stenosis in the proximal left A2 which may be new. There is severe stenosis in the distal  left ACA at the pericallosal level (series 12, image 21) which was not included on the prior MRA. Left MCA M1 segment and bifurcation are patent without stenosis. No left proximal M2 occlusion or lesion identified. Mild left M2 irregularity. There is a severe stenosis in the distal posterior left M2 or posterior M3 (series 12, image 29. This might have been present in 2014. Right MCA M1 segment and bifurcation are patent. No proximal right M2 occlusion or lesion. Mild right M2 irregularity. There is a severe stenosis in a posterior distal M2 or in 3 (series 12, image 13) which may be new since 2014. Venous sinuses: Grossly patent. Anatomic variants: Mildly dominant right vertebral artery. Dominant right ACA A1 segment. Review of the MIP images confirms the above findings IMPRESSION: 1. Negative for emergent large vessel occlusion. 2. Minimal atherosclerosis in the neck without stenosis. No anterior circulation large vessel or first order vessel stenosis. 3. Moderate to severe stenosis in the left ACA pericallosal artery and bilateral MCA distal posterior M2 or M3 branches. 4. Mild to moderate stenosis at the right PCA origin is new since 2014. Moderate to severe right P2 stenoses are new or progressed since 2014. 5. Mild to moderate aortic arch calcified atherosclerosis. 6. Preliminary report of the above discussed by telephone with Dr. Lurena Joiner on  02/28/2016 at 1127 hours. Electronically Signed   By: Genevie Ann M.D.   On: 02/28/2016 12:01   Ct Angio Neck W Or Wo Contrast  Result Date: 02/28/2016 CLINICAL DATA:  82 year old female code stroke.   Initial encounter. EXAM: CT ANGIOGRAPHY HEAD AND NECK TECHNIQUE: Multidetector CT imaging of the head and neck was performed using the standard protocol during bolus administration of intravenous contrast. Multiplanar CT image reconstructions and MIPs were obtained to evaluate the vascular anatomy. Carotid stenosis measurements (when applicable) are obtained utilizing NASCET criteria, using the distal internal carotid diameter as the denominator. CONTRAST:  50 mL Isovue 370 COMPARISON:  Noncontrast head CT 1115 hours today, and earlier including intracranial MRA 10/27/2012. FINDINGS: CTA NECK Skeleton: No acute osseous abnormality identified. Visualized paranasal sinuses and mastoids are stable and well pneumatized. Upper chest: Mild mosaic attenuation in the lungs. No superior mediastinal lymphadenopathy. Visualized major airways are patent. Other neck: Negative thyroid, larynx, pharynx, parapharyngeal spaces, retropharyngeal space, sublingual space, submandibular glands and parotid glands. No cervical lymphadenopathy. Aortic arch: 3 vessel arch configuration. Moderate Calcified aortic atherosclerosis. No great vessel origin stenosis. Right carotid system: Negative right CCA. Minimal atherosclerosis at the right carotid bifurcation. Negative cervical right ICA aside from tortuosity. Left carotid system: Negative left CCA. Negative left carotid bifurcation. Negative cervical left ICA aside from tortuosity. Vertebral arteries:No proximal right subclavian artery stenosis. Minimal calcified plaque at the right vertebral artery origin without stenosis. Mildly tortuous right V1 segment. Mildly dominant right vertebral artery is normal to the skullbase aside from tortuosity. Minimal calcified plaque at the left subclavian  origin with no stenosis. Normal left vertebral artery origin. Tortuous left V1 segment. Slightly non dominant left vertebral is negative to the skullbase. CTA HEAD Posterior circulation: No distal vertebral artery stenosis, the right is dominant. Both PICA origins are normal. Normal vertebrobasilar junction. Mild basilar artery tortuosity. No basilar stenosis. Patent AICA origins. Patent SCA and PCA origins. Mild to moderate irregularity and stenosis at the right PCA origin which is new since 2014 (series 11, image 24). Normal left PCA P1 segment. Normal left PCA branches. On the right there is moderate to severe tandem stenosis in the  right P2 segment (series 10, image 21 and also series 12, image 18) which is new/progressed since 2014. Diminutive bilateral posterior communicating arteries. Anterior circulation: Both ICA siphons are patent. Mild to moderate calcified plaque in both siphons with no stenosis. This contra dex the appearance of the left anterior genu on the 2014 MRA where stenosis was suspected. Patent carotid termini. Normal MCA and ACA origins. Dominant right and diminutive left ACA A1 segments. Normal anterior communicating artery. There is mild irregularity and stenosis in the proximal left A2 which may be new. There is severe stenosis in the distal left ACA at the pericallosal level (series 12, image 21) which was not included on the prior MRA. Left MCA M1 segment and bifurcation are patent without stenosis. No left proximal M2 occlusion or lesion identified. Mild left M2 irregularity. There is a severe stenosis in the distal posterior left M2 or posterior M3 (series 12, image 29. This might have been present in 2014. Right MCA M1 segment and bifurcation are patent. No proximal right M2 occlusion or lesion. Mild right M2 irregularity. There is a severe stenosis in a posterior distal M2 or in 3 (series 12, image 13) which may be new since 2014. Venous sinuses: Grossly patent. Anatomic variants:  Mildly dominant right vertebral artery. Dominant right ACA A1 segment. Review of the MIP images confirms the above findings IMPRESSION: 1. Negative for emergent large vessel occlusion. 2. Minimal atherosclerosis in the neck without stenosis. No anterior circulation large vessel or first order vessel stenosis. 3. Moderate to severe stenosis in the left ACA pericallosal artery and bilateral MCA distal posterior M2 or M3 branches. 4. Mild to moderate stenosis at the right PCA origin is new since 2014. Moderate to severe right P2 stenoses are new or progressed since 2014. 5. Mild to moderate aortic arch calcified atherosclerosis. 6. Preliminary report of the above discussed by telephone with Dr. Lurena Joiner on 02/28/2016 at 1127 hours. Electronically Signed   By: Genevie Ann M.D.   On: 02/28/2016 12:01   Ct Head Code Stroke W/o Cm  Result Date: 02/28/2016 CLINICAL DATA:  Code stroke. 82 year old female. Frontal headache. Last seen normal 1030 hours. Initial encounter. EXAM: CT HEAD WITHOUT CONTRAST TECHNIQUE: Contiguous axial images were obtained from the base of the skull through the vertex without intravenous contrast. COMPARISON:  Head CT 12/28/2012.  Brain MRI 10/27/2012. FINDINGS: Brain: No midline shift, mass effect, or evidence of intracranial mass lesion. No acute intracranial hemorrhage identified. Patchy and confluent bilateral cerebral white matter hypodensity. Involvement of the deep gray matter nuclei, especially the thalami. More focal chronic white matter encephalomalacia in the right centrum semiovale appears stable since 2014 (series 225). No acute cortically based infarct identified. Vascular: Calcified atherosclerosis at the skull base.Increased conspicuity of the left MCA bifurcation or proximal M2 branch on series 2, image 14 appears not significantly changed since 2014. No suspicious intracranial vascular hyperdensity. Skull: No acute osseous abnormality identified. Sinuses/Orbits: Improved right  sphenoid aeration with mild residual mucosal thickening. Other visible paranasal sinuses and mastoids remain clear. Other: No acute orbit or scalp soft tissue findings. ASPECTS [facility] Stroke Program Early CT Score) Total score (0-10 with 10 being normal): 10. IMPRESSION: 1. Stable CT appearance of chronic small vessel disease. No acute intracranial hemorrhage or acute cortically based infarct identified. 2. ASPECTS is 10. 3. The above was relayed by text page to Dr. Elpidio Anis at 11:24 am on 02/28/2016. Electronically Signed   By: Genevie Ann M.D.   On:  02/28/2016 11:27    Procedures Procedures (including critical care time)  Medications Ordered in ED Medications  0.9 %  sodium chloride infusion (not administered)  aspirin EC tablet 325 mg (not administered)  iopamidol (ISOVUE-370) 76 % injection 50 mL (50 mLs Intravenous Contrast Given 02/28/16 1124)     Initial Impression / Assessment and Plan / ED Course  I have reviewed the triage vital signs and the nursing notes.  Pertinent labs & imaging results that were available during my care of the patient were reviewed by me and considered in my medical decision making (see chart for details).  Clinical Course    Final Clinical Impressions(s) / ED Diagnoses  I have reviewed and evaluated the relevant laboratory valuesI have reviewed and evaluated the relevant imaging studies. I have interpreted the relevant EKG.I have reviewed the relevant previous healthcare records.I have reviewed EMS Documentation.I obtained HPI from historian. Patient discussed with supervising physician  ED Course:  Assessment: Pt is a 43yF with hx TIA who presents as Code Stroke. Unresponsive at Dermatologist office around 1030AM. Symptoms resolved.. On exam, pt in NAD. Nontoxic/nonseptic appearing. VSS. Afebrile. Lungs CTA. Heart RRR. Abdomen nontender soft. CN evaluated and unremarkable. Labs unremarkable. CT Head unremarkable. Neuro seen in ED. Suspect TIA. Plan is to Admit  to medicine.   Disposition/Plan:  Admit Pt acknowledges and agrees with plan  Supervising Physician Leo Grosser, MD   Final diagnoses:  Stroke (cerebrum) [facility])  Stroke (cerebrum) [facility])    New Prescriptions New Prescriptions   No medications on file     Shary Decamp, PA-C 02/28/16 1212    Leo Grosser, MD 02/29/16 1043

## 2016-02-28 NOTE — Consult Note (Signed)
NEURO HOSPITALIST CONSULT NOTE   Requestig physician: Dr.Knott  Reason for Consult:Code stroke    HPI:                                                                                                                                          Lauren [name] is an 82 y.o. female with history of diabetes, hyperlipidemia, TIA came in with code stroke. Paramedics reported around 10:30 AM she had acute onset of word finding difficulties. Upon arrival to the emergency room patient had mild word finding difficulty which improved in the next 10 minutes. She did not had any focal weakness. CT scan of the head is negative for any acute abnormalities, it shows bilateral signs of small vessel disease. CTA head and neck did not show significant large vessel occlusion, it did show signs of cerebral vascular disease mostly in distally. She takes Plavix and statin therapy at home. Reports stomach upset with aspirin. Currently denies any chest pain palpitation fever chills night sweats no change in bowel or urinary habits.  Past Medical History:  Diagnosis Date  . Breast cancer ([place])    left breast  . Chronic constipation   . Chronic headaches   . Diabetes mellitus   . Fibrocystic breast   . Gastritis   . Gout    injection in the past  . Hyperlipidemia   . Melanoma in situ of left upper extremity ([place])   . Pelvic fracture ([place])   . TIA (transient ischemic attack) 08/2008   neg MRI/MRA and CT and carotid dopplers  . Venous insufficiency     Past Surgical History:  Procedure Laterality Date  . APPENDECTOMY    . BREAST CYST ASPIRATION    . BREAST LUMPECTOMY  09/2007   with sentinel LN biopsy  . BREAST LUMPECTOMY     left  . EYE SURGERY     retinal, then cataract    Family History  Problem Relation Age of Onset  . Breast cancer Sister   . Stroke Sister   . Cancer Sister     breast  . Breast cancer Sister   . Stroke Sister   . Cancer Sister     breast  .  Stroke Mother   . Stroke Father   . Stroke Brother   . Cancer Brother     prostate  . Stroke Brother   . Stroke Sister   . CAD Sister      Social History:  reports that she has never smoked. She has never used smokeless tobacco. She reports that she does not drink alcohol or use drugs.  Allergies  Allergen Reactions  . Sulfonamide Derivatives Itching  . Aspirin Other (See Comments)    Burning and pain in stomach   Home Medications  Prior to Admission medications   Medication Sig Start Date End Date Taking? Authorizing Provider  Calcium-Vitamin D 600-200 MG-UNIT per tablet Take 1 tablet by mouth 2 (two) times daily. Reported on 08/02/2015    Historical Provider, MD  citalopram (CELEXA) 10 MG tablet Take 0.5 tablets (5 mg total) by mouth daily. 08/06/15   Abner Greenspan, MD  clopidogrel (PLAVIX) 75 MG tablet TAKE ONE TABLET BY MOUTH EACH MORNING WITH BREAKFAST 08/02/15   Abner Greenspan, MD  fish oil-omega-3 fatty acids 1000 MG capsule Take 1 g by mouth daily.      Historical Provider, MD  glucose blood (ONE TOUCH ULTRA TEST) test strip USE AS DIRECTED TO CHECK BLOOD SUGAR ONCE A DAY AND AS NEEDED (DX. E11.9) 03/14/15   Abner Greenspan, MD  metFORMIN (GLUCOPHAGE) 500 MG tablet Take 0.5 tablets (250 mg total) by mouth 2 (two) times daily with a meal. 11/27/15   Abner Greenspan, MD  Multiple Vitamins-Minerals (PRESERVISION AREDS 2 PO) Take 1 tablet by mouth 2 (two) times daily.    Historical Provider, MD  NEXIUM 40 MG capsule Take 1 capsule (40 mg total) by mouth daily. Name brand please 11/12/15   Abner Greenspan, MD  polyethylene glycol powder (GLYCOLAX/MIRALAX) powder DISSOLVE ONE (1) CAPFUL (17GM) INTO 4 TO8 OUNCES OF FLUID AND TAKE BYMOUTH ONCE DAILY AS DIRECTED 08/02/15   Abner Greenspan, MD  ranitidine (ZANTAC) 300 MG tablet Take 1 tablet (300 mg total) by mouth daily as needed for heartburn. 11/12/15   Abner Greenspan, MD  simethicone (MYLICON) 80 MG  chewable tablet Chew 80 mg by mouth every 6 (six) hours as needed for flatulence.    Historical Provider, MD  simvastatin (ZOCOR) 20 MG tablet TAKE ONE TABLET BY MOUTH EVERY NIGHT AT BEDTIME          ROS:        As mentioned in history of present illness                                                                                                                             Blood pressure 148/76, pulse 71, temperature 98.5 F (36.9 C), temperature source Oral, resp. rate 20, height 5\' 5"  (1.651 m), weight 55.6 kg (122 lb 9.2 oz), SpO2 99 %.   Neurologic Examination:                                                                                                     Gen. lying in bed and appears comfortable  Neck supple without any lymphadenopathy Lungs clear to auscultation CVS S1-S2 regular Psychiatric normal inside Neurologic Alert and oriented 3 Cranial nerves II through XII intact Motor exam did not reveal any focal weakness, Finger to nose test is normal Speech initially word finding difficulty which resolved in the next 10 minutes. Gait not assessed     Lab Results: Basic Metabolic Panel:  Recent Labs Lab 02/28/16 1113  NA 138  K 4.1  CL 101  GLUCOSE 134*  BUN 18  CREATININE 0.80    Liver Function Tests: No results for input(s): AST, ALT, ALKPHOS, BILITOT, PROT, ALBUMIN in the last 168 hours. No results for input(s): LIPASE, AMYLASE in the last 168 hours. No results for input(s): AMMONIA in the last 168 hours.  CBC:  Recent Labs Lab 02/28/16 1105 02/28/16 1113  WBC 10.0  --   NEUTROABS 7.4  --   HGB 13.4 14.6  HCT 41.6 43.0  MCV 94.5  --   PLT 196  --     Cardiac Enzymes: No results for input(s): CKTOTAL, CKMB, CKMBINDEX, TROPONINI in the last 168 hours.  Lipid Panel: No results for input(s): CHOL, TRIG, HDL, CHOLHDL, VLDL, LDLCALC in the last 168 hours.  CBG:  Recent Labs Lab 02/28/16 1125  GLUCAP 112*    Microbiology: Results for  orders placed or performed in visit on 05/03/14  Fecal occult blood, imunochemical     Status: None   Collection Time: 05/03/14  4:27 PM  Result Value Ref Range Status   Fecal Occult Bld Negative Negative Final    Coagulation Studies: No results for input(s): LABPROT, INR in the last 72 hours.  Imaging: Ct Head Code Stroke W/o Cm  Result Date: 02/28/2016 CLINICAL DATA:  Code stroke. 82 year old female. Frontal headache. Last seen normal 1030 hours. Initial encounter. EXAM: CT HEAD WITHOUT CONTRAST TECHNIQUE: Contiguous axial images were obtained from the base of the skull through the vertex without intravenous contrast. COMPARISON:  Head CT 12/28/2012.  Brain MRI 10/27/2012. FINDINGS: Brain: No midline shift, mass effect, or evidence of intracranial mass lesion. No acute intracranial hemorrhage identified. Patchy and confluent bilateral cerebral white matter hypodensity. Involvement of the deep gray matter nuclei, especially the thalami. More focal chronic white matter encephalomalacia in the right centrum semiovale appears stable since 2014 (series 225). No acute cortically based infarct identified. Vascular: Calcified atherosclerosis at the skull base.Increased conspicuity of the left MCA bifurcation or proximal M2 branch on series 2, image 14 appears not significantly changed since 2014. No suspicious intracranial vascular hyperdensity. Skull: No acute osseous abnormality identified. Sinuses/Orbits: Improved right sphenoid aeration with mild residual mucosal thickening. Other visible paranasal sinuses and mastoids remain clear. Other: No acute orbit or scalp soft tissue findings. ASPECTS [facility] Stroke Program Early CT Score) Total score (0-10 with 10 being normal): 10. IMPRESSION: 1. Stable CT appearance of chronic small vessel disease. No acute intracranial hemorrhage or acute cortically based infarct identified. 2. ASPECTS is 10. 3. The above was relayed by text page to Dr. Elpidio Anis at 11:24 am on  02/28/2016. Electronically Signed   By: Genevie Ann M.D.   On: 02/28/2016 11:27       02/28/2016, 11:33 AM   Assessment/Plan:  this is a 82 year old female with history of TIA, diabetes, hyperlipidemia presented with acute onset of word finding difficulty which improved during initial evaluation. Clinical history is suggestive of transient ischemic attack. CT scan of the head did not show any acute abnormalities except signs of small vessel  disease. CTA head and neck did not show significant large vessel occlusion but does show signs of atherosclerotic disease. Recommend patient to be admitted for complete stroke versus TIA workup. MRI brain Echocardiogram Lipid panel, hemoglobin A1c. Continue Plavix and statin therapy. Diabetes should be appropriately addressed.

## 2016-02-28 NOTE — Progress Notes (Signed)
New Admission Note:  Arrival Method:Stretcher  Mental Orientation: Alert x 4 Telemetry: 49m16/ Sinus rhythm  Assessment: Completed Skin: intact IV: left fore arm; infusing. Pain: 0/1-10 Tubes: n/a Safety Measures: Safety Fall Prevention Plan was given, discussed. Admission: Completed 89M orientation: Patient has been orientated to the room, unit and the staff. Family: updated.   Orders have been reviewed and implemented. Will continue to monitor the patient. Call light has been placed within reach and bed alarm has been activated.   Arta Silence ,RN

## 2016-02-28 NOTE — ED Notes (Signed)
Pt arrived to hospital with mild facial droop noted Code Stroke was activated prior to arrival last known well time was 10:30 this morning pt currently awake and alert with an NIH at "0" pt passed her swallow screen and was fed down here in the ED pt has no current complaints

## 2016-02-28 NOTE — ED Notes (Signed)
PA at bedside.

## 2016-02-28 NOTE — ED Notes (Signed)
Hospitalist at bedside pt appears comfortable no change in condition

## 2016-02-28 NOTE — Code Documentation (Signed)
Patient arrives to [facility] ED [facility]. Pt was at her dermatologists office where she was in her normal state of health. At 1030 the dermatologist noted the patient starred off to one side and begun having speech fluency deficits. EMS was called and subsequently a Code Stroke was activated. Upon arrival patient was cleared and accompanied to CT by the Stroke Team where a CT and CTA was performed. Upon assessment the patient's NIH was a 1, noted to have a slight droop in her left side. A decrease in her fluency of speech was noted and the patient confirmed that she was still feeling like she was having difficulties with fluency in her speech. tPA was not givien due to symptoms being too mild to treat. The patient will remain in the tPA window until 1500. Handoff with ED RN Hassan Rowan.

## 2016-02-28 NOTE — H&P (Signed)
History and Physical    Lauren [name] [id number] DOB: 1928-05-08 DOA: 02/28/2016   PCP: [name], MD   Patient coming from:  Home    Chief Complaint: Dysarthria, confusion   HPI: Lauren [name] is a 82 y.o. female with medical history significant for  Breast Cancer, DM, HLD, h/o TIAs brought to the ED from dermatologist office around 10:30 , when she had difficulty finding words, unable to respond questions, resolving after a few minutes. Symptoms are similar for those of 2010. No confusion or seizures. No LOC. No falls. Denies vertigo dizziness or vision changes. Denies headaches, or dysphagia.  Denies any chest pain, or shortness of breath. Denies any fever or chills, or night sweats. No tobacco. No new meds or hormonal supplements. Does not  take a regular ASA a day (GI upset) , but does take Plavix . Denies any recent long distance trips or recent surgeries. No sick contacts. No new stressors present in personal life Patient is compliant with his medications. She is diabetic. Patient was not administered TPAas symptoms resolved  Will admit for further evaluation and treatment.  ED Course:  BP 170/68   Pulse 70   Temp 98.5 F (36.9 C)   Resp (!) 27   Ht 5\' 5"  (1.651 m)   Wt 55.6 kg (122 lb 9.2 oz)   SpO2 96%   BMI 20.40 kg/m    CT head negative for acute intracranial abnormalities CTA  Neck without significant occlusion  Admitted for further workup   Review of Systems: As per HPI otherwise 10 point review of systems negative. LAst MGM in 12/2015 neg. Sees Survivorship clinic yearly, last Aug 2017   Past Medical History:  Diagnosis Date  . Breast cancer ([place])    left breast  . Chronic constipation   . Chronic headaches   . Diabetes mellitus   . Fibrocystic breast   . Gastritis   . Gout    injection in the past  . Hyperlipidemia   . Melanoma in situ of left upper extremity ([place])   . Pelvic fracture ([place])   . TIA (transient ischemic attack) 08/2008   neg MRI/MRA  and CT and carotid dopplers  . Venous insufficiency     Past Surgical History:  Procedure Laterality Date  . APPENDECTOMY    . BREAST CYST ASPIRATION    . BREAST LUMPECTOMY  09/2007   with sentinel LN biopsy  . BREAST LUMPECTOMY     left  . EYE SURGERY     retinal, then cataract    Social History Social History   Social History  . Marital status: Single    Spouse name: N/A  . Number of children: N/A  . Years of education: N/A   Occupational History  . Not on file.   Social History Main Topics  . Smoking status: Never Smoker  . Smokeless tobacco: Never Used  . Alcohol use No  . Drug use: No  . Sexual activity: Not Currently   Other Topics Concern  . Not on file   Social History Narrative   Lives with and cares for both sisters   Strong faith     Allergies  Allergen Reactions  . Sulfonamide Derivatives Itching  . Aspirin Other (See Comments)    Burning and pain in stomach    Family History  Problem Relation Age of Onset  . Breast cancer Sister   . Stroke Sister   . Cancer Sister     breast  .  Breast cancer Sister   . Stroke Sister   . Cancer Sister     breast  . Stroke Mother   . Stroke Father   . Stroke Brother   . Cancer Brother     prostate  . Stroke Brother   . Stroke Sister   . CAD Sister       Prior to Admission medications   Medication Sig Start Date End Date Taking? Authorizing Provider  Calcium-Vitamin D 600-200 MG-UNIT per tablet Take 1 tablet by mouth 2 (two) times daily. Reported on 08/02/2015    Historical Provider, MD  citalopram (CELEXA) 10 MG tablet Take 0.5 tablets (5 mg total) by mouth daily. 08/06/15   Abner Greenspan, MD  clopidogrel (PLAVIX) 75 MG tablet TAKE ONE TABLET BY MOUTH EACH MORNING WITH BREAKFAST 08/02/15   Abner Greenspan, MD  fish oil-omega-3 fatty acids 1000 MG capsule Take 1 g by mouth daily.      Historical Provider, MD  glucose blood (ONE TOUCH ULTRA TEST) test strip USE AS DIRECTED TO CHECK BLOOD SUGAR ONCE A  DAY AND AS NEEDED (DX. E11.9) 03/14/15   Abner Greenspan, MD  metFORMIN (GLUCOPHAGE) 500 MG tablet Take 0.5 tablets (250 mg total) by mouth 2 (two) times daily with a meal. 11/27/15   Abner Greenspan, MD  Multiple Vitamins-Minerals (PRESERVISION AREDS 2 PO) Take 1 tablet by mouth 2 (two) times daily.    Historical Provider, MD  NEXIUM 40 MG capsule Take 1 capsule (40 mg total) by mouth daily. Name brand please 11/12/15   Abner Greenspan, MD  polyethylene glycol powder (GLYCOLAX/MIRALAX) powder DISSOLVE ONE (1) CAPFUL (17GM) INTO 4 TO8 OUNCES OF FLUID AND TAKE BYMOUTH ONCE DAILY AS DIRECTED 08/02/15   Abner Greenspan, MD  ranitidine (ZANTAC) 300 MG tablet Take 1 tablet (300 mg total) by mouth daily as needed for heartburn. 11/12/15   Abner Greenspan, MD  simethicone (MYLICON) 80 MG chewable tablet Chew 80 mg by mouth every 6 (six) hours as needed for flatulence.    Historical Provider, MD  simvastatin (ZOCOR) 20 MG tablet TAKE ONE TABLET BY MOUTH EVERY NIGHT AT BEDTIME 02/12/16   Abner Greenspan, MD    Physical Exam:    Vitals:   02/28/16 1131 02/28/16 1159 02/28/16 1200 02/28/16 1230  BP: 148/76  186/79 170/68  Pulse: 71  68 70  Resp: 20  23 (!) 27  Temp: 98.5 F (36.9 C) 98.5 F (36.9 C)    TempSrc: Oral     SpO2: 99%  98% 96%  Weight: 55.6 kg (122 lb 9.2 oz)     Height: 5\' 5"  (1.651 m)          Constitutional: NAD, calm, comfortable Vitals:   02/28/16 1131 02/28/16 1159 02/28/16 1200 02/28/16 1230  BP: 148/76  186/79 170/68  Pulse: 71  68 70  Resp: 20  23 (!) 27  Temp: 98.5 F (36.9 C) 98.5 F (36.9 C)    TempSrc: Oral     SpO2: 99%  98% 96%  Weight: 55.6 kg (122 lb 9.2 oz)     Height: 5\' 5"  (1.651 m)      Eyes: PERRL, lids and conjunctivae normal ENMT: Mucous membranes are moist. Posterior pharynx clear of any exudate or lesions.Normal dentition.  Neck: normal, supple, no masses, no thyromegaly Respiratory: clear to auscultation bilaterally, no wheezing, no crackles. Normal  respiratory effort. No accessory muscle use.  Cardiovascular: Regular rate and rhythm, no murmurs /  rubs / gallops. No extremity edema. 2+ pedal pulses. No carotid bruits.  Abdomen: no tenderness, no masses palpated. No hepatosplenomegaly. Bowel sounds positive.  Musculoskeletal: no clubbing / cyanosis. No joint deformity upper and lower extremities. Good ROM, no contractures. Normal muscle tone.  Skin: no rashes, lesions, ulcers.  Neurologic: CN 2-12 grossly intact. Sensation intact, DTR normal. Strength 5/5 in all 4.  Psychiatric: Normal judgment and insight. Alert and oriented x 3. Normal mood.     Labs on Admission: I have personally reviewed following labs and imaging studies  CBC:  Recent Labs Lab 02/28/16 1105 02/28/16 1113  WBC 10.0  --   NEUTROABS 7.4  --   HGB 13.4 14.6  HCT 41.6 43.0  MCV 94.5  --   PLT 196  --     Basic Metabolic Panel:  Recent Labs Lab 02/28/16 1105 02/28/16 1113  NA 136 138  K 4.1 4.1  CL 102 101  CO2 25  --   GLUCOSE 137* 134*  BUN 15 18  CREATININE 0.82 0.80  CALCIUM 10.0  --     GFR: Estimated Creatinine Clearance: 44.3 mL/min (by C-G formula based on SCr of 0.8 mg/dL).  Liver Function Tests:  Recent Labs Lab 02/28/16 1105  AST 18  ALT 12*  ALKPHOS 62  BILITOT 0.7  PROT 6.8  ALBUMIN 3.8   No results for input(s): LIPASE, AMYLASE in the last 168 hours. No results for input(s): AMMONIA in the last 168 hours.  Coagulation Profile:  Recent Labs Lab 02/28/16 1105  INR 1.06    Cardiac Enzymes: No results for input(s): CKTOTAL, CKMB, CKMBINDEX, TROPONINI in the last 168 hours.  BNP (last 3 results) No results for input(s): PROBNP in the last 8760 hours.  HbA1C: No results for input(s): HGBA1C in the last 72 hours.  CBG:  Recent Labs Lab 02/28/16 1125  GLUCAP 112*    Lipid Profile: No results for input(s): CHOL, HDL, LDLCALC, TRIG, CHOLHDL, LDLDIRECT in the last 72 hours.  Thyroid Function Tests: No  results for input(s): TSH, T4TOTAL, FREET4, T3FREE, THYROIDAB in the last 72 hours.  Anemia Panel: No results for input(s): VITAMINB12, FOLATE, FERRITIN, TIBC, IRON, RETICCTPCT in the last 72 hours.  Urine analysis:    Component Value Date/Time   COLORURINE YELLOW 01/12/2013 0132   APPEARANCEUR CLEAR 01/12/2013 0132   LABSPEC 1.015 01/12/2013 0132   PHURINE 6.0 01/12/2013 0132   GLUCOSEU NEGATIVE 01/12/2013 0132   HGBUR NEGATIVE 01/12/2013 0132   BILIRUBINUR NEGATIVE 01/12/2013 0132   KETONESUR NEGATIVE 01/12/2013 0132   PROTEINUR NEGATIVE 01/12/2013 0132   UROBILINOGEN 0.2 01/12/2013 0132   NITRITE NEGATIVE 01/12/2013 0132   LEUKOCYTESUR TRACE (A) 01/12/2013 0132    Sepsis Labs: @LABRCNTIP (procalcitonin:4,lacticidven:4) )No results found for this or any previous visit (from the past 240 hour(s)).   Radiological Exams on Admission: Ct Angio Head W Or Wo Contrast  Result Date: 02/28/2016 CLINICAL DATA:  82 year old female code stroke.   Initial encounter. EXAM: CT ANGIOGRAPHY HEAD AND NECK TECHNIQUE: Multidetector CT imaging of the head and neck was performed using the standard protocol during bolus administration of intravenous contrast. Multiplanar CT image reconstructions and MIPs were obtained to evaluate the vascular anatomy. Carotid stenosis measurements (when applicable) are obtained utilizing NASCET criteria, using the distal internal carotid diameter as the denominator. CONTRAST:  50 mL Isovue 370 COMPARISON:  Noncontrast head CT 1115 hours today, and earlier including intracranial MRA 10/27/2012. FINDINGS: CTA NECK Skeleton: No acute osseous abnormality identified. Visualized paranasal  sinuses and mastoids are stable and well pneumatized. Upper chest: Mild mosaic attenuation in the lungs. No superior mediastinal lymphadenopathy. Visualized major airways are patent. Other neck: Negative thyroid, larynx, pharynx, parapharyngeal spaces, retropharyngeal space, sublingual space,  submandibular glands and parotid glands. No cervical lymphadenopathy. Aortic arch: 3 vessel arch configuration. Moderate Calcified aortic atherosclerosis. No great vessel origin stenosis. Right carotid system: Negative right CCA. Minimal atherosclerosis at the right carotid bifurcation. Negative cervical right ICA aside from tortuosity. Left carotid system: Negative left CCA. Negative left carotid bifurcation. Negative cervical left ICA aside from tortuosity. Vertebral arteries:No proximal right subclavian artery stenosis. Minimal calcified plaque at the right vertebral artery origin without stenosis. Mildly tortuous right V1 segment. Mildly dominant right vertebral artery is normal to the skullbase aside from tortuosity. Minimal calcified plaque at the left subclavian origin with no stenosis. Normal left vertebral artery origin. Tortuous left V1 segment. Slightly non dominant left vertebral is negative to the skullbase. CTA HEAD Posterior circulation: No distal vertebral artery stenosis, the right is dominant. Both PICA origins are normal. Normal vertebrobasilar junction. Mild basilar artery tortuosity. No basilar stenosis. Patent AICA origins. Patent SCA and PCA origins. Mild to moderate irregularity and stenosis at the right PCA origin which is new since 2014 (series 11, image 24). Normal left PCA P1 segment. Normal left PCA branches. On the right there is moderate to severe tandem stenosis in the right P2 segment (series 10, image 21 and also series 12, image 18) which is new/progressed since 2014. Diminutive bilateral posterior communicating arteries. Anterior circulation: Both ICA siphons are patent. Mild to moderate calcified plaque in both siphons with no stenosis. This contra dex the appearance of the left anterior genu on the 2014 MRA where stenosis was suspected. Patent carotid termini. Normal MCA and ACA origins. Dominant right and diminutive left ACA A1 segments. Normal anterior communicating artery.  There is mild irregularity and stenosis in the proximal left A2 which may be new. There is severe stenosis in the distal left ACA at the pericallosal level (series 12, image 21) which was not included on the prior MRA. Left MCA M1 segment and bifurcation are patent without stenosis. No left proximal M2 occlusion or lesion identified. Mild left M2 irregularity. There is a severe stenosis in the distal posterior left M2 or posterior M3 (series 12, image 29. This might have been present in 2014. Right MCA M1 segment and bifurcation are patent. No proximal right M2 occlusion or lesion. Mild right M2 irregularity. There is a severe stenosis in a posterior distal M2 or in 3 (series 12, image 13) which may be new since 2014. Venous sinuses: Grossly patent. Anatomic variants: Mildly dominant right vertebral artery. Dominant right ACA A1 segment. Review of the MIP images confirms the above findings IMPRESSION: 1. Negative for emergent large vessel occlusion. 2. Minimal atherosclerosis in the neck without stenosis. No anterior circulation large vessel or first order vessel stenosis. 3. Moderate to severe stenosis in the left ACA pericallosal artery and bilateral MCA distal posterior M2 or M3 branches. 4. Mild to moderate stenosis at the right PCA origin is new since 2014. Moderate to severe right P2 stenoses are new or progressed since 2014. 5. Mild to moderate aortic arch calcified atherosclerosis. 6. Preliminary report of the above discussed by telephone with Dr. Lurena Joiner on 02/28/2016 at 1127 hours. Electronically Signed   By: Genevie Ann M.D.   On: 02/28/2016 12:01   Ct Angio Neck W Or Wo Contrast  Result Date: 02/28/2016  CLINICAL DATA:  82 year old female code stroke.   Initial encounter. EXAM: CT ANGIOGRAPHY HEAD AND NECK TECHNIQUE: Multidetector CT imaging of the head and neck was performed using the standard protocol during bolus administration of intravenous contrast. Multiplanar CT image reconstructions and MIPs  were obtained to evaluate the vascular anatomy. Carotid stenosis measurements (when applicable) are obtained utilizing NASCET criteria, using the distal internal carotid diameter as the denominator. CONTRAST:  50 mL Isovue 370 COMPARISON:  Noncontrast head CT 1115 hours today, and earlier including intracranial MRA 10/27/2012. FINDINGS: CTA NECK Skeleton: No acute osseous abnormality identified. Visualized paranasal sinuses and mastoids are stable and well pneumatized. Upper chest: Mild mosaic attenuation in the lungs. No superior mediastinal lymphadenopathy. Visualized major airways are patent. Other neck: Negative thyroid, larynx, pharynx, parapharyngeal spaces, retropharyngeal space, sublingual space, submandibular glands and parotid glands. No cervical lymphadenopathy. Aortic arch: 3 vessel arch configuration. Moderate Calcified aortic atherosclerosis. No great vessel origin stenosis. Right carotid system: Negative right CCA. Minimal atherosclerosis at the right carotid bifurcation. Negative cervical right ICA aside from tortuosity. Left carotid system: Negative left CCA. Negative left carotid bifurcation. Negative cervical left ICA aside from tortuosity. Vertebral arteries:No proximal right subclavian artery stenosis. Minimal calcified plaque at the right vertebral artery origin without stenosis. Mildly tortuous right V1 segment. Mildly dominant right vertebral artery is normal to the skullbase aside from tortuosity. Minimal calcified plaque at the left subclavian origin with no stenosis. Normal left vertebral artery origin. Tortuous left V1 segment. Slightly non dominant left vertebral is negative to the skullbase. CTA HEAD Posterior circulation: No distal vertebral artery stenosis, the right is dominant. Both PICA origins are normal. Normal vertebrobasilar junction. Mild basilar artery tortuosity. No basilar stenosis. Patent AICA origins. Patent SCA and PCA origins. Mild to moderate irregularity and stenosis  at the right PCA origin which is new since 2014 (series 11, image 24). Normal left PCA P1 segment. Normal left PCA branches. On the right there is moderate to severe tandem stenosis in the right P2 segment (series 10, image 21 and also series 12, image 18) which is new/progressed since 2014. Diminutive bilateral posterior communicating arteries. Anterior circulation: Both ICA siphons are patent. Mild to moderate calcified plaque in both siphons with no stenosis. This contra dex the appearance of the left anterior genu on the 2014 MRA where stenosis was suspected. Patent carotid termini. Normal MCA and ACA origins. Dominant right and diminutive left ACA A1 segments. Normal anterior communicating artery. There is mild irregularity and stenosis in the proximal left A2 which may be new. There is severe stenosis in the distal left ACA at the pericallosal level (series 12, image 21) which was not included on the prior MRA. Left MCA M1 segment and bifurcation are patent without stenosis. No left proximal M2 occlusion or lesion identified. Mild left M2 irregularity. There is a severe stenosis in the distal posterior left M2 or posterior M3 (series 12, image 29. This might have been present in 2014. Right MCA M1 segment and bifurcation are patent. No proximal right M2 occlusion or lesion. Mild right M2 irregularity. There is a severe stenosis in a posterior distal M2 or in 3 (series 12, image 13) which may be new since 2014. Venous sinuses: Grossly patent. Anatomic variants: Mildly dominant right vertebral artery. Dominant right ACA A1 segment. Review of the MIP images confirms the above findings IMPRESSION: 1. Negative for emergent large vessel occlusion. 2. Minimal atherosclerosis in the neck without stenosis. No anterior circulation large vessel or first order  vessel stenosis. 3. Moderate to severe stenosis in the left ACA pericallosal artery and bilateral MCA distal posterior M2 or M3 branches. 4. Mild to moderate  stenosis at the right PCA origin is new since 2014. Moderate to severe right P2 stenoses are new or progressed since 2014. 5. Mild to moderate aortic arch calcified atherosclerosis. 6. Preliminary report of the above discussed by telephone with Dr. Lurena Joiner on 02/28/2016 at 1127 hours. Electronically Signed   By: Genevie Ann M.D.   On: 02/28/2016 12:01   Ct Head Code Stroke W/o Cm  Result Date: 02/28/2016 CLINICAL DATA:  Code stroke. 82 year old female. Frontal headache. Last seen normal 1030 hours. Initial encounter. EXAM: CT HEAD WITHOUT CONTRAST TECHNIQUE: Contiguous axial images were obtained from the base of the skull through the vertex without intravenous contrast. COMPARISON:  Head CT 12/28/2012.  Brain MRI 10/27/2012. FINDINGS: Brain: No midline shift, mass effect, or evidence of intracranial mass lesion. No acute intracranial hemorrhage identified. Patchy and confluent bilateral cerebral white matter hypodensity. Involvement of the deep gray matter nuclei, especially the thalami. More focal chronic white matter encephalomalacia in the right centrum semiovale appears stable since 2014 (series 225). No acute cortically based infarct identified. Vascular: Calcified atherosclerosis at the skull base.Increased conspicuity of the left MCA bifurcation or proximal M2 branch on series 2, image 14 appears not significantly changed since 2014. No suspicious intracranial vascular hyperdensity. Skull: No acute osseous abnormality identified. Sinuses/Orbits: Improved right sphenoid aeration with mild residual mucosal thickening. Other visible paranasal sinuses and mastoids remain clear. Other: No acute orbit or scalp soft tissue findings. ASPECTS [facility] Stroke Program Early CT Score) Total score (0-10 with 10 being normal): 10. IMPRESSION: 1. Stable CT appearance of chronic small vessel disease. No acute intracranial hemorrhage or acute cortically based infarct identified. 2. ASPECTS is 10. 3. The above was relayed  by text page to Dr. Elpidio Anis at 11:24 am on 02/28/2016. Electronically Signed   By: Genevie Ann M.D.   On: 02/28/2016 11:27    EKG: Independently reviewed.  Assessment/Plan Active Problems:   Transient ischemic attack   Breast cancer, left breast ([place])   Diabetes type 2, controlled ([place])   HYPERCHOLESTEROLEMIA, PURE   Migraine variant   Strokelike symptoms, slurred speech now resolved, h/o TIA 2010 , on Plavix. No residual at this time. VSS, Alert and able to talk without difficulty  Received ASA x1 here CT head negative for acute intracranial abnormalities.  CTA  Neck without significant occlusion . Admitted for further workup for TIA  Telemetry observation Check MRI/MRA head 2D echo Check lipid panel and A1C Continue PLavix SLP  UDS pending  Tylenol for headaches   Type II Diabetes Current blood sugar level is 119 Lab Results  Component Value Date   HGBA1C 6.7 (H) 11/07/2015  Hgb A1C Hold home oral diabetic medications.  SSI Heart healthy carb modified diet.  Hyperlipidemia Continue home statins Check lipid panel   GERD, no acute symptoms: Continue PPI   Depression Continue Celexa   History of L breast Cancer, on remission Follow as OP at [place]   DVT prophylaxis: Lovenox  After MRI completed and no bleeding noted  Code Status:   Full    Family Communication:  Discussed with patient Disposition Plan: Expect patient to be discharged to home after condition improves Consults called:    Neuro Admission status:Tele  Obs    [facility], PA-C [facility]   If 7PM-7AM, please contact night-coverage [email or web address] Password [email or web address]  02/28/2016, 1:03 PM

## 2016-02-29 ENCOUNTER — Observation Stay (HOSPITAL_COMMUNITY): Payer: Medicare Other

## 2016-02-29 DIAGNOSIS — G459 Transient cerebral ischemic attack, unspecified: Secondary | ICD-10-CM | POA: Diagnosis not present

## 2016-02-29 DIAGNOSIS — C50912 Malignant neoplasm of unspecified site of left female breast: Secondary | ICD-10-CM

## 2016-02-29 DIAGNOSIS — G458 Other transient cerebral ischemic attacks and related syndromes: Secondary | ICD-10-CM | POA: Diagnosis not present

## 2016-02-29 DIAGNOSIS — R4781 Slurred speech: Secondary | ICD-10-CM | POA: Diagnosis not present

## 2016-02-29 LAB — GLUCOSE, CAPILLARY
GLUCOSE-CAPILLARY: 126 mg/dL — AB (ref 65–99)
Glucose-Capillary: 117 mg/dL — ABNORMAL HIGH (ref 65–99)
Glucose-Capillary: 119 mg/dL — ABNORMAL HIGH (ref 65–99)
Glucose-Capillary: 163 mg/dL — ABNORMAL HIGH (ref 65–99)

## 2016-02-29 LAB — CBC
HCT: 40 % (ref 36.0–46.0)
HEMOGLOBIN: 13 g/dL (ref 12.0–15.0)
MCH: 30.8 pg (ref 26.0–34.0)
MCHC: 32.5 g/dL (ref 30.0–36.0)
MCV: 94.8 fL (ref 78.0–100.0)
Platelets: 183 10*3/uL (ref 150–400)
RBC: 4.22 MIL/uL (ref 3.87–5.11)
RDW: 13.8 % (ref 11.5–15.5)
WBC: 9.1 10*3/uL (ref 4.0–10.5)

## 2016-02-29 LAB — COMPREHENSIVE METABOLIC PANEL
ALK PHOS: 56 U/L (ref 38–126)
ALT: 11 U/L — ABNORMAL LOW (ref 14–54)
ANION GAP: 7 (ref 5–15)
AST: 15 U/L (ref 15–41)
Albumin: 3.3 g/dL — ABNORMAL LOW (ref 3.5–5.0)
BILIRUBIN TOTAL: 0.5 mg/dL (ref 0.3–1.2)
BUN: 16 mg/dL (ref 6–20)
CALCIUM: 9.6 mg/dL (ref 8.9–10.3)
CO2: 27 mmol/L (ref 22–32)
Chloride: 103 mmol/L (ref 101–111)
Creatinine, Ser: 0.83 mg/dL (ref 0.44–1.00)
GFR calc non Af Amer: >60 mL/min (ref >60)
Glucose, Bld: 108 mg/dL — ABNORMAL HIGH (ref 65–99)
Potassium: 4 mmol/L (ref 3.5–5.1)
SODIUM: 137 mmol/L (ref 135–145)
TOTAL PROTEIN: 6.4 g/dL — AB (ref 6.5–8.1)

## 2016-02-29 LAB — LIPID PANEL
CHOL/HDL RATIO: 4.4 ratio
CHOLESTEROL: 136 mg/dL (ref 0–200)
HDL: 31 mg/dL — AB (ref >40)
LDL Cholesterol: 55 mg/dL (ref 0–99)
TRIGLYCERIDES: 252 mg/dL — AB (ref <150)
VLDL: 50 mg/dL — AB (ref 0–40)

## 2016-02-29 LAB — HEMOGLOBIN A1C
Hgb A1c MFr Bld: 6.6 % — ABNORMAL HIGH (ref 4.8–5.6)
Mean Plasma Glucose: 143 mg/dL

## 2016-02-29 NOTE — Progress Notes (Signed)
PROGRESS NOTE    Lauren [name]  [id number] DOB: 1928/05/09 DOA: 02/28/2016 PCP: [name], MD    Brief Narrative:   Lauren [name] is a 82 y.o. female with medical history significant for  Breast Cancer, DM, HLD, h/o TIAs brought to the ED from dermatologist office around 10:30 , when she had difficulty finding words, unable to respond questions, resolving after a few minutes. Symptoms are similar for those of 2010. No confusion or seizures. No LOC. No falls. Denies vertigo dizziness or vision changes. Denies headaches, or dysphagia.  Denies any chest pain, or shortness of breath. Denies any fever or chills, or night sweats. No tobacco. No new meds or hormonal supplements. Does not  take a regular ASA a day (GI upset) , but does take Plavix . Denies any recent long distance trips or recent surgeries. No sick contacts. No new stressors present in personal life Patient is compliant with his medications. She is diabetic. Patient was not administered TPAas symptoms resolved Will admit for further evaluation and treatment. In the emergency department patient was seen and evaluated- BP 170/68   Pulse 70   Temp 98.5 F (36.9 C)   Resp (!) 27   Ht 5\' 5"  (1.651 m)   Wt 55.6 kg (122 lb 9.2 oz)   SpO2 96%   BMI 20.40 kg/m , CT head negative for acute intracranial abnormalities, CTA  Neck without significant occlusion, Admitted for further workup.    Assessment & Plan:   Active Problems:   Breast cancer, left breast ([place])   Diabetes type 2, controlled ([place])   HYPERCHOLESTEROLEMIA, PURE   Migraine variant   Transient ischemic attack   TIA (transient ischemic attack)   Strokelike symptoms,  - slurred speech resolved - h/o TIA 2010 , on Plavix. - Received ASA x1 here  - CT head negative for acute intracranial abnormalities.  - CTA  Neck without significant occlusion .  - MRI/MRA head: No acute infarction. Moderate chronic small-vessel ischemic changes of the cerebral hemispheric deep  white matter.No intracranial major vessel occlusion or correctable proximal stenosis. Distal vessel atherosclerotic narrowing and irregularity most notable in multiple M2 segments bilaterally and in the PCAs, right more than left. - 2D echo pending - A1c 6.6 - Continue PLavix - SLP  - UDS negative - Tylenol for headaches  - EEG ordered by Neurology  Type II Diabetes Current blood sugar level is 119 Recent Labs       Lab Results  Component Value Date   HGBA1C 6.7 (H) 11/07/2015    Hgb A1C Hold home oral diabetic medications.  SSI Heart healthy carb modified diet.  Hyperlipidemia Continue home statins Check lipid panel   GERD,  - no acute symptoms: - Continue PPI  Depression Continue Celexa   History of L breast Cancer, on remission Follow as OP at [place]     DVT prophylaxis: Lovenox Code Status: Full Code Family Communication: Discussed with patient and patients daughter Disposition Plan: pending EEG results   Consultants:   Neurology  SLP   Procedures:   Echo pending  Antimicrobials:   none    Subjective: Patient seen and evaluated.  Discussed case with Dr. Leonie Man.  Given patient's history EEG was ordered.  Patient voices that she feels well.  Patient's daughter asked to discuss with MD workup for seizure.  Patient alert and oriented.  Says she has been able to get up to go to the restroom without difficulty and has eaten breakfast.  Objective: Vitals:   02/28/16 2200 02/29/16 0000 02/29/16 0200 02/29/16 1322  BP: (!) 148/64 (!) 140/52 130/63 (!) 142/71  Pulse: 68 72 68 86  Resp: 19 18 17 18   Temp:  97.9 F (36.6 C)  97.9 F (36.6 C)  TempSrc:  Oral  Oral  SpO2: 96% 94% 94% 98%  Weight:      Height:        Intake/Output Summary (Last 24 hours) at 02/29/16 1632 Last data filed at 02/28/16 1700  Gross per 24 hour  Intake                0 ml  Output              300 ml  Net             -300 ml   Filed Weights   02/28/16  1100 02/28/16 1131  Weight: 55.6 kg (122 lb 9.2 oz) 55.6 kg (122 lb 9.2 oz)    Examination:  General exam: Appears calm and comfortable  Respiratory system: Clear to auscultation. Respiratory effort normal. Cardiovascular system: S1 & S2 heard, RRR. No JVD, murmurs, rubs, gallops or clicks. No pedal edema. Gastrointestinal system: Abdomen is nondistended, soft and nontender. No organomegaly or masses felt. Normal bowel sounds heard. Central nervous system: Alert and oriented. No focal neurological deficits. Extremities: Symmetric 5 x 5 power. Skin: No rashes, lesions or ulcers Psychiatry: Judgement and insight appear normal. Mood & affect appropriate.     Data Reviewed: I have personally reviewed following labs and imaging studies  CBC:  Recent Labs Lab 02/28/16 1105 02/28/16 1113 02/29/16 0210  WBC 10.0  --  9.1  NEUTROABS 7.4  --   --   HGB 13.4 14.6 13.0  HCT 41.6 43.0 40.0  MCV 94.5  --  94.8  PLT 196  --  [id number]   Basic Metabolic Panel:  Recent Labs Lab 02/28/16 1105 02/28/16 1113 02/29/16 0210  NA 136 138 137  K 4.1 4.1 4.0  CL 102 101 103  CO2 25  --  27  GLUCOSE 137* 134* 108*  BUN 15 18 16   CREATININE 0.82 0.80 0.83  CALCIUM 10.0  --  9.6   GFR: Estimated Creatinine Clearance: 42.7 mL/min (by C-G formula based on SCr of 0.83 mg/dL). Liver Function Tests:  Recent Labs Lab 02/28/16 1105 02/29/16 0210  AST 18 15  ALT 12* 11*  ALKPHOS 62 56  BILITOT 0.7 0.5  PROT 6.8 6.4*  ALBUMIN 3.8 3.3*   No results for input(s): LIPASE, AMYLASE in the last 168 hours. No results for input(s): AMMONIA in the last 168 hours. Coagulation Profile:  Recent Labs Lab 02/28/16 1105  INR 1.06   Cardiac Enzymes: No results for input(s): CKTOTAL, CKMB, CKMBINDEX, TROPONINI in the last 168 hours. BNP (last 3 results) No results for input(s): PROBNP in the last 8760 hours. HbA1C:  Recent Labs  02/28/16 1325  HGBA1C 6.6*   CBG:  Recent Labs Lab  02/28/16 1630 02/28/16 2139 02/29/16 0634 02/29/16 1127 02/29/16 1608  GLUCAP 119* 158* 117* 119* 163*   Lipid Profile:  Recent Labs  02/29/16 0210  CHOL 136  HDL 31*  LDLCALC 55  TRIG 252*  CHOLHDL 4.4   Thyroid Function Tests: No results for input(s): TSH, T4TOTAL, FREET4, T3FREE, THYROIDAB in the last 72 hours. Anemia Panel: No results for input(s): VITAMINB12, FOLATE, FERRITIN, TIBC, IRON, RETICCTPCT in the last 72 hours. Sepsis Labs: No results for input(s): PROCALCITON,  LATICACIDVEN in the last 168 hours.  No results found for this or any previous visit (from the past 240 hour(s)).       Radiology Studies: Ct Angio Head W Or Wo Contrast  Result Date: 02/28/2016 CLINICAL DATA:  82 year old female code stroke.   Initial encounter. EXAM: CT ANGIOGRAPHY HEAD AND NECK TECHNIQUE: Multidetector CT imaging of the head and neck was performed using the standard protocol during bolus administration of intravenous contrast. Multiplanar CT image reconstructions and MIPs were obtained to evaluate the vascular anatomy. Carotid stenosis measurements (when applicable) are obtained utilizing NASCET criteria, using the distal internal carotid diameter as the denominator. CONTRAST:  50 mL Isovue 370 COMPARISON:  Noncontrast head CT 1115 hours today, and earlier including intracranial MRA 10/27/2012. FINDINGS: CTA NECK Skeleton: No acute osseous abnormality identified. Visualized paranasal sinuses and mastoids are stable and well pneumatized. Upper chest: Mild mosaic attenuation in the lungs. No superior mediastinal lymphadenopathy. Visualized major airways are patent. Other neck: Negative thyroid, larynx, pharynx, parapharyngeal spaces, retropharyngeal space, sublingual space, submandibular glands and parotid glands. No cervical lymphadenopathy. Aortic arch: 3 vessel arch configuration. Moderate Calcified aortic atherosclerosis. No great vessel origin stenosis. Right carotid system: Negative  right CCA. Minimal atherosclerosis at the right carotid bifurcation. Negative cervical right ICA aside from tortuosity. Left carotid system: Negative left CCA. Negative left carotid bifurcation. Negative cervical left ICA aside from tortuosity. Vertebral arteries:No proximal right subclavian artery stenosis. Minimal calcified plaque at the right vertebral artery origin without stenosis. Mildly tortuous right V1 segment. Mildly dominant right vertebral artery is normal to the skullbase aside from tortuosity. Minimal calcified plaque at the left subclavian origin with no stenosis. Normal left vertebral artery origin. Tortuous left V1 segment. Slightly non dominant left vertebral is negative to the skullbase. CTA HEAD Posterior circulation: No distal vertebral artery stenosis, the right is dominant. Both PICA origins are normal. Normal vertebrobasilar junction. Mild basilar artery tortuosity. No basilar stenosis. Patent AICA origins. Patent SCA and PCA origins. Mild to moderate irregularity and stenosis at the right PCA origin which is new since 2014 (series 11, image 24). Normal left PCA P1 segment. Normal left PCA branches. On the right there is moderate to severe tandem stenosis in the right P2 segment (series 10, image 21 and also series 12, image 18) which is new/progressed since 2014. Diminutive bilateral posterior communicating arteries. Anterior circulation: Both ICA siphons are patent. Mild to moderate calcified plaque in both siphons with no stenosis. This contra dex the appearance of the left anterior genu on the 2014 MRA where stenosis was suspected. Patent carotid termini. Normal MCA and ACA origins. Dominant right and diminutive left ACA A1 segments. Normal anterior communicating artery. There is mild irregularity and stenosis in the proximal left A2 which may be new. There is severe stenosis in the distal left ACA at the pericallosal level (series 12, image 21) which was not included on the prior MRA.  Left MCA M1 segment and bifurcation are patent without stenosis. No left proximal M2 occlusion or lesion identified. Mild left M2 irregularity. There is a severe stenosis in the distal posterior left M2 or posterior M3 (series 12, image 29. This might have been present in 2014. Right MCA M1 segment and bifurcation are patent. No proximal right M2 occlusion or lesion. Mild right M2 irregularity. There is a severe stenosis in a posterior distal M2 or in 3 (series 12, image 13) which may be new since 2014. Venous sinuses: Grossly patent. Anatomic variants: Mildly dominant right  vertebral artery. Dominant right ACA A1 segment. Review of the MIP images confirms the above findings IMPRESSION: 1. Negative for emergent large vessel occlusion. 2. Minimal atherosclerosis in the neck without stenosis. No anterior circulation large vessel or first order vessel stenosis. 3. Moderate to severe stenosis in the left ACA pericallosal artery and bilateral MCA distal posterior M2 or M3 branches. 4. Mild to moderate stenosis at the right PCA origin is new since 2014. Moderate to severe right P2 stenoses are new or progressed since 2014. 5. Mild to moderate aortic arch calcified atherosclerosis. 6. Preliminary report of the above discussed by telephone with Dr. Lurena Joiner on 02/28/2016 at 1127 hours. Electronically Signed   By: Genevie Ann M.D.   On: 02/28/2016 12:01   Ct Angio Neck W Or Wo Contrast  Result Date: 02/28/2016 CLINICAL DATA:  82 year old female code stroke.   Initial encounter. EXAM: CT ANGIOGRAPHY HEAD AND NECK TECHNIQUE: Multidetector CT imaging of the head and neck was performed using the standard protocol during bolus administration of intravenous contrast. Multiplanar CT image reconstructions and MIPs were obtained to evaluate the vascular anatomy. Carotid stenosis measurements (when applicable) are obtained utilizing NASCET criteria, using the distal internal carotid diameter as the denominator. CONTRAST:  50 mL  Isovue 370 COMPARISON:  Noncontrast head CT 1115 hours today, and earlier including intracranial MRA 10/27/2012. FINDINGS: CTA NECK Skeleton: No acute osseous abnormality identified. Visualized paranasal sinuses and mastoids are stable and well pneumatized. Upper chest: Mild mosaic attenuation in the lungs. No superior mediastinal lymphadenopathy. Visualized major airways are patent. Other neck: Negative thyroid, larynx, pharynx, parapharyngeal spaces, retropharyngeal space, sublingual space, submandibular glands and parotid glands. No cervical lymphadenopathy. Aortic arch: 3 vessel arch configuration. Moderate Calcified aortic atherosclerosis. No great vessel origin stenosis. Right carotid system: Negative right CCA. Minimal atherosclerosis at the right carotid bifurcation. Negative cervical right ICA aside from tortuosity. Left carotid system: Negative left CCA. Negative left carotid bifurcation. Negative cervical left ICA aside from tortuosity. Vertebral arteries:No proximal right subclavian artery stenosis. Minimal calcified plaque at the right vertebral artery origin without stenosis. Mildly tortuous right V1 segment. Mildly dominant right vertebral artery is normal to the skullbase aside from tortuosity. Minimal calcified plaque at the left subclavian origin with no stenosis. Normal left vertebral artery origin. Tortuous left V1 segment. Slightly non dominant left vertebral is negative to the skullbase. CTA HEAD Posterior circulation: No distal vertebral artery stenosis, the right is dominant. Both PICA origins are normal. Normal vertebrobasilar junction. Mild basilar artery tortuosity. No basilar stenosis. Patent AICA origins. Patent SCA and PCA origins. Mild to moderate irregularity and stenosis at the right PCA origin which is new since 2014 (series 11, image 24). Normal left PCA P1 segment. Normal left PCA branches. On the right there is moderate to severe tandem stenosis in the right P2 segment (series  10, image 21 and also series 12, image 18) which is new/progressed since 2014. Diminutive bilateral posterior communicating arteries. Anterior circulation: Both ICA siphons are patent. Mild to moderate calcified plaque in both siphons with no stenosis. This contra dex the appearance of the left anterior genu on the 2014 MRA where stenosis was suspected. Patent carotid termini. Normal MCA and ACA origins. Dominant right and diminutive left ACA A1 segments. Normal anterior communicating artery. There is mild irregularity and stenosis in the proximal left A2 which may be new. There is severe stenosis in the distal left ACA at the pericallosal level (series 12, image 21) which was not included on  the prior MRA. Left MCA M1 segment and bifurcation are patent without stenosis. No left proximal M2 occlusion or lesion identified. Mild left M2 irregularity. There is a severe stenosis in the distal posterior left M2 or posterior M3 (series 12, image 29. This might have been present in 2014. Right MCA M1 segment and bifurcation are patent. No proximal right M2 occlusion or lesion. Mild right M2 irregularity. There is a severe stenosis in a posterior distal M2 or in 3 (series 12, image 13) which may be new since 2014. Venous sinuses: Grossly patent. Anatomic variants: Mildly dominant right vertebral artery. Dominant right ACA A1 segment. Review of the MIP images confirms the above findings IMPRESSION: 1. Negative for emergent large vessel occlusion. 2. Minimal atherosclerosis in the neck without stenosis. No anterior circulation large vessel or first order vessel stenosis. 3. Moderate to severe stenosis in the left ACA pericallosal artery and bilateral MCA distal posterior M2 or M3 branches. 4. Mild to moderate stenosis at the right PCA origin is new since 2014. Moderate to severe right P2 stenoses are new or progressed since 2014. 5. Mild to moderate aortic arch calcified atherosclerosis. 6. Preliminary report of the above  discussed by telephone with Dr. Lurena Joiner on 02/28/2016 at 1127 hours. Electronically Signed   By: Genevie Ann M.D.   On: 02/28/2016 12:01   Mr Brain Wo Contrast  Result Date: 02/29/2016 CLINICAL DATA:  Slurred speech and expressive aphasia with right facial droop which occurred yesterday. EXAM: MRI HEAD WITHOUT CONTRAST MRA HEAD WITHOUT CONTRAST TECHNIQUE: Multiplanar, multiecho pulse sequences of the brain and surrounding structures were obtained without intravenous contrast. Angiographic images of the head were obtained using MRA technique without contrast. COMPARISON:  CT and CT angiography 02/28/2016.  MRI 10/27/2012. FINDINGS: MRI HEAD FINDINGS Brain: Diffusion imaging does not show any acute or subacute infarction. The brainstem and cerebellum are normal. Cerebral hemispheres show moderate chronic small-vessel ischemic changes affecting the deep and subcortical white matter. No cortical or large vessel territory infarction. No mass lesion, hemorrhage, hydrocephalus or extra-axial collection. Vascular: Major vessels at the base of the brain show flow. Skull and upper cervical spine: Negative Sinuses/Orbits: Clear/normal Other: None significant MRA HEAD FINDINGS Both internal carotid arteries are patent into the brain. There is narrowing and irregularity in the carotid siphon regions but no likely flow limiting stenosis. The anterior and middle cerebral vessels are patent bilaterally with atherosclerotic irregularity of M2 branch vessels bilaterally but no correctable proximal stenosis demonstrated. Both vertebral arteries are widely patent to the basilar. No basilar stenosis. Both posterior inferior cerebellar arteries widely patent. Right anterior inferior cerebellar artery is demonstrated. Both superior cerebellar arteries patent. Both posterior cerebral arteries are patent without evidence of flow limiting proximal stenosis. More distal branch vessels show some atherosclerotic irregularity and narrowing,  worse on the right than the left. IMPRESSION: No acute infarction. Moderate chronic small-vessel ischemic changes of the cerebral hemispheric deep white matter. No intracranial major vessel occlusion or correctable proximal stenosis. Distal vessel atherosclerotic narrowing and irregularity most notable in multiple M2 segments bilaterally and in the PCAs, right more than left. Electronically Signed   By: Nelson Chimes M.D.   On: 02/29/2016 11:41   Mr Jodene Nam Headm  Result Date: 02/29/2016 CLINICAL DATA:  Slurred speech and expressive aphasia with right facial droop which occurred yesterday. EXAM: MRI HEAD WITHOUT CONTRAST MRA HEAD WITHOUT CONTRAST TECHNIQUE: Multiplanar, multiecho pulse sequences of the brain and surrounding structures were obtained without intravenous contrast. Angiographic images of  the head were obtained using MRA technique without contrast. COMPARISON:  CT and CT angiography 02/28/2016.  MRI 10/27/2012. FINDINGS: MRI HEAD FINDINGS Brain: Diffusion imaging does not show any acute or subacute infarction. The brainstem and cerebellum are normal. Cerebral hemispheres show moderate chronic small-vessel ischemic changes affecting the deep and subcortical white matter. No cortical or large vessel territory infarction. No mass lesion, hemorrhage, hydrocephalus or extra-axial collection. Vascular: Major vessels at the base of the brain show flow. Skull and upper cervical spine: Negative Sinuses/Orbits: Clear/normal Other: None significant MRA HEAD FINDINGS Both internal carotid arteries are patent into the brain. There is narrowing and irregularity in the carotid siphon regions but no likely flow limiting stenosis. The anterior and middle cerebral vessels are patent bilaterally with atherosclerotic irregularity of M2 branch vessels bilaterally but no correctable proximal stenosis demonstrated. Both vertebral arteries are widely patent to the basilar. No basilar stenosis. Both posterior inferior cerebellar  arteries widely patent. Right anterior inferior cerebellar artery is demonstrated. Both superior cerebellar arteries patent. Both posterior cerebral arteries are patent without evidence of flow limiting proximal stenosis. More distal branch vessels show some atherosclerotic irregularity and narrowing, worse on the right than the left. IMPRESSION: No acute infarction. Moderate chronic small-vessel ischemic changes of the cerebral hemispheric deep white matter. No intracranial major vessel occlusion or correctable proximal stenosis. Distal vessel atherosclerotic narrowing and irregularity most notable in multiple M2 segments bilaterally and in the PCAs, right more than left. Electronically Signed   By: Nelson Chimes M.D.   On: 02/29/2016 11:41   Ct Head Code Stroke W/o Cm  Result Date: 02/28/2016 CLINICAL DATA:  Code stroke. 82 year old female. Frontal headache. Last seen normal 1030 hours. Initial encounter. EXAM: CT HEAD WITHOUT CONTRAST TECHNIQUE: Contiguous axial images were obtained from the base of the skull through the vertex without intravenous contrast. COMPARISON:  Head CT 12/28/2012.  Brain MRI 10/27/2012. FINDINGS: Brain: No midline shift, mass effect, or evidence of intracranial mass lesion. No acute intracranial hemorrhage identified. Patchy and confluent bilateral cerebral white matter hypodensity. Involvement of the deep gray matter nuclei, especially the thalami. More focal chronic white matter encephalomalacia in the right centrum semiovale appears stable since 2014 (series 225). No acute cortically based infarct identified. Vascular: Calcified atherosclerosis at the skull base.Increased conspicuity of the left MCA bifurcation or proximal M2 branch on series 2, image 14 appears not significantly changed since 2014. No suspicious intracranial vascular hyperdensity. Skull: No acute osseous abnormality identified. Sinuses/Orbits: Improved right sphenoid aeration with mild residual mucosal thickening.  Other visible paranasal sinuses and mastoids remain clear. Other: No acute orbit or scalp soft tissue findings. ASPECTS [facility] Stroke Program Early CT Score) Total score (0-10 with 10 being normal): 10. IMPRESSION: 1. Stable CT appearance of chronic small vessel disease. No acute intracranial hemorrhage or acute cortically based infarct identified. 2. ASPECTS is 10. 3. The above was relayed by text page to Dr. Elpidio Anis at 11:24 am on 02/28/2016. Electronically Signed   By: Genevie Ann M.D.   On: 02/28/2016 11:27        Scheduled Meds: . citalopram  5 mg Oral Daily  . clopidogrel  75 mg Oral Q breakfast  . enoxaparin (LOVENOX) injection  40 mg Subcutaneous Q24H  . insulin aspart  0-9 Units Subcutaneous TID WC  . pantoprazole  40 mg Oral Daily  . simvastatin  20 mg Oral QHS   Continuous Infusions:    LOS: 0 days    Time spent: 35 minutes  Newman Pies, MD [facility] Pager [phone]  If 7PM-7AM, please contact night-coverage [email or web address] Password [email or web address] 02/29/2016, 4:32 PM

## 2016-02-29 NOTE — Progress Notes (Signed)
STROKE TEAM PROGRESS NOTE   HISTORY OF PRESENT ILLNESS (per record) Lauren [name] is an 82 y.o. female with history of diabetes and hyperlipidemia, TIA came in with code stroke. Paramedics reported around 10:30 AM she had acute onset of word finding difficulties. Upon arrival to the emergency room patient had mild word finding difficulty which improved in the next 10 minutes. She did not had any focal weakness. CT scan of the head is negative for any acute abnormalities, it shows bilateral signs of small vessel disease. CTA head and neck did not show significant large vessel occlusion, it did show signs of cerebral vascular disease mostly in distally. She takes Plavix and statin therapy at home. Reports stomach upset with aspirin.   SUBJECTIVE (INTERVAL HISTORY) No family members present. Dr. Leonie Man explained that the patient's MRI was negative for a stroke. Suspect TIA or possible seizure.   OBJECTIVE Temp:  [97.9 F (36.6 C)-98.2 F (36.8 C)] 97.9 F (36.6 C) (10/07 1322) Pulse Rate:  [66-86] 86 (10/07 1322) Cardiac Rhythm: Normal sinus rhythm (10/07 0845) Resp:  [17-19] 18 (10/07 1322) BP: (130-148)/(52-71) 142/71 (10/07 1322) SpO2:  [94 %-98 %] 98 % (10/07 1322)  CBC:   Recent Labs Lab 02/28/16 1105 02/28/16 1113 02/29/16 0210  WBC 10.0  --  9.1  NEUTROABS 7.4  --   --   HGB 13.4 14.6 13.0  HCT 41.6 43.0 40.0  MCV 94.5  --  94.8  PLT 196  --  [id number]    Basic Metabolic Panel:   Recent Labs Lab 02/28/16 1105 02/28/16 1113 02/29/16 0210  NA 136 138 137  K 4.1 4.1 4.0  CL 102 101 103  CO2 25  --  27  GLUCOSE 137* 134* 108*  BUN 15 18 16   CREATININE 0.82 0.80 0.83  CALCIUM 10.0  --  9.6    Lipid Panel:     Component Value Date/Time   CHOL 136 02/29/2016 0210   TRIG 252 (H) 02/29/2016 0210   HDL 31 (L) 02/29/2016 0210   CHOLHDL 4.4 02/29/2016 0210   VLDL 50 (H) 02/29/2016 0210   LDLCALC 55 02/29/2016 0210   HgbA1c:  Lab Results  Component Value Date    HGBA1C 6.6 (H) 02/28/2016   Urine Drug Screen:     Component Value Date/Time   LABOPIA NONE DETECTED 02/28/2016 1746   COCAINSCRNUR NONE DETECTED 02/28/2016 1746   LABBENZ NONE DETECTED 02/28/2016 1746   AMPHETMU NONE DETECTED 02/28/2016 1746   THCU NONE DETECTED 02/28/2016 1746   LABBARB NONE DETECTED 02/28/2016 1746      IMAGING  Ct Angio Head and Neck W Or Wo Contrast 02/28/2016 1. Negative for emergent large vessel occlusion.  2. Minimal atherosclerosis in the neck without stenosis. No anterior circulation large vessel or first order vessel stenosis.  3. Moderate to severe stenosis in the left ACA pericallosal artery and bilateral MCA distal posterior M2 or M3 branches.  4. Mild to moderate stenosis at the right PCA origin is new since 2014. Moderate to severe right P2 stenoses are new or progressed since 2014.  5. Mild to moderate aortic arch calcified atherosclerosis.     Ct Head Code Stroke W/o Cm 02/28/2016 1. Stable CT appearance of chronic small vessel disease. No acute intracranial hemorrhage or acute cortically based infarct identified.  2. ASPECTS is 10.     MR MRA Head without Contrast 02/29/2016 No acute infarction. Moderate chronic small-vessel ischemic changes of the cerebral hemispheric deep white matter. No  intracranial major vessel occlusion or correctable proximal stenosis. Distal vessel atherosclerotic narrowing and irregularity most notable in multiple M2 segments bilaterally and in the PCAs, right more than left.    PHYSICAL EXAM comfortable Neck supple without any lymphadenopathy Lungs clear to auscultation CVS S1-S2 regular Psychiatric normal inside Neurologic Alert and oriented 3 Cranial nerves II through XII intact Motor exam did not reveal any focal weakness, Finger to nose test is normal Speech initially word finding difficulty which resolved in the next 10 minutes. Gait not assessed    ASSESSMENT/PLAN Ms. Lauren [name] is a  82 y.o. female with history of previous TIAs, venous insufficiency, melanoma of the left upper extremity, hyperlipidemia, and the smallest, and chronic headaches, presenting with some difficulties. She did not receive IV t-PA due to improvement in deficits.  Possible TIA: Dominant   Resultant - improvement in deficits  MRI  no acute infarct  MRA  no high-grade stenosis   CTA - moderate to severe cerebrovascular disease as noted above  Carotid Doppler - CTA neck  EEG - pending  2D Echo  pending  LDL - 55  HgbA1c - 6.6  VTE prophylaxis - Lovenox Diet heart healthy/carb modified Room service appropriate? Yes; Fluid consistency: Thin  clopidogrel 75 mg daily prior to admission, now on clopidogrel 75 mg daily  Patient counseled to be compliant with her antithrombotic medications  Ongoing aggressive stroke risk factor management  Therapy recommendations: pending  Disposition:  Pending  Hypertension  Stable  Permissive hypertension (OK if < 220/120) but gradually normalize in 5-7 days  Long-term BP goal normotensive  Hyperlipidemia  Home meds:  Zocor 20 mg daily resumed in hospital  LDL 55, goal < 70  Continue statin at discharge    Other Stroke Risk Factors  Advanced age  Hx stroke/TIA  Family hx stroke (mother, father, sisters, and brothers)   Other Active Problems  Aspirin intolerance secondary to GI upset  Consider dual antiplatelet therapy secondary to CTA findings.  Hospital day # 0  Mikey Bussing PA-C [facility] Pager [phone] 02/29/2016, 5:02 PM I have personally examined this patient, reviewed notes, independently viewed imaging studies, participated in medical decision making and plan of care.ROS completed by me personally and pertinent positives fully documented  I have made any additions or clarifications directly to the above note. Agree with note above. She presented transient speech difficulties with negative brain  imaging. Differential includes TIA versus seizure. Check EEG. D/w patient and family and answered questions. Greater than 50% time during this 25 min visit was spent on counselling and coordination of care about TIA and seizures  Antony Contras, [place] Pager: [phone] 02/29/2016 5:44 PM   To contact Stroke Continuity provider, please refer to [email or web address] After hours, contact General Neurology

## 2016-03-01 DIAGNOSIS — G458 Other transient cerebral ischemic attacks and related syndromes: Secondary | ICD-10-CM | POA: Diagnosis not present

## 2016-03-01 DIAGNOSIS — G459 Transient cerebral ischemic attack, unspecified: Secondary | ICD-10-CM | POA: Diagnosis not present

## 2016-03-01 DIAGNOSIS — C50912 Malignant neoplasm of unspecified site of left female breast: Secondary | ICD-10-CM | POA: Diagnosis not present

## 2016-03-01 DIAGNOSIS — E7800 Pure hypercholesterolemia, unspecified: Secondary | ICD-10-CM

## 2016-03-01 LAB — GLUCOSE, CAPILLARY
GLUCOSE-CAPILLARY: 176 mg/dL — AB (ref 65–99)
GLUCOSE-CAPILLARY: 128 mg/dL — AB (ref 65–99)
Glucose-Capillary: 123 mg/dL — ABNORMAL HIGH (ref 65–99)
Glucose-Capillary: 89 mg/dL (ref 65–99)

## 2016-03-01 LAB — HEMOGLOBIN A1C
HEMOGLOBIN A1C: 6.7 % — AB (ref 4.8–5.6)
Mean Plasma Glucose: 146 mg/dL

## 2016-03-01 MED ORDER — ASPIRIN EC 325 MG PO TBEC
325.0000 mg | DELAYED_RELEASE_TABLET | Freq: Every day | ORAL | Status: DC
  Administered 2016-03-01 – 2016-03-02 (×2): 325 mg via ORAL
  Filled 2016-03-01 (×2): qty 1

## 2016-03-01 NOTE — Progress Notes (Signed)
PROGRESS NOTE    Lauren [name]  [id number] DOB: 1928/01/13 DOA: 02/28/2016 PCP: [name], MD    Brief Narrative:   Lauren [name] is a 82 y.o. female with medical history significant for  Breast Cancer, DM, HLD, h/o TIAs brought to the ED from dermatologist office around 10:30 , when she had difficulty finding words, unable to respond questions, resolving after a few minutes. Symptoms are similar for those of 2010. No confusion or seizures. No LOC. No falls. Denies vertigo dizziness or vision changes. Denies headaches, or dysphagia.  Denies any chest pain, or shortness of breath. Denies any fever or chills, or night sweats. No tobacco. No new meds or hormonal supplements. Does not  take a regular ASA a day (GI upset) , but does take Plavix . Denies any recent long distance trips or recent surgeries. No sick contacts. No new stressors present in personal life Patient is compliant with his medications. She is diabetic. Patient was not administered TPAas symptoms resolved Will admit for further evaluation and treatment. In the emergency department patient was seen and evaluated- BP 170/68   Pulse 70   Temp 98.5 F (36.9 C)   Resp (!) 27   Ht 5\' 5"  (1.651 m)   Wt 55.6 kg (122 lb 9.2 oz)   SpO2 96%   BMI 20.40 kg/m , CT head negative for acute intracranial abnormalities, CTA  Neck without significant occlusion, Admitted for further workup.    Assessment & Plan:   Active Problems:   Breast cancer, left breast ([place])   Diabetes type 2, controlled ([place])   HYPERCHOLESTEROLEMIA, PURE   Migraine variant   Transient ischemic attack   TIA (transient ischemic attack)   Strokelike symptoms,  - slurred speech resolved - h/o TIA 2010 , on Plavix. - Received ASA x1 here  - CT head negative for acute intracranial abnormalities.  - CTA  Neck without significant occlusion .  - MRI/MRA head: No acute infarction. Moderate chronic small-vessel ischemic changes of the cerebral hemispheric deep  white matter.No intracranial major vessel occlusion or correctable proximal stenosis. Distal vessel atherosclerotic narrowing and irregularity most notable in multiple M2 segments bilaterally and in the PCAs, right more than left. - 2D echo pending - A1c 6.6 - plavix discontinued, coated aspirin started - UDS negative - Tylenol for headaches  - EEG ordered by Neurology  Type II Diabetes Current blood sugar level is 119 Recent Labs       Lab Results  Component Value Date   HGBA1C 6.7 (H) 11/07/2015    Hgb A1C Hold home oral diabetic medications.  SSI Heart healthy carb modified diet CBGs well controlled  Hyperlipidemia Continue home statin  GERD,  - no acute symptoms: - Continue PPI - patients niece voices concern about stomach pain with the start of a coat aspirin daily  Depression Continue Celexa   History of L breast Cancer, on remission Follow as OP at [place]     DVT prophylaxis: Lovenox Code Status: Full Code Family Communication: Discussed with patient and patients niece [name] Disposition Plan: pending EEG results as well as PT/OT evaluations   Consultants:   Neurology  SLP   Procedures:   Echo pending  Antimicrobials:   none    Subjective: Patient sitting up in bed eating lunch.  She states she "slept like a baby" last night.  Eating well and says she has been cleaning her plate at each meal. No chest pain, chest pressure, shortness of breath, increased  work of breathing, abdominal pain.  Reports that she feels back to her baseline.  Would like to get up and walk halls as she walks at home significant distances to stay active and healthy.  Patient and her niece are concern about stopping the plavix and starting a coated aspirin as patient has a history of abdominal pain and they are concerned this will aggrevate her abdominal pain.  EEG ordered yesterday but not yet done.  Objective: Vitals:   02/29/16 2128 03/01/16 0123 03/01/16  0542 03/01/16 0915  BP: 125/69 135/71 (!) 107/57 107/63  Pulse: 70 71 75 79  Resp: (!) 28 (!) 22 20 18   Temp: 99.4 F (37.4 C) 98.1 F (36.7 C) 98 F (36.7 C) 98.7 F (37.1 C)  TempSrc: Oral Oral Oral Oral  SpO2: 96% 96% 94% 95%  Weight:      Height:       No intake or output data in the 24 hours ending 03/01/16 1220 Filed Weights   02/28/16 1100 02/28/16 1131  Weight: 55.6 kg (122 lb 9.2 oz) 55.6 kg (122 lb 9.2 oz)    Examination:  General exam: Appears calm and comfortable  Respiratory system: Clear to auscultation. Respiratory effort normal. Cardiovascular system: S1 & S2 heard, RRR. No JVD, murmurs, rubs, gallops or clicks. No pedal edema. Gastrointestinal system: Abdomen is nondistended, soft and nontender. No organomegaly or masses felt. Normal bowel sounds heard. Central nervous system: Alert and oriented. No focal neurological deficits. Extremities: Symmetric 5 x 5 power. Skin: No rashes, lesions or ulcers Psychiatry: Judgement and insight appear normal. Mood & affect appropriate.     Data Reviewed: I have personally reviewed following labs and imaging studies  CBC:  Recent Labs Lab 02/28/16 1105 02/28/16 1113 02/29/16 0210  WBC 10.0  --  9.1  NEUTROABS 7.4  --   --   HGB 13.4 14.6 13.0  HCT 41.6 43.0 40.0  MCV 94.5  --  94.8  PLT 196  --  [id number]   Basic Metabolic Panel:  Recent Labs Lab 02/28/16 1105 02/28/16 1113 02/29/16 0210  NA 136 138 137  K 4.1 4.1 4.0  CL 102 101 103  CO2 25  --  27  GLUCOSE 137* 134* 108*  BUN 15 18 16   CREATININE 0.82 0.80 0.83  CALCIUM 10.0  --  9.6   GFR: Estimated Creatinine Clearance: 42.7 mL/min (by C-G formula based on SCr of 0.83 mg/dL). Liver Function Tests:  Recent Labs Lab 02/28/16 1105 02/29/16 0210  AST 18 15  ALT 12* 11*  ALKPHOS 62 56  BILITOT 0.7 0.5  PROT 6.8 6.4*  ALBUMIN 3.8 3.3*   No results for input(s): LIPASE, AMYLASE in the last 168 hours. No results for input(s): AMMONIA in the  last 168 hours. Coagulation Profile:  Recent Labs Lab 02/28/16 1105  INR 1.06   Cardiac Enzymes: No results for input(s): CKTOTAL, CKMB, CKMBINDEX, TROPONINI in the last 168 hours. BNP (last 3 results) No results for input(s): PROBNP in the last 8760 hours. HbA1C:  Recent Labs  02/28/16 1325  HGBA1C 6.6*   CBG:  Recent Labs Lab 02/29/16 1127 02/29/16 1608 02/29/16 2124 03/01/16 0626 03/01/16 1125  GLUCAP 119* 163* 126* 123* 89   Lipid Profile:  Recent Labs  02/29/16 0210  CHOL 136  HDL 31*  LDLCALC 55  TRIG 252*  CHOLHDL 4.4   Thyroid Function Tests: No results for input(s): TSH, T4TOTAL, FREET4, T3FREE, THYROIDAB in the last 72 hours. Anemia Panel:  No results for input(s): VITAMINB12, FOLATE, FERRITIN, TIBC, IRON, RETICCTPCT in the last 72 hours. Sepsis Labs: No results for input(s): PROCALCITON, LATICACIDVEN in the last 168 hours.  No results found for this or any previous visit (from the past 240 hour(s)).       Radiology Studies: Mr Brain 51 Contrast  Result Date: 02/29/2016 CLINICAL DATA:  Slurred speech and expressive aphasia with right facial droop which occurred yesterday. EXAM: MRI HEAD WITHOUT CONTRAST MRA HEAD WITHOUT CONTRAST TECHNIQUE: Multiplanar, multiecho pulse sequences of the brain and surrounding structures were obtained without intravenous contrast. Angiographic images of the head were obtained using MRA technique without contrast. COMPARISON:  CT and CT angiography 02/28/2016.  MRI 10/27/2012. FINDINGS: MRI HEAD FINDINGS Brain: Diffusion imaging does not show any acute or subacute infarction. The brainstem and cerebellum are normal. Cerebral hemispheres show moderate chronic small-vessel ischemic changes affecting the deep and subcortical white matter. No cortical or large vessel territory infarction. No mass lesion, hemorrhage, hydrocephalus or extra-axial collection. Vascular: Major vessels at the base of the brain show flow. Skull and  upper cervical spine: Negative Sinuses/Orbits: Clear/normal Other: None significant MRA HEAD FINDINGS Both internal carotid arteries are patent into the brain. There is narrowing and irregularity in the carotid siphon regions but no likely flow limiting stenosis. The anterior and middle cerebral vessels are patent bilaterally with atherosclerotic irregularity of M2 branch vessels bilaterally but no correctable proximal stenosis demonstrated. Both vertebral arteries are widely patent to the basilar. No basilar stenosis. Both posterior inferior cerebellar arteries widely patent. Right anterior inferior cerebellar artery is demonstrated. Both superior cerebellar arteries patent. Both posterior cerebral arteries are patent without evidence of flow limiting proximal stenosis. More distal branch vessels show some atherosclerotic irregularity and narrowing, worse on the right than the left. IMPRESSION: No acute infarction. Moderate chronic small-vessel ischemic changes of the cerebral hemispheric deep white matter. No intracranial major vessel occlusion or correctable proximal stenosis. Distal vessel atherosclerotic narrowing and irregularity most notable in multiple M2 segments bilaterally and in the PCAs, right more than left. Electronically Signed   By: Nelson Chimes M.D.   On: 02/29/2016 11:41   Mr Jodene Nam Headm  Result Date: 02/29/2016 CLINICAL DATA:  Slurred speech and expressive aphasia with right facial droop which occurred yesterday. EXAM: MRI HEAD WITHOUT CONTRAST MRA HEAD WITHOUT CONTRAST TECHNIQUE: Multiplanar, multiecho pulse sequences of the brain and surrounding structures were obtained without intravenous contrast. Angiographic images of the head were obtained using MRA technique without contrast. COMPARISON:  CT and CT angiography 02/28/2016.  MRI 10/27/2012. FINDINGS: MRI HEAD FINDINGS Brain: Diffusion imaging does not show any acute or subacute infarction. The brainstem and cerebellum are normal. Cerebral  hemispheres show moderate chronic small-vessel ischemic changes affecting the deep and subcortical white matter. No cortical or large vessel territory infarction. No mass lesion, hemorrhage, hydrocephalus or extra-axial collection. Vascular: Major vessels at the base of the brain show flow. Skull and upper cervical spine: Negative Sinuses/Orbits: Clear/normal Other: None significant MRA HEAD FINDINGS Both internal carotid arteries are patent into the brain. There is narrowing and irregularity in the carotid siphon regions but no likely flow limiting stenosis. The anterior and middle cerebral vessels are patent bilaterally with atherosclerotic irregularity of M2 branch vessels bilaterally but no correctable proximal stenosis demonstrated. Both vertebral arteries are widely patent to the basilar. No basilar stenosis. Both posterior inferior cerebellar arteries widely patent. Right anterior inferior cerebellar artery is demonstrated. Both superior cerebellar arteries patent. Both posterior cerebral arteries are patent  without evidence of flow limiting proximal stenosis. More distal branch vessels show some atherosclerotic irregularity and narrowing, worse on the right than the left. IMPRESSION: No acute infarction. Moderate chronic small-vessel ischemic changes of the cerebral hemispheric deep white matter. No intracranial major vessel occlusion or correctable proximal stenosis. Distal vessel atherosclerotic narrowing and irregularity most notable in multiple M2 segments bilaterally and in the PCAs, right more than left. Electronically Signed   By: Nelson Chimes M.D.   On: 02/29/2016 11:41        Scheduled Meds: . aspirin EC  325 mg Oral Daily  . citalopram  5 mg Oral Daily  . enoxaparin (LOVENOX) injection  40 mg Subcutaneous Q24H  . insulin aspart  0-9 Units Subcutaneous TID WC  . pantoprazole  40 mg Oral Daily  . simvastatin  20 mg Oral QHS   Continuous Infusions:    LOS: 0 days    Time spent: 35  minutes    Newman Pies, MD [facility] Pager [phone]  If 7PM-7AM, please contact night-coverage [email or web address] Password [email or web address] 03/01/2016, 12:20 PM

## 2016-03-01 NOTE — Progress Notes (Signed)
STROKE TEAM PROGRESS NOTE   HISTORY OF PRESENT ILLNESS (per record) Lauren [name] is an 82 y.o. female with history of diabetes and hyperlipidemia, TIA came in with code stroke. Paramedics reported around 10:30 AM she had acute onset of word finding difficulties. Upon arrival to the emergency room patient had mild word finding difficulty which improved in the next 10 minutes. She did not had any focal weakness. CT scan of the head is negative for any acute abnormalities, it shows bilateral signs of small vessel disease. CTA head and neck did not show significant large vessel occlusion, it did show signs of cerebral vascular disease mostly in distally. She takes Plavix and statin therapy at home. Reports stomach upset with aspirin.   SUBJECTIVE (INTERVAL HISTORY) No family members present. Dr. Leonie Man explained that the patient's MRI was negative for a stroke. Suspect TIA or possible seizure.   OBJECTIVE Temp:  [98 F (36.7 C)-99.4 F (37.4 C)] 98.6 F (37 C) (10/08 1307) Pulse Rate:  [65-80] 80 (10/08 1307) Cardiac Rhythm: Normal sinus rhythm (10/08 0700) Resp:  [18-28] 18 (10/08 1307) BP: (107-161)/(57-74) 119/66 (10/08 1307) SpO2:  [94 %-98 %] 96 % (10/08 1307)  CBC:   Recent Labs Lab 02/28/16 1105 02/28/16 1113 02/29/16 0210  WBC 10.0  --  9.1  NEUTROABS 7.4  --   --   HGB 13.4 14.6 13.0  HCT 41.6 43.0 40.0  MCV 94.5  --  94.8  PLT 196  --  [id number]    Basic Metabolic Panel:   Recent Labs Lab 02/28/16 1105 02/28/16 1113 02/29/16 0210  NA 136 138 137  K 4.1 4.1 4.0  CL 102 101 103  CO2 25  --  27  GLUCOSE 137* 134* 108*  BUN 15 18 16   CREATININE 0.82 0.80 0.83  CALCIUM 10.0  --  9.6    Lipid Panel:     Component Value Date/Time   CHOL 136 02/29/2016 0210   TRIG 252 (H) 02/29/2016 0210   HDL 31 (L) 02/29/2016 0210   CHOLHDL 4.4 02/29/2016 0210   VLDL 50 (H) 02/29/2016 0210   LDLCALC 55 02/29/2016 0210   HgbA1c:  Lab Results  Component Value Date   HGBA1C 6.7 (H) 02/29/2016   Urine Drug Screen:     Component Value Date/Time   LABOPIA NONE DETECTED 02/28/2016 1746   COCAINSCRNUR NONE DETECTED 02/28/2016 1746   LABBENZ NONE DETECTED 02/28/2016 1746   AMPHETMU NONE DETECTED 02/28/2016 1746   THCU NONE DETECTED 02/28/2016 1746   LABBARB NONE DETECTED 02/28/2016 1746      IMAGING  Ct Angio Head and Neck W Or Wo Contrast 02/28/2016 1. Negative for emergent large vessel occlusion.  2. Minimal atherosclerosis in the neck without stenosis. No anterior circulation large vessel or first order vessel stenosis.  3. Moderate to severe stenosis in the left ACA pericallosal artery and bilateral MCA distal posterior M2 or M3 branches.  4. Mild to moderate stenosis at the right PCA origin is new since 2014. Moderate to severe right P2 stenoses are new or progressed since 2014.  5. Mild to moderate aortic arch calcified atherosclerosis.     Ct Head Code Stroke W/o Cm 02/28/2016 1. Stable CT appearance of chronic small vessel disease. No acute intracranial hemorrhage or acute cortically based infarct identified.  2. ASPECTS is 10.     MR MRA Head without Contrast 02/29/2016 No acute infarction. Moderate chronic small-vessel ischemic changes of the cerebral hemispheric deep white matter. No intracranial  major vessel occlusion or correctable proximal stenosis. Distal vessel atherosclerotic narrowing and irregularity most notable in multiple M2 segments bilaterally and in the PCAs, right more than left.    PHYSICAL EXAM comfortable Neck supple without any lymphadenopathy Lungs clear to auscultation CVS S1-S2 regular Psychiatric normal inside Neurologic Alert and oriented 3 Cranial nerves II through XII intact Motor exam did not reveal any focal weakness, Finger to nose test is normal Speech initially word finding difficulty which resolved in the next 10 minutes. Gait not assessed    ASSESSMENT/PLAN Lauren [name] is a 82  y.o. female with history of previous TIAs, venous insufficiency, melanoma of the left upper extremity, hyperlipidemia, and the smallest, and chronic headaches, presenting with some difficulties. She did not receive IV t-PA due to improvement in deficits.  Possible TIA: Dominant   Resultant - improvement in deficits  MRI  no acute infarct  MRA  no high-grade stenosis   CTA - moderate to severe cerebrovascular disease as noted above  Carotid Doppler - CTA neck  EEG - pending  2D Echo  pending  LDL - 55  HgbA1c - 6.6  VTE prophylaxis - Lovenox Diet heart healthy/carb modified Room service appropriate? Yes; Fluid consistency: Thin  clopidogrel 75 mg daily prior to admission, now on clopidogrel 75 mg daily  Patient counseled to be compliant with her antithrombotic medications  Ongoing aggressive stroke risk factor management  Therapy recommendations: pending  Disposition:  Pending  Hypertension  Stable  Permissive hypertension (OK if < 220/120) but gradually normalize in 5-7 days  Long-term BP goal normotensive  Hyperlipidemia  Home meds:  Zocor 20 mg daily resumed in hospital  LDL 55, goal < 70  Continue statin at discharge    Other Stroke Risk Factors  Advanced age  Hx stroke/TIA  Family hx stroke (mother, father, sisters, and brothers)   Other Active Problems  Aspirin intolerance secondary to GI upset  Consider dual antiplatelet therapy secondary to CTA findings.  Hospital day # 0  Mikey Bussing PA-C [facility] Pager [phone] 03/01/2016, 2:14 PM I have personally examined this patient, reviewed notes, independently viewed imaging studies, participated in medical decision making and plan of care.ROS completed by me personally and pertinent positives fully documented  I have made any additions or clarifications directly to the above note. Agree with note above. She presented transient speech difficulties with negative brain  imaging. Differential includes TIA versus seizure. Check EEG. D/w Dr Olam Idler and answered questions. Greater than 50% time during this 15 min visit was spent on counselling and coordination of care about TIA and seizures  Antony Contras, [place] Pager: [phone] 03/01/2016 2:14 PM   To contact Stroke Continuity provider, please refer to [email or web address] After hours, contact General Neurology

## 2016-03-01 NOTE — Evaluation (Signed)
Physical Therapy Evaluation Patient Details Name: Lauren [name] MRN: [id number] DOB: [birth year] Today's Date: 03/01/2016   History of Present Illness  Lauren [name] is a 82 y.o. female with a Past Medical History sig for DM who presents with slurred speech.  Clinical Impression  Patient presents slightly off baseline, but still functioning independently.  On borderline for fall risk per DGI assessment.  Feel will progress without formal follow up PT and feel family and nursing staff capable to walk in hallways.  Will sign off.     Follow Up Recommendations No PT follow up    Equipment Recommendations  None recommended by PT    Recommendations for Other Services       Precautions / Restrictions        Mobility  Bed Mobility Overal bed mobility: Modified Independent                Transfers Overall transfer level: Modified independent                  Ambulation/Gait Ambulation/Gait assistance: Supervision Ambulation Distance (Feet): 200 Feet Assistive device: None Gait Pattern/deviations: Drifts right/left;Decreased stride length     General Gait Details: at times veering with testing  Stairs Stairs: Yes Stairs assistance: Modified independent (Device/Increase time) Stair Management: One rail Left;Alternating pattern;Forwards Number of Stairs: 4    Wheelchair Mobility    Modified Rankin (Stroke Patients Only) Modified Rankin (Stroke Patients Only) Pre-Morbid Rankin Score: No symptoms Modified Rankin: No significant disability     Balance                                 Standardized Balance Assessment Standardized Balance Assessment : Dynamic Gait Index   Dynamic Gait Index Level Surface: Normal Change in Gait Speed: Mild Impairment Gait with Horizontal Head Turns: Mild Impairment Gait with Vertical Head Turns: Mild Impairment Gait and Pivot Turn: Normal Step Over Obstacle: Mild Impairment Step Around Obstacles:  Normal Steps: Mild Impairment Total Score: 19       Pertinent Vitals/Pain Pain Assessment: No/denies pain    Home Living Family/patient expects to be discharged to:: Private residence Living Arrangements: Other relatives (sister with Alzheimer's)   Type of Home: House Home Access: Stairs to enter Entrance Stairs-Rails: Right Entrance Stairs-Number of Steps: 2 Home Layout: Laundry or work area in basement;Two level Home Equipment: Tub bench;Hand held shower head;Grab bars - tub/shower;Bedside commode;Walker - 2 wheels      Prior Function Level of Independence: Independent         Comments: neice drives to store, 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; 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     Hand Dominance   Dominant Hand: Right    Extremity/Trunk Assessment   Upper Extremity Assessment: Overall WFL for tasks assessed           Lower Extremity Assessment: Overall WFL for tasks assessed         Communication   Communication: No difficulties  Cognition Arousal/Alertness: Awake/alert Behavior During Therapy: WFL for tasks assessed/performed Overall Cognitive Status: Within Functional Limits for tasks assessed                      General Comments      Exercises     Assessment/Plan    PT Assessment Patent does not need any further PT services  PT Problem List  PT Treatment Interventions      PT Goals (Current goals can be found in the Care Plan section)  Acute Rehab PT Goals PT Goal Formulation: All assessment and education complete, DC therapy    Frequency     Barriers to discharge        Co-evaluation               End of Session Equipment Utilized During Treatment: Gait belt Activity Tolerance: Patient tolerated treatment well Patient left: in bed;with call bell/phone within reach      Functional Assessment Tool Used: Clinical Judgement Functional Limitation: Mobility: Walking and moving  around Mobility: Walking and Moving Around Current Status [id number]): At least 1 percent but less than 20 percent impaired, limited or restricted Mobility: Walking and Moving Around Goal Status [phone]): At least 1 percent but less than 20 percent impaired, limited or restricted Mobility: Walking and Moving Around Discharge Status [phone]): At least 1 percent but less than 20 percent impaired, limited or restricted    Time: 1705-1730 PT Time Calculation (min) (ACUTE ONLY): 25 min   Charges:   PT Evaluation $PT Eval Moderate Complexity: 1 Procedure PT Treatments $Gait Training: 8-22 mins   PT G Codes:   PT G-Codes **NOT FOR INPATIENT CLASS** Functional Assessment Tool Used: Clinical Judgement Functional Limitation: Mobility: Walking and moving around Mobility: Walking and Moving Around Current Status [id number]): At least 1 percent but less than 20 percent impaired, limited or restricted Mobility: Walking and Moving Around Goal Status [phone]): At least 1 percent but less than 20 percent impaired, limited or restricted Mobility: Walking and Moving Around Discharge Status [phone]): At least 1 percent but less than 20 percent impaired, limited or restricted    Lauren [name] 03/01/2016, 5:40 PM Lauren [name], [place] 03/01/2016

## 2016-03-02 ENCOUNTER — Observation Stay (HOSPITAL_COMMUNITY): Payer: Medicare Other

## 2016-03-02 ENCOUNTER — Ambulatory Visit (HOSPITAL_BASED_OUTPATIENT_CLINIC_OR_DEPARTMENT_OTHER): Payer: Medicare Other

## 2016-03-02 ENCOUNTER — Observation Stay (HOSPITAL_BASED_OUTPATIENT_CLINIC_OR_DEPARTMENT_OTHER)
Admit: 2016-03-02 | Discharge: 2016-03-02 | Disposition: A | Discharge status: Home / Self Care | Payer: Medicare Other | Attending: Family Medicine | Admitting: Family Medicine

## 2016-03-02 DIAGNOSIS — E7800 Pure hypercholesterolemia, unspecified: Secondary | ICD-10-CM | POA: Diagnosis not present

## 2016-03-02 DIAGNOSIS — G451 Carotid artery syndrome (hemispheric): Secondary | ICD-10-CM | POA: Diagnosis not present

## 2016-03-02 DIAGNOSIS — G43809 Other migraine, not intractable, without status migrainosus: Secondary | ICD-10-CM

## 2016-03-02 DIAGNOSIS — R4789 Other speech disturbances: Secondary | ICD-10-CM | POA: Diagnosis not present

## 2016-03-02 DIAGNOSIS — C50912 Malignant neoplasm of unspecified site of left female breast: Secondary | ICD-10-CM | POA: Diagnosis not present

## 2016-03-02 DIAGNOSIS — G459 Transient cerebral ischemic attack, unspecified: Secondary | ICD-10-CM

## 2016-03-02 DIAGNOSIS — E11319 Type 2 diabetes mellitus with unspecified diabetic retinopathy without macular edema: Secondary | ICD-10-CM

## 2016-03-02 DIAGNOSIS — I10 Essential (primary) hypertension: Secondary | ICD-10-CM

## 2016-03-02 LAB — GLUCOSE, CAPILLARY
GLUCOSE-CAPILLARY: 119 mg/dL — AB (ref 65–99)
GLUCOSE-CAPILLARY: 165 mg/dL — AB (ref 65–99)
GLUCOSE-CAPILLARY: 83 mg/dL (ref 65–99)

## 2016-03-02 LAB — ECHOCARDIOGRAM COMPLETE
HEIGHTINCHES: 65 in
WEIGHTICAEL: 1961.21 [oz_av]

## 2016-03-02 MED ORDER — CLOPIDOGREL BISULFATE 75 MG PO TABS: 75.0000 mg | ORAL_TABLET | Freq: Every day | ORAL | Status: DC

## 2016-03-02 MED ORDER — ACETAMINOPHEN 325 MG PO TABS: 650.0000 mg | ORAL_TABLET | Freq: Four times a day (QID) | ORAL | Status: AC | PRN

## 2016-03-02 NOTE — Progress Notes (Signed)
EEG Completed; Results Pending  

## 2016-03-02 NOTE — Progress Notes (Signed)
Patient will be discharged to home. Discharge instructions were given to patient and family. Patient is waiting for family members to take her home.

## 2016-03-02 NOTE — Progress Notes (Signed)
PROGRESS NOTE    Lauren [name]  [id number] DOB: 06/14/1927 DOA: 02/28/2016 PCP: [name], MD    Brief Narrative:   Lauren [name] is a 82 y.o. female with medical history significant for  Breast Cancer, DM, HLD, h/o TIAs brought to the ED from dermatologist office around 10:30 , when she had difficulty finding words, unable to respond questions, resolving after a few minutes. Symptoms are similar for those of 2010. No confusion or seizures. No LOC. No falls. Denies vertigo dizziness or vision changes. Denies headaches, or dysphagia.  Denies any chest pain, or shortness of breath. Denies any fever or chills, or night sweats. No tobacco. No new meds or hormonal supplements. Does not  take a regular ASA a day (GI upset) , but does take Plavix . Denies any recent long distance trips or recent surgeries. No sick contacts. No new stressors present in personal life Patient is compliant with his medications. She is diabetic. Patient was not administered TPAas symptoms resolved Will admit for further evaluation and treatment. In the emergency department patient was seen and evaluated- BP 170/68   Pulse 70   Temp 98.5 F (36.9 C)   Resp (!) 27   Ht 5\' 5"  (1.651 m)   Wt 55.6 kg (122 lb 9.2 oz)   SpO2 96%   BMI 20.40 kg/m , CT head negative for acute intracranial abnormalities, CTA  Neck without significant occlusion, Admitted for further workup.    Assessment & Plan:   Active Problems:   Breast cancer, left breast ([place])   Diabetes type 2, controlled ([place])   HYPERCHOLESTEROLEMIA, PURE   Migraine variant   Transient ischemic attack   TIA (transient ischemic attack)   Strokelike symptoms,  - slurred speech resolved - h/o TIA 2010 , on Plavix. - Received ASA x1 here  - CT head negative for acute intracranial abnormalities.  - CTA  Neck without significant occlusion .  - MRI/MRA head: No acute infarction. Moderate chronic small-vessel ischemic changes of the cerebral hemispheric deep  white matter.No intracranial major vessel occlusion or correctable proximal stenosis. Distal vessel atherosclerotic narrowing and irregularity most notable in multiple M2 segments bilaterally and in the PCAs, right more than left. - 2D echo pending - A1c 6.6 - plavix discontinued, coated aspirin started - UDS negative - Tylenol for headaches  - EEG negative - continue plavix  Type II Diabetes Current blood sugar level is 119 Recent Labs       Lab Results  Component Value Date   HGBA1C 6.7 (H) 11/07/2015    Hgb A1C Hold home oral diabetic medications.  SSI Heart healthy carb modified diet CBGs well controlled  Hyperlipidemia Continue home statin  GERD,  - no acute symptoms: - Continue PPI - patients niece voices concern about stomach pain with the start of a coat aspirin daily  Depression Continue Celexa   History of L breast Cancer, on remission Follow as OP at [place]     DVT prophylaxis: Lovenox Code Status: Full Code Family Communication: Discussed with patient and patients niece Lauren [name] Disposition Plan: pending EEG results as well as PT/OT evaluations   Consultants:   Neurology  SLP   Procedures:   Echo pending  Antimicrobials:   none    Subjective: Patient voices today she is feeling well.  She ambulated well and is "cleaning her plate".  She voices that she has not had any repeat episodes as she did previously.  Asking when TTE will be done.  Understands that she needs to follow up with PCP outpatient. Denies Chest pain, chest pressure, shortness of breath, increased work of breathing, nausea, vomiting.    Objective: Vitals:   03/02/16 0212 03/02/16 0502 03/02/16 1031 03/02/16 1334  BP: (!) 120/50 135/68 (!) 110/56 (!) 112/58  Pulse: 69 65 67 73  Resp: 20 20 20 20   Temp: 97.6 F (36.4 C) 97.8 F (36.6 C) 98 F (36.7 C) 98.2 F (36.8 C)  TempSrc: Oral Oral Oral Oral  SpO2: 99% 96% 95% 96%  Weight:      Height:         Intake/Output Summary (Last 24 hours) at 03/02/16 1549 Last data filed at 03/02/16 0900  Gross per 24 hour  Intake              240 ml  Output                0 ml  Net              240 ml   Filed Weights   02/28/16 1100 02/28/16 1131  Weight: 55.6 kg (122 lb 9.2 oz) 55.6 kg (122 lb 9.2 oz)    Examination:  General exam: Appears calm and comfortable  Respiratory system: Clear to auscultation. Respiratory effort normal. Cardiovascular system: S1 & S2 heard, RRR. No JVD, murmurs, rubs, gallops or clicks. No pedal edema. Gastrointestinal system: Abdomen is nondistended, soft and nontender. No organomegaly or masses felt. Normal bowel sounds heard. Central nervous system: Alert and oriented. No focal neurological deficits. Extremities: Symmetric 5 x 5 power. Skin: No rashes, lesions or ulcers Psychiatry: Judgement and insight appear normal. Mood & affect appropriate.     Data Reviewed: I have personally reviewed following labs and imaging studies  CBC:  Recent Labs Lab 02/28/16 1105 02/28/16 1113 02/29/16 0210  WBC 10.0  --  9.1  NEUTROABS 7.4  --   --   HGB 13.4 14.6 13.0  HCT 41.6 43.0 40.0  MCV 94.5  --  94.8  PLT 196  --  [id number]   Basic Metabolic Panel:  Recent Labs Lab 02/28/16 1105 02/28/16 1113 02/29/16 0210  NA 136 138 137  K 4.1 4.1 4.0  CL 102 101 103  CO2 25  --  27  GLUCOSE 137* 134* 108*  BUN 15 18 16   CREATININE 0.82 0.80 0.83  CALCIUM 10.0  --  9.6   GFR: Estimated Creatinine Clearance: 42.7 mL/min (by C-G formula based on SCr of 0.83 mg/dL). Liver Function Tests:  Recent Labs Lab 02/28/16 1105 02/29/16 0210  AST 18 15  ALT 12* 11*  ALKPHOS 62 56  BILITOT 0.7 0.5  PROT 6.8 6.4*  ALBUMIN 3.8 3.3*   No results for input(s): LIPASE, AMYLASE in the last 168 hours. No results for input(s): AMMONIA in the last 168 hours. Coagulation Profile:  Recent Labs Lab 02/28/16 1105  INR 1.06   Cardiac Enzymes: No results for input(s):  CKTOTAL, CKMB, CKMBINDEX, TROPONINI in the last 168 hours. BNP (last 3 results) No results for input(s): PROBNP in the last 8760 hours. HbA1C:  Recent Labs  02/29/16 0209  HGBA1C 6.7*   CBG:  Recent Labs Lab 03/01/16 1125 03/01/16 1711 03/01/16 2054 03/02/16 0613 03/02/16 1144  GLUCAP 89 176* 128* 119* 165*   Lipid Profile:  Recent Labs  02/29/16 0210  CHOL 136  HDL 31*  LDLCALC 55  TRIG 252*  CHOLHDL 4.4   Thyroid Function Tests: No results for input(s):  TSH, T4TOTAL, FREET4, T3FREE, THYROIDAB in the last 72 hours. Anemia Panel: No results for input(s): VITAMINB12, FOLATE, FERRITIN, TIBC, IRON, RETICCTPCT in the last 72 hours. Sepsis Labs: No results for input(s): PROCALCITON, LATICACIDVEN in the last 168 hours.  No results found for this or any previous visit (from the past 240 hour(s)).       Radiology Studies: No results found.      Scheduled Meds: . citalopram  5 mg Oral Daily  . [START ON 03/03/2016] clopidogrel  75 mg Oral Daily  . enoxaparin (LOVENOX) injection  40 mg Subcutaneous Q24H  . insulin aspart  0-9 Units Subcutaneous TID WC  . pantoprazole  40 mg Oral Daily  . simvastatin  20 mg Oral QHS   Continuous Infusions:    LOS: 0 days    Time spent: 35 minutes    Newman Pies, MD [facility] Pager [phone]  If 7PM-7AM, please contact night-coverage [email or web address] Password [email or web address] 03/02/2016, 3:49 PM

## 2016-03-02 NOTE — Progress Notes (Signed)
  Echocardiogram 2D Echocardiogram has been performed.  Donata Clay 03/02/2016, 3:36 PM

## 2016-03-02 NOTE — Discharge Summary (Addendum)
Physician Discharge Summary  Lauren [name] [id number] DOB: 04-22-1928 DOA: 02/28/2016  PCP: [name], MD  Admit date: 02/28/2016 Discharge date: 03/02/2016  Admitted From: Home Disposition:  Home  Recommendations for Outpatient Follow-up:  1. Follow up with PCP in 1-2 weeks 2. Schedule follow up at [facility] in 6 weeks 3. Please take Plavix as prescribed 4. Cardiology will contact you to get you set up with a 30 day event monitor  Home Health: No Equipment/Devices: No   Discharge Condition:Stable CODE STATUS: Full Code  Diet recommendation: Heart Healthy  Brief/Interim Summary: Lauren [name] a 82 y.o.femalewith medical history significant for Breast Cancer, DM, HLD, h/o TIAsbrought to the ED from dermatologist office around 10:30 , when she had difficulty finding words, unable to respond questions, resolving after a few minutes. Symptoms are similar for those of 2010. No confusion or seizures. No LOC. No falls. Denies vertigo dizziness or vision changes. Denies headaches, or dysphagia. Denies any chest pain, or shortness of breath. Denies any fever or chills, or night sweats. No tobacco. No new meds or hormonal supplements. Does not take a regular ASA a day (GI upset) , but does take Plavix . Denies any recent long distance trips or recent surgeries. No sick contacts. No new stressors present in personal life Patient is compliant with his medications. She is diabetic. Patient was not administered TPAas symptoms resolved Will admit for further evaluation and treatment. In the emergency department patient was seen and evaluated- BP 170/68  Pulse 70  Temp 98.5 F (36.9 C)  Resp (!) 27  Ht 5\' 5"  (1.651 m)  Wt 55.6 kg (122 lb 9.2 oz)  SpO2 96%  BMI 20.40 kg/m , CT head negative for acute intracranial abnormalities, CTA Neck without significant occlusion, Admitted for further workup. Patient found to be high functioning to not need physical  therapy or occupational therapy. EEG was performed and was negative for seizure activity.  Patient was set up for outpatient 30 cardiac monitoring.  Discharge Diagnoses:  Active Problems:   Breast cancer, left breast (HCC)   Diabetes type 2, controlled ([place])   HYPERCHOLESTEROLEMIA, PURE   Migraine variant   Transient ischemic attack   TIA (transient ischemic attack)    Discharge Instructions  Discharge Instructions    Ambulatory referral to Neurology    Complete by:  As directed    Follow up with Dr. Rexene Alberts at [facility] in 6 weeks. Thanks.   Call MD for:  difficulty breathing, headache or visual disturbances    Complete by:  As directed    Call MD for:  extreme fatigue    Complete by:  As directed    Call MD for:  persistant dizziness or light-headedness    Complete by:  As directed    Call MD for:  persistant nausea and vomiting    Complete by:  As directed    Call MD for:  severe uncontrolled pain    Complete by:  As directed    Call MD for:  temperature >100.4    Complete by:  As directed    Diet - low sodium heart healthy    Complete by:  As directed    Increase activity slowly    Complete by:  As directed        Medication List    TAKE these medications   acetaminophen 325 MG tablet Commonly known as:  TYLENOL Take 2 tablets (650 mg total) by mouth every 6 (six) hours as needed  for mild pain (or Fever >/= 101).   calcium carbonate 500 MG chewable tablet Commonly known as:  TUMS - dosed in mg elemental calcium Chew 1 tablet by mouth daily.   Calcium-Vitamin D 600-200 MG-UNIT tablet Take 1 tablet by mouth 2 (two) times daily. Reported on 08/02/2015   citalopram 10 MG tablet Commonly known as:  CELEXA Take 0.5 tablets (5 mg total) by mouth daily.   clopidogrel 75 MG tablet Commonly known as:  PLAVIX TAKE ONE TABLET BY MOUTH EACH MORNING WITH BREAKFAST   fish oil-omega-3 fatty acids 1000 MG capsule Take 1 g by mouth daily.   glucose blood test strip Commonly  known as:  ONE TOUCH ULTRA TEST USE AS DIRECTED TO CHECK BLOOD SUGAR ONCE A DAY AND AS NEEDED (DX. E11.9)   metFORMIN 500 MG tablet Commonly known as:  GLUCOPHAGE Take 0.5 tablets (250 mg total) by mouth 2 (two) times daily with a meal.   NEXIUM 40 MG capsule Generic drug:  esomeprazole Take 1 capsule (40 mg total) by mouth daily. Name brand please   polyethylene glycol powder powder Commonly known as:  GLYCOLAX/MIRALAX DISSOLVE ONE (1) CAPFUL (17GM) INTO 4 TO8 OUNCES OF FLUID AND TAKE BYMOUTH ONCE DAILY AS DIRECTED   PRESERVISION AREDS 2 PO Take 1 tablet by mouth 2 (two) times daily.   ranitidine 300 MG tablet Commonly known as:  ZANTAC Take 1 tablet (300 mg total) by mouth daily as needed for heartburn.   simethicone 80 MG chewable tablet Commonly known as:  MYLICON Chew 80 mg by mouth every 6 (six) hours as needed for flatulence.   simvastatin 20 MG tablet Commonly known as:  ZOCOR TAKE ONE TABLET BY MOUTH EVERY NIGHT AT BEDTIME      Follow-up Information    Star Age, MD. Schedule an appointment as soon as possible for a visit in 6 day(s).   Specialties:  Neurology, Radiology Contact information: [place] [phone] [place] Follow up in 6 week(s).   Contact information: [address]     [place] [phone] [place], MD. Schedule an appointment as soon as possible for a visit in 1 week(s).   Specialties:  Family Medicine, Radiology Contact information: [address] [place] [address] [phone]          Allergies  Allergen Reactions  . Sulfonamide Derivatives Itching  . Aspirin Other (See Comments)    Burning and pain in stomach    Consultations:  Neurology  PT/ OT  Procedures/Studies: Ct Angio Head W Or Wo Contrast  Result Date: 02/28/2016 CLINICAL DATA:  82 year old  female code stroke.   Initial encounter. EXAM: CT ANGIOGRAPHY HEAD AND NECK TECHNIQUE: Multidetector CT imaging of the head and neck was performed using the standard protocol during bolus administration of intravenous contrast. Multiplanar CT image reconstructions and MIPs were obtained to evaluate the vascular anatomy. Carotid stenosis measurements (when applicable) are obtained utilizing NASCET criteria, using the distal internal carotid diameter as the denominator. CONTRAST:  50 mL Isovue 370 COMPARISON:  Noncontrast head CT 1115 hours today, and earlier including intracranial MRA 10/27/2012. FINDINGS: CTA NECK Skeleton: No acute osseous abnormality identified. Visualized paranasal sinuses and mastoids are stable and well pneumatized. Upper chest: Mild mosaic attenuation in the lungs. No superior mediastinal lymphadenopathy. Visualized major airways are patent. Other neck:  Negative thyroid, larynx, pharynx, parapharyngeal spaces, retropharyngeal space, sublingual space, submandibular glands and parotid glands. No cervical lymphadenopathy. Aortic arch: 3 vessel arch configuration. Moderate Calcified aortic atherosclerosis. No great vessel origin stenosis. Right carotid system: Negative right CCA. Minimal atherosclerosis at the right carotid bifurcation. Negative cervical right ICA aside from tortuosity. Left carotid system: Negative left CCA. Negative left carotid bifurcation. Negative cervical left ICA aside from tortuosity. Vertebral arteries:No proximal right subclavian artery stenosis. Minimal calcified plaque at the right vertebral artery origin without stenosis. Mildly tortuous right V1 segment. Mildly dominant right vertebral artery is normal to the skullbase aside from tortuosity. Minimal calcified plaque at the left subclavian origin with no stenosis. Normal left vertebral artery origin. Tortuous left V1 segment. Slightly non dominant left vertebral is negative to the skullbase. CTA HEAD Posterior  circulation: No distal vertebral artery stenosis, the right is dominant. Both PICA origins are normal. Normal vertebrobasilar junction. Mild basilar artery tortuosity. No basilar stenosis. Patent AICA origins. Patent SCA and PCA origins. Mild to moderate irregularity and stenosis at the right PCA origin which is new since 2014 (series 11, image 24). Normal left PCA P1 segment. Normal left PCA branches. On the right there is moderate to severe tandem stenosis in the right P2 segment (series 10, image 21 and also series 12, image 18) which is new/progressed since 2014. Diminutive bilateral posterior communicating arteries. Anterior circulation: Both ICA siphons are patent. Mild to moderate calcified plaque in both siphons with no stenosis. This contra dex the appearance of the left anterior genu on the 2014 MRA where stenosis was suspected. Patent carotid termini. Normal MCA and ACA origins. Dominant right and diminutive left ACA A1 segments. Normal anterior communicating artery. There is mild irregularity and stenosis in the proximal left A2 which may be new. There is severe stenosis in the distal left ACA at the pericallosal level (series 12, image 21) which was not included on the prior MRA. Left MCA M1 segment and bifurcation are patent without stenosis. No left proximal M2 occlusion or lesion identified. Mild left M2 irregularity. There is a severe stenosis in the distal posterior left M2 or posterior M3 (series 12, image 29. This might have been present in 2014. Right MCA M1 segment and bifurcation are patent. No proximal right M2 occlusion or lesion. Mild right M2 irregularity. There is a severe stenosis in a posterior distal M2 or in 3 (series 12, image 13) which may be new since 2014. Venous sinuses: Grossly patent. Anatomic variants: Mildly dominant right vertebral artery. Dominant right ACA A1 segment. Review of the MIP images confirms the above findings IMPRESSION: 1. Negative for emergent large vessel  occlusion. 2. Minimal atherosclerosis in the neck without stenosis. No anterior circulation large vessel or first order vessel stenosis. 3. Moderate to severe stenosis in the left ACA pericallosal artery and bilateral MCA distal posterior M2 or M3 branches. 4. Mild to moderate stenosis at the right PCA origin is new since 2014. Moderate to severe right P2 stenoses are new or progressed since 2014. 5. Mild to moderate aortic arch calcified atherosclerosis. 6. Preliminary report of the above discussed by telephone with Dr. Lurena Joiner on 02/28/2016 at 1127 hours. Electronically Signed   By: Genevie Ann M.D.   On: 02/28/2016 12:01   Ct Angio Neck W Or Wo Contrast  Result Date: 02/28/2016 CLINICAL DATA:  82 year old female code stroke.   Initial encounter. EXAM: CT ANGIOGRAPHY HEAD AND NECK TECHNIQUE: Multidetector CT imaging of the head and neck was  performed using the standard protocol during bolus administration of intravenous contrast. Multiplanar CT image reconstructions and MIPs were obtained to evaluate the vascular anatomy. Carotid stenosis measurements (when applicable) are obtained utilizing NASCET criteria, using the distal internal carotid diameter as the denominator. CONTRAST:  50 mL Isovue 370 COMPARISON:  Noncontrast head CT 1115 hours today, and earlier including intracranial MRA 10/27/2012. FINDINGS: CTA NECK Skeleton: No acute osseous abnormality identified. Visualized paranasal sinuses and mastoids are stable and well pneumatized. Upper chest: Mild mosaic attenuation in the lungs. No superior mediastinal lymphadenopathy. Visualized major airways are patent. Other neck: Negative thyroid, larynx, pharynx, parapharyngeal spaces, retropharyngeal space, sublingual space, submandibular glands and parotid glands. No cervical lymphadenopathy. Aortic arch: 3 vessel arch configuration. Moderate Calcified aortic atherosclerosis. No great vessel origin stenosis. Right carotid system: Negative right CCA. Minimal  atherosclerosis at the right carotid bifurcation. Negative cervical right ICA aside from tortuosity. Left carotid system: Negative left CCA. Negative left carotid bifurcation. Negative cervical left ICA aside from tortuosity. Vertebral arteries:No proximal right subclavian artery stenosis. Minimal calcified plaque at the right vertebral artery origin without stenosis. Mildly tortuous right V1 segment. Mildly dominant right vertebral artery is normal to the skullbase aside from tortuosity. Minimal calcified plaque at the left subclavian origin with no stenosis. Normal left vertebral artery origin. Tortuous left V1 segment. Slightly non dominant left vertebral is negative to the skullbase. CTA HEAD Posterior circulation: No distal vertebral artery stenosis, the right is dominant. Both PICA origins are normal. Normal vertebrobasilar junction. Mild basilar artery tortuosity. No basilar stenosis. Patent AICA origins. Patent SCA and PCA origins. Mild to moderate irregularity and stenosis at the right PCA origin which is new since 2014 (series 11, image 24). Normal left PCA P1 segment. Normal left PCA branches. On the right there is moderate to severe tandem stenosis in the right P2 segment (series 10, image 21 and also series 12, image 18) which is new/progressed since 2014. Diminutive bilateral posterior communicating arteries. Anterior circulation: Both ICA siphons are patent. Mild to moderate calcified plaque in both siphons with no stenosis. This contra dex the appearance of the left anterior genu on the 2014 MRA where stenosis was suspected. Patent carotid termini. Normal MCA and ACA origins. Dominant right and diminutive left ACA A1 segments. Normal anterior communicating artery. There is mild irregularity and stenosis in the proximal left A2 which may be new. There is severe stenosis in the distal left ACA at the pericallosal level (series 12, image 21) which was not included on the prior MRA. Left MCA M1 segment  and bifurcation are patent without stenosis. No left proximal M2 occlusion or lesion identified. Mild left M2 irregularity. There is a severe stenosis in the distal posterior left M2 or posterior M3 (series 12, image 29. This might have been present in 2014. Right MCA M1 segment and bifurcation are patent. No proximal right M2 occlusion or lesion. Mild right M2 irregularity. There is a severe stenosis in a posterior distal M2 or in 3 (series 12, image 13) which may be new since 2014. Venous sinuses: Grossly patent. Anatomic variants: Mildly dominant right vertebral artery. Dominant right ACA A1 segment. Review of the MIP images confirms the above findings IMPRESSION: 1. Negative for emergent large vessel occlusion. 2. Minimal atherosclerosis in the neck without stenosis. No anterior circulation large vessel or first order vessel stenosis. 3. Moderate to severe stenosis in the left ACA pericallosal artery and bilateral MCA distal posterior M2 or M3 branches. 4. Mild to moderate stenosis  at the right PCA origin is new since 2014. Moderate to severe right P2 stenoses are new or progressed since 2014. 5. Mild to moderate aortic arch calcified atherosclerosis. 6. Preliminary report of the above discussed by telephone with Dr. Lurena Joiner on 02/28/2016 at 1127 hours. Electronically Signed   By: Genevie Ann M.D.   On: 02/28/2016 12:01   Mr Brain Wo Contrast  Result Date: 02/29/2016 CLINICAL DATA:  Slurred speech and expressive aphasia with right facial droop which occurred yesterday. EXAM: MRI HEAD WITHOUT CONTRAST MRA HEAD WITHOUT CONTRAST TECHNIQUE: Multiplanar, multiecho pulse sequences of the brain and surrounding structures were obtained without intravenous contrast. Angiographic images of the head were obtained using MRA technique without contrast. COMPARISON:  CT and CT angiography 02/28/2016.  MRI 10/27/2012. FINDINGS: MRI HEAD FINDINGS Brain: Diffusion imaging does not show any acute or subacute infarction. The  brainstem and cerebellum are normal. Cerebral hemispheres show moderate chronic small-vessel ischemic changes affecting the deep and subcortical white matter. No cortical or large vessel territory infarction. No mass lesion, hemorrhage, hydrocephalus or extra-axial collection. Vascular: Major vessels at the base of the brain show flow. Skull and upper cervical spine: Negative Sinuses/Orbits: Clear/normal Other: None significant MRA HEAD FINDINGS Both internal carotid arteries are patent into the brain. There is narrowing and irregularity in the carotid siphon regions but no likely flow limiting stenosis. The anterior and middle cerebral vessels are patent bilaterally with atherosclerotic irregularity of M2 branch vessels bilaterally but no correctable proximal stenosis demonstrated. Both vertebral arteries are widely patent to the basilar. No basilar stenosis. Both posterior inferior cerebellar arteries widely patent. Right anterior inferior cerebellar artery is demonstrated. Both superior cerebellar arteries patent. Both posterior cerebral arteries are patent without evidence of flow limiting proximal stenosis. More distal branch vessels show some atherosclerotic irregularity and narrowing, worse on the right than the left. IMPRESSION: No acute infarction. Moderate chronic small-vessel ischemic changes of the cerebral hemispheric deep white matter. No intracranial major vessel occlusion or correctable proximal stenosis. Distal vessel atherosclerotic narrowing and irregularity most notable in multiple M2 segments bilaterally and in the PCAs, right more than left. Electronically Signed   By: Nelson Chimes M.D.   On: 02/29/2016 11:41   Mr Jodene Nam Headm  Result Date: 02/29/2016 CLINICAL DATA:  Slurred speech and expressive aphasia with right facial droop which occurred yesterday. EXAM: MRI HEAD WITHOUT CONTRAST MRA HEAD WITHOUT CONTRAST TECHNIQUE: Multiplanar, multiecho pulse sequences of the brain and surrounding  structures were obtained without intravenous contrast. Angiographic images of the head were obtained using MRA technique without contrast. COMPARISON:  CT and CT angiography 02/28/2016.  MRI 10/27/2012. FINDINGS: MRI HEAD FINDINGS Brain: Diffusion imaging does not show any acute or subacute infarction. The brainstem and cerebellum are normal. Cerebral hemispheres show moderate chronic small-vessel ischemic changes affecting the deep and subcortical white matter. No cortical or large vessel territory infarction. No mass lesion, hemorrhage, hydrocephalus or extra-axial collection. Vascular: Major vessels at the base of the brain show flow. Skull and upper cervical spine: Negative Sinuses/Orbits: Clear/normal Other: None significant MRA HEAD FINDINGS Both internal carotid arteries are patent into the brain. There is narrowing and irregularity in the carotid siphon regions but no likely flow limiting stenosis. The anterior and middle cerebral vessels are patent bilaterally with atherosclerotic irregularity of M2 branch vessels bilaterally but no correctable proximal stenosis demonstrated. Both vertebral arteries are widely patent to the basilar. No basilar stenosis. Both posterior inferior cerebellar arteries widely patent. Right anterior inferior cerebellar artery is  demonstrated. Both superior cerebellar arteries patent. Both posterior cerebral arteries are patent without evidence of flow limiting proximal stenosis. More distal branch vessels show some atherosclerotic irregularity and narrowing, worse on the right than the left. IMPRESSION: No acute infarction. Moderate chronic small-vessel ischemic changes of the cerebral hemispheric deep white matter. No intracranial major vessel occlusion or correctable proximal stenosis. Distal vessel atherosclerotic narrowing and irregularity most notable in multiple M2 segments bilaterally and in the PCAs, right more than left. Electronically Signed   By: Nelson Chimes M.D.   On:  02/29/2016 11:41   Ct Head Code Stroke W/o Cm  Result Date: 02/28/2016 CLINICAL DATA:  Code stroke. 82 year old female. Frontal headache. Last seen normal 1030 hours. Initial encounter. EXAM: CT HEAD WITHOUT CONTRAST TECHNIQUE: Contiguous axial images were obtained from the base of the skull through the vertex without intravenous contrast. COMPARISON:  Head CT 12/28/2012.  Brain MRI 10/27/2012. FINDINGS: Brain: No midline shift, mass effect, or evidence of intracranial mass lesion. No acute intracranial hemorrhage identified. Patchy and confluent bilateral cerebral white matter hypodensity. Involvement of the deep gray matter nuclei, especially the thalami. More focal chronic white matter encephalomalacia in the right centrum semiovale appears stable since 2014 (series 225). No acute cortically based infarct identified. Vascular: Calcified atherosclerosis at the skull base.Increased conspicuity of the left MCA bifurcation or proximal M2 branch on series 2, image 14 appears not significantly changed since 2014. No suspicious intracranial vascular hyperdensity. Skull: No acute osseous abnormality identified. Sinuses/Orbits: Improved right sphenoid aeration with mild residual mucosal thickening. Other visible paranasal sinuses and mastoids remain clear. Other: No acute orbit or scalp soft tissue findings. ASPECTS [facility] Stroke Program Early CT Score) Total score (0-10 with 10 being normal): 10. IMPRESSION: 1. Stable CT appearance of chronic small vessel disease. No acute intracranial hemorrhage or acute cortically based infarct identified. 2. ASPECTS is 10. 3. The above was relayed by text page to Dr. Elpidio Anis at 11:24 am on 02/28/2016. Electronically Signed   By: Genevie Ann M.D.   On: 02/28/2016 11:27   Echocardiogram:- Left ventricle: The cavity size was normal. There was mild   concentric hypertrophy. Systolic function was vigorous. The   estimated ejection fraction was in the range of 65% to 70%. Wall    motion was normal; there were no regional wall motion   abnormalities. Doppler parameters are consistent with abnormal   left ventricular relaxation (grade 1 diastolic dysfunction).   Doppler parameters are consistent with elevated ventricular   end-diastolic filling pressure. - Aortic valve: Trileaflet; normal thickness leaflets. There was   mild regurgitation. - Aortic root: The aortic root was normal in size. - Mitral valve: Calcified annulus. Mildly thickened leaflets .   There was mild regurgitation. - Left atrium: The atrium was normal in size. - Right ventricle: Systolic function was normal. - Tricuspid valve: There was mild regurgitation. - Pulmonary arteries: Systolic pressure was within the normal   range. - Inferior vena cava: The vessel was normal in size. - Pericardium, extracardiac: There was no pericardial effusion.   Subjective: Patient seen and evaluated.  Feels well.  Voices she has walked and feels back to baseline.  Discussed with her starting plavix and patient voices understanding that she is to monitor for any signs of bleeding.  Discharge Exam: Vitals:   03/02/16 1031 03/02/16 1334  BP: (!) 110/56 (!) 112/58  Pulse: 67 73  Resp: 20 20  Temp: 98 F (36.7 C) 98.2 F (36.8 C)   Vitals:  03/02/16 0212 03/02/16 0502 03/02/16 1031 03/02/16 1334  BP: (!) 120/50 135/68 (!) 110/56 (!) 112/58  Pulse: 69 65 67 73  Resp: 20 20 20 20   Temp: 97.6 F (36.4 C) 97.8 F (36.6 C) 98 F (36.7 C) 98.2 F (36.8 C)  TempSrc: Oral Oral Oral Oral  SpO2: 99% 96% 95% 96%  Weight:      Height:        General: Pt is alert, awake, not in acute distress Cardiovascular: RRR, S1/S2 +, no rubs, no gallops Respiratory: CTA bilaterally, no wheezing, no rhonchi Abdominal: Soft, NT, ND, bowel sounds + Extremities: no edema, no cyanosis    The results of significant diagnostics from this hospitalization (including imaging, microbiology, ancillary and laboratory) are listed  below for reference.     Microbiology: No results found for this or any previous visit (from the past 240 hour(s)).   Labs: BNP (last 3 results) No results for input(s): BNP in the last 8760 hours. Basic Metabolic Panel:  Recent Labs Lab 02/28/16 1105 02/28/16 1113 02/29/16 0210  NA 136 138 137  K 4.1 4.1 4.0  CL 102 101 103  CO2 25  --  27  GLUCOSE 137* 134* 108*  BUN 15 18 16   CREATININE 0.82 0.80 0.83  CALCIUM 10.0  --  9.6   Liver Function Tests:  Recent Labs Lab 02/28/16 1105 02/29/16 0210  AST 18 15  ALT 12* 11*  ALKPHOS 62 56  BILITOT 0.7 0.5  PROT 6.8 6.4*  ALBUMIN 3.8 3.3*   No results for input(s): LIPASE, AMYLASE in the last 168 hours. No results for input(s): AMMONIA in the last 168 hours. CBC:  Recent Labs Lab 02/28/16 1105 02/28/16 1113 02/29/16 0210  WBC 10.0  --  9.1  NEUTROABS 7.4  --   --   HGB 13.4 14.6 13.0  HCT 41.6 43.0 40.0  MCV 94.5  --  94.8  PLT 196  --  183   Cardiac Enzymes: No results for input(s): CKTOTAL, CKMB, CKMBINDEX, TROPONINI in the last 168 hours. BNP: Invalid input(s): POCBNP CBG:  Recent Labs Lab 03/01/16 1711 03/01/16 2054 03/02/16 0613 03/02/16 1144 03/02/16 1634  GLUCAP 176* 128* 119* 165* 83   D-Dimer No results for input(s): DDIMER in the last 72 hours. Hgb A1c  Recent Labs  02/29/16 0209  HGBA1C 6.7*   Lipid Profile  Recent Labs  02/29/16 0210  CHOL 136  HDL 31*  LDLCALC 55  TRIG 252*  CHOLHDL 4.4   Thyroid function studies No results for input(s): TSH, T4TOTAL, T3FREE, THYROIDAB in the last 72 hours.  Invalid input(s): FREET3 Anemia work up No results for input(s): VITAMINB12, FOLATE, FERRITIN, TIBC, IRON, RETICCTPCT in the last 72 hours. Urinalysis    Component Value Date/Time   COLORURINE YELLOW 02/28/2016 1747   APPEARANCEUR CLEAR 02/28/2016 1747   LABSPEC 1.010 02/28/2016 1747   PHURINE 6.5 02/28/2016 1747   GLUCOSEU NEGATIVE 02/28/2016 1747   HGBUR NEGATIVE  02/28/2016 1747   BILIRUBINUR NEGATIVE 02/28/2016 1747   KETONESUR NEGATIVE 02/28/2016 1747   PROTEINUR NEGATIVE 02/28/2016 1747   UROBILINOGEN 0.2 01/12/2013 0132   NITRITE NEGATIVE 02/28/2016 1747   LEUKOCYTESUR NEGATIVE 02/28/2016 1747   Sepsis Labs Invalid input(s): PROCALCITONIN,  WBC,  LACTICIDVEN Microbiology No results found for this or any previous visit (from the past 240 hour(s)).   Time coordinating discharge: Less than 30 minutes  SIGNED:   Newman Pies, MD  [facility] 03/02/2016, 5:04 PM Pager [phone] If  7PM-7AM, please contact night-coverage [email or web address] Password [email or web address]

## 2016-03-02 NOTE — Procedures (Signed)
ELECTROENCEPHALOGRAM REPORT  Date of Study: 03/02/2016  Patient's Name: Lauren [name] MRN: [id number] Date of Birth: [birth year]  Referring Provider: Shanon Brow L. Rinehuls, PA-C  Clinical History: 82 year old woman with word-finding difficulties  Medications: Hospital Medications L1 acetaminophen (TYLENOL) suppository 650 mg  L1 acetaminophen (TYLENOL) tablet 650 mg   aspirin EC tablet 325 mg   bisacodyl (DULCOLAX) suppository 10 mg   citalopram (CELEXA) tablet 5 mg   enoxaparin (LOVENOX) injection 40 mg   HYDROcodone-acetaminophen (NORCO/VICODIN) 5-325 MG per tablet 1-2 tablet   insulin aspart (novoLOG) injection 0-9 Units   magnesium citrate solution 1 Bottle  L2 ondansetron (ZOFRAN) injection 4 mg  L2 ondansetron (ZOFRAN) tablet 4 mg   pantoprazole (PROTONIX) EC tablet 40 mg   senna-docusate (Senokot-S) tablet 1 tablet   simethicone (MYLICON) chewable tablet 80 mg   simvastatin (ZOCOR) tablet 20 mg   traZODone (DESYREL) tablet 25 mg  Outpatient Medications  calcium carbonate (TUMS - DOSED IN MG ELEMENTAL CALCIUM) 500 MG chewable tablet   Calcium-Vitamin D 600-200 MG-UNIT per tablet   citalopram (CELEXA) 10 MG tablet   clopidogrel (PLAVIX) 75 MG tablet   fish oil-omega-3 fatty acids 1000 MG capsule   glucose blood (ONE TOUCH ULTRA TEST) test strip   metFORMIN (GLUCOPHAGE) 500 MG tablet   Multiple Vitamins-Minerals (PRESERVISION AREDS 2 PO)   NEXIUM 40 MG capsule   polyethylene glycol powder (GLYCOLAX/MIRALAX) powder   ranitidine (ZANTAC) 300 MG tablet   simethicone (MYLICON) 80 MG chewable tablet   simvastatin (ZOCOR) 20 MG tablet  Technical Summary: A multichannel digital EEG recording measured by the international 10-20 system with electrodes applied with paste and impedances below 5000 ohms performed in our laboratory with EKG monitoring in an awake and drowsy patient.  Hyperventilation and photic stimulation were not performed.  The digital EEG was referentially  recorded, reformatted, and digitally filtered in a variety of bipolar and referential montages for optimal display.    Description: The patient is awake and drowsy during the recording.  During maximal wakefulness, there is a symmetric, medium voltage 10 Hz posterior dominant rhythm that attenuates with eye opening.  The record is symmetric.  During drowsiness and sleep, there is an increase in theta slowing of the background.  There were no epileptiform discharges or electrographic seizures seen.    EKG lead was unremarkable.  Impression: This awake and drowsy EEG is normal.    Clinical Correlation: A normal EEG does not exclude a clinical diagnosis of epilepsy.  If further clinical questions remain, prolonged EEG may be helpful.  Clinical correlation is advised.   Metta Clines, DO

## 2016-03-02 NOTE — Care Management Obs Status (Signed)
[place] NOTIFICATION   Patient Details  Name: WYLEE RIVERE MRN: [id number] Date of Birth: 12/19/1927   Medicare Observation Status Notification Given:  Yes (MRI negative)    Pollie Friar, RN 03/02/2016, 12:34 PM

## 2016-03-02 NOTE — Care Management Note (Signed)
Case Management Note  Patient Details  Name: Lauren [name] MRN: [id number] Date of Birth: 07/02/1927  Subjective/Objective:       Pt in to r/o CVA. She is from home with her sister.             Action/Plan: No f/u per PT. Patient discharging home with family. No further needs per CM.  Expected Discharge Date:                  Expected Discharge Plan:  Home/Self Care  In-House Referral:     Discharge planning Services     Post Acute Care Choice:    Choice offered to:     DME Arranged:    DME Agency:     HH Arranged:    [place] Agency:     Status of Service:  Completed, signed off  If discussed at [employer] of Stay Meetings, dates discussed:    Additional Comments:  Pollie Friar, RN 03/02/2016, 7:30 PM

## 2016-03-02 NOTE — Progress Notes (Addendum)
STROKE TEAM PROGRESS NOTE   SUBJECTIVE (INTERVAL HISTORY) Niece is at bedside. EEG done this morning and it was normal. Pt speech now is back to baseline as per niece. This is her 3rd similar episodes. She had it twice in 2014 and followed with Dr. Rexene Alberts at [facility].    OBJECTIVE Temp:  [97.6 F (36.4 C)-98.2 F (36.8 C)] 98.2 F (36.8 C) (10/09 1334) Pulse Rate:  [65-81] 73 (10/09 1334) Cardiac Rhythm: Normal sinus rhythm (10/09 0700) Resp:  [18-20] 20 (10/09 1334) BP: (110-135)/(46-68) 112/58 (10/09 1334) SpO2:  [95 %-99 %] 96 % (10/09 1334)  CBC:   Recent Labs Lab 02/28/16 1105 02/28/16 1113 02/29/16 0210  WBC 10.0  --  9.1  NEUTROABS 7.4  --   --   HGB 13.4 14.6 13.0  HCT 41.6 43.0 40.0  MCV 94.5  --  94.8  PLT 196  --  [id number]    Basic Metabolic Panel:   Recent Labs Lab 02/28/16 1105 02/28/16 1113 02/29/16 0210  NA 136 138 137  K 4.1 4.1 4.0  CL 102 101 103  CO2 25  --  27  GLUCOSE 137* 134* 108*  BUN 15 18 16   CREATININE 0.82 0.80 0.83  CALCIUM 10.0  --  9.6    Lipid Panel:     Component Value Date/Time   CHOL 136 02/29/2016 0210   TRIG 252 (H) 02/29/2016 0210   HDL 31 (L) 02/29/2016 0210   CHOLHDL 4.4 02/29/2016 0210   VLDL 50 (H) 02/29/2016 0210   LDLCALC 55 02/29/2016 0210   HgbA1c:  Lab Results  Component Value Date   HGBA1C 6.7 (H) 02/29/2016   Urine Drug Screen:     Component Value Date/Time   LABOPIA NONE DETECTED 02/28/2016 1746   COCAINSCRNUR NONE DETECTED 02/28/2016 1746   LABBENZ NONE DETECTED 02/28/2016 1746   AMPHETMU NONE DETECTED 02/28/2016 1746   THCU NONE DETECTED 02/28/2016 1746   LABBARB NONE DETECTED 02/28/2016 1746      IMAGING I have personally reviewed the radiological images below and agree with the radiology interpretations.  Ct Angio Head and Neck W Or Wo Contrast 02/28/2016 1. Negative for emergent large vessel occlusion.  2. Minimal atherosclerosis in the neck without stenosis. No anterior circulation large  vessel or first order vessel stenosis.  3. Moderate to severe stenosis in the left ACA pericallosal artery and bilateral MCA distal posterior M2 or M3 branches.  4. Mild to moderate stenosis at the right PCA origin is new since 2014. Moderate to severe right P2 stenoses are new or progressed since 2014.  5. Mild to moderate aortic arch calcified atherosclerosis.   Ct Head Code Stroke W/o Cm 02/28/2016 1. Stable CT appearance of chronic small vessel disease. No acute intracranial hemorrhage or acute cortically based infarct identified.  2. ASPECTS is 10.   MR MRA Head without Contrast 02/29/2016 No acute infarction. Moderate chronic small-vessel ischemic changes of the cerebral hemispheric deep white matter. No intracranial major vessel occlusion or correctable proximal stenosis. Distal vessel atherosclerotic narrowing and irregularity most notable in multiple M2 segments bilaterally and in the PCAs, right more than left.  EEG - This awake and drowsy EEG is normal.    TTE - pending   PHYSICAL EXAM  Temp:  [97.6 F (36.4 C)-98.2 F (36.8 C)] 98.2 F (36.8 C) (10/09 1334) Pulse Rate:  [65-81] 73 (10/09 1334) Resp:  [18-20] 20 (10/09 1334) BP: (110-135)/(46-68) 112/58 (10/09 1334) SpO2:  [95 %-99 %] 96 % (  10/09 1334)  General - Well nourished, well developed, in no apparent distress.  Ophthalmologic - Fundi not visualized due to noncooperation.  Cardiovascular - Regular rate and rhythm.  Mental Status -  Level of arousal and orientation to time, place, and person were intact. Language including expression, naming, repetition, comprehension was assessed and found intact.  Cranial Nerves II - XII - II - Visual field intact OU. III, IV, VI - Extraocular movements intact. V - Facial sensation intact bilaterally. VII - Facial movement intact bilaterally. VIII - Hearing & vestibular intact bilaterally. X - Palate elevates symmetrically. XI - Chin turning & shoulder shrug intact  bilaterally. XII - Tongue protrusion intact.  Motor Strength - The patient's strength was normal in all extremities and pronator drift was absent.  Bulk was normal and fasciculations were absent.   Motor Tone - Muscle tone was assessed at the neck and appendages and was normal.  Reflexes - The patient's reflexes were 1+ in all extremities and she had no pathological reflexes.  Sensory - Light touch, temperature/pinprick were assessed and were symmetrical.    Coordination - The patient had normal movements in the hands with no ataxia or dysmetria.  Tremor was absent.  Gait and Station - deferred   ASSESSMENT/PLAN Ms. JAHLISA OWENS is a 82 y.o. female with history of previous TIAs, venous insufficiency, melanoma of the left upper extremity, hyperlipidemia, and the smallest, and chronic headaches, presenting with some difficulties. She did not receive IV t-PA due to improvement in deficits.  Possible TIA vs. Migraine equivalent. Less likely for seizure. Pt had similar episodes in 2014 and TIA work up unremarkable as well as EEG negative. Denies associated HA for the episodes but she did follow with Dr. Rexene Alberts for headache in 2014.   Resultant - speech deficit resolved  MRI  no acute infarct  MRA  athero at b/l M2 and PCAs  CTA - bilateral MCA and right PCA atherosclerosis  EEG - normal  2D Echo  Pending  Recommend 30 day cardiac event monitoring to rule out afib given recurrent episodes and positive result could alter the management.   LDL - 55  HgbA1c - 6.6  VTE prophylaxis - Lovenox Diet heart healthy/carb modified Room service appropriate? Yes; Fluid consistency: Thin  clopidogrel 75 mg daily prior to admission, now on ASA 325mg . However, pt has hx of GI upset with ASA and family concerns about full dose ASA. I feel it is OK to continue plavix for stroke prevention.   Patient counseled to be compliant with her antithrombotic medications  Ongoing aggressive stroke risk  factor management  Therapy recommendations: pending  Disposition:  Follow up with Dr. Rexene Alberts at [facility]  History of possible TIA  Admitted in 10/2012 for speech difficulty - MRI neg, CUS neg - LDL normal and A1C 6.3 - ASA changed to plavix  Followed with Dr. Rexene Alberts in 01/2013 - EEG neg - continued on plavix  Followed with Dr. Rexene Alberts and ? another provider as per pt for HA - put on neurontin  Lost follow up after that but pt sister also following with Dr. Rexene Alberts at [facility]  Hypertension  Stable Permissive hypertension (OK if < 220/120) but gradually normalize in 5-7 days Long-term BP goal normotensive  Hyperlipidemia  Home meds:  Zocor 20 mg daily resumed in hospital  LDL 55, goal < 70  Continue statin at discharge  DM   A1C 6.6, goal < 7.0  Controlled  SSI  Follow up with PCP  Other Stroke Risk Factors  Advanced age  Hx stroke/TIA  Family hx stroke (mother, father, sisters, and brothers)  Left breat Ca s/p surgery, stable   Hospital day # 0  Neurology will sign off. Please call with questions. Pt will follow up with Dr. Rexene Alberts at [facility] in about 6 weeks. Thanks for the consult.  Rosalin Hawking, MD PhD Stroke Neurology 03/02/2016 3:01 PM

## 2016-03-04 ENCOUNTER — Telehealth: Payer: Self-pay

## 2016-03-04 NOTE — Telephone Encounter (Signed)
TCM outreach - 2nd attempt. Mobile phone number listed as second contact number belongs to patient's niece Woodfin Ganja. Advised niece patient's primary phone has busy tone present.

## 2016-03-04 NOTE — Telephone Encounter (Signed)
Transition Care Management Follow-up Telephone Call    Date discharged? 03/04/2016  How have you been since you were released from the hospital? [place].   Any patient concerns? None at this time.    Items Reviewed:  Medications reviewed: Yes  Allergies reviewed: Yes  Dietary changes reviewed: No  Referrals reviewed: No   Functional Questionnaire:  Independent - I Dependent - D    Activities of Daily Living (ADLs):    Personal hygiene - I Dressing - I Eating - I Maintaining continence - I Transferring - I   Independent Activities of Daily Living (iADLs): Basic communication skills - I Transportation - I (pt drives short distances only) Meal preparation  - I Shopping - I Housework - I Managing medications - I  Managing personal finances - I    Confirmed importance and date/time of follow-up visits scheduled YES  Provider Appointment booked with PCP 03/09/2016 @ 1400  Confirmed with patient if condition begins to worsen call PCP or go to the ER.  Patient was given the office number and encouraged to call back with question or concerns: YES

## 2016-03-04 NOTE — Telephone Encounter (Signed)
TCM outreach - 1st attempt. Home phone - Busy tone present.

## 2016-03-09 ENCOUNTER — Encounter: Payer: Self-pay | Admitting: Family Medicine

## 2016-03-09 ENCOUNTER — Ambulatory Visit (INDEPENDENT_AMBULATORY_CARE_PROVIDER_SITE_OTHER): Payer: Medicare Other | Admitting: Family Medicine

## 2016-03-09 VITALS — BP 120/65 | HR 75 | Temp 97.4°F | Ht 62.5 in | Wt 122.0 lb

## 2016-03-09 DIAGNOSIS — Z09 Encounter for follow-up examination after completed treatment for conditions other than malignant neoplasm: Secondary | ICD-10-CM | POA: Diagnosis not present

## 2016-03-09 DIAGNOSIS — E7800 Pure hypercholesterolemia, unspecified: Secondary | ICD-10-CM

## 2016-03-09 DIAGNOSIS — I10 Essential (primary) hypertension: Secondary | ICD-10-CM

## 2016-03-09 DIAGNOSIS — E119 Type 2 diabetes mellitus without complications: Secondary | ICD-10-CM

## 2016-03-09 DIAGNOSIS — Z8673 Personal history of transient ischemic attack (TIA), and cerebral infarction without residual deficits: Secondary | ICD-10-CM

## 2016-03-09 DIAGNOSIS — G458 Other transient cerebral ischemic attacks and related syndromes: Secondary | ICD-10-CM

## 2016-03-09 NOTE — Patient Instructions (Signed)
Continue current medicines -especially the plavix  Take care of yourself  If your symptoms re occur - alert [place] call 911  Your blood pressure and cholesterol and blood sugar remain well controlled Stay active (exercise and eating fish can help HDL)  See neurology as planned

## 2016-03-09 NOTE — Assessment & Plan Note (Signed)
Reviewed most recent hospitalization/ labs/imaging and plan  Currently on plavix Intol of asa (GI) Some stenosis in the L ACA pericallosal artery and bilat MCA distal posterior M2 or M3 branches and R PCA origin Nothing new in neck Expected small vessel dz  Nl EEG Echo with grade 1 DD No arrhythmias  Secondary risk factors controlled- DM, HTN, hyperlipidemia F/u neuro as planned  Urged to seek care emergently if symptoms return

## 2016-03-09 NOTE — Assessment & Plan Note (Signed)
Controlled w/o hypoglycemia Lab Results  Component Value Date   HGBA1C 6.7 (H) 02/29/2016

## 2016-03-09 NOTE — Progress Notes (Signed)
Pre visit review using our clinic review tool, if applicable. No additional management support is needed unless otherwise documented below in the visit note. 

## 2016-03-09 NOTE — Progress Notes (Signed)
Subjective:    Patient ID: Lauren [name], female    DOB: July 17, 1927, 82 y.o.   MRN: [id number]  HPI Here for f/u of hospitalization 10/6 to 03/02/16 for TIA Presented with speech/word finding problems and elevated bp of 170/68 No other symptoms  Cannot tolerate asa On plavix  Has had TIAs in the past w/o source  ? Migraines as a factor in the past   Ct Angio Head W Or Wo Contrast  Result Date: 02/28/2016 CLINICAL DATA:  82 year old female code stroke.   Initial encounter. EXAM: CT ANGIOGRAPHY HEAD AND NECK TECHNIQUE: Multidetector CT imaging of the head and neck was performed using the standard protocol during bolus administration of intravenous contrast. Multiplanar CT image reconstructions and MIPs were obtained to evaluate the vascular anatomy. Carotid stenosis measurements (when applicable) are obtained utilizing NASCET criteria, using the distal internal carotid diameter as the denominator. CONTRAST:  50 mL Isovue 370 COMPARISON:  Noncontrast head CT 1115 hours today, and earlier including intracranial MRA 10/27/2012. FINDINGS: CTA NECK Skeleton: No acute osseous abnormality identified. Visualized paranasal sinuses and mastoids are stable and well pneumatized. Upper chest: Mild mosaic attenuation in the lungs. No superior mediastinal lymphadenopathy. Visualized major airways are patent. Other neck: Negative thyroid, larynx, pharynx, parapharyngeal spaces, retropharyngeal space, sublingual space, submandibular glands and parotid glands. No cervical lymphadenopathy. Aortic arch: 3 vessel arch configuration. Moderate Calcified aortic atherosclerosis. No great vessel origin stenosis. Right carotid system: Negative right CCA. Minimal atherosclerosis at the right carotid bifurcation. Negative cervical right ICA aside from tortuosity. Left carotid system: Negative left CCA. Negative left carotid bifurcation. Negative cervical left ICA aside from tortuosity. Vertebral arteries:No proximal right  subclavian artery stenosis. Minimal calcified plaque at the right vertebral artery origin without stenosis. Mildly tortuous right V1 segment. Mildly dominant right vertebral artery is normal to the skullbase aside from tortuosity. Minimal calcified plaque at the left subclavian origin with no stenosis. Normal left vertebral artery origin. Tortuous left V1 segment. Slightly non dominant left vertebral is negative to the skullbase. CTA HEAD Posterior circulation: No distal vertebral artery stenosis, the right is dominant. Both PICA origins are normal. Normal vertebrobasilar junction. Mild basilar artery tortuosity. No basilar stenosis. Patent AICA origins. Patent SCA and PCA origins. Mild to moderate irregularity and stenosis at the right PCA origin which is new since 2014 (series 11, image 24). Normal left PCA P1 segment. Normal left PCA branches. On the right there is moderate to severe tandem stenosis in the right P2 segment (series 10, image 21 and also series 12, image 18) which is new/progressed since 2014. Diminutive bilateral posterior communicating arteries. Anterior circulation: Both ICA siphons are patent. Mild to moderate calcified plaque in both siphons with no stenosis. This contra dex the appearance of the left anterior genu on the 2014 MRA where stenosis was suspected. Patent carotid termini. Normal MCA and ACA origins. Dominant right and diminutive left ACA A1 segments. Normal anterior communicating artery. There is mild irregularity and stenosis in the proximal left A2 which may be new. There is severe stenosis in the distal left ACA at the pericallosal level (series 12, image 21) which was not included on the prior MRA. Left MCA M1 segment and bifurcation are patent without stenosis. No left proximal M2 occlusion or lesion identified. Mild left M2 irregularity. There is a severe stenosis in the distal posterior left M2 or posterior M3 (series 12, image 29. This might have been present in 2014. Right  MCA M1 segment and bifurcation are  patent. No proximal right M2 occlusion or lesion. Mild right M2 irregularity. There is a severe stenosis in a posterior distal M2 or in 3 (series 12, image 13) which may be new since 2014. Venous sinuses: Grossly patent. Anatomic variants: Mildly dominant right vertebral artery. Dominant right ACA A1 segment. Review of the MIP images confirms the above findings IMPRESSION: 1. Negative for emergent large vessel occlusion. 2. Minimal atherosclerosis in the neck without stenosis. No anterior circulation large vessel or first order vessel stenosis. 3. Moderate to severe stenosis in the left ACA pericallosal artery and bilateral MCA distal posterior M2 or M3 branches. 4. Mild to moderate stenosis at the right PCA origin is new since 2014. Moderate to severe right P2 stenoses are new or progressed since 2014. 5. Mild to moderate aortic arch calcified atherosclerosis. 6. Preliminary report of the above discussed by telephone with Dr. Lurena Joiner on 02/28/2016 at 1127 hours. Electronically Signed   By: Genevie Ann M.D.   On: 02/28/2016 12:01   Ct Angio Neck W Or Wo Contrast  Result Date: 02/28/2016 CLINICAL DATA:  82 year old female code stroke.   Initial encounter. EXAM: CT ANGIOGRAPHY HEAD AND NECK TECHNIQUE: Multidetector CT imaging of the head and neck was performed using the standard protocol during bolus administration of intravenous contrast. Multiplanar CT image reconstructions and MIPs were obtained to evaluate the vascular anatomy. Carotid stenosis measurements (when applicable) are obtained utilizing NASCET criteria, using the distal internal carotid diameter as the denominator. CONTRAST:  50 mL Isovue 370 COMPARISON:  Noncontrast head CT 1115 hours today, and earlier including intracranial MRA 10/27/2012. FINDINGS: CTA NECK Skeleton: No acute osseous abnormality identified. Visualized paranasal sinuses and mastoids are stable and well pneumatized. Upper chest: Mild mosaic  attenuation in the lungs. No superior mediastinal lymphadenopathy. Visualized major airways are patent. Other neck: Negative thyroid, larynx, pharynx, parapharyngeal spaces, retropharyngeal space, sublingual space, submandibular glands and parotid glands. No cervical lymphadenopathy. Aortic arch: 3 vessel arch configuration. Moderate Calcified aortic atherosclerosis. No great vessel origin stenosis. Right carotid system: Negative right CCA. Minimal atherosclerosis at the right carotid bifurcation. Negative cervical right ICA aside from tortuosity. Left carotid system: Negative left CCA. Negative left carotid bifurcation. Negative cervical left ICA aside from tortuosity. Vertebral arteries:No proximal right subclavian artery stenosis. Minimal calcified plaque at the right vertebral artery origin without stenosis. Mildly tortuous right V1 segment. Mildly dominant right vertebral artery is normal to the skullbase aside from tortuosity. Minimal calcified plaque at the left subclavian origin with no stenosis. Normal left vertebral artery origin. Tortuous left V1 segment. Slightly non dominant left vertebral is negative to the skullbase. CTA HEAD Posterior circulation: No distal vertebral artery stenosis, the right is dominant. Both PICA origins are normal. Normal vertebrobasilar junction. Mild basilar artery tortuosity. No basilar stenosis. Patent AICA origins. Patent SCA and PCA origins. Mild to moderate irregularity and stenosis at the right PCA origin which is new since 2014 (series 11, image 24). Normal left PCA P1 segment. Normal left PCA branches. On the right there is moderate to severe tandem stenosis in the right P2 segment (series 10, image 21 and also series 12, image 18) which is new/progressed since 2014. Diminutive bilateral posterior communicating arteries. Anterior circulation: Both ICA siphons are patent. Mild to moderate calcified plaque in both siphons with no stenosis. This contra dex the appearance  of the left anterior genu on the 2014 MRA where stenosis was suspected. Patent carotid termini. Normal MCA and ACA origins. Dominant right and diminutive left  ACA A1 segments. Normal anterior communicating artery. There is mild irregularity and stenosis in the proximal left A2 which may be new. There is severe stenosis in the distal left ACA at the pericallosal level (series 12, image 21) which was not included on the prior MRA. Left MCA M1 segment and bifurcation are patent without stenosis. No left proximal M2 occlusion or lesion identified. Mild left M2 irregularity. There is a severe stenosis in the distal posterior left M2 or posterior M3 (series 12, image 29. This might have been present in 2014. Right MCA M1 segment and bifurcation are patent. No proximal right M2 occlusion or lesion. Mild right M2 irregularity. There is a severe stenosis in a posterior distal M2 or in 3 (series 12, image 13) which may be new since 2014. Venous sinuses: Grossly patent. Anatomic variants: Mildly dominant right vertebral artery. Dominant right ACA A1 segment. Review of the MIP images confirms the above findings IMPRESSION: 1. Negative for emergent large vessel occlusion. 2. Minimal atherosclerosis in the neck without stenosis. No anterior circulation large vessel or first order vessel stenosis. 3. Moderate to severe stenosis in the left ACA pericallosal artery and bilateral MCA distal posterior M2 or M3 branches. 4. Mild to moderate stenosis at the right PCA origin is new since 2014. Moderate to severe right P2 stenoses are new or progressed since 2014. 5. Mild to moderate aortic arch calcified atherosclerosis. 6. Preliminary report of the above discussed by telephone with Dr. Lurena Joiner on 02/28/2016 at 1127 hours. Electronically Signed   By: Genevie Ann M.D.   On: 02/28/2016 12:01   Mr Brain Wo Contrast  Result Date: 02/29/2016 CLINICAL DATA:  Slurred speech and expressive aphasia with right facial droop which occurred  yesterday. EXAM: MRI HEAD WITHOUT CONTRAST MRA HEAD WITHOUT CONTRAST TECHNIQUE: Multiplanar, multiecho pulse sequences of the brain and surrounding structures were obtained without intravenous contrast. Angiographic images of the head were obtained using MRA technique without contrast. COMPARISON:  CT and CT angiography 02/28/2016.  MRI 10/27/2012. FINDINGS: MRI HEAD FINDINGS Brain: Diffusion imaging does not show any acute or subacute infarction. The brainstem and cerebellum are normal. Cerebral hemispheres show moderate chronic small-vessel ischemic changes affecting the deep and subcortical white matter. No cortical or large vessel territory infarction. No mass lesion, hemorrhage, hydrocephalus or extra-axial collection. Vascular: Major vessels at the base of the brain show flow. Skull and upper cervical spine: Negative Sinuses/Orbits: Clear/normal Other: None significant MRA HEAD FINDINGS Both internal carotid arteries are patent into the brain. There is narrowing and irregularity in the carotid siphon regions but no likely flow limiting stenosis. The anterior and middle cerebral vessels are patent bilaterally with atherosclerotic irregularity of M2 branch vessels bilaterally but no correctable proximal stenosis demonstrated. Both vertebral arteries are widely patent to the basilar. No basilar stenosis. Both posterior inferior cerebellar arteries widely patent. Right anterior inferior cerebellar artery is demonstrated. Both superior cerebellar arteries patent. Both posterior cerebral arteries are patent without evidence of flow limiting proximal stenosis. More distal branch vessels show some atherosclerotic irregularity and narrowing, worse on the right than the left. IMPRESSION: No acute infarction. Moderate chronic small-vessel ischemic changes of the cerebral hemispheric deep white matter. No intracranial major vessel occlusion or correctable proximal stenosis. Distal vessel atherosclerotic narrowing and  irregularity most notable in multiple M2 segments bilaterally and in the PCAs, right more than left. Electronically Signed   By: Nelson Chimes M.D.   On: 02/29/2016 11:41   Mr Jodene Nam Headm  Result Date:  02/29/2016 CLINICAL DATA:  Slurred speech and expressive aphasia with right facial droop which occurred yesterday. EXAM: MRI HEAD WITHOUT CONTRAST MRA HEAD WITHOUT CONTRAST TECHNIQUE: Multiplanar, multiecho pulse sequences of the brain and surrounding structures were obtained without intravenous contrast. Angiographic images of the head were obtained using MRA technique without contrast. COMPARISON:  CT and CT angiography 02/28/2016.  MRI 10/27/2012. FINDINGS: MRI HEAD FINDINGS Brain: Diffusion imaging does not show any acute or subacute infarction. The brainstem and cerebellum are normal. Cerebral hemispheres show moderate chronic small-vessel ischemic changes affecting the deep and subcortical white matter. No cortical or large vessel territory infarction. No mass lesion, hemorrhage, hydrocephalus or extra-axial collection. Vascular: Major vessels at the base of the brain show flow. Skull and upper cervical spine: Negative Sinuses/Orbits: Clear/normal Other: None significant MRA HEAD FINDINGS Both internal carotid arteries are patent into the brain. There is narrowing and irregularity in the carotid siphon regions but no likely flow limiting stenosis. The anterior and middle cerebral vessels are patent bilaterally with atherosclerotic irregularity of M2 branch vessels bilaterally but no correctable proximal stenosis demonstrated. Both vertebral arteries are widely patent to the basilar. No basilar stenosis. Both posterior inferior cerebellar arteries widely patent. Right anterior inferior cerebellar artery is demonstrated. Both superior cerebellar arteries patent. Both posterior cerebral arteries are patent without evidence of flow limiting proximal stenosis. More distal branch vessels show some atherosclerotic  irregularity and narrowing, worse on the right than the left. IMPRESSION: No acute infarction. Moderate chronic small-vessel ischemic changes of the cerebral hemispheric deep white matter. No intracranial major vessel occlusion or correctable proximal stenosis. Distal vessel atherosclerotic narrowing and irregularity most notable in multiple M2 segments bilaterally and in the PCAs, right more than left. Electronically Signed   By: Nelson Chimes M.D.   On: 02/29/2016 11:41   Ct Head Code Stroke W/o Cm  Result Date: 02/28/2016 CLINICAL DATA:  Code stroke. 82 year old female. Frontal headache. Last seen normal 1030 hours. Initial encounter. EXAM: CT HEAD WITHOUT CONTRAST TECHNIQUE: Contiguous axial images were obtained from the base of the skull through the vertex without intravenous contrast. COMPARISON:  Head CT 12/28/2012.  Brain MRI 10/27/2012. FINDINGS: Brain: No midline shift, mass effect, or evidence of intracranial mass lesion. No acute intracranial hemorrhage identified. Patchy and confluent bilateral cerebral white matter hypodensity. Involvement of the deep gray matter nuclei, especially the thalami. More focal chronic white matter encephalomalacia in the right centrum semiovale appears stable since 2014 (series 225). No acute cortically based infarct identified. Vascular: Calcified atherosclerosis at the skull base.Increased conspicuity of the left MCA bifurcation or proximal M2 branch on series 2, image 14 appears not significantly changed since 2014. No suspicious intracranial vascular hyperdensity. Skull: No acute osseous abnormality identified. Sinuses/Orbits: Improved right sphenoid aeration with mild residual mucosal thickening. Other visible paranasal sinuses and mastoids remain clear. Other: No acute orbit or scalp soft tissue findings. ASPECTS [facility] Stroke Program Early CT Score) Total score (0-10 with 10 being normal): 10. IMPRESSION: 1. Stable CT appearance of chronic small vessel disease.  No acute intracranial hemorrhage or acute cortically based infarct identified. 2. ASPECTS is 10. 3. The above was relayed by text page to Dr. Elpidio Anis at 11:24 am on 02/28/2016. Electronically Signed   By: Genevie Ann M.D.   On: 02/28/2016 11:27     Some evidence of atherosclerosis intercranial -no hemorrhage or stroke  No significant occl of CTA neck  EEG neg for seizure activity She was set up for outpt cardiac monitoring and  neuro ([facility] Dr Rexene Alberts) fu in 6 wk Started on plavix (she is intol of asa)   Echocardiogram -mild concentric hypertrophy with EF 65-70% and nl wall motion Some mild grade 1 DD    Chemistry      Component Value Date/Time   NA 137 02/29/2016 0210   NA 141 11/20/2013 0958   K 4.0 02/29/2016 0210   K 4.5 11/20/2013 0958   CL 103 02/29/2016 0210   CO2 27 02/29/2016 0210   CO2 25 11/20/2013 0958   BUN 16 02/29/2016 0210   BUN 14.3 11/20/2013 0958   CREATININE 0.83 02/29/2016 0210   CREATININE 0.9 11/20/2013 0958      Component Value Date/Time   CALCIUM 9.6 02/29/2016 0210   CALCIUM 10.1 11/20/2013 0958   ALKPHOS 56 02/29/2016 0210   ALKPHOS 79 11/20/2013 0958   AST 15 02/29/2016 0210   AST 16 11/20/2013 0958   ALT 11 (L) 02/29/2016 0210   ALT 12 11/20/2013 0958   BILITOT 0.5 02/29/2016 0210   BILITOT 0.64 11/20/2013 0958     Lab Results  Component Value Date   WBC 9.1 02/29/2016   HGB 13.0 02/29/2016   HCT 40.0 02/29/2016   MCV 94.8 02/29/2016   PLT 183 02/29/2016   Lab Results  Component Value Date   HGBA1C 6.7 (H) 02/29/2016   Lab Results  Component Value Date   TSH 5.87 (H) 08/02/2015    Lab Results  Component Value Date   CHOL 136 02/29/2016   HDL 31 (L) 02/29/2016   LDLCALC 55 02/29/2016   LDLDIRECT 76.2 04/24/2014   TRIG 252 (H) 02/29/2016   CHOLHDL 4.4 02/29/2016  pt stays very active-she is back to walking This is non fasting  Her trig were up -non fasting   BP Readings from Last 3 Encounters:  03/09/16 140/70  03/02/16 130/60    01/16/16 136/62    Blood pressures at home look good as do pulse rates Rev results with pt  gen 120s or below over 60s-70s Pulse on 70s  Glucose is very well controlled - reviewed her home glucose levels   No more TIA symptoms or episodes  Feels a little tired from hospitalization  Back to her normal in general   Patient Active Problem List   Diagnosis Date Noted  . TIA (transient ischemic attack) 02/28/2016  . GERD (gastroesophageal reflux disease) 08/06/2015  . Hearing loss 08/06/2015  . Estrogen deficiency 05/01/2014  . Left knee pain 09/22/2013  . Chest pain, musculoskeletal 08/05/2013  . Pneumonia involving right lung 07/12/2013  . Encounter for Medicare annual wellness exam 04/04/2013  . Colon cancer screening 04/04/2013  . Hypoglycemia 10/27/2012  . History of melanoma excision 09/27/2012  . History of breast cancer, left 01/08/2011  . History of pelvic fracture 09/17/2010  . DEGENERATIVE JOINT DISEASE 02/13/2010  . Migraine variant 11/06/2009  . Osteopenia 10/18/2008  . Breast cancer, left breast ([place]) 09/10/2008  . Transient ischemic attack 09/10/2008  . Diabetes type 2, controlled ([place]) 10/06/2007  . VENOUS INSUFFICIENCY 10/06/2007  . Diverticulosis of colon 10/06/2007  . FIBROCYSTIC BREAST DISEASE 10/06/2007  . HYPERCHOLESTEROLEMIA, PURE 11/29/2006   Past Medical History:  Diagnosis Date  . Breast cancer ([place])    left breast  . Chronic constipation   . Chronic headaches   . Diabetes mellitus   . Fibrocystic breast   . Gastritis   . Gout    injection in the past  . Hyperlipidemia   . Melanoma in situ  of left upper extremity ([place])   . Pelvic fracture ([place])   . TIA (transient ischemic attack) 08/2008   neg MRI/MRA and CT and carotid dopplers  . Venous insufficiency    Past Surgical History:  Procedure Laterality Date  . APPENDECTOMY    . BREAST CYST ASPIRATION    . BREAST LUMPECTOMY  09/2007   with sentinel LN biopsy  . BREAST LUMPECTOMY      left  . EYE SURGERY     retinal, then cataract   Social History  Substance Use Topics  . Smoking status: Never Smoker  . Smokeless tobacco: Never Used  . Alcohol use No   Family History  Problem Relation Age of Onset  . Breast cancer Sister   . Stroke Sister   . Cancer Sister     breast  . Breast cancer Sister   . Stroke Sister   . Cancer Sister     breast  . Stroke Mother   . Stroke Father   . Stroke Brother   . Cancer Brother     prostate  . Stroke Brother   . Stroke Sister   . CAD Sister    Allergies  Allergen Reactions  . Sulfonamide Derivatives Itching  . Aspirin Other (See Comments)    Burning and pain in stomach   Current Outpatient Prescriptions on File Prior to Visit  Medication Sig Dispense Refill  . acetaminophen (TYLENOL) 325 MG tablet Take 2 tablets (650 mg total) by mouth every 6 (six) hours as needed for mild pain (or Fever >/= 101).    . calcium carbonate (TUMS - DOSED IN MG ELEMENTAL CALCIUM) 500 MG chewable tablet Chew 1 tablet by mouth daily.    . Calcium-Vitamin D 600-200 MG-UNIT per tablet Take 1 tablet by mouth 2 (two) times daily. Reported on 08/02/2015    . citalopram (CELEXA) 10 MG tablet Take 0.5 tablets (5 mg total) by mouth daily. 15 tablet 11  . clopidogrel (PLAVIX) 75 MG tablet TAKE ONE TABLET BY MOUTH EACH MORNING WITH BREAKFAST 30 tablet 11  . fish oil-omega-3 fatty acids 1000 MG capsule Take 1 g by mouth daily.      Marland Kitchen glucose blood (ONE TOUCH ULTRA TEST) test strip USE AS DIRECTED TO CHECK BLOOD SUGAR ONCE A DAY AND AS NEEDED (DX. E11.9) 100 each 1  . metFORMIN (GLUCOPHAGE) 500 MG tablet Take 0.5 tablets (250 mg total) by mouth 2 (two) times daily with a meal. 90 tablet 3  . Multiple Vitamins-Minerals (PRESERVISION AREDS 2 PO) Take 1 tablet by mouth 2 (two) times daily.    Marland Kitchen NEXIUM 40 MG capsule Take 1 capsule (40 mg total) by mouth daily. Name brand please 30 capsule 11  . polyethylene glycol powder (GLYCOLAX/MIRALAX) powder DISSOLVE ONE  (1) CAPFUL (17GM) INTO 4 TO8 OUNCES OF FLUID AND TAKE BYMOUTH ONCE DAILY AS DIRECTED 527 g 11  . ranitidine (ZANTAC) 300 MG tablet Take 1 tablet (300 mg total) by mouth daily as needed for heartburn. 30 tablet 11  . simethicone (MYLICON) 80 MG chewable tablet Chew 80 mg by mouth every 6 (six) hours as needed for flatulence.    . simvastatin (ZOCOR) 20 MG tablet TAKE ONE TABLET BY MOUTH EVERY NIGHT AT BEDTIME 30 tablet 3   No current facility-administered medications on file prior to visit.     Review of Systems Review of Systems  Constitutional: Negative for fever, appetite change, fatigue and unexpected weight change.  Eyes: Negative for pain and visual  disturbance.  Respiratory: Negative for cough and shortness of breath.   Cardiovascular: Negative for cp or palpitations    Gastrointestinal: Negative for nausea, diarrhea and constipation.  Genitourinary: Negative for urgency and frequency.  Skin: Negative for pallor or rash   Neurological: Negative for weakness, light-headedness, numbness and headaches. neg for speech abn or facial droop  Hematological: Negative for adenopathy. Does not bruise/bleed easily.  Psychiatric/Behavioral: Negative for dysphoric mood. The patient is occ nervous/anxious.         Objective:   Physical Exam  Constitutional: She is oriented to person, place, and time. She appears well-developed and well-nourished. No distress.  Well appearing elderly female - states she is back to baseline  HENT:  Head: Normocephalic and atraumatic.  Right Ear: External ear normal.  Left Ear: External ear normal.  Nose: Nose normal.  Mouth/Throat: Oropharynx is clear and moist. No oropharyngeal exudate.  No sinus tenderness No temporal tenderness  No TMJ tenderness  Eyes: Conjunctivae and EOM are normal. Pupils are equal, round, and reactive to light. Right eye exhibits no discharge. Left eye exhibits no discharge. No scleral icterus.  No nystagmus  Neck: Normal range of  motion and full passive range of motion without pain. Neck supple. No JVD present. Carotid bruit is not present. No tracheal deviation present. No thyromegaly present.  Cardiovascular: Normal rate, regular rhythm, normal heart sounds and intact distal pulses.  Exam reveals no gallop.   No murmur heard. Pulmonary/Chest: Effort normal and breath sounds normal. No respiratory distress. She has no wheezes. She has no rales.  No crackles  Abdominal: Soft. Bowel sounds are normal. She exhibits no distension, no abdominal bruit and no mass. There is no tenderness.  Musculoskeletal: She exhibits no edema or tenderness.  Lymphadenopathy:    She has no cervical adenopathy.  Neurological: She is alert and oriented to person, place, and time. She has normal strength and normal reflexes. She displays no atrophy and no tremor. No cranial nerve deficit or sensory deficit. She exhibits normal muscle tone. She displays a negative Romberg sign. Coordination and gait normal.  No focal cerebellar signs   Skin: Skin is warm and dry. No rash noted. No pallor.  Psychiatric: She has a normal mood and affect. Her behavior is normal. Thought content normal.          Assessment & Plan:   Problem List Items Addressed This Visit      Cardiovascular and Mediastinum   Transient ischemic attack    Reviewed most recent hospitalization/ labs/imaging and plan  Currently on plavix Intol of asa (GI) Some stenosis in the L ACA pericallosal artery and bilat MCA distal posterior M2 or M3 branches and R PCA origin Nothing new in neck Expected small vessel dz  Nl EEG Echo with grade 1 DD No arrhythmias  Secondary risk factors controlled- DM, HTN, hyperlipidemia F/u neuro as planned  Urged to seek care emergently if symptoms return          Endocrine   Diabetes type 2, controlled ([place]) - Primary    Controlled w/o hypoglycemia Lab Results  Component Value Date   HGBA1C 6.7 (H) 02/29/2016           Other    HYPERCHOLESTEROLEMIA, PURE    Disc goals for lipids and reasons to control them Rev labs with pt Rev low sat fat diet in detail On statin and diet LDL at goal  HDL is low -disc role of omega 3 and exercise  Other Visit Diagnoses   None.

## 2016-03-09 NOTE — Assessment & Plan Note (Signed)
Disc goals for lipids and reasons to control them Rev labs with pt Rev low sat fat diet in detail On statin and diet LDL at goal  HDL is low -disc role of omega 3 and exercise

## 2016-03-11 ENCOUNTER — Ambulatory Visit (INDEPENDENT_AMBULATORY_CARE_PROVIDER_SITE_OTHER): Payer: Medicare Other | Admitting: Ophthalmology

## 2016-03-19 ENCOUNTER — Ambulatory Visit (INDEPENDENT_AMBULATORY_CARE_PROVIDER_SITE_OTHER): Payer: Medicare Other | Admitting: Neurology

## 2016-03-19 ENCOUNTER — Encounter: Payer: Self-pay | Admitting: Neurology

## 2016-03-19 VITALS — BP 124/62 | HR 82 | Resp 14 | Ht 62.5 in | Wt 121.0 lb

## 2016-03-19 DIAGNOSIS — I639 Cerebral infarction, unspecified: Secondary | ICD-10-CM

## 2016-03-19 DIAGNOSIS — G43809 Other migraine, not intractable, without status migrainosus: Secondary | ICD-10-CM | POA: Diagnosis not present

## 2016-03-19 DIAGNOSIS — G459 Transient cerebral ischemic attack, unspecified: Secondary | ICD-10-CM | POA: Diagnosis not present

## 2016-03-19 NOTE — Patient Instructions (Signed)
Try to hydrate well.  Try to eat a snack around 10:30 AM and 3:30 PM.  Try to rest during the day, but walk about 1/2 mile per day.  We will request a 30 day heart monitor. They will call from [facility] for this to set this up.  Continue your medications.

## 2016-03-19 NOTE — Progress Notes (Signed)
Subjective:    Patient ID: Lauren [name] is a 82 y.o. female.  HPI     Interim history:   Lauren [name] is a very pleasant 82 year old right-handed woman with an underlying medical history of type 2 diabetes, melanoma, history of breast cancer, pelvic fracture, osteopenia, TIAs, venous insufficiency, diverticulosis, hyperlipidemia who presents for a new patient visit after recent hospitalization for suspected TIA. She is accompanied by her niece today. I have previously seen her on 2 occasions a few years ago.   Today, 03/19/2016: feels okay, no CP, no SOB. Per niece, she can become shaky in between meals, she has been tracking her blood pressure, pulse and blood sugar levels. She had 2 interim episodes of very brief problems concentrating and getting her words out since her hospital discharge, lasting less than 2 minutes at a time. One time she did have a little bit of head pressure in the back of her head bilaterally. The discharge diagnosis was TIA versus migraine variant.  She was in the hospital on 02/28/2016 and discharged on 03/02/2016. She presented with difficulty with her speech and shakiness. She did recall that she had a similar episode in 2014. She had an EEG in 2014 which was normal in the awake state. She had an EEG on 03/02/2016 and I reviewed the results: Impression: This awake and drowsy EEG is normal. CT head without contrast was negative for any acute abnormalities, CTA neck was negative for any significant stenosis or occlusion. She had no need for physical therapy and occupational therapy after being evaluated by OT and PT. She was set up for 30 day cardiac monitoring as an outpatient.  She did not have a HA at the time, but in 2014 she did have an associated HA.    I reviewed the scan results:  Ct Angio Head and Neck W Or Wo Contrast 02/28/2016 1. Negative for emergent large vessel occlusion.  2. Minimal atherosclerosis in the neck without stenosis. No anterior circulation  large vessel or first order vessel stenosis.  3. Moderate to severe stenosis in the left ACA pericallosal artery and bilateral MCA distal posterior M2 or M3 branches.  4. Mild to moderate stenosis at the right PCA origin is new since 2014. Moderate to severe right P2 stenoses are new or progressed since 2014.  5. Mild to moderate aortic arch calcified atherosclerosis.   Ct Head Code Stroke W/o Cm 02/28/2016 1. Stable CT appearance of chronic small vessel disease. No acute intracranial hemorrhage or acute cortically based infarct identified.  2. ASPECTS is 10.   MR MRA Head without Contrast 02/29/2016 No acute infarction. Moderate chronic small-vessel ischemic changes of the cerebral hemispheric deep white matter. No intracranial major vessel occlusion or correctable proximal stenosis. Distal vessel atherosclerotic narrowing and irregularity most notable in multiple M2 segments bilaterally and in the PCAs, right more than left.  She had a TTE on 03/02/16 (Dr. Meda Coffee interpreted): Study Conclusions  - Left ventricle: The cavity size was normal. There was mild   concentric hypertrophy. Systolic function was vigorous. The   estimated ejection fraction was in the range of 65% to 70%. Wall   motion was normal; there were no regional wall motion   abnormalities. Doppler parameters are consistent with abnormal   left ventricular relaxation (grade 1 diastolic dysfunction).   Doppler parameters are consistent with elevated ventricular   end-diastolic filling pressure. - Aortic valve: Trileaflet; normal thickness leaflets. There was   mild regurgitation. - Aortic root: The aortic  root was normal in size. - Mitral valve: Calcified annulus. Mildly thickened leaflets .   There was mild regurgitation. - Left atrium: The atrium was normal in size. - Right ventricle: Systolic function was normal. - Tricuspid valve: There was mild regurgitation. - Pulmonary arteries: Systolic pressure was within the  normal   range. - Inferior vena cava: The vessel was normal in size. - Pericardium, extracardiac: There was no pericardial effusion.  Previously:  02/20/13:  I first met her on 11/17/2012 and she reported a TIA-like spell earlier in June and was seen in the hospital by my colleague Dr. Leonie Man. He changed her aspirin to Plavix and held her metformin due to low blood sugar values. She reported being in her normal state of health during the late moring on 10/27/12 when she had sudden onset of R hand and arm shaking, which she not control, and she could not get her words out. Her sister's caregiver called 911. She had a full stroke w/u in the hospital which I reviewed: Her CT showed a small plaque in the MCA M2 branch and white matter changes, she had negative carotid Doppler studies, there was no acute stroke on MRI brain and she had intracranial atherosclerosis on MRA head. Her blood work was unremarkable, with normal lipids, A1c of 6.3. Carotid doppler study in June 2014 showed 0-49% ICA stenosis. She had transient neurological symptoms including difficulty forming her words and right arm shakiness, no facial droop. She has a long-standing history of migraine headaches, but also c/o a bandlike sensation in the front. For her headaches she takes tylenol. She was given neurontin by a HA specialist in the past, but it was not helpful.  Since she was discharged she has had more hand tremor and intermittent zoning out at time, no frank convulsions. She has had a hand tremor for years. Her 2 nephews have tremors.  Since her last visit she presented to the emergency room on 01/11/2013 with chest pain, relieved by nitroglycerin sublingual. An acute coronary syndrome was excluded. She had a stress test: On 01/12/2013 she had a normal Lexi scan Myoview. She had a chest x-ray on 01/11/2013 which was negative for any acute or active cardiopulmonary disease. She had a head CT without contrast on 12/28/2012 which showed no  acute intracranial abnormalities, near-complete opacification of the right sphenoid sinus, mild cerebral atrophy with extensive chronic microvascular ischemic changes. On 12/22/2012 she had a bilateral mammogram showing stable post lumpectomy site in the left breast and probably benign calcifications in the upper outer right breast which appear punctate and loosely grouped.  She saw her primary care physician on 01/25/2013 and endorsed anxiety and stressors. At the time of her first visit with me I ordered an EEG. This was normal in the awake state. I felt that her headaches were primarily tension-type headaches. She also had evidence of essential tremor, very mild. She is doing better in all aspect, she is starting to gain some weight back.    Her Past Medical History Is Significant For: Past Medical History:  Diagnosis Date  . Breast cancer ([place])    left breast  . Chronic constipation   . Chronic headaches   . Diabetes mellitus   . Fibrocystic breast   . Gastritis   . Gout    injection in the past  . Hyperlipidemia   . Melanoma in situ of left upper extremity ([place])   . Pelvic fracture ([place])   . TIA (transient ischemic attack) 08/2008  neg MRI/MRA and CT and carotid dopplers  . Venous insufficiency     Her Past Surgical History Is Significant For: Past Surgical History:  Procedure Laterality Date  . APPENDECTOMY    . BREAST CYST ASPIRATION    . BREAST LUMPECTOMY  09/2007   with sentinel LN biopsy  . BREAST LUMPECTOMY     left  . EYE SURGERY     retinal, then cataract    Her Family History Is Significant For: Family History  Problem Relation Age of Onset  . Breast cancer Sister   . Stroke Sister   . Cancer Sister     breast  . Breast cancer Sister   . Stroke Sister   . Cancer Sister     breast  . Stroke Mother   . Stroke Father   . Stroke Brother   . Cancer Brother     prostate  . Stroke Brother   . Stroke Sister   . CAD Sister     Her Social History Is  Significant For: Social History   Social History  . Marital status: Single    Spouse name: N/A  . Number of children: N/A  . Years of education: college   Social History Main Topics  . Smoking status: Never Smoker  . Smokeless tobacco: Never Used  . Alcohol use No  . Drug use: No  . Sexual activity: Not Currently   Other Topics Concern  . None   Social History Narrative   Lives with and cares for both sisters   Strong faith   Denies caffeine use     Her Allergies Are:  Allergies  Allergen Reactions  . Sulfonamide Derivatives Itching  . Aspirin Other (See Comments)    Burning and pain in stomach  :   Her Current Medications Are:  Outpatient Encounter Prescriptions as of 03/19/2016  Medication Sig  . acetaminophen (TYLENOL) 325 MG tablet Take 2 tablets (650 mg total) by mouth every 6 (six) hours as needed for mild pain (or Fever >/= 101).  . calcium carbonate (TUMS - DOSED IN MG ELEMENTAL CALCIUM) 500 MG chewable tablet Chew 1 tablet by mouth daily.  . Calcium-Vitamin D 600-200 MG-UNIT per tablet Take 1 tablet by mouth 2 (two) times daily. Reported on 08/02/2015  . citalopram (CELEXA) 10 MG tablet Take 0.5 tablets (5 mg total) by mouth daily.  . clopidogrel (PLAVIX) 75 MG tablet TAKE ONE TABLET BY MOUTH EACH MORNING WITH BREAKFAST  . fish oil-omega-3 fatty acids 1000 MG capsule Take 1 g by mouth daily.    Marland Kitchen glucose blood (ONE TOUCH ULTRA TEST) test strip USE AS DIRECTED TO CHECK BLOOD SUGAR ONCE A DAY AND AS NEEDED (DX. E11.9)  . metFORMIN (GLUCOPHAGE) 500 MG tablet Take 0.5 tablets (250 mg total) by mouth 2 (two) times daily with a meal.  . Multiple Vitamins-Minerals (PRESERVISION AREDS 2 PO) Take 1 tablet by mouth 2 (two) times daily.  Marland Kitchen NEXIUM 40 MG capsule Take 1 capsule (40 mg total) by mouth daily. Name brand please  . polyethylene glycol powder (GLYCOLAX/MIRALAX) powder DISSOLVE ONE (1) CAPFUL (17GM) INTO 4 TO8 OUNCES OF FLUID AND TAKE BYMOUTH ONCE DAILY AS DIRECTED   . ranitidine (ZANTAC) 300 MG tablet Take 1 tablet (300 mg total) by mouth daily as needed for heartburn.  . simethicone (MYLICON) 80 MG chewable tablet Chew 80 mg by mouth every 6 (six) hours as needed for flatulence.  . simvastatin (ZOCOR) 20 MG tablet TAKE ONE TABLET BY  MOUTH EVERY NIGHT AT BEDTIME   No facility-administered encounter medications on file as of 03/19/2016.   :  Review of Systems:  Out of a complete 14 point review of systems, all are reviewed and negative with the exception of these symptoms as listed below: Review of Systems  Neurological:       Headaches,  Patient had episode of not being able to form words, and being shaking. Taken to hospital 10/6 and released 10/9. States that she has had 2 more episodes since leaving the hospital.  This has happened to her before in 2014.     Objective:  Neurologic Exam  Physical Exam Physical Examination:   Vitals:   03/19/16 0859  BP: 124/62  Pulse: 82  Resp: 14   General Examination: The patient is a very pleasant 82 y.o. female in no acute distress. She appears well-developed and well-nourished and well groomed. She is in good spirits today.   HEENT: Normocephalic, atraumatic, pupils are equal, round and reactive to light and accommodation. Extraocular tracking is good without limitation to gaze excursion or nystagmus noted. Normal smooth pursuit is noted. Hearing is grossly intact. Face is symmetric with normal facial animation and normal facial sensation. Speech is clear with no dysarthria noted. There is no hypophonia. There is no lip, neck/head, jaw or voice tremor. Neck is supple with full range of passive and active motion. There are no carotid bruits on auscultation. Oropharynx exam reveals: mild mouth dryness, adequate dental hygiene and mild airway crowding. Mallampati is class II. Tongue protrudes centrally and palate elevates symmetrically.   Chest: Clear to auscultation without wheezing, rhonchi or crackles  noted.  Heart: S1+S2+0, regular and normal without murmurs, rubs or gallops noted.   Abdomen: Soft, non-tender and non-distended with normal bowel sounds appreciated on auscultation.  Extremities: There is no pitting edema in the distal lower extremities bilaterally. Pedal pulses are intact.  Skin: Warm and dry without trophic changes noted. There are no varicose veins.  Musculoskeletal: exam reveals no obvious joint deformities, tenderness or joint swelling or erythema.   Neurologically:  Mental status: The patient is awake, alert and oriented in all 4 spheres. Her memory, attention, language and knowledge are appropriate. There is no aphasia, agnosia, apraxia or anomia. Speech is clear with normal prosody and enunciation. Thought process is linear. Mood is congruent and affect is normal.  Cranial nerves are as described above under HEENT exam. In addition, shoulder shrug is normal with equal shoulder height noted. Motor exam: Normal bulk, strength and tone is noted. There is no drift, or rebound. She has a mild intermittent resting tremor on the R hand and a mild bilateral intermittent postural and action tremor bilaterally, R more than L stable from before. Romberg is negative. Reflexes are 2+ throughout. Fine motor skills are minimally impaired bilaterally, not unusual for age.  Cerebellar testing shows no dysmetria or intention tremor on finger to nose testing. There is no truncal or gait ataxia.  Sensory exam is intact to light touch, pinprick, vibration, temperature sense and proprioception in the upper and lower extremities.  Gait, station and balance: She stands up with mild difficulty and her posture is mildly stooped, but fairly age-appropriate. She walks cautiously and turns in 3 steps. She has a mild decrease in arm swing bilaterally. Tandem walk is difficult for her, not unusual for age.   Assessment and Plan:   In summary, LYDIANA MILLEY is a very pleasant 82 year old female  with a history of  headaches, tremors, and Hx of TIAs who presents for follow-up after recent hospitalization for suspected TIA versus migraine variant. She had extensive workup which I reviewed. She she was supposed to be set up for a 30 day heart monitor which has not happened. I will request is for completion. She denies any heart related problems including palpitations, shortness of breath, chest pain. She has had interim mild headache, some fluctuation in her blood sugar values and mild fluctuation in her blood pressure values. She had a couple of episodes of decrease ability to concentrate and word finding difficulties, no one-sided weakness or numbness, no droopy face. I suggested she continue with her current medications, stay well-hydrated, exercise regularly in the form of walking, maybe half a mile per day, and rest well, maybe a nap in the afternoon if needed. We will call her with her cardiac monitor results, I will see her back in 6 months, sooner as needed. Exam is nonfocal today and unchanged with regards to her known tremor. I answered all her questions today and the patient and her knees were in agreement.

## 2016-03-25 ENCOUNTER — Ambulatory Visit (INDEPENDENT_AMBULATORY_CARE_PROVIDER_SITE_OTHER): Payer: Medicare Other | Admitting: Ophthalmology

## 2016-03-25 DIAGNOSIS — H43812 Vitreous degeneration, left eye: Secondary | ICD-10-CM | POA: Diagnosis not present

## 2016-03-25 DIAGNOSIS — E11319 Type 2 diabetes mellitus with unspecified diabetic retinopathy without macular edema: Secondary | ICD-10-CM | POA: Diagnosis not present

## 2016-03-25 DIAGNOSIS — H353111 Nonexudative age-related macular degeneration, right eye, early dry stage: Secondary | ICD-10-CM

## 2016-03-25 DIAGNOSIS — H353122 Nonexudative age-related macular degeneration, left eye, intermediate dry stage: Secondary | ICD-10-CM | POA: Diagnosis not present

## 2016-03-25 DIAGNOSIS — E113293 Type 2 diabetes mellitus with mild nonproliferative diabetic retinopathy without macular edema, bilateral: Secondary | ICD-10-CM

## 2016-03-27 DIAGNOSIS — Z85820 Personal history of malignant melanoma of skin: Secondary | ICD-10-CM | POA: Diagnosis not present

## 2016-03-27 DIAGNOSIS — D2239 Melanocytic nevi of other parts of face: Secondary | ICD-10-CM | POA: Diagnosis not present

## 2016-03-27 DIAGNOSIS — L814 Other melanin hyperpigmentation: Secondary | ICD-10-CM | POA: Diagnosis not present

## 2016-03-27 DIAGNOSIS — D1801 Hemangioma of skin and subcutaneous tissue: Secondary | ICD-10-CM | POA: Diagnosis not present

## 2016-03-27 DIAGNOSIS — D2272 Melanocytic nevi of left lower limb, including hip: Secondary | ICD-10-CM | POA: Diagnosis not present

## 2016-03-27 DIAGNOSIS — L821 Other seborrheic keratosis: Secondary | ICD-10-CM | POA: Diagnosis not present

## 2016-03-27 DIAGNOSIS — D225 Melanocytic nevi of trunk: Secondary | ICD-10-CM | POA: Diagnosis not present

## 2016-03-27 DIAGNOSIS — Z85828 Personal history of other malignant neoplasm of skin: Secondary | ICD-10-CM | POA: Diagnosis not present

## 2016-03-30 ENCOUNTER — Encounter: Payer: Self-pay | Admitting: Neurology

## 2016-03-31 ENCOUNTER — Ambulatory Visit (INDEPENDENT_AMBULATORY_CARE_PROVIDER_SITE_OTHER): Payer: Medicare Other

## 2016-03-31 DIAGNOSIS — G459 Transient cerebral ischemic attack, unspecified: Secondary | ICD-10-CM

## 2016-05-06 ENCOUNTER — Ambulatory Visit: Payer: Medicare Other | Admitting: Neurology

## 2016-05-07 ENCOUNTER — Telehealth: Payer: Self-pay

## 2016-05-07 NOTE — Telephone Encounter (Signed)
I called pt. I advised her that her cardiac monitor event showed benign findings; no further action is required at this time. I encouraged pt to keep her follow up appt and to call us with any questions or concerns. Pt verbalized understanding of results. Pt had no questions at this time but was encouraged to call back if questions arise.

## 2016-05-07 NOTE — Progress Notes (Signed)
Please call patient: Cardiac event monitor showed benign findings; no further action required, thx Star Age, MD, PhD [facility] ([place])

## 2016-05-07 NOTE — Telephone Encounter (Signed)
-----   Message from Star Age, MD sent at 05/07/2016  7:34 AM EST ----- Please call patient: Cardiac event monitor showed benign findings; no further action required, thx Star Age, MD, PhD [facility] ([place])

## 2016-05-13 ENCOUNTER — Other Ambulatory Visit (INDEPENDENT_AMBULATORY_CARE_PROVIDER_SITE_OTHER): Payer: Medicare Other

## 2016-05-13 DIAGNOSIS — E11319 Type 2 diabetes mellitus with unspecified diabetic retinopathy without macular edema: Secondary | ICD-10-CM

## 2016-05-13 LAB — COMPREHENSIVE METABOLIC PANEL
ALBUMIN: 4.1 g/dL (ref 3.5–5.2)
ALT: 10 U/L (ref 0–35)
AST: 13 U/L (ref 0–37)
Alkaline Phosphatase: 62 U/L (ref 39–117)
BILIRUBIN TOTAL: 0.7 mg/dL (ref 0.2–1.2)
BUN: 18 mg/dL (ref 6–23)
CALCIUM: 9.6 mg/dL (ref 8.4–10.5)
CO2: 31 meq/L (ref 19–32)
CREATININE: 0.8 mg/dL (ref 0.40–1.20)
Chloride: 104 mEq/L (ref 96–112)
GFR: 72.23 mL/min (ref >60.00)
Glucose, Bld: 125 mg/dL — ABNORMAL HIGH (ref 70–99)
Potassium: 4.1 mEq/L (ref 3.5–5.1)
SODIUM: 142 meq/L (ref 135–145)
Total Protein: 7.3 g/dL (ref 6.0–8.3)

## 2016-05-13 LAB — LIPID PANEL
CHOLESTEROL: 138 mg/dL (ref 0–200)
HDL: 40.7 mg/dL (ref >39.00)
LDL CALC: 67 mg/dL (ref 0–99)
NONHDL: 97.51
Total CHOL/HDL Ratio: 3
Triglycerides: 152 mg/dL — ABNORMAL HIGH (ref 0.0–149.0)
VLDL: 30.4 mg/dL (ref 0.0–40.0)

## 2016-05-13 LAB — HEMOGLOBIN A1C: Hgb A1c MFr Bld: 6.9 % — ABNORMAL HIGH (ref 4.6–6.5)

## 2016-05-20 ENCOUNTER — Ambulatory Visit (INDEPENDENT_AMBULATORY_CARE_PROVIDER_SITE_OTHER): Payer: Medicare Other | Admitting: Family Medicine

## 2016-05-20 ENCOUNTER — Encounter: Payer: Self-pay | Admitting: Family Medicine

## 2016-05-20 VITALS — BP 118/56 | HR 90 | Temp 98.1°F | Ht 62.5 in | Wt 122.5 lb

## 2016-05-20 DIAGNOSIS — I639 Cerebral infarction, unspecified: Secondary | ICD-10-CM | POA: Diagnosis not present

## 2016-05-20 DIAGNOSIS — E7800 Pure hypercholesterolemia, unspecified: Secondary | ICD-10-CM | POA: Diagnosis not present

## 2016-05-20 DIAGNOSIS — G458 Other transient cerebral ischemic attacks and related syndromes: Secondary | ICD-10-CM

## 2016-05-20 DIAGNOSIS — E11319 Type 2 diabetes mellitus with unspecified diabetic retinopathy without macular edema: Secondary | ICD-10-CM | POA: Diagnosis not present

## 2016-05-20 NOTE — Assessment & Plan Note (Signed)
So far neg w/u neuro and cardiac monitor On plavix bp is well controlled Cholesterol well controlled  Rev imaging No further symptoms

## 2016-05-20 NOTE — Assessment & Plan Note (Signed)
Fairly stable with metformin and diet Lab Results  Component Value Date   HGBA1C 6.9 (H) 05/13/2016   Watching for hypoglycemia  F/u 6 mo  She is utd eye exam

## 2016-05-20 NOTE — Patient Instructions (Addendum)
Keep eating a healthy diet Stay as active as you can be  Get your eye exam when scheduled   Follow up in 6 months for AMW visit and annual exam with me

## 2016-05-20 NOTE — Assessment & Plan Note (Signed)
Disc goals for lipids and reasons to control them Rev labs with pt Rev low sat fat diet in detail HDL is up  Improved  Statin and diet  Continue to follow  Pt has hx of TIAs

## 2016-05-20 NOTE — Progress Notes (Signed)
Subjective:    Patient ID: Lauren [name], female    DOB: [birth year], 82 y.o.   MRN: [id number]  HPI Here for 6 mo f/u of chronic medical problems   Feels weak and tired at times Chronic stomach c/o - feels empty  Not eating much differently  Has stress - and that may make it worse   Sister is now in hospice for dementia-this is hard on her   Neuro continues to f/u for TIAs  Recent cardiac monitor was normal  W/u has been normal  Enc to stay active and eat regular meals  On plavix  No further episodes (after last word finding issues)   Wt Readings from Last 3 Encounters:  05/20/16 122 lb 8 oz (55.6 kg)  03/19/16 121 lb (54.9 kg)  03/09/16 122 lb (55.3 kg)  stable  bmi is 22.0  BP Readings from Last 3 Encounters:  05/20/16 (!) 118/56  03/19/16 124/62  03/09/16 120/65     Diabetes Home sugar results -no low or high readings  Eats snacks  DM diet - good  Exercise -was walking before her hospital visit and weather change/ now has to walk in the house  Symptoms-none  A1C last  Lab Results  Component Value Date   HGBA1C 6.9 (H) 05/13/2016   This is up from 6.7 No problems with medications -on metformin (she does well with it)  Renal protection- microalbumin 3/17 Last eye exam  recent - also has macular degeneration (Dr Satira Sark)    Hx of hyperlipidemia  Lab Results  Component Value Date   CHOL 138 05/13/2016   CHOL 136 02/29/2016   CHOL 161 08/02/2015   Lab Results  Component Value Date   HDL 40.70 05/13/2016   HDL 31 (L) 02/29/2016   HDL 38.30 (L) 08/02/2015   Lab Results  Component Value Date   LDLCALC 67 05/13/2016   LDLCALC 55 02/29/2016   LDLCALC 85 08/02/2015   Lab Results  Component Value Date   TRIG 152.0 (H) 05/13/2016   TRIG 252 (H) 02/29/2016   TRIG 190.0 (H) 08/02/2015   Lab Results  Component Value Date   CHOLHDL 3 05/13/2016   CHOLHDL 4.4 02/29/2016   CHOLHDL 4 08/02/2015   Lab Results  Component Value Date   LDLDIRECT 76.2  04/24/2014   LDLDIRECT 46.3 10/01/2009   On simvastatin and diet  Improved from last time     Chemistry      Component Value Date/Time   NA 142 05/13/2016 0853   NA 141 11/20/2013 0958   K 4.1 05/13/2016 0853   K 4.5 11/20/2013 0958   CL 104 05/13/2016 0853   CO2 31 05/13/2016 0853   CO2 25 11/20/2013 0958   BUN 18 05/13/2016 0853   BUN 14.3 11/20/2013 0958   CREATININE 0.80 05/13/2016 0853   CREATININE 0.9 11/20/2013 0958      Component Value Date/Time   CALCIUM 9.6 05/13/2016 0853   CALCIUM 10.1 11/20/2013 0958   ALKPHOS 62 05/13/2016 0853   ALKPHOS 79 11/20/2013 0958   AST 13 05/13/2016 0853   AST 16 11/20/2013 0958   ALT 10 05/13/2016 0853   ALT 12 11/20/2013 0958   BILITOT 0.7 05/13/2016 0853   BILITOT 0.64 11/20/2013 0958       Review of Systems    Review of Systems  Constitutional: Negative for fever, appetite change, fatigue and unexpected weight change.  Eyes: Negative for pain and visual disturbance.  Respiratory: Negative for cough  and shortness of breath.   Cardiovascular: Negative for cp or palpitations    Gastrointestinal: Negative for nausea, diarrhea and constipation. pos for chronic gas and abd discomfort on and off  Genitourinary: Negative for urgency and frequency.  Skin: Negative for pallor or rash   Neurological: Negative for weakness, light-headedness, numbness and headaches.  Hematological: Negative for adenopathy. Does not bruise/bleed easily.  Psychiatric/Behavioral: Negative for dysphoric mood. The patient is not nervous/anxious.      Objective:   Physical Exam  Constitutional: She appears well-developed and well-nourished. No distress.  Well appearing elderly female   HENT:  Head: Normocephalic and atraumatic.  Mouth/Throat: Oropharynx is clear and moist.  Eyes: Conjunctivae and EOM are normal. Pupils are equal, round, and reactive to light. No scleral icterus.  Neck: Normal range of motion. Neck supple. No JVD present. Carotid  bruit is not present. No thyromegaly present.  Cardiovascular: Normal rate, regular rhythm, normal heart sounds and intact distal pulses.  Exam reveals no gallop.   Pulmonary/Chest: Effort normal and breath sounds normal. No respiratory distress. She has no wheezes. She has no rales.  No crackles  Abdominal: Soft. Bowel sounds are normal. She exhibits no distension, no abdominal bruit and no mass. There is no tenderness.  Musculoskeletal: She exhibits no edema.  Mild kyphosis   Lymphadenopathy:    She has no cervical adenopathy.  Neurological: She is alert. She has normal reflexes. No cranial nerve deficit. She exhibits normal muscle tone. Coordination normal.  No neuro deficits today  Skin: Skin is warm and dry. No rash noted. No pallor.  Psychiatric: She has a normal mood and affect.  Hard of hearing but mentally sharp          Assessment & Plan:   Problem List Items Addressed This Visit      Cardiovascular and Mediastinum   Transient ischemic attack    So far neg w/u neuro and cardiac monitor On plavix bp is well controlled Cholesterol well controlled  Rev imaging No further symptoms         Endocrine   Diabetes type 2, controlled ([place]) - Primary    Fairly stable with metformin and diet Lab Results  Component Value Date   HGBA1C 6.9 (H) 05/13/2016   Watching for hypoglycemia  F/u 6 mo  She is utd eye exam        Other   HYPERCHOLESTEROLEMIA, PURE    Disc goals for lipids and reasons to control them Rev labs with pt Rev low sat fat diet in detail HDL is up  Improved  Statin and diet  Continue to follow  Pt has hx of TIAs

## 2016-05-20 NOTE — Progress Notes (Signed)
Pre visit review using our clinic review tool, if applicable. No additional management support is needed unless otherwise documented below in the visit note. 

## 2016-06-13 ENCOUNTER — Emergency Department
Admission: EM | Admit: 2016-06-13 | Discharge: 2016-06-13 | Disposition: A | Discharge status: Home / Self Care | Payer: Medicare Other | Attending: Student in an Organized Health Care Education/Training Program | Admitting: Student in an Organized Health Care Education/Training Program

## 2016-06-13 ENCOUNTER — Emergency Department: Payer: Medicare Other

## 2016-06-13 ENCOUNTER — Encounter: Payer: Self-pay | Admitting: Emergency Medicine

## 2016-06-13 DIAGNOSIS — Z79899 Other long term (current) drug therapy: Secondary | ICD-10-CM | POA: Insufficient documentation

## 2016-06-13 DIAGNOSIS — Z7984 Long term (current) use of oral hypoglycemic drugs: Secondary | ICD-10-CM | POA: Diagnosis not present

## 2016-06-13 DIAGNOSIS — E119 Type 2 diabetes mellitus without complications: Secondary | ICD-10-CM | POA: Insufficient documentation

## 2016-06-13 DIAGNOSIS — Z853 Personal history of malignant neoplasm of breast: Secondary | ICD-10-CM | POA: Diagnosis not present

## 2016-06-13 DIAGNOSIS — B0231 Zoster conjunctivitis: Secondary | ICD-10-CM | POA: Insufficient documentation

## 2016-06-13 DIAGNOSIS — L0100 Impetigo, unspecified: Secondary | ICD-10-CM | POA: Diagnosis not present

## 2016-06-13 DIAGNOSIS — Y939 Activity, unspecified: Secondary | ICD-10-CM | POA: Diagnosis not present

## 2016-06-13 DIAGNOSIS — S0990XA Unspecified injury of head, initial encounter: Secondary | ICD-10-CM | POA: Diagnosis not present

## 2016-06-13 DIAGNOSIS — R51 Headache: Secondary | ICD-10-CM | POA: Insufficient documentation

## 2016-06-13 DIAGNOSIS — M25552 Pain in left hip: Secondary | ICD-10-CM | POA: Diagnosis not present

## 2016-06-13 DIAGNOSIS — W1839XA Other fall on same level, initial encounter: Secondary | ICD-10-CM | POA: Diagnosis not present

## 2016-06-13 DIAGNOSIS — Y999 Unspecified external cause status: Secondary | ICD-10-CM | POA: Insufficient documentation

## 2016-06-13 DIAGNOSIS — Y929 Unspecified place or not applicable: Secondary | ICD-10-CM | POA: Diagnosis not present

## 2016-06-13 DIAGNOSIS — S199XXA Unspecified injury of neck, initial encounter: Secondary | ICD-10-CM | POA: Diagnosis not present

## 2016-06-13 DIAGNOSIS — R519 Headache, unspecified: Secondary | ICD-10-CM

## 2016-06-13 DIAGNOSIS — M542 Cervicalgia: Secondary | ICD-10-CM | POA: Diagnosis not present

## 2016-06-13 LAB — BASIC METABOLIC PANEL
Anion gap: 10 (ref 5–15)
BUN: 14 mg/dL (ref 6–20)
CO2: 24 mmol/L (ref 22–32)
CREATININE: 0.79 mg/dL (ref 0.44–1.00)
Calcium: 9.5 mg/dL (ref 8.9–10.3)
Chloride: 104 mmol/L (ref 101–111)
GFR calc Af Amer: >60 mL/min (ref >60)
GFR calc non Af Amer: >60 mL/min (ref >60)
Glucose, Bld: 157 mg/dL — ABNORMAL HIGH (ref 65–99)
Potassium: 3.8 mmol/L (ref 3.5–5.1)
SODIUM: 138 mmol/L (ref 135–145)

## 2016-06-13 LAB — CBC
HCT: 43.9 % (ref 35.0–47.0)
Hemoglobin: 14.7 g/dL (ref 12.0–16.0)
MCH: 31.5 pg (ref 26.0–34.0)
MCHC: 33.6 g/dL (ref 32.0–36.0)
MCV: 93.7 fL (ref 80.0–100.0)
PLATELETS: 180 10*3/uL (ref 150–440)
RBC: 4.68 MIL/uL (ref 3.80–5.20)
RDW: 14.6 % — AB (ref 11.5–14.5)
WBC: 8.9 10*3/uL (ref 3.6–11.0)

## 2016-06-13 MED ORDER — ACETAMINOPHEN 500 MG PO TABS
1000.0000 mg | ORAL_TABLET | Freq: Once | ORAL | Status: AC
  Administered 2016-06-13: 1000 mg via ORAL
  Filled 2016-06-13: qty 2

## 2016-06-13 MED ORDER — ACYCLOVIR 800 MG PO TABS
800.0000 mg | ORAL_TABLET | Freq: Once | ORAL | Status: AC
  Administered 2016-06-13: 800 mg via ORAL
  Filled 2016-06-13: qty 1

## 2016-06-13 MED ORDER — HYPROMELLOSE (GONIOSCOPIC) 2.5 % OP SOLN: 1.0000 [drp] | Freq: Three times a day (TID) | OPHTHALMIC | 12 refills | Status: DC

## 2016-06-13 MED ORDER — ACYCLOVIR 400 MG PO TABS: 800.0000 mg | ORAL_TABLET | Freq: Every day | ORAL | 0 refills | Status: AC

## 2016-06-13 MED ORDER — HYDROCODONE-ACETAMINOPHEN 7.5-325 MG/15ML PO SOLN: 5.0000 mL | Freq: Four times a day (QID) | ORAL | 0 refills | Status: DC | PRN

## 2016-06-13 MED ORDER — CEPHALEXIN 250 MG PO CAPS: 250.0000 mg | ORAL_CAPSULE | Freq: Three times a day (TID) | ORAL | 0 refills | Status: AC

## 2016-06-13 MED ORDER — SODIUM CHLORIDE 0.9 % IV BOLUS (SEPSIS)
500.0000 mL | Freq: Once | INTRAVENOUS | Status: AC
  Administered 2016-06-13: 500 mL via INTRAVENOUS

## 2016-06-13 MED ORDER — FLUORESCEIN SODIUM 0.6 MG OP STRP
1.0000 | ORAL_STRIP | Freq: Once | OPHTHALMIC | Status: AC
  Administered 2016-06-13: 1 via OPHTHALMIC
  Filled 2016-06-13: qty 1

## 2016-06-13 NOTE — ED Provider Notes (Signed)
[facility] Emergency Department Provider Note    First MD Initiated Contact with Patient 06/13/16 1030     (approximate)  I have reviewed the triage vital signs and the nursing notes.   HISTORY  Chief Complaint Rash    HPI Lauren [name] is a 83 y.o. female concern for rash and pain in left side of her head with red blisters noted over the past several days. [place] in the [place] she had a mechanical fall on New Year's day where she did hit her left posterior head but did not have any loss of consciousness. She is on Plavix. She did not seek medical care after the fall. She has been placing icy hot to the left forehead. She's never had any reaction icy hot in the past. She's never had shingles but has had the shingles vaccine. Denies any blurry vision but has noted that her left eye is tearing.  She denies any chest pain, palpitations, shortness of breath, abdominal pain, nausea or vomiting. No difficulty ambulating.   Past Medical History:  Diagnosis Date  . Breast cancer ([place])    left breast  . Chronic constipation   . Chronic headaches   . Diabetes mellitus   . Fibrocystic breast   . Gastritis   . Gout    injection in the past  . Hyperlipidemia   . Melanoma in situ of left upper extremity ([place])   . Pelvic fracture ([place])   . TIA (transient ischemic attack) 08/2008   neg MRI/MRA and CT and carotid dopplers  . Venous insufficiency    Family History  Problem Relation Age of Onset  . Breast cancer Sister   . Stroke Sister   . Cancer Sister     breast  . Breast cancer Sister   . Stroke Sister   . Cancer Sister     breast  . Stroke Mother   . Stroke Father   . Stroke Brother   . Cancer Brother     prostate  . Stroke Brother   . Stroke Sister   . CAD Sister    Past Surgical History:  Procedure Laterality Date  . APPENDECTOMY    . BREAST CYST ASPIRATION    . BREAST LUMPECTOMY  09/2007   with sentinel LN biopsy  . BREAST LUMPECTOMY     left  . EYE SURGERY     retinal, then cataract   Patient Active Problem List   Diagnosis Date Noted  . TIA (transient ischemic attack) 02/28/2016  . GERD (gastroesophageal reflux disease) 08/06/2015  . Hearing loss 08/06/2015  . Estrogen deficiency 05/01/2014  . Left knee pain 09/22/2013  . Chest pain, musculoskeletal 08/05/2013  . Encounter for Medicare annual wellness exam 04/04/2013  . Colon cancer screening 04/04/2013  . History of melanoma excision 09/27/2012  . History of breast cancer, left 01/08/2011  . History of pelvic fracture 09/17/2010  . DEGENERATIVE JOINT DISEASE 02/13/2010  . Migraine variant 11/06/2009  . Osteopenia 10/18/2008  . Breast cancer, left breast ([place]) 09/10/2008  . Transient ischemic attack 09/10/2008  . Diabetes type 2, controlled ([place]) 10/06/2007  . VENOUS INSUFFICIENCY 10/06/2007  . Diverticulosis of colon 10/06/2007  . FIBROCYSTIC BREAST DISEASE 10/06/2007  . HYPERCHOLESTEROLEMIA, PURE 11/29/2006      Prior to Admission medications   Medication Sig Start Date End Date Taking? Authorizing Provider  acetaminophen (TYLENOL) 325 MG tablet Take 2 tablets (650 mg total) by mouth every 6 (six) hours as needed for mild  pain (or Fever >/= 101). 03/02/16   Eber Jones, MD  acyclovir (ZOVIRAX) 400 MG tablet Take 2 tablets (800 mg total) by mouth 5 (five) times daily. 06/13/16 06/20/16  Merlyn Lot, MD  calcium carbonate (TUMS - DOSED IN MG ELEMENTAL CALCIUM) 500 MG chewable tablet Chew 1 tablet by mouth daily.    Historical Provider, MD  Calcium-Vitamin D 600-200 MG-UNIT per tablet Take 1 tablet by mouth 2 (two) times daily. Reported on 08/02/2015    Historical Provider, MD  cephALEXin (KEFLEX) 250 MG capsule Take 1 capsule (250 mg total) by mouth 3 (three) times daily. 06/13/16 06/20/16  Merlyn Lot, MD  citalopram (CELEXA) 10 MG tablet Take 0.5 tablets (5 mg total) by mouth daily. 08/06/15   Abner Greenspan, MD  clopidogrel (PLAVIX) 75 MG tablet  TAKE ONE TABLET BY MOUTH EACH MORNING WITH BREAKFAST 08/02/15   Abner Greenspan, MD  fish oil-omega-3 fatty acids 1000 MG capsule Take 1 g by mouth daily.      Historical Provider, MD  glucose blood (ONE TOUCH ULTRA TEST) test strip USE AS DIRECTED TO CHECK BLOOD SUGAR ONCE A DAY AND AS NEEDED (DX. E11.9) 03/14/15   Abner Greenspan, MD  HYDROcodone-acetaminophen (HYCET) 7.5-325 mg/15 ml solution Take 5 mLs by mouth every 6 (six) hours as needed for severe pain. 06/13/16 06/13/17  Merlyn Lot, MD  hydroxypropyl methylcellulose / hypromellose (ISOPTO TEARS / GONIOVISC) 2.5 % ophthalmic solution Place 1 drop into the left eye 3 (three) times daily. 06/13/16   Merlyn Lot, MD  metFORMIN (GLUCOPHAGE) 500 MG tablet Take 0.5 tablets (250 mg total) by mouth 2 (two) times daily with a meal. 11/27/15   Abner Greenspan, MD  Multiple Vitamins-Minerals (PRESERVISION AREDS 2 PO) Take 1 tablet by mouth 2 (two) times daily.    Historical Provider, MD  NEXIUM 40 MG capsule Take 1 capsule (40 mg total) by mouth daily. Name brand please 11/12/15   Abner Greenspan, MD  polyethylene glycol powder (GLYCOLAX/MIRALAX) powder DISSOLVE ONE (1) CAPFUL (17GM) INTO 4 TO8 OUNCES OF FLUID AND TAKE BYMOUTH ONCE DAILY AS DIRECTED 08/02/15   Abner Greenspan, MD  ranitidine (ZANTAC) 300 MG tablet Take 1 tablet (300 mg total) by mouth daily as needed for heartburn. 11/12/15   Abner Greenspan, MD  simethicone (MYLICON) 80 MG chewable tablet Chew 80 mg by mouth every 6 (six) hours as needed for flatulence.    Historical Provider, MD  simvastatin (ZOCOR) 20 MG tablet TAKE ONE TABLET BY MOUTH EVERY NIGHT AT BEDTIME 02/12/16   Abner Greenspan, MD    Allergies Sulfonamide derivatives and Aspirin    Social History Social History  Substance Use Topics  . Smoking status: Never Smoker  . Smokeless tobacco: Never Used  . Alcohol use No    Review of Systems Patient denies headaches, rhinorrhea, blurry vision, numbness, shortness of breath, chest  pain, edema, cough, abdominal pain, nausea, vomiting, diarrhea, dysuria, fevers, rashes or hallucinations unless otherwise stated above in HPI. ____________________________________________   PHYSICAL EXAM:  VITAL SIGNS: Vitals:   06/13/16 1430 06/13/16 1439  BP: 138/72 (!) 155/77  Pulse:  72  Resp:  18  Temp:      Constitutional: Alert and oriented. Well appearing and in no acute distress. Eye left conjunctival injection with clear tearing, EOMI intact. No vesicular or dendritic lesions to globe Head: erythema and what appear to be previously ruptured vesicles on left face involving V1-3 distribution. Nose: No congestion/rhinnorhea. Mouth/Throat:  Mucous membranes are moist.  Oropharynx non-erythematous. Neck: No stridor. Painless ROM. No cervical spine tenderness to palpation Hematological/Lymphatic/Immunilogical: No cervical lymphadenopathy. Cardiovascular: Normal rate, regular rhythm. Grossly normal heart sounds.  Good peripheral circulation. Respiratory: Normal respiratory effort.  No retractions. Lungs CTAB. Gastrointestinal: Soft and nontender. No distention. No abdominal bruits. No CVA tenderness. Musculoskeletal: No lower extremity tenderness nor edema.  No joint effusions. Neurologic:  Normal speech and language. No gross focal neurologic deficits are appreciated. No gait instability. Skin:  Skin is warm, dry and intact. No rash noted. Psychiatric: Mood and affect are normal. Speech and behavior are normal.  ____________________________________________   LABS (all labs ordered are listed, but only abnormal results are displayed)  Results for orders placed or performed during the hospital encounter of 06/13/16 (from the past 24 hour(s))  CBC     Status: Abnormal   Collection Time: 06/13/16  1:11 PM  Result Value Ref Range   WBC 8.9 3.6 - 11.0 K/uL   RBC 4.68 3.80 - 5.20 MIL/uL   Hemoglobin 14.7 12.0 - 16.0 g/dL   HCT 43.9 35.0 - 47.0 %   MCV 93.7 80.0 - 100.0 fL    MCH 31.5 26.0 - 34.0 pg   MCHC 33.6 32.0 - 36.0 g/dL   RDW 14.6 (H) 11.5 - 14.5 %   Platelets 180 150 - 440 K/uL  Basic metabolic panel     Status: Abnormal   Collection Time: 06/13/16  1:11 PM  Result Value Ref Range   Sodium 138 135 - 145 mmol/L   Potassium 3.8 3.5 - 5.1 mmol/L   Chloride 104 101 - 111 mmol/L   CO2 24 22 - 32 mmol/L   Glucose, Bld 157 (H) 65 - 99 mg/dL   BUN 14 6 - 20 mg/dL   Creatinine, Ser 0.79 0.44 - 1.00 mg/dL   Calcium 9.5 8.9 - 10.3 mg/dL   GFR calc non Af Amer >60 >60 mL/min   GFR calc Af Amer >60 >60 mL/min   Anion gap 10 5 - 15   ____________________________________________   ____________________________________________  RADIOLOGY  I personally reviewed all radiographic images ordered to evaluate for the above acute complaints and reviewed radiology reports and findings.  These findings were personally discussed with the patient.  Please see medical record for radiology report.  ____________________________________________   PROCEDURES  Procedure(s) performed:  Procedures    Critical Care performed: no ____________________________________________   INITIAL IMPRESSION / ASSESSMENT AND PLAN / ED COURSE  Pertinent labs & imaging results that were available during my care of the patient were reviewed by me and considered in my medical decision making (see chart for details).  DDX: shingles, dermatitis, sdh, ecchymosis, iph, fracture, cellulitis  INDRIA DOOD is a 83 y.o. who presents to the ED with rash left side of the face is dermatomal status post fall over 2 and half weeks ago on Plavix. Patient is afebrile, mildly tachycardic but well-perfused and in no acute distress. Atypical presentation of but it does appear consistent with some component of shingles, possibly also infected.  Ophthalmic evaluation shows no evidence of dendritic lesions suggests ophthalmic zoster. We'll order CT scan to evaluate for acute medical injury as the  patient is on Plavix and had fall.  The patient will be placed on continuous pulse oximetry and telemetry for monitoring.  Laboratory evaluation will be sent to evaluate for the above complaints.     Clinical Course as of Jun 14 801  Sat Jun 13, 2016  1324 CT head with NAICA.  No evidence of femur fracture.    [PR]  1359 RDW: (!) 14.6 [PR]  1412 Spoke with Dr. Edison Pace of ophthalmology who agrees with plan for systemic anti-vitals, topical artificial tears and close follow up in his clinic early next week. Blood work is otherwise reassuring the patient does not have any evidence of dehydration were systemic infection. I will provide in addition to acyclovir oral Keflex as I am concerned that there is some superimposed bacterial infection concerning for early impetigo.  [PR]    Clinical Course User Index [PR] Merlyn Lot, MD     ____________________________________________   FINAL CLINICAL IMPRESSION(S) / ED DIAGNOSES  Final diagnoses:  Herpes zoster conjunctivitis  Acute nonintractable headache, unspecified headache type  Impetigo      NEW MEDICATIONS STARTED DURING THIS VISIT:  Discharge Medication List as of 06/13/2016  2:28 PM    START taking these medications   Details  acyclovir (ZOVIRAX) 400 MG tablet Take 2 tablets (800 mg total) by mouth 5 (five) times daily., Starting Sat 06/13/2016, Until Sat 06/20/2016, Print    cephALEXin (KEFLEX) 250 MG capsule Take 1 capsule (250 mg total) by mouth 3 (three) times daily., Starting Sat 06/13/2016, Until Sat 06/20/2016, Print    HYDROcodone-acetaminophen (HYCET) 7.5-325 mg/15 ml solution Take 5 mLs by mouth every 6 (six) hours as needed for severe pain., Starting Sat 06/13/2016, Until Sun 06/13/2017, Print    hydroxypropyl methylcellulose / hypromellose (ISOPTO TEARS / GONIOVISC) 2.5 % ophthalmic solution Place 1 drop into the left eye 3 (three) times daily., Starting Sat 06/13/2016, Print         Note:  This document was prepared  using Dragon voice recognition software and may include unintentional dictation errors.    Merlyn Lot, MD 06/14/16 [phone]

## 2016-06-13 NOTE — ED Notes (Signed)
Patient left for imaging. 

## 2016-06-13 NOTE — ED Notes (Signed)
Pt reports previous fall was a trip.  Today appears to have strong steady gait

## 2016-06-13 NOTE — Discharge Instructions (Signed)
Please return for any worsening of symptoms, eye pain, fever or difficulty tolerating medications.

## 2016-06-13 NOTE — ED Triage Notes (Signed)
Reports fall on new years day, states she started having left side head pain a few days ago.  Red blisters noted to left face and head.  Mask applied.

## 2016-06-13 NOTE — ED Notes (Signed)
Pharmacy called for acyclovir.

## 2016-06-15 ENCOUNTER — Other Ambulatory Visit: Payer: Self-pay | Admitting: Family Medicine

## 2016-06-15 DIAGNOSIS — B0230 Zoster ocular disease, unspecified: Secondary | ICD-10-CM | POA: Diagnosis not present

## 2016-06-22 DIAGNOSIS — H353122 Nonexudative age-related macular degeneration, left eye, intermediate dry stage: Secondary | ICD-10-CM | POA: Diagnosis not present

## 2016-06-22 DIAGNOSIS — H52203 Unspecified astigmatism, bilateral: Secondary | ICD-10-CM | POA: Diagnosis not present

## 2016-06-22 DIAGNOSIS — E113293 Type 2 diabetes mellitus with mild nonproliferative diabetic retinopathy without macular edema, bilateral: Secondary | ICD-10-CM | POA: Diagnosis not present

## 2016-06-22 DIAGNOSIS — D2311 Other benign neoplasm of skin of right eyelid, including canthus: Secondary | ICD-10-CM | POA: Diagnosis not present

## 2016-06-22 LAB — HM DIABETES EYE EXAM

## 2016-06-23 ENCOUNTER — Telehealth: Payer: Self-pay

## 2016-06-23 NOTE — Telephone Encounter (Signed)
Amy at [facility] left v/m that previously pt preferred brand name for  Nexium but this year ins will not cover brand name and pt is OK getting generic Nexium if that is OK with Dr Glori Bickers.Please advise. Amy said if OK she can make notation on rx already has for Nexium.

## 2016-06-23 NOTE — Telephone Encounter (Signed)
Left message letting Amy know it's ok to give generic

## 2016-06-23 NOTE — Telephone Encounter (Signed)
That is fine with me, thanks

## 2016-07-08 ENCOUNTER — Telehealth: Payer: Self-pay

## 2016-07-08 MED ORDER — HYDROXYZINE HCL 10 MG PO TABS: 10.0000 mg | ORAL_TABLET | Freq: Every evening | ORAL | 1 refills | Status: DC | PRN

## 2016-07-08 NOTE — Telephone Encounter (Signed)
We could try some hydroxyzine at bedtime -this may help more than benadryl but is quite sedating - watch closely for sedation/falls/ or mental status changes I will send it to [facility]  Also -try a cold compress on itchy area whenever you can -this helps itch

## 2016-07-08 NOTE — Telephone Encounter (Signed)
Linda notified Rx sent to pharmacy and advise of Dr. Marliss Coots instructions and warnings and verbalized understanding

## 2016-07-08 NOTE — Telephone Encounter (Signed)
Lauren [name] left v/m; pt had shingles which have almost completely cleared except for few scab like areas on scalp. Pt is having constant itching on her head; pt has tried various shampoos and has taken 2 benadry at night and that has helped but pt itches constantly and is worse at night so pt cannot sleep. Linda request cb.

## 2016-07-17 ENCOUNTER — Ambulatory Visit (INDEPENDENT_AMBULATORY_CARE_PROVIDER_SITE_OTHER): Payer: Medicare Other | Admitting: Family Medicine

## 2016-07-17 ENCOUNTER — Encounter: Payer: Self-pay | Admitting: Family Medicine

## 2016-07-17 ENCOUNTER — Encounter (INDEPENDENT_AMBULATORY_CARE_PROVIDER_SITE_OTHER): Payer: Self-pay

## 2016-07-17 VITALS — BP 108/70 | HR 82 | Temp 99.3°F | Ht 62.5 in | Wt 120.5 lb

## 2016-07-17 DIAGNOSIS — R509 Fever, unspecified: Secondary | ICD-10-CM | POA: Diagnosis not present

## 2016-07-17 DIAGNOSIS — B9789 Other viral agents as the cause of diseases classified elsewhere: Secondary | ICD-10-CM | POA: Diagnosis not present

## 2016-07-17 DIAGNOSIS — J069 Acute upper respiratory infection, unspecified: Secondary | ICD-10-CM

## 2016-07-17 LAB — POC INFLUENZA A&B (BINAX/QUICKVUE)
INFLUENZA B, POC: NEGATIVE
Influenza A, POC: NEGATIVE

## 2016-07-17 NOTE — Patient Instructions (Addendum)
Rest, fluids.  Can use mucinex DM for cough as needed.  Use zyrtec at bedtime for allergies.  Add nasal steroid like flonase for congestion and allergies.  Call if shortness of breath or worsening symtpoms.

## 2016-07-17 NOTE — Assessment & Plan Note (Addendum)
Neg flu test.  Possible allergy component. Symptomatic care.  Can try zyrtec. flonase saline and mucinex DM prn.

## 2016-07-17 NOTE — Progress Notes (Signed)
   Subjective:    Patient ID: Lauren [name], female    DOB: 12-15-1927, 83 y.o.   MRN: [id number]  Fever   This is a new problem. The current episode started yesterday. The problem has been gradually worsening. The maximum temperature noted was 100 to 100.9 F. Associated symptoms include coughing. Pertinent negatives include no ear pain or wheezing. Associated symptoms comments: Runny nose.  Cough  The problem has been gradually worsening. The cough is non-productive. Associated symptoms include a fever, rhinorrhea and shortness of breath. Pertinent negatives include no ear congestion, ear pain, myalgias or wheezing. Associated symptoms comments: No sinus pain. Risk factors: nonsmoker. Treatments tried: benadryl. Her past medical history is significant for environmental allergies and pneumonia. There is no history of asthma or COPD.   Did spend time outside.   Sick contacts: none  Review of Systems  Constitutional: Positive for fever.  HENT: Positive for rhinorrhea. Negative for ear pain.   Respiratory: Positive for cough and shortness of breath. Negative for wheezing.   Musculoskeletal: Negative for myalgias.  Allergic/Immunologic: Positive for environmental allergies.   Blood pressure 108/70, pulse 82, temperature 99.3 F (37.4 C), temperature source Oral, height 5' 2.5" (1.588 m), weight 120 lb 8 oz (54.7 kg), SpO2 96 %.     Objective:   Physical Exam  Constitutional: Vital signs are normal. She appears well-developed and well-nourished. She is cooperative.  Non-toxic appearance. She does not appear ill. No distress.  HENT:  Head: Normocephalic.  Right Ear: Hearing, tympanic membrane, external ear and ear canal normal. Tympanic membrane is not erythematous, not retracted and not bulging. No middle ear effusion.  Left Ear: Hearing, tympanic membrane, external ear and ear canal normal. Tympanic membrane is not erythematous, not retracted and not bulging.  No middle ear effusion.    Nose: Mucosal edema and rhinorrhea present. Right sinus exhibits no maxillary sinus tenderness and no frontal sinus tenderness. Left sinus exhibits no maxillary sinus tenderness and no frontal sinus tenderness.  Mouth/Throat: Uvula is midline and mucous membranes are normal. Posterior oropharyngeal erythema present. No posterior oropharyngeal edema.  Eyes: Conjunctivae, EOM and lids are normal. Pupils are equal, round, and reactive to light. Lids are everted and swept, no foreign bodies found.  Neck: Trachea normal and normal range of motion. Neck supple. Carotid bruit is not present. No thyroid mass and no thyromegaly present.  Cardiovascular: Normal rate, regular rhythm, S1 normal, S2 normal, normal heart sounds, intact distal pulses and normal pulses.  Exam reveals no gallop and no friction rub.   No murmur heard. Pulmonary/Chest: Effort normal and breath sounds normal. No tachypnea. No respiratory distress. She has no decreased breath sounds. She has no wheezes. She has no rhonchi. She has no rales.  Neurological: She is alert.  Skin: Skin is warm, dry and intact. No rash noted.  Psychiatric: Her speech is normal and behavior is normal. Judgment normal. Her mood appears not anxious. Cognition and memory are normal. She does not exhibit a depressed mood.          Assessment & Plan:

## 2016-07-17 NOTE — Progress Notes (Signed)
Pre visit review using our clinic review tool, if applicable. No additional management support is needed unless otherwise documented below in the visit note. 

## 2016-07-20 ENCOUNTER — Ambulatory Visit: Payer: Medicare Other | Admitting: Family Medicine

## 2016-08-15 ENCOUNTER — Inpatient Hospital Stay (HOSPITAL_COMMUNITY)
Admission: EM | Admit: 2016-08-15 | Discharge: 2016-08-18 | DRG: 065 | Disposition: A | Discharge status: Skilled Nursing Facility | Payer: Medicare Other | Attending: Internal Medicine | Admitting: Internal Medicine

## 2016-08-15 ENCOUNTER — Encounter (HOSPITAL_COMMUNITY): Payer: Self-pay | Admitting: Emergency Medicine

## 2016-08-15 ENCOUNTER — Emergency Department (HOSPITAL_COMMUNITY): Payer: Medicare Other

## 2016-08-15 ENCOUNTER — Inpatient Hospital Stay (HOSPITAL_COMMUNITY): Payer: Medicare Other

## 2016-08-15 DIAGNOSIS — R5383 Other fatigue: Secondary | ICD-10-CM | POA: Diagnosis not present

## 2016-08-15 DIAGNOSIS — G464 Cerebellar stroke syndrome: Secondary | ICD-10-CM | POA: Diagnosis not present

## 2016-08-15 DIAGNOSIS — F411 Generalized anxiety disorder: Secondary | ICD-10-CM | POA: Diagnosis not present

## 2016-08-15 DIAGNOSIS — I63521 Cerebral infarction due to unspecified occlusion or stenosis of right anterior cerebral artery: Principal | ICD-10-CM | POA: Diagnosis present

## 2016-08-15 DIAGNOSIS — Z7984 Long term (current) use of oral hypoglycemic drugs: Secondary | ICD-10-CM | POA: Diagnosis not present

## 2016-08-15 DIAGNOSIS — Z823 Family history of stroke: Secondary | ICD-10-CM

## 2016-08-15 DIAGNOSIS — Z66 Do not resuscitate: Secondary | ICD-10-CM | POA: Diagnosis present

## 2016-08-15 DIAGNOSIS — Z803 Family history of malignant neoplasm of breast: Secondary | ICD-10-CM

## 2016-08-15 DIAGNOSIS — F419 Anxiety disorder, unspecified: Secondary | ICD-10-CM | POA: Diagnosis present

## 2016-08-15 DIAGNOSIS — Z853 Personal history of malignant neoplasm of breast: Secondary | ICD-10-CM

## 2016-08-15 DIAGNOSIS — E1142 Type 2 diabetes mellitus with diabetic polyneuropathy: Secondary | ICD-10-CM

## 2016-08-15 DIAGNOSIS — R490 Dysphonia: Secondary | ICD-10-CM

## 2016-08-15 DIAGNOSIS — G8194 Hemiplegia, unspecified affecting left nondominant side: Secondary | ICD-10-CM | POA: Diagnosis present

## 2016-08-15 DIAGNOSIS — G451 Carotid artery syndrome (hemispheric): Secondary | ICD-10-CM | POA: Diagnosis not present

## 2016-08-15 DIAGNOSIS — M6281 Muscle weakness (generalized): Secondary | ICD-10-CM | POA: Diagnosis not present

## 2016-08-15 DIAGNOSIS — K219 Gastro-esophageal reflux disease without esophagitis: Secondary | ICD-10-CM | POA: Diagnosis present

## 2016-08-15 DIAGNOSIS — E119 Type 2 diabetes mellitus without complications: Secondary | ICD-10-CM | POA: Diagnosis present

## 2016-08-15 DIAGNOSIS — R2681 Unsteadiness on feet: Secondary | ICD-10-CM | POA: Diagnosis not present

## 2016-08-15 DIAGNOSIS — Z85820 Personal history of malignant melanoma of skin: Secondary | ICD-10-CM | POA: Diagnosis not present

## 2016-08-15 DIAGNOSIS — R269 Unspecified abnormalities of gait and mobility: Secondary | ICD-10-CM | POA: Diagnosis not present

## 2016-08-15 DIAGNOSIS — R531 Weakness: Secondary | ICD-10-CM | POA: Diagnosis not present

## 2016-08-15 DIAGNOSIS — F329 Major depressive disorder, single episode, unspecified: Secondary | ICD-10-CM | POA: Diagnosis present

## 2016-08-15 DIAGNOSIS — E7800 Pure hypercholesterolemia, unspecified: Secondary | ICD-10-CM | POA: Diagnosis not present

## 2016-08-15 DIAGNOSIS — Z8249 Family history of ischemic heart disease and other diseases of the circulatory system: Secondary | ICD-10-CM

## 2016-08-15 DIAGNOSIS — I63321 Cerebral infarction due to thrombosis of right anterior cerebral artery: Secondary | ICD-10-CM | POA: Diagnosis not present

## 2016-08-15 DIAGNOSIS — R1311 Dysphagia, oral phase: Secondary | ICD-10-CM | POA: Diagnosis not present

## 2016-08-15 DIAGNOSIS — E785 Hyperlipidemia, unspecified: Secondary | ICD-10-CM | POA: Diagnosis present

## 2016-08-15 DIAGNOSIS — I10 Essential (primary) hypertension: Secondary | ICD-10-CM | POA: Diagnosis not present

## 2016-08-15 DIAGNOSIS — E1169 Type 2 diabetes mellitus with other specified complication: Secondary | ICD-10-CM | POA: Diagnosis present

## 2016-08-15 DIAGNOSIS — E11319 Type 2 diabetes mellitus with unspecified diabetic retinopathy without macular edema: Secondary | ICD-10-CM | POA: Diagnosis not present

## 2016-08-15 DIAGNOSIS — R29810 Facial weakness: Secondary | ICD-10-CM | POA: Diagnosis present

## 2016-08-15 DIAGNOSIS — R41841 Cognitive communication deficit: Secondary | ICD-10-CM | POA: Diagnosis not present

## 2016-08-15 DIAGNOSIS — E1159 Type 2 diabetes mellitus with other circulatory complications: Secondary | ICD-10-CM | POA: Diagnosis not present

## 2016-08-15 DIAGNOSIS — I6789 Other cerebrovascular disease: Secondary | ICD-10-CM | POA: Diagnosis not present

## 2016-08-15 DIAGNOSIS — I639 Cerebral infarction, unspecified: Secondary | ICD-10-CM

## 2016-08-15 DIAGNOSIS — R29898 Other symptoms and signs involving the musculoskeletal system: Secondary | ICD-10-CM | POA: Diagnosis not present

## 2016-08-15 DIAGNOSIS — Z8673 Personal history of transient ischemic attack (TIA), and cerebral infarction without residual deficits: Secondary | ICD-10-CM | POA: Diagnosis present

## 2016-08-15 DIAGNOSIS — I6350 Cerebral infarction due to unspecified occlusion or stenosis of unspecified cerebral artery: Secondary | ICD-10-CM | POA: Diagnosis not present

## 2016-08-15 DIAGNOSIS — R278 Other lack of coordination: Secondary | ICD-10-CM | POA: Diagnosis not present

## 2016-08-15 LAB — CBC
HCT: 42.4 % (ref 36.0–46.0)
HEMATOCRIT: 40.4 % (ref 36.0–46.0)
HEMOGLOBIN: 13.9 g/dL (ref 12.0–15.0)
Hemoglobin: 13.3 g/dL (ref 12.0–15.0)
MCH: 31.4 pg (ref 26.0–34.0)
MCH: 31.6 pg (ref 26.0–34.0)
MCHC: 32.8 g/dL (ref 30.0–36.0)
MCHC: 32.9 g/dL (ref 30.0–36.0)
MCV: 95.9 fL (ref 78.0–100.0)
MCV: 96 fL (ref 78.0–100.0)
Platelets: 188 10*3/uL (ref 150–400)
Platelets: 219 10*3/uL (ref 150–400)
RBC: 4.42 MIL/uL (ref 3.87–5.11)
RBC: 4.21 MIL/uL (ref 3.87–5.11)
RDW: 14.1 % (ref 11.5–15.5)
RDW: 14.5 % (ref 11.5–15.5)
WBC: 9.8 10*3/uL (ref 4.0–10.5)
WBC: 11.5 10*3/uL — AB (ref 4.0–10.5)

## 2016-08-15 LAB — I-STAT TROPONIN, ED: TROPONIN I, POC: 0 ng/mL (ref 0.00–0.08)

## 2016-08-15 LAB — DIFFERENTIAL
BASOS PCT: 1 %
Basophils Absolute: 0.1 10*3/uL (ref 0.0–0.1)
EOS ABS: 0.3 10*3/uL (ref 0.0–0.7)
EOS PCT: 3 %
LYMPHS ABS: 1.9 10*3/uL (ref 0.7–4.0)
Lymphocytes Relative: 19 %
MONOS PCT: 6 %
Monocytes Absolute: 0.6 10*3/uL (ref 0.1–1.0)
NEUTROS PCT: 71 %
Neutro Abs: 7 10*3/uL (ref 1.7–7.7)

## 2016-08-15 LAB — COMPREHENSIVE METABOLIC PANEL
ALT: 13 U/L — ABNORMAL LOW (ref 14–54)
ANION GAP: 9 (ref 5–15)
AST: 22 U/L (ref 15–41)
Albumin: 3.5 g/dL (ref 3.5–5.0)
Alkaline Phosphatase: 60 U/L (ref 38–126)
BILIRUBIN TOTAL: 0.5 mg/dL (ref 0.3–1.2)
BUN: 14 mg/dL (ref 6–20)
CHLORIDE: 106 mmol/L (ref 101–111)
CO2: 24 mmol/L (ref 22–32)
Calcium: 9.8 mg/dL (ref 8.9–10.3)
Creatinine, Ser: 0.76 mg/dL (ref 0.44–1.00)
GFR calc Af Amer: >60 mL/min (ref >60)
GFR calc non Af Amer: >60 mL/min (ref >60)
Glucose, Bld: 182 mg/dL — ABNORMAL HIGH (ref 65–99)
POTASSIUM: 4.3 mmol/L (ref 3.5–5.1)
Sodium: 139 mmol/L (ref 135–145)
Total Protein: 6.6 g/dL (ref 6.5–8.1)

## 2016-08-15 LAB — URINALYSIS, ROUTINE W REFLEX MICROSCOPIC
BILIRUBIN URINE: NEGATIVE
Glucose, UA: NEGATIVE mg/dL
Hgb urine dipstick: NEGATIVE
KETONES UR: NEGATIVE mg/dL
LEUKOCYTES UA: NEGATIVE
NITRITE: NEGATIVE
PROTEIN: NEGATIVE mg/dL
Specific Gravity, Urine: 1.008 (ref 1.005–1.030)
pH: 6 (ref 5.0–8.0)

## 2016-08-15 LAB — RAPID URINE DRUG SCREEN, HOSP PERFORMED
Amphetamines: NOT DETECTED
BARBITURATES: NOT DETECTED
Benzodiazepines: NOT DETECTED
COCAINE: NOT DETECTED
OPIATES: NOT DETECTED
Tetrahydrocannabinol: NOT DETECTED

## 2016-08-15 LAB — PROTIME-INR
INR: 1.05
Prothrombin Time: 13.7 seconds (ref 11.4–15.2)

## 2016-08-15 LAB — APTT: APTT: 31 s (ref 24–36)

## 2016-08-15 LAB — GLUCOSE, CAPILLARY: GLUCOSE-CAPILLARY: 286 mg/dL — AB (ref 65–99)

## 2016-08-15 LAB — TSH: TSH: 3.93 u[IU]/mL (ref 0.350–4.500)

## 2016-08-15 LAB — CREATININE, SERUM: Creatinine, Ser: 0.73 mg/dL (ref 0.44–1.00)

## 2016-08-15 LAB — CBG MONITORING, ED: GLUCOSE-CAPILLARY: 102 mg/dL — AB (ref 65–99)

## 2016-08-15 MED ORDER — HEPARIN SODIUM (PORCINE) 5000 UNIT/ML IJ SOLN
5000.0000 [IU] | Freq: Two times a day (BID) | INTRAMUSCULAR | Status: DC
  Administered 2016-08-15 – 2016-08-18 (×6): 5000 [IU] via SUBCUTANEOUS
  Filled 2016-08-15 (×6): qty 1

## 2016-08-15 MED ORDER — PANTOPRAZOLE SODIUM 40 MG PO TBEC
40.0000 mg | DELAYED_RELEASE_TABLET | Freq: Every day | ORAL | Status: DC
  Administered 2016-08-16 – 2016-08-18 (×3): 40 mg via ORAL
  Filled 2016-08-15 (×3): qty 1

## 2016-08-15 MED ORDER — OMEGA-3 FATTY ACIDS 1000 MG PO CAPS
1.0000 g | ORAL_CAPSULE | Freq: Every day | ORAL | Status: DC
  Administered 2016-08-15: 1 g via ORAL
  Filled 2016-08-15 (×2): qty 1

## 2016-08-15 MED ORDER — SIMVASTATIN 20 MG PO TABS
20.0000 mg | ORAL_TABLET | Freq: Every day | ORAL | Status: DC
  Administered 2016-08-15 – 2016-08-17 (×3): 20 mg via ORAL
  Filled 2016-08-15 (×3): qty 1

## 2016-08-15 MED ORDER — SIMETHICONE 80 MG PO CHEW: 80.0000 mg | CHEWABLE_TABLET | Freq: Four times a day (QID) | ORAL | Status: DC | PRN

## 2016-08-15 MED ORDER — HEPARIN SODIUM (PORCINE) 5000 UNIT/ML IJ SOLN: 5000.0000 [IU] | Freq: Three times a day (TID) | INTRAMUSCULAR | Status: DC

## 2016-08-15 MED ORDER — ACETAMINOPHEN 650 MG RE SUPP: 650.0000 mg | RECTAL | Status: DC | PRN

## 2016-08-15 MED ORDER — INSULIN ASPART 100 UNIT/ML ~~LOC~~ SOLN
0.0000 [IU] | Freq: Three times a day (TID) | SUBCUTANEOUS | Status: DC
  Administered 2016-08-15: 5 [IU] via SUBCUTANEOUS
  Administered 2016-08-17 – 2016-08-18 (×2): 1 [IU] via SUBCUTANEOUS

## 2016-08-15 MED ORDER — ACETAMINOPHEN 325 MG PO TABS: 650.0000 mg | ORAL_TABLET | ORAL | Status: DC | PRN

## 2016-08-15 MED ORDER — ACETAMINOPHEN 160 MG/5ML PO SOLN: 650.0000 mg | ORAL | Status: DC | PRN

## 2016-08-15 MED ORDER — CALCIUM CARBONATE ANTACID 500 MG PO CHEW: 1.0000 | CHEWABLE_TABLET | ORAL | Status: DC | PRN

## 2016-08-15 MED ORDER — STROKE: EARLY STAGES OF RECOVERY BOOK
Freq: Once | Status: AC
  Administered 2016-08-16: 10:00:00
  Filled 2016-08-15: qty 1

## 2016-08-15 MED ORDER — FAMOTIDINE 20 MG PO TABS: 20.0000 mg | ORAL_TABLET | Freq: Two times a day (BID) | ORAL | Status: DC

## 2016-08-15 MED ORDER — HYPROMELLOSE (GONIOSCOPIC) 2.5 % OP SOLN
1.0000 [drp] | Freq: Three times a day (TID) | OPHTHALMIC | Status: DC | PRN
  Administered 2016-08-16 – 2016-08-17 (×2): 1 [drp] via OPHTHALMIC
  Filled 2016-08-15: qty 15

## 2016-08-15 MED ORDER — SENNOSIDES-DOCUSATE SODIUM 8.6-50 MG PO TABS
1.0000 | ORAL_TABLET | Freq: Every evening | ORAL | Status: DC | PRN
  Administered 2016-08-16: 1 via ORAL
  Filled 2016-08-15: qty 1

## 2016-08-15 MED ORDER — CITALOPRAM HYDROBROMIDE 10 MG PO TABS
5.0000 mg | ORAL_TABLET | Freq: Every day | ORAL | Status: DC
  Administered 2016-08-16 – 2016-08-18 (×3): 5 mg via ORAL
  Filled 2016-08-15 (×3): qty 1

## 2016-08-15 MED ORDER — CALCIUM-VITAMIN D 600-200 MG-UNIT PO TABS: 1.0000 | ORAL_TABLET | Freq: Two times a day (BID) | ORAL | Status: DC

## 2016-08-15 MED ORDER — ACETAMINOPHEN 325 MG PO TABS: 650.0000 mg | ORAL_TABLET | Freq: Four times a day (QID) | ORAL | Status: DC | PRN

## 2016-08-15 MED ORDER — PRESERVISION AREDS 2 PO CAPS: ORAL_CAPSULE | Freq: Two times a day (BID) | ORAL | Status: DC

## 2016-08-15 MED ORDER — CLOPIDOGREL BISULFATE 75 MG PO TABS
75.0000 mg | ORAL_TABLET | Freq: Every day | ORAL | Status: DC
  Administered 2016-08-16 – 2016-08-18 (×3): 75 mg via ORAL
  Filled 2016-08-15 (×3): qty 1

## 2016-08-15 NOTE — Consult Note (Signed)
Neurology Consult Note  Reason for Consultation: Stroke on MRI brain  Requesting provider: Carlisle Cater, PA  CC: Trouble walking  HPI: This is an 83 year old right-handed woman who presented to the ED complaining of some leg weakness and difficulty standing up today. MRI scan of the brain was performed in emergency department showed an acute right anterior cerebral artery infarction. Neurology consultation is now requested for further recommendations.  History is obtained from patient and from her niece was present at the bedside. The patient reports that she first noticed difficulty walking yesterday when she was at the store with her niece. She has trouble telling me exactly what problems she was having with her walking. She says that she didn't appreciate any weakness though her niece indicates that her left leg was weak. Symptoms persisted today and she presented to the emergency department for further evaluation.  They report that she has had a history of multiple TIAs in the past. Patient reports that these usually consist of speech changes. She has been taking Plavix at home. She denies missing any doses recently.  Last known well: 08/14/16 tPA given?: No, clearly outside of window was completed infarction on MRI scan   PMH:  Past Medical History:  Diagnosis Date  . Breast cancer ([place])    left breast  . Chronic constipation   . Chronic headaches   . Diabetes mellitus   . Fibrocystic breast   . Gastritis   . Gout    injection in the past  . Hyperlipidemia   . Melanoma in situ of left upper extremity ([place])   . Pelvic fracture ([place])   . TIA (transient ischemic attack) 08/2008   neg MRI/MRA and CT and carotid dopplers  . Venous insufficiency     PSH:  Past Surgical History:  Procedure Laterality Date  . APPENDECTOMY    . BREAST CYST ASPIRATION    . BREAST LUMPECTOMY  09/2007   with sentinel LN biopsy  . BREAST LUMPECTOMY     left  . EYE SURGERY     retinal, then  cataract    Family history: Family History  Problem Relation Age of Onset  . Breast cancer Sister   . Stroke Sister   . Cancer Sister     breast  . Breast cancer Sister   . Stroke Sister   . Cancer Sister     breast  . Stroke Mother   . Stroke Father   . Stroke Brother   . Cancer Brother     prostate  . Stroke Brother   . Stroke Sister   . CAD Sister     Social history:  Social History   Social History  . Marital status: Single    Spouse name: N/A  . Number of children: N/A  . Years of education: college   Occupational History  . Not on file.   Social History Main Topics  . Smoking status: Never Smoker  . Smokeless tobacco: Never Used  . Alcohol use No  . Drug use: No  . Sexual activity: Not Currently   Other Topics Concern  . Not on file   Social History Narrative   Lives with and cares for both sisters   Strong faith   Denies caffeine use     Current outpatient meds: Medications reviewed and reconciled Current Meds  Medication Sig  . acetaminophen (TYLENOL) 325 MG tablet Take 2 tablets (650 mg total) by mouth every 6 (six) hours as needed for mild pain (  or Fever >/= 101).  . calcium carbonate (TUMS - DOSED IN MG ELEMENTAL CALCIUM) 500 MG chewable tablet Chew 1 tablet by mouth as needed for heartburn.   . Calcium-Vitamin D 600-200 MG-UNIT per tablet Take 1 tablet by mouth 2 (two) times daily. Reported on 08/02/2015  . citalopram (CELEXA) 10 MG tablet Take 0.5 tablets (5 mg total) by mouth daily.  . clopidogrel (PLAVIX) 75 MG tablet TAKE ONE TABLET BY MOUTH EACH MORNING WITH BREAKFAST  . fish oil-omega-3 fatty acids 1000 MG capsule Take 1 g by mouth daily.    . hydroxypropyl methylcellulose / hypromellose (ISOPTO TEARS / GONIOVISC) 2.5 % ophthalmic solution Place 1 drop into the left eye 3 (three) times daily. (Patient taking differently: Place 1 drop into the left eye 3 (three) times daily as needed for dry eyes. )  . metFORMIN (GLUCOPHAGE) 500 MG tablet  Take 0.5 tablets (250 mg total) by mouth 2 (two) times daily with a meal.  . Multiple Vitamins-Minerals (PRESERVISION AREDS 2 PO) Take 1 tablet by mouth 2 (two) times daily.  Marland Kitchen NEXIUM 40 MG capsule Take 1 capsule (40 mg total) by mouth daily. Name brand please  . polyethylene glycol powder (GLYCOLAX/MIRALAX) powder DISSOLVE ONE (1) CAPFUL (17GM) INTO 4 TO8 OUNCES OF FLUID AND TAKE BYMOUTH ONCE DAILY AS DIRECTED  . ranitidine (ZANTAC) 300 MG tablet Take 1 tablet (300 mg total) by mouth daily as needed for heartburn.  . simethicone (MYLICON) 80 MG chewable tablet Chew 80 mg by mouth every 6 (six) hours as needed for flatulence.  . simvastatin (ZOCOR) 20 MG tablet TAKE ONE TABLET BY MOUTH EVERY NIGHT AT BEDTIME    Current inpatient meds: Medications reviewed and reconciled No current facility-administered medications for this encounter.    Current Outpatient Prescriptions  Medication Sig Dispense Refill  . acetaminophen (TYLENOL) 325 MG tablet Take 2 tablets (650 mg total) by mouth every 6 (six) hours as needed for mild pain (or Fever >/= 101).    . calcium carbonate (TUMS - DOSED IN MG ELEMENTAL CALCIUM) 500 MG chewable tablet Chew 1 tablet by mouth as needed for heartburn.     . Calcium-Vitamin D 600-200 MG-UNIT per tablet Take 1 tablet by mouth 2 (two) times daily. Reported on 08/02/2015    . citalopram (CELEXA) 10 MG tablet Take 0.5 tablets (5 mg total) by mouth daily. 15 tablet 11  . clopidogrel (PLAVIX) 75 MG tablet TAKE ONE TABLET BY MOUTH EACH MORNING WITH BREAKFAST 30 tablet 11  . fish oil-omega-3 fatty acids 1000 MG capsule Take 1 g by mouth daily.      . hydroxypropyl methylcellulose / hypromellose (ISOPTO TEARS / GONIOVISC) 2.5 % ophthalmic solution Place 1 drop into the left eye 3 (three) times daily. (Patient taking differently: Place 1 drop into the left eye 3 (three) times daily as needed for dry eyes. ) 15 mL 12  . metFORMIN (GLUCOPHAGE) 500 MG tablet Take 0.5 tablets (250 mg total)  by mouth 2 (two) times daily with a meal. 90 tablet 3  . Multiple Vitamins-Minerals (PRESERVISION AREDS 2 PO) Take 1 tablet by mouth 2 (two) times daily.    Marland Kitchen NEXIUM 40 MG capsule Take 1 capsule (40 mg total) by mouth daily. Name brand please 30 capsule 11  . polyethylene glycol powder (GLYCOLAX/MIRALAX) powder DISSOLVE ONE (1) CAPFUL (17GM) INTO 4 TO8 OUNCES OF FLUID AND TAKE BYMOUTH ONCE DAILY AS DIRECTED 527 g 11  . ranitidine (ZANTAC) 300 MG tablet Take 1 tablet (300  mg total) by mouth daily as needed for heartburn. 30 tablet 11  . simethicone (MYLICON) 80 MG chewable tablet Chew 80 mg by mouth every 6 (six) hours as needed for flatulence.    . simvastatin (ZOCOR) 20 MG tablet TAKE ONE TABLET BY MOUTH EVERY NIGHT AT BEDTIME 30 tablet 5  . glucose blood (ONE TOUCH ULTRA TEST) test strip USE AS DIRECTED TO CHECK BLOOD SUGAR ONCE A DAY AND AS NEEDED (DX. E11.9) (Patient not taking: Reported on 08/15/2016) 100 each 1  . hydrOXYzine (ATARAX/VISTARIL) 10 MG tablet Take 1 tablet (10 mg total) by mouth at bedtime as needed for itching. 30 tablet 1    Allergies: Allergies  Allergen Reactions  . Sulfonamide Derivatives Itching  . Aspirin Other (See Comments)    Burning and pain in stomach    ROS: As per HPI. A full 14-point review of systems was performed and is otherwise unremarkable.   PE:  BP 118/61   Pulse 77   Temp 98.7 F (37.1 C) (Oral)   Resp (!) 23   Ht 5\' 2"  (1.575 m)   Wt 54.4 kg (120 lb)   SpO2 93%   BMI 21.95 kg/m   General: WDWN Elderly Caucasian woman lying on ED gurney, no acute distress. Marland Kitchen Speech  notable for mild dysarthria. No aphasia.  Her voice is hypophonic but she is able to speak with more normal volume when encouraged to do so and when she puts forth effort.  Speed of processing appears to be mildly delayed. Affect is  flat Comportment is normal.  HEENT: Normocephalic. Neck supple without LAD. Mucous membranes are dry, OP clear. Dentition good. Sclerae anicteric.  No conjunctival injection.  CV: Regular, no murmur. Carotid pulses full and symmetric, no bruits. Distal pulses 2+ and symmetric.  Lungs: CTAB.  Abdomen: Soft, non-distended, non-tender. Bowel sounds present x4.  Extremities: No C/C/E. Neuro:  CN: Pupils are equal and round. They are symmetrically reactive from 3-->2 mm. EOMI without nystagmus.  She has breakup of smooth pursuit in all directions of gaze.No reported diplopia. Facial sensation is intact to light touch.  There is the suggestion of upper motor neuron weakness on the left side of her face with slight weakness of eye closure and slight asymmetry of the smile on that side. Hearing is intact to conversational voice. Palate elevates symmetrically and uvula is midline. Voice is hypophonic Bilateral SCM and trapezii are 5/5. Tongue is midline with normal bulk and mobility.  Motor: Normal bulk for age. Tone appears to be normal but she does have some  mitgehen paratonia. Strength is notable for mild weakness versus motor impersistence in the left leg. No tremor or other abnormal movements. No drift.  Sensation: Intact to light touch.  DTRs: 2+, symmetric. Toes downgoing bilaterally. No pathologic reflexes.  Coordination: Finger-to-nose  is slow but without dysmetria.   Labs:  Lab Results  Component Value Date   WBC 9.8 08/15/2016   HGB 13.9 08/15/2016   HCT 42.4 08/15/2016   PLT 188 08/15/2016   GLUCOSE 182 (H) 08/15/2016   CHOL 138 05/13/2016   TRIG 152.0 (H) 05/13/2016   HDL 40.70 05/13/2016   LDLDIRECT 76.2 04/24/2014   LDLCALC 67 05/13/2016   ALT 13 (L) 08/15/2016   AST 22 08/15/2016   NA 139 08/15/2016   K 4.3 08/15/2016   CL 106 08/15/2016   CREATININE 0.76 08/15/2016   BUN 14 08/15/2016   CO2 24 08/15/2016   TSH 5.87 (H) 08/02/2015  INR 1.05 08/15/2016   HGBA1C 6.9 (H) 05/13/2016   MICROALBUR <0.7 08/02/2015   PTT 31 Troponin 0.00 Urinalysis negative Urine drug screen negative  Imaging:  I have personally and  independently reviewed the MRI scan of the brain without contrast from today. This shows patchy restricted diffusion in the parasagittal aspect of the right frontal lobe, consistent with acute ischemic infarction. A moderate degree of chronic small vessel ischemic disease is present. Mild to moderate diffuse generalized atrophy is present.  Other diagnostic studies:  TTE from 03/02/16: Mild concentric hypertrophy of the left ventricle with ejection fraction 65-70 percent; grade 1 diastolic dysfunction; mild aortic regurgitation; calcification of the mitral valve annulus with mild mitral regurgitation  Assessment and Plan:  1. Acute Ischemic Stroke: This is an acute stroke involving the R ACA territory. It is most likely embolic in etiology. Known risk factors for cerebrovascular disease in this patient include age, hyperlipidemia, history of TIA, diabetes. Additional workup will be ordered to include carotid Dopplers, TTE, fasting lipids, and hemoglobin a1c. Further testing will be determined by results from these initial studies. Recommend continued antiplatelet therapy with Plavix for secondary stroke prevention once cleared to take oral medications. Continue statin with goal LDL less than 70. Ensure adequate glucose control. Allow permissive hypertension in the acute phase, treating only SBP greater than 220 mmHg and/or DBP greater than 110 mmHg. Avoid fever and hyperglycemia as these can extend the infarct. Avoid hypotonic IVF to minimize exacerbation of post-stroke edema. Initiate rehab services. DVT prophylaxis as needed.   2. Left lower extremity weakness: This is acute, due to stroke. PT/rehabilitation as needed.  3. Dysphonia: This seems to be relatively new issue today, though the patient reports that she has had similar issues in the past which are benign attributed to TIA. Speech therapy as needed.  4. Abnormality of gait: This is acute, due to stroke. PT/rehabilitation as needed.    The  patient would likely benefit from acute rehab services. Recommend consultation of PT/OT/SLP with consideration for PM&R consult as appropriate depending upon clinical progress and therapists' recommendations.   Fall risk: Risk factors for falls include age, new left leg weakness, polypharmacy,  female gender. Patient educated on risk of falls and use of call bell. Patient was counseled not to get up without assistance.  Fall precautions.Limit psychoactive medications and sedating medications.  Delirium risk: Risk factors for delirium include age,  acute stroke, medications/polypharmacy. Optimize metabolic status as needed. Minimize the use of opiates, benzos or any medication with strong anticholinergic properties as much as possible. Optimize sleep-wake cycles as much as you can by keeping the room bright with activity during the day and dark and quiet at night.   Needs outpatient neurology follow-up?:  She would benefit from outpatient follow-up with stroke neurology. This can be arranged at the time of discharge.  This was discussed with the patient and her nieces. Education was provided on the diagnosis and expected evaluation and treatment. They are in agreement with the plan as noted. They were given the opportunity to ask any questions and these were addressed to their satisfaction.   Thank you for this consultation. The stroke team will assume care of the patient beginning 08/16/16. Please call if any urgent questions or concerns arise.

## 2016-08-15 NOTE — ED Notes (Signed)
Patient is stable and ready to be transport to the floor at this time.  Report was called to 84M RN.  Belongings taken with the patient to the floor. Patient can be transported after night shift report.

## 2016-08-15 NOTE — ED Notes (Signed)
Patient taken to XRAY

## 2016-08-15 NOTE — ED Triage Notes (Signed)
Pt. Stated, I had difficult getting up yesterday and Im having pain in my rt. Leg. I've also been SOB today.

## 2016-08-15 NOTE — H&P (Signed)
[facility] History and Physical  Lauren [name] LYY:[id number] DOB: Jan 27, 1928 DOA: 08/15/2016  Referring physician: ER MD PCP: [name], MD   Chief Complaint: left sided weakness/facial droop  HPI: Lauren [name] is a 83 y.o. female with significant history of a prior TIA on Plavix, diabetes, breast cancer, presenting with worsening left-sided weakness that started yesterday.  She had noted progressive left-sided weakness, trouble picking up her left leg and getting as the car and difficulty walking while grocery shopping.  Family members stated they watched her overnight to see if she would get better this morning.  They took her to ER b/c she was not getting better, and they noted a left sided factial droop today as well (not noticed yesterday).  Denies drooling, dysarthria, cp/palpitations/n/v/f/c.    Pt's 2 nieces, who are POA, are present at bedside and giving me most of history.  In ED, pt had MRI brain done, seen by Neurologist, outside of window for TPA. VS on initial ED arrival, 98.7, rr 22, hr 76, 145/68  On my arrival, pt was pleasant and able to answer simple questions.    Review of Systems:  Limited hx from patient, since most hx obtained from nieces.   Past Medical History:  Diagnosis Date  . Breast cancer ([place])    left breast  . Chronic constipation   . Chronic headaches   . Diabetes mellitus   . Fibrocystic breast   . Gastritis   . Gout    injection in the past  . Hyperlipidemia   . Melanoma in situ of left upper extremity ([place])   . Pelvic fracture ([place])   . TIA (transient ischemic attack) 08/2008   neg MRI/MRA and CT and carotid dopplers  . Venous insufficiency    Past Surgical History:  Procedure Laterality Date  . APPENDECTOMY    . BREAST CYST ASPIRATION    . BREAST LUMPECTOMY  09/2007   with sentinel LN biopsy  . BREAST LUMPECTOMY     left  . EYE SURGERY     retinal, then cataract   Social History:  reports that she has never smoked.  She has never used smokeless tobacco. She reports that she does not drink alcohol or use drugs.  Allergies  Allergen Reactions  . Sulfonamide Derivatives Itching  . Aspirin Other (See Comments)    Burning and pain in stomach    Family History  Problem Relation Age of Onset  . Breast cancer Sister   . Stroke Sister   . Cancer Sister     breast  . Breast cancer Sister   . Stroke Sister   . Cancer Sister     breast  . Stroke Mother   . Stroke Father   . Stroke Brother   . Cancer Brother     prostate  . Stroke Brother   . Stroke Sister   . CAD Sister     Prior to Admission medications   Medication Sig Start Date End Date Taking? Authorizing Provider  acetaminophen (TYLENOL) 325 MG tablet Take 2 tablets (650 mg total) by mouth every 6 (six) hours as needed for mild pain (or Fever >/= 101). 03/02/16  Yes Eber Jones, MD  calcium carbonate (TUMS - DOSED IN MG ELEMENTAL CALCIUM) 500 MG chewable tablet Chew 1 tablet by mouth as needed for heartburn.    Yes Historical Provider, MD  Calcium-Vitamin D 600-200 MG-UNIT per tablet Take 1 tablet by mouth 2 (two) times daily. Reported on 08/02/2015  Yes Historical Provider, MD  citalopram (CELEXA) 10 MG tablet Take 0.5 tablets (5 mg total) by mouth daily. 08/06/15  Yes Abner Greenspan, MD  clopidogrel (PLAVIX) 75 MG tablet TAKE ONE TABLET BY MOUTH EACH MORNING WITH BREAKFAST 08/02/15  Yes Abner Greenspan, MD  fish oil-omega-3 fatty acids 1000 MG capsule Take 1 g by mouth daily.     Yes Historical Provider, MD  hydroxypropyl methylcellulose / hypromellose (ISOPTO TEARS / GONIOVISC) 2.5 % ophthalmic solution Place 1 drop into the left eye 3 (three) times daily. Patient taking differently: Place 1 drop into the left eye 3 (three) times daily as needed for dry eyes.  06/13/16  Yes Merlyn Lot, MD  metFORMIN (GLUCOPHAGE) 500 MG tablet Take 0.5 tablets (250 mg total) by mouth 2 (two) times daily with a meal. 11/27/15  Yes Abner Greenspan, MD    Multiple Vitamins-Minerals (PRESERVISION AREDS 2 PO) Take 1 tablet by mouth 2 (two) times daily.   Yes Historical Provider, MD  NEXIUM 40 MG capsule Take 1 capsule (40 mg total) by mouth daily. Name brand please 11/12/15  Yes Abner Greenspan, MD  polyethylene glycol powder (GLYCOLAX/MIRALAX) powder DISSOLVE ONE (1) CAPFUL (17GM) INTO 4 TO8 OUNCES OF FLUID AND TAKE BYMOUTH ONCE DAILY AS DIRECTED 08/02/15  Yes Abner Greenspan, MD  ranitidine (ZANTAC) 300 MG tablet Take 1 tablet (300 mg total) by mouth daily as needed for heartburn. 11/12/15  Yes Abner Greenspan, MD  simethicone (MYLICON) 80 MG chewable tablet Chew 80 mg by mouth every 6 (six) hours as needed for flatulence.   Yes Historical Provider, MD  simvastatin (ZOCOR) 20 MG tablet TAKE ONE TABLET BY MOUTH EVERY NIGHT AT BEDTIME 06/15/16  Yes Marne A Tower, MD  glucose blood (ONE TOUCH ULTRA TEST) test strip USE AS DIRECTED TO CHECK BLOOD SUGAR ONCE A DAY AND AS NEEDED (DX. E11.9) Patient not taking: Reported on 08/15/2016 03/14/15   Abner Greenspan, MD  hydrOXYzine (ATARAX/VISTARIL) 10 MG tablet Take 1 tablet (10 mg total) by mouth at bedtime as needed for itching. 07/08/16   Abner Greenspan, MD   Physical Exam: Vitals:   08/15/16 1430 08/15/16 1445 08/15/16 1500 08/15/16 1515  BP: (!) 167/77 (!) 152/71 (!) 171/79 (!) 175/88  Pulse: 72 76 74 66  Resp: 19 (!) 24 (!) 22 (!) 28  Temp:      TempSrc:      SpO2: 96% 95% 97% 96%  Weight:      Height:        Wt Readings from Last 3 Encounters:  08/15/16 54.4 kg (120 lb)  07/17/16 54.7 kg (120 lb 8 oz)  06/13/16 54.4 kg (120 lb)    General:  Appears calm and comfortable, pleasant, NAD, oriented to self, speech intact, mild dysarthria noted, speech slow, slight facial droop to left side. Eyes: PERRL, EOMI, no nystagmus, normal lids, irises & conjunctiva ENT: grossly normal hearing, lips & tongue, mmm - tongue midline. Neck: no LAD, masses or thyromegaly Cardiovascular: RRR, no m/r/g. No LE edema. No  jvp. Telemetry: SR, no arrhythmias  Respiratory: CTA bilaterally, no w/r/r. Normal respiratory effort. Abdomen: soft, ntnd Skin: no rash or induration seen on limited exam Musculoskeletal: grossly normal tone BUE/BLE, mild weakness on left leg compared to right leg. Psychiatric: grossly normal mood and affect, speech fluent and appropriate Neurologic: grossly non-focal.  Mild dysarthria noted           Labs on Admission:  Basic Metabolic  Panel:  Recent Labs Lab 08/15/16 1105  NA 139  K 4.3  CL 106  CO2 24  GLUCOSE 182*  BUN 14  CREATININE 0.76  CALCIUM 9.8   Liver Function Tests:  Recent Labs Lab 08/15/16 1105  AST 22  ALT 13*  ALKPHOS 60  BILITOT 0.5  PROT 6.6  ALBUMIN 3.5   No results for input(s): LIPASE, AMYLASE in the last 168 hours. No results for input(s): AMMONIA in the last 168 hours. CBC:  Recent Labs Lab 08/15/16 1105  WBC 9.8  NEUTROABS 7.0  HGB 13.9  HCT 42.4  MCV 95.9  PLT 188   Cardiac Enzymes: No results for input(s): CKTOTAL, CKMB, CKMBINDEX, TROPONINI in the last 168 hours.  BNP (last 3 results) No results for input(s): BNP in the last 8760 hours.  ProBNP (last 3 results) No results for input(s): PROBNP in the last 8760 hours.  CBG: No results for input(s): GLUCAP in the last 168 hours.  Radiological Exams on Admission: Dg Chest 2 View  Result Date: 08/15/2016 CLINICAL DATA:  Left facial droop, left-sided weakness. EXAM: CHEST  2 VIEW COMPARISON:  Radiographs of May 01, 2014. FINDINGS: The heart size and mediastinal contours are within normal limits. Both lungs are clear. Stable elevated left hemidiaphragm is noted. No pneumothorax or pleural effusion is noted. The visualized skeletal structures are unremarkable. IMPRESSION: No active cardiopulmonary disease. Electronically Signed   By: Marijo Conception, M.D.   On: 08/15/2016 15:36   Mr Brain Wo Contrast  Result Date: 08/15/2016 CLINICAL DATA:  Patient states RIGHT leg pain.  Difficulty getting up. EXAM: MRI HEAD WITHOUT CONTRAST TECHNIQUE: Multiplanar, multiecho pulse sequences of the brain and surrounding structures were obtained without intravenous contrast. COMPARISON:  CT head 06/13/2016. MR head 02/29/2016. MRA head 02/29/2016. FINDINGS: Brain: Acute infarction, RIGHT ACA territory, primarily involving the RIGHT-sided genu and body of corpus callosum. Medial RIGHT frontal cortex and cingulate gyrus not significantly affected. No hemorrhage, mass lesion, hydrocephalus, or extra-axial fluid. Global atrophy. Moderately advanced chronic microvascular ischemic change. Vascular: Normal flow voids. Proximally, the RIGHT ACA is patent. Tiny focus chronic hemorrhage, LEFT centrum semiovale. Skull and upper cervical spine: Unremarkable. Sinuses/Orbits: Negative.  BILATERAL cataract extraction Other: None. IMPRESSION: Acute distal RIGHT anterior cerebral artery territory infarction, primarily involving the corpus callosum. Atrophy and small vessel disease, similar to priors. Electronically Signed   By: Staci Righter M.D.   On: 08/15/2016 13:29    EKG: Independently reviewed.  @ 1037am; SR w/ pvcs. 87bpm, nad,. No acute st changes.  EKG Interpretation  Date/Time:    Ventricular Rate:    PR Interval:    QRS Duration:   QT Interval:    QTC Calculation:   R Axis:     Text Interpretation:          Assessment/Plan Principal Problem:   Acute CVA (cerebrovascular accident) ([place]) Active Problems:   Diabetes type 2, controlled ([place])   HTN (hypertension)   HYPERCHOLESTEROLEMIA, PURE   History of breast cancer, left   TIA (transient ischemic attack)   1. Acute cva, involving R ACA territory on MRI, most likely ischemic - admit to tele - neurochecks - Neuro/stroke team was cs, appreciate assistance, recd continue Plavix at this time. No asa.  - out of tpa window. - permissive htn, treat sbp only sbp >220 and/or DBP >110 - PT/OT/Speech/cm eval - check bilat carotid US,  echo, tsh  2. htn - was diet controlled, not on antihypertensives on home meds -  permissive htn for now  3. Dm 2 - hold metformin - chk a1c - riss for now.  4. hld - chk fasting lipids - continue simvastatin 20 qhs for now.  5. Anxiety/depression - continue celexa 5mg  qd  6. gerd - continue ppi 40 qd and pepcid (for zantac)  Neurology /stroke team, I talked to Dr Shon Hale as well  Cm c/s for OOH dnr per family request  Code Status: dnr  (must indicate code status--if unknown or must be presumed, indicate so),  DVT Prophylaxis:  Hep [place] bid Family Communication: pt and 2 nieces at bedside (poa) (indicate person spoken with, if applicable, with phone number if by telephone) Disposition Plan:  intp tele (indicate anticipated LOS)  Time spent: 66mins  Maren Reamer MD., MBA/MHA [facility] Pager [phone]

## 2016-08-15 NOTE — ED Notes (Signed)
Patient transported to MRI 

## 2016-08-15 NOTE — ED Notes (Signed)
Report attempted x 1

## 2016-08-15 NOTE — ED Notes (Signed)
Patient transported by EMT upstairs now

## 2016-08-15 NOTE — Progress Notes (Signed)
  Patient admitted from home through [facility] ED with right Sided weakness. Pt. awake alert and oriented to person ,time place and situation. Oriented to room and unit. Safety education given Call bell within reach to call as needed. Cardiac monitor In use. Will continue to monitor pt.

## 2016-08-15 NOTE — ED Provider Notes (Signed)
[place] DEPT Provider Note   CSN: [id number] Arrival date & time: 08/15/16  1030     History   Chief Complaint Chief Complaint  Patient presents with  . Weakness  . Shortness of Breath    HPI Lauren [name] is a 83 y.o. female.  Patient with history of TIA on Plavix, diabetes, breast cancer -- presents with complaints of gait abnormality, left sided weakness, and left facial droop starting acutely early yesterday morning (greater than 24 hours from arrival). Yesterday the patient's daughters noted that she was having difficulty with walking. She had trouble picking up her left leg and getting out of a car. Although, she was able to go grocery shopping using a cart to help her walk. They did not notice any facial droop yesterday. This morning patient had more pronounced left-sided facial droop. A caregiver recommended that she come to the emergency department for evaluation. Patient has been compliant with her antiplatelet medication. Family noted that she has had word finding difficulties at times, however this is not constant. Patient has not fallen. She denies significant headache or neck pain. No fevers, cough, or other signs of infection. No history of seizures. Previous TIA symptoms have been short-lived and today is different because symptoms have not resolved. The onset of this condition was acute. The course is constant. Aggravating factors: none. Alleviating factors: none.        Past Medical History:  Diagnosis Date  . Breast cancer ([place])    left breast  . Chronic constipation   . Chronic headaches   . Diabetes mellitus   . Fibrocystic breast   . Gastritis   . Gout    injection in the past  . Hyperlipidemia   . Melanoma in situ of left upper extremity ([place])   . Pelvic fracture ([place])   . TIA (transient ischemic attack) 08/2008   neg MRI/MRA and CT and carotid dopplers  . Venous insufficiency     Patient Active Problem List   Diagnosis Date Noted  . TIA  (transient ischemic attack) 02/28/2016  . GERD (gastroesophageal reflux disease) 08/06/2015  . Hearing loss 08/06/2015  . Estrogen deficiency 05/01/2014  . Viral URI with cough 04/02/2014  . Left knee pain 09/22/2013  . Chest pain, musculoskeletal 08/05/2013  . Encounter for Medicare annual wellness exam 04/04/2013  . Colon cancer screening 04/04/2013  . History of melanoma excision 09/27/2012  . History of breast cancer, left 01/08/2011  . History of pelvic fracture 09/17/2010  . DEGENERATIVE JOINT DISEASE 02/13/2010  . Migraine variant 11/06/2009  . Osteopenia 10/18/2008  . Breast cancer, left breast ([place]) 09/10/2008  . Transient ischemic attack 09/10/2008  . Diabetes type 2, controlled ([place]) 10/06/2007  . VENOUS INSUFFICIENCY 10/06/2007  . Diverticulosis of colon 10/06/2007  . FIBROCYSTIC BREAST DISEASE 10/06/2007  . HYPERCHOLESTEROLEMIA, PURE 11/29/2006    Past Surgical History:  Procedure Laterality Date  . APPENDECTOMY    . BREAST CYST ASPIRATION    . BREAST LUMPECTOMY  09/2007   with sentinel LN biopsy  . BREAST LUMPECTOMY     left  . EYE SURGERY     retinal, then cataract    OB History    No data available       Home Medications    Prior to Admission medications   Medication Sig Start Date End Date Taking? Authorizing Provider  acetaminophen (TYLENOL) 325 MG tablet Take 2 tablets (650 mg total) by mouth every 6 (six) hours as needed for mild pain (  or Fever >/= 101). 03/02/16  Yes Eber Jones, MD  calcium carbonate (TUMS - DOSED IN MG ELEMENTAL CALCIUM) 500 MG chewable tablet Chew 1 tablet by mouth as needed for heartburn.    Yes Historical Provider, MD  Calcium-Vitamin D 600-200 MG-UNIT per tablet Take 1 tablet by mouth 2 (two) times daily. Reported on 08/02/2015   Yes Historical Provider, MD  citalopram (CELEXA) 10 MG tablet Take 0.5 tablets (5 mg total) by mouth daily. 08/06/15  Yes Abner Greenspan, MD  clopidogrel (PLAVIX) 75 MG tablet TAKE ONE TABLET  BY MOUTH EACH MORNING WITH BREAKFAST 08/02/15  Yes Abner Greenspan, MD  fish oil-omega-3 fatty acids 1000 MG capsule Take 1 g by mouth daily.     Yes Historical Provider, MD  hydroxypropyl methylcellulose / hypromellose (ISOPTO TEARS / GONIOVISC) 2.5 % ophthalmic solution Place 1 drop into the left eye 3 (three) times daily. Patient taking differently: Place 1 drop into the left eye 3 (three) times daily as needed for dry eyes.  06/13/16  Yes Merlyn Lot, MD  metFORMIN (GLUCOPHAGE) 500 MG tablet Take 0.5 tablets (250 mg total) by mouth 2 (two) times daily with a meal. 11/27/15  Yes Abner Greenspan, MD  Multiple Vitamins-Minerals (PRESERVISION AREDS 2 PO) Take 1 tablet by mouth 2 (two) times daily.   Yes Historical Provider, MD  NEXIUM 40 MG capsule Take 1 capsule (40 mg total) by mouth daily. Name brand please 11/12/15  Yes Abner Greenspan, MD  polyethylene glycol powder (GLYCOLAX/MIRALAX) powder DISSOLVE ONE (1) CAPFUL (17GM) INTO 4 TO8 OUNCES OF FLUID AND TAKE BYMOUTH ONCE DAILY AS DIRECTED 08/02/15  Yes Abner Greenspan, MD  ranitidine (ZANTAC) 300 MG tablet Take 1 tablet (300 mg total) by mouth daily as needed for heartburn. 11/12/15  Yes Abner Greenspan, MD  simethicone (MYLICON) 80 MG chewable tablet Chew 80 mg by mouth every 6 (six) hours as needed for flatulence.   Yes Historical Provider, MD  simvastatin (ZOCOR) 20 MG tablet TAKE ONE TABLET BY MOUTH EVERY NIGHT AT BEDTIME 06/15/16  Yes Marne A Tower, MD  glucose blood (ONE TOUCH ULTRA TEST) test strip USE AS DIRECTED TO CHECK BLOOD SUGAR ONCE A DAY AND AS NEEDED (DX. E11.9) Patient not taking: Reported on 08/15/2016 03/14/15   Abner Greenspan, MD  hydrOXYzine (ATARAX/VISTARIL) 10 MG tablet Take 1 tablet (10 mg total) by mouth at bedtime as needed for itching. 07/08/16   Abner Greenspan, MD    Family History Family History  Problem Relation Age of Onset  . Breast cancer Sister   . Stroke Sister   . Cancer Sister     breast  . Breast cancer Sister   .  Stroke Sister   . Cancer Sister     breast  . Stroke Mother   . Stroke Father   . Stroke Brother   . Cancer Brother     prostate  . Stroke Brother   . Stroke Sister   . CAD Sister     Social History Social History  Substance Use Topics  . Smoking status: Never Smoker  . Smokeless tobacco: Never Used  . Alcohol use No     Allergies   Sulfonamide derivatives and Aspirin   Review of Systems Review of Systems  Constitutional: Negative for fever.  HENT: Negative for congestion, dental problem, rhinorrhea and sinus pressure.   Eyes: Negative for photophobia, discharge, redness and visual disturbance.  Respiratory: Negative for shortness of breath.  Cardiovascular: Negative for chest pain.  Gastrointestinal: Negative for nausea and vomiting.  Musculoskeletal: Positive for gait problem. Negative for neck pain and neck stiffness.  Skin: Negative for rash.  Neurological: Positive for facial asymmetry, speech difficulty and weakness. Negative for syncope, light-headedness, numbness and headaches.  Psychiatric/Behavioral: Negative for confusion.     Physical Exam Updated Vital Signs BP (!) 131/54   Pulse 73   Temp 98.7 F (37.1 C) (Oral)   Resp (!) 23   Ht 5\' 2"  (1.575 m)   Wt 54.4 kg   SpO2 95%   BMI 21.95 kg/m   Physical Exam  Constitutional: She is oriented to person, place, and time. She appears well-developed and well-nourished.  HENT:  Head: Normocephalic and atraumatic.  Right Ear: Tympanic membrane, external ear and ear canal normal.  Left Ear: Tympanic membrane, external ear and ear canal normal.  Nose: Nose normal.  Mouth/Throat: Uvula is midline, oropharynx is clear and moist and mucous membranes are normal.  Eyes: Conjunctivae, EOM and lids are normal. Pupils are equal, round, and reactive to light. Right eye exhibits no nystagmus. Left eye exhibits no nystagmus.  Neck: Normal range of motion. Neck supple.  Cardiovascular: Normal rate and regular  rhythm.   Pulmonary/Chest: Effort normal and breath sounds normal.  Abdominal: Soft. There is no tenderness.  Musculoskeletal:       Cervical back: She exhibits normal range of motion, no tenderness and no bony tenderness.  Neurological: She is alert and oriented to person, place, and time. She has normal reflexes. A cranial nerve deficit is present. No sensory deficit. She displays a negative Romberg sign. Coordination normal. GCS eye subscore is 4. GCS verbal subscore is 5. GCS motor subscore is 6.  Cranial nerves: Patient with mild left-sided facial droop. Forehead is spared. Normal tongue protrusion.  Trace weakness in left upper extremity and left lower extremity.   Skin: Skin is warm and dry.  Psychiatric: She has a normal mood and affect.  Nursing note and vitals reviewed.    ED Treatments / Results  Labs (all labs ordered are listed, but only abnormal results are displayed) Labs Reviewed  COMPREHENSIVE METABOLIC PANEL - Abnormal; Notable for the following:       Result Value   Glucose, Bld 182 (*)    ALT 13 (*)    All other components within normal limits  PROTIME-INR  APTT  CBC  DIFFERENTIAL  URINALYSIS, ROUTINE W REFLEX MICROSCOPIC  RAPID URINE DRUG SCREEN, HOSP PERFORMED  HEMOGLOBIN A1C  CBC  CREATININE, SERUM  TSH  I-STAT TROPOININ, ED   ED ECG REPORT   Date: 08/15/2016  Rate: 87  Rhythm: normal sinus rhythm  QRS Axis: normal  Intervals: normal  ST/T Wave abnormalities: normal  Conduction Disutrbances:none  Narrative Interpretation:   Old EKG Reviewed: changes noted, now PVC  I have personally reviewed the EKG tracing and agree with the computerized printout as noted.  Radiology Mr Brain Wo Contrast  Result Date: 08/15/2016 CLINICAL DATA:  Patient states RIGHT leg pain. Difficulty getting up. EXAM: MRI HEAD WITHOUT CONTRAST TECHNIQUE: Multiplanar, multiecho pulse sequences of the brain and surrounding structures were obtained without intravenous  contrast. COMPARISON:  CT head 06/13/2016. MR head 02/29/2016. MRA head 02/29/2016. FINDINGS: Brain: Acute infarction, RIGHT ACA territory, primarily involving the RIGHT-sided genu and body of corpus callosum. Medial RIGHT frontal cortex and cingulate gyrus not significantly affected. No hemorrhage, mass lesion, hydrocephalus, or extra-axial fluid. Global atrophy. Moderately advanced chronic microvascular ischemic change. Vascular:  Normal flow voids. Proximally, the RIGHT ACA is patent. Tiny focus chronic hemorrhage, LEFT centrum semiovale. Skull and upper cervical spine: Unremarkable. Sinuses/Orbits: Negative.  BILATERAL cataract extraction Other: None. IMPRESSION: Acute distal RIGHT anterior cerebral artery territory infarction, primarily involving the corpus callosum. Atrophy and small vessel disease, similar to priors. Electronically Signed   By: Staci Righter M.D.   On: 08/15/2016 13:29    Procedures Procedures (including critical care time)  Medications Ordered in ED Medications  simvastatin (ZOCOR) tablet 20 mg (not administered)  hydroxypropyl methylcellulose / hypromellose (ISOPTO TEARS / GONIOVISC) 2.5 % ophthalmic solution 1 drop (not administered)  acetaminophen (TYLENOL) tablet 650 mg (not administered)  calcium carbonate (TUMS - dosed in mg elemental calcium) chewable tablet 200 mg of elemental calcium (not administered)  pantoprazole (PROTONIX) EC tablet 40 mg (not administered)  famotidine (PEPCID) tablet 20 mg (not administered)  clopidogrel (PLAVIX) tablet 75 mg (not administered)  citalopram (CELEXA) tablet 5 mg (not administered)  simethicone (MYLICON) chewable tablet 80 mg (not administered)  PRESERVISION AREDS 2 CAPS (not administered)  Calcium-Vitamin D 600-200 MG-UNIT 1 tablet (not administered)  fish oil-omega-3 fatty acids capsule 1 g (not administered)   stroke: mapping our early stages of recovery book (not administered)  acetaminophen (TYLENOL) tablet 650 mg (not  administered)    Or  acetaminophen (TYLENOL) solution 650 mg (not administered)    Or  acetaminophen (TYLENOL) suppository 650 mg (not administered)  senna-docusate (Senokot-S) tablet 1 tablet (not administered)  heparin injection 5,000 Units (not administered)  insulin aspart (novoLOG) injection 0-9 Units (not administered)     Initial Impression / Assessment and Plan / ED Course  I have reviewed the triage vital signs and the nursing notes.  Pertinent labs & imaging results that were available during my care of the patient were reviewed by me and considered in my medical decision making (see chart for details).     Patient seen and examined. Work-up initiated. Discussed with Dr. Sherry Ruffing who will see.   Vital signs reviewed and are as follows: BP (!) 131/54   Pulse 73   Temp 98.7 F (37.1 C) (Oral)   Resp (!) 23   Ht 5\' 2"  (1.575 m)   Wt 54.4 kg   SpO2 95%   BMI 21.95 kg/m   Pt and family updated on MRI result. Will admit.   Spoke with Dr. Shon Hale who will consult.   Spoke with [facility] who will see.   Final Clinical Impressions(s) / ED Diagnoses   Final diagnoses:  Acute CVA (cerebrovascular accident) ([place])  Acute CVA (cerebrovascular accident) ([place])   [place].   New Prescriptions New Prescriptions   No medications on file     Carlisle Cater, PA-C 08/15/16 [place], MD 08/16/16 [phone]

## 2016-08-16 ENCOUNTER — Inpatient Hospital Stay (HOSPITAL_COMMUNITY): Payer: Medicare Other

## 2016-08-16 DIAGNOSIS — Z853 Personal history of malignant neoplasm of breast: Secondary | ICD-10-CM

## 2016-08-16 DIAGNOSIS — I6789 Other cerebrovascular disease: Secondary | ICD-10-CM

## 2016-08-16 DIAGNOSIS — E1159 Type 2 diabetes mellitus with other circulatory complications: Secondary | ICD-10-CM

## 2016-08-16 DIAGNOSIS — I639 Cerebral infarction, unspecified: Secondary | ICD-10-CM

## 2016-08-16 LAB — VAS US CAROTID
LCCADDIAS: 6 cm/s
LCCAPSYS: 66 cm/s
LEFT ECA DIAS: -7 cm/s
LEFT VERTEBRAL DIAS: 11 cm/s
LICADSYS: -80 cm/s
LICAPSYS: -57 cm/s
Left CCA dist sys: 76 cm/s
Left CCA prox dias: 16 cm/s
Left ICA dist dias: -15 cm/s
Left ICA prox dias: -14 cm/s
RCCAPSYS: 67 cm/s
RIGHT ECA DIAS: -2 cm/s
RIGHT VERTEBRAL DIAS: -13 cm/s
Right CCA prox dias: 10 cm/s
Right cca dist sys: -67 cm/s

## 2016-08-16 LAB — GLUCOSE, CAPILLARY
GLUCOSE-CAPILLARY: 100 mg/dL — AB (ref 65–99)
GLUCOSE-CAPILLARY: 128 mg/dL — AB (ref 65–99)
Glucose-Capillary: 151 mg/dL — ABNORMAL HIGH (ref 65–99)

## 2016-08-16 LAB — HEMOGLOBIN A1C
Hgb A1c MFr Bld: 6.7 % — ABNORMAL HIGH (ref 4.8–5.6)
Mean Plasma Glucose: 146 mg/dL

## 2016-08-16 LAB — LIPID PANEL
Cholesterol: 135 mg/dL (ref 0–200)
HDL: 34 mg/dL — AB (ref >40)
LDL CALC: 71 mg/dL (ref 0–99)
Total CHOL/HDL Ratio: 4 RATIO
Triglycerides: 152 mg/dL — ABNORMAL HIGH (ref <150)
VLDL: 30 mg/dL (ref 0–40)

## 2016-08-16 LAB — ECHOCARDIOGRAM COMPLETE
HEIGHTINCHES: 62 in
Weight: 1942.4 oz

## 2016-08-16 MED ORDER — OMEGA-3-ACID ETHYL ESTERS 1 G PO CAPS
1.0000 g | ORAL_CAPSULE | Freq: Every day | ORAL | Status: DC
  Administered 2016-08-16 – 2016-08-18 (×3): 1 g via ORAL
  Filled 2016-08-16 (×3): qty 1

## 2016-08-16 NOTE — Progress Notes (Signed)
VASCULAR LAB PRELIMINARY  PRELIMINARY  PRELIMINARY  PRELIMINARY  Carotid duplex completed.    Preliminary report:  1-39% ICA plaquing. Vertebral artery flow is antegrade.   Hubbard [place]don, RVT 08/16/2016, 12:06 PM

## 2016-08-16 NOTE — Evaluation (Signed)
Occupational Therapy Evaluation Patient Details Name: Lauren [name] MRN: [id number] DOB: 03-06-1928 Today's Date: 08/16/2016    History of Present Illness This is an 84 year old right-handed woman who presented to the ED complaining of some leg weakness and difficulty standing up today. MRI scan of the brain was performed in emergency department showed an acute right anterior cerebral artery infarction   Clinical Impression   PTA Pt independent in ADL and mobility Pt managing medications, balancing checkbook, etc. Pt currently mod A for ADL and mod assist (+2 for safety) for mobility with RW. Pt with increased processing time required and verbal and tactile cues required for ADL completion. Pt will require skilled OT in the acute setting to maximize safety and independence in ADL, and will require SNF level therapy upon dc to maximize independence with the goal to return to PLOF. Next session to focus on following multi-step commands and functional transfers.     Follow Up Recommendations  SNF;Supervision/Assistance - 24 hour    Equipment Recommendations  Other (comment) (defer to next venue)    Recommendations for Other Services       Precautions / Restrictions Precautions Precautions: Fall Restrictions Weight Bearing Restrictions: No      Mobility Bed Mobility               General bed mobility comments: Not assessed this session. received in bathroom, and went to chair  Transfers Overall transfer level: Needs assistance Equipment used: Rolling walker (2 wheeled) Transfers: Sit to/from Stand Sit to Stand: Mod assist         General transfer comment: Use of gait belt to assist with power up from [facility], and strong posterior lean    Balance Overall balance assessment: Needs assistance Sitting-balance support: Bilateral upper extremity supported;Feet supported Sitting balance-Leahy Scale: Fair   Postural control: Posterior lean Standing balance support: Bilateral  upper extremity supported;During functional activity Standing balance-Leahy Scale: Poor                             ADL either performed or assessed with clinical judgement   ADL Overall ADL's : Needs assistance/impaired Eating/Feeding: Supervision/ safety   Grooming: Set up;Sitting Grooming Details (indicate cue type and reason): Able to wash hands with vc standing, and then too fatigued to continue. Needed max vc seated in recliner to brush teeth (Pt provided with toothbrush with toothpaste on and demonstration/prompts to participate - after Pt requested to brush teeth) Upper Body Bathing: Moderate assistance;Sitting   Lower Body Bathing: Maximal assistance;Sitting/lateral leans Lower Body Bathing Details (indicate cue type and reason): For legs Upper Body Dressing : Minimal assistance Upper Body Dressing Details (indicate cue type and reason): extended time required Lower Body Dressing: Total assistance;Sit to/from stand Lower Body Dressing Details (indicate cue type and reason): to doff/don socks Toilet Transfer: Moderate assistance;Ambulation;BSC;RW Toilet Transfer Details (indicate cue type and reason): Pt was on the [facility] over toilet in bathroom when OT entered, Pt needed mod assist to stand up from toilet with vc for hand placement and mod assist to power up Toileting- Clothing Manipulation and Hygiene: Total assistance;Sit to/from stand Toileting - Clothing Manipulation Details (indicate cue type and reason): Pt able to reach, but OT performed for thoroughness. Pt in adult diaper that family brought from home, family educated in risk of skin breakdown     Functional mobility during ADLs: Moderate assistance;Rolling walker;+2 for safety/equipment General ADL Comments: very slow processing time  Vision Baseline Vision/History: Wears glasses Patient Visual Report: No change from baseline Additional Comments: Visual Assessment not conducted at this time.       Perception     Praxis      Pertinent Vitals/Pain Pain Assessment: No/denies pain     Hand Dominance Right   Extremity/Trunk Assessment Upper Extremity Assessment Upper Extremity Assessment: Generalized weakness   Lower Extremity Assessment Lower Extremity Assessment: Defer to PT evaluation   Cervical / Trunk Assessment Cervical / Trunk Assessment: Kyphotic   Communication Communication Communication: No difficulties   Cognition Arousal/Alertness: Awake/alert Behavior During Therapy: WFL for tasks assessed/performed;Flat affect Overall Cognitive Status: Impaired/Different from baseline Area of Impairment: Attention;Following commands;Awareness;Problem solving;Safety/judgement                   Current Attention Level: Sustained   Following Commands: Follows one step commands with increased time Safety/Judgement: Decreased awareness of deficits Awareness: Emergent Problem Solving: Slow processing;Decreased initiation;Requires verbal cues;Requires tactile cues     General Comments  Pt's 2 nieces present for session who are Pt's POAs    Exercises     Shoulder Instructions      Home Living Family/patient expects to be discharged to:: Private residence Living Arrangements: Other relatives (sister with Alzheimers) Available Help at Discharge: Family;Other (Comment) (sister has 24 hour care that helps her as well) Type of Home: House Home Access: Stairs to enter [employer] of Steps: 2 Entrance Stairs-Rails: Right Home Layout: Laundry or work area in basement;Two level Alternate Level Stairs-Number of Steps: flight Alternate Level Stairs-Rails: Can reach both Bathroom Shower/Tub: Teacher, early years/pre: Standard     Home Equipment: Tub bench;Hand held shower head;Grab bars - tub/shower;Bedside commode;Walker - 2 wheels      Lives With: Other (Comment) (sister)    Prior Functioning/Environment Level of Independence: Independent         Comments: does not drive but manages medication and balancing check book        OT Problem List: Decreased strength;Decreased activity tolerance;Impaired balance (sitting and/or standing);Decreased cognition;Decreased safety awareness;Decreased knowledge of use of DME or AE      OT Treatment/Interventions: Self-care/ADL training;Energy conservation;DME and/or AE instruction;Therapeutic activities;Cognitive remediation/compensation;Patient/family education;Balance training    OT Goals(Current goals can be found in the care plan section) Acute Rehab OT Goals Patient Stated Goal: to get home to sister OT Goal Formulation: With patient/family Time For Goal Achievement: 08/30/16 Potential to Achieve Goals: Fair ADL Goals Pt Will Perform Upper Body Bathing: with modified independence;with adaptive equipment;sitting Pt Will Perform Lower Body Bathing: with min guard assist;with adaptive equipment;sitting/lateral leans Pt Will Transfer to Toilet: with min guard assist;ambulating;regular height toilet (with RW) Pt Will Perform Toileting - Clothing Manipulation and hygiene: with min guard assist;sit to/from stand Additional ADL Goal #1: Pt will follow 2 step commands during ADL with no more than 3 verbal cues  OT Frequency: Min 2X/week   Barriers to D/C:            Co-evaluation PT/OT/SLP Co-Evaluation/Treatment: Yes Reason for Co-Treatment: Necessary to address cognition/behavior during functional activity;For patient/therapist safety;To address functional/ADL transfers   OT goals addressed during session: ADL's and self-care      End of Session Equipment Utilized During Treatment: Gait belt;Rolling walker Nurse Communication: Mobility status  Activity Tolerance: Patient limited by fatigue Patient left: in chair;with chair alarm set;with call bell/phone within reach;with family/visitor present  OT Visit Diagnosis: Unsteadiness on feet (R26.81);Other abnormalities of gait  and mobility (R26.89);Muscle weakness (generalized) (  M62.81);Other symptoms and signs involving the nervous system (R29.898)                Time: 0160-1093 OT Time Calculation (min): 33 min Charges:  OT General Charges $OT Visit: 1 Procedure OT Evaluation $OT Eval Moderate Complexity: 1 Procedure G-Codes:     Hulda Humphrey OTR/L [place] 08/16/2016, 5:26 PM

## 2016-08-16 NOTE — Progress Notes (Signed)
[facility]                                                                              Patient Demographics  Lauren [name], is a 83 y.o. female, DOB - 1928-05-05, DUK:[id number]  Admit date - 08/15/2016   Admitting Physician Maren Reamer, MD  Outpatient Primary MD for the patient is [name], MD  Outpatient specialists:   LOS - 1  days    Chief Complaint  Patient presents with  . Weakness  . Shortness of Breath       Brief summary   Lauren [name] is a 83 y.o. female with significant history of a prior TIA on Plavix, diabetes, breast cancer, presented with worsening left-sided weakness that started the day before the admission.  She had noted progressive left-sided weakness, trouble picking up her left leg and getting as the car and difficulty walking while grocery shopping.  Family members stated they watched her overnight to see if she would get better this morning.  They took her to ER b/c she was not getting better, and they noted a left sided facial droop today as well.   Assessment & Plan    Principal Problem: Acute CVA  - Presenting with left-sided weakness, left facial drooping - MRI of the brain showed acute distal right anterior cerebral artery territory infarction primarily involving the corpus callosum - MRA negative for emergent large vessel occlusion - Follow 2-D echo, carotid Dopplers - PT OT, speech evaluation - Passed swallow evaluation, - Neurology following him and recommended to continue Plavix at this time - LDL 71, continue statin -  follow hemoglobin A1c  Active problems Hypertension - was diet controlled, not on antihypertensives on home meds - permissive htn for now  Diabetes mellitus - hold metformin - Follow hemoglobin A1c, continue sliding scale insulin for now  Hyperlipidemia - LDL 71 - continue simvastatin 20 qhs   Anxiety/depression - continue celexa 5mg  qd   gerd - continue ppi 40 qd and pepcid  (for zantac)   Code Status: DO NOT RESUSCITATE DVT Prophylaxis:  SCD's Family Communication: Discussed in detail with the patient, all imaging results, lab results explained to the patient  Disposition Plan:   Time Spent in minutes   25 minutes  Procedures:    Consultants:   neuro  Antimicrobials:      Medications  Scheduled Meds: . citalopram  5 mg Oral Daily  . clopidogrel  75 mg Oral Daily  . heparin  5,000 Units Subcutaneous BID  . insulin aspart  0-9 Units Subcutaneous TID WC  . omega-3 acid ethyl esters  1 g Oral Daily  . pantoprazole  40 mg Oral Daily  . simvastatin  20 mg Oral QHS   Continuous Infusions: PRN Meds:.acetaminophen **OR** acetaminophen (TYLENOL) oral liquid 160 mg/5 mL **OR** acetaminophen, acetaminophen, calcium carbonate, hydroxypropyl methylcellulose / hypromellose, senna-docusate, simethicone   Antibiotics   Anti-infectives    None        Subjective:   Lauren [name] was seen and examined today.  Still complaining of left leg weakness. Eating without difficulty. Patient denies dizziness, chest pain, shortness of  breath, abdominal pain, N/V/D/C. No acute events overnight.    Objective:   Vitals:   08/16/16 0535 08/16/16 0732 08/16/16 0800 08/16/16 1000  BP: (!) 147/70 (!) 144/79 136/63 (!) 173/78  Pulse: 74 79  82  Resp: 20 18 18 18   Temp: 98.4 F (36.9 C) 98.4 F (36.9 C)    TempSrc: Oral Oral    SpO2: 96% 97% 97% 96%  Weight:      Height:        Intake/Output Summary (Last 24 hours) at 08/16/16 1112 Last data filed at 08/16/16 0904  Gross per 24 hour  Intake              440 ml  Output                0 ml  Net              440 ml     Wt Readings from Last 3 Encounters:  08/15/16 55.1 kg (121 lb 6.4 oz)  07/17/16 54.7 kg (120 lb 8 oz)  06/13/16 54.4 kg (120 lb)     Exam  General: Alert and oriented x 3, NAD, Mild dysarthria, mild delay noted  HEENT:    Neck: Supple, no JVD, no masses  Cardiovascular: S1  S2 auscultated, no rubs, murmurs or gallops. Regular rate and rhythm.  Respiratory: Clear to auscultation bilaterally, no wheezing, rales or rhonchi  Gastrointestinal: Soft, nontender, nondistended, + bowel sounds  Ext: no cyanosis clubbing or edema  Neuro: AAOx3, Cr N's II- XII. Strength 5/5 upper and lower extremities on the right, mild weakness in the left leg 4/5. Left arm 5/5   Skin: No rashes  Psych: Normal affect and demeanor, alert and oriented x3    Data Reviewed:  I have personally reviewed following labs and imaging studies  Micro Results No results found for this or any previous visit (from the past 240 hour(s)).  Radiology Reports Dg Chest 2 View  Result Date: 08/15/2016 CLINICAL DATA:  Left facial droop, left-sided weakness. EXAM: CHEST  2 VIEW COMPARISON:  Radiographs of May 01, 2014. FINDINGS: The heart size and mediastinal contours are within normal limits. Both lungs are clear. Stable elevated left hemidiaphragm is noted. No pneumothorax or pleural effusion is noted. The visualized skeletal structures are unremarkable. IMPRESSION: No active cardiopulmonary disease. Electronically Signed   By: Marijo Conception, M.D.   On: 08/15/2016 15:36   Mr Brain Wo Contrast  Result Date: 08/15/2016 CLINICAL DATA:  Patient states RIGHT leg pain. Difficulty getting up. EXAM: MRI HEAD WITHOUT CONTRAST TECHNIQUE: Multiplanar, multiecho pulse sequences of the brain and surrounding structures were obtained without intravenous contrast. COMPARISON:  CT head 06/13/2016. MR head 02/29/2016. MRA head 02/29/2016. FINDINGS: Brain: Acute infarction, RIGHT ACA territory, primarily involving the RIGHT-sided genu and body of corpus callosum. Medial RIGHT frontal cortex and cingulate gyrus not significantly affected. No hemorrhage, mass lesion, hydrocephalus, or extra-axial fluid. Global atrophy. Moderately advanced chronic microvascular ischemic change. Vascular: Normal flow voids. Proximally, the  RIGHT ACA is patent. Tiny focus chronic hemorrhage, LEFT centrum semiovale. Skull and upper cervical spine: Unremarkable. Sinuses/Orbits: Negative.  BILATERAL cataract extraction Other: None. IMPRESSION: Acute distal RIGHT anterior cerebral artery territory infarction, primarily involving the corpus callosum. Atrophy and small vessel disease, similar to priors. Electronically Signed   By: Staci Righter M.D.   On: 08/15/2016 13:29   Mr Jodene Nam Head/brain LT Cm  Result Date: 08/15/2016 CLINICAL DATA:  83 year old female found have a  distal right ACA territory acute infarct on MRI today performed for lower extremity pain and weakness. EXAM: MRA HEAD WITHOUT CONTRAST TECHNIQUE: Angiographic images of the Circle of Willis were obtained using MRA technique without intravenous contrast. COMPARISON:  Brain MRI 1232 hours today. Previous brain MRI and intracranial MRA 02/29/2016 FINDINGS: Stable antegrade flow in the posterior circulation with no distal vertebral artery stenosis. Normal PICA origins, the left is early. Normal vertebrobasilar junction. No basilar stenosis. Normal SCA and PCA origins. Bilateral PCA branches are stable with only mild right PCA P2 segment stenosis. Stable antegrade flow in both ICA siphons. Chronic tortuosity of the distal cervical right ICA again noted. Bilateral distal cavernous and supraclinoid ICA irregularity with mild left ICA siphon stenosis is stable. Carotid termini remain patent. MCA and ACA origins remain patent. Dominant right A1 segment. Anterior communicating artery remains patent. There is a new high-grade stenosis in the left ACA mid A2 segment, but there is no right ACA stenosis or occlusion identified. See series 403, image 7. There is preserved distal left ACA flow. Bilateral MCA M1 segments, MCA bifurcations, and visible bilateral MCA branches are stable. IMPRESSION: 1. Negative for emergent large vessel occlusion. There is no visible right ACA stenosis or occlusion, although  there is a new high-grade left ACA 8 mid a 2 segment stenosis since October 2017. 2. Otherwise stable intracranial MRA since 2017, including mild left ICA siphon and right PCA stenosis. Electronically Signed   By: Genevie Ann M.D.   On: 08/15/2016 16:16    Lab Data:  CBC:  Recent Labs Lab 08/15/16 1105 08/15/16 1649  WBC 9.8 11.5*  NEUTROABS 7.0  --   HGB 13.9 13.3  HCT 42.4 40.4  MCV 95.9 96.0  PLT 188 166   Basic Metabolic Panel:  Recent Labs Lab 08/15/16 1105 08/15/16 1649  NA 139  --   K 4.3  --   CL 106  --   CO2 24  --   GLUCOSE 182*  --   BUN 14  --   CREATININE 0.76 0.73  CALCIUM 9.8  --    GFR: Estimated Creatinine Clearance: 39.9 mL/min (by C-G formula based on SCr of 0.73 mg/dL). Liver Function Tests:  Recent Labs Lab 08/15/16 1105  AST 22  ALT 13*  ALKPHOS 60  BILITOT 0.5  PROT 6.6  ALBUMIN 3.5   No results for input(s): LIPASE, AMYLASE in the last 168 hours. No results for input(s): AMMONIA in the last 168 hours. Coagulation Profile:  Recent Labs Lab 08/15/16 1105  INR 1.05   Cardiac Enzymes: No results for input(s): CKTOTAL, CKMB, CKMBINDEX, TROPONINI in the last 168 hours. BNP (last 3 results) No results for input(s): PROBNP in the last 8760 hours. HbA1C: No results for input(s): HGBA1C in the last 72 hours. CBG:  Recent Labs Lab 08/15/16 1927 08/15/16 2137 08/16/16 0633  GLUCAP 102* 286* 100*   Lipid Profile:  Recent Labs  08/16/16 0434  CHOL 135  HDL 34*  LDLCALC 71  TRIG 152*  CHOLHDL 4.0   Thyroid Function Tests:  Recent Labs  08/15/16 1649  TSH 3.930   Anemia Panel: No results for input(s): VITAMINB12, FOLATE, FERRITIN, TIBC, IRON, RETICCTPCT in the last 72 hours. Urine analysis:    Component Value Date/Time   COLORURINE YELLOW 08/15/2016 1200   APPEARANCEUR CLEAR 08/15/2016 1200   LABSPEC 1.008 08/15/2016 1200   PHURINE 6.0 08/15/2016 1200   GLUCOSEU NEGATIVE 08/15/2016 1200   HGBUR NEGATIVE 08/15/2016  1200  BILIRUBINUR NEGATIVE 08/15/2016 1200   KETONESUR NEGATIVE 08/15/2016 1200   PROTEINUR NEGATIVE 08/15/2016 1200   UROBILINOGEN 0.2 01/12/2013 0132   NITRITE NEGATIVE 08/15/2016 1200   LEUKOCYTESUR NEGATIVE 08/15/2016 1200     Lauren [name] M.D. [facility] 08/16/2016, 11:12 AM  Pager: [phone] Between 7am to 7pm - call Pager - [phone]  After 7pm go to [email or web address] - password [email or web address]  Call night coverage person covering after 7pm

## 2016-08-16 NOTE — Progress Notes (Signed)
STROKE TEAM PROGRESS NOTE   HISTORY OF PRESENT ILLNESS (per record) This is an 83 year old right-handed woman who presented to the ED complaining of some leg weakness and difficulty standing up today. MRI scan of the brain was performed in emergency department showed an acute right anterior cerebral artery infarction. Neurology consultation is now requested for further recommendations.  History is obtained from patient and from her niece was present at the bedside. The patient reports that she first noticed difficulty walking yesterday when she was at the store with her niece. She has trouble telling me exactly what problems she was having with her walking. She says that she didn't appreciate any weakness though her niece indicates that her left leg was weak. Symptoms persisted today and she presented to the emergency department for further evaluation.  They report that she has had a history of multiple TIAs in the past. Patient reports that these usually consist of speech changes. She has been taking Plavix at home. She denies missing any doses recently.  Last known well: 08/14/16 tPA given?: No, clearly outside of window was completed infarction on MRI scan   SUBJECTIVE (INTERVAL HISTORY) Her niece, nephew and nephew's wife are at the bedside. Pt sitting in chair, answered all questions appropriately but slow on reaction. Left leg weakness improved.   OBJECTIVE Temp:  [98.1 F (36.7 C)-98.6 F (37 C)] 98.4 F (36.9 C) (03/25 0732) Pulse Rate:  [66-114] 82 (03/25 1000) Cardiac Rhythm: Normal sinus rhythm (03/25 0700) Resp:  [18-28] 18 (03/25 1000) BP: (116-175)/(46-88) 173/78 (03/25 1000) SpO2:  [92 %-97 %] 96 % (03/25 1000) Weight:  [55.1 kg (121 lb 6.4 oz)] 55.1 kg (121 lb 6.4 oz) (03/24 1948)  CBC:   Recent Labs Lab 08/15/16 1105 08/15/16 1649  WBC 9.8 11.5*  NEUTROABS 7.0  --   HGB 13.9 13.3  HCT 42.4 40.4  MCV 95.9 96.0  PLT 188 097    Basic Metabolic Panel:    Recent Labs Lab 08/15/16 1105 08/15/16 1649  NA 139  --   K 4.3  --   CL 106  --   CO2 24  --   GLUCOSE 182*  --   BUN 14  --   CREATININE 0.76 0.73  CALCIUM 9.8  --     Lipid Panel:     Component Value Date/Time   CHOL 135 08/16/2016 0434   TRIG 152 (H) 08/16/2016 0434   HDL 34 (L) 08/16/2016 0434   CHOLHDL 4.0 08/16/2016 0434   VLDL 30 08/16/2016 0434   LDLCALC 71 08/16/2016 0434   HgbA1c:  Lab Results  Component Value Date   HGBA1C 6.9 (H) 05/13/2016   Urine Drug Screen:     Component Value Date/Time   LABOPIA NONE DETECTED 08/15/2016 1200   COCAINSCRNUR NONE DETECTED 08/15/2016 1200   LABBENZ NONE DETECTED 08/15/2016 1200   AMPHETMU NONE DETECTED 08/15/2016 1200   THCU NONE DETECTED 08/15/2016 1200   LABBARB NONE DETECTED 08/15/2016 1200      IMAGING I have personally reviewed the radiological images below and agree with the radiology interpretations.  Dg Chest 2 View 08/15/2016 No active cardiopulmonary disease.   Mr Brain Wo Contrast 08/15/2016 Acute distal RIGHT anterior cerebral artery territory infarction, primarily involving the corpus callosum. Atrophy and small vessel disease, similar to priors.   Mr Jodene Nam Head/brain Wo Cm 08/15/2016 1. Negative for emergent large vessel occlusion. There is no visible right ACA stenosis or occlusion, although there is a new high-grade left  ACA 8 mid a 2 segment stenosis since October 2017. 2. Otherwise stable intracranial MRA since 2017, including mild left ICA siphon and right PCA stenosis.   CUS - Bilateral: intimal wall thickening CCA. Mild mixed plaque origin and proximal ICA and ECA. 1-39% ICA plaquing. Vertebral artery flow is antegrade.  TTE - Left ventricle: The cavity size was normal. Wall thickness was   normal. Systolic function was vigorous. The estimated ejection   fraction was in the range of 65% to 70%. Wall motion was normal;   there were no regional wall motion abnormalities. Doppler    parameters are consistent with abnormal left ventricular   relaxation (grade 1 diastolic dysfunction). - Aortic valve: Mildly calcified annulus. Trileaflet; mildly   thickened leaflets. There was mild regurgitation. Valve area   (VTI): 1.75 cm^2. Valve area (Vmax): 1.43 cm^2. Valve area   (Vmean): 1.39 cm^2. - Mitral valve: Mildly calcified annulus. Mildly thickened leaflets - Left atrium: The atrium was mildly to moderately dilated.   PHYSICAL EXAM  Temp:  [98.1 F (36.7 C)-98.6 F (37 C)] 98.4 F (36.9 C) (03/25 0732) Pulse Rate:  [73-114] 82 (03/25 1000) Resp:  [18-23] 18 (03/25 1000) BP: (116-173)/(46-83) 173/78 (03/25 1000) SpO2:  [94 %-97 %] 96 % (03/25 1000) Weight:  [121 lb 6.4 oz (55.1 kg)] 121 lb 6.4 oz (55.1 kg) (03/24 1948)  General - Well nourished, well developed, in no apparent distress.  Ophthalmologic - Fundi not visualized due to noncooperation.  Cardiovascular - Regular rate and rhythm.  Mental Status -  Level of arousal and orientation to time, place, and person were intact. Language including expression, naming, repetition, comprehension was assessed and found intact. Attention span and concentration were normal. However, mildly slow on reaction Fund of Knowledge was assessed and was intact.  Cranial Nerves II - XII - II - Visual field intact OU. III, IV, VI - Extraocular movements intact. V - Facial sensation intact bilaterally. VII - Facial movement intact bilaterally. VIII - Hearing & vestibular intact bilaterally. X - Palate elevates symmetrically. XI - Chin turning & shoulder shrug intact bilaterally. XII - Tongue protrusion intact.  Motor Strength - The patient's strength was normal in all extremities except left LE knee extension 4/5 and pronator drift was absent.  Bulk was normal and fasciculations were absent.   Motor Tone - Muscle tone was assessed at the neck and appendages and was normal.  Reflexes - The patient's reflexes were 1+ in all  extremities and she had no pathological reflexes.  Sensory - Light touch, temperature/pinprick were assessed and were symmetrical.    Coordination - The patient had normal movements in the hands with no ataxia or dysmetria but slow on motion.  Tremor was absent.  Gait and Station - deferred to PT   ASSESSMENT/PLAN Ms. Lauren [name] is a 83 y.o. female with history of previous TIAs, hyperlipidemia, diabetes mellitus, breast cancer, melanoma left upper extremity, and chronic headaches presenting with left leg weakness. She did not receive IV t-PA due to late presentation.  Stroke: Right ACA territory infarction, concerning for embolic source, but pt does have large vessel athero including right cavernous ICA but not right ACA.  Resultant  Mild left LE weakness  MRI - Acute distal RIGHT ACA infarction  MRA - Neg for emergent LVO - diffuse athero including b/l cavernous ICA, b/l PCAs and new high-grade left ACA mid segment stenosis.  Carotid Doppler - unremarkable  2D Echo - unremarkable  30 day cardiac event  monitoring in 03/2016 no afib. However, will discuss with cardiology in am to see if they are able to implant loop recorder for longer monitoring without needing TEE (pt family refused TEE due to high sensitivity of gag/cough reflex)  LDL - 71  HgbA1c - pending  VTE prophylaxis - subcutaneous heparin Diet heart healthy/carb modified Room service appropriate? Yes; Fluid consistency: Thin  clopidogrel 75 mg daily prior to admission, now on clopidogrel 75 mg daily.  Patient counseled to be compliant with her antithrombotic medications  Ongoing aggressive stroke risk factor management  Therapy recommendations: SNF  Disposition: Pending  Hx of TIA  03/02/16 - speech difficulty - MRI neg for stroke, EEG negative - MRA b/l cavernous ICA stenosis, b/l PCA stenosis - EF 65-70%, LDL 55, A1C 6.6 - discharged with plavix  Pt had similar episode in  2014  Hypertension  Stable  Permissive hypertension (OK if < 220/120) but gradually normalize in 5-7 days  Long-term BP goal normotensive  Hyperlipidemia  Home meds:  Zocor 20 mg daily resumed in hospital  LDL 71, goal < 70  Continue statin at discharge  Diabetes  HgbA1c pending, goal < 7.0  Uncontrolled  CBG high at times  SSI  Other Stroke Risk Factors  Advanced age  Family hx stroke (both parents, 2 brothers, and 3 sisters)  Other Active Problems  Mild leukocytosis - 11.5 - afebrile  Left breast cancer - cured  Melanoma of skin - cured  Follows with Dr. Rexene Alberts at [facility] - next appointment 10/12/16  Hospital day # 1  Rosalin Hawking, MD PhD Stroke Neurology 08/16/2016 5:27 PM    To contact Stroke Continuity provider, please refer to [email or web address] After hours, contact General Neurology

## 2016-08-16 NOTE — Evaluation (Signed)
Speech Language Pathology Evaluation Patient Details Name: Lauren [name] MRN: [id number] DOB: [birth year] Today's Date: 08/16/2016 Time: 4854-6270 SLP Time Calculation (min) (ACUTE ONLY): 30 min  Problem List:  Patient Active Problem List   Diagnosis Date Noted  . Acute CVA (cerebrovascular accident) ([place]) 08/15/2016  . HTN (hypertension) 08/15/2016  . TIA (transient ischemic attack) 02/28/2016  . GERD (gastroesophageal reflux disease) 08/06/2015  . Hearing loss 08/06/2015  . Estrogen deficiency 05/01/2014  . Viral URI with cough 04/02/2014  . Left knee pain 09/22/2013  . Chest pain, musculoskeletal 08/05/2013  . Encounter for Medicare annual wellness exam 04/04/2013  . Colon cancer screening 04/04/2013  . History of melanoma excision 09/27/2012  . History of breast cancer, left 01/08/2011  . History of pelvic fracture 09/17/2010  . DEGENERATIVE JOINT DISEASE 02/13/2010  . Migraine variant 11/06/2009  . Osteopenia 10/18/2008  . Breast cancer, left breast ([place]) 09/10/2008  . Transient ischemic attack 09/10/2008  . Diabetes type 2, controlled ([place]) 10/06/2007  . VENOUS INSUFFICIENCY 10/06/2007  . Diverticulosis of colon 10/06/2007  . FIBROCYSTIC BREAST DISEASE 10/06/2007  . HYPERCHOLESTEROLEMIA, PURE 11/29/2006   Past Medical History:  Past Medical History:  Diagnosis Date  . Breast cancer ([place])    left breast  . Chronic constipation   . Chronic headaches   . Diabetes mellitus   . Fibrocystic breast   . Gastritis   . Gout    injection in the past  . Hyperlipidemia   . Melanoma in situ of left upper extremity ([place])   . Pelvic fracture ([place])   . TIA (transient ischemic attack) 08/2008   neg MRI/MRA and CT and carotid dopplers  . Venous insufficiency    Past Surgical History:  Past Surgical History:  Procedure Laterality Date  . APPENDECTOMY    . BREAST CYST ASPIRATION    . BREAST LUMPECTOMY  09/2007   with sentinel LN biopsy  . BREAST LUMPECTOMY     left  .  EYE SURGERY     retinal, then cataract   HPI:  TYFFANI FOGLESONG a 83 y.o.femalewith significant history of a prior TIA on Plavix, diabetes, breast cancer, presentedwith worsening left-sided weakness that started the day before the admission.Found to have acute CVA; MRI of the brain showed acute distal right anterior cerebral artery territory infarction primarily involving the corpus callosum. No prior cognitive-linguistic evalutions in chart.   Assessment / Plan / Recommendation Clinical Impression  Patient presents with mild cognitive communication deficit, requiring extended processing time for all tasks and many verbal responses. Auditory comprehension, verbal expression appear within functional limits; patient answers all questions appropriately given additional time. Her family reports that at baseline she likes to "gather her thoughts," however this has increased since her hospitalization. Per patient and niece, who lives across the street, patient lives with her 70 year old sister who has Alzheimer's disease. Caregiver present in the home 24/7, who is responsible for all cooking and cleaning. Patient was managing her own finances in addition to her sister's, as well as her own medications. Initially began administering MMSE; approximately halfway through the test it became clear the patient had memorized the test (she admitted she has seen it administered several times to her sister). Administered MoCA form 7.1, patient scored 23/30 indicating mild impairment. Several interruptions occurred during the test, which may have impacted delayed recall (1/5, though patient recalls with 100% accuracy with category cue). Patient is very thoughtful and methodical during testing, attempts to self monitor but is not  always able to self-correct. OT reports pt with poor initiation, sequencing of functional hygiene task. Recommending SNF placement for safety, pt would benefit from skilled ST services there to  address cognitive communication impairments, particularly sequencing, problem solving and safety awareness. Family in agreement with this plan. No further acute SLP needs identified, as all needs can be addressed in next level of care.     SLP Assessment  SLP Recommendation/Assessment: All further Speech Lanaguage Pathology  needs can be addressed in the next venue of care SLP Visit Diagnosis: Cognitive communication deficit (R41.841)    Follow Up Recommendations  Skilled Nursing facility    Frequency and Duration           SLP Evaluation Cognition  Overall Cognitive Status: Impaired/Different from baseline Arousal/Alertness: Awake/alert Orientation Level: Oriented X4 Attention: Focused;Sustained;Selective Focused Attention: Appears intact Sustained Attention: Appears intact Selective Attention: Appears intact Memory: Impaired Memory Impairment: Decreased recall of new information Awareness: Appears intact Problem Solving: Impaired Problem Solving Impairment: Verbal basic;Functional basic Executive Function: Organizing;Sequencing;Initiating Sequencing: Impaired Sequencing Impairment: Verbal basic;Functional basic Organizing: Impaired Organizing Impairment: Verbal basic;Functional basic Initiating: Impaired Initiating Impairment: Verbal basic;Functional basic Safety/Judgment: Impaired       Comprehension  Auditory Comprehension Overall Auditory Comprehension: Appears within functional limits for tasks assessed Yes/No Questions: Within Functional Limits Commands: Within Functional Limits Conversation: Simple Interfering Components: Processing speed Visual Recognition/Discrimination Discrimination: Within Function Limits Reading Comprehension Reading Status: Not tested    Expression Expression Primary Mode of Expression: Verbal Verbal Expression Overall Verbal Expression: Appears within functional limits for tasks assessed Initiation: Impaired (delayed; requires  extended processing time) Automatic Speech: Name;Social Response Level of Generative/Spontaneous Verbalization: Conversation Repetition: No impairment Naming: No impairment Pragmatics: No impairment Non-Verbal Means of Communication: Not applicable Written Expression Dominant Hand: Right Written Expression: Not tested   Oral / Motor  Oral Motor/Sensory Function Overall Oral Motor/Sensory Function: Mild impairment Facial ROM: Reduced left Facial Symmetry: Abnormal symmetry left Facial Strength: Reduced left Facial Sensation: Within Functional Limits Lingual ROM: Within Functional Limits Lingual Symmetry: Within Functional Limits Lingual Strength: Within Functional Limits Lingual Sensation: Within Functional Limits Velum: Within Functional Limits Mandible: Within Functional Limits Motor Speech Overall Motor Speech: Appears within functional limits for tasks assessed Respiration: Within functional limits Phonation: Low vocal intensity Resonance: Within functional limits Articulation: Within functional limitis Intelligibility: Intelligible Motor Planning: Witnin functional limits Motor Speech Errors: Not applicable   [place], [place] CF-SLP Speech-Language Pathologist [phone]  Aliene Altes 08/16/2016, 4:38 PM

## 2016-08-16 NOTE — Evaluation (Signed)
Physical Therapy Evaluation Patient Details Name: Lauren [name] MRN: [id number] DOB: [birth year] Today's Date: 08/16/2016   History of Present Illness  This is an 83 year old right-handed woman who presented to the ED complaining of some leg weakness and difficulty standing up today. MRI scan of the brain was performed in emergency department showed an acute right anterior cerebral artery infarction;  has a past medical history of Breast cancer ([place]); Chronic constipation; Chronic headaches; Diabetes mellitus; Fibrocystic breast; Gastritis; Gout; Hyperlipidemia; Melanoma in situ of left upper extremity ([place]); Pelvic fracture ([facility]); TIA (transient ischemic attack) (08/2008)   Clinical Impression  Pt admitted with above diagnosis. Pt currently with functional limitations due to the deficits listed below (see PT Problem List). Moving very slowly and is unsteady on her feet; recommend SNF for post-acute rehab to maximize independence and safety with mobility;  Pt will benefit from skilled PT to increase their independence and safety with mobility to allow discharge to the venue listed below.       Follow Up Recommendations SNF;Other (comment) (Family prefers [facility])    Clinical biochemist with 5" wheels;3in1 (PT)    Recommendations for Other Services       Precautions / Restrictions Precautions Precautions: Fall Restrictions Weight Bearing Restrictions: No      Mobility  Bed Mobility               General bed mobility comments: Not assessed this session. received in bathroom, and went to chair  Transfers Overall transfer level: Needs assistance Equipment used: Rolling walker (2 wheeled) Transfers: Sit to/from Stand Sit to Stand: Mod assist         General transfer comment: Use of gait belt to assist with power up from [facility], and strong posterior lean  Ambulation/Gait Ambulation/Gait assistance: Mod assist Ambulation Distance (Feet): 12  Feet Assistive device: Rolling walker (2 wheeled) Gait Pattern/deviations: Step-to pattern;Trunk flexed     General Gait Details: Extremely slow moving, and very dependent on RW for support and steadiness  Stairs            Wheelchair Mobility    Modified Rankin (Stroke Patients Only) Modified Rankin (Stroke Patients Only) Pre-Morbid Rankin Score: No symptoms Modified Rankin: Moderately severe disability     Balance Overall balance assessment: Needs assistance Sitting-balance support: Bilateral upper extremity supported;Feet supported Sitting balance-Leahy Scale: Fair   Postural control: Posterior lean Standing balance support: Bilateral upper extremity supported;During functional activity Standing balance-Leahy Scale: Poor                               Pertinent Vitals/Pain Pain Assessment: No/denies pain    Home Living Family/patient expects to be discharged to:: Private residence Living Arrangements: Other relatives (sister with Alzheimers) Available Help at Discharge: Family;Other (Comment) (sister has 24 hour care that helps her as well) Type of Home: House Home Access: Stairs to enter Entrance Stairs-Rails: Right Entrance Stairs-Number of Steps: 2 Home Layout: Laundry or work area in basement;Two level Home Equipment: Tub bench;Hand held shower head;Grab bars - tub/shower;Bedside commode;Walker - 2 wheels      Prior Function Level of Independence: Independent         Comments: does not drive but manages medication and balancing check book     Hand Dominance   Dominant Hand: Right    Extremity/Trunk Assessment   Upper Extremity Assessment Upper Extremity Assessment: Defer to OT evaluation    Lower Extremity Assessment  Lower Extremity Assessment: Generalized weakness (Gross Assessment)    Cervical / Trunk Assessment Cervical / Trunk Assessment: Kyphotic  Communication   Communication: No difficulties  Cognition  Arousal/Alertness: Awake/alert Behavior During Therapy: WFL for tasks assessed/performed;Flat affect Overall Cognitive Status: Impaired/Different from baseline Area of Impairment: Attention;Following commands;Awareness;Problem solving;Safety/judgement                   Current Attention Level: Sustained   Following Commands: Follows one step commands with increased time Safety/Judgement: Decreased awareness of deficits Awareness: Emergent Problem Solving: Slow processing;Decreased initiation;Requires verbal cues;Requires tactile cues        General Comments General comments (skin integrity, edema, etc.): Family present during session    Exercises     Assessment/Plan    PT Assessment Patient needs continued PT services  PT Problem List Decreased strength;Decreased range of motion;Decreased activity tolerance;Decreased balance;Decreased mobility;Decreased coordination;Decreased cognition;Decreased knowledge of use of DME;Decreased safety awareness;Decreased knowledge of precautions       PT Treatment Interventions DME instruction;Gait training;Functional mobility training;Therapeutic activities;Therapeutic exercise;Balance training;Neuromuscular re-education;Cognitive remediation    PT Goals (Current goals can be found in the Care Plan section)  Acute Rehab PT Goals Patient Stated Goal: to get home to sister PT Goal Formulation: With patient Time For Goal Achievement: 08/30/16 Potential to Achieve Goals: Good    Frequency Min 4X/week   Barriers to discharge        Co-evaluation PT/OT/SLP Co-Evaluation/Treatment: Yes Reason for Co-Treatment: Necessary to address cognition/behavior during functional activity;For patient/therapist safety PT goals addressed during session: Mobility/safety with mobility OT goals addressed during session: ADL's and self-care       End of Session Equipment Utilized During Treatment: Gait belt Activity Tolerance: Patient tolerated  treatment well Patient left: in chair;with call bell/phone within reach;with chair alarm set Nurse Communication: Mobility status PT Visit Diagnosis: Unsteadiness on feet (R26.81)    Time: 2831-5176 PT Time Calculation (min) (ACUTE ONLY): 24 min   Charges:   PT Evaluation $PT Eval Moderate Complexity: 1 Procedure     PT G Codes:        Roney Marion, PT  Acute Rehabilitation Services Pager [phone] Office [phone]   [place] 08/16/2016, 5:45 PM

## 2016-08-16 NOTE — Progress Notes (Signed)
Echocardiogram 2D Echocardiogram has been performed.  Lauren [name] 08/16/2016, 11:51 AM

## 2016-08-16 NOTE — Progress Notes (Signed)
SLP Cancellation Note  Patient Details Name: Lauren [name] MRN: [id number] DOB: 1928/03/08   Cancelled treatment:       Reason Eval/Treat Not Completed: Patient at procedure or test/unavailable . SLP will f/u as time allows.  Deneise Lever, [place] CF-SLP Speech-Language Pathologist [phone]  Aliene Altes 08/16/2016, 11:57 AM

## 2016-08-17 LAB — GLUCOSE, CAPILLARY
GLUCOSE-CAPILLARY: 127 mg/dL — AB (ref 65–99)
Glucose-Capillary: 155 mg/dL — ABNORMAL HIGH (ref 65–99)
Glucose-Capillary: 123 mg/dL — ABNORMAL HIGH (ref 65–99)

## 2016-08-17 MED ORDER — PANTOPRAZOLE SODIUM 40 MG PO TBEC: 40.0000 mg | DELAYED_RELEASE_TABLET | Freq: Every day | ORAL | 3 refills | Status: DC

## 2016-08-17 NOTE — Progress Notes (Signed)
[facility]                                                                              Patient Demographics  Lauren [name], is a 83 y.o. female, DOB - 1927/05/28, NLZ:[id number]  Admit date - 08/15/2016   Admitting Physician Maren Reamer, MD  Outpatient Primary MD for the patient is [name], MD  Outpatient specialists:   LOS - 2  days    Chief Complaint  Patient presents with  . Weakness  . Shortness of Breath       Brief summary   Lauren [name] is a 83 y.o. female with significant history of a prior TIA on Plavix, diabetes, breast cancer, presented with worsening left-sided weakness that started the day before the admission.  She had noted progressive left-sided weakness, trouble picking up her left leg and getting as the car and difficulty walking while grocery shopping.  Family members stated they watched her overnight to see if she would get better this morning.  They took her to ER b/c she was not getting better, and they noted a left sided facial droop today as well.   Assessment & Plan    Principal Problem: Acute CVA  - Presenting with left-sided weakness, left facial drooping - MRI of the brain showed acute distal right anterior cerebral artery territory infarction primarily involving the corpus callosum - MRA negative for emergent large vessel occlusion - 2-D echo showed EF of 90-24%, grade 1 diastolic dysfunction - Carotid Dopplers showed 1-39% ICA plaquing - PT OT eval recommended skilled nursing facility - Neurology following recommended to continue Plavix at this time - LDL 71, continue statin -  Hemoglobin A1c 6.7  - Neurology recommended loop recorder  Active problems Hypertension - was diet controlled, not on antihypertensives on home meds - permissive htn for now  Diabetes mellitus - hold metformin - Hemoglobin A1c 6.7  continue sliding scale insulin for now  Hyperlipidemia - LDL 71 - continue simvastatin 20  qhs   Anxiety/depression - continue celexa 5mg  qd   gerd - continue ppi 40 qd and pepcid (for zantac)   Code Status: DO NOT RESUSCITATE DVT Prophylaxis:  SCD's Family Communication: Discussed in detail with the patient, all imaging results, lab results explained to the patient  Disposition Plan:   Time Spent in minutes   25 minutes  Procedures:    Consultants:   neuro  Antimicrobials:      Medications  Scheduled Meds: . citalopram  5 mg Oral Daily  . clopidogrel  75 mg Oral Daily  . heparin  5,000 Units Subcutaneous BID  . insulin aspart  0-9 Units Subcutaneous TID WC  . omega-3 acid ethyl esters  1 g Oral Daily  . pantoprazole  40 mg Oral Daily  . simvastatin  20 mg Oral QHS   Continuous Infusions: PRN Meds:.acetaminophen **OR** acetaminophen (TYLENOL) oral liquid 160 mg/5 mL **OR** acetaminophen, acetaminophen, calcium carbonate, hydroxypropyl methylcellulose / hypromellose, senna-docusate, simethicone   Antibiotics   Anti-infectives    None        Subjective:   Lauren [name] was seen and examined today.  No complaints, feeling somewhat better today left leg weakness improving, Patient denies dizziness, chest pain, shortness of breath, abdominal pain, N/V/D/C. No acute events overnight.    Objective:   Vitals:   08/16/16 2129 08/17/16 0150 08/17/16 0635 08/17/16 0957  BP: 129/64 (!) 135/56 (!) 144/53 (!) 131/56  Pulse: 80 86 75 89  Resp: 16 16 18 18   Temp: 98 F (36.7 C) 98.3 F (36.8 C) 98 F (36.7 C) 98 F (36.7 C)  TempSrc: Oral Oral Oral Oral  SpO2: 94% 93% 95% 98%  Weight:      Height:        Intake/Output Summary (Last 24 hours) at 08/17/16 1126 Last data filed at 08/17/16 0500  Gross per 24 hour  Intake              360 ml  Output              200 ml  Net              160 ml     Wt Readings from Last 3 Encounters:  08/15/16 55.1 kg (121 lb 6.4 oz)  07/17/16 54.7 kg (120 lb 8 oz)  06/13/16 54.4 kg (120 lb)      Exam  General: Alert and oriented x 3, NAD, Mild dysarthria, mild delay noted  HEENT:    Neck: Supple, no JVD  Cardiovascular: S1 S2 clear, RRR  Respiratory: CTAB  Gastrointestinal: Soft, nontender, nondistended, + bowel sounds  Ext: no cyanosis clubbing or edema  Neuro:   Skin: No rashes  Psych: Normal affect and demeanor, alert and oriented x3    Data Reviewed:  I have personally reviewed following labs and imaging studies  Micro Results No results found for this or any previous visit (from the past 240 hour(s)).  Radiology Reports Dg Chest 2 View  Result Date: 08/15/2016 CLINICAL DATA:  Left facial droop, left-sided weakness. EXAM: CHEST  2 VIEW COMPARISON:  Radiographs of May 01, 2014. FINDINGS: The heart size and mediastinal contours are within normal limits. Both lungs are clear. Stable elevated left hemidiaphragm is noted. No pneumothorax or pleural effusion is noted. The visualized skeletal structures are unremarkable. IMPRESSION: No active cardiopulmonary disease. Electronically Signed   By: Marijo Conception, M.D.   On: 08/15/2016 15:36   Mr Brain Wo Contrast  Result Date: 08/15/2016 CLINICAL DATA:  Patient states RIGHT leg pain. Difficulty getting up. EXAM: MRI HEAD WITHOUT CONTRAST TECHNIQUE: Multiplanar, multiecho pulse sequences of the brain and surrounding structures were obtained without intravenous contrast. COMPARISON:  CT head 06/13/2016. MR head 02/29/2016. MRA head 02/29/2016. FINDINGS: Brain: Acute infarction, RIGHT ACA territory, primarily involving the RIGHT-sided genu and body of corpus callosum. Medial RIGHT frontal cortex and cingulate gyrus not significantly affected. No hemorrhage, mass lesion, hydrocephalus, or extra-axial fluid. Global atrophy. Moderately advanced chronic microvascular ischemic change. Vascular: Normal flow voids. Proximally, the RIGHT ACA is patent. Tiny focus chronic hemorrhage, LEFT centrum semiovale. Skull and upper  cervical spine: Unremarkable. Sinuses/Orbits: Negative.  BILATERAL cataract extraction Other: None. IMPRESSION: Acute distal RIGHT anterior cerebral artery territory infarction, primarily involving the corpus callosum. Atrophy and small vessel disease, similar to priors. Electronically Signed   By: Staci Righter M.D.   On: 08/15/2016 13:29   Mr Jodene Nam Head/brain VH Cm  Result Date: 08/15/2016 CLINICAL DATA:  83 year old female found have a distal right ACA territory acute infarct on MRI today performed for lower extremity pain and weakness. EXAM: MRA HEAD WITHOUT CONTRAST  TECHNIQUE: Angiographic images of the Circle of Willis were obtained using MRA technique without intravenous contrast. COMPARISON:  Brain MRI 1232 hours today. Previous brain MRI and intracranial MRA 02/29/2016 FINDINGS: Stable antegrade flow in the posterior circulation with no distal vertebral artery stenosis. Normal PICA origins, the left is early. Normal vertebrobasilar junction. No basilar stenosis. Normal SCA and PCA origins. Bilateral PCA branches are stable with only mild right PCA P2 segment stenosis. Stable antegrade flow in both ICA siphons. Chronic tortuosity of the distal cervical right ICA again noted. Bilateral distal cavernous and supraclinoid ICA irregularity with mild left ICA siphon stenosis is stable. Carotid termini remain patent. MCA and ACA origins remain patent. Dominant right A1 segment. Anterior communicating artery remains patent. There is a new high-grade stenosis in the left ACA mid A2 segment, but there is no right ACA stenosis or occlusion identified. See series 403, image 7. There is preserved distal left ACA flow. Bilateral MCA M1 segments, MCA bifurcations, and visible bilateral MCA branches are stable. IMPRESSION: 1. Negative for emergent large vessel occlusion. There is no visible right ACA stenosis or occlusion, although there is a new high-grade left ACA 8 mid a 2 segment stenosis since October 2017. 2.  Otherwise stable intracranial MRA since 2017, including mild left ICA siphon and right PCA stenosis. Electronically Signed   By: Genevie Ann M.D.   On: 08/15/2016 16:16    Lab Data:  CBC:  Recent Labs Lab 08/15/16 1105 08/15/16 1649  WBC 9.8 11.5*  NEUTROABS 7.0  --   HGB 13.9 13.3  HCT 42.4 40.4  MCV 95.9 96.0  PLT 188 938   Basic Metabolic Panel:  Recent Labs Lab 08/15/16 1105 08/15/16 1649  NA 139  --   K 4.3  --   CL 106  --   CO2 24  --   GLUCOSE 182*  --   BUN 14  --   CREATININE 0.76 0.73  CALCIUM 9.8  --    GFR: Estimated Creatinine Clearance: 39.9 mL/min (by C-G formula based on SCr of 0.73 mg/dL). Liver Function Tests:  Recent Labs Lab 08/15/16 1105  AST 22  ALT 13*  ALKPHOS 60  BILITOT 0.5  PROT 6.6  ALBUMIN 3.5   No results for input(s): LIPASE, AMYLASE in the last 168 hours. No results for input(s): AMMONIA in the last 168 hours. Coagulation Profile:  Recent Labs Lab 08/15/16 1105  INR 1.05   Cardiac Enzymes: No results for input(s): CKTOTAL, CKMB, CKMBINDEX, TROPONINI in the last 168 hours. BNP (last 3 results) No results for input(s): PROBNP in the last 8760 hours. HbA1C:  Recent Labs  08/15/16 1649  HGBA1C 6.7*   CBG:  Recent Labs Lab 08/15/16 2137 08/16/16 0633 08/16/16 1628 08/16/16 2134 08/17/16 0618  GLUCAP 286* 100* 128* 151* 127*   Lipid Profile:  Recent Labs  08/16/16 0434  CHOL 135  HDL 34*  LDLCALC 71  TRIG 152*  CHOLHDL 4.0   Thyroid Function Tests:  Recent Labs  08/15/16 1649  TSH 3.930   Anemia Panel: No results for input(s): VITAMINB12, FOLATE, FERRITIN, TIBC, IRON, RETICCTPCT in the last 72 hours. Urine analysis:    Component Value Date/Time   COLORURINE YELLOW 08/15/2016 1200   APPEARANCEUR CLEAR 08/15/2016 1200   LABSPEC 1.008 08/15/2016 1200   PHURINE 6.0 08/15/2016 1200   GLUCOSEU NEGATIVE 08/15/2016 1200   HGBUR NEGATIVE 08/15/2016 1200   BILIRUBINUR NEGATIVE 08/15/2016 1200    KETONESUR NEGATIVE 08/15/2016 1200   PROTEINUR  NEGATIVE 08/15/2016 1200   UROBILINOGEN 0.2 01/12/2013 0132   NITRITE NEGATIVE 08/15/2016 1200   LEUKOCYTESUR NEGATIVE 08/15/2016 1200     Chasidy Janak M.D. [facility] 08/17/2016, 11:26 AM  Pager: [phone] Between 7am to 7pm - call Pager - [phone]  After 7pm go to [email or web address] - password [email or web address]  Call night coverage person covering after 7pm

## 2016-08-17 NOTE — Care Management Note (Signed)
Case Management Note  Patient Details  Name: Lauren [name] MRN: [id number] Date of Birth: January 02, 1928  Subjective/Objective:        Patient was admitted with CVA. Lives at home with her sister with alzheimers, who has 24 hour care.  CM will follow for discharge needs pending therapy evals and physician orders.             Action/Plan:   Expected Discharge Date:                  Expected Discharge Plan:     In-House Referral:     Discharge planning Services     Post Acute Care Choice:    Choice offered to:     DME Arranged:    DME Agency:     HH Arranged:    HH Agency:     Status of Service:     If discussed at [employer] of Stay Meetings, dates discussed:    Additional Comments:  Rolm Baptise, RN 08/17/2016, 10:21 AM

## 2016-08-17 NOTE — Progress Notes (Signed)
STROKE TEAM PROGRESS NOTE   SUBJECTIVE (INTERVAL HISTORY) Her niece is at the bedside. No acute event overnight. Niece discussed with family and they decided not interested in loop recorder at this time.    OBJECTIVE Temp:  [98 F (36.7 C)-98.7 F (37.1 C)] 98 F (36.7 C) (03/26 0957) Pulse Rate:  [75-96] 89 (03/26 0957) Cardiac Rhythm: Normal sinus rhythm (03/26 0700) Resp:  [16-18] 18 (03/26 0957) BP: (121-144)/(53-64) 131/56 (03/26 0957) SpO2:  [93 %-98 %] 98 % (03/26 0957)  CBC:   Recent Labs Lab 08/15/16 1105 08/15/16 1649  WBC 9.8 11.5*  NEUTROABS 7.0  --   HGB 13.9 13.3  HCT 42.4 40.4  MCV 95.9 96.0  PLT 188 373    Basic Metabolic Panel:   Recent Labs Lab 08/15/16 1105 08/15/16 1649  NA 139  --   K 4.3  --   CL 106  --   CO2 24  --   GLUCOSE 182*  --   BUN 14  --   CREATININE 0.76 0.73  CALCIUM 9.8  --     Lipid Panel:     Component Value Date/Time   CHOL 135 08/16/2016 0434   TRIG 152 (H) 08/16/2016 0434   HDL 34 (L) 08/16/2016 0434   CHOLHDL 4.0 08/16/2016 0434   VLDL 30 08/16/2016 0434   LDLCALC 71 08/16/2016 0434   HgbA1c:  Lab Results  Component Value Date   HGBA1C 6.7 (H) 08/15/2016   Urine Drug Screen:     Component Value Date/Time   LABOPIA NONE DETECTED 08/15/2016 1200   COCAINSCRNUR NONE DETECTED 08/15/2016 1200   LABBENZ NONE DETECTED 08/15/2016 1200   AMPHETMU NONE DETECTED 08/15/2016 1200   THCU NONE DETECTED 08/15/2016 1200   LABBARB NONE DETECTED 08/15/2016 1200      IMAGING I have personally reviewed the radiological images below and agree with the radiology interpretations.  Dg Chest 2 View 08/15/2016 No active cardiopulmonary disease.   Mr Brain Wo Contrast 08/15/2016 Acute distal RIGHT anterior cerebral artery territory infarction, primarily involving the corpus callosum. Atrophy and small vessel disease, similar to priors.   Mr Jodene Nam Head/brain Wo Cm 08/15/2016 1. Negative for emergent large vessel occlusion.  There is no visible right ACA stenosis or occlusion, although there is a new high-grade left ACA 8 mid a 2 segment stenosis since October 2017. 2. Otherwise stable intracranial MRA since 2017, including mild left ICA siphon and right PCA stenosis.   CUS - Bilateral: intimal wall thickening CCA. Mild mixed plaque origin and proximal ICA and ECA. 1-39% ICA plaquing. Vertebral artery flow is antegrade.  TTE - Left ventricle: The cavity size was normal. Wall thickness was   normal. Systolic function was vigorous. The estimated ejection   fraction was in the range of 65% to 70%. Wall motion was normal;   there were no regional wall motion abnormalities. Doppler   parameters are consistent with abnormal left ventricular   relaxation (grade 1 diastolic dysfunction). - Aortic valve: Mildly calcified annulus. Trileaflet; mildly   thickened leaflets. There was mild regurgitation. Valve area   (VTI): 1.75 cm^2. Valve area (Vmax): 1.43 cm^2. Valve area   (Vmean): 1.39 cm^2. - Mitral valve: Mildly calcified annulus. Mildly thickened leaflets - Left atrium: The atrium was mildly to moderately dilated.   PHYSICAL EXAM  Temp:  [98 F (36.7 C)-98.7 F (37.1 C)] 98 F (36.7 C) (03/26 0957) Pulse Rate:  [75-96] 89 (03/26 0957) Resp:  [16-18] 18 (03/26 0957) BP: (121-144)/(53-64)  131/56 (03/26 0957) SpO2:  [93 %-98 %] 98 % (03/26 0957)  General - Well nourished, well developed, in no apparent distress.  Ophthalmologic - Fundi not visualized due to noncooperation.  Cardiovascular - Regular rate and rhythm.  Mental Status -  Level of arousal and orientation to time, place, and person were intact. Language including expression, naming, repetition, comprehension was assessed and found intact. But hypophonia.  Attention span and concentration were normal. However, mildly slow on reaction Fund of Knowledge was assessed and was intact.  Cranial Nerves II - XII - II - Visual field intact OU. III,  IV, VI - Extraocular movements intact. V - Facial sensation intact bilaterally. VII - Facial movement intact bilaterally. VIII - Hearing & vestibular intact bilaterally. X - Palate elevates symmetrically. XI - Chin turning & shoulder shrug intact bilaterally. XII - Tongue protrusion intact.  Motor Strength - The patient's strength was normal in all extremities except left LE knee extension 4/5 and pronator drift was absent.  Bulk was normal and fasciculations were absent.   Motor Tone - Muscle tone was assessed at the neck and appendages and was normal.  Reflexes - The patient's reflexes were 1+ in all extremities and she had no pathological reflexes.  Sensory - Light touch, temperature/pinprick were assessed and were symmetrical.    Coordination - The patient had normal movements in the hands with no ataxia or dysmetria but slow on motion.  Tremor was absent.  Gait and Station - deferred to PT   ASSESSMENT/PLAN Ms. NAYELI CALVERT is a 83 y.o. female with history of previous TIAs, hyperlipidemia, diabetes mellitus, breast cancer, melanoma left upper extremity, and chronic headaches presenting with left leg weakness. She did not receive IV t-PA due to late presentation.  Stroke: Right ACA territory infarction, concerning for embolic source, but pt does have large vessel athero including right cavernous ICA but not right ACA.  Resultant  Mild left LE weakness  MRI - Acute distal RIGHT ACA infarction  MRA - Neg for emergent LVO - diffuse athero including b/l cavernous ICA, b/l PCAs and new high-grade left ACA mid segment stenosis.  Carotid Doppler - unremarkable  2D Echo - unremarkable  30 day cardiac event monitoring in 03/2016 no afib. Family and pt refused loop recorder at this time.  LDL - 71  HgbA1c - 6.7  VTE prophylaxis - subcutaneous heparin Diet heart healthy/carb modified Room service appropriate? Yes; Fluid consistency: Thin  clopidogrel 75 mg daily prior to  admission, now on clopidogrel 75 mg daily.  Patient counseled to be compliant with her antithrombotic medications  Ongoing aggressive stroke risk factor management  Therapy recommendations: SNF  Disposition: Pending  Hx of TIA  03/02/16 - speech difficulty - MRI neg for stroke, EEG negative - MRA b/l cavernous ICA stenosis, b/l PCA stenosis - EF 65-70%, LDL 55, A1C 6.6 - discharged with plavix  Pt had similar episode in 2014  Hypertension  Stable  Permissive hypertension (OK if < 220/120) but gradually normalize in 5-7 days  Long-term BP goal normotensive  Hyperlipidemia  Home meds:  Zocor 20 mg daily resumed in hospital  LDL 71, goal < 70  Continue statin at discharge  Diabetes  HgbA1c 6.7, goal < 7.0  Controlled at home  CBG high at times this admission  SSI  Other Stroke Risk Factors  Advanced age  Family hx stroke (both parents, 2 brothers, and 3 sisters)  Other Active Problems  Mild leukocytosis - 11.5 - afebrile  Left breast cancer - cured  Melanoma of skin - cured  Follows with Dr. Rexene Alberts at [facility] - next appointment 10/12/16  Hospital day # 2  Neurology will sign off. Please call with questions. Pt will follow up with Dr. Rexene Alberts on 10/12/16. Thanks for the consult.   Rosalin Hawking, MD PhD Stroke Neurology 08/17/2016 3:02 PM    To contact Stroke Continuity provider, please refer to [email or web address] After hours, contact General Neurology

## 2016-08-17 NOTE — Clinical Social Work Note (Signed)
Received consult for SNF placement and assessment completed with patient and niece, [name] (full assessment to follow). Facility search initiated and CSW will f/u with patient and family on Tuesday with facility responses.  Annarae Macnair Givens, MSW, LCSW Licensed Clinical Social Worker [place] [phone]

## 2016-08-17 NOTE — NC FL2 (Signed)
[place] LEVEL OF CARE SCREENING TOOL     IDENTIFICATION  Patient Name: Lauren [name] Birthdate: 03/12/1928 Sex: female Admission Date (Current Location): 08/15/2016  [facility] and [place] Number:  Herbalist and Address:  [facility] [place], [place] [address] [place], [place] [address]      Provider Number: [id number]  Attending Physician Name and Address:  Mendel Corning, MD  Relative Name and Phone Number:  [name], niece; [phone] (mobile) and [phone] (home)    Current Level of Care: Hospital Recommended Level of Care: [place] Prior Approval Number:    Date Approved/Denied:   PASRR Number: [id number] (eff. 08/11/10)  Discharge Plan: SNF    Current Diagnoses: Patient Active Problem List   Diagnosis Date Noted  . Acute CVA (cerebrovascular accident) ([place]) 08/15/2016  . HTN (hypertension) 08/15/2016  . TIA (transient ischemic attack) 02/28/2016  . GERD (gastroesophageal reflux disease) 08/06/2015  . Hearing loss 08/06/2015  . Estrogen deficiency 05/01/2014  . Viral URI with cough 04/02/2014  . Left knee pain 09/22/2013  . Chest pain, musculoskeletal 08/05/2013  . Encounter for Medicare annual wellness exam 04/04/2013  . Colon cancer screening 04/04/2013  . History of melanoma excision 09/27/2012  . History of breast cancer, left 01/08/2011  . History of pelvic fracture 09/17/2010  . DEGENERATIVE JOINT DISEASE 02/13/2010  . Migraine variant 11/06/2009  . Osteopenia 10/18/2008  . Breast cancer, left breast ([place]) 09/10/2008  . Transient ischemic attack 09/10/2008  . Diabetes type 2, controlled ([place]) 10/06/2007  . VENOUS INSUFFICIENCY 10/06/2007  . Diverticulosis of colon 10/06/2007  . FIBROCYSTIC BREAST DISEASE 10/06/2007  . HYPERCHOLESTEROLEMIA, PURE 11/29/2006    Orientation RESPIRATION BLADDER Height & Weight     Self, Time, Situation, Place  Normal Continent Weight: 121 lb 6.4 oz  (55.1 kg) Height:  5\' 2"  (157.5 cm)  BEHAVIORAL SYMPTOMS/MOOD NEUROLOGICAL BOWEL NUTRITION STATUS      Continent Diet (Heart healthy, carb modfied)  AMBULATORY STATUS COMMUNICATION OF NEEDS Skin   Extensive Assist Verbally Normal                       Personal Care Assistance Level of Assistance  Bathing, Feeding, Dressing Bathing Assistance: Maximum assistance (Upper body mod assist and lower body max assist) Feeding assistance: Limited assistance (Supervision for safety) Dressing Assistance: Maximum assistance (Upper body min assist and lower body (help with taking socks off and on) total assist)     Functional Limitations Info  Sight, Hearing, Speech Sight Info: Impaired Hearing Info: Adequate Speech Info: Adequate    SPECIAL CARE FACTORS FREQUENCY  PT (By licensed PT), OT (By licensed OT), Speech therapy     PT Frequency: Evaluated 3/25 and a minimum of 4X per week therapy recommended OT Frequency: Evaluated 3/25 and a minimum of 2X per week therapy recommended     Speech Therapy Frequency: Evaluated 3/25      Contractures Contractures Info: Not present    Additional Factors Info  Code Status, Allergies Code Status Info: DNR Allergies Info: Sulfonamide Derivatives, Aspirin           Current Medications (08/17/2016):  This is the current hospital active medication list Current Facility-Administered Medications  Medication Dose Route Frequency Provider Last Rate Last Dose  . acetaminophen (TYLENOL) tablet 650 mg  650 mg Oral Q4H PRN Maren Reamer, MD       Or  . acetaminophen (TYLENOL) solution 650 mg  650  mg Per Tube Q4H PRN Maren Reamer, MD       Or  . acetaminophen (TYLENOL) suppository 650 mg  650 mg Rectal Q4H PRN Maren Reamer, MD      . acetaminophen (TYLENOL) tablet 650 mg  650 mg Oral Q6H PRN Maren Reamer, MD      . calcium carbonate (TUMS - dosed in mg elemental calcium) chewable tablet 200 mg of elemental calcium  1 tablet Oral PRN  Maren Reamer, MD      . citalopram (CELEXA) tablet 5 mg  5 mg Oral Daily Maren Reamer, MD   5 mg at 08/17/16 1039  . clopidogrel (PLAVIX) tablet 75 mg  75 mg Oral Daily Maren Reamer, MD   75 mg at 08/17/16 1039  . heparin injection 5,000 Units  5,000 Units Subcutaneous BID Maren Reamer, MD   5,000 Units at 08/17/16 1039  . hydroxypropyl methylcellulose / hypromellose (ISOPTO TEARS / GONIOVISC) 2.5 % ophthalmic solution 1 drop  1 drop Left Eye TID PRN Maren Reamer, MD   1 drop at 08/17/16 1039  . insulin aspart (novoLOG) injection 0-9 Units  0-9 Units Subcutaneous TID WC Maren Reamer, MD   1 Units at 08/17/16 0734  . omega-3 acid ethyl esters (LOVAZA) capsule 1 g  1 g Oral Daily Maren Reamer, MD   1 g at 08/17/16 1039  . pantoprazole (PROTONIX) EC tablet 40 mg  40 mg Oral Daily Maren Reamer, MD   40 mg at 08/17/16 1039  . senna-docusate (Senokot-S) tablet 1 tablet  1 tablet Oral QHS PRN Maren Reamer, MD   1 tablet at 08/16/16 2237  . simethicone (MYLICON) chewable tablet 80 mg  80 mg Oral Q6H PRN Maren Reamer, MD      . simvastatin (ZOCOR) tablet 20 mg  20 mg Oral QHS Maren Reamer, MD   20 mg at 08/16/16 2237     Discharge Medications: Please see discharge summary for a list of discharge medications.  Relevant Imaging Results:  Relevant Lab Results:   Additional Information [phone].  Sable Feil, LCSW

## 2016-08-17 NOTE — Progress Notes (Signed)
EP service called to evaluate for ILR 2/2 stroke, as I introduce myself though to the patient and niece at bedside, the niece reported that they had a family meeting with the patient included and have decided they do not want the implantable monitor either, and apologized that we didn't get the message before coming by.  I assured them that was OK, and we were available should they change their mind or want any further information.  EP remains available should they change their mind.  Thank you Tommye Standard, PA-C  EP Attending Agree with above. We are available as needed.   Mikle Bosworth.D.

## 2016-08-17 NOTE — Progress Notes (Signed)
qPhysical Therapy Treatment Patient Details Name: Lauren [name] MRN: [id number] DOB: 11-20-1927 Today's Date: 08/17/2016    History of Present Illness This is an 83 year old right-handed woman who presented to the ED complaining of some leg weakness and difficulty standing up today. MRI scan of the brain was performed in emergency department showed an acute right anterior cerebral artery infarction    PT Comments    Pt progressing towards physical therapy goals. Was able to perform transfers and ambulation with up to mod assist for balance support and safety. Decreased reliance on RW this session. Will continue to follow and progress as able per POC.    Follow Up Recommendations  SNF (Family prefers [facility])     Equipment Recommendations  Rolling walker with 5" wheels;3in1 (PT)    Recommendations for Other Services       Precautions / Restrictions Precautions Precautions: Fall Restrictions Weight Bearing Restrictions: No    Mobility  Bed Mobility Overal bed mobility: Needs Assistance Bed Mobility: Supine to Sit     Supine to sit: Mod assist     General bed mobility comments: Assist for LE movement towards EOB and trunk elevation to full sitting position. Pt was able to scoot out fully to EOB with increased time but without assist.   Transfers Overall transfer level: Needs assistance Equipment used: Rolling walker (2 wheeled) Transfers: Sit to/from Stand Sit to Stand: Mod assist         General transfer comment: Mod assist to power-up to full standing position from EOB as well as from recliner chair. Slight posterior lean noted but able to correct with min assist.   Ambulation/Gait Ambulation/Gait assistance: Min guard Ambulation Distance (Feet): 108 Feet Assistive device: Rolling walker (2 wheeled) Gait Pattern/deviations: Decreased stride length;Shuffle;Trunk flexed;Narrow base of support Gait velocity: decreased Gait velocity interpretation: Below normal  speed for age/gender General Gait Details: Slow and somewhat unsteady gait. VC's provided for closer walker proximity as well as general safety awareness.    Stairs            Wheelchair Mobility    Modified Rankin (Stroke Patients Only) Modified Rankin (Stroke Patients Only) Pre-Morbid Rankin Score: No symptoms Modified Rankin: Moderately severe disability     Balance Overall balance assessment: Needs assistance Sitting-balance support: Feet supported;No upper extremity supported Sitting balance-Leahy Scale: Fair   Postural control: Posterior lean Standing balance support: Bilateral upper extremity supported;During functional activity Standing balance-Leahy Scale: Poor                              Cognition Arousal/Alertness: Awake/alert Behavior During Therapy: WFL for tasks assessed/performed Overall Cognitive Status: Within Functional Limits for tasks assessed Area of Impairment: Attention;Following commands;Safety/judgement;Problem solving                   Current Attention Level: Sustained   Following Commands: Follows one step commands with increased time Safety/Judgement: Decreased awareness of safety;Decreased awareness of deficits Awareness: Emergent Problem Solving: Slow processing;Decreased initiation;Requires verbal cues        Exercises      General Comments        Pertinent Vitals/Pain Pain Assessment: No/denies pain    Home Living                      Prior Function            PT Goals (current goals can now be found in the  care plan section) Acute Rehab PT Goals Patient Stated Goal: to get home to her sister PT Goal Formulation: With patient Time For Goal Achievement: 08/30/16 Potential to Achieve Goals: Good Progress towards PT goals: Progressing toward goals    Frequency    Min 4X/week      PT Plan Current plan remains appropriate    Co-evaluation             End of Session  Equipment Utilized During Treatment: Gait belt Activity Tolerance: Patient tolerated treatment well Patient left: in chair;with call bell/phone within reach;with chair alarm set;with family/visitor present Nurse Communication: Mobility status PT Visit Diagnosis: Unsteadiness on feet (R26.81)     Time: 6886-4847 PT Time Calculation (min) (ACUTE ONLY): 27 min  Charges:  $Gait Training: 23-37 mins                    G Codes:        Thelma Comp 09-13-16, 2:23 PM   Rolinda Roan, PT, DPT Acute Rehabilitation Services Pager: [phone]

## 2016-08-18 DIAGNOSIS — I63521 Cerebral infarction due to unspecified occlusion or stenosis of right anterior cerebral artery: Secondary | ICD-10-CM | POA: Diagnosis not present

## 2016-08-18 DIAGNOSIS — K219 Gastro-esophageal reflux disease without esophagitis: Secondary | ICD-10-CM | POA: Diagnosis not present

## 2016-08-18 DIAGNOSIS — R41841 Cognitive communication deficit: Secondary | ICD-10-CM | POA: Diagnosis not present

## 2016-08-18 DIAGNOSIS — G464 Cerebellar stroke syndrome: Secondary | ICD-10-CM | POA: Diagnosis not present

## 2016-08-18 DIAGNOSIS — G451 Carotid artery syndrome (hemispheric): Secondary | ICD-10-CM | POA: Diagnosis not present

## 2016-08-18 DIAGNOSIS — E119 Type 2 diabetes mellitus without complications: Secondary | ICD-10-CM | POA: Diagnosis not present

## 2016-08-18 DIAGNOSIS — E7800 Pure hypercholesterolemia, unspecified: Secondary | ICD-10-CM | POA: Diagnosis not present

## 2016-08-18 DIAGNOSIS — I639 Cerebral infarction, unspecified: Secondary | ICD-10-CM | POA: Diagnosis not present

## 2016-08-18 DIAGNOSIS — I10 Essential (primary) hypertension: Secondary | ICD-10-CM | POA: Diagnosis not present

## 2016-08-18 DIAGNOSIS — R2681 Unsteadiness on feet: Secondary | ICD-10-CM | POA: Diagnosis not present

## 2016-08-18 DIAGNOSIS — F411 Generalized anxiety disorder: Secondary | ICD-10-CM | POA: Diagnosis not present

## 2016-08-18 DIAGNOSIS — M6281 Muscle weakness (generalized): Secondary | ICD-10-CM | POA: Diagnosis not present

## 2016-08-18 DIAGNOSIS — R5383 Other fatigue: Secondary | ICD-10-CM | POA: Diagnosis not present

## 2016-08-18 DIAGNOSIS — I6350 Cerebral infarction due to unspecified occlusion or stenosis of unspecified cerebral artery: Secondary | ICD-10-CM | POA: Diagnosis not present

## 2016-08-18 DIAGNOSIS — Z79899 Other long term (current) drug therapy: Secondary | ICD-10-CM | POA: Diagnosis not present

## 2016-08-18 DIAGNOSIS — R1311 Dysphagia, oral phase: Secondary | ICD-10-CM | POA: Diagnosis not present

## 2016-08-18 DIAGNOSIS — R278 Other lack of coordination: Secondary | ICD-10-CM | POA: Diagnosis not present

## 2016-08-18 LAB — GLUCOSE, CAPILLARY
Glucose-Capillary: 129 mg/dL — ABNORMAL HIGH (ref 65–99)
Glucose-Capillary: 196 mg/dL — ABNORMAL HIGH (ref 65–99)

## 2016-08-18 MED ORDER — LORAZEPAM 0.5 MG PO TABS: 0.5000 mg | ORAL_TABLET | Freq: Every day | ORAL | Status: DC | PRN

## 2016-08-18 NOTE — Care Management Note (Signed)
Case Management Note  Patient Details  Name: Lauren [name] MRN: [id number] Date of Birth: 03/24/1928  Subjective/Objective:                    Action/Plan: Pt discharging to [facility] today. No further needs per CM.  Expected Discharge Date:  08/18/16               Expected Discharge Plan:  [place]  In-House Referral:     Discharge planning Services     Post Acute Care Choice:    Choice offered to:     DME Arranged:    DME Agency:     HH Arranged:    [place] Agency:     Status of Service:  Completed, signed off  If discussed at [employer], dates discussed:    Additional Comments:  Pollie Friar, RN 08/18/2016, 2:22 PM

## 2016-08-18 NOTE — Progress Notes (Signed)
PT Cancellation Note  Patient Details Name: Lauren [name] MRN: [id number] DOB: 01-03-1928   Cancelled Treatment:    Reason Eval/Treat Not Completed: Pt case discussed with RN who states patient about to be discharged.  If PT is needed prior to D/C, please contact [phone].  Gaetano Net SPT 08/18/2016, 1:13 PM

## 2016-08-18 NOTE — Progress Notes (Signed)
Responded to page from nurse that pt's niece, pastor and friend were with her, and had broken news of her sister's death. They were all glad I also came to provide spiritual/emotional support, as I expressed condolences to pt. Doristine Bosworth was empathetically sitting at pt's feet as pt sat in chair, and all 3 women were attending to her in her grief. She was weeping, but it was also at the point I could share a bit of laughter w/ them at their family stories of her sister. Told pastor I'd defer to her for moment, and pastor said she'd let nurse know when she left. Chaplain available for f/u.   08/18/16 1300  Clinical Encounter Type  Visited With Patient and family together  Visit Type Follow-up  Referral From Nurse  Spiritual Encounters  Spiritual Needs Emotional;Grief support  Stress Factors  Patient Stress Factors Loss   Gerrit Heck, Chaplain

## 2016-08-18 NOTE — Progress Notes (Signed)
Responded to call from nurse for support for older pt about to be told her elder sister has died on hospice at home while pt is now in hospital due to be discharged and go to a SNF today. Pt's niece is coming w/ their pastor to break this news to pt -- whom niece told nurse would not take it well. Nurse and/or niece believed pt  could also use support from chaplain, and nurse and I agreed the best time for that would be after pt had heard this news from those coming to share it with her.   Nurse suggested to niece that it would be good for pt to be able to see sister for closure -- possibly on way to SNF post-discharge? -- and will pursue that with niece pre-discharge once pt knows of her sister's death. Chaplain available for f/u.   08/18/16 1200  Clinical Encounter Type  Visited With Health care provider  Visit Type Initial;Psychological support;Spiritual support;Social support  Referral From Nurse  Spiritual Encounters  Spiritual Needs Emotional;Grief support  Stress Factors  Patient Stress Factors Loss   Gerrit Heck, Chaplain

## 2016-08-18 NOTE — Progress Notes (Signed)
Patient discharging to [facility] SNF. Transportation has been called.  Patient's sister, with whom she lived prior to admission passed away today and patient is anxious to get to [facility] where there is some number of family residing.  IV removed and telemetry discontinued. Discharge instructions reviewed with patient and SNF staff. Patient questions asked and answered. Discharge information packet to be sent to SNF with patient via [facility]. Wendee Copp

## 2016-08-18 NOTE — Discharge Summary (Signed)
Physician Discharge Summary   Patient ID: Lauren [name] MRN: [id number] DOB/AGE: 10-06-1927 83 y.o.  Admit date: 08/15/2016 Discharge date: 08/18/2016  Primary Care Physician:  [name], MD  Discharge Diagnoses:    . Acute CVA (cerebrovascular accident) ([place]) . HYPERCHOLESTEROLEMIA, PURE . Hypertension   Diabetes mellitus   Anxiety   GERD   Consults: Neurology EP cardiology  Recommendations for Outpatient Follow-up:  1. Please repeat CBC/BMET at next visit   DIET: Carb modified diet    Allergies:   Allergies  Allergen Reactions  . Sulfonamide Derivatives Itching  . Aspirin Other (See Comments)    Burning and pain in stomach     DISCHARGE MEDICATIONS: Current Discharge Medication List    START taking these medications   Details  pantoprazole (PROTONIX) 40 MG tablet Take 1 tablet (40 mg total) by mouth daily. Qty: 30 tablet, Refills: 3      CONTINUE these medications which have NOT CHANGED   Details  acetaminophen (TYLENOL) 325 MG tablet Take 2 tablets (650 mg total) by mouth every 6 (six) hours as needed for mild pain (or Fever >/= 101).    calcium carbonate (TUMS - DOSED IN MG ELEMENTAL CALCIUM) 500 MG chewable tablet Chew 1 tablet by mouth as needed for heartburn.     citalopram (CELEXA) 10 MG tablet Take 0.5 tablets (5 mg total) by mouth daily. Qty: 15 tablet, Refills: 11    clopidogrel (PLAVIX) 75 MG tablet TAKE ONE TABLET BY MOUTH EACH MORNING WITH BREAKFAST Qty: 30 tablet, Refills: 11    fish oil-omega-3 fatty acids 1000 MG capsule Take 1 g by mouth daily.      hydroxypropyl methylcellulose / hypromellose (ISOPTO TEARS / GONIOVISC) 2.5 % ophthalmic solution Place 1 drop into the left eye 3 (three) times daily. Qty: 15 mL, Refills: 12    metFORMIN (GLUCOPHAGE) 500 MG tablet Take 0.5 tablets (250 mg total) by mouth 2 (two) times daily with a meal. Qty: 90 tablet, Refills: 3    Multiple Vitamins-Minerals (PRESERVISION AREDS 2 PO) Take 1  tablet by mouth 2 (two) times daily.    polyethylene glycol powder (GLYCOLAX/MIRALAX) powder DISSOLVE ONE (1) CAPFUL (17GM) INTO 4 TO8 OUNCES OF FLUID AND TAKE BYMOUTH ONCE DAILY AS DIRECTED Qty: 527 g, Refills: 11    ranitidine (ZANTAC) 300 MG tablet Take 1 tablet (300 mg total) by mouth daily as needed for heartburn. Qty: 30 tablet, Refills: 11    simethicone (MYLICON) 80 MG chewable tablet Chew 80 mg by mouth every 6 (six) hours as needed for flatulence.    simvastatin (ZOCOR) 20 MG tablet TAKE ONE TABLET BY MOUTH EVERY NIGHT AT BEDTIME Qty: 30 tablet, Refills: 5    glucose blood (ONE TOUCH ULTRA TEST) test strip USE AS DIRECTED TO CHECK BLOOD SUGAR ONCE A DAY AND AS NEEDED (DX. E11.9) Qty: 100 each, Refills: 1    hydrOXYzine (ATARAX/VISTARIL) 10 MG tablet Take 1 tablet (10 mg total) by mouth at bedtime as needed for itching. Qty: 30 tablet, Refills: 1      STOP taking these medications     Calcium-Vitamin D 600-200 MG-UNIT per tablet      NEXIUM 40 MG capsule          Brief H and P: For complete details please refer to admission H and P, but in briefJeanne G Ingoldis a 83 y.o.femalewith significant history of a prior TIA on Plavix, diabetes, breast cancer, presented with worsening left-sided weakness that started the day before the  admission. She had notedprogressive left-sided weakness, trouble picking up her left leg and getting as the car and difficulty walking while grocery shopping. Family members stated they watchedher overnight to see if she would get better this morning. They took her to ER b/c she was not getting better, and they noted a left sided facial droop today as well.  Hospital Course:  Acute CVA  - Presenting with left-sided weakness, left facial drooping - MRI of the brain showed acute distal right anterior cerebral artery territory infarction primarily involving the corpus callosum - MRA negative for emergent large vessel occlusion - 2-D echo  showed EF of 37-10%, grade 1 diastolic dysfunction - Carotid Dopplers showed 1-39% ICA plaquing - PT OT eval recommended skilled nursing facility - Neurology following recommended to continue Plavix at this time - LDL 71, continue statin -  Hemoglobin A1c 6.7  - Neurology recommended loop recorder , EP cardiology was consulted however patient and family declined to have a loop recorder placed.  Hypertension - was diet controlled, not on antihypertensives on home meds  Diabetes mellitus -Resume outpatient regimen upon discharge - Hemoglobin A1c 6.7   patient was placed on sliding scale insulin while inpatient  Hyperlipidemia - LDL 71 - continue simvastatin 20 qhs   Anxiety/depression - continue celexa 5mg  qd   gerd - continue ppi 40 qd and pepcid (for zantac)  Day of Discharge BP 109/62 (BP Location: Right Arm)   Pulse 77   Temp 97.5 F (36.4 C) (Oral)   Resp 18   Ht 5\' 2"  (1.575 m)   Wt 55.1 kg (121 lb 6.4 oz)   SpO2 100%   BMI 22.20 kg/m   Physical Exam: General: Alert and awake oriented x3 not in any acute distress. HEENT: anicteric sclera, pupils reactive to light and accommodation CVS: S1-S2 clear no murmur rubs or gallops Chest: clear to auscultation bilaterally, no wheezing rales or rhonchi Abdomen: soft nontender, nondistended, normal bowel sounds Extremities: no cyanosis, clubbing or edema noted bilaterally    The results of significant diagnostics from this hospitalization (including imaging, microbiology, ancillary and laboratory) are listed below for reference.    LAB RESULTS: Basic Metabolic Panel:  Recent Labs Lab 08/15/16 1105 08/15/16 1649  NA 139  --   K 4.3  --   CL 106  --   CO2 24  --   GLUCOSE 182*  --   BUN 14  --   CREATININE 0.76 0.73  CALCIUM 9.8  --    Liver Function Tests:  Recent Labs Lab 08/15/16 1105  AST 22  ALT 13*  ALKPHOS 60  BILITOT 0.5  PROT 6.6  ALBUMIN 3.5   No results for input(s): LIPASE, AMYLASE  in the last 168 hours. No results for input(s): AMMONIA in the last 168 hours. CBC:  Recent Labs Lab 08/15/16 1105 08/15/16 1649  WBC 9.8 11.5*  NEUTROABS 7.0  --   HGB 13.9 13.3  HCT 42.4 40.4  MCV 95.9 96.0  PLT 188 219   Cardiac Enzymes: No results for input(s): CKTOTAL, CKMB, CKMBINDEX, TROPONINI in the last 168 hours. BNP: Invalid input(s): POCBNP CBG:  Recent Labs Lab 08/17/16 2145 08/18/16 0649  GLUCAP 123* 129*    Significant Diagnostic Studies:  Dg Chest 2 View  Result Date: 08/15/2016 CLINICAL DATA:  Left facial droop, left-sided weakness. EXAM: CHEST  2 VIEW COMPARISON:  Radiographs of May 01, 2014. FINDINGS: The heart size and mediastinal contours are within normal limits. Both lungs are clear.  Stable elevated left hemidiaphragm is noted. No pneumothorax or pleural effusion is noted. The visualized skeletal structures are unremarkable. IMPRESSION: No active cardiopulmonary disease. Electronically Signed   By: Marijo Conception, M.D.   On: 08/15/2016 15:36   Mr Brain Wo Contrast  Result Date: 08/15/2016 CLINICAL DATA:  Patient states RIGHT leg pain. Difficulty getting up. EXAM: MRI HEAD WITHOUT CONTRAST TECHNIQUE: Multiplanar, multiecho pulse sequences of the brain and surrounding structures were obtained without intravenous contrast. COMPARISON:  CT head 06/13/2016. MR head 02/29/2016. MRA head 02/29/2016. FINDINGS: Brain: Acute infarction, RIGHT ACA territory, primarily involving the RIGHT-sided genu and body of corpus callosum. Medial RIGHT frontal cortex and cingulate gyrus not significantly affected. No hemorrhage, mass lesion, hydrocephalus, or extra-axial fluid. Global atrophy. Moderately advanced chronic microvascular ischemic change. Vascular: Normal flow voids. Proximally, the RIGHT ACA is patent. Tiny focus chronic hemorrhage, LEFT centrum semiovale. Skull and upper cervical spine: Unremarkable. Sinuses/Orbits: Negative.  BILATERAL cataract extraction Other:  None. IMPRESSION: Acute distal RIGHT anterior cerebral artery territory infarction, primarily involving the corpus callosum. Atrophy and small vessel disease, similar to priors. Electronically Signed   By: Staci Righter M.D.   On: 08/15/2016 13:29   Mr Jodene Nam Head/brain LD Cm  Result Date: 08/15/2016 CLINICAL DATA:  83 year old female found have a distal right ACA territory acute infarct on MRI today performed for lower extremity pain and weakness. EXAM: MRA HEAD WITHOUT CONTRAST TECHNIQUE: Angiographic images of the Circle of Willis were obtained using MRA technique without intravenous contrast. COMPARISON:  Brain MRI 1232 hours today. Previous brain MRI and intracranial MRA 02/29/2016 FINDINGS: Stable antegrade flow in the posterior circulation with no distal vertebral artery stenosis. Normal PICA origins, the left is early. Normal vertebrobasilar junction. No basilar stenosis. Normal SCA and PCA origins. Bilateral PCA branches are stable with only mild right PCA P2 segment stenosis. Stable antegrade flow in both ICA siphons. Chronic tortuosity of the distal cervical right ICA again noted. Bilateral distal cavernous and supraclinoid ICA irregularity with mild left ICA siphon stenosis is stable. Carotid termini remain patent. MCA and ACA origins remain patent. Dominant right A1 segment. Anterior communicating artery remains patent. There is a new high-grade stenosis in the left ACA mid A2 segment, but there is no right ACA stenosis or occlusion identified. See series 403, image 7. There is preserved distal left ACA flow. Bilateral MCA M1 segments, MCA bifurcations, and visible bilateral MCA branches are stable. IMPRESSION: 1. Negative for emergent large vessel occlusion. There is no visible right ACA stenosis or occlusion, although there is a new high-grade left ACA 8 mid a 2 segment stenosis since October 2017. 2. Otherwise stable intracranial MRA since 2017, including mild left ICA siphon and right PCA stenosis.  Electronically Signed   By: Genevie Ann M.D.   On: 08/15/2016 16:16    2D ECHO: Study Conclusions  - Left ventricle: The cavity size was normal. Wall thickness was   normal. Systolic function was vigorous. The estimated ejection   fraction was in the range of 65% to 70%. Wall motion was normal;   there were no regional wall motion abnormalities. Doppler   parameters are consistent with abnormal left ventricular   relaxation (grade 1 diastolic dysfunction). - Aortic valve: Mildly calcified annulus. Trileaflet; mildly   thickened leaflets. There was mild regurgitation. Valve area   (VTI): 1.75 cm^2. Valve area (Vmax): 1.43 cm^2. Valve area   (Vmean): 1.39 cm^2. - Mitral valve: Mildly calcified annulus. Mildly thickened leaflets   . -  Left atrium: The atrium was mildly to moderately dilated.   Disposition and Follow-up:    DISPOSITION: SNF    DISCHARGE FOLLOW-UP Follow-up Information    Star Age, MD. Go on 10/12/2016.   Specialties:  Neurology, Radiology Contact information: [address] [place] [address] [place], MD. Schedule an appointment as soon as possible for a visit in 2 week(s).   Specialties:  Family Medicine, Radiology Contact information: [place] [address] [phone]            Time spent on Discharge: 84mins   Signed:   Irene Mitcham M.D. [facility] 08/18/2016, 11:10 AM Pager: [phone]

## 2016-08-18 NOTE — Progress Notes (Signed)
OT Cancellation Note  Patient Details Name: Lauren [name] MRN: [id number] DOB: 30-Sep-1927   Cancelled Treatment:    Reason Eval/Treat Not Completed: Other (comment): Discussed case with PT who reports pt preparing for D/C to SNF. Please page number below if needs change or pt requires OT prior to D/C.  Norman Herrlich, MS OTR/L  Pager: [place] A [name] 08/18/2016, 1:28 PM

## 2016-08-18 NOTE — Clinical Social Work Note (Signed)
Clinical Social Work Assessment  Patient Details  Name: Lauren [name] MRN: [id number] Date of Birth: [birth year]  Date of referral:  08/17/16               Reason for consult:  Facility Placement                Permission sought to share information with:  Family Supports Permission granted to share information::  Yes, Verbal Permission Granted  Name::     [name]  Agency::     Relationship::  Niece  Sport and exercise psychologist Information:  [phone] (mobile) and [phone] (home)  Housing/Transportation Living arrangements for the past 2 months:  [place] (Patient and her sister live together) Source of Information:  Patient, Other (Comment Required) (Niece [name]) Patient Interpreter Needed:  None Criminal Activity/Legal Involvement Pertinent to Current Situation/Hospitalization:  No - Comment as needed Significant Relationships:  Other Family Members Lives with:  Siblings (Patient and her sister live together. Her sister has advanced dementia) Do you feel safe going back to the place where you live?  No (Patient in agreement with going to rehab before returning home) Need for family participation in patient care:  Yes (Comment)  Care giving concerns:  Patient and niece [name]) in agreement at [facility] rehab necessary as patient does not have access to 24/7 care at home and her sister has advanced dementia.  Social Worker assessment / plan:  CSW talked with patient and niece on 3/26  at the bedside regarding discharge disposition. Patient was in bed and was alert, oriented and participated in discharge discussion. Patient informed CSW of all of the relatives they have at [place] and Ms. Rich Reining informed CSW that she had already spoken with admissions director regarding patient's need for rehab. Niece was confident that patient would be accepted at [facility].  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  Patient/Family's Understanding of and Emotional Response to Diagnosis, Current Treatment, and Prognosis:  Not discussed.  Emotional Assessment Appearance:  Appears stated age Attitude/Demeanor/Rapport:  Other (Appropriate) Affect (typically observed):  Appropriate, Pleasant Orientation:  Oriented to Self, Oriented to Place, Oriented to  Time, Oriented to Situation Alcohol / Substance use:  Never Used Psych involvement (Current and /or in the community):  No (Comment)  Discharge Needs  Concerns to be addressed:  Discharge Planning Concerns Readmission within the last 30 days:  No Current discharge risk:  None Barriers to Discharge:  No Barriers Identified   Sable Feil, LCSW 08/18/2016, 1:13 PM

## 2016-08-18 NOTE — Clinical Social Work Placement (Signed)
   CLINICAL SOCIAL WORK PLACEMENT  NOTE 08/18/16 - DISCHARGED TO [facility]IA [facility]BULANCE  Date:  08/18/2016  Patient Details  Name: Lauren [name] MRN: [id number] Date of Birth: 21-Jul-1927  Clinical Social Work is seeking post-discharge placement for this patient at the [place] level of care (*CSW will initial, date and re-position this form in  chart as items are completed):  No (CSW provided with facility preference)   Patient/family provided with [place] Work Department's list of facilities offering this level of care within the geographic area requested by the patient (or if unable, by the patient's family).  Yes   Patient/family informed of their freedom to choose among providers that offer the needed level of care, that participate in Medicare, Medicaid or managed care program needed by the patient, have an available bed and are willing to accept the patient.  No   Patient/family informed of [facility]'s ownership interest in [facility] and [facility], as well as of the fact that they are under no obligation to receive care at these facilities.  PASRR submitted to EDS on       PASRR number received on       Existing PASRR number confirmed on 08/17/16     FL2 transmitted to all facilities in geographic area requested by pt/family on 08/17/16     FL2 transmitted to all facilities within larger geographic area on       Patient informed that his/her managed care company has contracts with or will negotiate with certain facilities, including the following:        Yes   Patient/family informed of bed offers received.  Patient chooses bed at [place] [place]     Physician recommends and patient chooses bed at      Patient to be transferred to [place], [place] on 08/18/16.  Patient to be transferred to facility by Ambulance     Patient family notified on 08/18/16 of transfer.  Name of family member  notified:  [name], niece - [phone] (mobile)     PHYSICIAN       Additional Comment:    _______________________________________________ Sable Feil, LCSW 08/18/2016, 1:30 PM

## 2016-09-11 ENCOUNTER — Other Ambulatory Visit: Payer: Self-pay | Admitting: Family Medicine

## 2016-09-11 DIAGNOSIS — R1311 Dysphagia, oral phase: Secondary | ICD-10-CM | POA: Diagnosis not present

## 2016-09-11 DIAGNOSIS — F329 Major depressive disorder, single episode, unspecified: Secondary | ICD-10-CM | POA: Diagnosis not present

## 2016-09-11 DIAGNOSIS — Z7902 Long term (current) use of antithrombotics/antiplatelets: Secondary | ICD-10-CM | POA: Diagnosis not present

## 2016-09-11 DIAGNOSIS — Z7984 Long term (current) use of oral hypoglycemic drugs: Secondary | ICD-10-CM | POA: Diagnosis not present

## 2016-09-11 DIAGNOSIS — I10 Essential (primary) hypertension: Secondary | ICD-10-CM | POA: Diagnosis not present

## 2016-09-11 DIAGNOSIS — E119 Type 2 diabetes mellitus without complications: Secondary | ICD-10-CM | POA: Diagnosis not present

## 2016-09-11 DIAGNOSIS — M109 Gout, unspecified: Secondary | ICD-10-CM | POA: Diagnosis not present

## 2016-09-11 DIAGNOSIS — R41841 Cognitive communication deficit: Secondary | ICD-10-CM | POA: Diagnosis not present

## 2016-09-11 DIAGNOSIS — I69354 Hemiplegia and hemiparesis following cerebral infarction affecting left non-dominant side: Secondary | ICD-10-CM | POA: Diagnosis not present

## 2016-09-11 DIAGNOSIS — E785 Hyperlipidemia, unspecified: Secondary | ICD-10-CM | POA: Diagnosis not present

## 2016-09-11 DIAGNOSIS — F419 Anxiety disorder, unspecified: Secondary | ICD-10-CM | POA: Diagnosis not present

## 2016-09-14 ENCOUNTER — Telehealth: Payer: Self-pay

## 2016-09-14 DIAGNOSIS — I10 Essential (primary) hypertension: Secondary | ICD-10-CM | POA: Diagnosis not present

## 2016-09-14 DIAGNOSIS — R1311 Dysphagia, oral phase: Secondary | ICD-10-CM | POA: Diagnosis not present

## 2016-09-14 DIAGNOSIS — R41841 Cognitive communication deficit: Secondary | ICD-10-CM | POA: Diagnosis not present

## 2016-09-14 DIAGNOSIS — I69354 Hemiplegia and hemiparesis following cerebral infarction affecting left non-dominant side: Secondary | ICD-10-CM | POA: Diagnosis not present

## 2016-09-14 DIAGNOSIS — M109 Gout, unspecified: Secondary | ICD-10-CM | POA: Diagnosis not present

## 2016-09-14 DIAGNOSIS — E119 Type 2 diabetes mellitus without complications: Secondary | ICD-10-CM | POA: Diagnosis not present

## 2016-09-14 NOTE — Telephone Encounter (Signed)
Stacy PT with [facility] left v/m requesting verbal orders for [facility] speech therapy eval and assessment for swallowing and cognition and memory. Having some word finding issues. On 09/13/16 suddenly pt could not talk for short period of time.pt has appt for f/u on 09/22/16 and Stacy did not know if Dr Glori Bickers would want to see pt prior to 09/22/16. Pt home from stroke rehab. Stacy request cb.

## 2016-09-14 NOTE — Telephone Encounter (Signed)
Please ok the [place] orders Thanks for letting me know about the speech problem on 4/22 Please continue her plavix  If she has another episode please get her to ED I will cc this to her neurologist Dr Rexene Alberts who she has f/u with in May to see if they have any further advice or want to see her earlier   Please send this back to me as well  Thanks

## 2016-09-14 NOTE — Telephone Encounter (Signed)
Stacy, PT with [facility] advised.  Note sent back to Dr. Glori Bickers as advised.

## 2016-09-15 DIAGNOSIS — I69354 Hemiplegia and hemiparesis following cerebral infarction affecting left non-dominant side: Secondary | ICD-10-CM | POA: Diagnosis not present

## 2016-09-15 DIAGNOSIS — R1311 Dysphagia, oral phase: Secondary | ICD-10-CM | POA: Diagnosis not present

## 2016-09-15 DIAGNOSIS — E119 Type 2 diabetes mellitus without complications: Secondary | ICD-10-CM | POA: Diagnosis not present

## 2016-09-15 DIAGNOSIS — M109 Gout, unspecified: Secondary | ICD-10-CM | POA: Diagnosis not present

## 2016-09-15 DIAGNOSIS — I10 Essential (primary) hypertension: Secondary | ICD-10-CM | POA: Diagnosis not present

## 2016-09-15 DIAGNOSIS — R41841 Cognitive communication deficit: Secondary | ICD-10-CM | POA: Diagnosis not present

## 2016-09-15 NOTE — Telephone Encounter (Signed)
Please let them know I was able to contact her neurologist- who is aware of the situation  No changes in management at this time- continue current medicines  Keep appt with me and neuro as they are and keep [place] posted

## 2016-09-15 NOTE — Telephone Encounter (Signed)
Left voicemail on Lauren [name]'s phone letting her know Dr. Marliss Coots comments. Also called niece [name] and advise her of Dr. Marliss Coots comments and she verbalized understanding

## 2016-09-16 DIAGNOSIS — I10 Essential (primary) hypertension: Secondary | ICD-10-CM | POA: Diagnosis not present

## 2016-09-16 DIAGNOSIS — M109 Gout, unspecified: Secondary | ICD-10-CM | POA: Diagnosis not present

## 2016-09-16 DIAGNOSIS — R41841 Cognitive communication deficit: Secondary | ICD-10-CM | POA: Diagnosis not present

## 2016-09-16 DIAGNOSIS — R1311 Dysphagia, oral phase: Secondary | ICD-10-CM | POA: Diagnosis not present

## 2016-09-16 DIAGNOSIS — E119 Type 2 diabetes mellitus without complications: Secondary | ICD-10-CM | POA: Diagnosis not present

## 2016-09-16 DIAGNOSIS — I69354 Hemiplegia and hemiparesis following cerebral infarction affecting left non-dominant side: Secondary | ICD-10-CM | POA: Diagnosis not present

## 2016-09-17 ENCOUNTER — Encounter (INDEPENDENT_AMBULATORY_CARE_PROVIDER_SITE_OTHER): Payer: Self-pay

## 2016-09-17 ENCOUNTER — Ambulatory Visit (INDEPENDENT_AMBULATORY_CARE_PROVIDER_SITE_OTHER): Payer: Medicare Other | Admitting: Family Medicine

## 2016-09-17 ENCOUNTER — Encounter: Payer: Self-pay | Admitting: Family Medicine

## 2016-09-17 VITALS — BP 126/62 | HR 90 | Temp 99.0°F | Wt 125.5 lb

## 2016-09-17 DIAGNOSIS — J209 Acute bronchitis, unspecified: Secondary | ICD-10-CM | POA: Diagnosis not present

## 2016-09-17 DIAGNOSIS — I639 Cerebral infarction, unspecified: Secondary | ICD-10-CM | POA: Diagnosis not present

## 2016-09-17 DIAGNOSIS — R350 Frequency of micturition: Secondary | ICD-10-CM | POA: Insufficient documentation

## 2016-09-17 LAB — POC URINALSYSI DIPSTICK (AUTOMATED)
BILIRUBIN UA: NEGATIVE
GLUCOSE UA: NEGATIVE
KETONES UA: NEGATIVE
Nitrite, UA: NEGATIVE
PH UA: 6 (ref 5.0–8.0)
PROTEIN UA: NEGATIVE
Urobilinogen, UA: 0.2 E.U./dL

## 2016-09-17 NOTE — Addendum Note (Signed)
Addended by: Ria Bush on: 09/17/2016 07:33 PM   Modules accepted: Orders

## 2016-09-17 NOTE — Progress Notes (Addendum)
BP 126/62   Pulse 90   Temp 99 F (37.2 C) (Oral)   Wt 125 lb 8 oz (56.9 kg)   SpO2 97%   BMI 22.95 kg/m    CC: 1 wk h/o cough Subjective:    Patient ID: Lauren [name], female    DOB: [birth year], 83 y.o.   MRN: [id number]  HPI: DAVID TOWSON is a 83 y.o. female presenting on 09/17/2016 for Cough (for at least 1 week; just released from rehab center and hospital; had TIA at the beginning of the week; has TCM visit with Dr. Glori Bickers next week)   Here with nephew Waynard Edwards.   Recent hospitalization for TIA s/p rehab stay, just came home 09/10/2016. Overall doing well after stroke - still feels weak and is working on balance. Using walker now. Has f/u TCM next week with PCP.   Patient presents with 1+ wk h/o cough productive of white sputum in the morning.   Denies fevers/chills, ear or tooth pain, ST or PNDrainage, body aches, headaches, dyspnea or wheezing. No congestion or rhinorrhea. No trouble sleeping from cough but does wake up coughing.  No sick contacts at home. No smokers at home.  No h/o asthma.  So far has tried delsym at night with some benefit.   Noticing increased frequency, urgency since stroke. Denies dysuria, hematuria, abd or flank pain, no fevers/chills, nausea.   Relevant past medical, surgical, family and social history reviewed and updated as indicated. Interim medical history since our last visit reviewed. Allergies and medications reviewed and updated. Outpatient Medications Prior to Visit  Medication Sig Dispense Refill  . acetaminophen (TYLENOL) 325 MG tablet Take 2 tablets (650 mg total) by mouth every 6 (six) hours as needed for mild pain (or Fever >/= 101).    . calcium carbonate (TUMS - DOSED IN MG ELEMENTAL CALCIUM) 500 MG chewable tablet Chew 1 tablet by mouth as needed for heartburn.     . citalopram (CELEXA) 10 MG tablet Take 0.5 tablets (5 mg total) by mouth daily. 15 tablet 11  . clopidogrel (PLAVIX) 75 MG tablet TAKE ONE TABLET BY MOUTH EVERY  MORNING WITH BREAKFAST. 30 tablet 1  . fish oil-omega-3 fatty acids 1000 MG capsule Take 1 g by mouth daily.      Marland Kitchen glucose blood (ONE TOUCH ULTRA TEST) test strip USE AS DIRECTED TO CHECK BLOOD SUGAR ONCE A DAY AND AS NEEDED (DX. E11.9) 100 each 1  . hydrOXYzine (ATARAX/VISTARIL) 10 MG tablet Take 1 tablet (10 mg total) by mouth at bedtime as needed for itching. 30 tablet 1  . metFORMIN (GLUCOPHAGE) 500 MG tablet Take 0.5 tablets (250 mg total) by mouth 2 (two) times daily with a meal. 90 tablet 3  . Multiple Vitamins-Minerals (PRESERVISION AREDS 2 PO) Take 1 tablet by mouth 2 (two) times daily.    . pantoprazole (PROTONIX) 40 MG tablet Take 1 tablet (40 mg total) by mouth daily. 30 tablet 3  . polyethylene glycol powder (GLYCOLAX/MIRALAX) powder DISSOLVE ONE (1) CAPFUL (17GM) INTO 4 TO8 OUNCES OF FLUID AND TAKE BYMOUTH ONCE DAILY AS DIRECTED 527 g 0  . ranitidine (ZANTAC) 300 MG tablet Take 1 tablet (300 mg total) by mouth daily as needed for heartburn. 30 tablet 11  . simethicone (MYLICON) 80 MG chewable tablet Chew 80 mg by mouth every 6 (six) hours as needed for flatulence.    . simvastatin (ZOCOR) 20 MG tablet TAKE ONE TABLET BY MOUTH EVERY NIGHT AT BEDTIME 30 tablet  5  . hydroxypropyl methylcellulose / hypromellose (ISOPTO TEARS / GONIOVISC) 2.5 % ophthalmic solution Place 1 drop into the left eye 3 (three) times daily. (Patient not taking: Reported on 09/17/2016) 15 mL 12   No facility-administered medications prior to visit.      Per HPI unless specifically indicated in ROS section below Review of Systems     Objective:    BP 126/62   Pulse 90   Temp 99 F (37.2 C) (Oral)   Wt 125 lb 8 oz (56.9 kg)   SpO2 97%   BMI 22.95 kg/m   Wt Readings from Last 3 Encounters:  09/17/16 125 lb 8 oz (56.9 kg)  08/15/16 121 lb 6.4 oz (55.1 kg)  07/17/16 120 lb 8 oz (54.7 kg)    Physical Exam  Constitutional: She appears well-developed and well-nourished. No distress.  HENT:  Head:  Normocephalic and atraumatic.  Right Ear: Hearing, tympanic membrane, external ear and ear canal normal.  Left Ear: Hearing, tympanic membrane, external ear and ear canal normal.  Nose: Mucosal edema present. No rhinorrhea. Right sinus exhibits no maxillary sinus tenderness and no frontal sinus tenderness. Left sinus exhibits no maxillary sinus tenderness and no frontal sinus tenderness.  Mouth/Throat: Uvula is midline, oropharynx is clear and moist and mucous membranes are normal. No oropharyngeal exudate, posterior oropharyngeal edema, posterior oropharyngeal erythema or tonsillar abscesses.  Nasal mucosal erythema  Eyes: Conjunctivae and EOM are normal. Pupils are equal, round, and reactive to light. No scleral icterus.  Neck: Normal range of motion. Neck supple.  Cardiovascular: Normal rate, regular rhythm, normal heart sounds and intact distal pulses.   No murmur heard. Pulmonary/Chest: Effort normal and breath sounds normal. No respiratory distress. She has no wheezes. She has no rales.  Lymphadenopathy:    She has no cervical adenopathy.  Skin: Skin is warm and dry. No rash noted.  Nursing note and vitals reviewed.  Results for orders placed or performed in visit on 09/17/16  POCT Urinalysis Dipstick (Automated)  Result Value Ref Range   Color, UA Yellow    Clarity, UA Cloudy    Glucose, UA Negative    Bilirubin, UA Negative    Ketones, UA Negative    Spec Grav, UA >=1.030 (A) 1.010 - 1.025   Blood, UA Trace    pH, UA 6.0 5.0 - 8.0   Protein, UA Negative    Urobilinogen, UA 0.2 0.2 or 1.0 E.U./dL   Nitrite, UA Negative    Leukocytes, UA Large (3+) (A) Negative       Assessment & Plan:   Problem List Items Addressed This Visit    Acute bronchitis - Primary    Harsh nagging cough, present for the past week, without significant other symptoms. No signs of bacterial infection at this time. Anticipate viral bronchitis. Supportive care reviewed as per instructions - rec plain  mucinex and delsym.  Red flags to seek further care reviewed.       Urinary frequency    Possibly increased urgency/frequency manifestation of autonomic changes post -stroke. Doubt infection - but pt and family request UA today to r/o UTI - will check today.  UA with 3+ LE - will send culture.       Relevant Orders   POCT Urinalysis Dipstick (Automated) (Completed)   Urine culture       Follow up plan: Return if symptoms worsen or fail to improve.  Ria Bush, MD

## 2016-09-17 NOTE — Addendum Note (Signed)
Addended by: Royann Shivers A on: 09/17/2016 05:28 PM   Modules accepted: Orders

## 2016-09-17 NOTE — Assessment & Plan Note (Signed)
Harsh nagging cough, present for the past week, without significant other symptoms. No signs of bacterial infection at this time. Anticipate viral bronchitis. Supportive care reviewed as per instructions - rec plain mucinex and delsym.  Red flags to seek further care reviewed.

## 2016-09-17 NOTE — Assessment & Plan Note (Addendum)
Possibly increased urgency/frequency manifestation of autonomic changes post -stroke. Doubt infection - but pt and family request UA today to r/o UTI - will check today.  UA with 3+ LE - will send culture.

## 2016-09-17 NOTE — Progress Notes (Signed)
Pre visit review using our clinic review tool, if applicable. No additional management support is needed unless otherwise documented below in the visit note. 

## 2016-09-17 NOTE — Patient Instructions (Addendum)
I think you have viral bronchitis. No need for antibiotics at this time.  Treat with fluids and rest. May take plain mucinex over the counter with large glass of water to help mobilize mucous Continue delsym at night time. Let us know if fever >101, worsening productive cough, or not improving with treatment.  Urinalysis today.   Acute Bronchitis, Adult Acute bronchitis is sudden (acute) swelling of the air tubes (bronchi) in the lungs. Acute bronchitis causes these tubes to fill with mucus, which can make it hard to breathe. It can also cause coughing or wheezing. In adults, acute bronchitis usually goes away within 2 weeks. A cough caused by bronchitis may last up to 3 weeks. Smoking, allergies, and asthma can make the condition worse. Repeated episodes of bronchitis may cause further lung problems, such as chronic obstructive pulmonary disease (COPD). What are the causes? This condition can be caused by germs and by substances that irritate the lungs, including:  Cold and flu viruses. This condition is most often caused by the same virus that causes a cold.  Bacteria.  Exposure to tobacco smoke, dust, fumes, and air pollution. What increases the risk? This condition is more likely to develop in people who:  Have close contact with someone with acute bronchitis.  Are exposed to lung irritants, such as tobacco smoke, dust, fumes, and vapors.  Have a weak immune system.  Have a respiratory condition such as asthma. What are the signs or symptoms? Symptoms of this condition include:  A cough.  Coughing up clear, yellow, or green mucus.  Wheezing.  Chest congestion.  Shortness of breath.  A fever.  Body aches.  Chills.  A sore throat. How is this diagnosed? This condition is usually diagnosed with a physical exam. During the exam, your health care provider may order tests, such as chest X-rays, to rule out other conditions. He or she may also:  Test a sample of your  mucus for bacterial infection.  Check the level of oxygen in your blood. This is done to check for pneumonia.  Do a chest X-ray or lung function testing to rule out pneumonia and other conditions.  Perform blood tests. Your health care provider will also ask about your symptoms and medical history. How is this treated? Most cases of acute bronchitis clear up over time without treatment. Your health care provider may recommend:  Drinking more fluids. Drinking more makes your mucus thinner, which may make it easier to breathe.  Taking a medicine for a fever or cough.  Taking an antibiotic medicine.  Using an inhaler to help improve shortness of breath and to control a cough.  Using a cool mist vaporizer or humidifier to make it easier to breathe. Follow these instructions at home: Medicines   Take over-the-counter and prescription medicines only as told by your health care provider.  If you were prescribed an antibiotic, take it as told by your health care provider. Do not stop taking the antibiotic even if you start to feel better. General instructions   Get plenty of rest.  Drink enough fluids to keep your urine clear or pale yellow.  Avoid smoking and secondhand smoke. Exposure to cigarette smoke or irritating chemicals will make bronchitis worse. If you smoke and you need help quitting, ask your health care provider. Quitting smoking will help your lungs heal faster.  Use an inhaler, cool mist vaporizer, or humidifier as told by your health care provider.  Keep all follow-up visits as told by your  health care provider. This is important. How is this prevented? To lower your risk of getting this condition again:  Wash your hands often with soap and water. If soap and water are not available, use hand sanitizer.  Avoid contact with people who have cold symptoms.  Try not to touch your hands to your mouth, nose, or eyes.  Make sure to get the flu shot every year. Contact  a health care provider if:  Your symptoms do not improve in 2 weeks of treatment. Get help right away if:  You cough up blood.  You have chest pain.  You have severe shortness of breath.  You become dehydrated.  You faint or keep feeling like you are going to faint.  You keep vomiting.  You have a severe headache.  Your fever or chills gets worse. This information is not intended to replace advice given to you by your health care provider. Make sure you discuss any questions you have with your health care provider. Document Released: 06/18/2004 Document Revised: 12/04/2015 Document Reviewed: 10/30/2015 Elsevier Interactive Patient Education  2017 [employer].

## 2016-09-18 ENCOUNTER — Telehealth: Payer: Self-pay

## 2016-09-18 ENCOUNTER — Other Ambulatory Visit: Payer: Self-pay | Admitting: Family Medicine

## 2016-09-18 DIAGNOSIS — M109 Gout, unspecified: Secondary | ICD-10-CM | POA: Diagnosis not present

## 2016-09-18 DIAGNOSIS — R1311 Dysphagia, oral phase: Secondary | ICD-10-CM | POA: Diagnosis not present

## 2016-09-18 DIAGNOSIS — R41841 Cognitive communication deficit: Secondary | ICD-10-CM | POA: Diagnosis not present

## 2016-09-18 DIAGNOSIS — I10 Essential (primary) hypertension: Secondary | ICD-10-CM | POA: Diagnosis not present

## 2016-09-18 DIAGNOSIS — E119 Type 2 diabetes mellitus without complications: Secondary | ICD-10-CM | POA: Diagnosis not present

## 2016-09-18 DIAGNOSIS — R350 Frequency of micturition: Secondary | ICD-10-CM | POA: Diagnosis not present

## 2016-09-18 DIAGNOSIS — I69354 Hemiplegia and hemiparesis following cerebral infarction affecting left non-dominant side: Secondary | ICD-10-CM | POA: Diagnosis not present

## 2016-09-18 NOTE — Telephone Encounter (Signed)
Left a message to call office

## 2016-09-18 NOTE — Telephone Encounter (Signed)
She tends to run a low normal blood pressure  Is she drinking enough fluids? - aim for 64 oz per day  Ok if she wants some salty food as well  Please continue to monitor and alert me if she develops any symptoms of infection/fever etc Thanks for the heads up

## 2016-09-18 NOTE — Telephone Encounter (Signed)
Lauren [name] with [facility] said when he was at pts home this morning BP was 100/48; T 97.5 and oxygen level was OK. [name] seemed slightly lethargic this morning. Please advise.

## 2016-09-18 NOTE — Telephone Encounter (Signed)
Izora Gala OT with [facility] left v/m; pt missed OT appt . Izora Gala understands from family that pt has pneumonia and possible UTI; Izora Gala request cb with what meds pt will be taking for these new dx and also does Dr Glori Bickers want to order [facility] skilled nursing.Izora Gala request cb.

## 2016-09-21 ENCOUNTER — Telehealth: Payer: Self-pay

## 2016-09-21 DIAGNOSIS — M109 Gout, unspecified: Secondary | ICD-10-CM | POA: Diagnosis not present

## 2016-09-21 DIAGNOSIS — I10 Essential (primary) hypertension: Secondary | ICD-10-CM | POA: Diagnosis not present

## 2016-09-21 DIAGNOSIS — E119 Type 2 diabetes mellitus without complications: Secondary | ICD-10-CM | POA: Diagnosis not present

## 2016-09-21 DIAGNOSIS — I69354 Hemiplegia and hemiparesis following cerebral infarction affecting left non-dominant side: Secondary | ICD-10-CM | POA: Diagnosis not present

## 2016-09-21 DIAGNOSIS — R1311 Dysphagia, oral phase: Secondary | ICD-10-CM | POA: Diagnosis not present

## 2016-09-21 DIAGNOSIS — R41841 Cognitive communication deficit: Secondary | ICD-10-CM | POA: Diagnosis not present

## 2016-09-21 LAB — URINE CULTURE

## 2016-09-21 MED ORDER — CEPHALEXIN 500 MG PO CAPS: 500.0000 mg | ORAL_CAPSULE | Freq: Two times a day (BID) | ORAL | 0 refills | Status: DC

## 2016-09-21 NOTE — Telephone Encounter (Signed)
She was given keflex for uti / bronchitis (you can send the progress note and urine culture result from Dr Darnell Level if they want it to review) I don't think she should need skilled nursing unless family tells me otherwise  Thanks for the update  Keep her eating and drinking

## 2016-09-21 NOTE — Telephone Encounter (Signed)
Spoke to [place] and advised her of instruction per Dr Darnell Level

## 2016-09-21 NOTE — Telephone Encounter (Signed)
I will see her then  

## 2016-09-21 NOTE — Telephone Encounter (Signed)
[facility] signed) left v/m requesting urine culture results and wants to know if can get abx today. Pt had bad weekend; pt had to sit up during the night due to cough and ?UTI. Linda request cb ASAP.[place]. Pt last seen 09/17/16.

## 2016-09-21 NOTE — Telephone Encounter (Signed)
See result note - UCx returned late this morning growing bacteria - will treat with keflex antibiotic sent to pharmacy. Update if not improved with treatment.

## 2016-09-21 NOTE — Telephone Encounter (Signed)
Lauren [name] notified as instructed by telephone and verbalized understanding.  Lauren [name] stated that patient is coming in to see Dr. Glori Bickers tomorrow and requested that she be checked out for the swelling in her feet which the care giver told Lauren [name] that this was not normal for her.

## 2016-09-21 NOTE — Telephone Encounter (Signed)
[place] notified as instructed by telephone and verbalized understanding. Patrick Jupiter stated that he talked with her about drinking fluids. Patrick Jupiter stated that he plans on seeing her again tomorrow.  See message also from [place] OT.

## 2016-09-21 NOTE — Telephone Encounter (Signed)
Left message on voicemail for Lauren [name] to call back.

## 2016-09-22 ENCOUNTER — Other Ambulatory Visit: Payer: Self-pay

## 2016-09-22 ENCOUNTER — Encounter: Payer: Self-pay | Admitting: Family Medicine

## 2016-09-22 ENCOUNTER — Ambulatory Visit (INDEPENDENT_AMBULATORY_CARE_PROVIDER_SITE_OTHER): Payer: Medicare Other | Admitting: Family Medicine

## 2016-09-22 ENCOUNTER — Telehealth: Payer: Self-pay | Admitting: Family Medicine

## 2016-09-22 ENCOUNTER — Ambulatory Visit (INDEPENDENT_AMBULATORY_CARE_PROVIDER_SITE_OTHER)
Admission: RE | Admit: 2016-09-22 | Discharge: 2016-09-22 | Disposition: A | Discharge status: Home / Self Care | Payer: Medicare Other | Source: Ambulatory Visit | Attending: Family Medicine | Admitting: Family Medicine

## 2016-09-22 VITALS — BP 128/74 | HR 88 | Temp 98.1°F | Ht 62.5 in | Wt 124.0 lb

## 2016-09-22 DIAGNOSIS — I519 Heart disease, unspecified: Secondary | ICD-10-CM

## 2016-09-22 DIAGNOSIS — N3000 Acute cystitis without hematuria: Secondary | ICD-10-CM

## 2016-09-22 DIAGNOSIS — E7800 Pure hypercholesterolemia, unspecified: Secondary | ICD-10-CM | POA: Diagnosis not present

## 2016-09-22 DIAGNOSIS — E1159 Type 2 diabetes mellitus with other circulatory complications: Secondary | ICD-10-CM

## 2016-09-22 DIAGNOSIS — R05 Cough: Secondary | ICD-10-CM | POA: Diagnosis not present

## 2016-09-22 DIAGNOSIS — I5189 Other ill-defined heart diseases: Secondary | ICD-10-CM

## 2016-09-22 DIAGNOSIS — N390 Urinary tract infection, site not specified: Secondary | ICD-10-CM | POA: Insufficient documentation

## 2016-09-22 DIAGNOSIS — J209 Acute bronchitis, unspecified: Secondary | ICD-10-CM

## 2016-09-22 DIAGNOSIS — I10 Essential (primary) hypertension: Secondary | ICD-10-CM

## 2016-09-22 DIAGNOSIS — I639 Cerebral infarction, unspecified: Secondary | ICD-10-CM | POA: Diagnosis not present

## 2016-09-22 DIAGNOSIS — C50912 Malignant neoplasm of unspecified site of left female breast: Secondary | ICD-10-CM | POA: Diagnosis not present

## 2016-09-22 MED ORDER — BENZONATATE 200 MG PO CAPS: 200.0000 mg | ORAL_CAPSULE | Freq: Three times a day (TID) | ORAL | 1 refills | Status: DC | PRN

## 2016-09-22 MED ORDER — PANTOPRAZOLE SODIUM 40 MG PO TBEC: 40.0000 mg | DELAYED_RELEASE_TABLET | Freq: Every day | ORAL | 11 refills | Status: DC

## 2016-09-22 MED ORDER — DOXYCYCLINE HYCLATE 100 MG PO TABS
100.0000 mg | ORAL_TABLET | Freq: Two times a day (BID) | ORAL | 0 refills | Status: DC
Start: 2016-09-22 — End: 2016-09-28

## 2016-09-22 NOTE — Progress Notes (Signed)
Subjective:    Patient ID: Lauren [name], female    DOB: 1927-12-10, 83 y.o.   MRN: [id number]  HPI Here for hospitalization and rehab stay for 83 yo female with complex medical hx   hosp from 3/24 to 3/27 for acute CVA Presented with progressive L sided weakness and slt facial droop MRI showed acute distal R anterior cerebral art territory infarction primarily inv the corpus callosum MRA neg for emergent large vessel occlusion 2D echo withDD EF 65-70% Carotid dopplers 1-29% plaque Neuro rec continue plavix and good bp and chol control  Lab Results  Component Value Date   HGBA1C 6.7 (H) 08/15/2016    Lab Results  Component Value Date   CHOL 135 08/16/2016   HDL 34 (L) 08/16/2016   LDLCALC 71 08/16/2016   LDLDIRECT 76.2 04/24/2014   TRIG 152 (H) 08/16/2016   CHOLHDL 4.0 08/16/2016    D/c to SNF for PT and OT  Has f/u with neuro Dr Lauren [name]   She was then seen 09/17/16 for acute bronchitis by Dr Lauren [name] just after release from SNF c/o cough with white sputum and also urinary frequency  Dx with viral bronchitis -supp care rev -expectorant Urine cx obt- was pos and treated with keflex   (Klebsiella)   She had bad cough last night and called 911 due to confusion/cough/sob  She reports a feeling of anxiety suddenly out of nowhere EMS came out and checked her out - thought she was anxious and did not need to go to the hospital  Cough- prod of white mucous (one day some yellow)  No wheezing Deep cough  Sob to move around  Pulse ox is 94% after exertion   Most likely not drinking enough fluids (perhaps 2  16 oz cups)  Had ankle swelling yesterday- much improved today   She does not notice a big difference from stroke - she is still weak in L leg per PT  They are still working on that  f/u with neuro is 5/2    Wt Readings from Last 3 Encounters:  09/22/16 124 lb (56.2 kg)  09/17/16 125 lb 8 oz (56.9 kg)  08/15/16 121 lb 6.4 oz (55.1 kg)   bp is stable today  It has been  up and down at home (100/46 one day, and 156/75)  No cp or palpitations or headaches or edema  No side effects to medicines  BP Readings from Last 3 Encounters:  09/22/16 128/74  09/17/16 126/62  08/18/16 112/66      Per pt -hospital took her off calcium and nexium  Put her on protonix (due to potential interaction with plavix)  Lost her sister while she was hospitalized  She is very at peace   Patient Active Problem List   Diagnosis Date Noted  . UTI (urinary tract infection) 09/22/2016  . Acute bronchitis 09/17/2016  . Urinary frequency 09/17/2016  . Acute CVA (cerebrovascular accident) ([place]) 08/15/2016  . HTN (hypertension) 08/15/2016  . TIA (transient ischemic attack) 02/28/2016  . GERD (gastroesophageal reflux disease) 08/06/2015  . Hearing loss 08/06/2015  . Estrogen deficiency 05/01/2014  . Left knee pain 09/22/2013  . Chest pain, musculoskeletal 08/05/2013  . Encounter for Medicare annual wellness exam 04/04/2013  . Colon cancer screening 04/04/2013  . History of melanoma excision 09/27/2012  . History of breast cancer, left 01/08/2011  . History of pelvic fracture 09/17/2010  . DEGENERATIVE JOINT DISEASE 02/13/2010  . Migraine variant 11/06/2009  . Osteopenia 10/18/2008  .  Breast cancer, left breast ([place]) 09/10/2008  . Transient ischemic attack 09/10/2008  . Diabetes type 2, controlled ([employer]) 10/06/2007  . VENOUS INSUFFICIENCY 10/06/2007  . Diverticulosis of colon 10/06/2007  . FIBROCYSTIC BREAST DISEASE 10/06/2007  . HYPERCHOLESTEROLEMIA, PURE 11/29/2006   Past Medical History:  Diagnosis Date  . Breast cancer ([place])    left breast  . Chronic constipation   . Chronic headaches   . Diabetes mellitus   . Fibrocystic breast   . Gastritis   . Gout    injection in the past  . Hyperlipidemia   . Melanoma in situ of left upper extremity ([place])   . Pelvic fracture ([place])   . TIA (transient ischemic attack) 08/2008   neg MRI/MRA and CT and carotid dopplers  .  Venous insufficiency    Past Surgical History:  Procedure Laterality Date  . APPENDECTOMY    . BREAST CYST ASPIRATION    . BREAST LUMPECTOMY  09/2007   with sentinel LN biopsy  . BREAST LUMPECTOMY     left  . EYE SURGERY     retinal, then cataract   Social History  Substance Use Topics  . Smoking status: Never Smoker  . Smokeless tobacco: Never Used  . Alcohol use No   Family History  Problem Relation Age of Onset  . Breast cancer Sister   . Stroke Sister   . Cancer Sister     breast  . Breast cancer Sister   . Stroke Sister   . Cancer Sister     breast  . Stroke Mother   . Stroke Father   . Stroke Brother   . Cancer Brother     prostate  . Stroke Brother   . Stroke Sister   . CAD Sister    Allergies  Allergen Reactions  . Sulfonamide Derivatives Itching  . Aspirin Other (See Comments)    Burning and pain in stomach   Current Outpatient Prescriptions on File Prior to Visit  Medication Sig Dispense Refill  . acetaminophen (TYLENOL) 325 MG tablet Take 2 tablets (650 mg total) by mouth every 6 (six) hours as needed for mild pain (or Fever >/= 101).    . calcium carbonate (TUMS - DOSED IN MG ELEMENTAL CALCIUM) 500 MG chewable tablet Chew 1 tablet by mouth as needed for heartburn.     . cephALEXin (KEFLEX) 500 MG capsule Take 1 capsule (500 mg total) by mouth 2 (two) times daily. 14 capsule 0  . citalopram (CELEXA) 10 MG tablet Take 0.5 tablets (5 mg total) by mouth daily. 15 tablet 11  . citalopram (CELEXA) 10 MG tablet TAKE 1 TABLET BY MOUTH DAILY 30 tablet 1  . clopidogrel (PLAVIX) 75 MG tablet TAKE ONE TABLET BY MOUTH EVERY MORNING WITH BREAKFAST. 30 tablet 1  . fish oil-omega-3 fatty acids 1000 MG capsule Take 1 g by mouth daily.      Marland Kitchen glucose blood (ONE TOUCH ULTRA TEST) test strip USE AS DIRECTED TO CHECK BLOOD SUGAR ONCE A DAY AND AS NEEDED (DX. E11.9) 100 each 1  . hydroxypropyl methylcellulose / hypromellose (ISOPTO TEARS / GONIOVISC) 2.5 % ophthalmic  solution Place 1 drop into the left eye 3 (three) times daily. 15 mL 12  . hydrOXYzine (ATARAX/VISTARIL) 10 MG tablet Take 1 tablet (10 mg total) by mouth at bedtime as needed for itching. 30 tablet 1  . metFORMIN (GLUCOPHAGE) 500 MG tablet Take 0.5 tablets (250 mg total) by mouth 2 (two) times daily with a meal. 90  tablet 3  . Multiple Vitamins-Minerals (PRESERVISION AREDS 2 PO) Take 1 tablet by mouth 2 (two) times daily.    . pantoprazole (PROTONIX) 40 MG tablet Take 1 tablet (40 mg total) by mouth daily. 30 tablet 3  . polyethylene glycol powder (GLYCOLAX/MIRALAX) powder DISSOLVE ONE (1) CAPFUL (17GM) INTO 4 TO8 OUNCES OF FLUID AND TAKE BYMOUTH ONCE DAILY AS DIRECTED 527 g 0  . ranitidine (ZANTAC) 300 MG tablet Take 1 tablet (300 mg total) by mouth daily as needed for heartburn. 30 tablet 11  . simethicone (MYLICON) 80 MG chewable tablet Chew 80 mg by mouth every 6 (six) hours as needed for flatulence.    . simvastatin (ZOCOR) 20 MG tablet TAKE ONE TABLET BY MOUTH EVERY NIGHT AT BEDTIME 30 tablet 5   No current facility-administered medications on file prior to visit.     Review of Systems Review of Systems  Constitutional: Negative for fever, appetite change,  and unexpected weight change.  Eyes: Negative for pain and visual disturbance.  ENT pos for pnd Respiratory: pos for cough and shortness of breath.  neg for wheezing  Cardiovascular: Negative for cp or palpitations   pos for pedal edema yesterday that is better Gastrointestinal: Negative for nausea, diarrhea and constipation.  Genitourinary: pos for urgency and frequency. neg for dysuria or hematuria Skin: Negative for pallor or rash   Neurological: Negative for, light-headedness, numbness and headaches. pos for L sided weakness from cva that is improving  Hematological: Negative for adenopathy. Does bruise/bleed easily.  Psychiatric/Behavioral: Negative for dysphoric mood. The patient is sometimes  nervous/anxious.           Objective:   Physical Exam  Constitutional: She appears well-developed and well-nourished. No distress.  Frail appearing elderly female   HENT:  Head: Normocephalic and atraumatic.  Mouth/Throat: Oropharynx is clear and moist. No oropharyngeal exudate.  No facial droop  Eyes: Conjunctivae and EOM are normal. Pupils are equal, round, and reactive to light. Right eye exhibits no discharge. Left eye exhibits no discharge. No scleral icterus.  Neck: Normal range of motion. Neck supple. No JVD present. Carotid bruit is not present. No thyromegaly present.  Cardiovascular: Normal rate, regular rhythm, normal heart sounds and intact distal pulses.  Exam reveals no gallop.   No murmur heard. Pulmonary/Chest: Effort normal and breath sounds normal. No respiratory distress. She has no wheezes. She has no rales.  No crackles  Harsh bs Few scattered rhonchi Good air exch/ no wheeze Deep bronchial sounding cough that is productive  No crackles or rales   Abdominal: Soft. Bowel sounds are normal. She exhibits no distension, no abdominal bruit and no mass. There is no tenderness.  Musculoskeletal: She exhibits no edema or tenderness.  Lymphadenopathy:    She has no cervical adenopathy.  Neurological: She is alert. She has normal reflexes. No cranial nerve deficit. She exhibits normal muscle tone. Coordination normal.  Reassuring exam  Walks with a walker  L sided UE and LE strength is improved  Skin: Skin is warm and dry. No rash noted. No pallor.  Psychiatric: She has a normal mood and affect.  Mentally sharp today  Talkative and positive Disc grief over loss of sister-thinks she is at peace and doing well           Assessment & Plan:   Problem List Items Addressed This Visit      Cardiovascular and Mediastinum   Acute CVA (cerebrovascular accident) [facility])    Reviewed hospital records, lab results  and studies in detail   R side inv corpus callosum in R ant cereb art territory MRA  re assuring 2d echo and carotids re assuring  Declines loop recorder due to age  For neuro f/u tomorrow  Continues plavix  nexium was changed to protonix in hospital to minimize interaction  Exam reassuring - L weakness improved and no facial droop  Will work to control lipids and BP      HTN (hypertension) - Primary    This has been labile in the setting of recent stroke and illness BP: 128/74  Continue to follow         Respiratory   Acute bronchitis    Rev note from Dr Lauren [name] On keflex for uti  Still coughing significantly  cxr ordered -pending  Px tessalon  Reassuring exam but cough is very deep and productive Pulse ox 94% on exertion  Will tx based on cxr      Relevant Orders   DG Chest 2 View (Completed)     Endocrine   Diabetes type 2, controlled ([place])    Lab Results  Component Value Date   HGBA1C 6.7 (H) 08/15/2016   Glucose levels have stabilized Appetite is fair        Genitourinary   UTI (urinary tract infection)    Klebsiella Started keflex Enc water intake         Other   Breast cancer, left breast ([place])    Continues f/u No re occurance      Diastolic dysfunction    Grade 1 diastolic dysfunction on echo in hospital  Per pt- pedal edema yesterday which is gone  Will continue to follow       HYPERCHOLESTEROLEMIA, PURE    LDL is 71 (goal is 70 or below) HDL low at 34 On simvastatin and diet

## 2016-09-22 NOTE — Assessment & Plan Note (Addendum)
This has been labile in the setting of recent stroke and illness BP: 128/74  Continue to follow

## 2016-09-22 NOTE — Telephone Encounter (Signed)
Linda notified of xray results and Dr. Marliss Coots instructions, she will pick up abx and f/u appt scheduled

## 2016-09-22 NOTE — Telephone Encounter (Signed)
Please let pt and caregiver know that her xray shows a possible infiltrate (pneumonia) in the lingula  I need to cover her for this  Continue your keflex for uti (this will also help) Add doxycycline- one pill twice daily -I sent it to [facility]  Follow up with me for a re check and cxr in approx 3 weeks but alert me sooner if no improvement in cough and other symptoms

## 2016-09-22 NOTE — Progress Notes (Signed)
Pre visit review using our clinic review tool, if applicable. No additional management support is needed unless otherwise documented below in the visit note. 

## 2016-09-22 NOTE — Telephone Encounter (Signed)
I sent it electronically  

## 2016-09-22 NOTE — Telephone Encounter (Signed)
Amy from [facility] could not find refill of pantoprazole from hospital physician to replace nexium. Amy wants to know if Dr Glori Bickers would send pantoprazole to [facility].

## 2016-09-22 NOTE — Patient Instructions (Signed)
Continue the keflex for the UTI Drink more water please  Continue your therapy  Chest xray today-we will call you with the result  Also I send tessalon for cough  Breathing steam is ok if it helps    Check on protonix when you get home   See neurology as planned

## 2016-09-23 ENCOUNTER — Encounter: Payer: Self-pay | Admitting: Neurology

## 2016-09-23 ENCOUNTER — Ambulatory Visit (INDEPENDENT_AMBULATORY_CARE_PROVIDER_SITE_OTHER): Payer: Medicare Other | Admitting: Neurology

## 2016-09-23 VITALS — BP 132/60 | HR 98 | Resp 14 | Ht 62.5 in | Wt 124.0 lb

## 2016-09-23 DIAGNOSIS — J181 Lobar pneumonia, unspecified organism: Secondary | ICD-10-CM | POA: Diagnosis not present

## 2016-09-23 DIAGNOSIS — J189 Pneumonia, unspecified organism: Secondary | ICD-10-CM

## 2016-09-23 DIAGNOSIS — I5189 Other ill-defined heart diseases: Secondary | ICD-10-CM | POA: Insufficient documentation

## 2016-09-23 DIAGNOSIS — N390 Urinary tract infection, site not specified: Secondary | ICD-10-CM

## 2016-09-23 DIAGNOSIS — I639 Cerebral infarction, unspecified: Secondary | ICD-10-CM

## 2016-09-23 DIAGNOSIS — I69359 Hemiplegia and hemiparesis following cerebral infarction affecting unspecified side: Secondary | ICD-10-CM

## 2016-09-23 NOTE — Assessment & Plan Note (Signed)
Continues f/u No re occurance

## 2016-09-23 NOTE — Progress Notes (Signed)
Subjective:    Patient ID: Lauren [name] is a 83 y.o. female.  HPI     Interim history:   Lauren [name] is a very pleasant 83 year old right-handed woman with an underlying medical history of type 2 diabetes, melanoma, history of breast cancer, pelvic fracture, osteopenia, TIAs, venous insufficiency, diverticulosis, hyperlipidemia who presents for a follow-up consultation of her history of TIA and recent hospitalization for acute stroke. The patient is accompanied by her 2 nieces today. I last saw her on 03/19/2016 after she was hospitalized for TIA. She had full stroke workup in the hospital in October 2017. We reviewed her test results and hospital records at the time. She was stable at the time. She did have 2 interim episodes of problems concentrating and getting her words out. Her discharge diagnosis in October 2017 was TIA versus migraine variant.  She presented to the hospital on 08/15/2016 with left-sided weakness including left facial droop. She had a brain MRI which showed acute right anterior cerebral artery territory infarct, primarily involving the corpus callosum. MRA head was negative for large vessel occlusion, echocardiogram showed EF of 97-67, grade 1 diastolic dysfunction, carotid Doppler studies showed less than 40% ICA plaquing, her therapist recommended skilled nursing facility upon discharge. She was advised to continue Plavix, LDL was 71, hemoglobin A1c was 6.7. A loop recorder was recommended but patient decided against it. She was seen by cardiology.  Today, 09/23/2016 (all dictated new, as well as above notes, some dictation done in note pad or Word, outside of chart, may appear as copied):  She reports doing okay. But currently on Rx for pneumonia and UTI. She was at [facility] from 08/18/16 through 4/19, home since then, but increase in urination got worsen and started to have AMS and SOB, they called 911 2 nights ago. Had elevated BP. She is now on Keflex for her UTI and  doxycycline for her pneumonia. Her x-ray from yesterday showed concern for pneumonia in the lingula. She is perpetually coughing. Overall, she has recuperated well from the stroke but had a setback with her acute illness. She is in good spirits. Appetite is good. She has great family support, her niece lives across from her and the other niece lives close by, other nieces and nephews and extended family, and visit, everybody sends food and church friends and other friends are also checking in on her. She has a call alert button. She is walking with a walker. She has been scheduled for home health speech therapy but they are delaying this because of her cough. In addition, she has home health occupational therapy which is helpful for her bathing and she also has a caretaker twice a week for 4 hours. When she was hospitalized for her stroke her sister died at age 11, sister had advanced dementia and also was one of my patients. Lauren [name] is holding up okay as far as her sister's loss.   The patient's allergies, current medications, family history, past medical history, past social history, past surgical history and problem list were reviewed and updated as appropriate.   Previously (copied from previous notes for reference):   03/19/2016: She was in the hospital on 02/28/2016 and discharged on 03/02/2016. She presented with difficulty with her speech and shakiness. She did recall that she had a similar episode in 2014. She had an EEG in 2014 which was normal in the awake state. She had an EEG on 03/02/2016 and I reviewed the results: Impression: This awake and drowsy EEG  is normal. CT head without contrast was negative for any acute abnormalities, CTA neck was negative for any significant stenosis or occlusion. She had no need for physical therapy and occupational therapy after being evaluated by OT and PT. She was set up for 30 day cardiac monitoring as an outpatient.  She did not have a HA at the time, but in  2014 she did have an associated HA.    I reviewed the scan results:  Ct Angio Head and Neck W Or Wo Contrast 02/28/2016 1. Negative for emergent large vessel occlusion.  2. Minimal atherosclerosis in the neck without stenosis. No anterior circulation large vessel or first order vessel stenosis.  3. Moderate to severe stenosis in the left ACA pericallosal artery and bilateral MCA distal posterior M2 or M3 branches.  4. Mild to moderate stenosis at the right PCA origin is new since 2014. Moderate to severe right P2 stenoses are new or progressed since 2014.  5. Mild to moderate aortic arch calcified atherosclerosis.    Ct Head Code Stroke W/o Cm 02/28/2016 1. Stable CT appearance of chronic small vessel disease. No acute intracranial hemorrhage or acute cortically based infarct identified.  2. ASPECTS is 10.    MR MRA Head without Contrast 02/29/2016 No acute infarction. Moderate chronic small-vessel ischemic changes of the cerebral hemispheric deep white matter. No intracranial major vessel occlusion or correctable proximal stenosis. Distal vessel atherosclerotic narrowing and irregularity most notable in multiple M2 segments bilaterally and in the PCAs, right more than left.   She had a TTE on 03/02/16 (Dr. Meda Coffee interpreted): Study Conclusions   - Left ventricle: The cavity size was normal. There was mild   concentric hypertrophy. Systolic function was vigorous. The   estimated ejection fraction was in the range of 65% to 70%. Wall   motion was normal; there were no regional wall motion   abnormalities. Doppler parameters are consistent with abnormal   left ventricular relaxation (grade 1 diastolic dysfunction).   Doppler parameters are consistent with elevated ventricular   end-diastolic filling pressure. - Aortic valve: Trileaflet; normal thickness leaflets. There was   mild regurgitation. - Aortic root: The aortic root was normal in size. - Mitral valve: Calcified annulus. Mildly  thickened leaflets .   There was mild regurgitation. - Left atrium: The atrium was normal in size. - Right ventricle: Systolic function was normal. - Tricuspid valve: There was mild regurgitation. - Pulmonary arteries: Systolic pressure was within the normal   range. - Inferior vena cava: The vessel was normal in size. - Pericardium, extracardiac: There was no pericardial effusion.   02/20/13:   I first met her on 11/17/2012 and she reported a TIA-like spell earlier in June and was seen in the hospital by my colleague Dr. Leonie Man. He changed her aspirin to Plavix and held her metformin due to low blood sugar values. She reported being in her normal state of health during the late moring on 10/27/12 when she had sudden onset of R hand and arm shaking, which she not control, and she could not get her words out. Her sister's caregiver called 911. She had a full stroke w/u in the hospital which I reviewed: Her CT showed a small plaque in the MCA M2 branch and white matter changes, she had negative carotid Doppler studies, there was no acute stroke on MRI brain and she had intracranial atherosclerosis on MRA head. Her blood work was unremarkable, with normal lipids, A1c of 6.3. Carotid doppler study in June  2014 showed 0-49% ICA stenosis. She had transient neurological symptoms including difficulty forming her words and right arm shakiness, no facial droop. She has a long-standing history of migraine headaches, but also c/o a bandlike sensation in the front. For her headaches she takes tylenol. She was given neurontin by a HA specialist in the past, but it was not helpful.  Since she was discharged she has had more hand tremor and intermittent zoning out at time, no frank convulsions. She has had a hand tremor for years. Her 2 nephews have tremors.  Since her last visit she presented to the emergency room on 01/11/2013 with chest pain, relieved by nitroglycerin sublingual. An acute coronary syndrome was  excluded. She had a stress test: On 01/12/2013 she had a normal Lexi scan Myoview. She had a chest x-ray on 01/11/2013 which was negative for any acute or active cardiopulmonary disease. She had a head CT without contrast on 12/28/2012 which showed no acute intracranial abnormalities, near-complete opacification of the right sphenoid sinus, mild cerebral atrophy with extensive chronic microvascular ischemic changes. On 12/22/2012 she had a bilateral mammogram showing stable post lumpectomy site in the left breast and probably benign calcifications in the upper outer right breast which appear punctate and loosely grouped.  She saw her primary care physician on 01/25/2013 and endorsed anxiety and stressors. At the time of her first visit with me I ordered an EEG. This was normal in the awake state. I felt that her headaches were primarily tension-type headaches. She also had evidence of essential tremor, very mild. She is doing better in all aspect, she is starting to gain some weight back.   Her Past Medical History Is Significant For: Past Medical History:  Diagnosis Date  . Breast cancer (HCC)    left breast  . Chronic constipation   . Chronic headaches   . Diabetes mellitus   . Fibrocystic breast   . Gastritis   . Gout    injection in the past  . Hyperlipidemia   . Melanoma in situ of left upper extremity (HCC)   . Pelvic fracture (HCC)   . TIA (transient ischemic attack) 08/2008   neg MRI/MRA and CT and carotid dopplers  . Venous insufficiency     Her Past Surgical History Is Significant For: Past Surgical History:  Procedure Laterality Date  . APPENDECTOMY    . BREAST CYST ASPIRATION    . BREAST LUMPECTOMY  09/2007   with sentinel LN biopsy  . BREAST LUMPECTOMY     left  . EYE SURGERY     retinal, then cataract    Her Family History Is Significant For: Family History  Problem Relation Age of Onset  . Breast cancer Sister   . Stroke Sister   . Cancer Sister     breast   . Breast cancer Sister   . Stroke Sister   . Cancer Sister     breast  . Stroke Mother   . Stroke Father   . Stroke Brother   . Cancer Brother     prostate  . Stroke Brother   . Stroke Sister   . CAD Sister     Her Social History Is Significant For: Social History   Social History  . Marital status: Single    Spouse name: N/A  . Number of children: N/A  . Years of education: college   Social History Main Topics  . Smoking status: Never Smoker  . Smokeless tobacco: Never Used  . Alcohol  use No  . Drug use: No  . Sexual activity: Not Currently   Other Topics Concern  . None   Social History Narrative   Lives with and cares for both sisters   Strong faith   Denies caffeine use     Her Allergies Are:  Allergies  Allergen Reactions  . Sulfonamide Derivatives Itching  . Aspirin Other (See Comments)    Burning and pain in stomach  :   Her Current Medications Are:  Outpatient Encounter Prescriptions as of 09/23/2016  Medication Sig  . acetaminophen (TYLENOL) 325 MG tablet Take 2 tablets (650 mg total) by mouth every 6 (six) hours as needed for mild pain (or Fever >/= 101).  . benzonatate (TESSALON) 200 MG capsule Take 1 capsule (200 mg total) by mouth 3 (three) times daily as needed. Swallow whole-do not bite pill  . calcium carbonate (TUMS - DOSED IN MG ELEMENTAL CALCIUM) 500 MG chewable tablet Chew 1 tablet by mouth as needed for heartburn.   . cephALEXin (KEFLEX) 500 MG capsule Take 1 capsule (500 mg total) by mouth 2 (two) times daily.  . citalopram (CELEXA) 10 MG tablet Take 0.5 tablets (5 mg total) by mouth daily.  . citalopram (CELEXA) 10 MG tablet TAKE 1 TABLET BY MOUTH DAILY  . clopidogrel (PLAVIX) 75 MG tablet TAKE ONE TABLET BY MOUTH EVERY MORNING WITH BREAKFAST.  Marland Kitchen dextromethorphan (DELSYM) 30 MG/5ML liquid Take 30 mg by mouth as needed for cough.  . doxycycline (VIBRA-TABS) 100 MG tablet Take 1 tablet (100 mg total) by mouth 2 (two) times daily.  . fish  oil-omega-3 fatty acids 1000 MG capsule Take 1 g by mouth daily.    Marland Kitchen glucose blood (ONE TOUCH ULTRA TEST) test strip USE AS DIRECTED TO CHECK BLOOD SUGAR ONCE A DAY AND AS NEEDED (DX. E11.9)  . guaiFENesin (MUCINEX) 600 MG 12 hr tablet Take by mouth 2 (two) times daily.  . hydrOXYzine (ATARAX/VISTARIL) 10 MG tablet Take 1 tablet (10 mg total) by mouth at bedtime as needed for itching.  . metFORMIN (GLUCOPHAGE) 500 MG tablet Take 0.5 tablets (250 mg total) by mouth 2 (two) times daily with a meal.  . Multiple Vitamins-Minerals (PRESERVISION AREDS 2 PO) Take 1 tablet by mouth 2 (two) times daily.  . pantoprazole (PROTONIX) 40 MG tablet Take 1 tablet (40 mg total) by mouth daily.  . polyethylene glycol powder (GLYCOLAX/MIRALAX) powder DISSOLVE ONE (1) CAPFUL (17GM) INTO 4 TO8 OUNCES OF FLUID AND TAKE BYMOUTH ONCE DAILY AS DIRECTED  . ranitidine (ZANTAC) 300 MG tablet Take 1 tablet (300 mg total) by mouth daily as needed for heartburn.  . simethicone (MYLICON) 80 MG chewable tablet Chew 80 mg by mouth every 6 (six) hours as needed for flatulence.  . simvastatin (ZOCOR) 20 MG tablet TAKE ONE TABLET BY MOUTH EVERY NIGHT AT BEDTIME  . [DISCONTINUED] hydroxypropyl methylcellulose / hypromellose (ISOPTO TEARS / GONIOVISC) 2.5 % ophthalmic solution Place 1 drop into the left eye 3 (three) times daily.   No facility-administered encounter medications on file as of 09/23/2016.   :  Review of Systems:  Out of a complete 14 point review of systems, all are reviewed and negative with the exception of these symptoms as listed below: Review of Systems  Neurological:       [place] would like to discuss her recent stroke that occurred 08/15/16. Currently has pneumonia and UTI.     Objective:  Neurologic Exam  Physical Exam Physical Examination:   Vitals:  09/23/16 0919  BP: 132/60  Pulse: 98  Resp: 14   General Examination: The patient is a very pleasant 83 y.o. female in no acute distress. She appears  Somewhat frail and deconditioned. She is wearing a facemask. She has multiple bouts of cough.  HEENT: Normocephalic, atraumatic, pupils are equal, round and reactive to light and accommodation. Extraocular tracking is good without limitation to gaze excursion or nystagmus noted. Normal smooth pursuit is noted. Hearing is grossly intact. Face is symmetric with no clear-cut evidence of facial asymmetry. She had left facial weakness when she had the stroke. Her speech is mildly hypophonic and raspy but otherwise no dysarthria is noted. She has mild mouth dryness. Tongue is central.   Chest: Clear to auscultation without wheezing or crackles on the right, some crackles noted on the left base.  Heart: S1+S2+0, regular and normal without murmurs, rubs or gallops noted.   Abdomen: Soft, non-tender and non-distended with normal bowel sounds appreciated on auscultation.  Extremities: There is no pitting edema in the distal lower extremities bilaterally. Pedal pulses are intact.  Skin: Warm and dry without trophic changes noted. There are no varicose veins.  Musculoskeletal: exam reveals no obvious joint deformities, tenderness or joint swelling or erythema.   Neurologically:  Mental status: The patient is awake, alert and oriented in all 4 spheres. Her memory, attention, language and knowledge are appropriate. There is no aphasia, agnosia, apraxia or anomia. Speech is clear with normal prosody and enunciation. Thought process is linear. Mood is congruent and affect is normal.  Cranial nerves are as described above under HEENT exam. In addition, shoulder shrug is normal with equal shoulder height noted. Motor exam: thin bulk, global strength of 4+ out of 5, no significant lateralization of her weakness. In fact, left hip flexors slightly better than right. She has no tremor. Romberg is not testable safely. Reflexes are 1-2+ symmetrical. Fine motor skills are globally mildly impaired. She has no  intention tremor. Sensory exam is intact to light touch.  Gait, station and balance: She stands up with mild difficulty  and has to push her self up. She requires minimal assistance. She uses a 2 wheeled walker, walks slowly and cautiously but otherwise no one-sided weakness or limp noted.   Assessment and Plan:   In summary, Lauren [name] is a very pleasant 83 year old female with an history of headaches, tremors, and Hx of TIAs, recent right ACA territory stroke, family history of dementia, family history of stroke  who presents for follow-up consultation after her recent hospitalization for acute stroke with left-sided weakness. Thankfully, she has recuperated well from her stroke but had a recent setback last week with acute illness including UTI and then pneumonia. She is on antibiotics for this. She has excellent family support and is extremely fortunate in that regard. She also has a call alert button. She had full stroke workup. She was advised to stay on Plavix and her other medications. I will consult with Dr. Erlinda Hong regarding dual antiplatelet therapy. He had a discussion with patient's niece during the hospital stay and given her history of stomach pain and sensitivity, having to use Nexium daily, they mutually agreed to hold off on aspirin plus Plavix. Weighing the risks against the benefits, it may be more reasonable to stay with just Plavix at this time, her family and patient agrees. Nevertheless, I will run this by Dr. Erlinda Hong again as well. She is on simvastatin. A1c was less than 7 which is her  goal. I reviewed all her hospital records from her recent admission. We talked about secondary stroke prevention. Her goal for A1c is less than 7. Her LDL goal is less than 70, she is to continue with her statin. Long-term blood pressure control is of course in the normotensive range. 30 day cardiac monitor late last year showed no evidence of A. fib and the loop recorder was declined during the hospital  stay. Patient is to continue with Plavix. Her risk factors also include advanced age and family history of stroke. I encouraged her to stay well-hydrated with water. She has occupational therapy come in tomorrow. They are encouraged to delay her home health physical therapy and speech therapy until next week and her hair appointment till next week as well. I will see her back in 3 months, sooner as needed. I answered all their questions today and they were in agreement. I spent 40 minutes in total face-to-face time with the patient, more than 50% of which was spent in counseling and coordination of care, reviewing test results, reviewing medication and discussing or reviewing the diagnosis of stroke, its prognosis and treatment options. Pertinent laboratory and imaging test results that were available during this visit with the patient were reviewed by me and considered in my medical decision making (see chart for details).

## 2016-09-23 NOTE — Assessment & Plan Note (Signed)
Rev note from Dr Darnell Level On keflex for uti  Still coughing significantly  cxr ordered -pending  Px tessalon  Reassuring exam but cough is very deep and productive Pulse ox 94% on exertion  Will tx based on cxr

## 2016-09-23 NOTE — Assessment & Plan Note (Signed)
Grade 1 diastolic dysfunction on echo in hospital  Per pt- pedal edema yesterday which is gone  Will continue to follow

## 2016-09-23 NOTE — Assessment & Plan Note (Signed)
LDL is 71 (goal is 70 or below) HDL low at 34 On simvastatin and diet

## 2016-09-23 NOTE — Assessment & Plan Note (Signed)
Klebsiella Started keflex Enc water intake

## 2016-09-23 NOTE — Patient Instructions (Signed)
Continue exercising by way of walking with your walker, delay HH PT and ST for now, OT may help with bathing.  Take your medications as directed. As discussed, secondary prevention is key after a stroke. This means: taking care of blood sugar values or diabetes management, good blood pressure (hypertension) control and optimizing cholesterol management, exercising daily or regularly within your own mobility limitations of course, and overall cardiovascular risk factor reduction.  I will ask Dr. Erlinda Hong, if we should consider adding baby aspirin.  You will feel better after your are over the pneumonia/cough and UTI.  Please drink plenty of water.

## 2016-09-23 NOTE — Assessment & Plan Note (Signed)
Reviewed hospital records, lab results and studies in detail   R side inv corpus callosum in R ant cereb art territory MRA re assuring 2d echo and carotids re assuring  Declines loop recorder due to age  For neuro f/u tomorrow  Continues plavix  nexium was changed to protonix in hospital to minimize interaction  Exam reassuring - L weakness improved and no facial droop  Will work to control lipids and BP

## 2016-09-23 NOTE — Assessment & Plan Note (Signed)
Lab Results  Component Value Date   HGBA1C 6.7 (H) 08/15/2016   Glucose levels have stabilized Appetite is fair

## 2016-09-24 ENCOUNTER — Telehealth: Payer: Self-pay | Admitting: Neurology

## 2016-09-24 DIAGNOSIS — M109 Gout, unspecified: Secondary | ICD-10-CM | POA: Diagnosis not present

## 2016-09-24 DIAGNOSIS — R41841 Cognitive communication deficit: Secondary | ICD-10-CM | POA: Diagnosis not present

## 2016-09-24 DIAGNOSIS — E119 Type 2 diabetes mellitus without complications: Secondary | ICD-10-CM | POA: Diagnosis not present

## 2016-09-24 DIAGNOSIS — I69354 Hemiplegia and hemiparesis following cerebral infarction affecting left non-dominant side: Secondary | ICD-10-CM | POA: Diagnosis not present

## 2016-09-24 DIAGNOSIS — R1311 Dysphagia, oral phase: Secondary | ICD-10-CM | POA: Diagnosis not present

## 2016-09-24 DIAGNOSIS — I10 Essential (primary) hypertension: Secondary | ICD-10-CM | POA: Diagnosis not present

## 2016-09-24 NOTE — Telephone Encounter (Signed)
Please call patient or niece. I conferred with Dr. Erlinda Hong regarding plavix only vs. plavix plus baby ASA, and we will continue with Plavix only. So, no change.

## 2016-09-24 NOTE — Telephone Encounter (Signed)
I spoke to patient and gave information below

## 2016-09-25 ENCOUNTER — Telehealth: Payer: Self-pay

## 2016-09-25 DIAGNOSIS — E119 Type 2 diabetes mellitus without complications: Secondary | ICD-10-CM | POA: Diagnosis not present

## 2016-09-25 DIAGNOSIS — M109 Gout, unspecified: Secondary | ICD-10-CM | POA: Diagnosis not present

## 2016-09-25 DIAGNOSIS — R41841 Cognitive communication deficit: Secondary | ICD-10-CM | POA: Diagnosis not present

## 2016-09-25 DIAGNOSIS — I69354 Hemiplegia and hemiparesis following cerebral infarction affecting left non-dominant side: Secondary | ICD-10-CM | POA: Diagnosis not present

## 2016-09-25 DIAGNOSIS — R1311 Dysphagia, oral phase: Secondary | ICD-10-CM | POA: Diagnosis not present

## 2016-09-25 DIAGNOSIS — I10 Essential (primary) hypertension: Secondary | ICD-10-CM | POA: Diagnosis not present

## 2016-09-25 NOTE — Telephone Encounter (Signed)
Verbal order given  

## 2016-09-25 NOTE — Telephone Encounter (Signed)
Please ok that verbal order  

## 2016-09-25 NOTE — Telephone Encounter (Signed)
Lauren [name] with [facility] left v/m requesting verbal order for make up appt for [facility] PT x 1.

## 2016-09-28 ENCOUNTER — Telehealth: Payer: Self-pay | Admitting: *Deleted

## 2016-09-28 DIAGNOSIS — I69354 Hemiplegia and hemiparesis following cerebral infarction affecting left non-dominant side: Secondary | ICD-10-CM | POA: Diagnosis not present

## 2016-09-28 DIAGNOSIS — R41841 Cognitive communication deficit: Secondary | ICD-10-CM | POA: Diagnosis not present

## 2016-09-28 DIAGNOSIS — R1311 Dysphagia, oral phase: Secondary | ICD-10-CM | POA: Diagnosis not present

## 2016-09-28 DIAGNOSIS — M109 Gout, unspecified: Secondary | ICD-10-CM | POA: Diagnosis not present

## 2016-09-28 DIAGNOSIS — I10 Essential (primary) hypertension: Secondary | ICD-10-CM | POA: Diagnosis not present

## 2016-09-28 DIAGNOSIS — E119 Type 2 diabetes mellitus without complications: Secondary | ICD-10-CM | POA: Diagnosis not present

## 2016-09-28 MED ORDER — DOXYCYCLINE HYCLATE 100 MG PO TABS: 100.0000 mg | ORAL_TABLET | Freq: Two times a day (BID) | ORAL | 0 refills | Status: DC

## 2016-09-28 NOTE — Telephone Encounter (Signed)
Do continue the delsym and mucinex  Also continue doxycycline for 5 more days- I sent that to [facility] Alert me if sob or fever or other symptoms  Thanks  Not unusual for cough to linger -but want to make sure symptoms are not getting worse

## 2016-09-28 NOTE — Telephone Encounter (Signed)
Linda notified Rx sent to pharmacy and advise of Dr. Marliss Coots instructions and recommendations and [name] verbalized understanding

## 2016-09-28 NOTE — Telephone Encounter (Signed)
Pts daughter [name], left vm concerning mother. Her UTI has improved with the abx, but reports pt still has non-productive cough. Today, is the last day for the antibiotic and she is wanting to know if pt should continue delsym and mucinex. pls advise

## 2016-09-29 DIAGNOSIS — M109 Gout, unspecified: Secondary | ICD-10-CM | POA: Diagnosis not present

## 2016-09-29 DIAGNOSIS — R1311 Dysphagia, oral phase: Secondary | ICD-10-CM | POA: Diagnosis not present

## 2016-09-29 DIAGNOSIS — I69354 Hemiplegia and hemiparesis following cerebral infarction affecting left non-dominant side: Secondary | ICD-10-CM | POA: Diagnosis not present

## 2016-09-29 DIAGNOSIS — I10 Essential (primary) hypertension: Secondary | ICD-10-CM | POA: Diagnosis not present

## 2016-09-29 DIAGNOSIS — R41841 Cognitive communication deficit: Secondary | ICD-10-CM | POA: Diagnosis not present

## 2016-09-29 DIAGNOSIS — E119 Type 2 diabetes mellitus without complications: Secondary | ICD-10-CM | POA: Diagnosis not present

## 2016-09-30 DIAGNOSIS — R1311 Dysphagia, oral phase: Secondary | ICD-10-CM | POA: Diagnosis not present

## 2016-09-30 DIAGNOSIS — E119 Type 2 diabetes mellitus without complications: Secondary | ICD-10-CM | POA: Diagnosis not present

## 2016-09-30 DIAGNOSIS — R41841 Cognitive communication deficit: Secondary | ICD-10-CM | POA: Diagnosis not present

## 2016-09-30 DIAGNOSIS — I10 Essential (primary) hypertension: Secondary | ICD-10-CM | POA: Diagnosis not present

## 2016-09-30 DIAGNOSIS — I69354 Hemiplegia and hemiparesis following cerebral infarction affecting left non-dominant side: Secondary | ICD-10-CM | POA: Diagnosis not present

## 2016-09-30 DIAGNOSIS — M109 Gout, unspecified: Secondary | ICD-10-CM | POA: Diagnosis not present

## 2016-10-01 DIAGNOSIS — R41841 Cognitive communication deficit: Secondary | ICD-10-CM | POA: Diagnosis not present

## 2016-10-01 DIAGNOSIS — L820 Inflamed seborrheic keratosis: Secondary | ICD-10-CM | POA: Diagnosis not present

## 2016-10-01 DIAGNOSIS — Z85820 Personal history of malignant melanoma of skin: Secondary | ICD-10-CM | POA: Diagnosis not present

## 2016-10-01 DIAGNOSIS — D1801 Hemangioma of skin and subcutaneous tissue: Secondary | ICD-10-CM | POA: Diagnosis not present

## 2016-10-01 DIAGNOSIS — L821 Other seborrheic keratosis: Secondary | ICD-10-CM | POA: Diagnosis not present

## 2016-10-01 DIAGNOSIS — D2262 Melanocytic nevi of left upper limb, including shoulder: Secondary | ICD-10-CM | POA: Diagnosis not present

## 2016-10-01 DIAGNOSIS — L814 Other melanin hyperpigmentation: Secondary | ICD-10-CM | POA: Diagnosis not present

## 2016-10-01 DIAGNOSIS — M109 Gout, unspecified: Secondary | ICD-10-CM | POA: Diagnosis not present

## 2016-10-01 DIAGNOSIS — D485 Neoplasm of uncertain behavior of skin: Secondary | ICD-10-CM | POA: Diagnosis not present

## 2016-10-01 DIAGNOSIS — R1311 Dysphagia, oral phase: Secondary | ICD-10-CM | POA: Diagnosis not present

## 2016-10-01 DIAGNOSIS — D225 Melanocytic nevi of trunk: Secondary | ICD-10-CM | POA: Diagnosis not present

## 2016-10-01 DIAGNOSIS — Z85828 Personal history of other malignant neoplasm of skin: Secondary | ICD-10-CM | POA: Diagnosis not present

## 2016-10-01 DIAGNOSIS — I10 Essential (primary) hypertension: Secondary | ICD-10-CM | POA: Diagnosis not present

## 2016-10-01 DIAGNOSIS — I69354 Hemiplegia and hemiparesis following cerebral infarction affecting left non-dominant side: Secondary | ICD-10-CM | POA: Diagnosis not present

## 2016-10-01 DIAGNOSIS — E119 Type 2 diabetes mellitus without complications: Secondary | ICD-10-CM | POA: Diagnosis not present

## 2016-10-02 DIAGNOSIS — E119 Type 2 diabetes mellitus without complications: Secondary | ICD-10-CM | POA: Diagnosis not present

## 2016-10-02 DIAGNOSIS — M109 Gout, unspecified: Secondary | ICD-10-CM | POA: Diagnosis not present

## 2016-10-02 DIAGNOSIS — R1311 Dysphagia, oral phase: Secondary | ICD-10-CM | POA: Diagnosis not present

## 2016-10-02 DIAGNOSIS — I10 Essential (primary) hypertension: Secondary | ICD-10-CM | POA: Diagnosis not present

## 2016-10-02 DIAGNOSIS — I69354 Hemiplegia and hemiparesis following cerebral infarction affecting left non-dominant side: Secondary | ICD-10-CM | POA: Diagnosis not present

## 2016-10-02 DIAGNOSIS — R41841 Cognitive communication deficit: Secondary | ICD-10-CM | POA: Diagnosis not present

## 2016-10-05 DIAGNOSIS — R1311 Dysphagia, oral phase: Secondary | ICD-10-CM | POA: Diagnosis not present

## 2016-10-05 DIAGNOSIS — R41841 Cognitive communication deficit: Secondary | ICD-10-CM | POA: Diagnosis not present

## 2016-10-05 DIAGNOSIS — I69354 Hemiplegia and hemiparesis following cerebral infarction affecting left non-dominant side: Secondary | ICD-10-CM | POA: Diagnosis not present

## 2016-10-05 DIAGNOSIS — I10 Essential (primary) hypertension: Secondary | ICD-10-CM | POA: Diagnosis not present

## 2016-10-05 DIAGNOSIS — E119 Type 2 diabetes mellitus without complications: Secondary | ICD-10-CM | POA: Diagnosis not present

## 2016-10-05 DIAGNOSIS — M109 Gout, unspecified: Secondary | ICD-10-CM | POA: Diagnosis not present

## 2016-10-06 ENCOUNTER — Telehealth: Payer: Self-pay

## 2016-10-06 DIAGNOSIS — E119 Type 2 diabetes mellitus without complications: Secondary | ICD-10-CM | POA: Diagnosis not present

## 2016-10-06 DIAGNOSIS — M109 Gout, unspecified: Secondary | ICD-10-CM | POA: Diagnosis not present

## 2016-10-06 DIAGNOSIS — I10 Essential (primary) hypertension: Secondary | ICD-10-CM | POA: Diagnosis not present

## 2016-10-06 DIAGNOSIS — R1311 Dysphagia, oral phase: Secondary | ICD-10-CM | POA: Diagnosis not present

## 2016-10-06 DIAGNOSIS — I69354 Hemiplegia and hemiparesis following cerebral infarction affecting left non-dominant side: Secondary | ICD-10-CM | POA: Diagnosis not present

## 2016-10-06 DIAGNOSIS — R41841 Cognitive communication deficit: Secondary | ICD-10-CM | POA: Diagnosis not present

## 2016-10-06 NOTE — Telephone Encounter (Signed)
I'm ok with that- if verbal orders are needed please ok them  Please put FL2 on my desk so I can work on it   (if she has a copy of the last one that would be helpful)- thanks  Perhaps you can get it from [facility]?

## 2016-10-06 NOTE — Telephone Encounter (Signed)
Linda left v/m; pt has agreed to go back to [facility] from more therapy but pts 30 days are up on 10/09/16; need FL2 to have pt readmitted to [facility] facility on 10/08/16; request on order for pt to have PT, OT and speech therapy while at [facility]; for insurance to pay pt has to be readmitted within 30 days which [name] believes would be 10/09/16. Linda request cb.

## 2016-10-07 ENCOUNTER — Other Ambulatory Visit: Payer: Self-pay | Admitting: Family Medicine

## 2016-10-07 DIAGNOSIS — Z7689 Persons encountering health services in other specified circumstances: Secondary | ICD-10-CM

## 2016-10-07 NOTE — Telephone Encounter (Signed)
[name] called and said she'd like to have the fl-2 faxed to [facility] and [name] would like to pick up form.  Patient was called by [place] this afternoon and patient couldn't articulate her words because she was so tired.  [name] said she needs to go back to [facility]. Insurance won't pay unless she's back at [facility] by tomorrow or Friday.

## 2016-10-07 NOTE — Telephone Encounter (Signed)
See prev message, form's in your inbox

## 2016-10-07 NOTE — Telephone Encounter (Signed)
Lauren [name] (front desk) advise [name] to get FL2 form from nursing home, she will go get it and drop it off here

## 2016-10-07 NOTE — Telephone Encounter (Signed)
Lauren [name] is aware as instructed and would like [place] to fax the form then place in front office for pick up in the AM

## 2016-10-07 NOTE — Telephone Encounter (Signed)
Let her know I am working evening clinic and will get to the [facility] once I am done with patients today

## 2016-10-07 NOTE — Telephone Encounter (Signed)
Form is in IN box

## 2016-10-07 NOTE — Telephone Encounter (Signed)
[name] had home fax form in. Placed in [employer] tower.

## 2016-10-08 DIAGNOSIS — E119 Type 2 diabetes mellitus without complications: Secondary | ICD-10-CM | POA: Diagnosis not present

## 2016-10-08 DIAGNOSIS — R1311 Dysphagia, oral phase: Secondary | ICD-10-CM | POA: Diagnosis not present

## 2016-10-08 DIAGNOSIS — I6350 Cerebral infarction due to unspecified occlusion or stenosis of unspecified cerebral artery: Secondary | ICD-10-CM | POA: Diagnosis not present

## 2016-10-08 DIAGNOSIS — M6281 Muscle weakness (generalized): Secondary | ICD-10-CM | POA: Diagnosis not present

## 2016-10-08 DIAGNOSIS — I639 Cerebral infarction, unspecified: Secondary | ICD-10-CM | POA: Diagnosis not present

## 2016-10-08 DIAGNOSIS — R2681 Unsteadiness on feet: Secondary | ICD-10-CM | POA: Diagnosis not present

## 2016-10-08 DIAGNOSIS — J189 Pneumonia, unspecified organism: Secondary | ICD-10-CM | POA: Diagnosis not present

## 2016-10-08 DIAGNOSIS — I10 Essential (primary) hypertension: Secondary | ICD-10-CM | POA: Diagnosis not present

## 2016-10-08 DIAGNOSIS — K219 Gastro-esophageal reflux disease without esophagitis: Secondary | ICD-10-CM | POA: Diagnosis not present

## 2016-10-08 DIAGNOSIS — I63521 Cerebral infarction due to unspecified occlusion or stenosis of right anterior cerebral artery: Secondary | ICD-10-CM | POA: Diagnosis not present

## 2016-10-08 DIAGNOSIS — N390 Urinary tract infection, site not specified: Secondary | ICD-10-CM | POA: Diagnosis not present

## 2016-10-08 DIAGNOSIS — R41841 Cognitive communication deficit: Secondary | ICD-10-CM | POA: Diagnosis not present

## 2016-10-08 DIAGNOSIS — J180 Bronchopneumonia, unspecified organism: Secondary | ICD-10-CM | POA: Diagnosis not present

## 2016-10-08 DIAGNOSIS — I69359 Hemiplegia and hemiparesis following cerebral infarction affecting unspecified side: Secondary | ICD-10-CM | POA: Diagnosis not present

## 2016-10-08 DIAGNOSIS — R278 Other lack of coordination: Secondary | ICD-10-CM | POA: Diagnosis not present

## 2016-10-08 NOTE — Telephone Encounter (Signed)
Form fax and [name] notified and advise form ready for pick up at the front desk

## 2016-10-14 ENCOUNTER — Ambulatory Visit: Payer: Medicare Other | Admitting: Family Medicine

## 2016-10-29 DIAGNOSIS — R41841 Cognitive communication deficit: Secondary | ICD-10-CM | POA: Diagnosis not present

## 2016-10-29 DIAGNOSIS — I69354 Hemiplegia and hemiparesis following cerebral infarction affecting left non-dominant side: Secondary | ICD-10-CM | POA: Diagnosis not present

## 2016-10-29 DIAGNOSIS — M109 Gout, unspecified: Secondary | ICD-10-CM | POA: Diagnosis not present

## 2016-10-29 DIAGNOSIS — R1311 Dysphagia, oral phase: Secondary | ICD-10-CM | POA: Diagnosis not present

## 2016-10-29 DIAGNOSIS — I10 Essential (primary) hypertension: Secondary | ICD-10-CM | POA: Diagnosis not present

## 2016-10-29 DIAGNOSIS — E119 Type 2 diabetes mellitus without complications: Secondary | ICD-10-CM | POA: Diagnosis not present

## 2016-11-02 ENCOUNTER — Encounter: Payer: Self-pay | Admitting: Family Medicine

## 2016-11-02 DIAGNOSIS — I69354 Hemiplegia and hemiparesis following cerebral infarction affecting left non-dominant side: Secondary | ICD-10-CM | POA: Diagnosis not present

## 2016-11-02 DIAGNOSIS — R41841 Cognitive communication deficit: Secondary | ICD-10-CM | POA: Diagnosis not present

## 2016-11-02 DIAGNOSIS — I10 Essential (primary) hypertension: Secondary | ICD-10-CM | POA: Diagnosis not present

## 2016-11-02 DIAGNOSIS — C44629 Squamous cell carcinoma of skin of left upper limb, including shoulder: Secondary | ICD-10-CM | POA: Diagnosis not present

## 2016-11-02 DIAGNOSIS — R1311 Dysphagia, oral phase: Secondary | ICD-10-CM | POA: Diagnosis not present

## 2016-11-02 DIAGNOSIS — M109 Gout, unspecified: Secondary | ICD-10-CM | POA: Diagnosis not present

## 2016-11-02 DIAGNOSIS — L814 Other melanin hyperpigmentation: Secondary | ICD-10-CM | POA: Diagnosis not present

## 2016-11-02 DIAGNOSIS — D0461 Carcinoma in situ of skin of right upper limb, including shoulder: Secondary | ICD-10-CM | POA: Diagnosis not present

## 2016-11-02 DIAGNOSIS — Z85828 Personal history of other malignant neoplasm of skin: Secondary | ICD-10-CM | POA: Diagnosis not present

## 2016-11-02 DIAGNOSIS — E119 Type 2 diabetes mellitus without complications: Secondary | ICD-10-CM | POA: Diagnosis not present

## 2016-11-02 HISTORY — PX: ARM SKIN LESION BIOPSY / EXCISION: SUR471

## 2016-11-03 ENCOUNTER — Ambulatory Visit (INDEPENDENT_AMBULATORY_CARE_PROVIDER_SITE_OTHER): Payer: Medicare Other

## 2016-11-03 VITALS — BP 128/82 | HR 95 | Temp 97.5°F | Ht 63.0 in | Wt 122.8 lb

## 2016-11-03 DIAGNOSIS — Z0000 Encounter for general adult medical examination without abnormal findings: Secondary | ICD-10-CM | POA: Diagnosis not present

## 2016-11-03 DIAGNOSIS — E119 Type 2 diabetes mellitus without complications: Secondary | ICD-10-CM

## 2016-11-03 LAB — MICROALBUMIN / CREATININE URINE RATIO
CREATININE, U: 107.4 mg/dL
MICROALB UR: 0.9 mg/dL (ref 0.0–1.9)
Microalb Creat Ratio: 0.8 mg/g (ref 0.0–30.0)

## 2016-11-03 NOTE — Progress Notes (Signed)
Pre visit review using our clinic review tool, if applicable. No additional management support is needed unless otherwise documented below in the visit note. 

## 2016-11-03 NOTE — Progress Notes (Signed)
PCP notes:   Health maintenance:  Tetanus - postponed/insurance  Abnormal screenings:   Hearing - failed Fall risk - hx of fall with injury and medical treatment  Patient concerns:   None  Nurse concerns:  None  Next PCP appt:   11/10/16 @ [id number]  I reviewed health advisor's note, was available for consultation, and agree with documentation and plan. [name] MD

## 2016-11-03 NOTE — Progress Notes (Signed)
Subjective:   Lauren [name] is a 83 y.o. female who presents for Medicare Annual (Subsequent) preventive examination.  Review of Systems:  N/A Cardiac Risk Factors include: advanced age (>5men, >41 women);diabetes mellitus;dyslipidemia     Objective:     Vitals: BP 128/82 (BP Location: Left Arm, Patient Position: Sitting, Cuff Size: Normal)   Pulse 95   Temp 97.5 F (36.4 C) (Oral)   Ht 5\' 3"  (1.6 m) Comment: no shoes  Wt 122 lb 12 oz (55.7 kg)   SpO2 96%   BMI 21.74 kg/m   Body mass index is 21.74 kg/m.   Tobacco History  Smoking Status  . Never Smoker  Smokeless Tobacco  . Never Used     Counseling given: No   Past Medical History:  Diagnosis Date  . Breast cancer ([place])    left breast  . Chronic constipation   . Chronic headaches   . Diabetes mellitus   . Fibrocystic breast   . Gastritis   . Gout    injection in the past  . Hyperlipidemia   . Melanoma in situ of left upper extremity ([place])   . Pelvic fracture ([place])   . TIA (transient ischemic attack) 08/2008   neg MRI/MRA and CT and carotid dopplers  . Venous insufficiency    Past Surgical History:  Procedure Laterality Date  . APPENDECTOMY    . ARM SKIN LESION BIOPSY / EXCISION Right 11/02/2016  . BREAST CYST ASPIRATION    . BREAST LUMPECTOMY  09/2007   with sentinel LN biopsy  . BREAST LUMPECTOMY     left  . EYE SURGERY     retinal, then cataract   Family History  Problem Relation Age of Onset  . Breast cancer Sister   . Stroke Sister   . Cancer Sister        breast  . Breast cancer Sister   . Stroke Sister   . Cancer Sister        breast  . Stroke Mother   . Stroke Father   . Stroke Brother   . Cancer Brother        prostate  . Stroke Brother   . Stroke Sister   . CAD Sister    History  Sexual Activity  . Sexual activity: Not Currently    Outpatient Encounter Prescriptions as of 11/03/2016  Medication Sig  . acetaminophen (TYLENOL) 325 MG tablet Take 2 tablets (650 mg  total) by mouth every 6 (six) hours as needed for mild pain (or Fever >/= 101).  . calcium carbonate (TUMS - DOSED IN MG ELEMENTAL CALCIUM) 500 MG chewable tablet Chew 1 tablet by mouth as needed for heartburn.   . citalopram (CELEXA) 10 MG tablet Take 0.5 tablets (5 mg total) by mouth daily.  . clopidogrel (PLAVIX) 75 MG tablet TAKE ONE TABLET BY MOUTH EVERY MORNING WITH BREAKFAST.  Marland Kitchen glucose blood (ONE TOUCH ULTRA TEST) test strip USE AS DIRECTED TO CHECK BLOOD SUGAR ONCE A DAY AND AS NEEDED (DX. E11.9)  . hydrOXYzine (ATARAX/VISTARIL) 10 MG tablet Take 1 tablet (10 mg total) by mouth at bedtime as needed for itching.  . metFORMIN (GLUCOPHAGE) 500 MG tablet Take 0.5 tablets (250 mg total) by mouth 2 (two) times daily with a meal.  . Multiple Vitamins-Minerals (PRESERVISION AREDS 2 PO) Take 1 tablet by mouth 2 (two) times daily.  . pantoprazole (PROTONIX) 40 MG tablet Take 1 tablet (40 mg total) by mouth daily.  . polyethylene glycol  powder (GLYCOLAX/MIRALAX) powder DISSOLVE ONE (1) CAPFUL (17GM) INTO 4 TO8 OUNCES OF FLUID AND TAKE BYMOUTH ONCE DAILY AS DIRECTED  . ranitidine (ZANTAC) 300 MG tablet Take 1 tablet (300 mg total) by mouth daily as needed for heartburn.  . simethicone (MYLICON) 80 MG chewable tablet Chew 80 mg by mouth every 6 (six) hours as needed for flatulence.  . simvastatin (ZOCOR) 20 MG tablet TAKE ONE TABLET BY MOUTH EVERY NIGHT AT BEDTIME  . [DISCONTINUED] citalopram (CELEXA) 10 MG tablet TAKE 1 TABLET BY MOUTH DAILY  . benzonatate (TESSALON) 200 MG capsule Take 1 capsule (200 mg total) by mouth 3 (three) times daily as needed. Swallow whole-do not bite pill (Patient not taking: Reported on 11/03/2016)  . dextromethorphan (DELSYM) 30 MG/5ML liquid Take 30 mg by mouth as needed for cough.  . fish oil-omega-3 fatty acids 1000 MG capsule Take 1 g by mouth daily.    Marland Kitchen guaiFENesin (MUCINEX) 600 MG 12 hr tablet Take by mouth 2 (two) times daily.   No facility-administered encounter  medications on file as of 11/03/2016.     Activities of Daily Living In your present state of health, do you have any difficulty performing the following activities: 11/03/2016 08/16/2016  Hearing? Y N  Vision? N N  Difficulty concentrating or making decisions? N N  Walking or climbing stairs? Y N  Dressing or bathing? N Y  Doing errands, shopping? Tempie Donning  Preparing Food and eating ? N -  Using the Toilet? N -  In the past six months, have you accidently leaked urine? Y -  Do you have problems with loss of bowel control? N -  Managing your Medications? N -  Managing your Finances? Y -  Housekeeping or managing your Housekeeping? Y -  Some recent data might be hidden    Patient Care Team: Tower, Wynelle Fanny, MD as PCP - General (Family Medicine) Hayden Pedro, MD as Consulting Physician (Ophthalmology) Marygrace Drought, MD as Consulting Physician (Ophthalmology) Rolm Bookbinder, MD as Consulting Physician (Dermatology) Carloyn Manner, MD as Referring Physician (Otolaryngology) Nicholas Lose, MD as Consulting Physician (Hematology and Oncology) Wallene Huh, DPM as Consulting Physician (Podiatry)    Assessment:     Hearing Screening   125Hz  250Hz  500Hz  1000Hz  2000Hz  3000Hz  4000Hz  6000Hz  8000Hz   Right ear:   0 0 40  0    Left ear:   40 40 40  40    Vision Screening Comments: Last vision exam in Jan 2018    Exercise Activities and Dietary recommendations Current Exercise Habits: The patient does not participate in regular exercise at present (pt is currently doing PT exercises with therapist twice weekly), Exercise limited by: orthopedic condition(s)  Goals    . Increase physical activity          When scheduled, I will participate in therapy as scheduled as part of stroke recovery program.       Fall Risk Fall Risk  11/03/2016 09/23/2016 03/19/2016 08/02/2015 05/01/2014  Falls in the past year? Yes Yes No No No  Number falls in past yr: 1 1 - - -  Injury with Fall? Yes No - -  -  Risk for fall due to : - - - - -  Follow up - Falls prevention discussed - - -   Depression Screen PHQ 2/9 Scores 11/03/2016 08/02/2015 05/01/2014 04/04/2013  PHQ - 2 Score 0 0 0 0     Cognitive Function MMSE - Mini Mental State Exam 11/03/2016  08/02/2015  Orientation to time 5 5  Orientation to Place 5 5  Registration 3 3  Attention/ Calculation 0 5  Recall 3 3  Language- name 2 objects 0 0  Language- repeat 1 1  Language- follow 3 step command 3 3  Language- read & follow direction 0 1  Write a sentence 0 0  Copy design 0 0  Total score 20 26     PLEASE NOTE: A Mini-Cog screen was completed. Maximum score is 20. A value of 0 denotes this part of [place] MMSE was not completed or the patient failed this part of the Mini-Cog screening.   Mini-Cog Screening Orientation to Time - Max 5 pts Orientation to Place - Max 5 pts Registration - Max 3 pts Recall - Max 3 pts Language Repeat - Max 1 pts Language Follow 3 Step Command - Max 3 pts     Immunization History  Administered Date(s) Administered  . Influenza Split 03/03/2011, 03/17/2012  . Influenza Whole 05/25/2005, 02/28/2009, 02/20/2010  . Influenza,inj,Quad PF,36+ Mos 02/16/2013, 02/07/2014, 02/04/2015, 02/05/2016  . Pneumococcal Conjugate-13 05/01/2014  . Pneumococcal Polysaccharide-23 10/18/2008  . Td 10/27/2004  . Zoster 01/13/2013   Screening Tests Health Maintenance  Topic Date Due  . TETANUS/TDAP  10/26/2024 (Originally 10/28/2014)  . INFLUENZA VACCINE  12/23/2016  . MAMMOGRAM  01/02/2017  . HEMOGLOBIN A1C  02/15/2017  . FOOT EXAM  05/20/2017  . OPHTHALMOLOGY EXAM  06/22/2017  . URINE MICROALBUMIN  11/03/2017  . DEXA SCAN  Completed  . PNA vac Low Risk Adult  Completed      Plan:     I have personally reviewed and addressed the Medicare Annual Wellness questionnaire and have noted the following in the patient's chart:  A. Medical and social history B. Use of alcohol, tobacco or illicit drugs   C. Current medications and supplements D. Functional ability and status E.  Nutritional status F.  Physical activity G. Advance directives H. List of other physicians I.  Hospitalizations, surgeries, and ER visits in previous 12 months J.  [place] to include hearing, vision, cognitive, depression L. Referrals and appointments - none  In addition, I have reviewed and discussed with patient certain preventive protocols, quality metrics, and best practice recommendations. A written personalized care plan for preventive services as well as general preventive health recommendations were provided to patient.  See attached scanned questionnaire for additional information.   Signed,   Lindell Noe, MHA, BS, LPN Health Coach

## 2016-11-03 NOTE — Patient Instructions (Addendum)
Lauren [name] , Thank you for taking time to come for your Medicare Wellness Visit. I appreciate your ongoing commitment to your health goals. Please review the following plan we discussed and let me know if I can assist you in the future.   These are the goals we discussed: Goals    . Increase physical activity          When scheduled, I will participate in therapy as scheduled as part of stroke recovery program.        This is a list of the screening recommended for you and due dates:  Health Maintenance  Topic Date Due  . Tetanus Vaccine  10/26/2024*  . Flu Shot  12/23/2016  . Mammogram  01/02/2017  . Hemoglobin A1C  02/15/2017  . Complete foot exam   05/20/2017  . Eye exam for diabetics  06/22/2017  . Urine Protein Check  11/03/2017  . DEXA scan (bone density measurement)  Completed  . Pneumonia vaccines  Completed  *Topic was postponed. The date shown is not the original due date.      Preventive Care for Adults  A healthy lifestyle and preventive care can promote health and wellness. Preventive health guidelines for adults include the following key practices.  . A routine yearly physical is a good way to check with your health care provider about your health and preventive screening. It is a chance to share any concerns and updates on your health and to receive a thorough exam.  . Visit your dentist for a routine exam and preventive care every 6 months. Brush your teeth twice a day and floss once a day. Good oral hygiene prevents tooth decay and gum disease.  . The frequency of eye exams is based on your age, health, family medical history, use  of contact lenses, and other factors. Follow your health care provider's ecommendations for frequency of eye exams.  . Eat a healthy diet. Foods like vegetables, fruits, whole grains, low-fat dairy products, and lean protein foods contain the nutrients you need without too many calories. Decrease your intake of foods high in solid  fats, added sugars, and salt. Eat the right amount of calories for you. Get information about a proper diet from your health care provider, if necessary.  . Regular physical exercise is one of the most important things you can do for your health. Most adults should get at least 150 minutes of moderate-intensity exercise (any activity that increases your heart rate and causes you to sweat) each week. In addition, most adults need muscle-strengthening exercises on 2 or more days a week.  Silver Sneakers may be a benefit available to you. To determine eligibility, you may visit the website: www.silversneakers.com or contact program at [phone] Mon-Fri between 8AM-8PM.   . Maintain a healthy weight. The body mass index (BMI) is a screening tool to identify possible weight problems. It provides an estimate of body fat based on height and weight. Your health care provider can find your BMI and can help you achieve or maintain a healthy weight.   For adults 20 years and older: ? A BMI below 18.5 is considered underweight. ? A BMI of 18.5 to 24.9 is normal. ? A BMI of 25 to 29.9 is considered overweight. ? A BMI of 30 and above is considered obese.   . Maintain normal blood lipids and cholesterol levels by exercising and minimizing your intake of saturated fat. Eat a balanced diet with plenty of fruit and vegetables. Blood  tests for lipids and cholesterol should begin at age 56 and be repeated every 5 years. If your lipid or cholesterol levels are high, you are over 50, or you are at high risk for heart disease, you may need your cholesterol levels checked more frequently. Ongoing high lipid and cholesterol levels should be treated with medicines if diet and exercise are not working.  . If you smoke, find out from your health care provider how to quit. If you do not use tobacco, please do not start.  . If you choose to drink alcohol, please do not consume more than 2 drinks per day. One drink is  considered to be 12 ounces (355 mL) of beer, 5 ounces (148 mL) of wine, or 1.5 ounces (44 mL) of liquor.  . If you are 17-31 years old, ask your health care provider if you should take aspirin to prevent strokes.  . Use sunscreen. Apply sunscreen liberally and repeatedly throughout the day. You should seek shade when your shadow is shorter than you. Protect yourself by wearing long sleeves, pants, a wide-brimmed hat, and sunglasses year round, whenever you are outdoors.  . Once a month, do a whole body skin exam, using a mirror to look at the skin on your back. Tell your health care provider of new moles, moles that have irregular borders, moles that are larger than a pencil eraser, or moles that have changed in shape or color.

## 2016-11-04 ENCOUNTER — Telehealth: Payer: Self-pay | Admitting: *Deleted

## 2016-11-04 DIAGNOSIS — E119 Type 2 diabetes mellitus without complications: Secondary | ICD-10-CM | POA: Diagnosis not present

## 2016-11-04 DIAGNOSIS — M109 Gout, unspecified: Secondary | ICD-10-CM | POA: Diagnosis not present

## 2016-11-04 DIAGNOSIS — I69354 Hemiplegia and hemiparesis following cerebral infarction affecting left non-dominant side: Secondary | ICD-10-CM | POA: Diagnosis not present

## 2016-11-04 DIAGNOSIS — R1311 Dysphagia, oral phase: Secondary | ICD-10-CM | POA: Diagnosis not present

## 2016-11-04 DIAGNOSIS — I10 Essential (primary) hypertension: Secondary | ICD-10-CM | POA: Diagnosis not present

## 2016-11-04 DIAGNOSIS — R41841 Cognitive communication deficit: Secondary | ICD-10-CM | POA: Diagnosis not present

## 2016-11-04 NOTE — Telephone Encounter (Signed)
Please ok that verbal order  

## 2016-11-04 NOTE — Telephone Encounter (Signed)
Izora Gala, OT with [facility] left a vm at triage requesting an order for Eval and Tx for speech. Order can be left with someone in office at telephone number provided.

## 2016-11-05 DIAGNOSIS — I69354 Hemiplegia and hemiparesis following cerebral infarction affecting left non-dominant side: Secondary | ICD-10-CM | POA: Diagnosis not present

## 2016-11-05 DIAGNOSIS — R41841 Cognitive communication deficit: Secondary | ICD-10-CM | POA: Diagnosis not present

## 2016-11-05 DIAGNOSIS — R1311 Dysphagia, oral phase: Secondary | ICD-10-CM | POA: Diagnosis not present

## 2016-11-05 DIAGNOSIS — I10 Essential (primary) hypertension: Secondary | ICD-10-CM | POA: Diagnosis not present

## 2016-11-05 DIAGNOSIS — E119 Type 2 diabetes mellitus without complications: Secondary | ICD-10-CM | POA: Diagnosis not present

## 2016-11-05 DIAGNOSIS — M109 Gout, unspecified: Secondary | ICD-10-CM | POA: Diagnosis not present

## 2016-11-05 NOTE — Telephone Encounter (Signed)
Verbal order given to [facility]

## 2016-11-06 ENCOUNTER — Telehealth: Payer: Self-pay

## 2016-11-06 DIAGNOSIS — I69354 Hemiplegia and hemiparesis following cerebral infarction affecting left non-dominant side: Secondary | ICD-10-CM | POA: Diagnosis not present

## 2016-11-06 DIAGNOSIS — R1311 Dysphagia, oral phase: Secondary | ICD-10-CM | POA: Diagnosis not present

## 2016-11-06 DIAGNOSIS — R41841 Cognitive communication deficit: Secondary | ICD-10-CM | POA: Diagnosis not present

## 2016-11-06 DIAGNOSIS — I10 Essential (primary) hypertension: Secondary | ICD-10-CM | POA: Diagnosis not present

## 2016-11-06 DIAGNOSIS — M109 Gout, unspecified: Secondary | ICD-10-CM | POA: Diagnosis not present

## 2016-11-06 DIAGNOSIS — E119 Type 2 diabetes mellitus without complications: Secondary | ICD-10-CM | POA: Diagnosis not present

## 2016-11-06 NOTE — Telephone Encounter (Signed)
Amy speech therapist with [facility] left v/m; Amy will be on vacation next week and pt request to skip speech therapy next wk for 2 visits. Speech therapy will resume the week of November 16, 2016. This is FYI and no cb needed.

## 2016-11-08 NOTE — Telephone Encounter (Signed)
Aware. 

## 2016-11-10 ENCOUNTER — Ambulatory Visit (INDEPENDENT_AMBULATORY_CARE_PROVIDER_SITE_OTHER): Payer: Medicare Other | Admitting: Family Medicine

## 2016-11-10 ENCOUNTER — Encounter: Payer: Self-pay | Admitting: Family Medicine

## 2016-11-10 VITALS — BP 122/60 | HR 95 | Temp 98.4°F | Ht 62.5 in | Wt 123.5 lb

## 2016-11-10 DIAGNOSIS — Z7984 Long term (current) use of oral hypoglycemic drugs: Secondary | ICD-10-CM | POA: Diagnosis not present

## 2016-11-10 DIAGNOSIS — C50912 Malignant neoplasm of unspecified site of left female breast: Secondary | ICD-10-CM

## 2016-11-10 DIAGNOSIS — E1159 Type 2 diabetes mellitus with other circulatory complications: Secondary | ICD-10-CM | POA: Diagnosis not present

## 2016-11-10 DIAGNOSIS — E119 Type 2 diabetes mellitus without complications: Secondary | ICD-10-CM | POA: Diagnosis not present

## 2016-11-10 DIAGNOSIS — R1311 Dysphagia, oral phase: Secondary | ICD-10-CM | POA: Diagnosis not present

## 2016-11-10 DIAGNOSIS — Z9889 Other specified postprocedural states: Secondary | ICD-10-CM | POA: Diagnosis not present

## 2016-11-10 DIAGNOSIS — I10 Essential (primary) hypertension: Secondary | ICD-10-CM

## 2016-11-10 DIAGNOSIS — M109 Gout, unspecified: Secondary | ICD-10-CM | POA: Diagnosis not present

## 2016-11-10 DIAGNOSIS — H9193 Unspecified hearing loss, bilateral: Secondary | ICD-10-CM | POA: Diagnosis not present

## 2016-11-10 DIAGNOSIS — F419 Anxiety disorder, unspecified: Secondary | ICD-10-CM | POA: Diagnosis not present

## 2016-11-10 DIAGNOSIS — R41841 Cognitive communication deficit: Secondary | ICD-10-CM | POA: Diagnosis not present

## 2016-11-10 DIAGNOSIS — F329 Major depressive disorder, single episode, unspecified: Secondary | ICD-10-CM | POA: Diagnosis not present

## 2016-11-10 DIAGNOSIS — I639 Cerebral infarction, unspecified: Secondary | ICD-10-CM

## 2016-11-10 DIAGNOSIS — Z85820 Personal history of malignant melanoma of skin: Secondary | ICD-10-CM

## 2016-11-10 DIAGNOSIS — E7800 Pure hypercholesterolemia, unspecified: Secondary | ICD-10-CM

## 2016-11-10 DIAGNOSIS — Z7902 Long term (current) use of antithrombotics/antiplatelets: Secondary | ICD-10-CM | POA: Diagnosis not present

## 2016-11-10 DIAGNOSIS — Z853 Personal history of malignant neoplasm of breast: Secondary | ICD-10-CM | POA: Diagnosis not present

## 2016-11-10 DIAGNOSIS — M8580 Other specified disorders of bone density and structure, unspecified site: Secondary | ICD-10-CM

## 2016-11-10 DIAGNOSIS — I69354 Hemiplegia and hemiparesis following cerebral infarction affecting left non-dominant side: Secondary | ICD-10-CM

## 2016-11-10 DIAGNOSIS — E785 Hyperlipidemia, unspecified: Secondary | ICD-10-CM | POA: Diagnosis not present

## 2016-11-10 MED ORDER — CITALOPRAM HYDROBROMIDE 10 MG PO TABS: 5.0000 mg | ORAL_TABLET | Freq: Every day | ORAL | 11 refills | Status: DC

## 2016-11-10 MED ORDER — CLOPIDOGREL BISULFATE 75 MG PO TABS: ORAL_TABLET | ORAL | 11 refills | Status: DC

## 2016-11-10 MED ORDER — RANITIDINE HCL 300 MG PO TABS: 300.0000 mg | ORAL_TABLET | Freq: Every day | ORAL | 11 refills | Status: DC | PRN

## 2016-11-10 MED ORDER — SIMVASTATIN 20 MG PO TABS: 20.0000 mg | ORAL_TABLET | Freq: Every day | ORAL | 11 refills | Status: DC

## 2016-11-10 MED ORDER — METFORMIN HCL 500 MG PO TABS: 250.0000 mg | ORAL_TABLET | Freq: Two times a day (BID) | ORAL | 11 refills | Status: DC

## 2016-11-10 NOTE — Progress Notes (Signed)
Subjective:    Patient ID: Lauren [name], female    DOB: Oct 21, 1927, 83 y.o.   MRN: [id number]  HPI  Here for annual f/u of chronic medical problems   Home now and stronger from baseline -not quite 100% Had therapy this am and also speech   amw this mo Failed hearing R ear low freq- hearing does not bother her / may affect other people  Not interested in a hearing aide for now  Hx of fall ([place])-using walker   Wt Readings from Last 3 Encounters:  11/10/16 123 lb 8 oz (56 kg)  11/03/16 122 lb 12 oz (55.7 kg)  09/23/16 124 lb (56.2 kg)  stable Appetite is ok -eats very well  bmi 22.3   Tetanus 6/06  Mammogram 8/17-may or may not want to do another or f/u with oncology  Self breast exam- no symptoms or lumps  Hx of breast cancer  fam hx of breast cancer   Eye exam 1/18  microalb 6/18-in the normal range   dexa 1/16-stable osteopenia  Hx of pelvic fracture  Last fall was January - using walker most of the time  In PT as well  Has asst for bathing    bp is stable today  No cp or palpitations or headaches or edema  No side effects to medicines  BP Readings from Last 3 Encounters:  11/10/16 122/60  11/03/16 128/82  09/23/16 132/60     DM2 Lab Results  Component Value Date   HGBA1C 6.7 (H) 08/15/2016   Well controlled -no highs or lows   Cholesterol Lab Results  Component Value Date   CHOL 135 08/16/2016   CHOL 138 05/13/2016   CHOL 136 02/29/2016   Lab Results  Component Value Date   HDL 34 (L) 08/16/2016   HDL 40.70 05/13/2016   HDL 31 (L) 02/29/2016   Lab Results  Component Value Date   LDLCALC 71 08/16/2016   LDLCALC 67 05/13/2016   LDLCALC 55 02/29/2016   Lab Results  Component Value Date   TRIG 152 (H) 08/16/2016   TRIG 152.0 (H) 05/13/2016   TRIG 252 (H) 02/29/2016   Lab Results  Component Value Date   CHOLHDL 4.0 08/16/2016   CHOLHDL 3 05/13/2016   CHOLHDL 4.4 02/29/2016   Lab Results  Component Value Date   LDLDIRECT 76.2  04/24/2014   LDLDIRECT 46.3 10/01/2009   simvastatin and diet     Chemistry      Component Value Date/Time   NA 139 08/15/2016 1105   NA 141 11/20/2013 0958   K 4.3 08/15/2016 1105   K 4.5 11/20/2013 0958   CL 106 08/15/2016 1105   CO2 24 08/15/2016 1105   CO2 25 11/20/2013 0958   BUN 14 08/15/2016 1105   BUN 14.3 11/20/2013 0958   CREATININE 0.73 08/15/2016 1649   CREATININE 0.9 11/20/2013 0958      Component Value Date/Time   CALCIUM 9.8 08/15/2016 1105   CALCIUM 10.1 11/20/2013 0958   ALKPHOS 60 08/15/2016 1105   ALKPHOS 79 11/20/2013 0958   AST 22 08/15/2016 1105   AST 16 11/20/2013 0958   ALT 13 (L) 08/15/2016 1105   ALT 12 11/20/2013 0958   BILITOT 0.5 08/15/2016 1105   BILITOT 0.64 11/20/2013 0958     Lab Results  Component Value Date   WBC 11.5 (H) 08/15/2016   HGB 13.3 08/15/2016   HCT 40.4 08/15/2016   MCV 96.0 08/15/2016   PLT 219 08/15/2016  Lab Results  Component Value Date   TSH 3.930 08/15/2016    Patient Active Problem List   Diagnosis Date Noted  . Diastolic dysfunction 69/67/8938  . Acute CVA (cerebrovascular accident) ([place]) 08/15/2016  . HTN (hypertension) 08/15/2016  . GERD (gastroesophageal reflux disease) 08/06/2015  . Hearing loss 08/06/2015  . Estrogen deficiency 05/01/2014  . Left knee pain 09/22/2013  . Chest pain, musculoskeletal 08/05/2013  . Encounter for Medicare annual wellness exam 04/04/2013  . Colon cancer screening 04/04/2013  . History of melanoma excision 09/27/2012  . History of breast cancer, left 01/08/2011  . History of pelvic fracture 09/17/2010  . DEGENERATIVE JOINT DISEASE 02/13/2010  . Migraine variant 11/06/2009  . Osteopenia 10/18/2008  . Transient ischemic attack 09/10/2008  . Diabetes type 2, controlled ([place]) 10/06/2007  . VENOUS INSUFFICIENCY 10/06/2007  . Diverticulosis of colon 10/06/2007  . FIBROCYSTIC BREAST DISEASE 10/06/2007  . HYPERCHOLESTEROLEMIA, PURE 11/29/2006   Past Medical History:    Diagnosis Date  . Breast cancer ([place])    left breast  . Chronic constipation   . Chronic headaches   . Diabetes mellitus   . Fibrocystic breast   . Gastritis   . Gout    injection in the past  . Hyperlipidemia   . Melanoma in situ of left upper extremity ([place])   . Pelvic fracture ([place])   . TIA (transient ischemic attack) 08/2008   neg MRI/MRA and CT and carotid dopplers  . Venous insufficiency    Past Surgical History:  Procedure Laterality Date  . APPENDECTOMY    . ARM SKIN LESION BIOPSY / EXCISION Right 11/02/2016  . BREAST CYST ASPIRATION    . BREAST LUMPECTOMY  09/2007   with sentinel LN biopsy  . BREAST LUMPECTOMY     left  . EYE SURGERY     retinal, then cataract   Social History  Substance Use Topics  . Smoking status: Never Smoker  . Smokeless tobacco: Never Used  . Alcohol use No   Family History  Problem Relation Age of Onset  . Breast cancer Sister   . Stroke Sister   . Cancer Sister        breast  . Breast cancer Sister   . Stroke Sister   . Cancer Sister        breast  . Stroke Mother   . Stroke Father   . Stroke Brother   . Cancer Brother        prostate  . Stroke Brother   . Stroke Sister   . CAD Sister    Allergies  Allergen Reactions  . Sulfonamide Derivatives Itching  . Aspirin Other (See Comments)    Burning and pain in stomach   Current Outpatient Prescriptions on File Prior to Visit  Medication Sig Dispense Refill  . acetaminophen (TYLENOL) 325 MG tablet Take 2 tablets (650 mg total) by mouth every 6 (six) hours as needed for mild pain (or Fever >/= 101).    . calcium carbonate (TUMS - DOSED IN MG ELEMENTAL CALCIUM) 500 MG chewable tablet Chew 1 tablet by mouth as needed for heartburn.     Marland Kitchen glucose blood (ONE TOUCH ULTRA TEST) test strip USE AS DIRECTED TO CHECK BLOOD SUGAR ONCE A DAY AND AS NEEDED (DX. E11.9) 100 each 1  . hydrOXYzine (ATARAX/VISTARIL) 10 MG tablet Take 1 tablet (10 mg total) by mouth at bedtime as needed for  itching. 30 tablet 1  . Multiple Vitamins-Minerals (PRESERVISION AREDS 2 PO) Take 1  tablet by mouth 2 (two) times daily.    . pantoprazole (PROTONIX) 40 MG tablet Take 1 tablet (40 mg total) by mouth daily. 30 tablet 11  . polyethylene glycol powder (GLYCOLAX/MIRALAX) powder DISSOLVE ONE (1) CAPFUL (17GM) INTO 4 TO8 OUNCES OF FLUID AND TAKE BYMOUTH ONCE DAILY AS DIRECTED 527 g 0  . simethicone (MYLICON) 80 MG chewable tablet Chew 80 mg by mouth every 6 (six) hours as needed for flatulence.    . fish oil-omega-3 fatty acids 1000 MG capsule Take 1 g by mouth daily.       No current facility-administered medications on file prior to visit.     Review of Systems    Review of Systems  Constitutional: Negative for fever, appetite change, fatigue and unexpected weight change.  Eyes: Negative for pain and visual disturbance.  Respiratory: Negative for cough and shortness of breath.   Cardiovascular: Negative for cp or palpitations    Gastrointestinal: Negative for nausea, diarrhea and constipation.  Genitourinary: Negative for urgency and frequency.  Skin: Negative for pallor or rash   Neurological: Negative for, light-headedness, numbness and headaches. pos for slow speech which is improving and weakness (also improving with PT) from recent cva Hematological: Negative for adenopathy. Does not bruise/bleed easily.  Psychiatric/Behavioral: Negative for dysphoric mood. The patient is occ nervous/anxious.      Objective:   Physical Exam  Constitutional: She appears well-developed and well-nourished. No distress.  Frail appearing elderly female  HENT:  Head: Normocephalic and atraumatic.  Right Ear: External ear normal.  Left Ear: External ear normal.  Mouth/Throat: Oropharynx is clear and moist.  Eyes: Conjunctivae and EOM are normal. Pupils are equal, round, and reactive to light. No scleral icterus.  Neck: Normal range of motion. Neck supple. No JVD present. Carotid bruit is not present. No  thyromegaly present.  Cardiovascular: Normal rate, regular rhythm, normal heart sounds and intact distal pulses.  Exam reveals no gallop.   Pulmonary/Chest: Effort normal and breath sounds normal. No respiratory distress. She has no wheezes. She exhibits no tenderness.  Abdominal: Soft. Bowel sounds are normal. She exhibits no distension, no abdominal bruit and no mass. There is no tenderness.  Genitourinary: No breast swelling, tenderness, discharge or bleeding.  Genitourinary Comments: Breast exam: No mass, nodules, thickening, tenderness, bulging, retraction, inflamation, nipple discharge or skin changes noted.  No axillary or clavicular LA.    Surgical changes on the L   Musculoskeletal: Normal range of motion. She exhibits no edema or tenderness.  Lymphadenopathy:    She has no cervical adenopathy.  Neurological: She is alert. She has normal reflexes. No cranial nerve deficit. She exhibits normal muscle tone. Coordination normal.  L sided weakness from cva is improved Ambulates with walker  Needs asst to get on the table  Skin: Skin is warm and dry. No rash noted. No erythema. No pallor.  sks diffusely  Psychiatric: She has a normal mood and affect.  Pleasant           Assessment & Plan:   Problem List Items Addressed This Visit      Cardiovascular and Mediastinum   HTN (hypertension) - Primary    bp in fair control at this time  BP Readings from Last 1 Encounters:  11/10/16 122/60   No changes needed Disc lifstyle change with low sodium diet and exercise  Lab rev      Relevant Medications   simvastatin (ZOCOR) 20 MG tablet     Endocrine   Diabetes  type 2, controlled ([place])    Lab Results  Component Value Date   HGBA1C 6.7 (H) 08/15/2016   this was in march bs control at home is stable per pt  Disc foot and eye care  Takes metformin  microalb nl       Relevant Medications   metFORMIN (GLUCOPHAGE) 500 MG tablet   simvastatin (ZOCOR) 20 MG tablet      Nervous and Auditory   Hearing loss    Not bothering her  She does not want hearing aides at this time        Musculoskeletal and Integument   Osteopenia    Stable on dexa 1/16 Hx of pelvic fx No falls since jan with PT On ca and D  Uses walker  Not ready for another dexa at this time (recovering from a stroke)        Other   RESOLVED: Breast cancer, left breast ([place])   History of breast cancer, left    Pt is unsure given her age that she wants to continue mammograms or oncology f/u (due in aug) Will think about it  Urged to continue self exam  Nl exam today      History of melanoma excision    Pt keeps up with derm appt Bandage on R arm from recent mole removal that was normal      HYPERCHOLESTEROLEMIA, PURE    Disc goals for lipids and reasons to control them Rev labs with pt Rev low sat fat diet in detail  LDL is 71 Expect HDL to improve with more exercise/PT      Relevant Medications   simvastatin (ZOCOR) 20 MG tablet

## 2016-11-10 NOTE — Patient Instructions (Addendum)
Look into getting a tetanus shot in a pharmacy-it may be cheaper   Your mammogram is due in august along with oncology follow up You can decide whether to do that or not   Get back on your calcium and vitamin D   Always hold on to walker when able to prevent falls   Keep doing PT and exercising to help the HDL (good) cholesterol  Try to eat fish (with fins) when you can   I'm glad you are getting stronger and feeling better No change in medicines   Follow up in October with lab prior

## 2016-11-11 NOTE — Assessment & Plan Note (Signed)
Pt is unsure given her age that she wants to continue mammograms or oncology f/u (due in aug) Will think about it  Urged to continue self exam  Nl exam today

## 2016-11-11 NOTE — Assessment & Plan Note (Signed)
Pt keeps up with derm appt Bandage on R arm from recent mole removal that was normal

## 2016-11-11 NOTE — Assessment & Plan Note (Signed)
Disc goals for lipids and reasons to control them Rev labs with pt Rev low sat fat diet in detail  LDL is 71 Expect HDL to improve with more exercise/PT

## 2016-11-11 NOTE — Assessment & Plan Note (Signed)
Lab Results  Component Value Date   HGBA1C 6.7 (H) 08/15/2016   this was in march bs control at home is stable per pt  Disc foot and eye care  Takes metformin  microalb nl

## 2016-11-11 NOTE — Assessment & Plan Note (Signed)
bp in fair control at this time  BP Readings from Last 1 Encounters:  11/10/16 122/60   No changes needed Disc lifstyle change with low sodium diet and exercise  Lab rev

## 2016-11-11 NOTE — Assessment & Plan Note (Signed)
Stable on dexa 1/16 Hx of pelvic fx No falls since jan with PT On ca and D  Uses walker  Not ready for another dexa at this time (recovering from a stroke)

## 2016-11-11 NOTE — Assessment & Plan Note (Signed)
Not bothering her  She does not want hearing aides at this time

## 2016-11-12 DIAGNOSIS — R41841 Cognitive communication deficit: Secondary | ICD-10-CM | POA: Diagnosis not present

## 2016-11-12 DIAGNOSIS — I69354 Hemiplegia and hemiparesis following cerebral infarction affecting left non-dominant side: Secondary | ICD-10-CM | POA: Diagnosis not present

## 2016-11-12 DIAGNOSIS — E119 Type 2 diabetes mellitus without complications: Secondary | ICD-10-CM | POA: Diagnosis not present

## 2016-11-12 DIAGNOSIS — I10 Essential (primary) hypertension: Secondary | ICD-10-CM | POA: Diagnosis not present

## 2016-11-12 DIAGNOSIS — M109 Gout, unspecified: Secondary | ICD-10-CM | POA: Diagnosis not present

## 2016-11-12 DIAGNOSIS — R1311 Dysphagia, oral phase: Secondary | ICD-10-CM | POA: Diagnosis not present

## 2016-11-16 DIAGNOSIS — E119 Type 2 diabetes mellitus without complications: Secondary | ICD-10-CM | POA: Diagnosis not present

## 2016-11-16 DIAGNOSIS — I10 Essential (primary) hypertension: Secondary | ICD-10-CM | POA: Diagnosis not present

## 2016-11-16 DIAGNOSIS — R1311 Dysphagia, oral phase: Secondary | ICD-10-CM | POA: Diagnosis not present

## 2016-11-16 DIAGNOSIS — M109 Gout, unspecified: Secondary | ICD-10-CM | POA: Diagnosis not present

## 2016-11-16 DIAGNOSIS — R41841 Cognitive communication deficit: Secondary | ICD-10-CM | POA: Diagnosis not present

## 2016-11-16 DIAGNOSIS — I69354 Hemiplegia and hemiparesis following cerebral infarction affecting left non-dominant side: Secondary | ICD-10-CM | POA: Diagnosis not present

## 2016-11-19 DIAGNOSIS — E119 Type 2 diabetes mellitus without complications: Secondary | ICD-10-CM | POA: Diagnosis not present

## 2016-11-19 DIAGNOSIS — I69354 Hemiplegia and hemiparesis following cerebral infarction affecting left non-dominant side: Secondary | ICD-10-CM | POA: Diagnosis not present

## 2016-11-19 DIAGNOSIS — R41841 Cognitive communication deficit: Secondary | ICD-10-CM | POA: Diagnosis not present

## 2016-11-19 DIAGNOSIS — I10 Essential (primary) hypertension: Secondary | ICD-10-CM | POA: Diagnosis not present

## 2016-11-19 DIAGNOSIS — R1311 Dysphagia, oral phase: Secondary | ICD-10-CM | POA: Diagnosis not present

## 2016-11-19 DIAGNOSIS — M109 Gout, unspecified: Secondary | ICD-10-CM | POA: Diagnosis not present

## 2016-11-23 DIAGNOSIS — M109 Gout, unspecified: Secondary | ICD-10-CM | POA: Diagnosis not present

## 2016-11-23 DIAGNOSIS — R41841 Cognitive communication deficit: Secondary | ICD-10-CM | POA: Diagnosis not present

## 2016-11-23 DIAGNOSIS — I69354 Hemiplegia and hemiparesis following cerebral infarction affecting left non-dominant side: Secondary | ICD-10-CM | POA: Diagnosis not present

## 2016-11-23 DIAGNOSIS — R1311 Dysphagia, oral phase: Secondary | ICD-10-CM | POA: Diagnosis not present

## 2016-11-23 DIAGNOSIS — E119 Type 2 diabetes mellitus without complications: Secondary | ICD-10-CM | POA: Diagnosis not present

## 2016-11-23 DIAGNOSIS — I10 Essential (primary) hypertension: Secondary | ICD-10-CM | POA: Diagnosis not present

## 2016-11-24 DIAGNOSIS — R41841 Cognitive communication deficit: Secondary | ICD-10-CM | POA: Diagnosis not present

## 2016-11-24 DIAGNOSIS — I69354 Hemiplegia and hemiparesis following cerebral infarction affecting left non-dominant side: Secondary | ICD-10-CM | POA: Diagnosis not present

## 2016-11-24 DIAGNOSIS — R1311 Dysphagia, oral phase: Secondary | ICD-10-CM | POA: Diagnosis not present

## 2016-11-24 DIAGNOSIS — I10 Essential (primary) hypertension: Secondary | ICD-10-CM | POA: Diagnosis not present

## 2016-11-24 DIAGNOSIS — E119 Type 2 diabetes mellitus without complications: Secondary | ICD-10-CM | POA: Diagnosis not present

## 2016-11-24 DIAGNOSIS — M109 Gout, unspecified: Secondary | ICD-10-CM | POA: Diagnosis not present

## 2016-11-25 DIAGNOSIS — M109 Gout, unspecified: Secondary | ICD-10-CM | POA: Diagnosis not present

## 2016-11-25 DIAGNOSIS — R1311 Dysphagia, oral phase: Secondary | ICD-10-CM | POA: Diagnosis not present

## 2016-11-25 DIAGNOSIS — I10 Essential (primary) hypertension: Secondary | ICD-10-CM | POA: Diagnosis not present

## 2016-11-25 DIAGNOSIS — E119 Type 2 diabetes mellitus without complications: Secondary | ICD-10-CM | POA: Diagnosis not present

## 2016-11-25 DIAGNOSIS — I69354 Hemiplegia and hemiparesis following cerebral infarction affecting left non-dominant side: Secondary | ICD-10-CM | POA: Diagnosis not present

## 2016-11-25 DIAGNOSIS — R41841 Cognitive communication deficit: Secondary | ICD-10-CM | POA: Diagnosis not present

## 2016-11-27 DIAGNOSIS — I10 Essential (primary) hypertension: Secondary | ICD-10-CM | POA: Diagnosis not present

## 2016-11-27 DIAGNOSIS — M109 Gout, unspecified: Secondary | ICD-10-CM | POA: Diagnosis not present

## 2016-11-27 DIAGNOSIS — R1311 Dysphagia, oral phase: Secondary | ICD-10-CM | POA: Diagnosis not present

## 2016-11-27 DIAGNOSIS — I69354 Hemiplegia and hemiparesis following cerebral infarction affecting left non-dominant side: Secondary | ICD-10-CM | POA: Diagnosis not present

## 2016-11-27 DIAGNOSIS — E119 Type 2 diabetes mellitus without complications: Secondary | ICD-10-CM | POA: Diagnosis not present

## 2016-11-27 DIAGNOSIS — R41841 Cognitive communication deficit: Secondary | ICD-10-CM | POA: Diagnosis not present

## 2016-11-30 DIAGNOSIS — R41841 Cognitive communication deficit: Secondary | ICD-10-CM | POA: Diagnosis not present

## 2016-11-30 DIAGNOSIS — E119 Type 2 diabetes mellitus without complications: Secondary | ICD-10-CM | POA: Diagnosis not present

## 2016-11-30 DIAGNOSIS — M109 Gout, unspecified: Secondary | ICD-10-CM | POA: Diagnosis not present

## 2016-11-30 DIAGNOSIS — I69354 Hemiplegia and hemiparesis following cerebral infarction affecting left non-dominant side: Secondary | ICD-10-CM | POA: Diagnosis not present

## 2016-11-30 DIAGNOSIS — R1311 Dysphagia, oral phase: Secondary | ICD-10-CM | POA: Diagnosis not present

## 2016-11-30 DIAGNOSIS — I10 Essential (primary) hypertension: Secondary | ICD-10-CM | POA: Diagnosis not present

## 2016-12-01 ENCOUNTER — Telehealth: Payer: Self-pay | Admitting: Family Medicine

## 2016-12-01 NOTE — Telephone Encounter (Signed)
[facility] called -  She is requesting orders to extend home health speech therapy 2 times a week for 1 week.   cb number is [phone], ask for amy mckee

## 2016-12-01 NOTE — Telephone Encounter (Signed)
Left voicemail giving Lauren [name] the verbal order.

## 2016-12-01 NOTE — Telephone Encounter (Signed)
Please ok that verbal order  

## 2016-12-02 DIAGNOSIS — M109 Gout, unspecified: Secondary | ICD-10-CM | POA: Diagnosis not present

## 2016-12-02 DIAGNOSIS — I10 Essential (primary) hypertension: Secondary | ICD-10-CM | POA: Diagnosis not present

## 2016-12-02 DIAGNOSIS — I69354 Hemiplegia and hemiparesis following cerebral infarction affecting left non-dominant side: Secondary | ICD-10-CM | POA: Diagnosis not present

## 2016-12-02 DIAGNOSIS — R1311 Dysphagia, oral phase: Secondary | ICD-10-CM | POA: Diagnosis not present

## 2016-12-02 DIAGNOSIS — E119 Type 2 diabetes mellitus without complications: Secondary | ICD-10-CM | POA: Diagnosis not present

## 2016-12-02 DIAGNOSIS — R41841 Cognitive communication deficit: Secondary | ICD-10-CM | POA: Diagnosis not present

## 2016-12-04 DIAGNOSIS — R41841 Cognitive communication deficit: Secondary | ICD-10-CM | POA: Diagnosis not present

## 2016-12-04 DIAGNOSIS — I69354 Hemiplegia and hemiparesis following cerebral infarction affecting left non-dominant side: Secondary | ICD-10-CM | POA: Diagnosis not present

## 2016-12-04 DIAGNOSIS — R1311 Dysphagia, oral phase: Secondary | ICD-10-CM | POA: Diagnosis not present

## 2016-12-04 DIAGNOSIS — M109 Gout, unspecified: Secondary | ICD-10-CM | POA: Diagnosis not present

## 2016-12-04 DIAGNOSIS — E119 Type 2 diabetes mellitus without complications: Secondary | ICD-10-CM | POA: Diagnosis not present

## 2016-12-04 DIAGNOSIS — I10 Essential (primary) hypertension: Secondary | ICD-10-CM | POA: Diagnosis not present

## 2016-12-07 DIAGNOSIS — I10 Essential (primary) hypertension: Secondary | ICD-10-CM | POA: Diagnosis not present

## 2016-12-07 DIAGNOSIS — R1311 Dysphagia, oral phase: Secondary | ICD-10-CM | POA: Diagnosis not present

## 2016-12-07 DIAGNOSIS — E119 Type 2 diabetes mellitus without complications: Secondary | ICD-10-CM | POA: Diagnosis not present

## 2016-12-07 DIAGNOSIS — M109 Gout, unspecified: Secondary | ICD-10-CM | POA: Diagnosis not present

## 2016-12-07 DIAGNOSIS — I69354 Hemiplegia and hemiparesis following cerebral infarction affecting left non-dominant side: Secondary | ICD-10-CM | POA: Diagnosis not present

## 2016-12-07 DIAGNOSIS — R41841 Cognitive communication deficit: Secondary | ICD-10-CM | POA: Diagnosis not present

## 2016-12-11 DIAGNOSIS — R1311 Dysphagia, oral phase: Secondary | ICD-10-CM | POA: Diagnosis not present

## 2016-12-11 DIAGNOSIS — M109 Gout, unspecified: Secondary | ICD-10-CM | POA: Diagnosis not present

## 2016-12-11 DIAGNOSIS — E119 Type 2 diabetes mellitus without complications: Secondary | ICD-10-CM | POA: Diagnosis not present

## 2016-12-11 DIAGNOSIS — I10 Essential (primary) hypertension: Secondary | ICD-10-CM | POA: Diagnosis not present

## 2016-12-11 DIAGNOSIS — I69354 Hemiplegia and hemiparesis following cerebral infarction affecting left non-dominant side: Secondary | ICD-10-CM | POA: Diagnosis not present

## 2016-12-11 DIAGNOSIS — R41841 Cognitive communication deficit: Secondary | ICD-10-CM | POA: Diagnosis not present

## 2016-12-21 ENCOUNTER — Ambulatory Visit: Payer: Medicare Other | Admitting: Family Medicine

## 2016-12-21 ENCOUNTER — Ambulatory Visit (INDEPENDENT_AMBULATORY_CARE_PROVIDER_SITE_OTHER): Payer: Medicare Other | Admitting: Family Medicine

## 2016-12-21 ENCOUNTER — Telehealth: Payer: Self-pay | Admitting: Family Medicine

## 2016-12-21 ENCOUNTER — Encounter: Payer: Self-pay | Admitting: Family Medicine

## 2016-12-21 VITALS — BP 135/70 | HR 97 | Temp 97.3°F | Ht 62.5 in | Wt 125.5 lb

## 2016-12-21 DIAGNOSIS — I10 Essential (primary) hypertension: Secondary | ICD-10-CM | POA: Diagnosis not present

## 2016-12-21 DIAGNOSIS — I639 Cerebral infarction, unspecified: Secondary | ICD-10-CM | POA: Diagnosis not present

## 2016-12-21 MED ORDER — LISINOPRIL 5 MG PO TABS: 5.0000 mg | ORAL_TABLET | Freq: Every day | ORAL | 11 refills | Status: DC

## 2016-12-21 NOTE — Progress Notes (Signed)
Subjective:    Patient ID: Lauren [name], female    DOB: 10/31/1927, 83 y.o.   MRN: [id number]  HPI Here for concerns about blood pressure  83yo with hx of CVA in the past as well as DD and DM2    BP at home is elevated Readings range from 176H to 607P systolic (? avg 710G-269) Diastolic 48N-46   Mostly in 80s Reviewed records from home since July 27  This started mid to late July  Was doing PT and they did bp checks and it was always on the low side (diastolic)  Thinks her meter is accurate   No regular exercise   Does not think she has been anxious  Her baseline   Not eating any differently as a rule  Some folks have been bringing food in  Does not think she has eaten any processed foods  Did have some cheese yesterday     Wt Readings from Last 3 Encounters:  12/21/16 125 lb 8 oz (56.9 kg)  11/10/16 123 lb 8 oz (56 kg)  11/03/16 122 lb 12 oz (55.7 kg)   22.59 kg/m  occ she feels head pressure   Here: BP Readings from Last 3 Encounters:  12/21/16 (!) 144/72  11/10/16 122/60  11/03/16 128/82   Pulse Readings from Last 3 Encounters:  12/21/16 97  11/10/16 95  11/03/16 95   No longer takes fish oil   Lab Results  Component Value Date   CREATININE 0.73 08/15/2016   BUN 14 08/15/2016   NA 139 08/15/2016   K 4.3 08/15/2016   CL 106 08/15/2016   CO2 24 08/15/2016    Patient Active Problem List   Diagnosis Date Noted  . Diastolic dysfunction 27/07/5007  . Acute CVA (cerebrovascular accident) ([place]) 08/15/2016  . HTN (hypertension) 08/15/2016  . GERD (gastroesophageal reflux disease) 08/06/2015  . Hearing loss 08/06/2015  . Estrogen deficiency 05/01/2014  . Left knee pain 09/22/2013  . Chest pain, musculoskeletal 08/05/2013  . Encounter for Medicare annual wellness exam 04/04/2013  . Colon cancer screening 04/04/2013  . History of melanoma excision 09/27/2012  . History of breast cancer, left 01/08/2011  . History of pelvic fracture 09/17/2010   . DEGENERATIVE JOINT DISEASE 02/13/2010  . Migraine variant 11/06/2009  . Osteopenia 10/18/2008  . Transient ischemic attack 09/10/2008  . Diabetes type 2, controlled ([place]) 10/06/2007  . VENOUS INSUFFICIENCY 10/06/2007  . Diverticulosis of colon 10/06/2007  . FIBROCYSTIC BREAST DISEASE 10/06/2007  . HYPERCHOLESTEROLEMIA, PURE 11/29/2006   Past Medical History:  Diagnosis Date  . Breast cancer ([place])    left breast  . Chronic constipation   . Chronic headaches   . Diabetes mellitus   . Fibrocystic breast   . Gastritis   . Gout    injection in the past  . Hyperlipidemia   . Melanoma in situ of left upper extremity ([place])   . Pelvic fracture ([place])   . TIA (transient ischemic attack) 08/2008   neg MRI/MRA and CT and carotid dopplers  . Venous insufficiency    Past Surgical History:  Procedure Laterality Date  . APPENDECTOMY    . ARM SKIN LESION BIOPSY / EXCISION Right 11/02/2016  . BREAST CYST ASPIRATION    . BREAST LUMPECTOMY  09/2007   with sentinel LN biopsy  . BREAST LUMPECTOMY     left  . EYE SURGERY     retinal, then cataract   Social History  Substance Use Topics  . Smoking status:  Never Smoker  . Smokeless tobacco: Never Used  . Alcohol use No   Family History  Problem Relation Age of Onset  . Breast cancer Sister   . Stroke Sister   . Cancer Sister        breast  . Breast cancer Sister   . Stroke Sister   . Cancer Sister        breast  . Stroke Mother   . Stroke Father   . Stroke Brother   . Cancer Brother        prostate  . Stroke Brother   . Stroke Sister   . CAD Sister    Allergies  Allergen Reactions  . Sulfonamide Derivatives Itching  . Aspirin Other (See Comments)    Burning and pain in stomach   Current Outpatient Prescriptions on File Prior to Visit  Medication Sig Dispense Refill  . acetaminophen (TYLENOL) 325 MG tablet Take 2 tablets (650 mg total) by mouth every 6 (six) hours as needed for mild pain (or Fever >/= 101).    .  calcium carbonate (TUMS - DOSED IN MG ELEMENTAL CALCIUM) 500 MG chewable tablet Chew 1 tablet by mouth as needed for heartburn.     . citalopram (CELEXA) 10 MG tablet Take 0.5 tablets (5 mg total) by mouth daily. 15 tablet 11  . clopidogrel (PLAVIX) 75 MG tablet TAKE ONE TABLET BY MOUTH EVERY MORNING WITH BREAKFAST. 30 tablet 11  . glucose blood (ONE TOUCH ULTRA TEST) test strip USE AS DIRECTED TO CHECK BLOOD SUGAR ONCE A DAY AND AS NEEDED (DX. E11.9) 100 each 1  . hydrOXYzine (ATARAX/VISTARIL) 10 MG tablet Take 1 tablet (10 mg total) by mouth at bedtime as needed for itching. 30 tablet 1  . metFORMIN (GLUCOPHAGE) 500 MG tablet Take 0.5 tablets (250 mg total) by mouth 2 (two) times daily with a meal. 60 tablet 11  . Multiple Vitamins-Minerals (PRESERVISION AREDS 2 PO) Take 1 tablet by mouth 2 (two) times daily.    . pantoprazole (PROTONIX) 40 MG tablet Take 1 tablet (40 mg total) by mouth daily. 30 tablet 11  . polyethylene glycol powder (GLYCOLAX/MIRALAX) powder DISSOLVE ONE (1) CAPFUL (17GM) INTO 4 TO8 OUNCES OF FLUID AND TAKE BYMOUTH ONCE DAILY AS DIRECTED 527 g 0  . ranitidine (ZANTAC) 300 MG tablet Take 1 tablet (300 mg total) by mouth daily as needed for heartburn. 30 tablet 11  . simethicone (MYLICON) 80 MG chewable tablet Chew 80 mg by mouth every 6 (six) hours as needed for flatulence.    . simvastatin (ZOCOR) 20 MG tablet Take 1 tablet (20 mg total) by mouth at bedtime. 30 tablet 11   No current facility-administered medications on file prior to visit.     Review of Systems Review of Systems  Constitutional: Negative for fever, appetite change, fatigue and unexpected weight change.  Eyes: Negative for pain and visual disturbance.  Respiratory: Negative for cough and shortness of breath.   Cardiovascular: Negative for cp or palpitations    Gastrointestinal: Negative for nausea, diarrhea and constipation.  Genitourinary: Negative for urgency and frequency.  Skin: Negative for pallor or  rash   Neurological: Negative for weakness, light-headedness, numbness and headaches. pos for "head pressure" sensation when bp is higher (not headache) Hematological: Negative for adenopathy. Does not bruise/bleed easily.  Psychiatric/Behavioral: Negative for dysphoric mood. The patient is not nervous/anxious.         Objective:   Physical Exam  Constitutional: She appears well-developed and well-nourished. No  distress.  Well appearing elderly female    HENT:  Head: Normocephalic and atraumatic.  Mouth/Throat: Oropharynx is clear and moist.  Eyes: Pupils are equal, round, and reactive to light. Conjunctivae and EOM are normal.  Neck: Normal range of motion. Neck supple. No JVD present. Carotid bruit is not present. No thyromegaly present.  Cardiovascular: Normal rate, regular rhythm, normal heart sounds and intact distal pulses.  Exam reveals no gallop.   2nd bp done in seat with both feet on the ground and arm at heart level   Pulmonary/Chest: Effort normal and breath sounds normal. No respiratory distress. She has no wheezes. She has no rales.  No crackles  Abdominal: Soft. Bowel sounds are normal. She exhibits no distension, no abdominal bruit and no mass. There is no tenderness.  Musculoskeletal: She exhibits no edema.  Lymphadenopathy:    She has no cervical adenopathy.  Neurological: She is alert. She has normal reflexes. No cranial nerve deficit. She exhibits normal muscle tone. Coordination normal.  Skin: Skin is warm and dry. No rash noted. No pallor.  Psychiatric: Her speech is normal and behavior is normal. Her mood appears anxious.  Very mildly anxious- baseline           Assessment & Plan:   Problem List Items Addressed This Visit      Cardiovascular and Mediastinum   HTN (hypertension) - Primary    bp has been up since mid month at home BP: 135/70  This is better on 2nd check  Hx of CVA  Disc opt for tx -will try low dose ace (lisinopril 5) daily  Also for  renal protection (DM) Disc side eff such as hypotension and cough  Update if problems  DASH eating handout given Rev way to accurately take bp at home  Will bring cuff for a check at f/u also  F/u 4-6 weeks      Relevant Medications   lisinopril (PRINIVIL,ZESTRIL) 5 MG tablet

## 2016-12-21 NOTE — Assessment & Plan Note (Addendum)
bp has been up since mid month at home BP: 135/70  This is better on 2nd check  Hx of CVA  Disc opt for tx -will try low dose ace (lisinopril 5) daily  Also for renal protection (DM) Disc side eff such as hypotension and cough  Update if problems  DASH eating handout given Rev way to accurately take bp at home  Will bring cuff for a check at f/u also  F/u 4-6 weeks

## 2016-12-21 NOTE — Patient Instructions (Addendum)
Start the lisinopril 5 mg each day in the am   Stay active Avoid excessive sodium  If you develop any side effects (like low blood pressure or cough) let us know   Make sure you eat a healthy diet and drink water    Follow up in 2-4 weeks for blood pressure visit with me

## 2016-12-21 NOTE — Telephone Encounter (Signed)
[place] [place]  Patient Name: Lauren [name]  DOB: January 14, 1928    Initial Comment Caller states she is asking for an appt for someone else, over BP issues. BP: 145/87, last night: 187/86, 6pm 161/91.    Nurse Assessment  Nurse: Wynetta Emery, RN, Baker Janus Date/Time [name] Time): 12/21/2016 8:50:13 AM  Confirm and document reason for call. If symptomatic, describe symptoms. ---Lauren [name] is having blood pressure issues that started Saturday -- 1630 187/89 at that time then 1 hr later 161/91 continued to come down and Sunday it was 132/74 around 8am 134/79 then 145/87 -- she is complaining pressure in her head like she was Saturday is on Plavix not on any HTN agents  Does the patient have any new or worsening symptoms? ---Yes  Will a triage be completed? ---Yes  Related visit to physician within the last 2 weeks? ---No  Does the PT have any chronic conditions? (i.e. diabetes, asthma, etc.) ---Yes  List chronic conditions. ---CVA Diabetes 2 Essential hypertension  Is this a behavioral health or substance abuse call? ---No     Guidelines    Guideline Title Affirmed Question Affirmed Notes  High Blood Pressure [1] Systolic BP >= 660 OR Diastolic >= 80 AND [6] not taking BP medications    Final Disposition User   See PCP within [place], RN, Baker Janus    Referrals  GO TO FACILITY UNDECIDED   Disagree/Comply: Leta Baptist

## 2016-12-21 NOTE — Telephone Encounter (Signed)
Seeing her in the office now

## 2016-12-24 ENCOUNTER — Encounter: Payer: Self-pay | Admitting: Neurology

## 2016-12-24 ENCOUNTER — Ambulatory Visit (INDEPENDENT_AMBULATORY_CARE_PROVIDER_SITE_OTHER): Payer: Medicare Other | Admitting: Neurology

## 2016-12-24 VITALS — BP 130/72 | HR 97 | Ht 62.0 in | Wt 123.0 lb

## 2016-12-24 DIAGNOSIS — I639 Cerebral infarction, unspecified: Secondary | ICD-10-CM | POA: Diagnosis not present

## 2016-12-24 DIAGNOSIS — G459 Transient cerebral ischemic attack, unspecified: Secondary | ICD-10-CM | POA: Diagnosis not present

## 2016-12-24 NOTE — Patient Instructions (Addendum)
You can continue with your current medication regimen. No need to change anything.  I would not check the blood that often, it is of course up to Dr. Glori Bickers.  Please try to add some form of exercise to your day to day regimen.  Try to hydrate well with water.  Try to keep a schedule for your sleep.

## 2016-12-24 NOTE — Progress Notes (Signed)
Subjective:    Patient ID: Lauren [name] is a 83 y.o. female.  HPI     Interim history:   Lauren [name] is a very pleasant 83 year old right-handed woman with an underlying medical history of type 2 diabetes, melanoma, history of breast cancer, pelvic fracture, osteopenia, TIAs, venous insufficiency, diverticulosis, hyperlipidemia who presents for a follow-up consultation of her history of TIA and recent hospitalization for acute stroke. The patient is accompanied by her niece, Lauren [name] today. I last saw her on 09/23/2016, at which time she reported doing okay. She had recently been started on antibiotics for pneumonia and UTI. She was at [facility] for rehabilitation from 08/18/16 through 4/19. She had recuperated well from the stroke but had a setback because of acute illnesses including her UTI and pneumonia. She was trying to maintain a good spirit and eat well. She has good family support from nieces and nephews and extended family thankfully. Sadly, when she was hospitalized for her stroke her older sister died at the age of 58 from her advanced dementia. She was advised to continue with Plavix and her other medications, and we talked about secondary stroke prevention.  Today, 12/24/2016 (all dictated new, as well as above notes, some dictation done in note pad or Word, outside of chart, may appear as copied):  She reports doing okay but worries about her blood pressure. She had a higher blood pressure recently a few weeks ago. She had an appointment with her primary care physician started her on low-dose lisinopril. She took it one time but felt that her blood pressure was dropping too low and since then has not taken it. She does keep a daily log with her blood pressure, checks it several times a day even. She does not always drink enough water. She does not exercise on a day-to-day basis. Otherwise, she has remained stable.   The patient's allergies, current medications, family history, past medical  history, past social history, past surgical history and problem list were reviewed and updated as appropriate.    Previously (copied from previous notes for reference):   I saw her on 03/19/2016 after she was hospitalized for TIA. She had full stroke workup in the hospital in October 2017. We reviewed her test results and hospital records at the time. She was stable at the time. She did have 2 interim episodes of problems concentrating and getting her words out. Her discharge diagnosis in October 2017 was TIA versus migraine variant.   She presented to the hospital on 08/15/2016 with left-sided weakness including left facial droop. She had a brain MRI which showed acute right anterior cerebral artery territory infarct, primarily involving the corpus callosum. MRA head was negative for large vessel occlusion, echocardiogram showed EF of 13-08, grade 1 diastolic dysfunction, carotid Doppler studies showed less than 40% ICA plaquing, her therapist recommended skilled nursing facility upon discharge. She was advised to continue Plavix, LDL was 71, hemoglobin A1c was 6.7. A loop recorder was recommended but patient decided against it. She was seen by cardiology.   03/19/2016: She was in the hospital on 02/28/2016 and discharged on 03/02/2016. She presented with difficulty with her speech and shakiness. She did recall that she had a similar episode in 2014. She had an EEG in 2014 which was normal in the awake state. She had an EEG on 03/02/2016 and I reviewed the results: Impression: This awake and drowsy EEG is normal. CT head without contrast was negative for any acute abnormalities, CTA neck was negative  for any significant stenosis or occlusion. She had no need for physical therapy and occupational therapy after being evaluated by OT and PT. She was set up for 30 day cardiac monitoring as an outpatient.  She did not have a HA at the time, but in 2014 she did have an associated HA.    I reviewed the scan  results:  Ct Angio Head and Neck W Or Wo Contrast 02/28/2016 1. Negative for emergent large vessel occlusion.  2. Minimal atherosclerosis in the neck without stenosis. No anterior circulation large vessel or first order vessel stenosis.  3. Moderate to severe stenosis in the left ACA pericallosal artery and bilateral MCA distal posterior M2 or M3 branches.  4. Mild to moderate stenosis at the right PCA origin is new since 2014. Moderate to severe right P2 stenoses are new or progressed since 2014.  5. Mild to moderate aortic arch calcified atherosclerosis.    Ct Head Code Stroke W/o Cm 02/28/2016 1. Stable CT appearance of chronic small vessel disease. No acute intracranial hemorrhage or acute cortically based infarct identified.  2. ASPECTS is 10.    MR MRA Head without Contrast 02/29/2016 No acute infarction. Moderate chronic small-vessel ischemic changes of the cerebral hemispheric deep white matter. No intracranial major vessel occlusion or correctable proximal stenosis. Distal vessel atherosclerotic narrowing and irregularity most notable in multiple M2 segments bilaterally and in the PCAs, right more than left.   She had a TTE on 03/02/16 (Dr. Meda Coffee interpreted): Study Conclusions   - Left ventricle: The cavity size was normal. There was mild   concentric hypertrophy. Systolic function was vigorous. The   estimated ejection fraction was in the range of 65% to 70%. Wall   motion was normal; there were no regional wall motion   abnormalities. Doppler parameters are consistent with abnormal   left ventricular relaxation (grade 1 diastolic dysfunction).   Doppler parameters are consistent with elevated ventricular   end-diastolic filling pressure. - Aortic valve: Trileaflet; normal thickness leaflets. There was   mild regurgitation. - Aortic root: The aortic root was normal in size. - Mitral valve: Calcified annulus. Mildly thickened leaflets .   There was mild regurgitation. - Left  atrium: The atrium was normal in size. - Right ventricle: Systolic function was normal. - Tricuspid valve: There was mild regurgitation. - Pulmonary arteries: Systolic pressure was within the normal   range. - Inferior vena cava: The vessel was normal in size. - Pericardium, extracardiac: There was no pericardial effusion.   02/20/13:    I first met her on 11/17/2012 and she reported a TIA-like spell earlier in June and was seen in the hospital by my colleague Dr. Leonie Man. He changed her aspirin to Plavix and held her metformin due to low blood sugar values. She reported being in her normal state of health during the late moring on 10/27/12 when she had sudden onset of R hand and arm shaking, which she not control, and she could not get her words out. Her sister's caregiver called 911. She had a full stroke w/u in the hospital which I reviewed: Her CT showed a small plaque in the MCA M2 branch and white matter changes, she had negative carotid Doppler studies, there was no acute stroke on MRI brain and she had intracranial atherosclerosis on MRA head. Her blood work was unremarkable, with normal lipids, A1c of 6.3. Carotid doppler study in June 2014 showed 0-49% ICA stenosis. She had transient neurological symptoms including difficulty forming her words  and right arm shakiness, no facial droop. She has a long-standing history of migraine headaches, but also c/o a bandlike sensation in the front. For her headaches she takes tylenol. She was given neurontin by a HA specialist in the past, but it was not helpful.  Since she was discharged she has had more hand tremor and intermittent zoning out at time, no frank convulsions. She has had a hand tremor for years. Her 2 nephews have tremors.  Since her last visit she presented to the emergency room on 01/11/2013 with chest pain, relieved by nitroglycerin sublingual. An acute coronary syndrome was excluded. She had a stress test: On 01/12/2013 she had a normal Lexi  scan Myoview. She had a chest x-ray on 01/11/2013 which was negative for any acute or active cardiopulmonary disease. She had a head CT without contrast on 12/28/2012 which showed no acute intracranial abnormalities, near-complete opacification of the right sphenoid sinus, mild cerebral atrophy with extensive chronic microvascular ischemic changes. On 12/22/2012 she had a bilateral mammogram showing stable post lumpectomy site in the left breast and probably benign calcifications in the upper outer right breast which appear punctate and loosely grouped.  She saw her primary care physician on 01/25/2013 and endorsed anxiety and stressors. At the time of her first visit with me I ordered an EEG. This was normal in the awake state. I felt that her headaches were primarily tension-type headaches. She also had evidence of essential tremor, very mild. She is doing better in all aspect, she is starting to gain some weight back.   His Past Medical History Is Significant For: Past Medical History:  Diagnosis Date  . Breast cancer ([place])    left breast  . Chronic constipation   . Chronic headaches   . Diabetes mellitus   . Fibrocystic breast   . Gastritis   . Gout    injection in the past  . Hyperlipidemia   . Melanoma in situ of left upper extremity ([place])   . Pelvic fracture ([place])   . TIA (transient ischemic attack) 08/2008   neg MRI/MRA and CT and carotid dopplers  . Venous insufficiency     His Past Surgical History Is Significant For: Past Surgical History:  Procedure Laterality Date  . APPENDECTOMY    . ARM SKIN LESION BIOPSY / EXCISION Right 11/02/2016  . BREAST CYST ASPIRATION    . BREAST LUMPECTOMY  09/2007   with sentinel LN biopsy  . BREAST LUMPECTOMY     left  . EYE SURGERY     retinal, then cataract    His Family History Is Significant For: Family History  Problem Relation Age of Onset  . Breast cancer Sister   . Stroke Sister   . Cancer Sister        breast  . Breast  cancer Sister   . Stroke Sister   . Cancer Sister        breast  . Stroke Mother   . Stroke Father   . Stroke Brother   . Cancer Brother        prostate  . Stroke Brother   . Stroke Sister   . CAD Sister     His Social History Is Significant For: Social History   Social History  . Marital status: Single    Spouse name: N/A  . Number of children: N/A  . Years of education: college   Social History Main Topics  . Smoking status: Never Smoker  . Smokeless tobacco: Never  Used  . Alcohol use No  . Drug use: No  . Sexual activity: Not Currently   Other Topics Concern  . None   Social History Narrative   Lives with and cares for both sisters   Strong faith   Denies caffeine use     His Allergies Are:  Allergies  Allergen Reactions  . Sulfonamide Derivatives Itching  . Aspirin Other (See Comments)    Burning and pain in stomach  :   His Current Medications Are:  Outpatient Encounter Prescriptions as of 12/24/2016  Medication Sig  . acetaminophen (TYLENOL) 325 MG tablet Take 2 tablets (650 mg total) by mouth every 6 (six) hours as needed for mild pain (or Fever >/= 101).  . calcium carbonate (TUMS - DOSED IN MG ELEMENTAL CALCIUM) 500 MG chewable tablet Chew 1 tablet by mouth as needed for heartburn.   . citalopram (CELEXA) 10 MG tablet Take 0.5 tablets (5 mg total) by mouth daily.  . clopidogrel (PLAVIX) 75 MG tablet TAKE ONE TABLET BY MOUTH EVERY MORNING WITH BREAKFAST.  Marland Kitchen glucose blood (ONE TOUCH ULTRA TEST) test strip USE AS DIRECTED TO CHECK BLOOD SUGAR ONCE A DAY AND AS NEEDED (DX. E11.9)  . hydrOXYzine (ATARAX/VISTARIL) 10 MG tablet Take 1 tablet (10 mg total) by mouth at bedtime as needed for itching.  . metFORMIN (GLUCOPHAGE) 500 MG tablet Take 0.5 tablets (250 mg total) by mouth 2 (two) times daily with a meal.  . Multiple Vitamins-Minerals (PRESERVISION AREDS 2 PO) Take 1 tablet by mouth 2 (two) times daily.  . pantoprazole (PROTONIX) 40 MG tablet Take 1  tablet (40 mg total) by mouth daily.  . polyethylene glycol powder (GLYCOLAX/MIRALAX) powder DISSOLVE ONE (1) CAPFUL (17GM) INTO 4 TO8 OUNCES OF FLUID AND TAKE BYMOUTH ONCE DAILY AS DIRECTED  . ranitidine (ZANTAC) 300 MG tablet Take 1 tablet (300 mg total) by mouth daily as needed for heartburn.  . simethicone (MYLICON) 80 MG chewable tablet Chew 80 mg by mouth every 6 (six) hours as needed for flatulence.  . simvastatin (ZOCOR) 20 MG tablet Take 1 tablet (20 mg total) by mouth at bedtime.  Marland Kitchen lisinopril (PRINIVIL,ZESTRIL) 5 MG tablet Take 1 tablet (5 mg total) by mouth daily. (Patient not taking: Reported on 12/24/2016)   No facility-administered encounter medications on file as of 12/24/2016.   :  Review of Systems:  Out of a complete 14 point review of systems, all are reviewed and negative with the exception of these symptoms as listed below:  Review of Systems  Neurological:       Pt presents today to discuss her BP. Pt reports that her BP has been spiking recently and was started on lisinopril. Pt took lisinopril one time and BP seems to be better. Pt has not continued lisinopril.    Objective:  Neurological Exam  Physical Exam Physical Examination:   Vitals:   12/24/16 1045  BP: 130/72  Pulse: 97   General Examination: The patient is a very pleasant 83 y.o. female in no acute distress. She appears well-developed and well-nourished and well groomed.   HEENT: Normocephalic, atraumatic, pupils are equal, round and reactive to light and accommodation. Extraocular tracking is good without limitation to gaze excursion or nystagmus noted. Normal smooth pursuit is noted. Hearing is grossly intact. Face is symmetric with normal facial animation and normal facial sensation. Speech is clear with no dysarthria noted. There is mildly softer speech. She does have a tiny little bit of lower jaw and  head tremor. Oropharynx exam reveals: mild mouth dryness, adequate dental hygiene and mild airway  crowding. Mallampati is class II. Tongue protrudes centrally and palate elevates symmetrically.   Chest: Clear to auscultation without wheezing, rhonchi or crackles noted.  Heart: S1+S2+0, regular and normal without murmurs, rubs or gallops noted.   Abdomen: Soft, non-tender and non-distended with normal bowel sounds appreciated on auscultation.  Extremities: There is no pitting edema in the distal lower extremities bilaterally. Pedal pulses are intact.  Skin: Warm and dry without trophic changes noted. There are no varicose veins.  Musculoskeletal: exam reveals no obvious joint deformities, tenderness or joint swelling or erythema.   Neurologically:  Mental status: The patient is awake, alert and oriented in all 4 spheres. Her memory, attention, language and knowledge are appropriate. There is no aphasia, agnosia, apraxia or anomia. Speech is clear with normal prosody and enunciation. Thought process is linear. Mood is congruent and affect is normal.  Cranial nerves are as described above under HEENT exam. In addition, shoulder shrug is normal with equal shoulder height noted. Motor exam: Normal bulk, strength and tone is noted. There is no drift, or rebound. She has a mild intermittent resting tremor on the R hand and a mild bilateral intermittent postural and action tremor bilaterally, R more than L, overall stable. Reflexes are about 1+ throughout. Fine motor skills are mildly impaired bilaterally.   Cerebellar testing shows no dysmetria or intention tremor on finger to nose testing. There is no truncal or gait ataxia.  Sensory exam is intact to light touch in the upper and lower extremities.  Gait, station and balance: She stands up with mild difficulty and her posture is mildly stooped, but fairly age-appropriate. She walks cautiously and turns in 3 steps. She has a mild decrease in arm swing bilaterally.she has a single-point cane.    Assessment and Plan:   In summary, Lauren [name] is a very pleasant 83 year old female with a history of headaches, tremors, TIAs and stroke, who presents for follow-up. she had recent extensive stroke workup. Her exam is stable. She has had no new complaints with the exception of worrying about her blood pressure which has in our records remained fairly stable and in the good range. Nevertheless, she kept a very detailed daily log of her blood pressure for the past few weeks. She has an appointment with her primary care coming up. From my end of things, she is advised to continue with her current medication regimen, she is advised to stay better hydrated with water and add some form of day-to-day exercise to her regimen. She has good family support. She has no new neurological complaints. I suggested a 6 month follow-up, sooner as needed.  I answered all their questions today and the patient and Lauren [name] were in agreement.  I spent 25 minutes in total face-to-face time with the patient, more than 50% of which was spent in counseling and coordination of care, reviewing test results, reviewing medication and discussing or reviewing the diagnosis of TIA and stroke, the prognosis and treatment options. Pertinent laboratory and imaging test results that were available during this visit with the patient were reviewed by me and considered in my medical decision making (see chart for details).

## 2016-12-31 ENCOUNTER — Other Ambulatory Visit: Payer: Self-pay | Admitting: Family Medicine

## 2017-01-05 ENCOUNTER — Ambulatory Visit
Admission: RE | Admit: 2017-01-05 | Discharge: 2017-01-05 | Disposition: A | Discharge status: Home / Self Care | Payer: Medicare Other | Source: Ambulatory Visit | Attending: Adult Health | Admitting: Adult Health

## 2017-01-05 DIAGNOSIS — Z1231 Encounter for screening mammogram for malignant neoplasm of breast: Secondary | ICD-10-CM | POA: Diagnosis not present

## 2017-01-05 DIAGNOSIS — Z853 Personal history of malignant neoplasm of breast: Secondary | ICD-10-CM

## 2017-01-12 ENCOUNTER — Ambulatory Visit (HOSPITAL_BASED_OUTPATIENT_CLINIC_OR_DEPARTMENT_OTHER): Payer: Medicare Other | Admitting: Adult Health

## 2017-01-12 ENCOUNTER — Encounter: Payer: Self-pay | Admitting: Adult Health

## 2017-01-12 VITALS — BP 153/53 | HR 91 | Temp 97.7°F | Resp 18 | Ht 62.0 in | Wt 124.3 lb

## 2017-01-12 DIAGNOSIS — Z853 Personal history of malignant neoplasm of breast: Secondary | ICD-10-CM

## 2017-01-12 NOTE — Progress Notes (Signed)
CLINIC:  Survivorship   REASON FOR VISIT:  Routine follow-up for history of breast cancer.   BRIEF ONCOLOGIC HISTORY:    Breast cancer, left breast ([place]) (Resolved)   11/08/2007 Surgery    Left breast lumpectomy: 0.8 cm invasive ductal carcinoma grade 1 with intermediate grade DCIS, 0/2 sentinel lymph nodes, LVI present, ER 73% PR 93% Ki-67 10% HER-2/neu negative T1b N0 M0 stage IA      11/22/2007 - 11/21/2012 Anti-estrogen oral therapy    Femara 2.5 mg daily      06/06/2014 Imaging    DEXA scan: Osteopenia      01/03/2016 Mammogram    No evidence of malignancy within either breast. Stable postsurgical changes.        INTERVAL HISTORY:  Ms. Dow presents to the Survivorship Clinic today for routine follow-up for her history of breast cancer.  Overall, she reports feeling quite well. She is unsure if she wants to participate in any further imaging or breast exams with [place] at the cancer center. She does have some issues with her blood pressure and her headache and is following up with her PCP on Friday to discuss this.  She did have a stroke, and has completed a stay in rehab along with home physical therapy.  She is walking more recently.      REVIEW OF SYSTEMS:  Review of Systems  Constitutional: Negative for appetite change, chills, fatigue, fever and unexpected weight change.  HENT:   Negative for hearing loss and lump/mass.   Eyes: Negative for eye problems and icterus.  Respiratory: Negative for chest tightness, cough and shortness of breath.   Cardiovascular: Negative for chest pain, leg swelling and palpitations.  Gastrointestinal: Negative for abdominal distention, abdominal pain, constipation, nausea and vomiting.  Endocrine: Negative for hot flashes.  Genitourinary: Negative for difficulty urinating.   Skin: Negative for itching and rash.  Neurological: Positive for headaches (due to her blood pressure).  Hematological: Negative for adenopathy. Does not  bruise/bleed easily.  Psychiatric/Behavioral: Negative for depression. The patient is not nervous/anxious.   Breast: Denies any new nodularity, masses, tenderness, nipple changes, or nipple discharge.       PAST MEDICAL/SURGICAL HISTORY:  Past Medical History:  Diagnosis Date  . Breast cancer ([place])    left breast  . Chronic constipation   . Chronic headaches   . Diabetes mellitus   . Fibrocystic breast   . Gastritis   . Gout    injection in the past  . Hyperlipidemia   . Melanoma in situ of left upper extremity ([place])   . Pelvic fracture ([place])   . TIA (transient ischemic attack) 08/2008   neg MRI/MRA and CT and carotid dopplers  . Venous insufficiency    Past Surgical History:  Procedure Laterality Date  . APPENDECTOMY    . ARM SKIN LESION BIOPSY / EXCISION Right 11/02/2016  . BREAST BIOPSY Left 10/18/2007  . BREAST CYST ASPIRATION    . BREAST EXCISIONAL BIOPSY Right    x2  . BREAST LUMPECTOMY  09/2007   with sentinel LN biopsy  . BREAST LUMPECTOMY     left  . EYE SURGERY     retinal, then cataract     ALLERGIES:  Allergies  Allergen Reactions  . Sulfonamide Derivatives Itching  . Aspirin Other (See Comments)    Burning and pain in stomach     CURRENT MEDICATIONS:  Outpatient Encounter Prescriptions as of 01/12/2017  Medication Sig  . acetaminophen (TYLENOL) 325 MG tablet  Take 2 tablets (650 mg total) by mouth every 6 (six) hours as needed for mild pain (or Fever >/= 101).  . calcium carbonate (TUMS - DOSED IN MG ELEMENTAL CALCIUM) 500 MG chewable tablet Chew 1 tablet by mouth as needed for heartburn.   . citalopram (CELEXA) 10 MG tablet Take 0.5 tablets (5 mg total) by mouth daily.  . clopidogrel (PLAVIX) 75 MG tablet TAKE ONE TABLET BY MOUTH EVERY MORNING WITH BREAKFAST.  Marland Kitchen glucose blood (ONE TOUCH ULTRA TEST) test strip USE AS DIRECTED TO CHECK BLOOD SUGAR ONCE A DAY AND AS NEEDED (DX. E11.9)  . metFORMIN (GLUCOPHAGE) 500 MG tablet Take 0.5 tablets (250  mg total) by mouth 2 (two) times daily with a meal.  . Multiple Vitamins-Minerals (PRESERVISION AREDS 2 PO) Take 1 tablet by mouth 2 (two) times daily.  . pantoprazole (PROTONIX) 40 MG tablet Take 1 tablet (40 mg total) by mouth daily.  . polyethylene glycol powder (GLYCOLAX/MIRALAX) powder DISSOLVE ONE (1) CAPFUL (17GM) INTO 4 TO8 OUNCES OF FLUID AND TAKE BYMOUTH ONCE DAILY AS DIRECTED  . ranitidine (ZANTAC) 300 MG tablet Take 1 tablet (300 mg total) by mouth daily as needed for heartburn.  . simethicone (MYLICON) 80 MG chewable tablet Chew 80 mg by mouth every 6 (six) hours as needed for flatulence.  . simvastatin (ZOCOR) 20 MG tablet Take 1 tablet (20 mg total) by mouth at bedtime.  . hydrOXYzine (ATARAX/VISTARIL) 10 MG tablet Take 1 tablet (10 mg total) by mouth at bedtime as needed for itching. (Patient not taking: Reported on 01/12/2017)  . lisinopril (PRINIVIL,ZESTRIL) 5 MG tablet Take 1 tablet (5 mg total) by mouth daily. (Patient not taking: Reported on 01/12/2017)   No facility-administered encounter medications on file as of 01/12/2017.      ONCOLOGIC FAMILY HISTORY:  Family History  Problem Relation Age of Onset  . Breast cancer Sister   . Stroke Sister   . Cancer Sister        breast  . Breast cancer Sister   . Stroke Sister   . Cancer Sister        breast  . Stroke Mother   . Stroke Father   . Stroke Brother   . Cancer Brother        prostate  . Stroke Brother   . Stroke Sister   . CAD Sister      SOCIAL HISTORY:  REMIE MATHISON is single and lives alone in [place], [place].  She has home health four times a week.  Ms. Froio is currently retired.  She denies any current or history of tobacco, alcohol, or illicit drug use.     PHYSICAL EXAMINATION:  Vital Signs: Vitals:   01/12/17 1111  BP: (!) 153/53  Pulse: 91  Resp: 18  Temp: 97.7 F (36.5 C)  SpO2: 97%   Filed Weights   01/12/17 1111  Weight: 124 lb 4.8 oz (56.4 kg)   General:  Well-nourished, well-appearing female in no acute distress.  Accompanied by her care assistant [place] today.   HEENT: Head is normocephalic.  Pupils equal and reactive to light. Conjunctivae clear without exudate.  Sclerae anicteric. Oral mucosa is pink, moist.  Oropharynx is pink without lesions or erythema.  Lymph: No cervical, supraclavicular, or infraclavicular lymphadenopathy noted on palpation.  Cardiovascular: Regular rate and rhythm.Marland Kitchen Respiratory: Clear to auscultation bilaterally. Chest expansion symmetric; breathing non-labored.  Breast Exam:  -Left breast: No appreciable masses on palpation. No skin redness, thickening, or peau  d'orange appearance; no nipple retraction or nipple discharge; mild distortion in symmetry at previous lumpectomy site well healed scar without erythema or nodularity.  -Right breast: No appreciable masses on palpation. No skin redness, thickening, or peau d'orange appearance; no nipple retraction or nipple discharge;  -Axilla: No axillary adenopathy bilaterally.  GI: Abdomen soft and round; non-tender, non-distended. Bowel sounds normoactive. No hepatosplenomegaly.   GU: Deferred.  Neuro: No focal deficits. Steady gait.  Psych: Mood and affect normal and appropriate for situation.  MSK: No focal spinal tenderness to palpation, full range of motion in bilateral upper extremities Extremities: No edema. Skin: Warm and dry.  LABORATORY DATA:  None for this visit   DIAGNOSTIC IMAGING:  Most recent mammogram:      ASSESSMENT AND PLAN:  Ms.. Virrueta is a pleasant 83 y.o. female with history of Stage IA left breast invasive ductal carcinoma, ER+/PR+/HER2-, diagnosed in 10/2007, treated with lumpectomy, and five years of Letrozole completing therapy in 2014.  She presents to the Survivorship Clinic for surveillance and routine follow-up.   1. History of breast cancer:  Ms. Kretz is currently clinically and radiographically without evidence of disease or  recurrence of breast cancer. She will be due for mammogram in 12/2017. After a discussion of risks, benefits, including her age, Namita has opted to stop following up at the cancer center any longer.  She will follow up with her PCP for further breast exams.  She also will not undergo any further mammography unless her PCP notes an abnormality in her physical exam and then they will discuss whether or not to explore it further.    2. Bone health:  Given Ms. Hutchinson's age, history of breast cancer, and her previous anti-estrogen therapy with Letrozole, she is at risk for bone demineralization. Her last DEXA scan was in 05/2014 and was consistent with osteopenia with a T score of -1.7 in her left femur.  She was given education on specific food and activities to promote bone health.  She will continue to follow up with her PCP regarding her bone density testing and management.   3. Cancer screening:  Due to Ms. Freiberger's history and her age, she should receive screening for skin cancers. She was encouraged to follow-up with her PCP for appropriate cancer screenings.   4. Health maintenance and wellness promotion: Ms. Furrow was encouraged to consume 5-7 servings of fruits and vegetables per day. She was also encouraged to engage in exercise as she is able She was instructed to limit her alcohol consumption and continue to abstain from tobacco use.  Dispo:  -Return to cancer center on an as needed basis   A total of (30) minutes of face-to-face time was spent with this patient with greater than 50% of that time in counseling and care-coordination.   Gardenia Phlegm, NP Survivorship Program [facility] [phone]   Note: PRIMARY CARE PROVIDER Abner Greenspan, [place] [phone]

## 2017-01-15 ENCOUNTER — Encounter: Payer: Medicare Other | Admitting: Adult Health

## 2017-01-15 ENCOUNTER — Ambulatory Visit (INDEPENDENT_AMBULATORY_CARE_PROVIDER_SITE_OTHER): Payer: Medicare Other | Admitting: Family Medicine

## 2017-01-15 ENCOUNTER — Encounter: Payer: Self-pay | Admitting: Family Medicine

## 2017-01-15 VITALS — BP 110/58 | HR 102 | Temp 97.8°F | Resp 16 | Ht 62.0 in | Wt 124.4 lb

## 2017-01-15 DIAGNOSIS — I639 Cerebral infarction, unspecified: Secondary | ICD-10-CM

## 2017-01-15 DIAGNOSIS — I10 Essential (primary) hypertension: Secondary | ICD-10-CM

## 2017-01-15 NOTE — Assessment & Plan Note (Addendum)
bp in fair control at this time  BP Readings from Last 1 Encounters:  01/15/17 (!) 110/58   No changes needed (no meds- keep watching sodium intake)  Disc lifstyle change with low sodium diet and exercise  Rev labs from march  F/u oct  Will stay off lisinopril (dropped pressure too low)  Check at home no more than once weekly  Pulse is up- may be anxiety /office visit  Will continue to follow  Re assuring exam

## 2017-01-15 NOTE — Progress Notes (Signed)
Subjective:    Patient ID: Lauren [name], female    DOB: Nov 11, 1927, 83 y.o.   MRN: [id number]  HPI  Here for f/u of chronic health problems   Wt Readings from Last 3 Encounters:  01/15/17 124 lb 6.4 oz (56.4 kg)  01/12/17 124 lb 4.8 oz (56.4 kg)  12/24/16 123 lb (55.8 kg)   22.75 kg/m  bp is stable today  No cp or palpitations or headaches or edema  No side effects to medicines  BP Readings from Last 3 Encounters:  01/15/17 (!) 110/58  01/12/17 (!) 153/53  12/24/16 130/72    Started low dose ace last visit  Thinks bp got too low - and she got dizzy (only took it once) and systolic went down to 90  Her cuff at home is running high     Pulse Readings from Last 3 Encounters:  01/15/17 (!) 102  01/12/17 91  12/24/16 97    Lab Results  Component Value Date   CREATININE 0.73 08/15/2016   BUN 14 08/15/2016   NA 139 08/15/2016   K 4.3 08/15/2016   CL 106 08/15/2016   CO2 24 08/15/2016   Lab Results  Component Value Date   ALT 13 (L) 08/15/2016   AST 22 08/15/2016   ALKPHOS 60 08/15/2016   BILITOT 0.5 08/15/2016    Lab Results  Component Value Date   HGBA1C 6.7 (H) 08/15/2016    Patient Active Problem List   Diagnosis Date Noted  . Diastolic dysfunction 82/99/3716  . Acute CVA (cerebrovascular accident) ([place]) 08/15/2016  . HTN (hypertension) 08/15/2016  . GERD (gastroesophageal reflux disease) 08/06/2015  . Hearing loss 08/06/2015  . Estrogen deficiency 05/01/2014  . Left knee pain 09/22/2013  . Chest pain, musculoskeletal 08/05/2013  . Encounter for Medicare annual wellness exam 04/04/2013  . Colon cancer screening 04/04/2013  . History of melanoma excision 09/27/2012  . History of breast cancer, left 01/08/2011  . History of pelvic fracture 09/17/2010  . DEGENERATIVE JOINT DISEASE 02/13/2010  . Migraine variant 11/06/2009  . Osteopenia 10/18/2008  . Transient ischemic attack 09/10/2008  . Diabetes type 2, controlled ([place]) 10/06/2007  . VENOUS  INSUFFICIENCY 10/06/2007  . Diverticulosis of colon 10/06/2007  . FIBROCYSTIC BREAST DISEASE 10/06/2007  . HYPERCHOLESTEROLEMIA, PURE 11/29/2006   Past Medical History:  Diagnosis Date  . Breast cancer ([place])    left breast  . Chronic constipation   . Chronic headaches   . Diabetes mellitus   . Fibrocystic breast   . Gastritis   . Gout    injection in the past  . Hyperlipidemia   . Melanoma in situ of left upper extremity ([place])   . Pelvic fracture ([place])   . TIA (transient ischemic attack) 08/2008   neg MRI/MRA and CT and carotid dopplers  . Venous insufficiency    Past Surgical History:  Procedure Laterality Date  . APPENDECTOMY    . ARM SKIN LESION BIOPSY / EXCISION Right 11/02/2016  . BREAST BIOPSY Left 10/18/2007  . BREAST CYST ASPIRATION    . BREAST EXCISIONAL BIOPSY Right    x2  . BREAST LUMPECTOMY  09/2007   with sentinel LN biopsy  . BREAST LUMPECTOMY     left  . EYE SURGERY     retinal, then cataract   Social History  Substance Use Topics  . Smoking status: Never Smoker  . Smokeless tobacco: Never Used  . Alcohol use No   Family History  Problem Relation Age of  Onset  . Breast cancer Sister   . Stroke Sister   . Cancer Sister        breast  . Breast cancer Sister   . Stroke Sister   . Cancer Sister        breast  . Stroke Mother   . Stroke Father   . Stroke Brother   . Cancer Brother        prostate  . Stroke Brother   . Stroke Sister   . CAD Sister    Allergies  Allergen Reactions  . Sulfonamide Derivatives Itching  . Aspirin Other (See Comments)    Burning and pain in stomach   Current Outpatient Prescriptions on File Prior to Visit  Medication Sig Dispense Refill  . acetaminophen (TYLENOL) 325 MG tablet Take 2 tablets (650 mg total) by mouth every 6 (six) hours as needed for mild pain (or Fever >/= 101).    . calcium carbonate (TUMS - DOSED IN MG ELEMENTAL CALCIUM) 500 MG chewable tablet Chew 1 tablet by mouth as needed for heartburn.      . citalopram (CELEXA) 10 MG tablet Take 0.5 tablets (5 mg total) by mouth daily. 15 tablet 11  . clopidogrel (PLAVIX) 75 MG tablet TAKE ONE TABLET BY MOUTH EVERY MORNING WITH BREAKFAST. 30 tablet 11  . glucose blood (ONE TOUCH ULTRA TEST) test strip USE AS DIRECTED TO CHECK BLOOD SUGAR ONCE A DAY AND AS NEEDED (DX. E11.9) 100 each 1  . metFORMIN (GLUCOPHAGE) 500 MG tablet Take 0.5 tablets (250 mg total) by mouth 2 (two) times daily with a meal. 60 tablet 11  . Multiple Vitamins-Minerals (PRESERVISION AREDS 2 PO) Take 1 tablet by mouth 2 (two) times daily.    . pantoprazole (PROTONIX) 40 MG tablet Take 1 tablet (40 mg total) by mouth daily. 30 tablet 11  . polyethylene glycol powder (GLYCOLAX/MIRALAX) powder DISSOLVE ONE (1) CAPFUL (17GM) INTO 4 TO8 OUNCES OF FLUID AND TAKE BYMOUTH ONCE DAILY AS DIRECTED 527 g 0  . ranitidine (ZANTAC) 300 MG tablet Take 1 tablet (300 mg total) by mouth daily as needed for heartburn. 30 tablet 11  . simethicone (MYLICON) 80 MG chewable tablet Chew 80 mg by mouth every 6 (six) hours as needed for flatulence.    . simvastatin (ZOCOR) 20 MG tablet Take 1 tablet (20 mg total) by mouth at bedtime. 30 tablet 11   No current facility-administered medications on file prior to visit.     Review of Systems    Review of Systems  Constitutional: Negative for fever, appetite change, fatigue and unexpected weight change.  Eyes: Negative for pain and visual disturbance.  Respiratory: Negative for cough and shortness of breath.   Cardiovascular: Negative for cp or palpitations    Gastrointestinal: Negative for nausea, diarrhea and constipation.  Genitourinary: Negative for urgency and frequency.  Skin: Negative for pallor or rash   Neurological: Negative for weakness, light-headedness, numbness and headaches.  Hematological: Negative for adenopathy. Does not bruise/bleed easily.  Psychiatric/Behavioral: Negative for dysphoric mood. The patient is sometimes   nervous/anxious.      Objective:   Physical Exam  Constitutional: She appears well-developed and well-nourished. No distress.  Well appearing elderly female with slow speech  HENT:  Head: Normocephalic and atraumatic.  Mouth/Throat: Oropharynx is clear and moist.  Eyes: Pupils are equal, round, and reactive to light. Conjunctivae and EOM are normal.  Neck: Normal range of motion. Neck supple. No JVD present. Carotid bruit is not present. No  thyromegaly present.  Cardiovascular: Normal rate, regular rhythm, normal heart sounds and intact distal pulses.  Exam reveals no gallop.   Pulmonary/Chest: Effort normal and breath sounds normal. No respiratory distress. She has no wheezes. She has no rales.  No crackles  Abdominal: She exhibits no abdominal bruit.  Musculoskeletal: She exhibits no edema.  Lymphadenopathy:    She has no cervical adenopathy.  Neurological: She is alert. She has normal reflexes.  Skin: Skin is warm and dry. No rash noted.  Psychiatric: She has a normal mood and affect.          Assessment & Plan:   Problem List Items Addressed This Visit      Cardiovascular and Mediastinum   HTN (hypertension) - Primary    bp in fair control at this time  BP Readings from Last 1 Encounters:  01/15/17 (!) 110/58   No changes needed (no meds- keep watching sodium intake)  Disc lifstyle change with low sodium diet and exercise  Rev labs from march  F/u oct  Will stay off lisinopril (dropped pressure too low)  Check at home no more than once weekly  Pulse is up- may be anxiety /office visit  Will continue to follow  Re assuring exam

## 2017-01-15 NOTE — Patient Instructions (Addendum)
Do not re start the lisinopril   Blood pressure looks great!  Check at home once weekly or as needed   Stay active  Keep walking

## 2017-02-15 ENCOUNTER — Encounter (INDEPENDENT_AMBULATORY_CARE_PROVIDER_SITE_OTHER): Payer: Medicare Other | Admitting: Ophthalmology

## 2017-02-15 DIAGNOSIS — E11319 Type 2 diabetes mellitus with unspecified diabetic retinopathy without macular edema: Secondary | ICD-10-CM

## 2017-02-15 DIAGNOSIS — H353122 Nonexudative age-related macular degeneration, left eye, intermediate dry stage: Secondary | ICD-10-CM

## 2017-02-15 DIAGNOSIS — H43813 Vitreous degeneration, bilateral: Secondary | ICD-10-CM | POA: Diagnosis not present

## 2017-02-15 DIAGNOSIS — E113293 Type 2 diabetes mellitus with mild nonproliferative diabetic retinopathy without macular edema, bilateral: Secondary | ICD-10-CM

## 2017-02-15 DIAGNOSIS — H3509 Other intraretinal microvascular abnormalities: Secondary | ICD-10-CM | POA: Diagnosis not present

## 2017-02-15 DIAGNOSIS — H353111 Nonexudative age-related macular degeneration, right eye, early dry stage: Secondary | ICD-10-CM | POA: Diagnosis not present

## 2017-02-23 ENCOUNTER — Other Ambulatory Visit (INDEPENDENT_AMBULATORY_CARE_PROVIDER_SITE_OTHER): Payer: Medicare Other

## 2017-02-23 DIAGNOSIS — E1159 Type 2 diabetes mellitus with other circulatory complications: Secondary | ICD-10-CM | POA: Diagnosis not present

## 2017-02-23 DIAGNOSIS — I10 Essential (primary) hypertension: Secondary | ICD-10-CM

## 2017-02-23 DIAGNOSIS — E7800 Pure hypercholesterolemia, unspecified: Secondary | ICD-10-CM | POA: Diagnosis not present

## 2017-02-23 LAB — CBC WITH DIFFERENTIAL/PLATELET
Basophils Absolute: 0.1 10*3/uL (ref 0.0–0.1)
Basophils Relative: 1.3 % (ref 0.0–3.0)
Eosinophils Absolute: 0.2 10*3/uL (ref 0.0–0.7)
Eosinophils Relative: 2 % (ref 0.0–5.0)
HEMATOCRIT: 43.5 % (ref 36.0–46.0)
HEMOGLOBIN: 14.3 g/dL (ref 12.0–15.0)
LYMPHS ABS: 1.6 10*3/uL (ref 0.7–4.0)
LYMPHS PCT: 16 % (ref 12.0–46.0)
MCHC: 32.9 g/dL (ref 30.0–36.0)
MCV: 93.8 fl (ref 78.0–100.0)
MONOS PCT: 8 % (ref 3.0–12.0)
Monocytes Absolute: 0.8 10*3/uL (ref 0.1–1.0)
Neutro Abs: 7.3 10*3/uL (ref 1.4–7.7)
Neutrophils Relative %: 72.7 % (ref 43.0–77.0)
Platelets: 207 10*3/uL (ref 150.0–400.0)
RBC: 4.64 Mil/uL (ref 3.87–5.11)
RDW: 14.4 % (ref 11.5–15.5)
WBC: 10 10*3/uL (ref 4.0–10.5)

## 2017-02-23 LAB — COMPREHENSIVE METABOLIC PANEL
ALBUMIN: 4 g/dL (ref 3.5–5.2)
ALK PHOS: 59 U/L (ref 39–117)
ALT: 9 U/L (ref 0–35)
AST: 12 U/L (ref 0–37)
BILIRUBIN TOTAL: 0.9 mg/dL (ref 0.2–1.2)
BUN: 15 mg/dL (ref 6–23)
CALCIUM: 10 mg/dL (ref 8.4–10.5)
CO2: 30 mEq/L (ref 19–32)
Chloride: 101 mEq/L (ref 96–112)
Creatinine, Ser: 0.75 mg/dL (ref 0.40–1.20)
GFR: 77.67 mL/min (ref >60.00)
Glucose, Bld: 132 mg/dL — ABNORMAL HIGH (ref 70–99)
POTASSIUM: 4.1 meq/L (ref 3.5–5.1)
Sodium: 137 mEq/L (ref 135–145)
TOTAL PROTEIN: 7 g/dL (ref 6.0–8.3)

## 2017-02-23 LAB — LDL CHOLESTEROL, DIRECT: Direct LDL: 49 mg/dL

## 2017-02-23 LAB — LIPID PANEL
Cholesterol: 152 mg/dL (ref 0–200)
HDL: 34 mg/dL — AB (ref >39.00)
NonHDL: 117.94
Total CHOL/HDL Ratio: 4
Triglycerides: 203 mg/dL — ABNORMAL HIGH (ref 0.0–149.0)
VLDL: 40.6 mg/dL — ABNORMAL HIGH (ref 0.0–40.0)

## 2017-02-23 LAB — HEMOGLOBIN A1C: HEMOGLOBIN A1C: 7.3 % — AB (ref 4.6–6.5)

## 2017-02-23 LAB — TSH: TSH: 7.13 u[IU]/mL — AB (ref 0.35–4.50)

## 2017-02-24 ENCOUNTER — Other Ambulatory Visit (INDEPENDENT_AMBULATORY_CARE_PROVIDER_SITE_OTHER): Payer: Medicare Other

## 2017-02-24 DIAGNOSIS — R7989 Other specified abnormal findings of blood chemistry: Secondary | ICD-10-CM | POA: Diagnosis not present

## 2017-02-24 LAB — T4, FREE: FREE T4: 1.02 ng/dL (ref 0.60–1.60)

## 2017-03-01 ENCOUNTER — Encounter (INDEPENDENT_AMBULATORY_CARE_PROVIDER_SITE_OTHER): Payer: Medicare Other | Admitting: Ophthalmology

## 2017-03-01 DIAGNOSIS — H3509 Other intraretinal microvascular abnormalities: Secondary | ICD-10-CM | POA: Diagnosis not present

## 2017-03-02 ENCOUNTER — Ambulatory Visit: Payer: Medicare Other | Admitting: Family Medicine

## 2017-03-03 ENCOUNTER — Ambulatory Visit (INDEPENDENT_AMBULATORY_CARE_PROVIDER_SITE_OTHER): Payer: Medicare Other | Admitting: Family Medicine

## 2017-03-03 ENCOUNTER — Encounter: Payer: Self-pay | Admitting: Family Medicine

## 2017-03-03 VITALS — BP 114/62 | HR 83 | Temp 97.5°F | Ht 62.0 in | Wt 121.0 lb

## 2017-03-03 DIAGNOSIS — R7989 Other specified abnormal findings of blood chemistry: Secondary | ICD-10-CM | POA: Diagnosis not present

## 2017-03-03 DIAGNOSIS — E1159 Type 2 diabetes mellitus with other circulatory complications: Secondary | ICD-10-CM

## 2017-03-03 DIAGNOSIS — I639 Cerebral infarction, unspecified: Secondary | ICD-10-CM

## 2017-03-03 DIAGNOSIS — Z23 Encounter for immunization: Secondary | ICD-10-CM | POA: Diagnosis not present

## 2017-03-03 DIAGNOSIS — E7800 Pure hypercholesterolemia, unspecified: Secondary | ICD-10-CM

## 2017-03-03 DIAGNOSIS — I10 Essential (primary) hypertension: Secondary | ICD-10-CM

## 2017-03-03 NOTE — Progress Notes (Signed)
Subjective:    Patient ID: Lauren [name], female    DOB: 06/29/1927, 83 y.o.   MRN: [id number]  HPI  Here for f/u of chronic medical problems   Since last visit-more eye problems  Laser and shot for hemorrhage  Lots of eye drops also   Doing fine otherwise   Wt Readings from Last 3 Encounters:  03/03/17 121 lb (54.9 kg)  01/15/17 124 lb 6.4 oz (56.4 kg)  01/12/17 124 lb 4.8 oz (56.4 kg)  wt is down 3 lb - thinks she eats enough /not skipping meals  22.13 kg/m  bp is stable today  No cp or palpitations or headaches or edema  No side effects to medicines  BP Readings from Last 3 Encounters:  03/03/17 114/62  01/15/17 (!) 110/58  01/12/17 (!) 153/53    Stopped lisinopril last time  No low bp -feels ok   Diabetes Home sugar results  DM diet - very good as usual  Exercise -not as good  Symptoms A1C last  Lab Results  Component Value Date   HGBA1C 7.3 (H) 02/23/2017  diet has not changed at all  Not getting as much exercise since cva and with stroke - has started walking recently 20 min 3 days per week  Up from 6.7  No problems with medications  Last eye exam -very recent , having problems/? Dm related   Cholesterol Lab Results  Component Value Date   CHOL 152 02/23/2017   CHOL 135 08/16/2016   CHOL 138 05/13/2016   Lab Results  Component Value Date   HDL 34.00 (L) 02/23/2017   HDL 34 (L) 08/16/2016   HDL 40.70 05/13/2016   Lab Results  Component Value Date   LDLCALC 71 08/16/2016   LDLCALC 67 05/13/2016   LDLCALC 55 02/29/2016   Lab Results  Component Value Date   TRIG 203.0 (H) 02/23/2017   TRIG 152 (H) 08/16/2016   TRIG 152.0 (H) 05/13/2016   Lab Results  Component Value Date   CHOLHDL 4 02/23/2017   CHOLHDL 4.0 08/16/2016   CHOLHDL 3 05/13/2016   Lab Results  Component Value Date   LDLDIRECT 49.0 02/23/2017   LDLDIRECT 76.2 04/24/2014   LDLDIRECT 46.3 10/01/2009  simvastatin and diet   Lab Results  Component Value Date   CREATININE 0.75 02/23/2017   BUN 15 02/23/2017   NA 137 02/23/2017   K 4.1 02/23/2017   CL 101 02/23/2017   CO2 30 02/23/2017   Lab Results  Component Value Date   ALT 9 02/23/2017   AST 12 02/23/2017   ALKPHOS 59 02/23/2017   BILITOT 0.9 02/23/2017    Lab Results  Component Value Date   TSH 7.13 (H) 02/23/2017    Free T4   Patient Active Problem List   Diagnosis Date Noted  . Elevated TSH 03/04/2017  . Diastolic dysfunction 40/98/1191  . Acute CVA (cerebrovascular accident) ([place]) 08/15/2016  . HTN (hypertension) 08/15/2016  . GERD (gastroesophageal reflux disease) 08/06/2015  . Hearing loss 08/06/2015  . Estrogen deficiency 05/01/2014  . Left knee pain 09/22/2013  . Chest pain, musculoskeletal 08/05/2013  . Encounter for Medicare annual wellness exam 04/04/2013  . Colon cancer screening 04/04/2013  . History of melanoma excision 09/27/2012  . History of breast cancer, left 01/08/2011  . History of pelvic fracture 09/17/2010  . DEGENERATIVE JOINT DISEASE 02/13/2010  . Migraine variant 11/06/2009  . Osteopenia 10/18/2008  . Transient ischemic attack 09/10/2008  . Diabetes type 2, controlled (  [place]) 10/06/2007  . VENOUS INSUFFICIENCY 10/06/2007  . Diverticulosis of colon 10/06/2007  . FIBROCYSTIC BREAST DISEASE 10/06/2007  . HYPERCHOLESTEROLEMIA, PURE 11/29/2006   Past Medical History:  Diagnosis Date  . Breast cancer ([place])    left breast  . Chronic constipation   . Chronic headaches   . Diabetes mellitus   . Fibrocystic breast   . Gastritis   . Gout    injection in the past  . Hyperlipidemia   . Melanoma in situ of left upper extremity ([place])   . Pelvic fracture ([place])   . TIA (transient ischemic attack) 08/2008   neg MRI/MRA and CT and carotid dopplers  . Venous insufficiency    Past Surgical History:  Procedure Laterality Date  . APPENDECTOMY    . ARM SKIN LESION BIOPSY / EXCISION Right 11/02/2016  . BREAST BIOPSY Left 10/18/2007  . BREAST CYST  ASPIRATION    . BREAST EXCISIONAL BIOPSY Right    x2  . BREAST LUMPECTOMY  09/2007   with sentinel LN biopsy  . BREAST LUMPECTOMY     left  . EYE SURGERY     retinal, then cataract   Social History  Substance Use Topics  . Smoking status: Never Smoker  . Smokeless tobacco: Never Used  . Alcohol use No   Family History  Problem Relation Age of Onset  . Breast cancer Sister   . Stroke Sister   . Cancer Sister        breast  . Breast cancer Sister   . Stroke Sister   . Cancer Sister        breast  . Stroke Mother   . Stroke Father   . Stroke Brother   . Cancer Brother        prostate  . Stroke Brother   . Stroke Sister   . CAD Sister    Allergies  Allergen Reactions  . Sulfonamide Derivatives Itching  . Aspirin Other (See Comments)    Burning and pain in stomach   Current Outpatient Prescriptions on File Prior to Visit  Medication Sig Dispense Refill  . acetaminophen (TYLENOL) 325 MG tablet Take 2 tablets (650 mg total) by mouth every 6 (six) hours as needed for mild pain (or Fever >/= 101).    . calcium carbonate (TUMS - DOSED IN MG ELEMENTAL CALCIUM) 500 MG chewable tablet Chew 1 tablet by mouth as needed for heartburn.     . citalopram (CELEXA) 10 MG tablet Take 0.5 tablets (5 mg total) by mouth daily. 15 tablet 11  . clopidogrel (PLAVIX) 75 MG tablet TAKE ONE TABLET BY MOUTH EVERY MORNING WITH BREAKFAST. 30 tablet 11  . glucose blood (ONE TOUCH ULTRA TEST) test strip USE AS DIRECTED TO CHECK BLOOD SUGAR ONCE A DAY AND AS NEEDED (DX. E11.9) 100 each 1  . metFORMIN (GLUCOPHAGE) 500 MG tablet Take 0.5 tablets (250 mg total) by mouth 2 (two) times daily with a meal. 60 tablet 11  . Multiple Vitamins-Minerals (PRESERVISION AREDS 2 PO) Take 1 tablet by mouth 2 (two) times daily.    . pantoprazole (PROTONIX) 40 MG tablet Take 1 tablet (40 mg total) by mouth daily. 30 tablet 11  . polyethylene glycol powder (GLYCOLAX/MIRALAX) powder DISSOLVE ONE (1) CAPFUL (17GM) INTO 4  TO8 OUNCES OF FLUID AND TAKE BYMOUTH ONCE DAILY AS DIRECTED 527 g 0  . ranitidine (ZANTAC) 300 MG tablet Take 1 tablet (300 mg total) by mouth daily as needed for heartburn. 30 tablet 11  .  simethicone (MYLICON) 80 MG chewable tablet Chew 80 mg by mouth every 6 (six) hours as needed for flatulence.    . simvastatin (ZOCOR) 20 MG tablet Take 1 tablet (20 mg total) by mouth at bedtime. 30 tablet 11   No current facility-administered medications on file prior to visit.      Review of Systems  Constitutional: Positive for activity change. Negative for appetite change, fatigue, fever and unexpected weight change.  HENT: Negative for congestion, ear pain, rhinorrhea, sinus pressure and sore throat.   Eyes: Negative for pain, redness and visual disturbance.  Respiratory: Negative for cough, shortness of breath and wheezing.   Cardiovascular: Negative for chest pain and palpitations.  Gastrointestinal: Negative for abdominal pain, blood in stool, constipation and diarrhea.  Endocrine: Negative for polydipsia and polyuria.  Genitourinary: Negative for dysuria, frequency and urgency.  Musculoskeletal: Negative for arthralgias, back pain and myalgias.  Skin: Negative for pallor and rash.  Allergic/Immunologic: Negative for environmental allergies.  Neurological: Positive for weakness. Negative for dizziness, seizures, syncope, light-headedness, numbness and headaches.       Pos for slow speech that is improving   Hematological: Negative for adenopathy. Does not bruise/bleed easily.  Psychiatric/Behavioral: Negative for decreased concentration and dysphoric mood. The patient is not nervous/anxious.        Objective:   Physical Exam  Constitutional: She appears well-developed and well-nourished. No distress.  Well appearing elderly female with slow speech  HENT:  Head: Normocephalic and atraumatic.  Mouth/Throat: Oropharynx is clear and moist.  Eyes: Pupils are equal, round, and reactive to  light. Conjunctivae and EOM are normal.  Neck: Normal range of motion. Neck supple. No JVD present. Carotid bruit is not present. No thyromegaly present.  Cardiovascular: Normal rate, regular rhythm, normal heart sounds and intact distal pulses.  Exam reveals no gallop.   Pulmonary/Chest: Effort normal and breath sounds normal. No respiratory distress. She has no wheezes. She has no rales.  No crackles  Abdominal: Soft. Bowel sounds are normal. She exhibits no distension and no abdominal bruit. There is no tenderness. There is no rebound.  Mildly tender in both LQ  (often baseline for her)    Musculoskeletal: She exhibits no edema.  Lymphadenopathy:    She has no cervical adenopathy.  Neurological: She is alert. She has normal reflexes.  Skin: Skin is warm and dry. No rash noted. No pallor.  Psychiatric: She has a normal mood and affect.          Assessment & Plan:   Problem List Items Addressed This Visit      Cardiovascular and Mediastinum   HTN (hypertension) - Primary    bp in fair control at this time  BP Readings from Last 1 Encounters:  03/03/17 114/62   No changes needed Disc lifstyle change with low sodium diet and exercise  Labs reviewed         Endocrine   Diabetes type 2, controlled ([place])    A1C is up - poss due to less physical exercise  Lab Results  Component Value Date   HGBA1C 7.3 (H) 02/23/2017   Will watch this  Do not want to inc risk of hypoglycemia with her age and recent cva  No change  Re check 6 mo  Rev eye and foot care         Other   Elevated TSH    Lab Results  Component Value Date   TSH 7.13 (H) 02/23/2017   Free T4 is normal  No clinical changes  Will continue to watch- expect her to become hypothyroid in the future       HYPERCHOLESTEROLEMIA, PURE    Disc goals for lipids and reasons to control them Rev labs with pt Rev low sat fat diet in detail Good diet and simvastatin         Other Visit Diagnoses    Need for  influenza vaccination       Relevant Orders   Flu Vaccine QUAD 6+ mos PF IM (Fluarix Quad PF) (Completed)

## 2017-03-03 NOTE — Patient Instructions (Addendum)
Flu shot today Blood sugar is up a bit- we will watch that  You may become hypothyroid in the future -we will watch that too   Gradually increase your walking or other exercise as tolerated   Follow up in 6 months with lab prior

## 2017-03-04 DIAGNOSIS — E039 Hypothyroidism, unspecified: Secondary | ICD-10-CM | POA: Insufficient documentation

## 2017-03-04 NOTE — Assessment & Plan Note (Signed)
bp in fair control at this time  BP Readings from Last 1 Encounters:  03/03/17 114/62   No changes needed Disc lifstyle change with low sodium diet and exercise  Labs reviewed

## 2017-03-04 NOTE — Assessment & Plan Note (Signed)
Disc goals for lipids and reasons to control them Rev labs with pt Rev low sat fat diet in detail Good diet and simvastatin

## 2017-03-04 NOTE — Assessment & Plan Note (Signed)
A1C is up - poss due to less physical exercise  Lab Results  Component Value Date   HGBA1C 7.3 (H) 02/23/2017   Will watch this  Do not want to inc risk of hypoglycemia with her age and recent cva  No change  Re check 6 mo  Rev eye and foot care

## 2017-03-04 NOTE — Assessment & Plan Note (Signed)
Lab Results  Component Value Date   TSH 7.13 (H) 02/23/2017   Free T4 is normal  No clinical changes  Will continue to watch- expect her to become hypothyroid in the future

## 2017-03-15 ENCOUNTER — Encounter (INDEPENDENT_AMBULATORY_CARE_PROVIDER_SITE_OTHER): Payer: Medicare Other | Admitting: Ophthalmology

## 2017-03-15 DIAGNOSIS — H43812 Vitreous degeneration, left eye: Secondary | ICD-10-CM | POA: Diagnosis not present

## 2017-03-15 DIAGNOSIS — E113292 Type 2 diabetes mellitus with mild nonproliferative diabetic retinopathy without macular edema, left eye: Secondary | ICD-10-CM

## 2017-03-15 DIAGNOSIS — E11311 Type 2 diabetes mellitus with unspecified diabetic retinopathy with macular edema: Secondary | ICD-10-CM | POA: Diagnosis not present

## 2017-03-15 DIAGNOSIS — H353122 Nonexudative age-related macular degeneration, left eye, intermediate dry stage: Secondary | ICD-10-CM | POA: Diagnosis not present

## 2017-03-15 DIAGNOSIS — H3509 Other intraretinal microvascular abnormalities: Secondary | ICD-10-CM

## 2017-03-15 DIAGNOSIS — E113311 Type 2 diabetes mellitus with moderate nonproliferative diabetic retinopathy with macular edema, right eye: Secondary | ICD-10-CM

## 2017-03-15 LAB — HM DIABETES EYE EXAM

## 2017-03-22 ENCOUNTER — Other Ambulatory Visit: Payer: Self-pay | Admitting: Family Medicine

## 2017-03-31 ENCOUNTER — Encounter: Payer: Self-pay | Admitting: Family Medicine

## 2017-03-31 ENCOUNTER — Ambulatory Visit (INDEPENDENT_AMBULATORY_CARE_PROVIDER_SITE_OTHER): Payer: Medicare Other | Admitting: Ophthalmology

## 2017-03-31 DIAGNOSIS — Z85820 Personal history of malignant melanoma of skin: Secondary | ICD-10-CM | POA: Diagnosis not present

## 2017-03-31 DIAGNOSIS — L989 Disorder of the skin and subcutaneous tissue, unspecified: Secondary | ICD-10-CM | POA: Diagnosis not present

## 2017-03-31 DIAGNOSIS — L814 Other melanin hyperpigmentation: Secondary | ICD-10-CM | POA: Diagnosis not present

## 2017-03-31 DIAGNOSIS — D485 Neoplasm of uncertain behavior of skin: Secondary | ICD-10-CM | POA: Diagnosis not present

## 2017-03-31 DIAGNOSIS — D225 Melanocytic nevi of trunk: Secondary | ICD-10-CM | POA: Diagnosis not present

## 2017-03-31 DIAGNOSIS — L821 Other seborrheic keratosis: Secondary | ICD-10-CM | POA: Diagnosis not present

## 2017-03-31 DIAGNOSIS — L918 Other hypertrophic disorders of the skin: Secondary | ICD-10-CM | POA: Diagnosis not present

## 2017-03-31 DIAGNOSIS — Z85828 Personal history of other malignant neoplasm of skin: Secondary | ICD-10-CM | POA: Diagnosis not present

## 2017-04-07 DIAGNOSIS — E113292 Type 2 diabetes mellitus with mild nonproliferative diabetic retinopathy without macular edema, left eye: Secondary | ICD-10-CM | POA: Diagnosis not present

## 2017-04-07 DIAGNOSIS — H04123 Dry eye syndrome of bilateral lacrimal glands: Secondary | ICD-10-CM | POA: Diagnosis not present

## 2017-04-07 DIAGNOSIS — E113211 Type 2 diabetes mellitus with mild nonproliferative diabetic retinopathy with macular edema, right eye: Secondary | ICD-10-CM | POA: Diagnosis not present

## 2017-04-07 DIAGNOSIS — H52203 Unspecified astigmatism, bilateral: Secondary | ICD-10-CM | POA: Diagnosis not present

## 2017-04-07 LAB — HM DIABETES EYE EXAM

## 2017-04-09 ENCOUNTER — Telehealth: Payer: Self-pay | Admitting: Family Medicine

## 2017-04-09 NOTE — Telephone Encounter (Signed)
Most dentists do not require holding blood thinner for an extraction - she can check with her dentist   As for the foot surgery- she needs to check with her surgeon to see what they recommend - let me know   Please let her know I am out on surgical leave currently

## 2017-04-09 NOTE — Telephone Encounter (Signed)
See attached

## 2017-04-09 NOTE — Telephone Encounter (Signed)
Pt is needing to talk with Dr. Glori Bickers regarding how long to stop blood thinner since she is having a tooth pulled and foot surgery   Best number [phone]

## 2017-04-09 NOTE — Telephone Encounter (Signed)
Pt is needing to talk with Dr. Glori Bickers regarding how long to stop blood thinner since she is having a tooth pulled and foot surgery   Best number [phone]

## 2017-04-12 ENCOUNTER — Encounter (INDEPENDENT_AMBULATORY_CARE_PROVIDER_SITE_OTHER): Payer: Medicare Other | Admitting: Ophthalmology

## 2017-04-12 ENCOUNTER — Encounter: Payer: Self-pay | Admitting: Family Medicine

## 2017-04-12 DIAGNOSIS — H353121 Nonexudative age-related macular degeneration, left eye, early dry stage: Secondary | ICD-10-CM

## 2017-04-12 DIAGNOSIS — I10 Essential (primary) hypertension: Secondary | ICD-10-CM

## 2017-04-12 DIAGNOSIS — E113311 Type 2 diabetes mellitus with moderate nonproliferative diabetic retinopathy with macular edema, right eye: Secondary | ICD-10-CM

## 2017-04-12 DIAGNOSIS — H348312 Tributary (branch) retinal vein occlusion, right eye, stable: Secondary | ICD-10-CM | POA: Diagnosis not present

## 2017-04-12 DIAGNOSIS — H43813 Vitreous degeneration, bilateral: Secondary | ICD-10-CM | POA: Diagnosis not present

## 2017-04-12 DIAGNOSIS — E11311 Type 2 diabetes mellitus with unspecified diabetic retinopathy with macular edema: Secondary | ICD-10-CM

## 2017-04-12 DIAGNOSIS — E113292 Type 2 diabetes mellitus with mild nonproliferative diabetic retinopathy without macular edema, left eye: Secondary | ICD-10-CM

## 2017-04-12 DIAGNOSIS — H35033 Hypertensive retinopathy, bilateral: Secondary | ICD-10-CM

## 2017-04-12 NOTE — Telephone Encounter (Signed)
Left voicemail requesting pt to call the office back (CRM Created)

## 2017-04-16 ENCOUNTER — Encounter: Payer: Self-pay | Admitting: Family Medicine

## 2017-04-16 ENCOUNTER — Ambulatory Visit (INDEPENDENT_AMBULATORY_CARE_PROVIDER_SITE_OTHER): Payer: Medicare Other | Admitting: Family Medicine

## 2017-04-16 VITALS — BP 100/52 | Temp 97.8°F | Ht 62.0 in | Wt 121.0 lb

## 2017-04-16 DIAGNOSIS — Z8673 Personal history of transient ischemic attack (TIA), and cerebral infarction without residual deficits: Secondary | ICD-10-CM

## 2017-04-16 DIAGNOSIS — J40 Bronchitis, not specified as acute or chronic: Secondary | ICD-10-CM | POA: Diagnosis not present

## 2017-04-16 DIAGNOSIS — R0981 Nasal congestion: Secondary | ICD-10-CM

## 2017-04-16 LAB — POCT INFLUENZA A/B
INFLUENZA A, POC: NEGATIVE
Influenza B, POC: NEGATIVE

## 2017-04-16 MED ORDER — PROMETHAZINE-DM 6.25-15 MG/5ML PO SYRP: 5.0000 mL | ORAL_SOLUTION | Freq: Four times a day (QID) | ORAL | 0 refills | Status: DC | PRN

## 2017-04-16 MED ORDER — AZITHROMYCIN 250 MG PO TABS: ORAL_TABLET | ORAL | 0 refills | Status: DC

## 2017-04-16 NOTE — Progress Notes (Signed)
Patient ID: Lauren [name], female    DOB: 1927-12-09  Age: 83 y.o. MRN: [id number]    Subjective:  Subjective  HPI  Lauren [name] presents for fever 101, cough, congestion x 2 days .  Caretaker is with her.  Pt has been taking an antihistamine, flonase with little relief.    Review of Systems  Constitutional: Positive for chills and fever.  HENT: Positive for congestion, postnasal drip, rhinorrhea and sinus pressure.   Respiratory: Positive for cough, chest tightness, shortness of breath and wheezing.   Cardiovascular: Negative for chest pain, palpitations and leg swelling.  Allergic/Immunologic: Negative for environmental allergies.    History Past Medical History:  Diagnosis Date  . Breast cancer ([place])    left breast  . Chronic constipation   . Chronic headaches   . Diabetes mellitus   . Fibrocystic breast   . Gastritis   . Gout    injection in the past  . Hyperlipidemia   . Melanoma in situ of left upper extremity ([place])   . Pelvic fracture ([place])   . TIA (transient ischemic attack) 08/2008   neg MRI/MRA and CT and carotid dopplers  . Venous insufficiency     She has a past surgical history that includes Breast cyst aspiration; Eye surgery; Appendectomy; Arm skin lesion biopsy / excision (Right, 11/02/2016); Breast lumpectomy (09/2007); Breast lumpectomy; Breast biopsy (Left, 10/18/2007); and Breast excisional biopsy (Right).   Her family history includes Breast cancer in her sister and sister; CAD in her sister; Cancer in her brother, sister, and sister; Stroke in her brother, brother, father, mother, sister, sister, and sister.She reports that  has never smoked. she has never used smokeless tobacco. She reports that she does not drink alcohol or use drugs.  Current Outpatient Medications on File Prior to Visit  Medication Sig Dispense Refill  . acetaminophen (TYLENOL) 325 MG tablet Take 2 tablets (650 mg total) by mouth every 6 (six) hours as needed for mild pain (or  Fever >/= 101).    . calcium carbonate (TUMS - DOSED IN MG ELEMENTAL CALCIUM) 500 MG chewable tablet Chew 1 tablet by mouth as needed for heartburn.     . citalopram (CELEXA) 10 MG tablet Take 0.5 tablets (5 mg total) by mouth daily. 15 tablet 11  . clopidogrel (PLAVIX) 75 MG tablet TAKE ONE TABLET BY MOUTH EVERY MORNING WITH BREAKFAST. 30 tablet 11  . fluticasone (FLONASE) 50 MCG/ACT nasal spray Place into both nostrils daily.    Marland Kitchen glucose blood (ONE TOUCH ULTRA TEST) test strip USE AS DIRECTED TO CHECK BLOOD SUGAR ONCE A DAY AND AS NEEDED (DX. E11.9) 100 each 1  . loratadine (CLARITIN) 10 MG tablet Take 10 mg by mouth daily.    . metFORMIN (GLUCOPHAGE) 500 MG tablet Take 0.5 tablets (250 mg total) by mouth 2 (two) times daily with a meal. 60 tablet 11  . Multiple Vitamins-Minerals (PRESERVISION AREDS 2 PO) Take 1 tablet by mouth 2 (two) times daily.    . pantoprazole (PROTONIX) 40 MG tablet Take 1 tablet (40 mg total) by mouth daily. 30 tablet 11  . polyethylene glycol powder (GLYCOLAX/MIRALAX) powder DISSOLVE ONE (1) CAPFUL (17GM) INTO 4 TO8 OUNCES OF FLUID AND TAKE BYMOUTH ONCE DAILY AS DIRECTED 527 g 1  . ranitidine (ZANTAC) 300 MG tablet Take 1 tablet (300 mg total) by mouth daily as needed for heartburn. 30 tablet 11  . simethicone (MYLICON) 80 MG chewable tablet Chew 80 mg by mouth every 6 (six)  hours as needed for flatulence.    . simvastatin (ZOCOR) 20 MG tablet Take 1 tablet (20 mg total) by mouth at bedtime. 30 tablet 11   No current facility-administered medications on file prior to visit.      Objective:  Objective  Physical Exam  Constitutional: She is oriented to person, place, and time. She appears well-developed and well-nourished.  HENT:  Right Ear: External ear normal.  Left Ear: External ear normal.  + PND + errythema  Eyes: Conjunctivae are normal. Right eye exhibits no discharge. Left eye exhibits no discharge.  Cardiovascular: Normal rate, regular rhythm and normal  heart sounds.  No murmur heard. Pulmonary/Chest: Effort normal. No respiratory distress. She has no wheezes. She has rhonchi. She has no rales.     She exhibits no tenderness.  Musculoskeletal: She exhibits no edema.  Lymphadenopathy:    She has cervical adenopathy.  Neurological: She is alert and oriented to person, place, and time.  Nursing note and vitals reviewed.  BP (!) 100/52   Temp 97.8 F (36.6 C) (Oral)   Ht 5\' 2"  (1.575 m)   Wt 121 lb (54.9 kg)   BMI 22.13 kg/m  Wt Readings from Last 3 Encounters:  04/16/17 121 lb (54.9 kg)  03/03/17 121 lb (54.9 kg)  01/15/17 124 lb 6.4 oz (56.4 kg)     Lab Results  Component Value Date   WBC 10.0 02/23/2017   HGB 14.3 02/23/2017   HCT 43.5 02/23/2017   PLT 207.0 02/23/2017   GLUCOSE 132 (H) 02/23/2017   CHOL 152 02/23/2017   TRIG 203.0 (H) 02/23/2017   HDL 34.00 (L) 02/23/2017   LDLDIRECT 49.0 02/23/2017   LDLCALC 71 08/16/2016   ALT 9 02/23/2017   AST 12 02/23/2017   NA 137 02/23/2017   K 4.1 02/23/2017   CL 101 02/23/2017   CREATININE 0.75 02/23/2017   BUN 15 02/23/2017   CO2 30 02/23/2017   TSH 7.13 (H) 02/23/2017   INR 1.05 08/15/2016   HGBA1C 7.3 (H) 02/23/2017   MICROALBUR 0.9 11/03/2016    Mm Screening Breast Tomo Bilateral  Result Date: 01/06/2017 CLINICAL DATA:  Screening. EXAM: 2D DIGITAL SCREENING BILATERAL MAMMOGRAM WITH CAD AND ADJUNCT TOMO COMPARISON:  Previous exam(s). ACR Breast Density Category c: The breast tissue is heterogeneously dense, which may obscure small masses. FINDINGS: There are no findings suspicious for malignancy. Images were processed with CAD. IMPRESSION: No mammographic evidence of malignancy. A result letter of this screening mammogram will be mailed directly to the patient. RECOMMENDATION: Screening mammogram in one year. (Code:[id number]-B-01Y) BI-RADS CATEGORY  1: Negative. Electronically Signed   By: Lauren [name] M.D.   On: 01/06/2017 11:36     Assessment & Plan:  Plan  I am  having Lauren Marion. [name] start on azithromycin and promethazine-dextromethorphan. I am also having her maintain her Multiple Vitamins-Minerals (PRESERVISION AREDS 2 PO), glucose blood, simethicone, calcium carbonate, acetaminophen, pantoprazole, citalopram, clopidogrel, metFORMIN, ranitidine, simvastatin, polyethylene glycol powder, loratadine, and fluticasone.  Meds ordered this encounter  Medications  . azithromycin (ZITHROMAX Z-PAK) 250 MG tablet    Sig: As directed    Dispense:  6 each    Refill:  0  . promethazine-dextromethorphan (PROMETHAZINE-DM) 6.25-15 MG/5ML syrup    Sig: Take 5 mLs by mouth 4 (four) times daily as needed.    Dispense:  118 mL    Refill:  0    Problem List Items Addressed This Visit    None    Visit Diagnoses  Nasal congestion    -  Primary   Relevant Orders   POCT Influenza A/B (Completed)   Bronchitis       Relevant Medications   azithromycin (ZITHROMAX Z-PAK) 250 MG tablet   promethazine-dextromethorphan (PROMETHAZINE-DM) 6.25-15 MG/5ML syrup    f/u next week if no better Follow-up: Return if symptoms worsen or fail to improve.  Ann Held, DO

## 2017-04-16 NOTE — Patient Instructions (Signed)

## 2017-04-19 ENCOUNTER — Telehealth: Payer: Self-pay | Admitting: Family Medicine

## 2017-04-19 NOTE — Telephone Encounter (Signed)
Patient was seen last Friday and treated with antibiotic. She is on last antibiotic tomorrow. Patient still has cough and temperature is 99.0. Niece is calling to see if patient should have follow up on Friday- she is concerned about patient relapsing. (She has transportation that day)  [name]- [phone] or cell [phone]

## 2017-04-19 NOTE — Telephone Encounter (Signed)
Spoke to patient's niece and scheduled follow-up appointment Friday with Tor Netters NP. [name] (on [place]) stated that she will call back and cancel the appointment if patient feels better.

## 2017-04-19 NOTE — Telephone Encounter (Signed)
That is fine 

## 2017-04-20 ENCOUNTER — Ambulatory Visit (INDEPENDENT_AMBULATORY_CARE_PROVIDER_SITE_OTHER): Payer: Medicare Other | Admitting: Family Medicine

## 2017-04-20 ENCOUNTER — Encounter: Payer: Self-pay | Admitting: Family Medicine

## 2017-04-20 ENCOUNTER — Ambulatory Visit: Payer: Medicare Other | Admitting: Internal Medicine

## 2017-04-20 ENCOUNTER — Ambulatory Visit (INDEPENDENT_AMBULATORY_CARE_PROVIDER_SITE_OTHER)
Admission: RE | Admit: 2017-04-20 | Discharge: 2017-04-20 | Disposition: A | Discharge status: Home / Self Care | Payer: Medicare Other | Source: Ambulatory Visit | Attending: Family Medicine | Admitting: Family Medicine

## 2017-04-20 ENCOUNTER — Ambulatory Visit: Payer: Self-pay | Admitting: *Deleted

## 2017-04-20 VITALS — BP 118/64 | HR 84 | Temp 97.9°F | Wt 122.0 lb

## 2017-04-20 DIAGNOSIS — R05 Cough: Secondary | ICD-10-CM | POA: Diagnosis not present

## 2017-04-20 DIAGNOSIS — J209 Acute bronchitis, unspecified: Secondary | ICD-10-CM

## 2017-04-20 DIAGNOSIS — Z8673 Personal history of transient ischemic attack (TIA), and cerebral infarction without residual deficits: Secondary | ICD-10-CM | POA: Diagnosis not present

## 2017-04-20 NOTE — Telephone Encounter (Addendum)
Started on antibiotics Friday 11/23; temperature 99.7 degrees using tympanic thermometer in both ears; pt instructed to call back if not getting better; pt also has appointment scheduled for Friday 04/13/17 but would like to be seen sooner; caregiver is concerned about pt's age and condition;; pt offered appointment for 04/20/17 at 1230 with Webb Silversmith; Caregiver verbalizes understanding and accepts appointment; contacted Rena at [facility] regarding this Reason for Disposition . [1] Fever returns after gone for over 24 hours AND [2] symptoms worse or not improved  Answer Assessment - Initial Assessment Questions 1. ONSET: "When did the cough begin?"      04/16/17 2. SEVERITY: "How bad is the cough today?"      severe 3. RESPIRATORY DISTRESS: "Describe your breathing."      Congestion, wheezing 4. FEVER: "Do you have a fever?" If so, ask: "What is your temperature, how was it measured, and when did it start?"     No temp 99.7 using tympanic thermometer5. HEMOPTYSIS: "Are you coughing up any blood?" If so ask: "How much?" (flecks, streaks, tablespoons, etc.)     no 6. TREATMENT: "What have you done so far to treat the cough?" (e.g., meds, fluids, humidifier)     Cough meds and antibiotics  7. CARDIAC HISTORY: "Do you have any history of heart disease?" (e.g., heart attack, congestive heart failure)      no 8. LUNG HISTORY: "Do you have any history of lung disease?"  (e.g., pulmonary embolus, asthma, emphysema)     no 9. PE RISK FACTORS: "Do you have a history of blood clots?" (or: recent major surgery, recent prolonged travel, bedridden )     no 10. OTHER SYMPTOMS: "Do you have any other symptoms? (e.g., runny nose, wheezing, chest pain)       wheezing 11. PREGNANCY: "Is there any chance you are pregnant?" "When was your last menstrual period?"       no 12. TRAVEL: "Have you traveled out of the country in the last month?" (e.g., travel history, exposures)       no  Protocols used:  COUGH - ACUTE NON-PRODUCTIVE-A-AH

## 2017-04-20 NOTE — Progress Notes (Signed)
BP 118/64 (BP Location: Left Arm, Patient Position: Sitting, Cuff Size: Normal)   Pulse 84   Temp 97.9 F (36.6 C) (Oral)   Wt 122 lb (55.3 kg)   SpO2 96%   BMI 22.31 kg/m    CC: f/u bronchitis Subjective:    Patient ID: Lauren [name], female    DOB: [birth year], 83 y.o.   MRN: [id number]  HPI: Lauren [name] is a 82 y.o. female presenting on 04/20/2017 for Bronchitis (follow-up. Still taking abx. Cough not improving and was told she may now have pneumonia)   Here with CNA caregiver.   See recent note for details. Seen by Dr Etter Sjogren at [place] on Friday with ST, fever, cough rhinorrhea and congestion, found to have bronchitis treated with azithromycin and phenergan cough syrup. Fever has since improved. Cough progressively worsening - trouble expectorating. Some dyspnea and wheezing.   Fever has resolved.  She is also taking cold ease lozenges and flonase.  No sick contacts at home. No smokers at home. No h/o asthma.   Relevant past medical, surgical, family and social history reviewed and updated as indicated. Interim medical history since our last visit reviewed. Allergies and medications reviewed and updated. Outpatient Medications Prior to Visit  Medication Sig Dispense Refill  . acetaminophen (TYLENOL) 325 MG tablet Take 2 tablets (650 mg total) by mouth every 6 (six) hours as needed for mild pain (or Fever >/= 101).    Marland Kitchen Besifloxacin HCl (BESIVANCE) 0.6 % SUSP Apply 1 drop to eye 4 (four) times daily. PLACE 1 DROP INTO THE RIGHT EYE FOUR TIMES A DAY FOR 2 DAYS FOLLOWING EACH EYE    . calcium carbonate (TUMS - DOSED IN MG ELEMENTAL CALCIUM) 500 MG chewable tablet Chew 1 tablet by mouth as needed for heartburn.     . citalopram (CELEXA) 10 MG tablet Take 0.5 tablets (5 mg total) by mouth daily. 15 tablet 11  . clopidogrel (PLAVIX) 75 MG tablet TAKE ONE TABLET BY MOUTH EVERY MORNING WITH BREAKFAST. 30 tablet 11  . fluticasone (FLONASE) 50 MCG/ACT nasal spray Place into both  nostrils daily.    Marland Kitchen glucose blood (ONE TOUCH ULTRA TEST) test strip USE AS DIRECTED TO CHECK BLOOD SUGAR ONCE A DAY AND AS NEEDED (DX. E11.9) 100 each 1  . loratadine (CLARITIN) 10 MG tablet Take 10 mg by mouth daily.    . metFORMIN (GLUCOPHAGE) 500 MG tablet Take 0.5 tablets (250 mg total) by mouth 2 (two) times daily with a meal. 60 tablet 11  . Multiple Vitamins-Minerals (PRESERVISION AREDS 2 PO) Take 1 tablet by mouth 2 (two) times daily.    . pantoprazole (PROTONIX) 40 MG tablet Take 1 tablet (40 mg total) by mouth daily. 30 tablet 11  . polyethylene glycol powder (GLYCOLAX/MIRALAX) powder DISSOLVE ONE (1) CAPFUL (17GM) INTO 4 TO8 OUNCES OF FLUID AND TAKE BYMOUTH ONCE DAILY AS DIRECTED 527 g 1  . promethazine-dextromethorphan (PROMETHAZINE-DM) 6.25-15 MG/5ML syrup Take 5 mLs by mouth 4 (four) times daily as needed. 118 mL 0  . ranitidine (ZANTAC) 300 MG tablet Take 1 tablet (300 mg total) by mouth daily as needed for heartburn. 30 tablet 11  . simethicone (MYLICON) 80 MG chewable tablet Chew 80 mg by mouth every 6 (six) hours as needed for flatulence.    . simvastatin (ZOCOR) 20 MG tablet Take 1 tablet (20 mg total) by mouth at bedtime. 30 tablet 11  . azithromycin (ZITHROMAX Z-PAK) 250 MG tablet As directed 6 each 0  No facility-administered medications prior to visit.      Per HPI unless specifically indicated in ROS section below Review of Systems     Objective:    BP 118/64 (BP Location: Left Arm, Patient Position: Sitting, Cuff Size: Normal)   Pulse 84   Temp 97.9 F (36.6 C) (Oral)   Wt 122 lb (55.3 kg)   SpO2 96%   BMI 22.31 kg/m   Wt Readings from Last 3 Encounters:  04/20/17 122 lb (55.3 kg)  04/16/17 121 lb (54.9 kg)  03/03/17 121 lb (54.9 kg)    Physical Exam  Constitutional: She appears well-developed and well-nourished. No distress.  HENT:  Head: Normocephalic and atraumatic.  Right Ear: Hearing, tympanic membrane, external ear and ear canal normal.  Left  Ear: Hearing, tympanic membrane, external ear and ear canal normal.  Nose: Mucosal edema (mucosal erythema) and rhinorrhea present. Right sinus exhibits no maxillary sinus tenderness and no frontal sinus tenderness. Left sinus exhibits no maxillary sinus tenderness and no frontal sinus tenderness.  Mouth/Throat: Uvula is midline and mucous membranes are normal. Posterior oropharyngeal erythema present. No oropharyngeal exudate, posterior oropharyngeal edema or tonsillar abscesses.  Eyes: Conjunctivae and EOM are normal. Pupils are equal, round, and reactive to light. No scleral icterus.  Neck: Normal range of motion. Neck supple.  Cardiovascular: Normal rate, regular rhythm, normal heart sounds and intact distal pulses.  No murmur heard. Pulmonary/Chest: Effort normal and breath sounds normal. No respiratory distress. She has no decreased breath sounds. She has no wheezes. She has no rhonchi. She has no rales.  Hacking cough present Crackles bibasilarly  Lymphadenopathy:    She has no cervical adenopathy.  Skin: Skin is warm and dry. No rash noted.  Nursing note and vitals reviewed.  Results for orders placed or performed in visit on 04/16/17  POCT Influenza A/B  Result Value Ref Range   Influenza A, POC Negative Negative   Influenza B, POC Negative Negative      Assessment & Plan:   Problem List Items Addressed This Visit    Acute bronchitis - Primary    Anticipate acute bronchitis that is slow to resolve. She has completed zpack course. No signs of ongoing bacterial infection so I didn't recommend prolonged course or broadening coverage. Supportive care reviewed. rec continue phenergan cough syrup and start plain mucinex expectorant. Red flags to seek further care reviewed. Update if not improving with treatment. She has f/u scheduled for Friday - rec keep that appt especially if not improving.       Relevant Orders   DG Chest 2 View       Follow up plan: Return if symptoms  worsen or fail to improve.  Ria Bush, MD

## 2017-04-20 NOTE — Patient Instructions (Addendum)
I think you have persistent bronchitis, but don't think there's pneumonia.  Xray today.  Continue phenergan cough syrup.  Add plain mucinex (guaifenesin or guaifenesin-dextromethorphan) with plenty of water to help mobilize mucous.  Make sure you're staying well hydrated and get plenty of rest. Let us know if fever >101, persistent productive cough that is not improving over the next several days.

## 2017-04-20 NOTE — Telephone Encounter (Signed)
Iona Coach RN also noted: Follow up visit for treatment of bronchitis on 04/16/17; scheduled with Webb Silversmith, NP 04/20/17 at 1230; [place] notified (Routing comment)

## 2017-04-20 NOTE — Assessment & Plan Note (Addendum)
Anticipate acute bronchitis that is slow to resolve. She has completed zpack course. No signs of ongoing bacterial infection so I didn't recommend prolonged course or broadening coverage. Supportive care reviewed. rec continue phenergan cough syrup and start plain mucinex expectorant. Red flags to seek further care reviewed. Update if not improving with treatment. She has f/u scheduled for Friday - rec keep that appt especially if not improving.

## 2017-04-21 ENCOUNTER — Telehealth: Payer: Self-pay | Admitting: Family Medicine

## 2017-04-21 NOTE — Telephone Encounter (Signed)
Copied from [place]. Topic: Quick Communication - See Telephone Encounter >> Apr 21, 2017  2:40 PM Bea Graff, NT wrote: CRM for notification. See Telephone encounter for: Patient would like a call with her xray results from 04/20/17.   04/21/17.

## 2017-04-22 NOTE — Telephone Encounter (Signed)
Late entry - spoke with patient last night at 7:30pm about results. See result note.

## 2017-04-23 ENCOUNTER — Ambulatory Visit: Payer: Medicare Other | Admitting: Family Medicine

## 2017-04-26 ENCOUNTER — Other Ambulatory Visit: Payer: Self-pay | Admitting: *Deleted

## 2017-04-26 MED ORDER — SIMVASTATIN 20 MG PO TABS: 20.0000 mg | ORAL_TABLET | Freq: Every day | ORAL | 1 refills | Status: DC

## 2017-04-26 NOTE — Telephone Encounter (Signed)
Received fax requesting Rx be changed to 90 day supply due to it costing equal to or less then a 30 day supply, Rx sent

## 2017-05-07 ENCOUNTER — Encounter: Payer: Self-pay | Admitting: Family Medicine

## 2017-05-07 ENCOUNTER — Ambulatory Visit: Payer: Self-pay | Admitting: *Deleted

## 2017-05-07 ENCOUNTER — Ambulatory Visit (INDEPENDENT_AMBULATORY_CARE_PROVIDER_SITE_OTHER): Payer: Medicare Other | Admitting: Family Medicine

## 2017-05-07 VITALS — BP 146/84 | HR 85 | Temp 97.9°F | Ht 62.0 in | Wt 121.0 lb

## 2017-05-07 DIAGNOSIS — J209 Acute bronchitis, unspecified: Secondary | ICD-10-CM

## 2017-05-07 DIAGNOSIS — I639 Cerebral infarction, unspecified: Secondary | ICD-10-CM | POA: Diagnosis not present

## 2017-05-07 MED ORDER — PREDNISONE 10 MG PO TABS: ORAL_TABLET | ORAL | 0 refills | Status: DC

## 2017-05-07 NOTE — Telephone Encounter (Signed)
   Reason for Disposition . [1] Continuous (nonstop) coughing interferes with work or school AND [2] no improvement using cough treatment per protocol  Answer Assessment - Initial Assessment Questions Lauren [name]'s CMA phoned to report her cough has not improving since her appointment on 11/27 with a dx of bronchitis. She has completed zithromycin and mucinex.   1. ONSET: "When did the cough begin?"      3 weeks ago 2. SEVERITY: "How bad is the cough today?"      Constant  3. RESPIRATORY DISTRESS: "Describe your breathing."     normal 4. FEVER: "Do you have a fever?" If so, ask: "What is your temperature, how was it measured, and when did it start?"     no 5. HEMOPTYSIS: "Are you coughing up any blood?" If so ask: "How much?" (flecks, streaks, tablespoons, etc.)    no 6. TREATMENT: "What have you done so far to treat the cough?" (e.g., meds, fluids, humidifier)     Delsym-doesn't help, drinking 2-3 glasses of water a day. 7. CARDIAC HISTORY: "Do you have any history of heart disease?" (e.g., heart attack, congestive heart failure) no     No 8. LUNG HISTORY: "Do you have any history of lung disease?"  (e.g., pulmonary embolus, asthma, emphysema)     no 9. PE RISK FACTORS: "Do you have a history of blood clots?" (or: recent major surgery, recent prolonged travel, bedridden )     no 10. OTHER SYMPTOMS: "Do you have any other symptoms? (e.g., runny nose, wheezing, chest pain)       none 11. PREGNANCY: "Is there any chance you are pregnant?" "When was your last menstrual period?"      na 12. TRAVEL: "Have you traveled out of the country in the last month?" (e.g., travel history, exposures)      no  Protocols used: COUGH - ACUTE NON-PRODUCTIVE-A-AH

## 2017-05-07 NOTE — Progress Notes (Signed)
Subjective:    Patient ID: Lauren [name], female    DOB: 11/09/1927, 83 y.o.   MRN: [id number]  HPI Here for cough and congestion   Pulse ox 96% on RA  BP Readings from Last 3 Encounters:  05/07/17 (!) 146/84  04/20/17 118/64  04/16/17 (!) 100/52    cxr last month with Dr Andy Gauss a z pak  Still raspy in general  Coughs a fair amt - it does not keep her from sleeping  Talking makes her cough and get sob   Was improving - then a little worse in the past 12 hours  She cannot hear wheezing  Her caregiver heard some congestion in her lungs last week   No chest pressure or pain  Had heartburn one night- that is better (does not get if often, she used to)   Never smoked  No known chemical or smoke exp    PACS Images   Show images for DG Chest 2 View  Study Result   CLINICAL DATA:  Persistent cough.  EXAM: CHEST  2 VIEW  COMPARISON:  05/25/2016.  FINDINGS: Trachea is midline. Heart is enlarged. There may be minimal right basilar subsegmental atelectasis. Lingular volume loss. Lungs are otherwise clear. No pleural fluid. Left hemidiaphragm is mildly elevated. Pectus deformity.  IMPRESSION: 1. No acute findings. 2. Lingular volume loss, likely due to scarring. If further evaluation is desired, CT chest with contrast is recommended.   Electronically Signed   By: Lorin Picket M.D.   On: 04/21/2017 08:49    Patient Active Problem List   Diagnosis Date Noted  . Elevated TSH 03/04/2017  . Diastolic dysfunction 97/35/3299  . Acute bronchitis 09/17/2016  . Acute CVA (cerebrovascular accident) ([place]) 08/15/2016  . HTN (hypertension) 08/15/2016  . GERD (gastroesophageal reflux disease) 08/06/2015  . Hearing loss 08/06/2015  . Estrogen deficiency 05/01/2014  . Left knee pain 09/22/2013  . Chest pain, musculoskeletal 08/05/2013  . Encounter for Medicare annual wellness exam 04/04/2013  . Colon cancer screening 04/04/2013  . History of melanoma  excision 09/27/2012  . History of breast cancer, left 01/08/2011  . History of pelvic fracture 09/17/2010  . DEGENERATIVE JOINT DISEASE 02/13/2010  . Migraine variant 11/06/2009  . Osteopenia 10/18/2008  . Transient ischemic attack 09/10/2008  . Diabetes type 2, controlled ([place]) 10/06/2007  . VENOUS INSUFFICIENCY 10/06/2007  . Diverticulosis of colon 10/06/2007  . FIBROCYSTIC BREAST DISEASE 10/06/2007  . HYPERCHOLESTEROLEMIA, PURE 11/29/2006   Past Medical History:  Diagnosis Date  . Breast cancer ([place])    left breast  . Chronic constipation   . Chronic headaches   . Diabetes mellitus   . Fibrocystic breast   . Gastritis   . Gout    injection in the past  . Hyperlipidemia   . Melanoma in situ of left upper extremity ([place])   . Pelvic fracture ([place])   . TIA (transient ischemic attack) 08/2008   neg MRI/MRA and CT and carotid dopplers  . Venous insufficiency    Past Surgical History:  Procedure Laterality Date  . APPENDECTOMY    . ARM SKIN LESION BIOPSY / EXCISION Right 11/02/2016  . BREAST BIOPSY Left 10/18/2007  . BREAST CYST ASPIRATION    . BREAST EXCISIONAL BIOPSY Right    x2  . BREAST LUMPECTOMY  09/2007   with sentinel LN biopsy  . BREAST LUMPECTOMY     left  . EYE SURGERY     retinal, then cataract   Social  History   Tobacco Use  . Smoking status: Never Smoker  . Smokeless tobacco: Never Used  Substance Use Topics  . Alcohol use: No    Alcohol/week: 0.0 oz  . Drug use: No   Family History  Problem Relation Age of Onset  . Breast cancer Sister   . Stroke Sister   . Cancer Sister        breast  . Breast cancer Sister   . Stroke Sister   . Cancer Sister        breast  . Stroke Mother   . Stroke Father   . Stroke Brother   . Cancer Brother        prostate  . Stroke Brother   . Stroke Sister   . CAD Sister    Allergies  Allergen Reactions  . Sulfonamide Derivatives Itching  . Aspirin Other (See Comments)    Burning and pain in stomach    Current Outpatient Medications on File Prior to Visit  Medication Sig Dispense Refill  . acetaminophen (TYLENOL) 325 MG tablet Take 2 tablets (650 mg total) by mouth every 6 (six) hours as needed for mild pain (or Fever >/= 101).    Marland Kitchen Besifloxacin HCl (BESIVANCE) 0.6 % SUSP Apply 1 drop to eye 4 (four) times daily. PLACE 1 DROP INTO THE RIGHT EYE FOUR TIMES A DAY FOR 2 DAYS FOLLOWING EACH EYE    . calcium carbonate (TUMS - DOSED IN MG ELEMENTAL CALCIUM) 500 MG chewable tablet Chew 1 tablet by mouth as needed for heartburn.     . citalopram (CELEXA) 10 MG tablet Take 0.5 tablets (5 mg total) by mouth daily. 15 tablet 11  . clopidogrel (PLAVIX) 75 MG tablet TAKE ONE TABLET BY MOUTH EVERY MORNING WITH BREAKFAST. 30 tablet 11  . fluticasone (FLONASE) 50 MCG/ACT nasal spray Place into both nostrils daily.    Marland Kitchen glucose blood (ONE TOUCH ULTRA TEST) test strip USE AS DIRECTED TO CHECK BLOOD SUGAR ONCE A DAY AND AS NEEDED (DX. E11.9) 100 each 1  . loratadine (CLARITIN) 10 MG tablet Take 10 mg by mouth daily.    . metFORMIN (GLUCOPHAGE) 500 MG tablet Take 0.5 tablets (250 mg total) by mouth 2 (two) times daily with a meal. 60 tablet 11  . Multiple Vitamins-Minerals (PRESERVISION AREDS 2 PO) Take 1 tablet by mouth 2 (two) times daily.    . pantoprazole (PROTONIX) 40 MG tablet Take 1 tablet (40 mg total) by mouth daily. 30 tablet 11  . polyethylene glycol powder (GLYCOLAX/MIRALAX) powder DISSOLVE ONE (1) CAPFUL (17GM) INTO 4 TO8 OUNCES OF FLUID AND TAKE BYMOUTH ONCE DAILY AS DIRECTED 527 g 1  . promethazine-dextromethorphan (PROMETHAZINE-DM) 6.25-15 MG/5ML syrup Take 5 mLs by mouth 4 (four) times daily as needed. 118 mL 0  . ranitidine (ZANTAC) 300 MG tablet Take 1 tablet (300 mg total) by mouth daily as needed for heartburn. 30 tablet 11  . simethicone (MYLICON) 80 MG chewable tablet Chew 80 mg by mouth every 6 (six) hours as needed for flatulence.    . simvastatin (ZOCOR) 20 MG tablet Take 1 tablet (20 mg  total) by mouth at bedtime. 90 tablet 1   No current facility-administered medications on file prior to visit.      Review of Systems  Constitutional: Negative for activity change, appetite change, fatigue, fever and unexpected weight change.  HENT: Positive for postnasal drip. Negative for congestion, ear pain, rhinorrhea, sinus pressure and sore throat.   Eyes: Negative for pain,  redness and visual disturbance.  Respiratory: Positive for cough, chest tightness and shortness of breath. Negative for stridor.   Cardiovascular: Negative for chest pain and palpitations.  Gastrointestinal: Negative for abdominal pain, blood in stool, constipation and diarrhea.  Endocrine: Negative for polydipsia and polyuria.  Genitourinary: Negative for dysuria, frequency and urgency.  Musculoskeletal: Negative for arthralgias, back pain and myalgias.  Skin: Negative for pallor and rash.  Allergic/Immunologic: Negative for environmental allergies.  Neurological: Negative for dizziness, syncope and headaches.  Hematological: Negative for adenopathy. Does not bruise/bleed easily.  Psychiatric/Behavioral: Negative for decreased concentration and dysphoric mood. The patient is not nervous/anxious.        Objective:   Physical Exam  Constitutional: She appears well-developed and well-nourished. No distress.  Frail appearing elderly female in no distress   HENT:  Head: Normocephalic and atraumatic.  Right Ear: External ear normal.  Left Ear: External ear normal.  Mouth/Throat: Oropharynx is clear and moist.  Nares are boggy No sinus tenderness Clear rhinorrhea and post nasal drip   Eyes: Conjunctivae and EOM are normal. Pupils are equal, round, and reactive to light. Right eye exhibits no discharge. Left eye exhibits no discharge.  Neck: Normal range of motion. Neck supple.  Cardiovascular: Normal rate and normal heart sounds.  Pulmonary/Chest: Effort normal and breath sounds normal. No respiratory  distress. She has no wheezes. She has no rales. She exhibits no tenderness.  Good air exch overall Harsh bs  Few scattered rhonchi/no rales  Scant wheeze on forced exp only (no prolonged exp phase0   Lymphadenopathy:    She has no cervical adenopathy.  Neurological: She is alert.  Skin: Skin is warm and dry. No rash noted. No pallor.  Psychiatric: She has a normal mood and affect.  Mildly anx baseline           Assessment & Plan:   Problem List Items Addressed This Visit      Respiratory   Acute bronchitis - Primary    This continues to linger with semi productive cough  Perhaps worse in the past day  Pt reports tight breathing and occ sob  Has already had a clear cxr and course of zpak Suspect she has reactive airways at this point tx with low dose pred taper (30)- explained dosing and side effects in detail  Follow closely as pt has h/o pneumonia  Rest/fluids/close obs Update if not starting to improve in a week or if worsening

## 2017-05-07 NOTE — Patient Instructions (Signed)
I think you still have bronchitis (with bronchospasm)  Prednisone should help open you up  Take it as directed It will make you hyper/anxious and hungry and also will make your blood sugar go up until you are done with   If you get worse or have fever or other symptoms let us know  Update if not starting to improve in a week or if worsening

## 2017-05-09 NOTE — Assessment & Plan Note (Signed)
This continues to linger with semi productive cough  Perhaps worse in the past day  Pt reports tight breathing and occ sob  Has already had a clear cxr and course of zpak Suspect she has reactive airways at this point tx with low dose pred taper (30)- explained dosing and side effects in detail  Follow closely as pt has h/o pneumonia  Rest/fluids/close obs Update if not starting to improve in a week or if worsening

## 2017-05-10 ENCOUNTER — Encounter (INDEPENDENT_AMBULATORY_CARE_PROVIDER_SITE_OTHER): Payer: Medicare Other | Admitting: Ophthalmology

## 2017-05-10 DIAGNOSIS — H35033 Hypertensive retinopathy, bilateral: Secondary | ICD-10-CM | POA: Diagnosis not present

## 2017-05-10 DIAGNOSIS — H43813 Vitreous degeneration, bilateral: Secondary | ICD-10-CM

## 2017-05-10 DIAGNOSIS — E113311 Type 2 diabetes mellitus with moderate nonproliferative diabetic retinopathy with macular edema, right eye: Secondary | ICD-10-CM

## 2017-05-10 DIAGNOSIS — E11311 Type 2 diabetes mellitus with unspecified diabetic retinopathy with macular edema: Secondary | ICD-10-CM | POA: Diagnosis not present

## 2017-05-10 DIAGNOSIS — I10 Essential (primary) hypertension: Secondary | ICD-10-CM

## 2017-05-10 DIAGNOSIS — E113292 Type 2 diabetes mellitus with mild nonproliferative diabetic retinopathy without macular edema, left eye: Secondary | ICD-10-CM | POA: Diagnosis not present

## 2017-05-28 ENCOUNTER — Ambulatory Visit: Payer: Medicare Other | Admitting: Family Medicine

## 2017-06-07 ENCOUNTER — Encounter (INDEPENDENT_AMBULATORY_CARE_PROVIDER_SITE_OTHER): Payer: Medicare Other | Admitting: Ophthalmology

## 2017-06-07 DIAGNOSIS — H353121 Nonexudative age-related macular degeneration, left eye, early dry stage: Secondary | ICD-10-CM

## 2017-06-07 DIAGNOSIS — I10 Essential (primary) hypertension: Secondary | ICD-10-CM

## 2017-06-07 DIAGNOSIS — H43813 Vitreous degeneration, bilateral: Secondary | ICD-10-CM | POA: Diagnosis not present

## 2017-06-07 DIAGNOSIS — E11311 Type 2 diabetes mellitus with unspecified diabetic retinopathy with macular edema: Secondary | ICD-10-CM

## 2017-06-07 DIAGNOSIS — H35033 Hypertensive retinopathy, bilateral: Secondary | ICD-10-CM | POA: Diagnosis not present

## 2017-06-07 DIAGNOSIS — E113311 Type 2 diabetes mellitus with moderate nonproliferative diabetic retinopathy with macular edema, right eye: Secondary | ICD-10-CM | POA: Diagnosis not present

## 2017-06-07 DIAGNOSIS — E113292 Type 2 diabetes mellitus with mild nonproliferative diabetic retinopathy without macular edema, left eye: Secondary | ICD-10-CM

## 2017-06-07 DIAGNOSIS — H3509 Other intraretinal microvascular abnormalities: Secondary | ICD-10-CM | POA: Diagnosis not present

## 2017-06-25 DIAGNOSIS — D485 Neoplasm of uncertain behavior of skin: Secondary | ICD-10-CM | POA: Diagnosis not present

## 2017-06-25 DIAGNOSIS — Z85828 Personal history of other malignant neoplasm of skin: Secondary | ICD-10-CM | POA: Diagnosis not present

## 2017-06-29 ENCOUNTER — Ambulatory Visit: Payer: Medicare Other | Admitting: Neurology

## 2017-07-19 ENCOUNTER — Encounter (INDEPENDENT_AMBULATORY_CARE_PROVIDER_SITE_OTHER): Payer: Medicare Other | Admitting: Ophthalmology

## 2017-07-19 DIAGNOSIS — E113292 Type 2 diabetes mellitus with mild nonproliferative diabetic retinopathy without macular edema, left eye: Secondary | ICD-10-CM

## 2017-07-19 DIAGNOSIS — H3509 Other intraretinal microvascular abnormalities: Secondary | ICD-10-CM

## 2017-07-19 DIAGNOSIS — E11311 Type 2 diabetes mellitus with unspecified diabetic retinopathy with macular edema: Secondary | ICD-10-CM

## 2017-07-19 DIAGNOSIS — H43812 Vitreous degeneration, left eye: Secondary | ICD-10-CM | POA: Diagnosis not present

## 2017-07-19 DIAGNOSIS — H35033 Hypertensive retinopathy, bilateral: Secondary | ICD-10-CM

## 2017-07-19 DIAGNOSIS — I10 Essential (primary) hypertension: Secondary | ICD-10-CM

## 2017-07-19 DIAGNOSIS — E113311 Type 2 diabetes mellitus with moderate nonproliferative diabetic retinopathy with macular edema, right eye: Secondary | ICD-10-CM | POA: Diagnosis not present

## 2017-07-19 LAB — HM DIABETES EYE EXAM

## 2017-08-05 ENCOUNTER — Ambulatory Visit (INDEPENDENT_AMBULATORY_CARE_PROVIDER_SITE_OTHER): Payer: Medicare Other | Admitting: Neurology

## 2017-08-05 ENCOUNTER — Encounter: Payer: Self-pay | Admitting: Neurology

## 2017-08-05 VITALS — BP 128/70 | HR 101 | Ht 62.0 in | Wt 118.5 lb

## 2017-08-05 DIAGNOSIS — I639 Cerebral infarction, unspecified: Secondary | ICD-10-CM | POA: Diagnosis not present

## 2017-08-05 DIAGNOSIS — G459 Transient cerebral ischemic attack, unspecified: Secondary | ICD-10-CM | POA: Diagnosis not present

## 2017-08-05 NOTE — Patient Instructions (Signed)
You look well! I am glad you are doing well. Please try to stay active and drink plenty of fluid, about 6 cups of water per day, change positions slowly, use your cane for safety.  I will see you back routinely in 6 months.  Congratulations on the triple addition to the [place] family!

## 2017-08-05 NOTE — Progress Notes (Signed)
Subjective:    Patient ID: Lauren [name] is a 84 y.o. female.  HPI     Interim history:   Lauren [name] is a very pleasant 84 year old right-handed woman with an underlying medical history of type 2 diabetes, melanoma, history of breast cancer, pelvic fracture, osteopenia, TIAs, venous insufficiency, diverticulosis, hyperlipidemia who presents for a follow-up consultation of her history of TIA and Hx of stroke. The patient is accompanied by her niece, Lauren [name] again today. I last saw her on 12/24/2016, at which time she had some concerns about her elevated blood pressure values. She had started a new blood pressure medication. Neurologically, she had no new symptoms.  Today, 08/05/2017 (all dictated new, as well as above notes, some dictation done in note pad or Word, outside of chart, may appear as copied):  She reports doing well, no recent new neurological issues. Thankfully, no recent falls or tripping incident. She tries to hydrate well, sometimes she walks outside. When she walks inside the house she does not use her cane always. She is alone overnight but has family check in on her during the day. She has had no recent illness. Her nephew's daughter had triplets recently.   The patient's allergies, current medications, family history, past medical history, past social history, past surgical history and problem list were reviewed and updated as appropriate.    Previously (copied from previous notes for reference):    I saw her on 09/23/2016, at which time she reported doing okay. She had recently been started on antibiotics for pneumonia and UTI. She was at [facility] for rehabilitation from 08/18/16 through 4/19. She had recuperated well from the stroke but had a setback because of acute illnesses including her UTI and pneumonia. She was trying to maintain a good spirit and eat well. She has good family support from nieces and nephews and extended family thankfully. Sadly, when she was hospitalized  for her stroke her older sister died at the age of 82 from her advanced dementia. She was advised to continue with Plavix and her other medications, and we talked about secondary stroke prevention.   I saw her on 03/19/2016 after she was hospitalized for TIA. She had full stroke workup in the hospital in October 2017. We reviewed her test results and hospital records at the time. She was stable at the time. She did have 2 interim episodes of problems concentrating and getting her words out. Her discharge diagnosis in October 2017 was TIA versus migraine variant.    She presented to the hospital on 08/15/2016 with left-sided weakness including left facial droop. She had a brain MRI which showed acute right anterior cerebral artery territory infarct, primarily involving the corpus callosum. MRA head was negative for large vessel occlusion, echocardiogram showed EF of 16-38, grade 1 diastolic dysfunction, carotid Doppler studies showed less than 40% ICA plaquing, her therapist recommended skilled nursing facility upon discharge. She was advised to continue Plavix, LDL was 71, hemoglobin A1c was 6.7. A loop recorder was recommended but patient decided against it. She was seen by cardiology.   03/19/2016: She was in the hospital on 02/28/2016 and discharged on 03/02/2016. She presented with difficulty with her speech and shakiness. She did recall that she had a similar episode in 2014. She had an EEG in 2014 which was normal in the awake state. She had an EEG on 03/02/2016 and I reviewed the results: Impression: This awake and drowsy EEG is normal. CT head without contrast was negative for any acute  abnormalities, CTA neck was negative for any significant stenosis or occlusion. She had no need for physical therapy and occupational therapy after being evaluated by OT and PT. She was set up for 30 day cardiac monitoring as an outpatient.  She did not have a HA at the time, but in 2014 she did have an associated  HA.    I reviewed the scan results:  Ct Angio Head and Neck W Or Wo Contrast 02/28/2016 1. Negative for emergent large vessel occlusion.  2. Minimal atherosclerosis in the neck without stenosis. No anterior circulation large vessel or first order vessel stenosis.  3. Moderate to severe stenosis in the left ACA pericallosal artery and bilateral MCA distal posterior M2 or M3 branches.  4. Mild to moderate stenosis at the right PCA origin is new since 2014. Moderate to severe right P2 stenoses are new or progressed since 2014.  5. Mild to moderate aortic arch calcified atherosclerosis.    Ct Head Code Stroke W/o Cm 02/28/2016 1. Stable CT appearance of chronic small vessel disease. No acute intracranial hemorrhage or acute cortically based infarct identified.  2. ASPECTS is 10.    MR MRA Head without Contrast 02/29/2016 No acute infarction. Moderate chronic small-vessel ischemic changes of the cerebral hemispheric deep white matter. No intracranial major vessel occlusion or correctable proximal stenosis. Distal vessel atherosclerotic narrowing and irregularity most notable in multiple M2 segments bilaterally and in the PCAs, right more than left.   She had a TTE on 03/02/16 (Dr. Meda Coffee interpreted): Study Conclusions   - Left ventricle: The cavity size was normal. There was mild   concentric hypertrophy. Systolic function was vigorous. The   estimated ejection fraction was in the range of 65% to 70%. Wall   motion was normal; there were no regional wall motion   abnormalities. Doppler parameters are consistent with abnormal   left ventricular relaxation (grade 1 diastolic dysfunction).   Doppler parameters are consistent with elevated ventricular   end-diastolic filling pressure. - Aortic valve: Trileaflet; normal thickness leaflets. There was   mild regurgitation. - Aortic root: The aortic root was normal in size. - Mitral valve: Calcified annulus. Mildly thickened leaflets .   There was  mild regurgitation. - Left atrium: The atrium was normal in size. - Right ventricle: Systolic function was normal. - Tricuspid valve: There was mild regurgitation. - Pulmonary arteries: Systolic pressure was within the normal   range. - Inferior vena cava: The vessel was normal in size. - Pericardium, extracardiac: There was no pericardial effusion.   02/20/13:    I first met her on 11/17/2012 and she reported a TIA-like spell earlier in June and was seen in the hospital by my colleague Dr. Leonie Man. He changed her aspirin to Plavix and held her metformin due to low blood sugar values. She reported being in her normal state of health during the late moring on 10/27/12 when she had sudden onset of R hand and arm shaking, which she not control, and she could not get her words out. Her sister's caregiver called 911. She had a full stroke w/u in the hospital which I reviewed: Her CT showed a small plaque in the MCA M2 branch and white matter changes, she had negative carotid Doppler studies, there was no acute stroke on MRI brain and she had intracranial atherosclerosis on MRA head. Her blood work was unremarkable, with normal lipids, A1c of 6.3. Carotid doppler study in June 2014 showed 0-49% ICA stenosis. She had transient neurological symptoms  including difficulty forming her words and right arm shakiness, no facial droop. She has a long-standing history of migraine headaches, but also c/o a bandlike sensation in the front. For her headaches she takes tylenol. She was given neurontin by a HA specialist in the past, but it was not helpful.  Since she was discharged she has had more hand tremor and intermittent zoning out at time, no frank convulsions. She has had a hand tremor for years. Her 2 nephews have tremors.  Since her last visit she presented to the emergency room on 01/11/2013 with chest pain, relieved by nitroglycerin sublingual. An acute coronary syndrome was excluded. She had a stress test: On  01/12/2013 she had a normal Lexi scan Myoview. She had a chest x-ray on 01/11/2013 which was negative for any acute or active cardiopulmonary disease. She had a head CT without contrast on 12/28/2012 which showed no acute intracranial abnormalities, near-complete opacification of the right sphenoid sinus, mild cerebral atrophy with extensive chronic microvascular ischemic changes. On 12/22/2012 she had a bilateral mammogram showing stable post lumpectomy site in the left breast and probably benign calcifications in the upper outer right breast which appear punctate and loosely grouped.  She saw her primary care physician on 01/25/2013 and endorsed anxiety and stressors. At the time of her first visit with me I ordered an EEG. This was normal in the awake state. I felt that her headaches were primarily tension-type headaches. She also had evidence of essential tremor, very mild. She is doing better in all aspect, she is starting to gain some weight back.    Her Past Medical History Is Significant For: Past Medical History:  Diagnosis Date  . Breast cancer ([place])    left breast  . Chronic constipation   . Chronic headaches   . Diabetes mellitus   . Fibrocystic breast   . Gastritis   . Gout    injection in the past  . Hyperlipidemia   . Melanoma in situ of left upper extremity ([place])   . Pelvic fracture ([place])   . TIA (transient ischemic attack) 08/2008   neg MRI/MRA and CT and carotid dopplers  . Venous insufficiency    Her Past Surgical History Is Significant For: Past Surgical History:  Procedure Laterality Date  . APPENDECTOMY    . ARM SKIN LESION BIOPSY / EXCISION Right 11/02/2016  . BREAST BIOPSY Left 10/18/2007  . BREAST CYST ASPIRATION    . BREAST EXCISIONAL BIOPSY Right    x2  . BREAST LUMPECTOMY  09/2007   with sentinel LN biopsy  . BREAST LUMPECTOMY     left  . EYE SURGERY     retinal, then cataract    Her Family History Is Significant For: Family History  Problem  Relation Age of Onset  . Breast cancer Sister   . Stroke Sister   . Cancer Sister        breast  . Breast cancer Sister   . Stroke Sister   . Cancer Sister        breast  . Stroke Mother   . Stroke Father   . Stroke Brother   . Cancer Brother        prostate  . Stroke Brother   . Stroke Sister   . CAD Sister     Her Social History Is Significant For: Social History   Socioeconomic History  . Marital status: Single    Spouse name: None  . Number of children: None  .  Years of education: college  . Highest education level: None  Social Needs  . Financial resource strain: None  . Food insecurity - worry: None  . Food insecurity - inability: None  . Transportation needs - medical: None  . Transportation needs - non-medical: None  Occupational History  . None  Tobacco Use  . Smoking status: Never Smoker  . Smokeless tobacco: Never Used  Substance and Sexual Activity  . Alcohol use: No    Alcohol/week: 0.0 oz  . Drug use: No  . Sexual activity: Not Currently  Other Topics Concern  . None  Social History Narrative   Lives with and cares for both sisters   Strong faith   Denies caffeine use     Her Allergies Are:  Allergies  Allergen Reactions  . Sulfonamide Derivatives Itching  . Aspirin Other (See Comments)    Burning and pain in stomach  :   Her Current Medications Are:  Outpatient Encounter Medications as of 08/05/2017  Medication Sig  . acetaminophen (TYLENOL) 325 MG tablet Take 2 tablets (650 mg total) by mouth every 6 (six) hours as needed for mild pain (or Fever >/= 101).  Marland Kitchen Besifloxacin HCl (BESIVANCE) 0.6 % SUSP Apply 1 drop to eye 4 (four) times daily. PLACE 1 DROP INTO THE RIGHT EYE FOUR TIMES A DAY FOR 2 DAYS FOLLOWING EACH EYE  . calcium carbonate (TUMS - DOSED IN MG ELEMENTAL CALCIUM) 500 MG chewable tablet Chew 1 tablet by mouth as needed for heartburn.   . citalopram (CELEXA) 10 MG tablet Take 0.5 tablets (5 mg total) by mouth daily.  .  clopidogrel (PLAVIX) 75 MG tablet TAKE ONE TABLET BY MOUTH EVERY MORNING WITH BREAKFAST.  . fluticasone (FLONASE) 50 MCG/ACT nasal spray Place into both nostrils daily.  Marland Kitchen glucose blood (ONE TOUCH ULTRA TEST) test strip USE AS DIRECTED TO CHECK BLOOD SUGAR ONCE A DAY AND AS NEEDED (DX. E11.9)  . metFORMIN (GLUCOPHAGE) 500 MG tablet Take 0.5 tablets (250 mg total) by mouth 2 (two) times daily with a meal.  . Multiple Vitamins-Minerals (PRESERVISION AREDS 2 PO) Take 1 tablet by mouth 2 (two) times daily.  . pantoprazole (PROTONIX) 40 MG tablet Take 1 tablet (40 mg total) by mouth daily.  . polyethylene glycol powder (GLYCOLAX/MIRALAX) powder DISSOLVE ONE (1) CAPFUL (17GM) INTO 4 TO8 OUNCES OF FLUID AND TAKE BYMOUTH ONCE DAILY AS DIRECTED  . ranitidine (ZANTAC) 300 MG tablet Take 1 tablet (300 mg total) by mouth daily as needed for heartburn.  . simethicone (MYLICON) 80 MG chewable tablet Chew 80 mg by mouth every 6 (six) hours as needed for flatulence.  . simvastatin (ZOCOR) 20 MG tablet Take 1 tablet (20 mg total) by mouth at bedtime.  . [DISCONTINUED] loratadine (CLARITIN) 10 MG tablet Take 10 mg by mouth daily.  . [DISCONTINUED] predniSONE (DELTASONE) 10 MG tablet Take 3 pills once daily by mouth for 3 days, then 2 pills once daily for 3 days, then 1 pill once daily for 3 days and then stop  . [DISCONTINUED] promethazine-dextromethorphan (PROMETHAZINE-DM) 6.25-15 MG/5ML syrup Take 5 mLs by mouth 4 (four) times daily as needed.   No facility-administered encounter medications on file as of 08/05/2017.   :  Review of Systems:  Out of a complete 14 point review of systems, all are reviewed and negative with the exception of these symptoms as listed below: Review of Systems  Neurological:       Patient reports that she has been doing very  well since last visit.     Objective:  Neurological Exam  Physical Exam Physical Examination:   Vitals:   08/05/17 1247  BP: 128/70  Pulse: (!) 101    General Examination: The patient is a very pleasant 84 y.o. female in no acute distress. She appears well-developed and well-nourished and well groomed.   HEENT:Normocephalic, atraumatic, pupils are equal, round and reactive to light and accommodation. Extraocular tracking is good without limitation to gaze excursion or nystagmus noted. Normal smooth pursuit is noted. Hearing is grossly intact. Face is symmetric with normal facial animation and normal facial sensation. Speech is clear with no dysarthria noted. There is mildly softer speech. She does have a tiny little bit of lower jaw and head tremor. Oropharynx exam reveals: no significant mouth dryness, adequate dental hygiene and mild airway crowding. Mallampati is class II. Tongue protrudes centrally and palate elevates symmetrically.   Chest:Clear to auscultation without wheezing, rhonchi or crackles noted.  Heart:S1+S2+0, regular and normal without murmurs, rubs or gallops noted.   Abdomen:Soft, non-tender and non-distended with normal bowel sounds appreciated on auscultation.  Extremities:There is no pitting edema in the distal lower extremities bilaterally. Pedal pulses are intact.  Skin: Warm and dry without trophic changes noted. There are no varicose veins.  Musculoskeletal: exam reveals no obvious joint deformities, tenderness or joint swelling or erythema.   Neurologically:  Mental status: The patient is awake, alert and oriented in all 4 spheres. Her memory, attention, language and knowledge are appropriate. There is no aphasia, agnosia, apraxia or anomia. Speech is clear with normal prosody and enunciation. Thought process is linear. Mood is congruent and affect is normal.  Cranial nerves are as described above under HEENT exam.  Motor exam: Normal bulk, strength and tone is noted. There is no drift, or rebound. She has an intermittent tremor in both hands, resting and with posture and action. Fine motor skills are  globally mildly impaired.    Cerebellar testing shows no dysmetria or intention tremor. There is no truncal or gait ataxia.  Sensory exam is intact to light touch in the upper and lower extremities.  Gait, station and balance: She stands up with mild difficulty and her posture is mildly stooped, but fairly age-appropriate. She walks cautiously and turns in 3 steps. She has a mild decrease in arm swing bilaterally. She has a single-point cane on the right, can walk a little without cane.    Assessment and Plan:   In summary, Lauren [name] is a very pleasant 84 year old female with a history of headaches, tremors, TIAsand stroke, who presents for follow-up. She had stroke workup in the hospital in 2018. Her exam is stable. Her BP has been good. She is encouraged to stay well hydrated with water and add some form of day-to-day exercise to her regimen. She has good family support. She has no new neurological complaints. I suggested a 6 month follow-up, sooner as needed.  I answered all their questions today and the patient and Lauren [name] were in agreement.  I spent 25 minutes in total face-to-face time with the patient, more than 50% of which was spent in counseling and coordination of care, reviewing test results, reviewing medication and discussing or reviewing the diagnosis of TIA, its prognosis and treatment options. Pertinent laboratory and imaging test results that were available during this visit with the patient were reviewed by me and considered in my medical decision making (see chart for details).

## 2017-08-23 ENCOUNTER — Other Ambulatory Visit: Payer: Self-pay

## 2017-08-23 ENCOUNTER — Encounter: Payer: Self-pay | Admitting: *Deleted

## 2017-08-23 ENCOUNTER — Emergency Department
Admission: EM | Admit: 2017-08-23 | Discharge: 2017-08-23 | Disposition: A | Discharge status: Home / Self Care | Payer: Medicare Other | Attending: Student in an Organized Health Care Education/Training Program | Admitting: Student in an Organized Health Care Education/Training Program

## 2017-08-23 DIAGNOSIS — Z79899 Other long term (current) drug therapy: Secondary | ICD-10-CM | POA: Diagnosis not present

## 2017-08-23 DIAGNOSIS — R040 Epistaxis: Secondary | ICD-10-CM

## 2017-08-23 DIAGNOSIS — E119 Type 2 diabetes mellitus without complications: Secondary | ICD-10-CM | POA: Diagnosis not present

## 2017-08-23 DIAGNOSIS — I10 Essential (primary) hypertension: Secondary | ICD-10-CM | POA: Insufficient documentation

## 2017-08-23 DIAGNOSIS — Z7984 Long term (current) use of oral hypoglycemic drugs: Secondary | ICD-10-CM | POA: Insufficient documentation

## 2017-08-23 LAB — COMPREHENSIVE METABOLIC PANEL
ALK PHOS: 61 U/L (ref 38–126)
ALT: 12 U/L — ABNORMAL LOW (ref 14–54)
ANION GAP: 8 (ref 5–15)
AST: 18 U/L (ref 15–41)
Albumin: 4 g/dL (ref 3.5–5.0)
BUN: 18 mg/dL (ref 6–20)
CALCIUM: 9.7 mg/dL (ref 8.9–10.3)
CO2: 29 mmol/L (ref 22–32)
CREATININE: 0.64 mg/dL (ref 0.44–1.00)
Chloride: 105 mmol/L (ref 101–111)
Glucose, Bld: 104 mg/dL — ABNORMAL HIGH (ref 65–99)
Potassium: 4.2 mmol/L (ref 3.5–5.1)
SODIUM: 142 mmol/L (ref 135–145)
Total Bilirubin: 0.6 mg/dL (ref 0.3–1.2)
Total Protein: 7.3 g/dL (ref 6.5–8.1)

## 2017-08-23 LAB — PROTIME-INR
INR: 0.96
Prothrombin Time: 12.7 seconds (ref 11.4–15.2)

## 2017-08-23 LAB — CBC
HEMATOCRIT: 41.9 % (ref 35.0–47.0)
Hemoglobin: 13.7 g/dL (ref 12.0–16.0)
MCH: 31.1 pg (ref 26.0–34.0)
MCHC: 32.8 g/dL (ref 32.0–36.0)
MCV: 94.8 fL (ref 80.0–100.0)
PLATELETS: 183 10*3/uL (ref 150–440)
RBC: 4.42 MIL/uL (ref 3.80–5.20)
RDW: 14 % (ref 11.5–14.5)
WBC: 10.6 10*3/uL (ref 3.6–11.0)

## 2017-08-23 MED ORDER — AMOXICILLIN-POT CLAVULANATE 875-125 MG PO TABS: 1.0000 | ORAL_TABLET | Freq: Two times a day (BID) | ORAL | 0 refills | Status: AC

## 2017-08-23 MED ORDER — FLUCONAZOLE 150 MG PO TABS: 150.0000 mg | ORAL_TABLET | Freq: Every day | ORAL | 0 refills | Status: DC

## 2017-08-23 MED ORDER — LIDOCAINE HCL 2 % EX GEL
1.0000 "application " | Freq: Once | CUTANEOUS | Status: DC
  Filled 2017-08-23: qty 5

## 2017-08-23 MED ORDER — OXYMETAZOLINE HCL 0.05 % NA SOLN: 1.0000 | Freq: Once | NASAL | Status: DC

## 2017-08-23 MED ORDER — LIDOCAINE HCL 2 % EX GEL
CUTANEOUS | Status: AC
  Filled 2017-08-23: qty 10

## 2017-08-23 MED ORDER — LIDOCAINE HCL 2 % EX GEL: 1.0000 "application " | Freq: Once | CUTANEOUS | Status: DC

## 2017-08-23 NOTE — ED Provider Notes (Signed)
[facility] Emergency Department Provider Note    First MD Initiated Contact with Patient 08/23/17 1914     (approximate)  I have reviewed the triage vital signs and the nursing notes.   HISTORY  Chief Complaint Epistaxis    HPI Lauren [name] is a 84 y.o. female with a history of epistaxis presents with bleeding from her left nare. Marland Kitchen  Has had to have this packed in the past.  As an trauma.  No headache.  She is on Plavix for history of stroke.  Denies any pain.  Past Medical History:  Diagnosis Date  . Breast cancer ([place])    left breast  . Chronic constipation   . Chronic headaches   . Diabetes mellitus   . Fibrocystic breast   . Gastritis   . Gout    injection in the past  . Hyperlipidemia   . Melanoma in situ of left upper extremity ([place])   . Pelvic fracture ([place])   . TIA (transient ischemic attack) 08/2008   neg MRI/MRA and CT and carotid dopplers  . Venous insufficiency    Family History  Problem Relation Age of Onset  . Breast cancer Sister   . Stroke Sister   . Cancer Sister        breast  . Breast cancer Sister   . Stroke Sister   . Cancer Sister        breast  . Stroke Mother   . Stroke Father   . Stroke Brother   . Cancer Brother        prostate  . Stroke Brother   . Stroke Sister   . CAD Sister    Past Surgical History:  Procedure Laterality Date  . APPENDECTOMY    . ARM SKIN LESION BIOPSY / EXCISION Right 11/02/2016  . BREAST BIOPSY Left 10/18/2007  . BREAST CYST ASPIRATION    . BREAST EXCISIONAL BIOPSY Right    x2  . BREAST LUMPECTOMY  09/2007   with sentinel LN biopsy  . BREAST LUMPECTOMY     left  . EYE SURGERY     retinal, then cataract   Patient Active Problem List   Diagnosis Date Noted  . Elevated TSH 03/04/2017  . Diastolic dysfunction 33/82/5053  . Acute bronchitis 09/17/2016  . Acute CVA (cerebrovascular accident) ([place]) 08/15/2016  . HTN (hypertension) 08/15/2016  . GERD (gastroesophageal  reflux disease) 08/06/2015  . Hearing loss 08/06/2015  . Estrogen deficiency 05/01/2014  . Left knee pain 09/22/2013  . Chest pain, musculoskeletal 08/05/2013  . Encounter for Medicare annual wellness exam 04/04/2013  . Colon cancer screening 04/04/2013  . History of melanoma excision 09/27/2012  . History of breast cancer, left 01/08/2011  . History of pelvic fracture 09/17/2010  . DEGENERATIVE JOINT DISEASE 02/13/2010  . Migraine variant 11/06/2009  . Osteopenia 10/18/2008  . Transient ischemic attack 09/10/2008  . Diabetes type 2, controlled ([place]) 10/06/2007  . VENOUS INSUFFICIENCY 10/06/2007  . Diverticulosis of colon 10/06/2007  . FIBROCYSTIC BREAST DISEASE 10/06/2007  . HYPERCHOLESTEROLEMIA, PURE 11/29/2006      Prior to Admission medications   Medication Sig Start Date End Date Taking? Authorizing Provider  acetaminophen (TYLENOL) 325 MG tablet Take 2 tablets (650 mg total) by mouth every 6 (six) hours as needed for mild pain (or Fever >/= 101). 03/02/16   Eber Jones, MD  amoxicillin-clavulanate (AUGMENTIN) 875-125 MG tablet Take 1 tablet by mouth 2 (two) times daily for 7 days. 08/23/17 08/30/17  Merlyn Lot, MD  Besifloxacin HCl (BESIVANCE) 0.6 % SUSP Apply 1 drop to eye 4 (four) times daily. PLACE 1 DROP INTO THE RIGHT EYE FOUR TIMES A DAY FOR 2 DAYS FOLLOWING EACH EYE    [provider]  calcium carbonate (TUMS - DOSED IN MG ELEMENTAL CALCIUM) 500 MG chewable tablet Chew 1 tablet by mouth as needed for heartburn.     [provider]  citalopram (CELEXA) 10 MG tablet Take 0.5 tablets (5 mg total) by mouth daily. 11/10/16   Tower, Wynelle Fanny, MD  clopidogrel (PLAVIX) 75 MG tablet TAKE ONE TABLET BY MOUTH EVERY MORNING WITH BREAKFAST. 11/10/16   Tower, Wynelle Fanny, MD  fluconazole (DIFLUCAN) 150 MG tablet Take 1 tablet (150 mg total) by mouth daily. 08/23/17   Merlyn Lot, MD  fluticasone [facility]) 50 MCG/ACT nasal spray Place into both nostrils daily.     [provider]  glucose blood (ONE TOUCH ULTRA TEST) test strip USE AS DIRECTED TO CHECK BLOOD SUGAR ONCE A DAY AND AS NEEDED (DX. E11.9) 03/14/15   Tower, Wynelle Fanny, MD  metFORMIN (GLUCOPHAGE) 500 MG tablet Take 0.5 tablets (250 mg total) by mouth 2 (two) times daily with a meal. 11/10/16   Tower, Wynelle Fanny, MD  Multiple Vitamins-Minerals (PRESERVISION AREDS 2 PO) Take 1 tablet by mouth 2 (two) times daily.    [provider]  pantoprazole (PROTONIX) 40 MG tablet Take 1 tablet (40 mg total) by mouth daily. 09/22/16   Tower, Wynelle Fanny, MD  polyethylene glycol powder (GLYCOLAX/MIRALAX) powder DISSOLVE ONE (1) CAPFUL (17GM) INTO 4 TO8 OUNCES OF FLUID AND TAKE BYMOUTH ONCE DAILY AS DIRECTED 03/23/17   Tower, Wynelle Fanny, MD  ranitidine (ZANTAC) 300 MG tablet Take 1 tablet (300 mg total) by mouth daily as needed for heartburn. 11/10/16   Tower, Wynelle Fanny, MD  simethicone (MYLICON) 80 MG chewable tablet Chew 80 mg by mouth every 6 (six) hours as needed for flatulence.    [provider]  simvastatin (ZOCOR) 20 MG tablet Take 1 tablet (20 mg total) by mouth at bedtime. 04/26/17   Tower, Wynelle Fanny, MD    Allergies Sulfonamide derivatives and Aspirin    Social History Social History   Tobacco Use  . Smoking status: Never Smoker  . Smokeless tobacco: Never Used  Substance Use Topics  . Alcohol use: No    Alcohol/week: 0.0 oz  . Drug use: No    Review of Systems Patient denies headaches, rhinorrhea, blurry vision, numbness, shortness of breath, chest pain, edema, cough, abdominal pain, nausea, vomiting, diarrhea, dysuria, fevers, rashes or hallucinations unless otherwise stated above in HPI. ____________________________________________   PHYSICAL EXAM:  VITAL SIGNS: Vitals:   08/23/17 1720  BP: (!) 157/66  Pulse: 66  Resp: 16  Temp: 97.8 F (36.6 C)  SpO2: 94%    Constitutional: Alert and oriented. Well appearing and in no acute distress. Eyes: Conjunctivae are  normal.  Head: Atraumatic. Nose: No congestion/rhinnorhea.  Left anterior epistaxis with small polyp anetiorly Mouth/Throat: Mucous membranes are moist.  No posterior bleeding Neck: No stridor. Painless ROM.  Cardiovascular: Normal rate, regular rhythm. Grossly normal heart sounds.  Good peripheral circulation. Respiratory: Normal respiratory effort.  No retractions. Lungs CTAB. Gastrointestinal: Soft and nontender. No distention. No abdominal bruits. No CVA tenderness. Genitourinary:  Musculoskeletal: No lower extremity tenderness nor edema.  No joint effusions. Neurologic:  Normal speech and language. No gross focal neurologic deficits are appreciated. No facial droop Skin:  Skin is warm,  dry and intact. No rash noted. Psychiatric: Mood and affect are normal. Speech and behavior are normal.  ____________________________________________   LABS (all labs ordered are listed, but only abnormal results are displayed)  Results for orders placed or performed during the hospital encounter of 08/23/17 (from the past 24 hour(s))  CBC     Status: None   Collection Time: 08/23/17  5:40 PM  Result Value Ref Range   WBC 10.6 3.6 - 11.0 K/uL   RBC 4.42 3.80 - 5.20 MIL/uL   Hemoglobin 13.7 12.0 - 16.0 g/dL   HCT 41.9 35.0 - 47.0 %   MCV 94.8 80.0 - 100.0 fL   MCH 31.1 26.0 - 34.0 pg   MCHC 32.8 32.0 - 36.0 g/dL   RDW 14.0 11.5 - 14.5 %   Platelets 183 150 - 440 K/uL  Protime-INR - (order if Patient is taking Coumadin / Warfarin)     Status: None   Collection Time: 08/23/17  5:40 PM  Result Value Ref Range   Prothrombin Time 12.7 11.4 - 15.2 seconds   INR 0.96   Comprehensive metabolic panel     Status: Abnormal   Collection Time: 08/23/17  5:40 PM  Result Value Ref Range   Sodium 142 135 - 145 mmol/L   Potassium 4.2 3.5 - 5.1 mmol/L   Chloride 105 101 - 111 mmol/L   CO2 29 22 - 32 mmol/L   Glucose, Bld 104 (H) 65 - 99 mg/dL   BUN 18 6 - 20 mg/dL   Creatinine, Ser 0.64 0.44 - 1.00  mg/dL   Calcium 9.7 8.9 - 10.3 mg/dL   Total Protein 7.3 6.5 - 8.1 g/dL   Albumin 4.0 3.5 - 5.0 g/dL   AST 18 15 - 41 U/L   ALT 12 (L) 14 - 54 U/L   Alkaline Phosphatase 61 38 - 126 U/L   Total Bilirubin 0.6 0.3 - 1.2 mg/dL   GFR calc non Af Amer >60 >60 mL/min   GFR calc Af Amer >60 >60 mL/min   Anion gap 8 5 - 15   ____________________________________________ ____________________________________________  RADIOLOGY   ____________________________________________   PROCEDURES  Procedure(s) performed:  .Epistaxis Management Date/Time: 08/23/2017 8:53 PM Performed by: Merlyn Lot, MD Authorized by: Merlyn Lot, MD   Consent:    Consent obtained:  Verbal Anesthesia (see MAR for exact dosages):    Anesthesia method:  Topical application   Topical anesthetic:  Lidocaine gel Procedure details:    Treatment site:  L anterior   Treatment method:  Merocel sponge   Treatment complexity:  Limited   Treatment episode: initial   Post-procedure details:    Assessment:  Bleeding stopped   Patient tolerance of procedure:  Tolerated well, no immediate complications      Critical Care performed: no ____________________________________________   INITIAL IMPRESSION / ASSESSMENT AND PLAN / ED COURSE  Pertinent labs & imaging results that were available during my care of the patient were reviewed by me and considered in my medical decision making (see chart for details).  DDX: epistaxis, trauma, polyp  Lauren [name] is a 84 y.o. who presents to the ED with epistaxis as described above.  Patient in no acute distress.  Protecting airway.  No evidence of posterior bleed.  Discussed treatment options with patient and given her use of Plavix will proceed with Merocel packing.  Packing performed patient remained stable.  No bleeding.  Patient given antibiotics and follow-up with ENT.  As part of my medical decision making, I reviewed the following data within the  [place] notes reviewed and incorporated, Labs reviewed, notes from prior ED visits.  ____________________________________________   FINAL CLINICAL IMPRESSION(S) / ED DIAGNOSES  Final diagnoses:  Left-sided epistaxis      NEW MEDICATIONS STARTED DURING THIS VISIT:  Discharge Medication List as of 08/23/2017  8:53 PM    START taking these medications   Details  amoxicillin-clavulanate (AUGMENTIN) 875-125 MG tablet Take 1 tablet by mouth 2 (two) times daily for 7 days., Starting Mon 08/23/2017, Until Mon 08/30/2017, Print    fluconazole (DIFLUCAN) 150 MG tablet Take 1 tablet (150 mg total) by mouth daily., Starting Mon 08/23/2017, Print         Note:  This document was prepared using Dragon voice recognition software and may include unintentional dictation errors.    Merlyn Lot, MD 08/23/17 2312

## 2017-08-23 NOTE — ED Triage Notes (Signed)
Pt reports nosebleed from left nares.  No bleeding in triage.  No headache.  Pt is on blood thinners.  Pt alert.  Speech clear.

## 2017-08-27 ENCOUNTER — Telehealth: Payer: Self-pay | Admitting: *Deleted

## 2017-08-27 DIAGNOSIS — E038 Other specified hypothyroidism: Secondary | ICD-10-CM

## 2017-08-27 DIAGNOSIS — E039 Hypothyroidism, unspecified: Secondary | ICD-10-CM

## 2017-08-27 DIAGNOSIS — R040 Epistaxis: Secondary | ICD-10-CM | POA: Diagnosis not present

## 2017-08-27 DIAGNOSIS — J340 Abscess, furuncle and carbuncle of nose: Secondary | ICD-10-CM | POA: Diagnosis not present

## 2017-08-27 DIAGNOSIS — I10 Essential (primary) hypertension: Secondary | ICD-10-CM

## 2017-08-27 DIAGNOSIS — E1159 Type 2 diabetes mellitus with other circulatory complications: Secondary | ICD-10-CM

## 2017-08-27 DIAGNOSIS — E7800 Pure hypercholesterolemia, unspecified: Secondary | ICD-10-CM

## 2017-08-27 NOTE — Telephone Encounter (Signed)
The orders are in.

## 2017-08-27 NOTE — Telephone Encounter (Signed)
Copied from [place] [id number]. Topic: Quick Communication - See Telephone Encounter >> Aug 27, 2017 10:41 AM Antonieta Iba C wrote: CRM for notification. See Telephone encounter for: 08/27/17.  When pt come in to her lab apt on 08/31/17 she would like to have her A1C checked.

## 2017-08-31 ENCOUNTER — Other Ambulatory Visit (INDEPENDENT_AMBULATORY_CARE_PROVIDER_SITE_OTHER): Payer: Medicare Other

## 2017-08-31 DIAGNOSIS — E038 Other specified hypothyroidism: Secondary | ICD-10-CM

## 2017-08-31 DIAGNOSIS — E7800 Pure hypercholesterolemia, unspecified: Secondary | ICD-10-CM | POA: Diagnosis not present

## 2017-08-31 DIAGNOSIS — I10 Essential (primary) hypertension: Secondary | ICD-10-CM | POA: Diagnosis not present

## 2017-08-31 DIAGNOSIS — E039 Hypothyroidism, unspecified: Secondary | ICD-10-CM

## 2017-08-31 DIAGNOSIS — E1159 Type 2 diabetes mellitus with other circulatory complications: Secondary | ICD-10-CM

## 2017-08-31 LAB — COMPREHENSIVE METABOLIC PANEL WITH GFR
ALT: 12 U/L (ref 0–35)
AST: 16 U/L (ref 0–37)
Albumin: 3.9 g/dL (ref 3.5–5.2)
Alkaline Phosphatase: 53 U/L (ref 39–117)
BUN: 20 mg/dL (ref 6–23)
CO2: 32 meq/L (ref 19–32)
Calcium: 10.2 mg/dL (ref 8.4–10.5)
Chloride: 103 meq/L (ref 96–112)
Creatinine, Ser: 0.76 mg/dL (ref 0.40–1.20)
GFR: 76.4 mL/min
Glucose, Bld: 123 mg/dL — ABNORMAL HIGH (ref 70–99)
Potassium: 5 meq/L (ref 3.5–5.1)
Sodium: 140 meq/L (ref 135–145)
Total Bilirubin: 0.6 mg/dL (ref 0.2–1.2)
Total Protein: 7.3 g/dL (ref 6.0–8.3)

## 2017-08-31 LAB — LIPID PANEL
CHOLESTEROL: 134 mg/dL (ref 0–200)
HDL: 39 mg/dL — ABNORMAL LOW (ref >39.00)
LDL Cholesterol: 66 mg/dL (ref 0–99)
NonHDL: 95.3
TRIGLYCERIDES: 147 mg/dL (ref 0.0–149.0)
Total CHOL/HDL Ratio: 3
VLDL: 29.4 mg/dL (ref 0.0–40.0)

## 2017-08-31 LAB — T4, FREE: Free T4: 0.97 ng/dL (ref 0.60–1.60)

## 2017-08-31 LAB — HEMOGLOBIN A1C: Hgb A1c MFr Bld: 7.1 % — ABNORMAL HIGH (ref 4.6–6.5)

## 2017-08-31 LAB — TSH: TSH: 6.62 u[IU]/mL — AB (ref 0.35–4.50)

## 2017-09-06 ENCOUNTER — Encounter (INDEPENDENT_AMBULATORY_CARE_PROVIDER_SITE_OTHER): Payer: Medicare Other | Admitting: Ophthalmology

## 2017-09-06 DIAGNOSIS — E113292 Type 2 diabetes mellitus with mild nonproliferative diabetic retinopathy without macular edema, left eye: Secondary | ICD-10-CM | POA: Diagnosis not present

## 2017-09-06 DIAGNOSIS — H43812 Vitreous degeneration, left eye: Secondary | ICD-10-CM | POA: Diagnosis not present

## 2017-09-06 DIAGNOSIS — E113311 Type 2 diabetes mellitus with moderate nonproliferative diabetic retinopathy with macular edema, right eye: Secondary | ICD-10-CM

## 2017-09-06 DIAGNOSIS — H3509 Other intraretinal microvascular abnormalities: Secondary | ICD-10-CM | POA: Diagnosis not present

## 2017-09-06 DIAGNOSIS — I10 Essential (primary) hypertension: Secondary | ICD-10-CM | POA: Diagnosis not present

## 2017-09-06 DIAGNOSIS — E11311 Type 2 diabetes mellitus with unspecified diabetic retinopathy with macular edema: Secondary | ICD-10-CM

## 2017-09-06 DIAGNOSIS — H35033 Hypertensive retinopathy, bilateral: Secondary | ICD-10-CM | POA: Diagnosis not present

## 2017-09-06 DIAGNOSIS — H353122 Nonexudative age-related macular degeneration, left eye, intermediate dry stage: Secondary | ICD-10-CM | POA: Diagnosis not present

## 2017-09-07 ENCOUNTER — Encounter: Payer: Self-pay | Admitting: Family Medicine

## 2017-09-07 ENCOUNTER — Ambulatory Visit (INDEPENDENT_AMBULATORY_CARE_PROVIDER_SITE_OTHER): Payer: Medicare Other | Admitting: Family Medicine

## 2017-09-07 VITALS — BP 122/68 | HR 94 | Temp 97.3°F | Ht 62.0 in | Wt 118.2 lb

## 2017-09-07 DIAGNOSIS — K219 Gastro-esophageal reflux disease without esophagitis: Secondary | ICD-10-CM | POA: Diagnosis not present

## 2017-09-07 DIAGNOSIS — E038 Other specified hypothyroidism: Secondary | ICD-10-CM

## 2017-09-07 DIAGNOSIS — E7800 Pure hypercholesterolemia, unspecified: Secondary | ICD-10-CM

## 2017-09-07 DIAGNOSIS — I69359 Hemiplegia and hemiparesis following cerebral infarction affecting unspecified side: Secondary | ICD-10-CM

## 2017-09-07 DIAGNOSIS — E039 Hypothyroidism, unspecified: Secondary | ICD-10-CM

## 2017-09-07 DIAGNOSIS — E1159 Type 2 diabetes mellitus with other circulatory complications: Secondary | ICD-10-CM

## 2017-09-07 DIAGNOSIS — I10 Essential (primary) hypertension: Secondary | ICD-10-CM

## 2017-09-07 DIAGNOSIS — L989 Disorder of the skin and subcutaneous tissue, unspecified: Secondary | ICD-10-CM | POA: Diagnosis not present

## 2017-09-07 DIAGNOSIS — I639 Cerebral infarction, unspecified: Secondary | ICD-10-CM | POA: Diagnosis not present

## 2017-09-07 DIAGNOSIS — R531 Weakness: Secondary | ICD-10-CM | POA: Insufficient documentation

## 2017-09-07 NOTE — Assessment & Plan Note (Signed)
Sees dermatology Sole of R foot  WaOn bx report "atypical intraepidermal lentiginous melanocytic proliferation"s bx  Continues to watch this in face of hx of melanoma

## 2017-09-07 NOTE — Progress Notes (Signed)
Subjective:    Patient ID: Lauren [name], female    DOB: 11-27-1927, 84 y.o.   MRN: [id number]  HPI Here for f/u of chronic health problems  Feeling pretty good overall   Had to go to ED for nosebleed and then Dr Pryor Ochoa  None since then   [facility] Readings from Last 3 Encounters:  09/07/17 118 lb 4 oz (53.6 kg)  08/23/17 118 lb (53.5 kg)  08/05/17 118 lb 8 oz (53.8 kg)  wt is down a few lb  Eating fairly well per pt and caregiver - has a big breakfast and meals are made for her (may eat less later in the day)  21.63 kg/m     bp is stable today  No cp or palpitations or headaches or edema  No side effects to medicines  BP Readings from Last 3 Encounters:  09/07/17 122/68  08/23/17 (!) 157/66  08/05/17 128/70    Off lisinopril    DM2 Lab Results  Component Value Date   HGBA1C 7.1 (H) 08/31/2017   down from 7.3  No low glucose readings  Usually in one teens-120s fasting (does not want to check later)   Eye exam 2/19 with retinopathy  Getting eye injections now  Thinks her vision is improved - will f/u in 8 weeks   Hyperlipidemia Lab Results  Component Value Date   CHOL 134 08/31/2017   CHOL 152 02/23/2017   CHOL 135 08/16/2016   Lab Results  Component Value Date   HDL 39.00 (L) 08/31/2017   HDL 34.00 (L) 02/23/2017   HDL 34 (L) 08/16/2016   Lab Results  Component Value Date   LDLCALC 66 08/31/2017   LDLCALC 71 08/16/2016   LDLCALC 67 05/13/2016   Lab Results  Component Value Date   TRIG 147.0 08/31/2017   TRIG 203.0 (H) 02/23/2017   TRIG 152 (H) 08/16/2016   Lab Results  Component Value Date   CHOLHDL 3 08/31/2017   CHOLHDL 4 02/23/2017   CHOLHDL 4.0 08/16/2016   Lab Results  Component Value Date   LDLDIRECT 49.0 02/23/2017   LDLDIRECT 76.2 04/24/2014   LDLDIRECT 46.3 10/01/2009   simvastatin and diet  Well controlled  Not as much exercise due to weather - is getting back to walking a bit now (when safe)    Sub clinical hypothyroid Lab  Results  Component Value Date   TSH 6.62 (H) 08/31/2017   Free T4 nl at 0.97 Not gaining weight    Has had a spot on foot- dermatology following-had initially abnormal biopsy (greyish brown macule on sole of R foot) On bx report "atypical intraepidermal lentiginous melanocytic proliferation" They continue to follow this   Patient Active Problem List   Diagnosis Date Noted  . Hemiparesis due to recent stroke ([place]) 09/07/2017  . Skin lesion of foot 09/07/2017  . Subclinical hypothyroidism 03/04/2017  . Diastolic dysfunction 68/04/7516  . Acute CVA (cerebrovascular accident) ([place]) 08/15/2016  . HTN (hypertension) 08/15/2016  . GERD (gastroesophageal reflux disease) 08/06/2015  . Hearing loss 08/06/2015  . Estrogen deficiency 05/01/2014  . Left knee pain 09/22/2013  . Encounter for Medicare annual wellness exam 04/04/2013  . Colon cancer screening 04/04/2013  . History of melanoma excision 09/27/2012  . History of breast cancer, left 01/08/2011  . History of pelvic fracture 09/17/2010  . DEGENERATIVE JOINT DISEASE 02/13/2010  . Migraine variant 11/06/2009  . Osteopenia 10/18/2008  . Transient ischemic attack 09/10/2008  . Diabetes mellitus type II, controlled ([place])  10/06/2007  . VENOUS INSUFFICIENCY 10/06/2007  . Diverticulosis of colon 10/06/2007  . FIBROCYSTIC BREAST DISEASE 10/06/2007  . HYPERCHOLESTEROLEMIA, PURE 11/29/2006   Past Medical History:  Diagnosis Date  . Breast cancer ([place])    left breast  . Chronic constipation   . Chronic headaches   . Diabetes mellitus   . Fibrocystic breast   . Gastritis   . Gout    injection in the past  . Hyperlipidemia   . Melanoma in situ of left upper extremity ([place])   . Pelvic fracture ([place])   . TIA (transient ischemic attack) 08/2008   neg MRI/MRA and CT and carotid dopplers  . Venous insufficiency    Past Surgical History:  Procedure Laterality Date  . APPENDECTOMY    . ARM SKIN LESION BIOPSY / EXCISION Right  11/02/2016  . BREAST BIOPSY Left 10/18/2007  . BREAST CYST ASPIRATION    . BREAST EXCISIONAL BIOPSY Right    x2  . BREAST LUMPECTOMY  09/2007   with sentinel LN biopsy  . BREAST LUMPECTOMY     left  . EYE SURGERY     retinal, then cataract   Social History   Tobacco Use  . Smoking status: Never Smoker  . Smokeless tobacco: Never Used  Substance Use Topics  . Alcohol use: No    Alcohol/week: 0.0 oz  . Drug use: No   Family History  Problem Relation Age of Onset  . Breast cancer Sister   . Stroke Sister   . Cancer Sister        breast  . Breast cancer Sister   . Stroke Sister   . Cancer Sister        breast  . Stroke Mother   . Stroke Father   . Stroke Brother   . Cancer Brother        prostate  . Stroke Brother   . Stroke Sister   . CAD Sister    Allergies  Allergen Reactions  . Sulfonamide Derivatives Itching  . Aspirin Other (See Comments)    Burning and pain in stomach   Current Outpatient Medications on File Prior to Visit  Medication Sig Dispense Refill  . acetaminophen (TYLENOL) 325 MG tablet Take 2 tablets (650 mg total) by mouth every 6 (six) hours as needed for mild pain (or Fever >/= 101).    Marland Kitchen Besifloxacin HCl (BESIVANCE) 0.6 % SUSP Apply 1 drop to eye 4 (four) times daily. PLACE 1 DROP INTO THE RIGHT EYE FOUR TIMES A DAY FOR 2 DAYS FOLLOWING EACH EYE    . calcium carbonate (TUMS - DOSED IN MG ELEMENTAL CALCIUM) 500 MG chewable tablet Chew 1 tablet by mouth as needed for heartburn.     . citalopram (CELEXA) 10 MG tablet Take 0.5 tablets (5 mg total) by mouth daily. 15 tablet 11  . clopidogrel (PLAVIX) 75 MG tablet TAKE ONE TABLET BY MOUTH EVERY MORNING WITH BREAKFAST. 30 tablet 11  . fluticasone (FLONASE) 50 MCG/ACT nasal spray Place into both nostrils daily.    Marland Kitchen glucose blood (ONE TOUCH ULTRA TEST) test strip USE AS DIRECTED TO CHECK BLOOD SUGAR ONCE A DAY AND AS NEEDED (DX. E11.9) 100 each 1  . metFORMIN (GLUCOPHAGE) 500 MG tablet Take 0.5 tablets  (250 mg total) by mouth 2 (two) times daily with a meal. 60 tablet 11  . Multiple Vitamins-Minerals (PRESERVISION AREDS 2 PO) Take 1 tablet by mouth 2 (two) times daily.    . pantoprazole (PROTONIX) 40 MG  tablet Take 1 tablet (40 mg total) by mouth daily. 30 tablet 11  . polyethylene glycol powder (GLYCOLAX/MIRALAX) powder DISSOLVE ONE (1) CAPFUL (17GM) INTO 4 TO8 OUNCES OF FLUID AND TAKE BYMOUTH ONCE DAILY AS DIRECTED 527 g 1  . ranitidine (ZANTAC) 300 MG tablet Take 1 tablet (300 mg total) by mouth daily as needed for heartburn. 30 tablet 11  . simethicone (MYLICON) 80 MG chewable tablet Chew 80 mg by mouth every 6 (six) hours as needed for flatulence.    . simvastatin (ZOCOR) 20 MG tablet Take 1 tablet (20 mg total) by mouth at bedtime. 90 tablet 1   No current facility-administered medications on file prior to visit.      Review of Systems  Constitutional: Positive for fatigue. Negative for activity change, appetite change, fever and unexpected weight change.       Wears out more easily  HENT: Negative for congestion, ear pain, rhinorrhea, sinus pressure and sore throat.   Eyes: Negative for pain, redness and visual disturbance.  Respiratory: Negative for cough, shortness of breath and wheezing.   Cardiovascular: Negative for chest pain and palpitations.  Gastrointestinal: Negative for abdominal pain, blood in stool, constipation and diarrhea.       Gas and indigestion stay the same   Endocrine: Negative for polydipsia and polyuria.  Genitourinary: Negative for dysuria, frequency and urgency.  Musculoskeletal: Negative for arthralgias, back pain and myalgias.  Skin: Negative for pallor and rash.       Lesion on R foot was removed  Allergic/Immunologic: Negative for environmental allergies.  Neurological: Negative for dizziness, syncope and headaches.  Hematological: Negative for adenopathy. Does not bruise/bleed easily.  Psychiatric/Behavioral: Negative for decreased concentration,  dysphoric mood and sleep disturbance. The patient is not nervous/anxious.        Objective:   Physical Exam  Constitutional: She appears well-developed and well-nourished. No distress.  Well but frail appearing elderly female   HENT:  Head: Normocephalic and atraumatic.  Mouth/Throat: Oropharynx is clear and moist.  Eyes: Pupils are equal, round, and reactive to light. Conjunctivae and EOM are normal.  Neck: Normal range of motion. Neck supple. No JVD present. Carotid bruit is not present. No thyromegaly present.  Cardiovascular: Normal rate, regular rhythm, normal heart sounds and intact distal pulses. Exam reveals no gallop.  Plus one pedal pulses   Pulmonary/Chest: Effort normal and breath sounds normal. No respiratory distress. She has no wheezes. She has no rales.  No crackles  Abdominal: Soft. Bowel sounds are normal. She exhibits no distension, no abdominal bruit and no mass. There is no tenderness.  Musculoskeletal: She exhibits no edema.  Kyphosis   Lymphadenopathy:    She has no cervical adenopathy.  Neurological: She is alert. She has normal reflexes. No cranial nerve deficit. She exhibits normal muscle tone. Coordination normal.  Baseline hemiparesis - continues to improve   Skin: Skin is warm and dry. No rash noted.  Grey/brown macular lesion on sole of R foot-per pt area that was operated on   No calluses   Psychiatric: She has a normal mood and affect.  Mood is good Cognition seems slower- slow speech and delayed answering questions but she seems mentally clear           Assessment & Plan:   Problem List Items Addressed This Visit      Cardiovascular and Mediastinum   Hemiparesis due to recent stroke ([place])    No clinical changes       HTN (hypertension)  bp in fair control at this time  BP Readings from Last 1 Encounters:  09/07/17 122/68   No changes needed Disc lifstyle change with low sodium diet and exercise    Labs reviewed          Endocrine   Diabetes mellitus type II, controlled ([place]) - Primary    Lab Results  Component Value Date   HGBA1C 7.1 (H) 08/31/2017   Overall stable Eating well - disc with caregiver Difficult to get exercise with current medical issues-but makes the effort to walk when she gets assistance       Subclinical hypothyroidism    Lab Results  Component Value Date   TSH 6.62 (H) 08/31/2017   Free T4 is nl at 0.97 Apprehensive to treat if not ready clinically (has had wt loss/not gain) Will watch closely  Some cognitive slowing-may need to start levothyroxine in the future         Musculoskeletal and Integument   Skin lesion of foot    Sees dermatology Sole of R foot  WaOn bx report "atypical intraepidermal lentiginous melanocytic proliferation"s bx  Continues to watch this in face of hx of melanoma         Other   HYPERCHOLESTEROLEMIA, PURE    Disc goals for lipids and reasons to control them Rev labs with pt Rev low sat fat diet in detail Controlled with simvastatin and diet  Disc imp of exercise (as able and tolerated) for HDL

## 2017-09-07 NOTE — Assessment & Plan Note (Signed)
Lab Results  Component Value Date   TSH 6.62 (H) 08/31/2017   Free T4 is nl at 0.97 Apprehensive to treat if not ready clinically (has had wt loss/not gain) Will watch closely  Some cognitive slowing-may need to start levothyroxine in the future

## 2017-09-07 NOTE — Patient Instructions (Addendum)
Walk when you feel safe to and have help   Store brand of miralax is ok to use (cheaper) - you do not have to get the name brand  Labs are fairly stable   Stay as active as you can   Keep reading (the best you can) - with vision improving -this helps with memory and concentration  Also puzzles   We will continue to watch thyroid    We will see you back for annual exam

## 2017-09-07 NOTE — Assessment & Plan Note (Signed)
Lab Results  Component Value Date   HGBA1C 7.1 (H) 08/31/2017   Overall stable Eating well - disc with caregiver Difficult to get exercise with current medical issues-but makes the effort to walk when she gets assistance

## 2017-09-07 NOTE — Assessment & Plan Note (Signed)
Chronic indigestion and gas  On H2 and ppi  Declines GI referral again  No change from previous

## 2017-09-07 NOTE — Assessment & Plan Note (Signed)
Disc goals for lipids and reasons to control them Rev labs with pt Rev low sat fat diet in detail Controlled with simvastatin and diet  Disc imp of exercise (as able and tolerated) for HDL

## 2017-09-07 NOTE — Assessment & Plan Note (Signed)
bp in fair control at this time  BP Readings from Last 1 Encounters:  09/07/17 122/68   No changes needed Disc lifstyle change with low sodium diet and exercise    Labs reviewed

## 2017-09-07 NOTE — Assessment & Plan Note (Signed)
No clinical changes 

## 2017-09-14 DIAGNOSIS — R040 Epistaxis: Secondary | ICD-10-CM | POA: Diagnosis not present

## 2017-10-14 IMAGING — CR DG FEMUR 2+V*L*
1 series · 4 of 4 positions shown · non-contrast
Comparison: None.

CLINICAL DATA: Left hip pain

EXAM:
LEFT FEMUR 2 VIEWS

[Series 1: dg femur min 2 views left · 0.14mm/px · 4 of 4 slices shown]
[im 1/4]
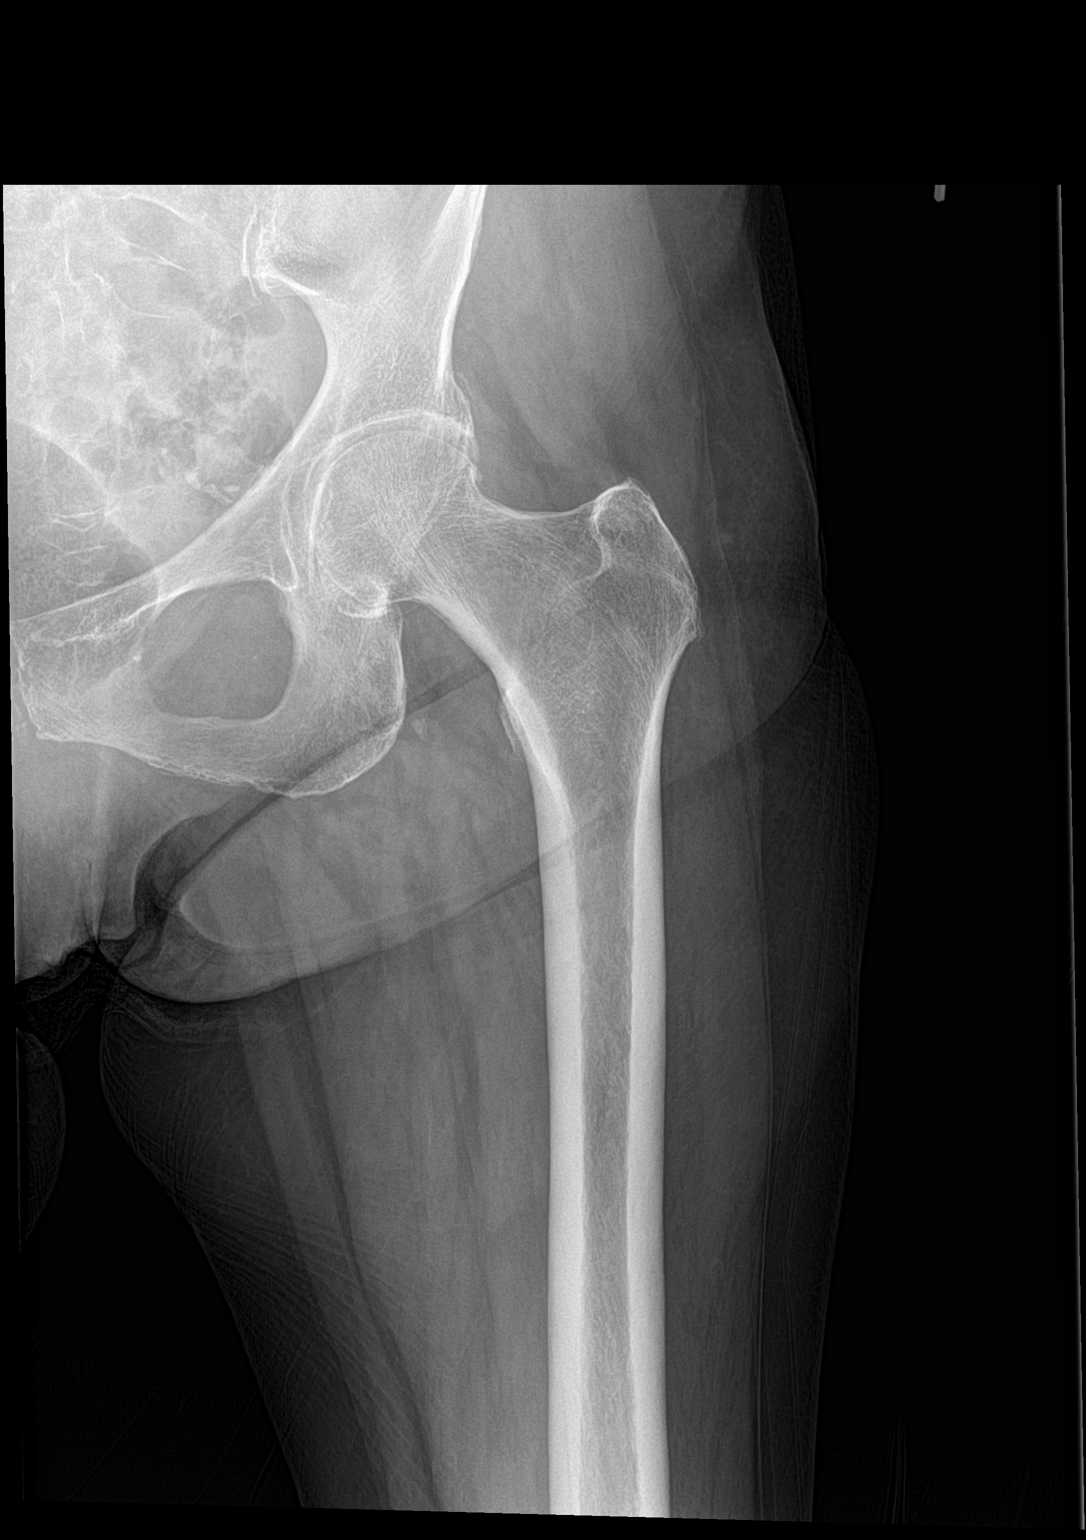
[im 2/4]
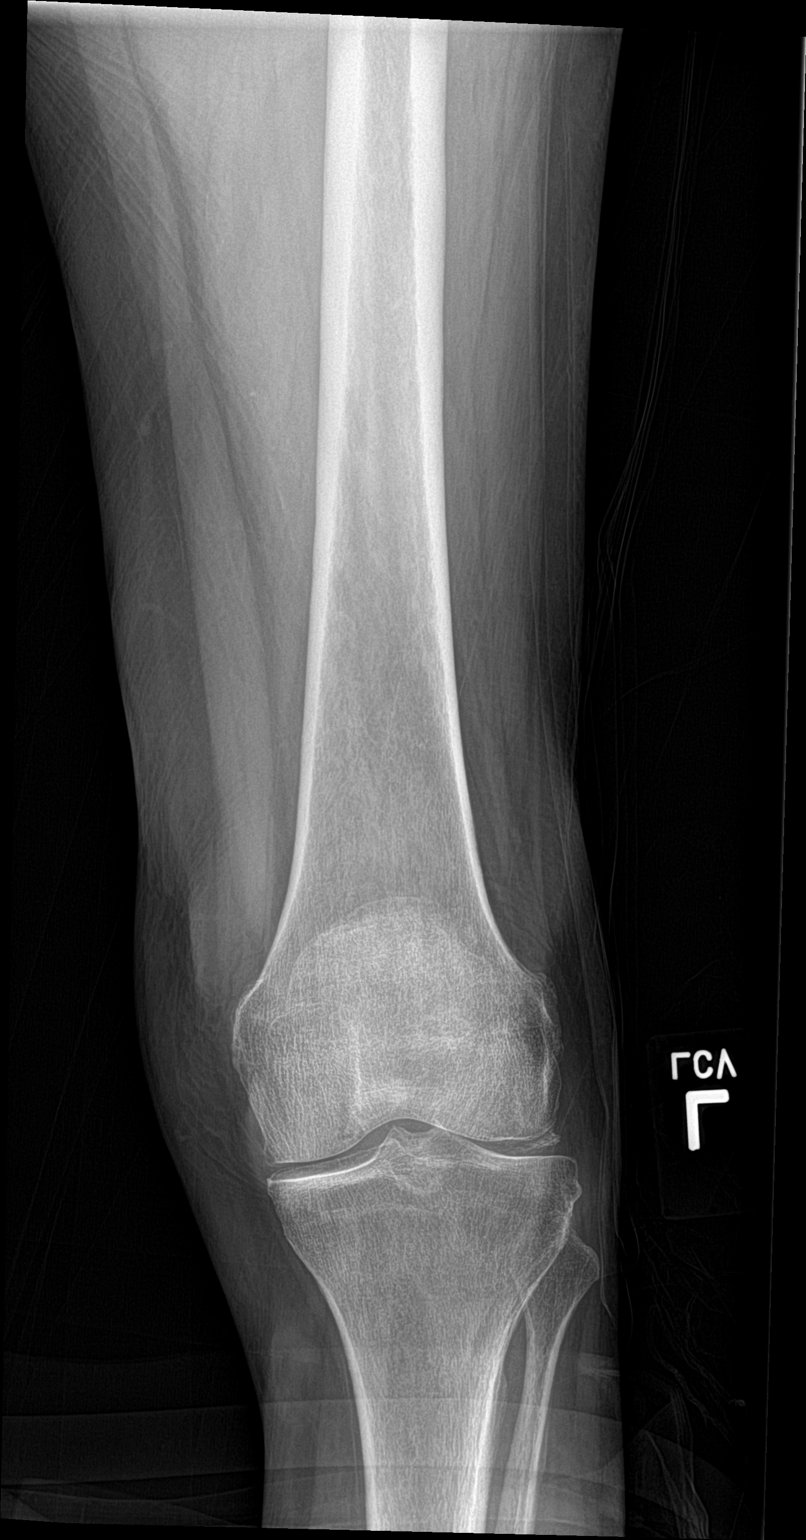
[im 3/4]
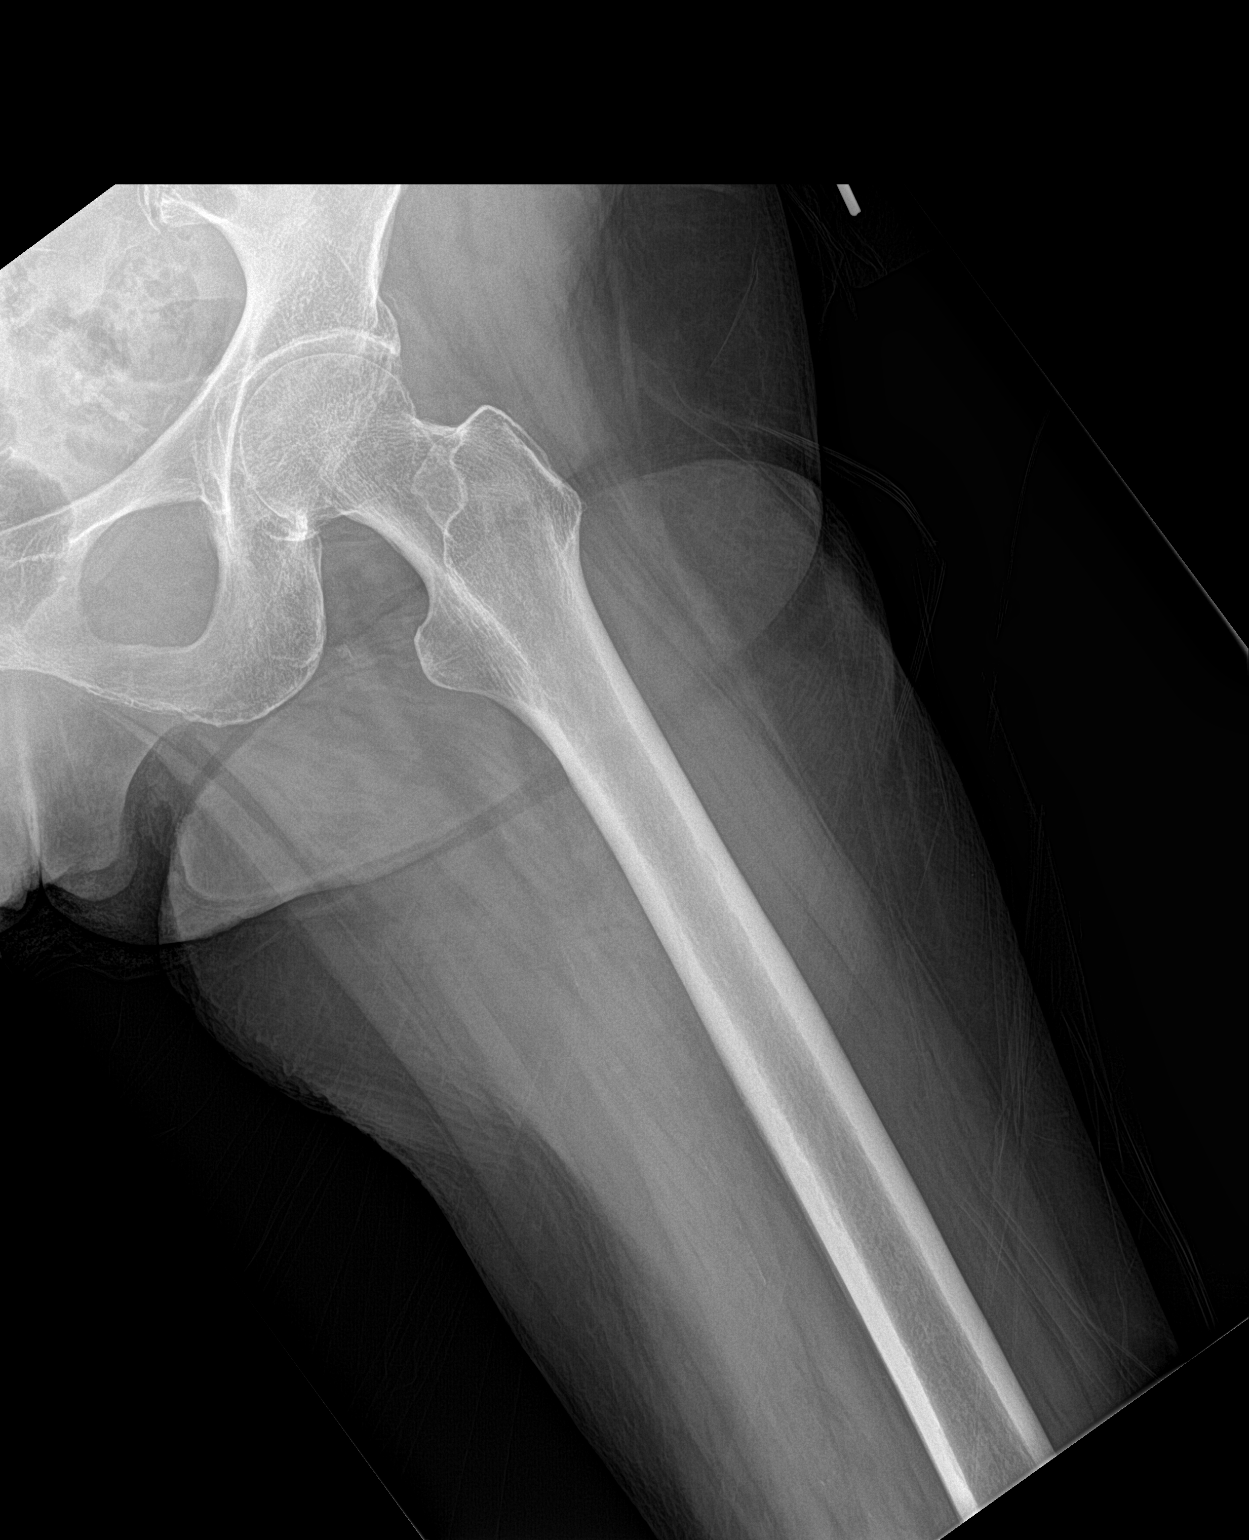
[im 4/4]
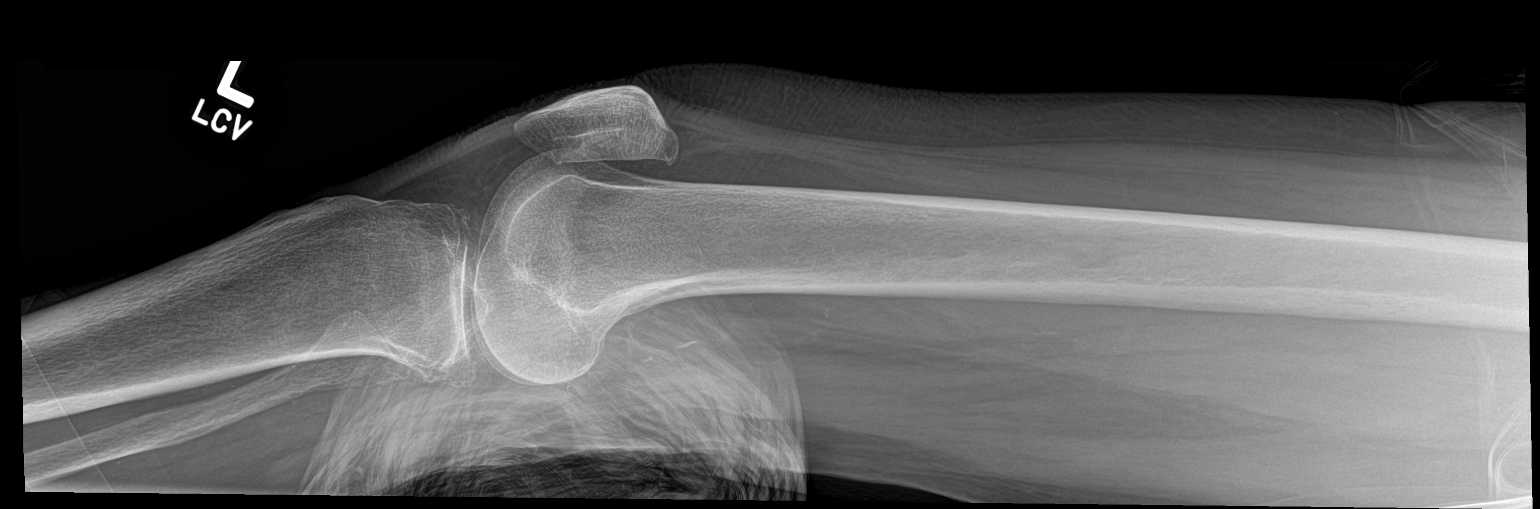

[4 of 4 positions shown; findings below may reference images not displayed]

FINDINGS: No acute fracture or dislocation. Mild osteoarthritis of the left
hip. No lytic or sclerotic osseous lesion. Generalized osteopenia.
Chondrocalcinosis of the lateral femorotibial compartment as can be
seen with CPPD. No knee joint effusion.
IMPRESSION: No acute osseous injury of the left femur.

## 2017-10-21 ENCOUNTER — Other Ambulatory Visit: Payer: Self-pay | Admitting: *Deleted

## 2017-10-21 MED ORDER — PANTOPRAZOLE SODIUM 40 MG PO TBEC: 40.0000 mg | DELAYED_RELEASE_TABLET | Freq: Every day | ORAL | 1 refills | Status: DC

## 2017-11-01 ENCOUNTER — Encounter (INDEPENDENT_AMBULATORY_CARE_PROVIDER_SITE_OTHER): Payer: Medicare Other | Admitting: Ophthalmology

## 2017-11-01 DIAGNOSIS — H3509 Other intraretinal microvascular abnormalities: Secondary | ICD-10-CM

## 2017-11-01 DIAGNOSIS — H35033 Hypertensive retinopathy, bilateral: Secondary | ICD-10-CM

## 2017-11-01 DIAGNOSIS — E11311 Type 2 diabetes mellitus with unspecified diabetic retinopathy with macular edema: Secondary | ICD-10-CM

## 2017-11-01 DIAGNOSIS — E113292 Type 2 diabetes mellitus with mild nonproliferative diabetic retinopathy without macular edema, left eye: Secondary | ICD-10-CM | POA: Diagnosis not present

## 2017-11-01 DIAGNOSIS — H43812 Vitreous degeneration, left eye: Secondary | ICD-10-CM

## 2017-11-01 DIAGNOSIS — I10 Essential (primary) hypertension: Secondary | ICD-10-CM

## 2017-11-01 DIAGNOSIS — E113311 Type 2 diabetes mellitus with moderate nonproliferative diabetic retinopathy with macular edema, right eye: Secondary | ICD-10-CM | POA: Diagnosis not present

## 2017-11-05 ENCOUNTER — Ambulatory Visit: Payer: Medicare Other

## 2017-11-08 ENCOUNTER — Telehealth: Payer: Self-pay | Admitting: Family Medicine

## 2017-11-08 DIAGNOSIS — I10 Essential (primary) hypertension: Secondary | ICD-10-CM

## 2017-11-08 DIAGNOSIS — E7800 Pure hypercholesterolemia, unspecified: Secondary | ICD-10-CM

## 2017-11-08 DIAGNOSIS — E1159 Type 2 diabetes mellitus with other circulatory complications: Secondary | ICD-10-CM

## 2017-11-08 DIAGNOSIS — E039 Hypothyroidism, unspecified: Secondary | ICD-10-CM

## 2017-11-08 DIAGNOSIS — E038 Other specified hypothyroidism: Secondary | ICD-10-CM

## 2017-11-08 NOTE — Telephone Encounter (Signed)
-----   Message from Eustace Pen, LPN sent at 01/17/7492  2:55 PM EDT ----- Regarding: Labs 6/18 Lab orders needed. Thank you.  Insurance:  Medicare   FYI: Labs on 4/19.

## 2017-11-09 ENCOUNTER — Ambulatory Visit (INDEPENDENT_AMBULATORY_CARE_PROVIDER_SITE_OTHER): Payer: Medicare Other

## 2017-11-09 VITALS — BP 118/70 | HR 61 | Temp 97.9°F | Ht 63.0 in | Wt 114.2 lb

## 2017-11-09 DIAGNOSIS — Z0000 Encounter for general adult medical examination without abnormal findings: Secondary | ICD-10-CM | POA: Diagnosis not present

## 2017-11-09 DIAGNOSIS — E7800 Pure hypercholesterolemia, unspecified: Secondary | ICD-10-CM

## 2017-11-09 DIAGNOSIS — E039 Hypothyroidism, unspecified: Secondary | ICD-10-CM | POA: Diagnosis not present

## 2017-11-09 DIAGNOSIS — I10 Essential (primary) hypertension: Secondary | ICD-10-CM

## 2017-11-09 DIAGNOSIS — E1159 Type 2 diabetes mellitus with other circulatory complications: Secondary | ICD-10-CM

## 2017-11-09 DIAGNOSIS — E038 Other specified hypothyroidism: Secondary | ICD-10-CM

## 2017-11-09 LAB — CBC WITH DIFFERENTIAL/PLATELET
Basophils Absolute: 0.1 10*3/uL (ref 0.0–0.1)
Basophils Relative: 0.9 % (ref 0.0–3.0)
EOS PCT: 1.8 % (ref 0.0–5.0)
Eosinophils Absolute: 0.2 10*3/uL (ref 0.0–0.7)
HCT: 43.4 % (ref 36.0–46.0)
HEMOGLOBIN: 14.4 g/dL (ref 12.0–15.0)
LYMPHS PCT: 13.7 % (ref 12.0–46.0)
Lymphs Abs: 1.6 10*3/uL (ref 0.7–4.0)
MCHC: 33.3 g/dL (ref 30.0–36.0)
MCV: 94.1 fl (ref 78.0–100.0)
MONOS PCT: 7 % (ref 3.0–12.0)
Monocytes Absolute: 0.8 10*3/uL (ref 0.1–1.0)
Neutro Abs: 8.8 10*3/uL — ABNORMAL HIGH (ref 1.4–7.7)
Neutrophils Relative %: 76.6 % (ref 43.0–77.0)
Platelets: 206 10*3/uL (ref 150.0–400.0)
RBC: 4.61 Mil/uL (ref 3.87–5.11)
RDW: 13.8 % (ref 11.5–15.5)
WBC: 11.5 10*3/uL — AB (ref 4.0–10.5)

## 2017-11-09 LAB — TSH: TSH: 3.92 u[IU]/mL (ref 0.35–4.50)

## 2017-11-09 LAB — COMPREHENSIVE METABOLIC PANEL
ALBUMIN: 4.2 g/dL (ref 3.5–5.2)
ALK PHOS: 62 U/L (ref 39–117)
ALT: 13 U/L (ref 0–35)
AST: 13 U/L (ref 0–37)
BUN: 22 mg/dL (ref 6–23)
CALCIUM: 10.3 mg/dL (ref 8.4–10.5)
CO2: 30 mEq/L (ref 19–32)
Chloride: 102 mEq/L (ref 96–112)
Creatinine, Ser: 0.83 mg/dL (ref 0.40–1.20)
GFR: 68.98 mL/min (ref >60.00)
Glucose, Bld: 133 mg/dL — ABNORMAL HIGH (ref 70–99)
Potassium: 4.3 mEq/L (ref 3.5–5.1)
Sodium: 140 mEq/L (ref 135–145)
TOTAL PROTEIN: 7.5 g/dL (ref 6.0–8.3)
Total Bilirubin: 0.7 mg/dL (ref 0.2–1.2)

## 2017-11-09 LAB — LIPID PANEL
CHOLESTEROL: 132 mg/dL (ref 0–200)
HDL: 44 mg/dL (ref >39.00)
LDL CALC: 57 mg/dL (ref 0–99)
NonHDL: 87.94
TRIGLYCERIDES: 156 mg/dL — AB (ref 0.0–149.0)
Total CHOL/HDL Ratio: 3
VLDL: 31.2 mg/dL (ref 0.0–40.0)

## 2017-11-09 LAB — MICROALBUMIN / CREATININE URINE RATIO
Creatinine,U: 63.8 mg/dL
Microalb Creat Ratio: 5.9 mg/g (ref 0.0–30.0)
Microalb, Ur: 3.8 mg/dL — ABNORMAL HIGH (ref 0.0–1.9)

## 2017-11-09 LAB — HEMOGLOBIN A1C: Hgb A1c MFr Bld: 7.2 % — ABNORMAL HIGH (ref 4.6–6.5)

## 2017-11-09 LAB — T4, FREE: Free T4: 0.95 ng/dL (ref 0.60–1.60)

## 2017-11-09 NOTE — Patient Instructions (Signed)
Lauren [name] , Thank you for taking time to come for your Medicare Wellness Visit. I appreciate your ongoing commitment to your health goals. Please review the following plan we discussed and let me know if I can assist you in the future.   These are the goals we discussed: Goals    . Patient Stated     Starting 11/09/2017, I will continue to take medications as prescribed.         This is a list of the screening recommended for you and due dates:  Health Maintenance  Topic Date Due  . Tetanus Vaccine  10/26/2024*  . Flu Shot  12/23/2017  . Mammogram  01/05/2018  . Hemoglobin A1C  05/11/2018  . Eye exam for diabetics  07/19/2018  . Complete foot exam   09/08/2018  . Urine Protein Check  11/10/2018  . DEXA scan (bone density measurement)  Completed  . Pneumonia vaccines  Completed  *Topic was postponed. The date shown is not the original due date.   Preventive Care for Adults  A healthy lifestyle and preventive care can promote health and wellness. Preventive health guidelines for adults include the following key practices.  . A routine yearly physical is a good way to check with your health care provider about your health and preventive screening. It is a chance to share any concerns and updates on your health and to receive a thorough exam.  . Visit your dentist for a routine exam and preventive care every 6 months. Brush your teeth twice a day and floss once a day. Good oral hygiene prevents tooth decay and gum disease.  . The frequency of eye exams is based on your age, health, family medical history, use  of contact lenses, and other factors. Follow your health care provider's recommendations for frequency of eye exams.  . Eat a healthy diet. Foods like vegetables, fruits, whole grains, low-fat dairy products, and lean protein foods contain the nutrients you need without too many calories. Decrease your intake of foods high in solid fats, added sugars, and salt. Eat the right  amount of calories for you. Get information about a proper diet from your health care provider, if necessary.  . Regular physical exercise is one of the most important things you can do for your health. Most adults should get at least 150 minutes of moderate-intensity exercise (any activity that increases your heart rate and causes you to sweat) each week. In addition, most adults need muscle-strengthening exercises on 2 or more days a week.  Silver Sneakers may be a benefit available to you. To determine eligibility, you may visit the website: www.silversneakers.com or contact program at [phone] Mon-Fri between 8AM-8PM.   . Maintain a healthy weight. The body mass index (BMI) is a screening tool to identify possible weight problems. It provides an estimate of body fat based on height and weight. Your health care provider can find your BMI and can help you achieve or maintain a healthy weight.   For adults 20 years and older: ? A BMI below 18.5 is considered underweight. ? A BMI of 18.5 to 24.9 is normal. ? A BMI of 25 to 29.9 is considered overweight. ? A BMI of 30 and above is considered obese.   . Maintain normal blood lipids and cholesterol levels by exercising and minimizing your intake of saturated fat. Eat a balanced diet with plenty of fruit and vegetables. Blood tests for lipids and cholesterol should begin at age 61 and be repeated  every 5 years. If your lipid or cholesterol levels are high, you are over 50, or you are at high risk for heart disease, you may need your cholesterol levels checked more frequently. Ongoing high lipid and cholesterol levels should be treated with medicines if diet and exercise are not working.  . If you smoke, find out from your health care provider how to quit. If you do not use tobacco, please do not start.  . If you choose to drink alcohol, please do not consume more than 2 drinks per day. One drink is considered to be 12 ounces (355 mL) of beer, 5  ounces (148 mL) of wine, or 1.5 ounces (44 mL) of liquor.  . If you are 65-97 years old, ask your health care provider if you should take aspirin to prevent strokes.  . Use sunscreen. Apply sunscreen liberally and repeatedly throughout the day. You should seek shade when your shadow is shorter than you. Protect yourself by wearing long sleeves, pants, a wide-brimmed hat, and sunglasses year round, whenever you are outdoors.  . Once a month, do a whole body skin exam, using a mirror to look at the skin on your back. Tell your health care provider of new moles, moles that have irregular borders, moles that are larger than a pencil eraser, or moles that have changed in shape or color.

## 2017-11-09 NOTE — Progress Notes (Signed)
PCP notes:   Health maintenance:  Microalbumin - completed A1C - completed  Abnormal screenings:   Hearing - failed  Hearing Screening   125Hz  250Hz  500Hz  1000Hz  2000Hz  3000Hz  4000Hz  6000Hz  8000Hz   Right ear:   0 0 40  0    Left ear:   40 40 40  40      Patient concerns:   Patient verbalized concerns with intermittent stomach pain. States PRN GERD medications are ineffective.   Nurse concerns:  None  Next PCP appt:   11/30/17 @ 0930  I reviewed health advisor's note, was available for consultation, and agree with documentation and plan. [name] MD

## 2017-11-09 NOTE — Progress Notes (Signed)
Subjective:   Lauren [name] is a 84 y.o. female who presents for Medicare Annual (Subsequent) preventive examination.  Review of Systems:  N/A Cardiac Risk Factors include: advanced age (>41men, >27 women);diabetes mellitus;dyslipidemia     Objective:     Vitals: BP 118/70 (BP Location: Right Arm, Patient Position: Sitting, Cuff Size: Normal)   Pulse 61   Temp 97.9 F (36.6 C) (Oral)   Ht 5\' 3"  (1.6 m) Comment: shoes  Wt 114 lb 4 oz (51.8 kg)   SpO2 95%   BMI 20.24 kg/m   Body mass index is 20.24 kg/m.  Advanced Directives 11/09/2017 11/03/2016 08/15/2016 02/28/2016 02/28/2016 01/16/2016 08/02/2015  Does Patient Have a Medical Advance Directive? Yes Yes No Yes No Yes Yes  Type of Paramedic of [place];Living will [place];Living will - Living will;Healthcare Power of [place];Living will [place];Living will  Does patient want to make changes to medical advance directive? - - - No - Patient declined - - No - Patient declined  Copy of [place] in Chart? No - copy requested No - copy requested - - - No - copy requested No - copy requested  Would patient like information on creating a medical advance directive? - - - - No - patient declined information - -  Pre-existing out of facility DNR order (yellow form or pink MOST form) - - - - - - -    Tobacco Social History   Tobacco Use  Smoking Status Never Smoker  Smokeless Tobacco Never Used     Counseling given: No   Clinical Intake:  Pre-visit preparation completed: Yes  Pain : No/denies pain Pain Score: 0-No pain     Nutritional Status: BMI of 19-24  Normal Nutritional Risks: None Diabetes: Yes CBG done?: No Did pt. bring in CBG monitor from home?: No  How often do you need to have someone help you when you read instructions, pamphlets, or other written materials from your doctor or pharmacy?: 1 -  Never What is the last grade level you completed in school?: Masters degree  Interpreter Needed?: No  Comments: pt lives alone with part-time caregivers Information entered by :: LPinson, LPN  Past Medical History:  Diagnosis Date  . Breast cancer ([place])    left breast  . Chronic constipation   . Chronic headaches   . Diabetes mellitus   . Fibrocystic breast   . Gastritis   . Gout    injection in the past  . Hyperlipidemia   . Melanoma in situ of left upper extremity ([place])   . Pelvic fracture ([place])   . TIA (transient ischemic attack) 08/2008   neg MRI/MRA and CT and carotid dopplers  . Venous insufficiency    Past Surgical History:  Procedure Laterality Date  . APPENDECTOMY    . ARM SKIN LESION BIOPSY / EXCISION Right 11/02/2016  . BREAST BIOPSY Left 10/18/2007  . BREAST CYST ASPIRATION    . BREAST EXCISIONAL BIOPSY Right    x2  . BREAST LUMPECTOMY  09/2007   with sentinel LN biopsy  . BREAST LUMPECTOMY     left  . EYE SURGERY     retinal, then cataract   Family History  Problem Relation Age of Onset  . Breast cancer Sister   . Stroke Sister   . Cancer Sister        breast  . Breast cancer Sister   .  Stroke Sister   . Cancer Sister        breast  . Stroke Mother   . Stroke Father   . Stroke Brother   . Cancer Brother        prostate  . Stroke Brother   . Stroke Sister   . CAD Sister    Social History   Socioeconomic History  . Marital status: Single    Spouse name: Not on file  . Number of children: Not on file  . Years of education: college  . Highest education level: Not on file  Occupational History  . Not on file  Social Needs  . Financial resource strain: Not on file  . Food insecurity:    Worry: Not on file    Inability: Not on file  . Transportation needs:    Medical: Not on file    Non-medical: Not on file  Tobacco Use  . Smoking status: Never Smoker  . Smokeless tobacco: Never Used  Substance and Sexual Activity  . Alcohol use:  No    Alcohol/week: 0.0 oz  . Drug use: No  . Sexual activity: Not Currently  Lifestyle  . Physical activity:    Days per week: Not on file    Minutes per session: Not on file  . Stress: Not on file  Relationships  . Social connections:    Talks on phone: Not on file    Gets together: Not on file    Attends religious service: Not on file    Active member of club or organization: Not on file    Attends meetings of clubs or organizations: Not on file    Relationship status: Not on file  Other Topics Concern  . Not on file  Social History Narrative   Lives with and cares for both sisters   Strong faith   Denies caffeine use     Outpatient Encounter Medications as of 11/09/2017  Medication Sig  . acetaminophen (TYLENOL) 325 MG tablet Take 2 tablets (650 mg total) by mouth every 6 (six) hours as needed for mild pain (or Fever >/= 101).  Marland Kitchen Besifloxacin HCl (BESIVANCE) 0.6 % SUSP Apply 1 drop to eye 4 (four) times daily. PLACE 1 DROP INTO THE RIGHT EYE FOUR TIMES A DAY FOR 2 DAYS FOLLOWING EACH EYE  . calcium carbonate (TUMS - DOSED IN MG ELEMENTAL CALCIUM) 500 MG chewable tablet Chew 1 tablet by mouth as needed for heartburn.   . citalopram (CELEXA) 10 MG tablet Take 0.5 tablets (5 mg total) by mouth daily.  . clopidogrel (PLAVIX) 75 MG tablet TAKE ONE TABLET BY MOUTH EVERY MORNING WITH BREAKFAST.  . fluticasone (FLONASE) 50 MCG/ACT nasal spray Place into both nostrils daily.  Marland Kitchen glucose blood (ONE TOUCH ULTRA TEST) test strip USE AS DIRECTED TO CHECK BLOOD SUGAR ONCE A DAY AND AS NEEDED (DX. E11.9)  . metFORMIN (GLUCOPHAGE) 500 MG tablet Take 0.5 tablets (250 mg total) by mouth 2 (two) times daily with a meal.  . Multiple Vitamins-Minerals (PRESERVISION AREDS 2 PO) Take 1 tablet by mouth 2 (two) times daily.  . pantoprazole (PROTONIX) 40 MG tablet Take 1 tablet (40 mg total) by mouth daily.  . polyethylene glycol powder (GLYCOLAX/MIRALAX) powder DISSOLVE ONE (1) CAPFUL (17GM) INTO 4 TO8  OUNCES OF FLUID AND TAKE BYMOUTH ONCE DAILY AS DIRECTED  . ranitidine (ZANTAC) 300 MG tablet Take 1 tablet (300 mg total) by mouth daily as needed for heartburn.  . simethicone (MYLICON) 80 MG  chewable tablet Chew 80 mg by mouth every 6 (six) hours as needed for flatulence.  . simvastatin (ZOCOR) 20 MG tablet Take 1 tablet (20 mg total) by mouth at bedtime.   No facility-administered encounter medications on file as of 11/09/2017.     Activities of Daily Living In your present state of health, do you have any difficulty performing the following activities: 11/09/2017  Hearing? N  Vision? Y  Difficulty concentrating or making decisions? N  Walking or climbing stairs? Y  Dressing or bathing? Y  Doing errands, shopping? Y  Preparing Food and eating ? Y  Using the Toilet? N  In the past six months, have you accidently leaked urine? Y  Do you have problems with loss of bowel control? Y  Managing your Medications? N  Managing your Finances? N  Housekeeping or managing your Housekeeping? Y  Some recent data might be hidden    Patient Care Team: Tower, Wynelle Fanny, MD as PCP - General (Family Medicine) Hayden Pedro, MD as Consulting Physician (Ophthalmology) Marygrace Drought, MD as Consulting Physician (Ophthalmology) Rolm Bookbinder, MD as Consulting Physician (Dermatology) Carloyn Manner, MD as Referring Physician (Otolaryngology) Nicholas Lose, MD as Consulting Physician (Hematology and Oncology) Wallene Huh, DPM as Consulting Physician (Podiatry)    Assessment:   This is a routine wellness examination for [place].   Hearing Screening   125Hz  250Hz  500Hz  1000Hz  2000Hz  3000Hz  4000Hz  6000Hz  8000Hz   Right ear:   0 0 40  0    Left ear:   40 40 40  40    Vision Screening Comments: November 01, 2017 with Dr. Zigmund Daniel; frequent injections due to macular degeneration/Dr. Satira Sark is ophthalmologist    Exercise Activities and Dietary recommendations Current Exercise Habits: The patient  does not participate in regular exercise at present, Exercise limited by: orthopedic condition(s)  Goals    . Patient Stated     Starting 11/09/2017, I will continue to take medications as prescribed.         Fall Risk Fall Risk  11/09/2017 11/03/2016 09/23/2016 03/19/2016 08/02/2015  Falls in the past year? No Yes Yes No No  Number falls in past yr: - 1 1 - -  Injury with Fall? - Yes No - -  Risk for fall due to : - - - - -  Follow up - - Falls prevention discussed - -   Depression Screen PHQ 2/9 Scores 11/09/2017 11/03/2016 08/02/2015 05/01/2014  PHQ - 2 Score 0 0 0 0  PHQ- 9 Score 0 - - -     Cognitive Function MMSE - Mini Mental State Exam 11/09/2017 11/03/2016 08/02/2015  Orientation to time 5 5 5   Orientation to Place 5 5 5   Registration 3 3 3   Attention/ Calculation 0 0 5  Recall 3 3 3   Language- name 2 objects 0 0 0  Language- repeat 1 1 1   Language- follow 3 step command 3 3 3   Language- read & follow direction 0 0 1  Write a sentence 0 0 0  Copy design 0 0 0  Total score 20 20 26      PLEASE NOTE: A Mini-Cog screen was completed. Maximum score is 20. A value of 0 denotes this part of [place] MMSE was not completed or the patient failed this part of the Mini-Cog screening.   Mini-Cog Screening Orientation to Time - Max 5 pts Orientation to Place - Max 5 pts Registration - Max 3 pts Recall - Max 3 pts Language  Repeat - Max 1 pts Language Follow 3 Step Command - Max 3 pts     Immunization History  Administered Date(s) Administered  . Influenza Split 03/03/2011, 03/17/2012  . Influenza Whole 05/25/2005, 02/28/2009, 02/20/2010  . Influenza,inj,Quad PF,6+ Mos 02/16/2013, 02/07/2014, 02/04/2015, 02/05/2016, 03/03/2017  . Pneumococcal Conjugate-13 05/01/2014  . Pneumococcal Polysaccharide-23 10/18/2008  . Td 10/27/2004  . Zoster 01/13/2013   Screening Tests Health Maintenance  Topic Date Due  . TETANUS/TDAP  10/26/2024 (Originally 10/28/2014)  . INFLUENZA VACCINE   12/23/2017  . MAMMOGRAM  01/05/2018  . HEMOGLOBIN A1C  05/11/2018  . OPHTHALMOLOGY EXAM  07/19/2018  . FOOT EXAM  09/08/2018  . URINE MICROALBUMIN  11/10/2018  . DEXA SCAN  Completed  . PNA vac Low Risk Adult  Completed      Plan:     I have personally reviewed, addressed, and noted the following in the patient's chart:  A. Medical and social history B. Use of alcohol, tobacco or illicit drugs  C. Current medications and supplements D. Functional ability and status E.  Nutritional status F.  Physical activity G. Advance directives H. List of other physicians I.  Hospitalizations, surgeries, and ER visits in previous 12 months J.  [place] to include hearing, vision, cognitive, depression L. Referrals and appointments - none  In addition, I have reviewed and discussed with patient certain preventive protocols, quality metrics, and best practice recommendations. A written personalized care plan for preventive services as well as general preventive health recommendations were provided to patient.  See attached scanned questionnaire for additional information.   Signed,   Lindell Noe, MHA, BS, LPN Health Coach

## 2017-11-12 ENCOUNTER — Encounter: Payer: Medicare Other | Admitting: Family Medicine

## 2017-11-15 ENCOUNTER — Encounter (INDEPENDENT_AMBULATORY_CARE_PROVIDER_SITE_OTHER): Payer: Medicare Other | Admitting: Ophthalmology

## 2017-11-15 DIAGNOSIS — H3509 Other intraretinal microvascular abnormalities: Secondary | ICD-10-CM | POA: Diagnosis not present

## 2017-11-15 DIAGNOSIS — E11311 Type 2 diabetes mellitus with unspecified diabetic retinopathy with macular edema: Secondary | ICD-10-CM | POA: Diagnosis not present

## 2017-11-15 DIAGNOSIS — E113313 Type 2 diabetes mellitus with moderate nonproliferative diabetic retinopathy with macular edema, bilateral: Secondary | ICD-10-CM | POA: Diagnosis not present

## 2017-11-19 ENCOUNTER — Other Ambulatory Visit: Payer: Self-pay | Admitting: *Deleted

## 2017-11-19 MED ORDER — CITALOPRAM HYDROBROMIDE 10 MG PO TABS: 5.0000 mg | ORAL_TABLET | Freq: Every day | ORAL | 11 refills | Status: DC

## 2017-11-26 ENCOUNTER — Encounter: Payer: Medicare Other | Admitting: Family Medicine

## 2017-11-29 ENCOUNTER — Encounter: Payer: Medicare Other | Admitting: Family Medicine

## 2017-11-30 ENCOUNTER — Other Ambulatory Visit: Payer: Self-pay | Admitting: Family Medicine

## 2017-11-30 ENCOUNTER — Ambulatory Visit (INDEPENDENT_AMBULATORY_CARE_PROVIDER_SITE_OTHER): Payer: Medicare Other | Admitting: Family Medicine

## 2017-11-30 ENCOUNTER — Encounter: Payer: Self-pay | Admitting: Family Medicine

## 2017-11-30 ENCOUNTER — Telehealth: Payer: Self-pay | Admitting: Family Medicine

## 2017-11-30 VITALS — BP 130/70 | HR 84 | Temp 98.0°F | Ht 63.0 in | Wt 113.2 lb

## 2017-11-30 DIAGNOSIS — M8580 Other specified disorders of bone density and structure, unspecified site: Secondary | ICD-10-CM | POA: Diagnosis not present

## 2017-11-30 DIAGNOSIS — E7800 Pure hypercholesterolemia, unspecified: Secondary | ICD-10-CM | POA: Diagnosis not present

## 2017-11-30 DIAGNOSIS — D72829 Elevated white blood cell count, unspecified: Secondary | ICD-10-CM | POA: Insufficient documentation

## 2017-11-30 DIAGNOSIS — E039 Hypothyroidism, unspecified: Secondary | ICD-10-CM | POA: Diagnosis not present

## 2017-11-30 DIAGNOSIS — E1159 Type 2 diabetes mellitus with other circulatory complications: Secondary | ICD-10-CM | POA: Diagnosis not present

## 2017-11-30 DIAGNOSIS — E2839 Other primary ovarian failure: Secondary | ICD-10-CM

## 2017-11-30 DIAGNOSIS — E038 Other specified hypothyroidism: Secondary | ICD-10-CM

## 2017-11-30 DIAGNOSIS — I639 Cerebral infarction, unspecified: Secondary | ICD-10-CM

## 2017-11-30 DIAGNOSIS — R8290 Unspecified abnormal findings in urine: Secondary | ICD-10-CM

## 2017-11-30 DIAGNOSIS — K219 Gastro-esophageal reflux disease without esophagitis: Secondary | ICD-10-CM

## 2017-11-30 DIAGNOSIS — Z1231 Encounter for screening mammogram for malignant neoplasm of breast: Secondary | ICD-10-CM

## 2017-11-30 DIAGNOSIS — D72825 Bandemia: Secondary | ICD-10-CM

## 2017-11-30 DIAGNOSIS — R140 Abdominal distension (gaseous): Secondary | ICD-10-CM

## 2017-11-30 DIAGNOSIS — I10 Essential (primary) hypertension: Secondary | ICD-10-CM | POA: Diagnosis not present

## 2017-11-30 LAB — POC URINALSYSI DIPSTICK (AUTOMATED)
BILIRUBIN UA: NEGATIVE
GLUCOSE UA: NEGATIVE
KETONES UA: NEGATIVE
Nitrite, UA: NEGATIVE
PH UA: 6 (ref 5.0–8.0)
Protein, UA: NEGATIVE
RBC UA: NEGATIVE
SPEC GRAV UA: 1.02 (ref 1.010–1.025)
Urobilinogen, UA: 0.2 E.U./dL

## 2017-11-30 MED ORDER — SIMVASTATIN 20 MG PO TABS: 20.0000 mg | ORAL_TABLET | Freq: Every day | ORAL | 3 refills | Status: DC

## 2017-11-30 MED ORDER — CLOPIDOGREL BISULFATE 75 MG PO TABS: ORAL_TABLET | ORAL | 3 refills | Status: DC

## 2017-11-30 MED ORDER — RANITIDINE HCL 300 MG PO TABS: 300.0000 mg | ORAL_TABLET | Freq: Every day | ORAL | 3 refills | Status: DC | PRN

## 2017-11-30 MED ORDER — METFORMIN HCL 500 MG PO TABS: 250.0000 mg | ORAL_TABLET | Freq: Two times a day (BID) | ORAL | 3 refills | Status: DC

## 2017-11-30 MED ORDER — CITALOPRAM HYDROBROMIDE 10 MG PO TABS: 5.0000 mg | ORAL_TABLET | Freq: Every day | ORAL | 3 refills | Status: DC

## 2017-11-30 MED ORDER — PANTOPRAZOLE SODIUM 40 MG PO TBEC: 40.0000 mg | DELAYED_RELEASE_TABLET | Freq: Every day | ORAL | 3 refills | Status: DC

## 2017-11-30 NOTE — Progress Notes (Signed)
Subjective:    Patient ID: Lauren [name], female    DOB: 15-Aug-1927, 84 y.o.   MRN: [id number]  HPI Here for annual f/u for chronic health problems   Has been keeping busy with church and visiting friends  Feeling ok  Trying to get out of the house   Wt Readings from Last 3 Encounters:  11/30/17 113 lb 4 oz (51.4 kg)  11/09/17 114 lb 4 oz (51.8 kg)  09/07/17 118 lb 4 oz (53.6 kg)  not eating less but probably a bit more active (not enough)  Did cut down on fried foods  20.06 kg/m   Had amw 6/18 Hearing loss in R ear - wonders if she has wax in it  No concerns  Mammogram 8/18 - will set that up herself  Personal hx of breast cancer (oncology signed off)  Self breast exam -no changes   Eye exam 2/19 -retinopathy/ following  Getting injections   dexa 1/16-osteopenia femara and evista in the past  H/o pelvic fx Wants to do dexa   Past age for colon cancer screening   zostavax 8/14  bp is stable today  No cp or palpitations or headaches or edema  No side effects to medicines  BP Readings from Last 3 Encounters:  11/30/17 130/70  11/09/17 118/70  09/07/17 122/68     DM2 Lab Results  Component Value Date   HGBA1C 7.2 (H) 11/09/2017  up from 7.1  Lab Results  Component Value Date   MICROALBUR 3.8 (H) 11/09/2017  one glucose 132 - highest (after cake)  No lows   Lab Results  Component Value Date   CREATININE 0.83 11/09/2017   BUN 22 11/09/2017   NA 140 11/09/2017   K 4.3 11/09/2017   CL 102 11/09/2017   CO2 30 11/09/2017   Lab Results  Component Value Date   ALT 13 11/09/2017   AST 13 11/09/2017   ALKPHOS 62 11/09/2017   BILITOT 0.7 11/09/2017      Subclinical hypothyroidism Lab Results  Component Value Date   TSH 3.92 11/09/2017    Nl free T4 as well    Hyperlipidemia  Lab Results  Component Value Date   CHOL 132 11/09/2017   CHOL 134 08/31/2017   CHOL 152 02/23/2017   Lab Results  Component Value Date   HDL 44.00 11/09/2017   HDL 39.00 (L) 08/31/2017   HDL 34.00 (L) 02/23/2017   Lab Results  Component Value Date   LDLCALC 57 11/09/2017   LDLCALC 66 08/31/2017   LDLCALC 71 08/16/2016   Lab Results  Component Value Date   TRIG 156.0 (H) 11/09/2017   TRIG 147.0 08/31/2017   TRIG 203.0 (H) 02/23/2017   Lab Results  Component Value Date   CHOLHDL 3 11/09/2017   CHOLHDL 3 08/31/2017   CHOLHDL 4 02/23/2017   Lab Results  Component Value Date   LDLDIRECT 49.0 02/23/2017   LDLDIRECT 76.2 04/24/2014   LDLDIRECT 46.3 10/01/2009   Simvastatin and diet   Lab Results  Component Value Date   WBC 11.5 (H) 11/09/2017   HGB 14.4 11/09/2017   HCT 43.4 11/09/2017   MCV 94.1 11/09/2017   PLT 206.0 11/09/2017   wbc up a little Has had a lot of stress   (niece has been coming over to clean-that makes her anxious) No infections or symptoms   Takes celexa 10 mg  Helps her anxiety   Review of Systems     Objective:  Physical Exam  Constitutional: She appears well-developed and well-nourished. No distress.  Frail appearing elderly female  HENT:  Head: Normocephalic and atraumatic.  Right Ear: External ear normal.  Left Ear: External ear normal.  Mouth/Throat: Oropharynx is clear and moist.  Eyes: Pupils are equal, round, and reactive to light. Conjunctivae and EOM are normal. No scleral icterus.  Neck: Normal range of motion. Neck supple. No JVD present. Carotid bruit is not present. No thyromegaly present.  Cardiovascular: Normal rate, regular rhythm, normal heart sounds and intact distal pulses. Exam reveals no gallop.  Pulmonary/Chest: Effort normal and breath sounds normal. No respiratory distress. She has no wheezes. She exhibits no tenderness. No breast tenderness, discharge or bleeding.  Abdominal: Soft. Bowel sounds are normal. She exhibits no distension, no abdominal bruit and no mass. There is no tenderness.  Genitourinary:  Genitourinary Comments: Breast exam: No mass, nodules, thickening,  tenderness, bulging, retraction, inflamation, nipple discharge or skin changes noted.  No axillary or clavicular LA.     Surgical changes from prior breast cancer noted   Musculoskeletal: Normal range of motion. She exhibits no edema or tenderness.  Mild kyphosis   Lymphadenopathy:    She has no cervical adenopathy.  Neurological: She is alert. She displays normal reflexes. No cranial nerve deficit. She exhibits normal muscle tone. Coordination normal.  Skin: Skin is warm and dry. No rash noted. No erythema. No pallor.  Brown macule still present on sole of R foot-unchanged  Per pt-has been worked up by derm  Diffuse sks   Psychiatric: Her mood appears anxious.  Pleasant  Mildly anxious           Assessment & Plan:   Problem List Items Addressed This Visit      Cardiovascular and Mediastinum   HTN (hypertension)    bp in fair control at this time  BP Readings from Last 1 Encounters:  11/30/17 130/70   No changes needed Most recent labs reviewed  Disc lifstyle change with low sodium diet and exercise        Relevant Medications   simvastatin (ZOCOR) 20 MG tablet     Digestive   GERD (gastroesophageal reflux disease)    Ongoing bloating/ occ discomfort  Per caregiver  ? Worse  Is chronic  Declines GI appt at this time (Dr Sarina Ser in the past)  Stressed imp of further eval if symptoms worsen-esp if she continues to loose wt      Relevant Medications   pantoprazole (PROTONIX) 40 MG tablet   ranitidine (ZANTAC) 300 MG tablet     Endocrine   Diabetes mellitus type II, controlled ([place]) - Primary    Lab Results  Component Value Date   HGBA1C 7.2 (H) 11/09/2017   Stable  No low glucose levels Disc foot/eye care  Rev microalb      Relevant Medications   metFORMIN (GLUCOPHAGE) 500 MG tablet   simvastatin (ZOCOR) 20 MG tablet   Subclinical hypothyroidism    Lab Results  Component Value Date   TSH 3.92 11/09/2017    Nl symptoms  Continue to watch Also nl  FT4        Musculoskeletal and Integument   Osteopenia    Due for dexa Ref made Remote hx of pelvic fx  femara and evista in the past  On ca/D        Other   Estrogen deficiency   Relevant Orders   DG Bone Density   HYPERCHOLESTEROLEMIA, PURE    Disc goals  for lipids and reasons to control them Rev last labs with pt Rev low sat fat diet in detail  Continue simvastatin       Relevant Medications   simvastatin (ZOCOR) 20 MG tablet   Leukocytosis    Mild  UA and cx today  No symptoms       Relevant Orders   POCT Urinalysis Dipstick (Automated) (Completed)   Urine Culture    Other Visit Diagnoses    Abnormal urinalysis       Relevant Orders   Urine Culture

## 2017-11-30 NOTE — Assessment & Plan Note (Signed)
Due for dexa Ref made Remote hx of pelvic fx  femara and evista in the past  On ca/D

## 2017-11-30 NOTE — Assessment & Plan Note (Signed)
Lab Results  Component Value Date   TSH 3.92 11/09/2017    Nl symptoms  Continue to watch Also nl FT4

## 2017-11-30 NOTE — Patient Instructions (Addendum)
Weight is down -make sure you are having protein with every meal   Don't forget to schedule your mammogram in august at the [facility]    We will set up a bone density test   Urinalysis today - we will do a urine culture if anything looks abnormal   If you want to see a GI specialist for your stomach problems please call and let us know

## 2017-11-30 NOTE — Telephone Encounter (Signed)
Copied from [place] [id number]. Topic: Quick Communication - See Telephone Encounter >> Nov 30, 2017  3:15 PM Neva Seat wrote: Needing to have an appt w/ Dr. Watt Climes GI specialist -[phone].

## 2017-11-30 NOTE — Assessment & Plan Note (Signed)
Ongoing bloating/ occ discomfort  Per caregiver  ? Worse  Is chronic  Declines GI appt at this time (Dr Sarina Ser in the past)  Stressed imp of further eval if symptoms worsen-esp if she continues to loose wt

## 2017-11-30 NOTE — Assessment & Plan Note (Signed)
Lab Results  Component Value Date   HGBA1C 7.2 (H) 11/09/2017   Stable  No low glucose levels Disc foot/eye care  Rev microalb

## 2017-11-30 NOTE — Telephone Encounter (Signed)
Pt seen earlier today and advised if wants GI referral to cb.Please advise.

## 2017-11-30 NOTE — Telephone Encounter (Signed)
I did the referral   Will route to [facility] Thanks

## 2017-11-30 NOTE — Assessment & Plan Note (Signed)
Disc goals for lipids and reasons to control them Rev last labs with pt Rev low sat fat diet in detail  Continue simvastatin

## 2017-11-30 NOTE — Assessment & Plan Note (Signed)
bp in fair control at this time  BP Readings from Last 1 Encounters:  11/30/17 130/70   No changes needed Most recent labs reviewed  Disc lifstyle change with low sodium diet and exercise

## 2017-11-30 NOTE — Assessment & Plan Note (Signed)
Mild  UA and cx today  No symptoms

## 2017-12-02 ENCOUNTER — Telehealth: Payer: Self-pay | Admitting: Family Medicine

## 2017-12-02 LAB — URINE CULTURE
MICRO NUMBER: 90811325
SPECIMEN QUALITY:: ADEQUATE

## 2017-12-02 NOTE — Telephone Encounter (Signed)
Apt made and patient aware.

## 2017-12-02 NOTE — Telephone Encounter (Signed)
Pt's niece [name] (on [place]) given results per notes of Dr Glori Bickers on 12/02/17. Unable to document in result note due to result note not being routed to [facility].

## 2017-12-03 ENCOUNTER — Telehealth: Payer: Self-pay | Admitting: Family Medicine

## 2017-12-03 ENCOUNTER — Telehealth: Payer: Self-pay | Admitting: *Deleted

## 2017-12-03 MED ORDER — CEPHALEXIN 250 MG PO CAPS: 250.0000 mg | ORAL_CAPSULE | Freq: Two times a day (BID) | ORAL | 0 refills | Status: DC

## 2017-12-03 NOTE — Telephone Encounter (Signed)
I have addressed with pt directly and sent med before this phone note was routed to me

## 2017-12-03 NOTE — Telephone Encounter (Signed)
-----   Message from Abner Greenspan, MD sent at 12/02/2017  7:45 PM EDT ----- Please let pt know urine is pos for e coli infection  Please send in keflex 250 mg 1 po bid for 7 d # 14 no refills  Drink more water as well

## 2017-12-03 NOTE — Telephone Encounter (Signed)
Copied from [place] [id number]. Topic: Quick Communication - See Telephone Encounter >> Dec 03, 2017 12:52 PM Percell Belt A wrote: CRM for notification. See Telephone encounter for: 12/03/17. [name] called in and would like to know if culture is back to call in meds for uti?  She stated this culture was done tues?  Pt is still not feeling any better and would like meds as soon has possible?  Please advise   [phone]- linda

## 2017-12-24 DIAGNOSIS — Z85828 Personal history of other malignant neoplasm of skin: Secondary | ICD-10-CM | POA: Diagnosis not present

## 2017-12-24 DIAGNOSIS — L820 Inflamed seborrheic keratosis: Secondary | ICD-10-CM | POA: Diagnosis not present

## 2017-12-24 DIAGNOSIS — I788 Other diseases of capillaries: Secondary | ICD-10-CM | POA: Diagnosis not present

## 2017-12-24 DIAGNOSIS — L821 Other seborrheic keratosis: Secondary | ICD-10-CM | POA: Diagnosis not present

## 2018-01-05 ENCOUNTER — Other Ambulatory Visit: Payer: Self-pay | Admitting: Gastroenterology

## 2018-01-05 DIAGNOSIS — R634 Abnormal weight loss: Secondary | ICD-10-CM | POA: Diagnosis not present

## 2018-01-05 DIAGNOSIS — R1032 Left lower quadrant pain: Secondary | ICD-10-CM

## 2018-01-05 DIAGNOSIS — R140 Abdominal distension (gaseous): Secondary | ICD-10-CM

## 2018-01-10 ENCOUNTER — Encounter (INDEPENDENT_AMBULATORY_CARE_PROVIDER_SITE_OTHER): Payer: Medicare Other | Admitting: Ophthalmology

## 2018-01-10 DIAGNOSIS — E11311 Type 2 diabetes mellitus with unspecified diabetic retinopathy with macular edema: Secondary | ICD-10-CM

## 2018-01-10 DIAGNOSIS — E113211 Type 2 diabetes mellitus with mild nonproliferative diabetic retinopathy with macular edema, right eye: Secondary | ICD-10-CM | POA: Diagnosis not present

## 2018-01-10 DIAGNOSIS — E113292 Type 2 diabetes mellitus with mild nonproliferative diabetic retinopathy without macular edema, left eye: Secondary | ICD-10-CM

## 2018-01-10 DIAGNOSIS — H43812 Vitreous degeneration, left eye: Secondary | ICD-10-CM

## 2018-01-10 DIAGNOSIS — H353122 Nonexudative age-related macular degeneration, left eye, intermediate dry stage: Secondary | ICD-10-CM | POA: Diagnosis not present

## 2018-01-14 ENCOUNTER — Ambulatory Visit
Admission: RE | Admit: 2018-01-14 | Discharge: 2018-01-14 | Disposition: A | Discharge status: Home / Self Care | Payer: Medicare Other | Source: Ambulatory Visit | Attending: Family Medicine | Admitting: Family Medicine

## 2018-01-14 DIAGNOSIS — Z1231 Encounter for screening mammogram for malignant neoplasm of breast: Secondary | ICD-10-CM

## 2018-01-14 DIAGNOSIS — E2839 Other primary ovarian failure: Secondary | ICD-10-CM

## 2018-01-14 DIAGNOSIS — M8589 Other specified disorders of bone density and structure, multiple sites: Secondary | ICD-10-CM | POA: Diagnosis not present

## 2018-01-17 ENCOUNTER — Ambulatory Visit
Admission: RE | Admit: 2018-01-17 | Discharge: 2018-01-17 | Disposition: A | Discharge status: Home / Self Care | Payer: Medicare Other | Source: Ambulatory Visit | Attending: Gastroenterology | Admitting: Gastroenterology

## 2018-01-17 ENCOUNTER — Encounter: Payer: Self-pay | Admitting: *Deleted

## 2018-01-17 DIAGNOSIS — R1032 Left lower quadrant pain: Secondary | ICD-10-CM

## 2018-01-17 DIAGNOSIS — R140 Abdominal distension (gaseous): Secondary | ICD-10-CM

## 2018-01-17 DIAGNOSIS — R634 Abnormal weight loss: Secondary | ICD-10-CM

## 2018-01-17 MED ORDER — IOPAMIDOL (ISOVUE-300) INJECTION 61%
100.0000 mL | Freq: Once | INTRAVENOUS | Status: AC | PRN
  Administered 2018-01-17: 100 mL via INTRAVENOUS

## 2018-02-08 ENCOUNTER — Telehealth: Payer: Self-pay | Admitting: Neurology

## 2018-02-08 NOTE — Telephone Encounter (Signed)
Pts niece [name] called stating most of the pts family feels the pt should not be allowed to be alone. Requesting Dr. Rexene Alberts give her thoughts during the office visit but would not like for the pt know she called. FYI

## 2018-02-09 ENCOUNTER — Encounter: Payer: Self-pay | Admitting: Neurology

## 2018-02-09 ENCOUNTER — Ambulatory Visit (INDEPENDENT_AMBULATORY_CARE_PROVIDER_SITE_OTHER): Payer: Medicare Other | Admitting: Neurology

## 2018-02-09 VITALS — BP 147/78 | HR 74 | Ht 62.0 in | Wt 113.0 lb

## 2018-02-09 DIAGNOSIS — I639 Cerebral infarction, unspecified: Secondary | ICD-10-CM | POA: Diagnosis not present

## 2018-02-09 DIAGNOSIS — Z8673 Personal history of transient ischemic attack (TIA), and cerebral infarction without residual deficits: Secondary | ICD-10-CM | POA: Diagnosis not present

## 2018-02-09 NOTE — Progress Notes (Signed)
Subjective:    Patient ID: Lauren [name] is a 84 y.o. female.  HPI     Interim history:   Ms. Lauren [name] is a very pleasant 84 year old right-handed woman with an underlying medical history of type 2 diabetes, melanoma, history of breast cancer, pelvic fracture, osteopenia, TIAs, venous insufficiency, diverticulosis, hyperlipidemia, who presents for a follow-up consultation of her history of TIA and Hx of prior stroke. The patient is accompanied by her niece, Lauren [name], again today. I last saw her on 08/05/2017, at which time she felt stable. She had no recent falls. She was trying to hydrate well and trying to mobilize in the form of walking. She was walking some outside. She was staying alone overnight but had frequent checks with family members during the day.  Today, 02/09/2018 (all dictated new, as well as above notes, some dictation done in note pad or Word, outside of chart, may appear as copied):   She reports having had stomach problems, saw GI, Dr. Watt Climes, had recent Abd/Pelvic CT on 01/17/18: IMPRESSION: 1. No acute abdominal/pelvic findings, mass lesions or adenopathy. 2. Small benign-appearing splenic lesions, likely cysts or hemangiomas. 3. Advanced atherosclerotic calcifications but no aneurysm or dissection.  Sleeping okay, but has nocturia, about 2-3 times per night, but still can go back to sleep, no falls, or near-fall, no dizziness, no lightheaded. Appetite okay, but lost weight over time, about 5 pounds in the past 6 months and about 11 pounds since about a year ago. She reports that she eats slowly. She has added the occasional protein supplement, Ensure. She does not drink one daily. Sometimes she uses her walker when she needs to go to the bathroom at night but throughout the day she does not use a walker and sometimes not a cane inside the house. She has a good support system, she has a caretaker that used to take care of her older sister, she comes in twice a week for about 4  hours and helps also with grocery shopping and trips to the pharmacy, she also makes her breakfast on those days. She has a niece by marriage also comes twice a week for total of 8 hours and helps out with things like laundry, changing bedding etc. She has a call alert button. She feels safe at night. I did talk to them about gait safety and overall long-term living situation. I did mention to them that her other niece, [name] called yesterday wondering if she should have someone there overnight as well. Patient and her niece Lauren [name] are both satisfied with her current situation.   The patient's allergies, current medications, family history, past medical history, past social history, past surgical history and problem list were reviewed and updated as appropriate.    Previously (copied from previous notes for reference):    I saw her on 12/24/2016, at which time she had some concerns about her elevated blood pressure values. She had started a new blood pressure medication. Neurologically, she had no new symptoms.   I saw her on 09/23/2016, at which time she reported doing okay. She had recently been started on antibiotics for pneumonia and UTI. She was at [facility] for rehabilitation from 08/18/16 through 4/19. She had recuperated well from the stroke but had a setback because of acute illnesses including her UTI and pneumonia. She was trying to maintain a good spirit and eat well. She has good family support from nieces and nephews and extended family thankfully. Sadly, when she was hospitalized for her stroke  her older sister died at the age of 52 from her advanced dementia. She was advised to continue with Plavix and her other medications, and we talked about secondary stroke prevention.   I saw her on 03/19/2016 after she was hospitalized for TIA. She had full stroke workup in the hospital in October 2017. We reviewed her test results and hospital records at the time. She was stable at the time. She did have  2 interim episodes of problems concentrating and getting her words out. Her discharge diagnosis in October 2017 was TIA versus migraine variant.    She presented to the hospital on 08/15/2016 with left-sided weakness including left facial droop. She had a brain MRI which showed acute right anterior cerebral artery territory infarct, primarily involving the corpus callosum. MRA head was negative for large vessel occlusion, echocardiogram showed EF of 94-49, grade 1 diastolic dysfunction, carotid Doppler studies showed less than 40% ICA plaquing, her therapist recommended skilled nursing facility upon discharge. She was advised to continue Plavix, LDL was 71, hemoglobin A1c was 6.7. A loop recorder was recommended but patient decided against it. She was seen by cardiology.   03/19/2016: She was in the hospital on 02/28/2016 and discharged on 03/02/2016. She presented with difficulty with her speech and shakiness. She did recall that she had a similar episode in 2014. She had an EEG in 2014 which was normal in the awake state. She had an EEG on 03/02/2016 and I reviewed the results: Impression: This awake and drowsy EEG is normal. CT head without contrast was negative for any acute abnormalities, CTA neck was negative for any significant stenosis or occlusion. She had no need for physical therapy and occupational therapy after being evaluated by OT and PT. She was set up for 30 day cardiac monitoring as an outpatient.  She did not have a HA at the time, but in 2014 she did have an associated HA.    I reviewed the scan results:  Ct Angio Head and Neck W Or Wo Contrast 02/28/2016 1. Negative for emergent large vessel occlusion.  2. Minimal atherosclerosis in the neck without stenosis. No anterior circulation large vessel or first order vessel stenosis.  3. Moderate to severe stenosis in the left ACA pericallosal artery and bilateral MCA distal posterior M2 or M3 branches.  4. Mild to moderate stenosis at the  right PCA origin is new since 2014. Moderate to severe right P2 stenoses are new or progressed since 2014.  5. Mild to moderate aortic arch calcified atherosclerosis.    Ct Head Code Stroke W/o Cm 02/28/2016 1. Stable CT appearance of chronic small vessel disease. No acute intracranial hemorrhage or acute cortically based infarct identified.  2. ASPECTS is 10.    MR MRA Head without Contrast 02/29/2016 No acute infarction. Moderate chronic small-vessel ischemic changes of the cerebral hemispheric deep white matter. No intracranial major vessel occlusion or correctable proximal stenosis. Distal vessel atherosclerotic narrowing and irregularity most notable in multiple M2 segments bilaterally and in the PCAs, right more than left.   She had a TTE on 03/02/16 (Dr. Meda Coffee interpreted): Study Conclusions   - Left ventricle: The cavity size was normal. There was mild   concentric hypertrophy. Systolic function was vigorous. The   estimated ejection fraction was in the range of 65% to 70%. Wall   motion was normal; there were no regional wall motion   abnormalities. Doppler parameters are consistent with abnormal   left ventricular relaxation (grade 1 diastolic dysfunction).  Doppler parameters are consistent with elevated ventricular   end-diastolic filling pressure. - Aortic valve: Trileaflet; normal thickness leaflets. There was   mild regurgitation. - Aortic root: The aortic root was normal in size. - Mitral valve: Calcified annulus. Mildly thickened leaflets .   There was mild regurgitation. - Left atrium: The atrium was normal in size. - Right ventricle: Systolic function was normal. - Tricuspid valve: There was mild regurgitation. - Pulmonary arteries: Systolic pressure was within the normal   range. - Inferior vena cava: The vessel was normal in size. - Pericardium, extracardiac: There was no pericardial effusion.   02/20/13:    I first met her on 11/17/2012 and she reported a  TIA-like spell earlier in June and was seen in the hospital by my colleague Dr. Leonie Man. He changed her aspirin to Plavix and held her metformin due to low blood sugar values. She reported being in her normal state of health during the late moring on 10/27/12 when she had sudden onset of R hand and arm shaking, which she not control, and she could not get her words out. Her sister's caregiver called 911. She had a full stroke w/u in the hospital which I reviewed: Her CT showed a small plaque in the MCA M2 branch and white matter changes, she had negative carotid Doppler studies, there was no acute stroke on MRI brain and she had intracranial atherosclerosis on MRA head. Her blood work was unremarkable, with normal lipids, A1c of 6.3. Carotid doppler study in June 2014 showed 0-49% ICA stenosis. She had transient neurological symptoms including difficulty forming her words and right arm shakiness, no facial droop. She has a long-standing history of migraine headaches, but also c/o a bandlike sensation in the front. For her headaches she takes tylenol. She was given neurontin by a HA specialist in the past, but it was not helpful.  Since she was discharged she has had more hand tremor and intermittent zoning out at time, no frank convulsions. She has had a hand tremor for years. Her 2 nephews have tremors.  Since her last visit she presented to the emergency room on 01/11/2013 with chest pain, relieved by nitroglycerin sublingual. An acute coronary syndrome was excluded. She had a stress test: On 01/12/2013 she had a normal Lexi scan Myoview. She had a chest x-ray on 01/11/2013 which was negative for any acute or active cardiopulmonary disease. She had a head CT without contrast on 12/28/2012 which showed no acute intracranial abnormalities, near-complete opacification of the right sphenoid sinus, mild cerebral atrophy with extensive chronic microvascular ischemic changes. On 12/22/2012 she had a bilateral mammogram  showing stable post lumpectomy site in the left breast and probably benign calcifications in the upper outer right breast which appear punctate and loosely grouped.  She saw her primary care physician on 01/25/2013 and endorsed anxiety and stressors. At the time of her first visit with me I ordered an EEG. This was normal in the awake state. I felt that her headaches were primarily tension-type headaches. She also had evidence of essential tremor, very mild. She is doing better in all aspect, she is starting to gain some weight back.   Her Past Medical History Is Significant For: Past Medical History:  Diagnosis Date  . Breast cancer ([place])    left breast  . Chronic constipation   . Chronic headaches   . Diabetes mellitus   . Fibrocystic breast   . Gastritis   . Gout    injection in the  past  . Hyperlipidemia   . Melanoma in situ of left upper extremity ([place])   . Pelvic fracture ([place])   . TIA (transient ischemic attack) 08/2008   neg MRI/MRA and CT and carotid dopplers  . Venous insufficiency     Her Past Surgical History Is Significant For: Past Surgical History:  Procedure Laterality Date  . APPENDECTOMY    . ARM SKIN LESION BIOPSY / EXCISION Right 11/02/2016  . BREAST BIOPSY Left 10/18/2007  . BREAST CYST ASPIRATION    . BREAST EXCISIONAL BIOPSY Right    x2  . BREAST LUMPECTOMY  09/2007   with sentinel LN biopsy  . BREAST LUMPECTOMY     left  . EYE SURGERY     retinal, then cataract    Her Family History Is Significant For: Family History  Problem Relation Age of Onset  . Breast cancer Sister   . Stroke Sister   . Cancer Sister        breast  . Breast cancer Sister   . Stroke Sister   . Cancer Sister        breast  . Stroke Mother   . Stroke Father   . Stroke Brother   . Cancer Brother        prostate  . Stroke Brother   . Stroke Sister   . CAD Sister     Her Social History Is Significant For: Social History   Socioeconomic History  . Marital  status: Single    Spouse name: Not on file  . Number of children: Not on file  . Years of education: college  . Highest education level: Not on file  Occupational History  . Not on file  Social Needs  . Financial resource strain: Not on file  . Food insecurity:    Worry: Not on file    Inability: Not on file  . Transportation needs:    Medical: Not on file    Non-medical: Not on file  Tobacco Use  . Smoking status: Never Smoker  . Smokeless tobacco: Never Used  Substance and Sexual Activity  . Alcohol use: No    Alcohol/week: 0.0 standard drinks  . Drug use: No  . Sexual activity: Not Currently  Lifestyle  . Physical activity:    Days per week: Not on file    Minutes per session: Not on file  . Stress: Not on file  Relationships  . Social connections:    Talks on phone: Not on file    Gets together: Not on file    Attends religious service: Not on file    Active member of club or organization: Not on file    Attends meetings of clubs or organizations: Not on file    Relationship status: Not on file  Other Topics Concern  . Not on file  Social History Narrative   Lives with and cares for both sisters   Strong faith   Denies caffeine use     Her Allergies Are:  Allergies  Allergen Reactions  . Sulfonamide Derivatives Itching  . Aspirin Other (See Comments)    Burning and pain in stomach  :   Her Current Medications Are:  Outpatient Encounter Medications as of 02/09/2018  Medication Sig  . acetaminophen (TYLENOL) 325 MG tablet Take 2 tablets (650 mg total) by mouth every 6 (six) hours as needed for mild pain (or Fever >/= 101).  Marland Kitchen Besifloxacin HCl (BESIVANCE) 0.6 % SUSP Apply 1 drop to eye 4 (  four) times daily. PLACE 1 DROP INTO THE RIGHT EYE FOUR TIMES A DAY FOR 2 DAYS FOLLOWING EACH EYE  . calcium carbonate (TUMS - DOSED IN MG ELEMENTAL CALCIUM) 500 MG chewable tablet Chew 1 tablet by mouth as needed for heartburn.   . cephALEXin (KEFLEX) 250 MG capsule Take 1  capsule (250 mg total) by mouth 2 (two) times daily.  . citalopram (CELEXA) 10 MG tablet Take 0.5 tablets (5 mg total) by mouth daily.  . clopidogrel (PLAVIX) 75 MG tablet TAKE ONE TABLET BY MOUTH EVERY MORNING WITH BREAKFAST.  Marland Kitchen dicyclomine (BENTYL) 10 MG capsule Take 10 mg by mouth 2 (two) times daily.  . fluticasone (FLONASE) 50 MCG/ACT nasal spray Place into both nostrils daily.  Marland Kitchen glucose blood (ONE TOUCH ULTRA TEST) test strip USE AS DIRECTED TO CHECK BLOOD SUGAR ONCE A DAY AND AS NEEDED (DX. E11.9)  . metFORMIN (GLUCOPHAGE) 500 MG tablet Take 0.5 tablets (250 mg total) by mouth 2 (two) times daily with a meal.  . Multiple Vitamins-Minerals (PRESERVISION AREDS 2 PO) Take 1 tablet by mouth 2 (two) times daily.  . pantoprazole (PROTONIX) 40 MG tablet Take 1 tablet (40 mg total) by mouth daily.  . polyethylene glycol powder (GLYCOLAX/MIRALAX) powder DISSOLVE ONE (1) CAPFUL (17GM) INTO 4 TO8 OUNCES OF FLUID AND TAKE BYMOUTH ONCE DAILY AS DIRECTED  . ranitidine (ZANTAC) 300 MG tablet Take 1 tablet (300 mg total) by mouth daily as needed for heartburn.  . simethicone (MYLICON) 80 MG chewable tablet Chew 80 mg by mouth every 6 (six) hours as needed for flatulence.  . simvastatin (ZOCOR) 20 MG tablet Take 1 tablet (20 mg total) by mouth at bedtime.   No facility-administered encounter medications on file as of 02/09/2018.   :  Review of Systems:  Out of a complete 14 point review of systems, all are reviewed and negative with the exception of these symptoms as listed below:  Review of Systems  Neurological:       Pt presents today for follow up. Pt reports that she has lost 5lbs over the past 6 months but feels that she is eating well. She occasionally drinks a protein shake.    Objective:  Neurological Exam  Physical Exam Physical Examination:   Vitals:   02/09/18 1259  BP: (!) 147/78  Pulse: 74   General Examination: The patient is a very pleasant 84 y.o. female in no acute  distress. She appears more frail, less verbal, but answers questions appropriately, shorter sentences, well groomed.   HEENT:Normocephalic, atraumatic, pupils are equal, round and reactive to light and accommodation. Extraocular tracking is good without limitation to gaze excursion or nystagmus noted. Corrective eyeglasses in place. Hearing is grossly intact. Face is symmetric with normal facial animation and normal facial sensation. Speech is clear but more scant, softer speech. She does have a tiny little bit of lower jaw and head tremor.Oropharynx exam reveals: no significant mouth dryness, adequate dental hygiene and mild airway crowding. Mallampati is class II. Tongue protrudes centrally and palate elevates symmetrically.   Chest:Clear to auscultation without wheezing, rhonchi or crackles noted.  Heart:S1+S2+0, regular and normal without murmurs, rubs or gallops noted.   Abdomen:Soft, non-tender and non-distended with normal bowel sounds appreciated on auscultation.  Extremities:There is no pitting edema in the distal lower extremities bilaterally.   Skin: Warm and dry without trophic changes noted. There are no varicose veins.  Musculoskeletal: exam reveals no obvious joint deformities, tenderness or joint swelling or erythema.  Neurologically:  Mental status: The patient is awake, alert and oriented in all 4 spheres. Her memory, attention, language and knowledge are quite appropriate. There is no aphasia, agnosia, apraxia or anomia. Speech is clear with normal prosody and enunciation. Thought process is linear. Mood is congruent and affect is normal.   On 02/09/2018: MMSE: 29/30 (missed last # on serial 7s), CDT: 4/4, AFT: 11/min.  Cranial nerves are as described above under HEENT exam.  Motor exam: Normal bulk, strength and tone is noted. There is no drift, or rebound. She has an intermittent tremor in both hands, resting and with posture and action, more so in the RUE. Fine  motor skills are globally mildly impaired.  Cerebellar testing shows no dysmetria or intention tremor. There is no truncal or gait ataxia.  Sensory exam is intact to light touch in the upper and lower extremities.  Gait, station and balance: She stands up with mild difficulty and her posture is mildly stooped, but fairly age-appropriate. She walks cautiously and turns in 3 steps. She has a mild decrease in arm swing bilaterally. She uses a single-point cane on the right.  Assessment and Plan:   In summary, Lauren [name] is a very pleasant 84 year old female with a history of headaches, tremors, Norva Pavlov presents for follow-up.She had TIA/stroke workup in the hospital in 2018. Her exam is stable. Her BP has been good. She is encouraged to stay well hydrated with water and encouraged to try to walk every day. She has good support from her family and caretakers. She is encouraged to use her walker at night when she has to get up and use the bathroom which is a fairly frequent occurrence. She sleeps well. She has lost a little bit of weight over time and is advised to add a protein milkshake supplement on a daily basis. From the neurological standpoint she has done well. I would be happy to see her back on an as-needed basis. I answered all their questions today and she and Lauren [name] were in agreement. I spent 30 minutes in total face-to-face time with the patient, more than 50% of which was spent in counseling and coordination of care, reviewing test results, reviewing medication and discussing or reviewing the diagnosis of TIA, its prognosis and treatment options. Pertinent laboratory and imaging test results that were available during this visit with the patient were reviewed by me and considered in my medical decision making (see chart for details).

## 2018-02-09 NOTE — Patient Instructions (Signed)
Your exam is stable, your memory scores are good. Please try to the mindful of your nutrition and calorie intake, add one bottle of Ensure are any protein milkshake supplement of your choosing once a day. Use your walker when you have to get up at night to go to the bathroom. Fall risk is real! Please remember to stand up slowly and get your bearings first turn slowly, no bending down to pick anything, no heavy lifting, be extra careful at night and first thing in the morning. Also, be careful in the Bathroom and the kitchen. Try to hydrate well with water. I would be happy to see you back as needed.

## 2018-02-15 ENCOUNTER — Telehealth: Payer: Self-pay | Admitting: Family Medicine

## 2018-02-15 NOTE — Telephone Encounter (Signed)
Copied from [place] [id number]. Topic: Inquiry >> Feb 15, 2018  8:26 AM Cecelia Byars, NT wrote: Reason for CRM: Patients Niece [name] called and would like to know what the plans are for  her taking ranitidine (ZANTAC) 300 MG tablet or not please call if she is to stop taking the medication ,Ms Rich Reining  says it ok to leave a message if no answer at  [phone] .

## 2018-02-15 NOTE — Telephone Encounter (Signed)
Please continue taking it

## 2018-02-15 NOTE — Telephone Encounter (Signed)
Renato Gails of Dr. Marliss Coots instructions

## 2018-02-24 ENCOUNTER — Ambulatory Visit (INDEPENDENT_AMBULATORY_CARE_PROVIDER_SITE_OTHER): Payer: Medicare Other

## 2018-02-24 DIAGNOSIS — Z23 Encounter for immunization: Secondary | ICD-10-CM | POA: Diagnosis not present

## 2018-03-07 ENCOUNTER — Encounter (INDEPENDENT_AMBULATORY_CARE_PROVIDER_SITE_OTHER): Payer: Medicare Other | Admitting: Ophthalmology

## 2018-03-07 DIAGNOSIS — H35033 Hypertensive retinopathy, bilateral: Secondary | ICD-10-CM | POA: Diagnosis not present

## 2018-03-07 DIAGNOSIS — E11311 Type 2 diabetes mellitus with unspecified diabetic retinopathy with macular edema: Secondary | ICD-10-CM

## 2018-03-07 DIAGNOSIS — H353132 Nonexudative age-related macular degeneration, bilateral, intermediate dry stage: Secondary | ICD-10-CM

## 2018-03-07 DIAGNOSIS — I10 Essential (primary) hypertension: Secondary | ICD-10-CM

## 2018-03-07 DIAGNOSIS — H43812 Vitreous degeneration, left eye: Secondary | ICD-10-CM | POA: Diagnosis not present

## 2018-03-07 DIAGNOSIS — E113211 Type 2 diabetes mellitus with mild nonproliferative diabetic retinopathy with macular edema, right eye: Secondary | ICD-10-CM | POA: Diagnosis not present

## 2018-03-07 DIAGNOSIS — H33302 Unspecified retinal break, left eye: Secondary | ICD-10-CM | POA: Diagnosis not present

## 2018-04-01 DIAGNOSIS — L218 Other seborrheic dermatitis: Secondary | ICD-10-CM | POA: Diagnosis not present

## 2018-04-01 DIAGNOSIS — Z85828 Personal history of other malignant neoplasm of skin: Secondary | ICD-10-CM | POA: Diagnosis not present

## 2018-04-01 DIAGNOSIS — L820 Inflamed seborrheic keratosis: Secondary | ICD-10-CM | POA: Diagnosis not present

## 2018-04-12 DIAGNOSIS — H04123 Dry eye syndrome of bilateral lacrimal glands: Secondary | ICD-10-CM | POA: Diagnosis not present

## 2018-04-12 DIAGNOSIS — H0100A Unspecified blepharitis right eye, upper and lower eyelids: Secondary | ICD-10-CM | POA: Diagnosis not present

## 2018-04-12 DIAGNOSIS — E113291 Type 2 diabetes mellitus with mild nonproliferative diabetic retinopathy without macular edema, right eye: Secondary | ICD-10-CM | POA: Diagnosis not present

## 2018-04-12 DIAGNOSIS — H5213 Myopia, bilateral: Secondary | ICD-10-CM | POA: Diagnosis not present

## 2018-04-12 LAB — HM DIABETES EYE EXAM

## 2018-04-14 ENCOUNTER — Encounter: Payer: Self-pay | Admitting: Family Medicine

## 2018-05-30 ENCOUNTER — Encounter (INDEPENDENT_AMBULATORY_CARE_PROVIDER_SITE_OTHER): Payer: Medicare Other | Admitting: Ophthalmology

## 2018-05-30 DIAGNOSIS — E11311 Type 2 diabetes mellitus with unspecified diabetic retinopathy with macular edema: Secondary | ICD-10-CM | POA: Diagnosis not present

## 2018-05-30 DIAGNOSIS — E113392 Type 2 diabetes mellitus with moderate nonproliferative diabetic retinopathy without macular edema, left eye: Secondary | ICD-10-CM

## 2018-05-30 DIAGNOSIS — H43813 Vitreous degeneration, bilateral: Secondary | ICD-10-CM | POA: Diagnosis not present

## 2018-05-30 DIAGNOSIS — H353111 Nonexudative age-related macular degeneration, right eye, early dry stage: Secondary | ICD-10-CM | POA: Diagnosis not present

## 2018-05-30 DIAGNOSIS — H353122 Nonexudative age-related macular degeneration, left eye, intermediate dry stage: Secondary | ICD-10-CM

## 2018-05-30 DIAGNOSIS — E113311 Type 2 diabetes mellitus with moderate nonproliferative diabetic retinopathy with macular edema, right eye: Secondary | ICD-10-CM

## 2018-06-14 ENCOUNTER — Ambulatory Visit (INDEPENDENT_AMBULATORY_CARE_PROVIDER_SITE_OTHER): Payer: Medicare Other | Admitting: Family Medicine

## 2018-06-14 ENCOUNTER — Encounter: Payer: Self-pay | Admitting: Family Medicine

## 2018-06-14 ENCOUNTER — Telehealth: Payer: Self-pay

## 2018-06-14 VITALS — BP 120/70 | HR 86 | Temp 97.6°F | Ht 63.0 in | Wt 112.5 lb

## 2018-06-14 DIAGNOSIS — R350 Frequency of micturition: Secondary | ICD-10-CM

## 2018-06-14 DIAGNOSIS — E1159 Type 2 diabetes mellitus with other circulatory complications: Secondary | ICD-10-CM

## 2018-06-14 LAB — POC URINALSYSI DIPSTICK (AUTOMATED)
Bilirubin, UA: NEGATIVE
Blood, UA: NEGATIVE
Glucose, UA: NEGATIVE
Ketones, UA: NEGATIVE
Leukocytes, UA: NEGATIVE
Nitrite, UA: NEGATIVE
Protein, UA: NEGATIVE
SPEC GRAV UA: 1.01 (ref 1.010–1.025)
UROBILINOGEN UA: 0.2 U/dL
pH, UA: 6 (ref 5.0–8.0)

## 2018-06-14 LAB — POCT GLYCOSYLATED HEMOGLOBIN (HGB A1C): HEMOGLOBIN A1C: 6.4 % — AB (ref 4.0–5.6)

## 2018-06-14 NOTE — Patient Instructions (Addendum)
Urine looking ok today. Ensure good water intake.  Avoid bladder irritants like caffeine, dark sodas, spicy foods.  If ongoing trouble, return to see Dr Glori Bickers.  Update A1c today.   Overactive Bladder, Adult  Overactive bladder refers to a condition in which a person has a sudden need to pass urine. The person may leak urine if he or she cannot get to the bathroom fast enough (urinary incontinence). A person with this condition may also wake up several times in the night to go to the bathroom. Overactive bladder is associated with poor nerve signals between your bladder and your brain. Your bladder may get the signal to empty before it is full. You may also have very sensitive muscles that make your bladder squeeze too soon. These symptoms might interfere with daily work or social activities. What are the causes? This condition may be associated with or caused by:  Urinary tract infection.  Infection of nearby tissues, such as the prostate.  Prostate enlargement.  Surgery on the uterus or urethra.  Bladder stones, inflammation, or tumors.  Drinking too much caffeine or alcohol.  Certain medicines, especially medicines that get rid of extra fluid in the body (diuretics).  Muscle or nerve weakness, especially from: ? A spinal cord injury. ? Stroke. ? Multiple sclerosis. ? Parkinson's disease.  Diabetes.  Constipation. What increases the risk? You may be at greater risk for overactive bladder if you:  Are an older adult.  Smoke.  Are going through menopause.  Have prostate problems.  Have a neurological disease, such as stroke, dementia, Parkinson's disease, or multiple sclerosis (MS).  Eat or drink things that irritate the bladder. These include alcohol, spicy food, and caffeine.  Are overweight or obese. What are the signs or symptoms? Symptoms of this condition include:  Sudden, strong urge to urinate.  Leaking urine.  Urinating 8 or more times a  day.  Waking up to urinate 2 or more times a night. How is this diagnosed? Your health care provider may suspect overactive bladder based on your symptoms. He or she will diagnose this condition by:  A physical exam and medical history.  Blood or urine tests. You might need bladder or urine tests to help determine what is causing your overactive bladder. You might also need to see a health care provider who specializes in urinary tract problems (urologist). How is this treated? Treatment for overactive bladder depends on the cause of your condition and whether it is mild or severe. You can also make lifestyle changes at home. Options include:  Bladder training. This may include: ? Learning to control the urge to urinate by following a schedule that directs you to urinate at regular intervals (timed voiding). ? Doing Kegel exercises to strengthen your pelvic floor muscles, which support your bladder. Toning these muscles can help you control urination, even if your bladder muscles are overactive.  Special devices. This may include: ? Biofeedback, which uses sensors to help you become aware of your body's signals. ? Electrical stimulation, which uses electrodes placed inside the body (implanted) or outside the body. These electrodes send gentle pulses of electricity to strengthen the nerves or muscles that control the bladder. ? Women may use a plastic device that fits into the vagina and supports the bladder (pessary).  Medicines. ? Antibiotics to treat bladder infection. ? Antispasmodics to stop the bladder from releasing urine at the wrong time. ? Tricyclic antidepressants to relax bladder muscles. ? Injections of botulinum toxin type A directly into  the bladder tissue to relax bladder muscles.  Lifestyle changes. This may include: ? Weight loss. Talk to your health care provider about weight loss methods that would work best for you. ? Diet changes. This may include reducing how much  alcohol and caffeine you consume, or drinking fluids at different times of the day. ? Not smoking. Do not use any products that contain nicotine or tobacco, such as cigarettes and e-cigarettes. If you need help quitting, ask your health care provider.  Surgery. ? A device may be implanted to help manage the nerve signals that control urination. ? An electrode may be implanted to stimulate electrical signals in the bladder. ? A procedure may be done to change the shape of the bladder. This is done only in very severe cases. Follow these instructions at home: Lifestyle  Make any diet or lifestyle changes that are recommended by your health care provider. These may include: ? Drinking less fluid or drinking fluids at different times of the day. ? Cutting down on caffeine or alcohol. ? Doing Kegel exercises. ? Losing weight if needed. ? Eating a healthy and balanced diet to prevent constipation. This may include:  Eating foods that are high in fiber, such as fresh fruits and vegetables, whole grains, and beans.  Limiting foods that are high in fat and processed sugars, such as fried and sweet foods. General instructions  Take over-the-counter and prescription medicines only as told by your health care provider.  If you were prescribed an antibiotic medicine, take it as told by your health care provider. Do not stop taking the antibiotic even if you start to feel better.  Use any implants or pessary as told by your health care provider.  If needed, wear pads to absorb urine leakage.  Keep a journal or log to track how much and when you drink and when you feel the need to urinate. This will help your health care provider monitor your condition.  Keep all follow-up visits as told by your health care provider. This is important. Contact a health care provider if:  You have a fever.  Your symptoms do not get better with treatment.  Your pain and discomfort get worse.  You have more  frequent urges to urinate. Get help right away if:  You are not able to control your bladder. Summary  Overactive bladder refers to a condition in which a person has a sudden need to pass urine.  Several conditions may lead to an overactive bladder.  Treatment for overactive bladder depends on the cause and severity of your condition.  Follow your health care provider's instructions about lifestyle changes, doing Kegel exercises, keeping a journal, and taking medicines. This information is not intended to replace advice given to you by your health care provider. Make sure you discuss any questions you have with your health care provider. Document Released: 03/07/2009 Document Revised: 05/27/2017 Document Reviewed: 05/27/2017 Elsevier Interactive Patient Education  2019 [employer].

## 2018-06-14 NOTE — Telephone Encounter (Signed)
[name] said pharmacy advised pt cannot get the diabetic meter, strips and lancets that pt has been getting; [name] said had been using [employer] brand) [name] will ck with Ins co.  to see what diabetic meter, strips and lancets is approved for pt and [name] will cb with that info. Pt is cking BS once daily. Will wait for cb. [facility].

## 2018-06-14 NOTE — Assessment & Plan Note (Signed)
UA today normal - anticipate worsening urinary incontinence. rec avoid bladder irritants, f/u with PCP to discuss possible treatment options ?myrbetriq

## 2018-06-14 NOTE — Progress Notes (Signed)
BP 120/70 (BP Location: Right Arm, Patient Position: Sitting, Cuff Size: Normal)   Pulse 86   Temp 97.6 F (36.4 C) (Oral)   Ht 5\' 3"  (1.6 m)   Wt 112 lb 8 oz (51 kg)   SpO2 96%   BMI 19.93 kg/m    CC: UTI Subjective:    Patient ID: Lauren [name], female    DOB: 10/08/1927, 85 y.o.   MRN: [id number]  HPI: Lauren [name] is a 85 y.o. female presenting on 06/14/2018 for Urinary Frequency (C/o urinary frequency, day/night. Started 2-3 wks ago. Pt accomapanied by her niece, [name]. )   Increased nocturia every 2 hours started last night. She has also noted increased urinary frequency and urgency over the last 2 weeks. Feels she fully empties. No stress incontinence symptoms. Worsening urge incontinence with accidents.   Denies dysuria, hematuria, flank pain, nausea/vomiting, diarrhea/constipation or bowel changes.   Endorses chronic abd pain for years of unclear etiology (GI [place]). Chronically on miralax. Also takes align probiotic. She regularly takes zantac and simethicone and pantoprazole.   Last UTI 11/2017. No recent abx.  Hx PNA 2018.  Known diabetic. Doesn't check sugars.  Lab Results  Component Value Date   HGBA1C 6.4 (A) 06/14/2018        Relevant past medical, surgical, family and social history reviewed and updated as indicated. Interim medical history since our last visit reviewed. Allergies and medications reviewed and updated. Outpatient Medications Prior to Visit  Medication Sig Dispense Refill  . acetaminophen (TYLENOL) 325 MG tablet Take 2 tablets (650 mg total) by mouth every 6 (six) hours as needed for mild pain (or Fever >/= 101).    Marland Kitchen Besifloxacin HCl (BESIVANCE) 0.6 % SUSP Apply 1 drop to eye 4 (four) times daily. PLACE 1 DROP INTO THE RIGHT EYE FOUR TIMES A DAY FOR 2 DAYS FOLLOWING EACH EYE    . calcium carbonate (TUMS - DOSED IN MG ELEMENTAL CALCIUM) 500 MG chewable tablet Chew 1 tablet by mouth as needed for heartburn.     . citalopram (CELEXA) 10 MG  tablet Take 0.5 tablets (5 mg total) by mouth daily. 45 tablet 3  . clopidogrel (PLAVIX) 75 MG tablet TAKE ONE TABLET BY MOUTH EVERY MORNING WITH BREAKFAST. 90 tablet 3  . dicyclomine (BENTYL) 10 MG capsule Take 10 mg by mouth 2 (two) times daily.    . fluticasone (FLONASE) 50 MCG/ACT nasal spray Place into both nostrils daily.    Marland Kitchen glucose blood (ONE TOUCH ULTRA TEST) test strip USE AS DIRECTED TO CHECK BLOOD SUGAR ONCE A DAY AND AS NEEDED (DX. E11.9) 100 each 1  . metFORMIN (GLUCOPHAGE) 500 MG tablet Take 0.5 tablets (250 mg total) by mouth 2 (two) times daily with a meal. 180 tablet 3  . Multiple Vitamins-Minerals (PRESERVISION AREDS 2 PO) Take 1 tablet by mouth 2 (two) times daily.    . pantoprazole (PROTONIX) 40 MG tablet Take 1 tablet (40 mg total) by mouth daily. 90 tablet 3  . polyethylene glycol powder (GLYCOLAX/MIRALAX) powder DISSOLVE ONE (1) CAPFUL (17GM) INTO 4 TO8 OUNCES OF FLUID AND TAKE BYMOUTH ONCE DAILY AS DIRECTED 527 g 1  . Probiotic Product (ALIGN PO) Take by mouth daily.    . ranitidine (ZANTAC) 300 MG tablet Take 1 tablet (300 mg total) by mouth daily as needed for heartburn. 90 tablet 3  . simethicone (MYLICON) 80 MG chewable tablet Chew 80 mg by mouth every 6 (six) hours as needed for flatulence.    Marland Kitchen  simvastatin (ZOCOR) 20 MG tablet Take 1 tablet (20 mg total) by mouth at bedtime. 90 tablet 3  . cephALEXin (KEFLEX) 250 MG capsule Take 1 capsule (250 mg total) by mouth 2 (two) times daily. 14 capsule 0   No facility-administered medications prior to visit.      Per HPI unless specifically indicated in ROS section below Review of Systems Objective:    BP 120/70 (BP Location: Right Arm, Patient Position: Sitting, Cuff Size: Normal)   Pulse 86   Temp 97.6 F (36.4 C) (Oral)   Ht 5\' 3"  (1.6 m)   Wt 112 lb 8 oz (51 kg)   SpO2 96%   BMI 19.93 kg/m   Wt Readings from Last 3 Encounters:  06/14/18 112 lb 8 oz (51 kg)  02/09/18 113 lb (51.3 kg)  11/30/17 113 lb 4 oz  (51.4 kg)    Physical Exam Vitals signs and nursing note reviewed.  Constitutional:      Appearance: Normal appearance. She is not ill-appearing.  HENT:     Mouth/Throat:     Mouth: Mucous membranes are moist.     Pharynx: Oropharynx is clear.  Cardiovascular:     Rate and Rhythm: Normal rate and regular rhythm.     Pulses: Normal pulses.     Heart sounds: Normal heart sounds. No murmur.  Pulmonary:     Effort: Pulmonary effort is normal. No respiratory distress.     Breath sounds: Normal breath sounds. No wheezing, rhonchi or rales.  Abdominal:     General: Abdomen is protuberant. Bowel sounds are normal. There is no distension.     Palpations: Abdomen is soft. There is no hepatomegaly, splenomegaly or mass.     Tenderness: There is abdominal tenderness (mild) in the epigastric area and suprapubic area. There is no right CVA tenderness, left CVA tenderness, guarding or rebound.     Hernia: No hernia is present.  Neurological:     Mental Status: She is alert.  Psychiatric:        Mood and Affect: Mood normal.       Results for orders placed or performed in visit on 06/14/18  POCT Urinalysis Dipstick (Automated)  Result Value Ref Range   Color, UA yellow    Clarity, UA clear    Glucose, UA Negative Negative   Bilirubin, UA negative    Ketones, UA negative    Spec Grav, UA 1.010 1.010 - 1.025   Blood, UA negative    pH, UA 6.0 5.0 - 8.0   Protein, UA Negative Negative   Urobilinogen, UA 0.2 0.2 or 1.0 E.U./dL   Nitrite, UA negative    Leukocytes, UA Negative Negative  POCT glycosylated hemoglobin (Hb A1C)  Result Value Ref Range   Hemoglobin A1C 6.4 (A) 4.0 - 5.6 %   HbA1c POC (<> result, manual entry)     HbA1c, POC (prediabetic range)     HbA1c, POC (controlled diabetic range)     Assessment & Plan:   Problem List Items Addressed This Visit    Urinary frequency - Primary    UA today normal - anticipate worsening urinary incontinence. rec avoid bladder irritants,  f/u with PCP to discuss possible treatment options ?myrbetriq      Relevant Orders   POCT Urinalysis Dipstick (Automated) (Completed)   Diabetes mellitus type II, controlled ([place])    Update A1c to r/o worsening diabetic control contribution      Relevant Orders   POCT glycosylated hemoglobin (Hb A1C) (  Completed)       No orders of the defined types were placed in this encounter.  Orders Placed This Encounter  Procedures  . POCT Urinalysis Dipstick (Automated)  . POCT glycosylated hemoglobin (Hb A1C)    Follow up plan: Return if symptoms worsen or fail to improve.  Ria Bush, MD

## 2018-06-14 NOTE — Assessment & Plan Note (Signed)
Update A1c to r/o worsening diabetic control contribution

## 2018-06-15 NOTE — Telephone Encounter (Signed)
Spoke to Lauren [name] who states she contacted her insurance company but they advised its covered under her pharmacy benefits and not them, and they were unable to provide her with the name of covered supplies.  She then spoke to [place] who states she is needing a Rx for generic supplies. She can be reached at [phone]. pls advise

## 2018-06-15 NOTE — Telephone Encounter (Signed)
Order is in IN box to fax

## 2018-06-16 MED ORDER — GLUCOSE BLOOD VI STRP: ORAL_STRIP | 1 refills | Status: AC

## 2018-06-16 MED ORDER — FREESTYLE LITE DEVI: 0 refills | Status: AC

## 2018-06-16 MED ORDER — FREESTYLE LANCETS MISC: 1 refills | Status: AC

## 2018-06-16 NOTE — Addendum Note (Signed)
Addended by: Tammi Sou on: 06/16/2018 02:08 PM   Modules accepted: Orders

## 2018-06-16 NOTE — Telephone Encounter (Signed)
Spoke with [name] and order for freestyle meter, strips and lancets [facility] per [facility] request. Generic order for meter Dr. Glori Bickers wrote was shredded since I could sent freestyle in electronically

## 2018-06-16 NOTE — Telephone Encounter (Signed)
Lauren [name] (DPR signed) found out the brand covered would be onetouch or freestyle and Lauren [name] prefers freestyle. Linda request cb about status of diabetic supplies.

## 2018-08-01 ENCOUNTER — Ambulatory Visit (INDEPENDENT_AMBULATORY_CARE_PROVIDER_SITE_OTHER): Payer: Medicare Other | Admitting: Family Medicine

## 2018-08-01 ENCOUNTER — Encounter: Payer: Self-pay | Admitting: Family Medicine

## 2018-08-01 VITALS — BP 136/70 | HR 92 | Temp 97.8°F | Ht 63.0 in | Wt 107.3 lb

## 2018-08-01 DIAGNOSIS — R109 Unspecified abdominal pain: Secondary | ICD-10-CM

## 2018-08-01 DIAGNOSIS — R634 Abnormal weight loss: Secondary | ICD-10-CM | POA: Insufficient documentation

## 2018-08-01 DIAGNOSIS — I69359 Hemiplegia and hemiparesis following cerebral infarction affecting unspecified side: Secondary | ICD-10-CM

## 2018-08-01 DIAGNOSIS — G8929 Other chronic pain: Secondary | ICD-10-CM

## 2018-08-01 NOTE — Progress Notes (Signed)
Subjective:    Patient ID: Lauren [name], female    DOB: Jul 22, 1927, 85 y.o.   MRN: [id number]  HPI Here for weight loss   Nephew's wife is here with her    Wt Readings from Last 3 Encounters:  08/01/18 107 lb 5 oz (48.7 kg)  06/14/18 112 lb 8 oz (51 kg)  02/09/18 113 lb (51.3 kg)  no idea why she is loosing weight  19.01 kg/m   Does not eat sweets and fried foods But diet has not changed   Appetite -feeling less hungry  Does not take more to fill her up  She also eats slowly  Per caregiver - she is eating smaller portions  Has people cooking for her   Caregiver thinks less than 1000 cal per day    She was 122 lb 11/18  Slow weight loss since then    DM2 Lab Results  Component Value Date   HGBA1C 6.4 (A) 06/14/2018   Improved from 7.2  Hypothyroidism  Pt has no clinical changes No change in energy level/ hair or skin/ edema and no tremor Lab Results  Component Value Date   TSH 3.92 11/09/2017    Free T4 was 0.95 -normal   Likes mac and cheese  Likes parker house rolls  Chicken Beef tips   Snacks twice daily (sets alarm)   Still having residual eff from stroke  Hard to get thoughts into words   Her gut has hurt all the time   Walks 30 minutes per day   Patient Active Problem List   Diagnosis Date Noted  . Weight loss 08/01/2018  . Leukocytosis 11/30/2017  . Bloating 11/30/2017  . Hemiparesis due to recent stroke ([place]) 09/07/2017  . Skin lesion of foot 09/07/2017  . Subclinical hypothyroidism 03/04/2017  . Diastolic dysfunction 32/04/2481  . Urinary frequency 09/17/2016  . H/O: CVA (cerebrovascular accident) 08/15/2016  . HTN (hypertension) 08/15/2016  . GERD (gastroesophageal reflux disease) 08/06/2015  . Hearing loss 08/06/2015  . Estrogen deficiency 05/01/2014  . Left knee pain 09/22/2013  . Encounter for Medicare annual wellness exam 04/04/2013  . Colon cancer screening 04/04/2013  . History of melanoma excision 09/27/2012  .  History of breast cancer, left 01/08/2011  . History of pelvic fracture 09/17/2010  . DEGENERATIVE JOINT DISEASE 02/13/2010  . Migraine variant 11/06/2009  . Osteopenia 10/18/2008  . Transient ischemic attack 09/10/2008  . Diabetes mellitus type II, controlled ([place]) 10/06/2007  . VENOUS INSUFFICIENCY 10/06/2007  . Diverticulosis of colon 10/06/2007  . FIBROCYSTIC BREAST DISEASE 10/06/2007  . HYPERCHOLESTEROLEMIA, PURE 11/29/2006   Past Medical History:  Diagnosis Date  . Breast cancer ([place])    left breast  . Chronic constipation   . Chronic headaches   . Diabetes mellitus   . Fibrocystic breast   . Gastritis   . Gout    injection in the past  . Hyperlipidemia   . Melanoma in situ of left upper extremity ([place])   . Pelvic fracture ([place])   . TIA (transient ischemic attack) 08/2008   neg MRI/MRA and CT and carotid dopplers  . Venous insufficiency    Past Surgical History:  Procedure Laterality Date  . APPENDECTOMY    . ARM SKIN LESION BIOPSY / EXCISION Right 11/02/2016  . BREAST BIOPSY Left 10/18/2007  . BREAST CYST ASPIRATION    . BREAST EXCISIONAL BIOPSY Right    x2  . BREAST LUMPECTOMY  09/2007   with sentinel LN biopsy  . BREAST  LUMPECTOMY     left  . EYE SURGERY     retinal, then cataract   Social History   Tobacco Use  . Smoking status: Never Smoker  . Smokeless tobacco: Never Used  Substance Use Topics  . Alcohol use: No    Alcohol/week: 0.0 standard drinks  . Drug use: No   Family History  Problem Relation Age of Onset  . Breast cancer Sister   . Stroke Sister   . Cancer Sister        breast  . Breast cancer Sister   . Stroke Sister   . Cancer Sister        breast  . Stroke Mother   . Stroke Father   . Stroke Brother   . Cancer Brother        prostate  . Stroke Brother   . Stroke Sister   . CAD Sister    Allergies  Allergen Reactions  . Sulfonamide Derivatives Itching  . Aspirin Other (See Comments)    Burning and pain in stomach    Current Outpatient Medications on File Prior to Visit  Medication Sig Dispense Refill  . acetaminophen (TYLENOL) 325 MG tablet Take 2 tablets (650 mg total) by mouth every 6 (six) hours as needed for mild pain (or Fever >/= 101).    Marland Kitchen Besifloxacin HCl (BESIVANCE) 0.6 % SUSP Apply 1 drop to eye 4 (four) times daily. PLACE 1 DROP INTO THE RIGHT EYE FOUR TIMES A DAY FOR 2 DAYS FOLLOWING EACH EYE    . Blood Glucose Monitoring Suppl (FREESTYLE LITE) DEVI USE AS DIRECTED TO CHECK BLOOD SUGAR ONCE A DAY AND AS NEEDED (DX. E11.9) 1 each 0  . calcium carbonate (TUMS - DOSED IN MG ELEMENTAL CALCIUM) 500 MG chewable tablet Chew 1 tablet by mouth as needed for heartburn.     . citalopram (CELEXA) 10 MG tablet Take 0.5 tablets (5 mg total) by mouth daily. 45 tablet 3  . clopidogrel (PLAVIX) 75 MG tablet TAKE ONE TABLET BY MOUTH EVERY MORNING WITH BREAKFAST. 90 tablet 3  . dicyclomine (BENTYL) 10 MG capsule Take 10 mg by mouth 2 (two) times daily.    . fluticasone (FLONASE) 50 MCG/ACT nasal spray Place into both nostrils daily.    Marland Kitchen glucose blood (FREESTYLE LITE) test strip USE AS DIRECTED TO CHECK BLOOD SUGAR ONCE A DAY AND AS NEEDED (DX. E11.9) 100 each 1  . Lancets (FREESTYLE) lancets USE AS DIRECTED TO CHECK BLOOD SUGAR ONCE A DAY AND AS NEEDED (DX. E11.9) 100 each 1  . metFORMIN (GLUCOPHAGE) 500 MG tablet Take 0.5 tablets (250 mg total) by mouth 2 (two) times daily with a meal. 180 tablet 3  . Multiple Vitamins-Minerals (PRESERVISION AREDS 2 PO) Take 1 tablet by mouth 2 (two) times daily.    . pantoprazole (PROTONIX) 40 MG tablet Take 1 tablet (40 mg total) by mouth daily. 90 tablet 3  . polyethylene glycol powder (GLYCOLAX/MIRALAX) powder DISSOLVE ONE (1) CAPFUL (17GM) INTO 4 TO8 OUNCES OF FLUID AND TAKE BYMOUTH ONCE DAILY AS DIRECTED 527 g 1  . Probiotic Product (ALIGN PO) Take by mouth daily.    . ranitidine (ZANTAC) 300 MG tablet Take 1 tablet (300 mg total) by mouth daily as needed for heartburn. 90  tablet 3  . simethicone (MYLICON) 80 MG chewable tablet Chew 80 mg by mouth every 6 (six) hours as needed for flatulence.    . simvastatin (ZOCOR) 20 MG tablet Take 1 tablet (20 mg total)  by mouth at bedtime. 90 tablet 3   No current facility-administered medications on file prior to visit.     Review of Systems  Constitutional: Positive for appetite change and fatigue. Negative for activity change, chills, fever and unexpected weight change.  HENT: Negative for trouble swallowing.   Eyes: Negative for pain and redness.  Respiratory: Negative for cough and shortness of breath.   Cardiovascular: Negative for chest pain.  Gastrointestinal: Positive for abdominal pain. Negative for abdominal distention, anal bleeding, blood in stool, constipation, diarrhea, nausea, rectal pain and vomiting.       Stomach growls constantly  More diffuse pain lately  More gas This is chronic and has been worked up with GI more than once  Endocrine: Negative for polydipsia and polyuria.  Genitourinary: Positive for frequency. Negative for dysuria.  Musculoskeletal: Positive for arthralgias and back pain. Negative for myalgias.  Skin: Negative for pallor and rash.  Allergic/Immunologic: Negative for environmental allergies.  Neurological: Negative for dizziness, syncope and numbness.       Slow speech since her last CVA  Hematological: Negative for adenopathy. Does not bruise/bleed easily.  Psychiatric/Behavioral: Negative for decreased concentration and dysphoric mood. The patient is not nervous/anxious.        Objective:   Physical Exam Constitutional:      General: She is not in acute distress.    Appearance: Normal appearance. She is well-developed and normal weight. She is not ill-appearing.     Comments: Slim  Weight loss noted   HENT:     Head: Normocephalic and atraumatic.     Nose: Nose normal.     Mouth/Throat:     Mouth: Mucous membranes are moist.     Pharynx: Oropharynx is clear. No  posterior oropharyngeal erythema.  Eyes:     General: No scleral icterus.    Conjunctiva/sclera: Conjunctivae normal.     Pupils: Pupils are equal, round, and reactive to light.  Neck:     Musculoskeletal: Normal range of motion and neck supple.     Thyroid: No thyromegaly.     Vascular: No carotid bruit or JVD.  Cardiovascular:     Rate and Rhythm: Regular rhythm. Tachycardia present.     Heart sounds: Normal heart sounds. No gallop.      Comments: Rate of 92 Pulmonary:     Effort: Pulmonary effort is normal. No respiratory distress.     Breath sounds: Normal breath sounds. No wheezing or rales.  Abdominal:     General: Bowel sounds are normal. There is no distension or abdominal bruit.     Palpations: Abdomen is soft. There is no mass.     Tenderness: There is no abdominal tenderness. There is no guarding or rebound.     Hernia: No hernia is present.     Comments: Despite chronic discomfort - no tenderness today  Loud bowel sounds (but normal)  Musculoskeletal:     Right lower leg: No edema.     Left lower leg: No edema.  Lymphadenopathy:     Cervical: No cervical adenopathy.  Skin:    General: Skin is warm and dry.     Coloration: Skin is not pale.     Findings: No rash.  Neurological:     Mental Status: She is alert. Mental status is at baseline.     Coordination: Coordination normal.     Deep Tendon Reflexes: Reflexes are normal and symmetric. Reflexes normal.  Psychiatric:        Mood and  Affect: Mood and affect normal.        Speech: Speech is delayed.        Behavior: Behavior normal.     Comments: Responses to questions are slow (since cva)   Mood is good  Pleasant and smiling  Here with helpful family            Assessment & Plan:   Problem List Items Addressed This Visit      Cardiovascular and Mediastinum   Hemiparesis due to recent stroke ([place])    Improved  Response time in speech is still long         Other   Chronic abdominal pain     With gas/feeling of bloating and loud abdominal noises as well as diffuse pain  Has had several GI w/u (reviewed her last note with Dr Watt Climes) Yvette Rack family to stop any GI medicines that are not obv helping  Perhaps eating more often and avoiding empty stomach may help a bit Continue to avoid gas causing foods (beans/cabbage)       Weight loss - Primary    Multifactorial causes of decreased appetite/calorie intake (early satiety) Worse since last CVA Chronic abdominal discomfort/gas -limits diet choices and also lowers appetitie Disc need to inc calories (even if not hungry) w/o overdoing sweets in light of DM Disc protein options to add to her day  Will attempt 6 small meals per day instead of 3 meals per day  Family will prompt  Also supplements like glucerna /boost as tolerated  Will continue to follow and weigh at home

## 2018-08-01 NOTE — Assessment & Plan Note (Signed)
Improved  Response time in speech is still long

## 2018-08-01 NOTE — Assessment & Plan Note (Signed)
Multifactorial causes of decreased appetite/calorie intake (early satiety) Worse since last CVA Chronic abdominal discomfort/gas -limits diet choices and also lowers appetitie Disc need to inc calories (even if not hungry) w/o overdoing sweets in light of DM Disc protein options to add to her day  Will attempt 6 small meals per day instead of 3 meals per day  Family will prompt  Also supplements like glucerna /boost as tolerated  Will continue to follow and weigh at home

## 2018-08-01 NOTE — Patient Instructions (Addendum)
Please try to get more meals in (perhaps 6 instead of 3)   A protein drink Lauren [name] /glucerna  (can add to each meal)  Other protein sources - meat/ fish/nuts or nut butters / dairy products/soy/dried beans   If you keep your stomach more full -you may have less pain   It's ok to add some more carbs /just don't overdo sweets   Please let me know if you continue to loose weight  Weight yourself once weekly if you want to

## 2018-08-01 NOTE — Assessment & Plan Note (Signed)
With gas/feeling of bloating and loud abdominal noises as well as diffuse pain  Has had several GI w/u (reviewed her last note with Dr Watt Climes) Lauren [name] family to stop any GI medicines that are not obv helping  Perhaps eating more often and avoiding empty stomach may help a bit Continue to avoid gas causing foods (beans/cabbage)

## 2018-08-04 ENCOUNTER — Telehealth: Payer: Self-pay

## 2018-08-04 MED ORDER — AZITHROMYCIN 250 MG PO TABS: ORAL_TABLET | ORAL | 0 refills | Status: DC

## 2018-08-04 NOTE — Telephone Encounter (Signed)
Lauren [name], patient's daughter, called and states patient was seen on 08/01/2018 for weight loss by Dr. Glori Bickers. Patient was not sick at that time. Patient started with feeling bad yesterday morning on 08/03/2018 with post nasal drip and later on developed cough-unsure of the color of phlegm, temp 100.7, head pressure, sore throat, sneezing. No body aches. Sounds worse and feels worse today. Has been taking Tylenol and Claritin. Daughter states patient is highly prone to getting pneumonia. She really does not want to bring patient in again if possible. Spoke with Dr. Glori Bickers and Zpak called in to [facility] in [place]. Renato Gails per Dr. Marliss Coots recommendations that if patient develops dyspnea/wheezing or feels much worse to go to ER.

## 2018-08-11 ENCOUNTER — Telehealth: Payer: Self-pay

## 2018-08-11 MED ORDER — BENZONATATE 200 MG PO CAPS: 200.0000 mg | ORAL_CAPSULE | Freq: Three times a day (TID) | ORAL | 1 refills | Status: DC | PRN

## 2018-08-11 NOTE — Telephone Encounter (Signed)
The tessalon is fine to try

## 2018-08-11 NOTE — Telephone Encounter (Signed)
Please send in a new Rx per last note Rx is old and med isn't on med list anymore

## 2018-08-11 NOTE — Telephone Encounter (Signed)
Pt finished her z-pak 08-08-18. Still coughing a lot. No fever. O2 97% Taking Delsym, but that makes her sleep more than usual. She has some benzonatate that expired 09/2017. Asking if she could try those or get a new rx. If new rx, send to [place]. Please advise either way.

## 2018-08-11 NOTE — Telephone Encounter (Signed)
Sent!

## 2018-08-19 ENCOUNTER — Telehealth: Payer: Self-pay

## 2018-08-19 NOTE — Telephone Encounter (Signed)
Pt said that Dr Zigmund Daniel office called pt to change appt and advised pt to call Dr Glori Bickers due to SOB. Pt said since 08/03/18 pt has had some cough and SOB, no fever pt has been sitting. No traveling and no known exposure to covid or flu. No fever 98.1 now. I spoke with [name] and she said she would go to pts home now and ck vital and assess the pt. [name] said pulse ox 96%. BP 155/84 and P 83. [name] said this is how pt usually sounds with her breathing. I spoke wit Dr Glori Bickers and Dr Glori Bickers said vitals are OK and she trust Lindas judgement; to monitor pt over weekend and if pt worsens to go to ED otherwise call [facility] on 08/22/18 with update and we could do web visit. Linda notified as instructed and voiced understanding and [name] said pt does not have smart phone or computer. I asked [name] if she had smart phone and [name] said no. fyi to Dr Glori Bickers. Also while [name] was waiting she rechecked BP and it was 129/84.

## 2018-08-19 NOTE — Telephone Encounter (Signed)
Aware, thanks!

## 2018-08-22 ENCOUNTER — Encounter (INDEPENDENT_AMBULATORY_CARE_PROVIDER_SITE_OTHER): Payer: Medicare Other | Admitting: Ophthalmology

## 2018-09-19 ENCOUNTER — Encounter (INDEPENDENT_AMBULATORY_CARE_PROVIDER_SITE_OTHER): Payer: Medicare Other | Admitting: Ophthalmology

## 2018-09-19 ENCOUNTER — Other Ambulatory Visit: Payer: Self-pay

## 2018-09-19 DIAGNOSIS — H35033 Hypertensive retinopathy, bilateral: Secondary | ICD-10-CM

## 2018-09-19 DIAGNOSIS — E11311 Type 2 diabetes mellitus with unspecified diabetic retinopathy with macular edema: Secondary | ICD-10-CM

## 2018-09-19 DIAGNOSIS — E113211 Type 2 diabetes mellitus with mild nonproliferative diabetic retinopathy with macular edema, right eye: Secondary | ICD-10-CM | POA: Diagnosis not present

## 2018-09-19 DIAGNOSIS — I10 Essential (primary) hypertension: Secondary | ICD-10-CM | POA: Diagnosis not present

## 2018-09-19 DIAGNOSIS — E113292 Type 2 diabetes mellitus with mild nonproliferative diabetic retinopathy without macular edema, left eye: Secondary | ICD-10-CM

## 2018-09-19 DIAGNOSIS — H353122 Nonexudative age-related macular degeneration, left eye, intermediate dry stage: Secondary | ICD-10-CM

## 2018-09-19 DIAGNOSIS — H43812 Vitreous degeneration, left eye: Secondary | ICD-10-CM | POA: Diagnosis not present

## 2018-11-14 ENCOUNTER — Telehealth: Payer: Self-pay | Admitting: Family Medicine

## 2018-11-14 DIAGNOSIS — E7800 Pure hypercholesterolemia, unspecified: Secondary | ICD-10-CM

## 2018-11-14 DIAGNOSIS — I10 Essential (primary) hypertension: Secondary | ICD-10-CM

## 2018-11-14 DIAGNOSIS — E1159 Type 2 diabetes mellitus with other circulatory complications: Secondary | ICD-10-CM

## 2018-11-14 DIAGNOSIS — D72825 Bandemia: Secondary | ICD-10-CM

## 2018-11-14 NOTE — Telephone Encounter (Signed)
-----   Message from Ellamae Sia sent at 11/09/2018 11:47 AM EDT ----- Regarding: Lab orders for Tuesday, 6.23.20 Patient is scheduled for CPX labs, please order future labs, Thanks , Karna Christmas

## 2018-11-15 ENCOUNTER — Other Ambulatory Visit: Payer: Self-pay

## 2018-11-15 ENCOUNTER — Other Ambulatory Visit (INDEPENDENT_AMBULATORY_CARE_PROVIDER_SITE_OTHER): Payer: Medicare Other

## 2018-11-15 DIAGNOSIS — E7800 Pure hypercholesterolemia, unspecified: Secondary | ICD-10-CM | POA: Diagnosis not present

## 2018-11-15 DIAGNOSIS — E1159 Type 2 diabetes mellitus with other circulatory complications: Secondary | ICD-10-CM | POA: Diagnosis not present

## 2018-11-15 DIAGNOSIS — I10 Essential (primary) hypertension: Secondary | ICD-10-CM | POA: Diagnosis not present

## 2018-11-15 LAB — CBC WITH DIFFERENTIAL/PLATELET
Basophils Absolute: 0.1 10*3/uL (ref 0.0–0.1)
Basophils Relative: 1 % (ref 0.0–3.0)
Eosinophils Absolute: 0.2 10*3/uL (ref 0.0–0.7)
Eosinophils Relative: 2.1 % (ref 0.0–5.0)
HCT: 42.7 % (ref 36.0–46.0)
Hemoglobin: 14.2 g/dL (ref 12.0–15.0)
Lymphocytes Relative: 19.8 % (ref 12.0–46.0)
Lymphs Abs: 2 10*3/uL (ref 0.7–4.0)
MCHC: 33.2 g/dL (ref 30.0–36.0)
MCV: 95.3 fl (ref 78.0–100.0)
Monocytes Absolute: 0.8 10*3/uL (ref 0.1–1.0)
Monocytes Relative: 8 % (ref 3.0–12.0)
Neutro Abs: 7.1 10*3/uL (ref 1.4–7.7)
Neutrophils Relative %: 69.1 % (ref 43.0–77.0)
Platelets: 194 10*3/uL (ref 150.0–400.0)
RBC: 4.48 Mil/uL (ref 3.87–5.11)
RDW: 14.3 % (ref 11.5–15.5)
WBC: 10.3 10*3/uL (ref 4.0–10.5)

## 2018-11-15 LAB — COMPREHENSIVE METABOLIC PANEL
ALT: 12 U/L (ref 0–35)
AST: 14 U/L (ref 0–37)
Albumin: 4.1 g/dL (ref 3.5–5.2)
Alkaline Phosphatase: 61 U/L (ref 39–117)
BUN: 18 mg/dL (ref 6–23)
CO2: 30 mEq/L (ref 19–32)
Calcium: 10 mg/dL (ref 8.4–10.5)
Chloride: 101 mEq/L (ref 96–112)
Creatinine, Ser: 0.7 mg/dL (ref 0.40–1.20)
GFR: 78.82 mL/min (ref >60.00)
Glucose, Bld: 114 mg/dL — ABNORMAL HIGH (ref 70–99)
Potassium: 4.2 mEq/L (ref 3.5–5.1)
Sodium: 139 mEq/L (ref 135–145)
Total Bilirubin: 0.6 mg/dL (ref 0.2–1.2)
Total Protein: 6.9 g/dL (ref 6.0–8.3)

## 2018-11-15 LAB — HEMOGLOBIN A1C: Hgb A1c MFr Bld: 6.9 % — ABNORMAL HIGH (ref 4.6–6.5)

## 2018-11-15 LAB — LIPID PANEL
Cholesterol: 123 mg/dL (ref 0–200)
HDL: 47 mg/dL (ref >39.00)
LDL Cholesterol: 51 mg/dL (ref 0–99)
NonHDL: 76.28
Total CHOL/HDL Ratio: 3
Triglycerides: 128 mg/dL (ref 0.0–149.0)
VLDL: 25.6 mg/dL (ref 0.0–40.0)

## 2018-11-15 LAB — TSH: TSH: 8.59 u[IU]/mL — ABNORMAL HIGH (ref 0.35–4.50)

## 2018-11-22 ENCOUNTER — Encounter: Payer: Self-pay | Admitting: Family Medicine

## 2018-11-22 ENCOUNTER — Other Ambulatory Visit: Payer: Self-pay

## 2018-11-22 ENCOUNTER — Ambulatory Visit (INDEPENDENT_AMBULATORY_CARE_PROVIDER_SITE_OTHER): Payer: Medicare Other | Admitting: Family Medicine

## 2018-11-22 ENCOUNTER — Ambulatory Visit (INDEPENDENT_AMBULATORY_CARE_PROVIDER_SITE_OTHER): Payer: Medicare Other

## 2018-11-22 VITALS — BP 129/76 | HR 92 | Temp 98.2°F | Wt 107.9 lb

## 2018-11-22 DIAGNOSIS — M8580 Other specified disorders of bone density and structure, unspecified site: Secondary | ICD-10-CM | POA: Diagnosis not present

## 2018-11-22 DIAGNOSIS — E039 Hypothyroidism, unspecified: Secondary | ICD-10-CM

## 2018-11-22 DIAGNOSIS — E1169 Type 2 diabetes mellitus with other specified complication: Secondary | ICD-10-CM

## 2018-11-22 DIAGNOSIS — R634 Abnormal weight loss: Secondary | ICD-10-CM

## 2018-11-22 DIAGNOSIS — Z0000 Encounter for general adult medical examination without abnormal findings: Secondary | ICD-10-CM | POA: Diagnosis not present

## 2018-11-22 DIAGNOSIS — E785 Hyperlipidemia, unspecified: Secondary | ICD-10-CM | POA: Diagnosis not present

## 2018-11-22 DIAGNOSIS — I10 Essential (primary) hypertension: Secondary | ICD-10-CM

## 2018-11-22 DIAGNOSIS — E1142 Type 2 diabetes mellitus with diabetic polyneuropathy: Secondary | ICD-10-CM

## 2018-11-22 DIAGNOSIS — E038 Other specified hypothyroidism: Secondary | ICD-10-CM

## 2018-11-22 DIAGNOSIS — I69359 Hemiplegia and hemiparesis following cerebral infarction affecting unspecified side: Secondary | ICD-10-CM

## 2018-11-22 DIAGNOSIS — E1140 Type 2 diabetes mellitus with diabetic neuropathy, unspecified: Secondary | ICD-10-CM | POA: Insufficient documentation

## 2018-11-22 MED ORDER — CLOPIDOGREL BISULFATE 75 MG PO TABS: ORAL_TABLET | ORAL | 3 refills | Status: DC

## 2018-11-22 MED ORDER — PANTOPRAZOLE SODIUM 40 MG PO TBEC: 40.0000 mg | DELAYED_RELEASE_TABLET | Freq: Every day | ORAL | 3 refills | Status: AC

## 2018-11-22 MED ORDER — GABAPENTIN 100 MG PO CAPS: 100.0000 mg | ORAL_CAPSULE | Freq: Every day | ORAL | 3 refills | Status: DC

## 2018-11-22 MED ORDER — SIMVASTATIN 20 MG PO TABS: 20.0000 mg | ORAL_TABLET | Freq: Every day | ORAL | 3 refills | Status: AC

## 2018-11-22 MED ORDER — LEVOTHYROXINE SODIUM 50 MCG PO TABS: 50.0000 ug | ORAL_TABLET | Freq: Every day | ORAL | 3 refills | Status: AC

## 2018-11-22 MED ORDER — METFORMIN HCL 500 MG PO TABS: 250.0000 mg | ORAL_TABLET | Freq: Two times a day (BID) | ORAL | 3 refills | Status: AC

## 2018-11-22 MED ORDER — CITALOPRAM HYDROBROMIDE 10 MG PO TABS: 5.0000 mg | ORAL_TABLET | Freq: Every day | ORAL | 3 refills | Status: AC

## 2018-11-22 NOTE — Progress Notes (Signed)
Virtual Visit via Video Note  I connected with Lauren [name] on 11/22/18 at 11:30 AM EDT by a video enabled telemedicine application and verified that I am speaking with the correct person using two identifiers.  Location: Patient: home Provider: office    I discussed the limitations of evaluation and management by telemedicine and the availability of in person appointments. The patient expressed understanding and agreed to proceed.  History of Present Illness: Pt presents for annual f/u of chronic health problems   Weight -concerns  Wt Readings from Last 3 Encounters:  11/22/18 107 lb 14.4 oz (48.9 kg)  08/01/18 107 lb 5 oz (48.7 kg)  06/14/18 112 lb 8 oz (51 kg)  has lost some more weight  This am was 107.9  About the same  Eating well- but it takes a while   Mammogram 8/19 Self breast exam -no new lumps or changes  Has personal hx of breast cancer   Eye exam 11/19 (she goes for injections in [facility] eye from Dr Zigmund Daniel in august again) Would like vision to be better    dexa 8/19 -stable osteopenia  Falls-none Fractures -none  femara and evista in the past   bp is stable today  No cp or palpitations or headaches or edema  No side effects to medicines  BP Readings from Last 3 Encounters:  11/22/18 129/76  08/01/18 136/70  06/14/18 120/70     DM2 Lab Results  Component Value Date   HGBA1C 6.9 (H) 11/15/2018  up from 6.4  Glucose 118 this am No low glucose levels  Metformin  Simvastatin   Subclinical hypothyroid Hypothyroidism  Pt has no clinical changes  Lab Results  Component Value Date   TSH 8.59 (H) 11/15/2018    Sluggish and tired  Up considerably    hyperlipidemia Lab Results  Component Value Date   CHOL 123 11/15/2018   CHOL 132 11/09/2017   CHOL 134 08/31/2017   Lab Results  Component Value Date   HDL 47.00 11/15/2018   HDL 44.00 11/09/2017   HDL 39.00 (L) 08/31/2017   Lab Results  Component Value Date   LDLCALC 51 11/15/2018    LDLCALC 57 11/09/2017   LDLCALC 66 08/31/2017   Lab Results  Component Value Date   TRIG 128.0 11/15/2018   TRIG 156.0 (H) 11/09/2017   TRIG 147.0 08/31/2017   Lab Results  Component Value Date   CHOLHDL 3 11/15/2018   CHOLHDL 3 11/09/2017   CHOLHDL 3 08/31/2017   Lab Results  Component Value Date   LDLDIRECT 49.0 02/23/2017   LDLDIRECT 76.2 04/24/2014   LDLDIRECT 46.3 10/01/2009   Statin and diet  Very well controlled with current regimen   She struggles with fine motor skills /hands since her stroke  Was trying to fold sheets-has a hard time matching corners  occ also trouble putting thoughts together / speech is still slow  Does not think there was another stroke   Bottoms of her feet hurt a bit at night  Not when up  Burning sensation   Review of Systems  Constitutional: Positive for malaise/fatigue. Negative for chills, diaphoresis, fever and weight loss.  HENT: Positive for hearing loss.   Eyes: Positive for blurred vision. Negative for discharge and redness.  Respiratory: Negative for cough, shortness of breath and wheezing.   Cardiovascular: Negative for chest pain and palpitations.  Gastrointestinal: Positive for constipation. Negative for heartburn and nausea.  Genitourinary: Negative for dysuria.  Musculoskeletal: Positive for joint pain.  Skin: Negative for rash.  Neurological: Positive for focal weakness. Negative for dizziness and headaches.  Endo/Heme/Allergies: Negative for polydipsia.  Psychiatric/Behavioral: Negative for depression. The patient is not nervous/anxious.        Slowed cognition     Patient Active Problem List   Diagnosis Date Noted  . Diabetic neuropathy ([place]) 11/22/2018  . Weight loss 08/01/2018  . Bloating 11/30/2017  . Hemiparesis due to recent stroke ([place]) 09/07/2017  . Hypothyroid 03/04/2017  . Diastolic dysfunction 12/87/8676  . Urinary frequency 09/17/2016  . H/O: CVA (cerebrovascular accident) 08/15/2016  . HTN  (hypertension) 08/15/2016  . GERD (gastroesophageal reflux disease) 08/06/2015  . Hearing loss 08/06/2015  . Estrogen deficiency 05/01/2014  . Left knee pain 09/22/2013  . Encounter for Medicare annual wellness exam 04/04/2013  . Colon cancer screening 04/04/2013  . History of melanoma excision 09/27/2012  . History of breast cancer, left 01/08/2011  . Chronic abdominal pain 09/17/2010  . History of pelvic fracture 09/17/2010  . DEGENERATIVE JOINT DISEASE 02/13/2010  . Migraine variant 11/06/2009  . Osteopenia 10/18/2008  . Transient ischemic attack 09/10/2008  . DM type 2 with diabetic peripheral neuropathy ([place]) 10/06/2007  . VENOUS INSUFFICIENCY 10/06/2007  . Diverticulosis of colon 10/06/2007  . FIBROCYSTIC BREAST DISEASE 10/06/2007  . Hyperlipidemia associated with type 2 diabetes mellitus ([place]) 11/29/2006   Past Medical History:  Diagnosis Date  . Breast cancer ([place])    left breast  . Chronic constipation   . Chronic headaches   . Diabetes mellitus   . Fibrocystic breast   . Gastritis   . Gout    injection in the past  . Hyperlipidemia   . Melanoma in situ of left upper extremity ([place])   . Pelvic fracture ([place])   . TIA (transient ischemic attack) 08/2008   neg MRI/MRA and CT and carotid dopplers  . Venous insufficiency    Past Surgical History:  Procedure Laterality Date  . APPENDECTOMY    . ARM SKIN LESION BIOPSY / EXCISION Right 11/02/2016  . BREAST BIOPSY Left 10/18/2007  . BREAST CYST ASPIRATION    . BREAST EXCISIONAL BIOPSY Right    x2  . BREAST LUMPECTOMY  09/2007   with sentinel LN biopsy  . BREAST LUMPECTOMY     left  . EYE SURGERY     retinal, then cataract   Social History   Tobacco Use  . Smoking status: Never Smoker  . Smokeless tobacco: Never Used  Substance Use Topics  . Alcohol use: No    Alcohol/week: 0.0 standard drinks  . Drug use: No   Family History  Problem Relation Age of Onset  . Breast cancer Sister   . Stroke Sister    . Cancer Sister        breast  . Breast cancer Sister   . Stroke Sister   . Cancer Sister        breast  . Stroke Mother   . Stroke Father   . Stroke Brother   . Cancer Brother        prostate  . Stroke Brother   . Stroke Sister   . CAD Sister    Allergies  Allergen Reactions  . Sulfonamide Derivatives Itching  . Aspirin Other (See Comments)    Burning and pain in stomach   Current Outpatient Medications on File Prior to Visit  Medication Sig Dispense Refill  . acetaminophen (TYLENOL) 325 MG tablet Take 2 tablets (650 mg total) by mouth every 6 (six)  hours as needed for mild pain (or Fever >/= 101).    . benzonatate (TESSALON) 200 MG capsule Take 1 capsule (200 mg total) by mouth 3 (three) times daily as needed. 30 capsule 1  . Besifloxacin HCl (BESIVANCE) 0.6 % SUSP Apply 1 drop to eye 4 (four) times daily. PLACE 1 DROP INTO THE RIGHT EYE FOUR TIMES A DAY FOR 2 DAYS FOLLOWING EACH EYE    . Blood Glucose Monitoring Suppl (FREESTYLE LITE) DEVI USE AS DIRECTED TO CHECK BLOOD SUGAR ONCE A DAY AND AS NEEDED (DX. E11.9) 1 each 0  . calcium carbonate (TUMS - DOSED IN MG ELEMENTAL CALCIUM) 500 MG chewable tablet Chew 1 tablet by mouth as needed for heartburn.     . dicyclomine (BENTYL) 10 MG capsule Take 10 mg by mouth 2 (two) times daily.    . fluticasone (FLONASE) 50 MCG/ACT nasal spray Place into both nostrils daily.    Marland Kitchen glucose blood (FREESTYLE LITE) test strip USE AS DIRECTED TO CHECK BLOOD SUGAR ONCE A DAY AND AS NEEDED (DX. E11.9) 100 each 1  . Lancets (FREESTYLE) lancets USE AS DIRECTED TO CHECK BLOOD SUGAR ONCE A DAY AND AS NEEDED (DX. E11.9) 100 each 1  . Multiple Vitamins-Minerals (PRESERVISION AREDS 2 PO) Take 1 tablet by mouth 2 (two) times daily.    . polyethylene glycol powder (GLYCOLAX/MIRALAX) powder DISSOLVE ONE (1) CAPFUL (17GM) INTO 4 TO8 OUNCES OF FLUID AND TAKE BYMOUTH ONCE DAILY AS DIRECTED 527 g 1  . Probiotic Product (ALIGN PO) Take by mouth daily.    .  ranitidine (ZANTAC) 300 MG tablet Take 1 tablet (300 mg total) by mouth daily as needed for heartburn. 90 tablet 3  . simethicone (MYLICON) 80 MG chewable tablet Chew 80 mg by mouth every 6 (six) hours as needed for flatulence.     No current facility-administered medications on file prior to visit.     Observations/Objective: Patient appears well, in no distress, frail appearing elderly female  Weight is baseline (slim)  No facial swelling or asymmetry Normal voice-not hoarse and no slurred speech (speech is slow however)  No obvious tremor at rest  Mobility impaired  Moving neck and UEs normally Able to hear the call fairly with help of family (who also helps with hx) No cough or shortness of breath during interview  Talkative and mentally sharp with no cognitive changes No skin changes on face or neck , no rash or pallor Affect is normal    Assessment and Plan: Problem List Items Addressed This Visit      Cardiovascular and Mediastinum   HTN (hypertension)    bp in fair control at this time  BP Readings from Last 1 Encounters:  11/22/18 129/76   No changes needed Most recent labs reviewed  Disc lifstyle change with low sodium diet and exercise        Relevant Medications   simvastatin (ZOCOR) 20 MG tablet   Hemiparesis due to recent stroke ([place])    Fine motor skills /hands and speech get slower when tired       Relevant Medications   simvastatin (ZOCOR) 20 MG tablet     Endocrine   Hyperlipidemia associated with type 2 diabetes mellitus ([place])    Disc goals for lipids and reasons to control them Rev last labs with pt Rev low sat fat diet in detail Well controlled with statin and diet       Relevant Medications   metFORMIN (GLUCOPHAGE) 500 MG tablet  simvastatin (ZOCOR) 20 MG tablet   Hypothyroid    Lab Results  Component Value Date   TSH 8.59 (H) 11/15/2018   More sluggish Will start levothyroxine 50 mcg daily in am 30 min before food/supplements Re  check TSH in 6 wk      Relevant Medications   levothyroxine (SYNTHROID) 50 MCG tablet     Nervous and Auditory   DM type 2 with diabetic peripheral neuropathy ([place]) - Primary    Lab Results  Component Value Date   HGBA1C 6.9 (H) 11/15/2018   Overall stable and well controlled Enc her not to skip meals utd eye care  Taking statin       Relevant Medications   citalopram (CELEXA) 10 MG tablet   metFORMIN (GLUCOPHAGE) 500 MG tablet   simvastatin (ZOCOR) 20 MG tablet   gabapentin (NEURONTIN) 100 MG capsule   Diabetic neuropathy ([place])    This is new /good foot care Trial of gabapentin 100 mg at bedtime to help with discomfort Disc close obs for dizziness/sedation with this       Relevant Medications   metFORMIN (GLUCOPHAGE) 500 MG tablet   simvastatin (ZOCOR) 20 MG tablet     Musculoskeletal and Integument   Osteopenia    Stable last dexa No falls or fx On ca and D Enc activity as tol  femara and evista in the past         Other   Weight loss    Maintaining wt now with regular meals           Follow Up Instructions: Continue regular meals and fluids Stay as active as tolerated Start levothyroxine as directed- we will re check TSH in 6 weeks Follow up for regular visit in 6 months   Try gabapentin at bedtime for symptoms of diabetic neuropathy    I discussed the assessment and treatment plan with the patient. The patient was provided an opportunity to ask questions and all were answered. The patient agreed with the plan and demonstrated an understanding of the instructions.   The patient was advised to call back or seek an in-person evaluation if the symptoms worsen or if the condition fails to improve as anticipated.     [name], MD

## 2018-11-22 NOTE — Assessment & Plan Note (Signed)
Disc goals for lipids and reasons to control them Rev last labs with pt Rev low sat fat diet in detail Well controlled with statin and diet

## 2018-11-22 NOTE — Assessment & Plan Note (Signed)
Lab Results  Component Value Date   HGBA1C 6.9 (H) 11/15/2018   Overall stable and well controlled Enc her not to skip meals utd eye care  Taking statin

## 2018-11-22 NOTE — Assessment & Plan Note (Signed)
Stable last dexa No falls or fx On ca and D Enc activity as tol  femara and evista in the past

## 2018-11-22 NOTE — Patient Instructions (Signed)
Continue regular meals and fluids Stay as active as tolerated Start levothyroxine as directed- we will re check TSH in 6 weeks Follow up for regular visit in 6 months   Try gabapentin at bedtime for symptoms of diabetic neuropathy

## 2018-11-22 NOTE — Patient Instructions (Signed)
Ms. Lauren [name] , Thank you for taking time to come for your Medicare Wellness Visit. I appreciate your ongoing commitment to your health goals. Please review the following plan we discussed and let me know if I can assist you in the future.   These are the goals we discussed: Goals    . Patient Stated     Starting 11/22/2018, I will continue to take medications as prescribed.         This is a list of the screening recommended for you and due dates:  Health Maintenance  Topic Date Due  . Urine Protein Check  05/25/2019*  . Tetanus Vaccine  10/26/2024*  . Complete foot exam   12/01/2018  . Flu Shot  12/24/2018  . Mammogram  01/15/2019  . Eye exam for diabetics  04/13/2019  . Hemoglobin A1C  05/17/2019  . DEXA scan (bone density measurement)  Completed  . Pneumonia vaccines  Completed  *Topic was postponed. The date shown is not the original due date.   Preventive Care for Adults  A healthy lifestyle and preventive care can promote health and wellness. Preventive health guidelines for adults include the following key practices.  . A routine yearly physical is a good way to check with your health care provider about your health and preventive screening. It is a chance to share any concerns and updates on your health and to receive a thorough exam.  . Visit your dentist for a routine exam and preventive care every 6 months. Brush your teeth twice a day and floss once a day. Good oral hygiene prevents tooth decay and gum disease.  . The frequency of eye exams is based on your age, health, family medical history, use  of contact lenses, and other factors. Follow your health care provider's recommendations for frequency of eye exams.  . Eat a healthy diet. Foods like vegetables, fruits, whole grains, low-fat dairy products, and lean protein foods contain the nutrients you need without too many calories. Decrease your intake of foods high in solid fats, added sugars, and salt. Eat the right  amount of calories for you. Get information about a proper diet from your health care provider, if necessary.  . Regular physical exercise is one of the most important things you can do for your health. Most adults should get at least 150 minutes of moderate-intensity exercise (any activity that increases your heart rate and causes you to sweat) each week. In addition, most adults need muscle-strengthening exercises on 2 or more days a week.  Silver Sneakers may be a benefit available to you. To determine eligibility, you may visit the website: www.silversneakers.com or contact program at 5046234671 Mon-Fri between 8AM-8PM.   . Maintain a healthy weight. The body mass index (BMI) is a screening tool to identify possible weight problems. It provides an estimate of body fat based on height and weight. Your health care provider can find your BMI and can help you achieve or maintain a healthy weight.   For adults 20 years and older: ? A BMI below 18.5 is considered underweight. ? A BMI of 18.5 to 24.9 is normal. ? A BMI of 25 to 29.9 is considered overweight. ? A BMI of 30 and above is considered obese.   . Maintain normal blood lipids and cholesterol levels by exercising and minimizing your intake of saturated fat. Eat a balanced diet with plenty of fruit and vegetables. Blood tests for lipids and cholesterol should begin at age 14 and be repeated  every 5 years. If your lipid or cholesterol levels are high, you are over 50, or you are at high risk for heart disease, you may need your cholesterol levels checked more frequently. Ongoing high lipid and cholesterol levels should be treated with medicines if diet and exercise are not working.  . If you smoke, find out from your health care provider how to quit. If you do not use tobacco, please do not start.  . If you choose to drink alcohol, please do not consume more than 2 drinks per day. One drink is considered to be 12 ounces (355 mL) of beer, 5  ounces (148 mL) of wine, or 1.5 ounces (44 mL) of liquor.  . If you are 65-97 years old, ask your health care provider if you should take aspirin to prevent strokes.  . Use sunscreen. Apply sunscreen liberally and repeatedly throughout the day. You should seek shade when your shadow is shorter than you. Protect yourself by wearing long sleeves, pants, a wide-brimmed hat, and sunglasses year round, whenever you are outdoors.  . Once a month, do a whole body skin exam, using a mirror to look at the skin on your back. Tell your health care provider of new moles, moles that have irregular borders, moles that are larger than a pencil eraser, or moles that have changed in shape or color.

## 2018-11-22 NOTE — Progress Notes (Signed)
PCP notes:   Health maintenance:  Microalbumin - order generated  Abnormal screenings:   None  Patient concerns:   Niece expressed concerns about patient's memory and her shaky hands  Patient expressed concerns about foot pain  Nurse concerns:  None  Next PCP appt:   11/22/18 @ 1130  I reviewed health advisor's note, was available for consultation, and agree with documentation and plan. [name] MD

## 2018-11-22 NOTE — Assessment & Plan Note (Signed)
Lab Results  Component Value Date   TSH 8.59 (H) 11/15/2018   More sluggish Will start levothyroxine 50 mcg daily in am 30 min before food/supplements Re check TSH in 6 wk

## 2018-11-22 NOTE — Assessment & Plan Note (Signed)
Fine motor skills /hands and speech get slower when tired

## 2018-11-22 NOTE — Assessment & Plan Note (Signed)
This is new /good foot care Trial of gabapentin 100 mg at bedtime to help with discomfort Disc close obs for dizziness/sedation with this

## 2018-11-22 NOTE — Progress Notes (Signed)
Subjective:   Lauren [name] is a 85 y.o. female who presents for Medicare Annual (Subsequent) preventive examination.  Review of Systems:  N/A Cardiac Risk Factors include: advanced age (>47men, >2 women);diabetes mellitus;dyslipidemia     Objective:     Vitals: BP 129/76 Comment: patient supplied vitals  Pulse 92   Temp 98.2 F (36.8 C)   Wt 107 lb 14.4 oz (48.9 kg)   SpO2 97%   BMI 19.11 kg/m   Body mass index is 19.11 kg/m.  Advanced Directives 11/22/2018 11/09/2017 11/03/2016 08/15/2016 02/28/2016 02/28/2016 01/16/2016  Does Patient Have a Medical Advance Directive? Yes Yes Yes No Yes No Yes  Type of Paramedic of [place];Living will [place];Living will [place];Living will - Living will;Healthcare Power of [place];Living will  Does patient want to make changes to medical advance directive? No - Patient declined - - - No - Patient declined - -  Copy of [place] in Chart? No - copy requested No - copy requested No - copy requested - - - No - copy requested  Would patient like information on creating a medical advance directive? - - - - - No - patient declined information -  Pre-existing out of facility DNR order (yellow form or pink MOST form) - - - - - - -    Tobacco Social History   Tobacco Use  Smoking Status Never Smoker  Smokeless Tobacco Never Used     Counseling given: No   Clinical Intake:  Pre-visit preparation completed: Yes  Pain : No/denies pain Pain Score: 0-No pain     Nutritional Status: BMI of 19-24  Normal Nutritional Risks: None Diabetes: No  How often do you need to have someone help you when you read instructions, pamphlets, or other written materials from your doctor or pharmacy?: 3 - Sometimes  Interpreter Needed?: No  Comments: pt lives independently with caregiver Information entered by :: LPinson, RN  Past Medical  History:  Diagnosis Date  . Breast cancer ([place])    left breast  . Chronic constipation   . Chronic headaches   . Diabetes mellitus   . Fibrocystic breast   . Gastritis   . Gout    injection in the past  . Hyperlipidemia   . Melanoma in situ of left upper extremity ([place])   . Pelvic fracture ([place])   . TIA (transient ischemic attack) 08/2008   neg MRI/MRA and CT and carotid dopplers  . Venous insufficiency    Past Surgical History:  Procedure Laterality Date  . APPENDECTOMY    . ARM SKIN LESION BIOPSY / EXCISION Right 11/02/2016  . BREAST BIOPSY Left 10/18/2007  . BREAST CYST ASPIRATION    . BREAST EXCISIONAL BIOPSY Right    x2  . BREAST LUMPECTOMY  09/2007   with sentinel LN biopsy  . BREAST LUMPECTOMY     left  . EYE SURGERY     retinal, then cataract   Family History  Problem Relation Age of Onset  . Breast cancer Sister   . Stroke Sister   . Cancer Sister        breast  . Breast cancer Sister   . Stroke Sister   . Cancer Sister        breast  . Stroke Mother   . Stroke Father   . Stroke Brother   . Cancer Brother  prostate  . Stroke Brother   . Stroke Sister   . CAD Sister    Social History   Socioeconomic History  . Marital status: Single    Spouse name: Not on file  . Number of children: Not on file  . Years of education: college  . Highest education level: Not on file  Occupational History  . Not on file  Social Needs  . Financial resource strain: Not on file  . Food insecurity    Worry: Not on file    Inability: Not on file  . Transportation needs    Medical: Not on file    Non-medical: Not on file  Tobacco Use  . Smoking status: Never Smoker  . Smokeless tobacco: Never Used  Substance and Sexual Activity  . Alcohol use: No    Alcohol/week: 0.0 standard drinks  . Drug use: No  . Sexual activity: Not Currently  Lifestyle  . Physical activity    Days per week: Not on file    Minutes per session: Not on file  . Stress: Not on  file  Relationships  . Social Herbalist on phone: Not on file    Gets together: Not on file    Attends religious service: Not on file    Active member of club or organization: Not on file    Attends meetings of clubs or organizations: Not on file    Relationship status: Not on file  Other Topics Concern  . Not on file  Social History Narrative   Lives with and cares for both sisters   Strong faith   Denies caffeine use     Outpatient Encounter Medications as of 11/22/2018  Medication Sig  . acetaminophen (TYLENOL) 325 MG tablet Take 2 tablets (650 mg total) by mouth every 6 (six) hours as needed for mild pain (or Fever >/= 101).  . benzonatate (TESSALON) 200 MG capsule Take 1 capsule (200 mg total) by mouth 3 (three) times daily as needed.  Marland Kitchen Besifloxacin HCl (BESIVANCE) 0.6 % SUSP Apply 1 drop to eye 4 (four) times daily. PLACE 1 DROP INTO THE RIGHT EYE FOUR TIMES A DAY FOR 2 DAYS FOLLOWING EACH EYE  . Blood Glucose Monitoring Suppl (FREESTYLE LITE) DEVI USE AS DIRECTED TO CHECK BLOOD SUGAR ONCE A DAY AND AS NEEDED (DX. E11.9)  . calcium carbonate (TUMS - DOSED IN MG ELEMENTAL CALCIUM) 500 MG chewable tablet Chew 1 tablet by mouth as needed for heartburn.   . dicyclomine (BENTYL) 10 MG capsule Take 10 mg by mouth 2 (two) times daily.  . fluticasone (FLONASE) 50 MCG/ACT nasal spray Place into both nostrils daily.  Marland Kitchen glucose blood (FREESTYLE LITE) test strip USE AS DIRECTED TO CHECK BLOOD SUGAR ONCE A DAY AND AS NEEDED (DX. E11.9)  . Lancets (FREESTYLE) lancets USE AS DIRECTED TO CHECK BLOOD SUGAR ONCE A DAY AND AS NEEDED (DX. E11.9)  . Multiple Vitamins-Minerals (PRESERVISION AREDS 2 PO) Take 1 tablet by mouth 2 (two) times daily.  . polyethylene glycol powder (GLYCOLAX/MIRALAX) powder DISSOLVE ONE (1) CAPFUL (17GM) INTO 4 TO8 OUNCES OF FLUID AND TAKE BYMOUTH ONCE DAILY AS DIRECTED  . Probiotic Product (ALIGN PO) Take by mouth daily.  . ranitidine (ZANTAC) 300 MG tablet Take  1 tablet (300 mg total) by mouth daily as needed for heartburn.  . simethicone (MYLICON) 80 MG chewable tablet Chew 80 mg by mouth every 6 (six) hours as needed for flatulence.  . [DISCONTINUED] azithromycin (ZITHROMAX) 250  MG tablet Take 2 tablets one day and then 1 tablet daily for 4 days.  . [DISCONTINUED] citalopram (CELEXA) 10 MG tablet Take 0.5 tablets (5 mg total) by mouth daily.  . [DISCONTINUED] clopidogrel (PLAVIX) 75 MG tablet TAKE ONE TABLET BY MOUTH EVERY MORNING WITH BREAKFAST.  . [DISCONTINUED] metFORMIN (GLUCOPHAGE) 500 MG tablet Take 0.5 tablets (250 mg total) by mouth 2 (two) times daily with a meal.  . [DISCONTINUED] pantoprazole (PROTONIX) 40 MG tablet Take 1 tablet (40 mg total) by mouth daily.  . [DISCONTINUED] simvastatin (ZOCOR) 20 MG tablet Take 1 tablet (20 mg total) by mouth at bedtime.   No facility-administered encounter medications on file as of 11/22/2018.     Activities of Daily Living In your present state of health, do you have any difficulty performing the following activities: 11/22/2018  Hearing? N  Vision? N  Difficulty concentrating or making decisions? Y  Walking or climbing stairs? Y  Dressing or bathing? N  Doing errands, shopping? Y  Preparing Food and eating ? Y  Using the Toilet? N  In the past six months, have you accidently leaked urine? Y  Do you have problems with loss of bowel control? N  Managing your Medications? Y  Managing your Finances? Y  Housekeeping or managing your Housekeeping? Y  Some recent data might be hidden    Patient Care Team: Tower, Wynelle Fanny, MD as PCP - General (Family Medicine) Hayden Pedro, MD as Consulting Physician (Ophthalmology) Marygrace Drought, MD as Consulting Physician (Ophthalmology) Rolm Bookbinder, MD as Consulting Physician (Dermatology) Carloyn Manner, MD as Referring Physician (Otolaryngology) Nicholas Lose, MD as Consulting Physician (Hematology and Oncology) Wallene Huh, DPM as Consulting  Physician (Podiatry)    Assessment:   This is a routine wellness examination for [place].   Hearing Screening   125Hz  250Hz  500Hz  1000Hz  2000Hz  3000Hz  4000Hz  6000Hz  8000Hz   Right ear:           Left ear:           Vision Screening Comments: Vision exam in 2019 with Dr. Satira Sark   Exercise Activities and Dietary recommendations Current Exercise Habits: The patient does not participate in regular exercise at present, Exercise limited by: None identified  Goals    . Patient Stated     Starting 11/22/2018, I will continue to take medications as prescribed.         Fall Risk Fall Risk  11/22/2018 02/09/2018 11/09/2017 11/03/2016 09/23/2016  Falls in the past year? 0 No No Yes Yes  Number falls in past yr: - - - 1 1  Injury with Fall? - - - Yes No  Risk for fall due to : - - - - -  Follow up - - - - Falls prevention discussed   Depression Screen PHQ 2/9 Scores 11/22/2018 11/09/2017 11/03/2016 08/02/2015  PHQ - 2 Score 0 0 0 0  PHQ- 9 Score 0 0 - -     Cognitive Function MMSE - Mini Mental State Exam 11/22/2018 02/09/2018 11/09/2017 11/03/2016 08/02/2015  Orientation to time 5 5 5 5 5   Orientation to Place 5 5 5 5 5   Registration 3 3 3 3 3   Attention/ Calculation 0 4 0 0 5  Recall 3 3 3 3 3   Language- name 2 objects 0 2 0 0 0  Language- repeat 1 1 1 1 1   Language- follow 3 step command 0 3 3 3 3   Language- read & follow direction 0 1 0 0 1  Write a sentence 0 1 0 0 0  Copy design 0 1 0 0 0  Total score 17 29 20 20 26      PLEASE NOTE: A Mini-Cog screen was completed. Maximum score is 17. A value of 0 denotes this part of [place] MMSE was not completed or the patient failed this part of the Mini-Cog screening.   Mini-Cog Screening Orientation to Time - Max 5 pts Orientation to Place - Max 5 pts Registration - Max 3 pts Recall - Max 3 pts Language Repeat - Max 1 pts      Immunization History  Administered Date(s) Administered  . Influenza Split 03/03/2011, 03/17/2012  .  Influenza Whole 05/25/2005, 02/28/2009, 02/20/2010  . Influenza,inj,Quad PF,6+ Mos 02/16/2013, 02/07/2014, 02/04/2015, 02/05/2016, 03/03/2017, 02/24/2018  . Pneumococcal Conjugate-13 05/01/2014  . Pneumococcal Polysaccharide-23 10/18/2008  . Td 10/27/2004  . Zoster 01/13/2013    Screening Tests Health Maintenance  Topic Date Due  . URINE MICROALBUMIN  05/25/2019 (Originally 11/10/2018)  . TETANUS/TDAP  10/26/2024 (Originally 10/28/2014)  . FOOT EXAM  12/01/2018  . INFLUENZA VACCINE  12/24/2018  . MAMMOGRAM  01/15/2019  . OPHTHALMOLOGY EXAM  04/13/2019  . HEMOGLOBIN A1C  05/17/2019  . DEXA SCAN  Completed  . PNA vac Low Risk Adult  Completed     Plan:    I have personally reviewed, addressed, and noted the following in the patient's chart:  A. Medical and social history B. Use of alcohol, tobacco or illicit drugs  C. Current medications and supplements D. Functional ability and status E.  Nutritional status F.  Physical activity G. Advance directives H. List of other physicians I.  Hospitalizations, surgeries, and ER visits in previous 12 months J.  Vitals (unless it is a telemedicine encounter) K. Screenings to include cognitive, depression, hearing, vision (NOTE: hearing and vision screenings not completed in telemedicine encounter) L. Referrals and appointments   In addition, I have reviewed and discussed with patient certain preventive protocols, quality metrics, and best practice recommendations. A written personalized care plan for preventive services and recommendations were provided to patient.  With patient's permission, we connected on 11/22/18 at 10:30 AM EDT. Interactive audio and video telecommunications were attempted with patient. This attempt was unsuccessful due to patient having technical difficulties OR patient did not have access to video capability.  Encounter was completed with audio only.  Two patient identifiers were used to ensure the encounter occurred  with the correct person. Patient was in home and writer was in office.     Signed,   Lindell Noe, MHA, BS, RN Health Coach

## 2018-11-22 NOTE — Assessment & Plan Note (Signed)
bp in fair control at this time  BP Readings from Last 1 Encounters:  11/22/18 129/76   No changes needed Most recent labs reviewed  Disc lifstyle change with low sodium diet and exercise

## 2018-11-22 NOTE — Assessment & Plan Note (Signed)
Maintaining wt now with regular meals

## 2018-11-23 ENCOUNTER — Telehealth: Payer: Self-pay | Admitting: Family Medicine

## 2018-11-23 NOTE — Telephone Encounter (Signed)
I left a message for [name], niece, to call back and schedule appointments.  Dr.Tower said  Please schedule non fasting lab for TSH in 6 weeks for hypothyroid  F/u with lab prior in 6 mo

## 2018-11-24 ENCOUNTER — Telehealth: Payer: Self-pay | Admitting: Family Medicine

## 2018-11-24 NOTE — Telephone Encounter (Signed)
That dose time for levothyroxine is fine.  Avoid taking gabapentin as the same time as antacid with aluminum (it will decrease absorption of the gabapentin

## 2018-11-24 NOTE — Telephone Encounter (Signed)
Pt and Linda notified of Dr. Marliss Coots comments and verbalized understanding

## 2018-11-24 NOTE — Telephone Encounter (Signed)
Lauren [name] called concerning recent new medications, Gabapentin & levothyroxine. She wants to know if it is ok to take antacid with aluminum magnesium, also tums, gas-x and calcium. There is a warning on the gabapentin bottle. [name] wants to know if taking levothyroxine at 3-5 am bathroom break is ok. I advised her to call the pharmacy as well.

## 2018-12-15 ENCOUNTER — Telehealth: Payer: Self-pay | Admitting: Family Medicine

## 2018-12-15 NOTE — Telephone Encounter (Signed)
Called [name] back and she advised me that pt fell twice yesterday and today, she didn't injury herself but she did lay on the ground for over an hr before she thought to push her life alert button. [name] is worried about pt's safety an is looking into assisted living facilities. (Pt doesn't know yet). [name] said pt is fine from the fall she didn't have any injuries and she has someone with her now and her vitals are all good. [name] wanted Dr. Glori Bickers to check her out from the fall and also get a Tb skin test. appt scheduled tomorrow at 10:45 and I advised [name] we can do the Tb skin test in the office tomorrow.   [name] will have the assisted [facility] fax over a FL2 for [place] to fill out and she just asked that at tomorrow's appt that Dr. Glori Bickers doesn't mention the assisted living move because the family hasn't told pt yet that's their plan they want to wait until they find a facility and everything is approved and a "go" before they tell her.

## 2018-12-15 NOTE — Telephone Encounter (Signed)
Patient's Niece Lauren [name] called today to discuss a TB test and FL2.   She stated they are discussing putting the patient in a facility. She requested a call back, she has a few other questions that she would like to discuss.   C/B # [phone]

## 2018-12-15 NOTE — Telephone Encounter (Signed)
Understood- in that case I may not bring the form into the room (I can do it in my office) after the visit  Were are they looking?  Will it be asst living or nursing care? (it will ask that on the form)  If she can't tell you now we can get that info then

## 2018-12-16 ENCOUNTER — Other Ambulatory Visit: Payer: Self-pay

## 2018-12-16 ENCOUNTER — Encounter: Payer: Self-pay | Admitting: Family Medicine

## 2018-12-16 ENCOUNTER — Ambulatory Visit (INDEPENDENT_AMBULATORY_CARE_PROVIDER_SITE_OTHER): Payer: Medicare Other | Admitting: Family Medicine

## 2018-12-16 VITALS — BP 126/64 | HR 94 | Temp 98.1°F | Ht 63.0 in | Wt 108.6 lb

## 2018-12-16 DIAGNOSIS — I10 Essential (primary) hypertension: Secondary | ICD-10-CM

## 2018-12-16 DIAGNOSIS — Z111 Encounter for screening for respiratory tuberculosis: Secondary | ICD-10-CM | POA: Diagnosis not present

## 2018-12-16 DIAGNOSIS — R350 Frequency of micturition: Secondary | ICD-10-CM | POA: Diagnosis not present

## 2018-12-16 DIAGNOSIS — W19XXXA Unspecified fall, initial encounter: Secondary | ICD-10-CM | POA: Diagnosis not present

## 2018-12-16 DIAGNOSIS — R6889 Other general symptoms and signs: Secondary | ICD-10-CM | POA: Diagnosis not present

## 2018-12-16 DIAGNOSIS — Y92009 Unspecified place in unspecified non-institutional (private) residence as the place of occurrence of the external cause: Secondary | ICD-10-CM

## 2018-12-16 DIAGNOSIS — Z022 Encounter for examination for admission to residential institution: Secondary | ICD-10-CM

## 2018-12-16 DIAGNOSIS — I69359 Hemiplegia and hemiparesis following cerebral infarction affecting unspecified side: Secondary | ICD-10-CM

## 2018-12-16 DIAGNOSIS — Z209 Contact with and (suspected) exposure to unspecified communicable disease: Secondary | ICD-10-CM | POA: Insufficient documentation

## 2018-12-16 DIAGNOSIS — Z20822 Contact with and (suspected) exposure to covid-19: Secondary | ICD-10-CM

## 2018-12-16 NOTE — Progress Notes (Signed)
Subjective:    Patient ID: Lauren [name], female    DOB: Sep 28, 1927, 85 y.o.   MRN: [id number]  HPI Here to discuss a fall (with difficulty getting up )   Also fill out FL2 form (considering a move to safer environment)  Needs PPD for this as well   Wt Readings from Last 3 Encounters:  12/16/18 108 lb 9 oz (49.2 kg)  11/22/18 107 lb 14.4 oz (48.9 kg)  11/22/18 107 lb 14.4 oz (48.9 kg)   19.23 kg/m   BP Readings from Last 3 Encounters:  12/16/18 126/64  11/22/18 129/76  11/22/18 129/76   Pulse Readings from Last 3 Encounters:  12/16/18 94  11/22/18 92  11/22/18 92    Fell yesterday Was in the bathroom getting ready for bed  Backed up and missed the chair  Per family she laid for an hour before she hit the medical alert button  Finally did and friend's husband lifted  Then had to go back to bathroom - fell backwards again (was supportive)  Of note walker does not fit in bathroom   Fell on tailbone  L knee Did not hit head Bruising on her arms   Today she feel fair  A little stiff  Did not break anything   Thinking about asst living move to [facility]  She has friends over there  Big back porch   Needs covid test and TB skin test for admit to [facility]   Had uti this jan Pt desired re check ua the next time she can give a sample    Patient Active Problem List   Diagnosis Date Noted  . Fall at home, initial encounter 12/16/2018  . Exposure to communicable disease 12/16/2018  . Diabetic neuropathy ([place]) 11/22/2018  . Weight loss 08/01/2018  . Bloating 11/30/2017  . Hemiparesis due to recent stroke ([place]) 09/07/2017  . Hypothyroid 03/04/2017  . Diastolic dysfunction 44/07/4740  . Urinary frequency 09/17/2016  . H/O: CVA (cerebrovascular accident) 08/15/2016  . HTN (hypertension) 08/15/2016  . GERD (gastroesophageal reflux disease) 08/06/2015  . Hearing loss 08/06/2015  . Estrogen deficiency 05/01/2014  . Left knee pain 09/22/2013  . Encounter for  Medicare annual wellness exam 04/04/2013  . Colon cancer screening 04/04/2013  . History of melanoma excision 09/27/2012  . History of breast cancer, left 01/08/2011  . Chronic abdominal pain 09/17/2010  . History of pelvic fracture 09/17/2010  . DEGENERATIVE JOINT DISEASE 02/13/2010  . Migraine variant 11/06/2009  . Osteopenia 10/18/2008  . Transient ischemic attack 09/10/2008  . DM type 2 with diabetic peripheral neuropathy ([place]) 10/06/2007  . VENOUS INSUFFICIENCY 10/06/2007  . Diverticulosis of colon 10/06/2007  . FIBROCYSTIC BREAST DISEASE 10/06/2007  . Hyperlipidemia associated with type 2 diabetes mellitus ([place]) 11/29/2006   Past Medical History:  Diagnosis Date  . Breast cancer ([place])    left breast  . Chronic constipation   . Chronic headaches   . Diabetes mellitus   . Fibrocystic breast   . Gastritis   . Gout    injection in the past  . Hyperlipidemia   . Melanoma in situ of left upper extremity ([place])   . Pelvic fracture ([place])   . TIA (transient ischemic attack) 08/2008   neg MRI/MRA and CT and carotid dopplers  . Venous insufficiency    Past Surgical History:  Procedure Laterality Date  . APPENDECTOMY    . ARM SKIN LESION BIOPSY / EXCISION Right 11/02/2016  . BREAST BIOPSY Left 10/18/2007  .  BREAST CYST ASPIRATION    . BREAST EXCISIONAL BIOPSY Right    x2  . BREAST LUMPECTOMY  09/2007   with sentinel LN biopsy  . BREAST LUMPECTOMY     left  . EYE SURGERY     retinal, then cataract   Social History   Tobacco Use  . Smoking status: Never Smoker  . Smokeless tobacco: Never Used  Substance Use Topics  . Alcohol use: No    Alcohol/week: 0.0 standard drinks  . Drug use: No   Family History  Problem Relation Age of Onset  . Breast cancer Sister   . Stroke Sister   . Cancer Sister        breast  . Breast cancer Sister   . Stroke Sister   . Cancer Sister        breast  . Stroke Mother   . Stroke Father   . Stroke Brother   . Cancer Brother         prostate  . Stroke Brother   . Stroke Sister   . CAD Sister    Allergies  Allergen Reactions  . Sulfonamide Derivatives Itching  . Aspirin Other (See Comments)    Burning and pain in stomach   Current Outpatient Medications on File Prior to Visit  Medication Sig Dispense Refill  . acetaminophen (TYLENOL) 325 MG tablet Take 2 tablets (650 mg total) by mouth every 6 (six) hours as needed for mild pain (or Fever >/= 101).    Marland Kitchen Besifloxacin HCl (BESIVANCE) 0.6 % SUSP Apply 1 drop to eye 4 (four) times daily. PLACE 1 DROP INTO THE RIGHT EYE FOUR TIMES A DAY FOR 2 DAYS FOLLOWING EACH EYE    . Blood Glucose Monitoring Suppl (FREESTYLE LITE) DEVI USE AS DIRECTED TO CHECK BLOOD SUGAR ONCE A DAY AND AS NEEDED (DX. E11.9) 1 each 0  . calcium carbonate (TUMS - DOSED IN MG ELEMENTAL CALCIUM) 500 MG chewable tablet Chew 1 tablet by mouth as needed for heartburn.     . citalopram (CELEXA) 10 MG tablet Take 0.5 tablets (5 mg total) by mouth daily. 45 tablet 3  . clopidogrel (PLAVIX) 75 MG tablet TAKE ONE TABLET BY MOUTH EVERY MORNING WITH BREAKFAST. 90 tablet 3  . dicyclomine (BENTYL) 10 MG capsule Take 10 mg by mouth 2 (two) times daily.    . fluticasone (FLONASE) 50 MCG/ACT nasal spray Place into both nostrils daily.    Marland Kitchen glucose blood (FREESTYLE LITE) test strip USE AS DIRECTED TO CHECK BLOOD SUGAR ONCE A DAY AND AS NEEDED (DX. E11.9) 100 each 1  . Lancets (FREESTYLE) lancets USE AS DIRECTED TO CHECK BLOOD SUGAR ONCE A DAY AND AS NEEDED (DX. E11.9) 100 each 1  . levothyroxine (SYNTHROID) 50 MCG tablet Take 1 tablet (50 mcg total) by mouth daily before breakfast. 30 minutes before other medicines or supplements 90 tablet 3  . metFORMIN (GLUCOPHAGE) 500 MG tablet Take 0.5 tablets (250 mg total) by mouth 2 (two) times daily with a meal. 180 tablet 3  . Multiple Vitamins-Minerals (PRESERVISION AREDS 2 PO) Take 1 tablet by mouth 2 (two) times daily.    . pantoprazole (PROTONIX) 40 MG tablet Take 1 tablet  (40 mg total) by mouth daily. 90 tablet 3  . polyethylene glycol powder (GLYCOLAX/MIRALAX) powder DISSOLVE ONE (1) CAPFUL (17GM) INTO 4 TO8 OUNCES OF FLUID AND TAKE BYMOUTH ONCE DAILY AS DIRECTED 527 g 1  . Probiotic Product (ALIGN PO) Take by mouth daily.    Marland Kitchen  ranitidine (ZANTAC) 300 MG tablet Take 1 tablet (300 mg total) by mouth daily as needed for heartburn. 90 tablet 3  . simethicone (MYLICON) 80 MG chewable tablet Chew 80 mg by mouth every 6 (six) hours as needed for flatulence.    . simvastatin (ZOCOR) 20 MG tablet Take 1 tablet (20 mg total) by mouth at bedtime. 90 tablet 3  . gabapentin (NEURONTIN) 100 MG capsule Take 1 capsule (100 mg total) by mouth at bedtime. For diabetic neuropathy (Patient not taking: Reported on 12/16/2018) 90 capsule 3   No current facility-administered medications on file prior to visit.     Review of Systems  Constitutional: Negative for activity change, appetite change, fatigue, fever and unexpected weight change.  HENT: Negative for congestion, ear pain, rhinorrhea, sinus pressure and sore throat.   Eyes: Negative for pain, redness and visual disturbance.  Respiratory: Negative for cough, shortness of breath and wheezing.   Cardiovascular: Negative for chest pain and palpitations.  Gastrointestinal: Negative for abdominal pain, blood in stool, constipation and diarrhea.  Endocrine: Negative for polydipsia and polyuria.  Genitourinary: Negative for dysuria, frequency and urgency.  Musculoskeletal: Negative for arthralgias, back pain and myalgias.  Skin: Negative for pallor and rash.  Allergic/Immunologic: Negative for environmental allergies.  Neurological: Positive for tremors, speech difficulty and weakness. Negative for dizziness, syncope, light-headedness and headaches.       Generalized weakness  Tremor on and off  Slow speech since her last CVA   Hematological: Negative for adenopathy. Does not bruise/bleed easily.  Psychiatric/Behavioral: Negative  for decreased concentration and dysphoric mood. The patient is not nervous/anxious.        Objective:   Physical Exam Constitutional:      General: She is not in acute distress.    Appearance: Normal appearance. She is well-developed and normal weight. She is not ill-appearing or diaphoretic.     Comments: Frail appearing elderly female   HENT:     Head: Normocephalic and atraumatic.     Mouth/Throat:     Mouth: Mucous membranes are moist.     Pharynx: Oropharynx is clear. No posterior oropharyngeal erythema.  Eyes:     General: No scleral icterus.    Conjunctiva/sclera: Conjunctivae normal.     Pupils: Pupils are equal, round, and reactive to light.  Neck:     Musculoskeletal: Normal range of motion and neck supple. No neck rigidity or muscular tenderness.     Thyroid: No thyromegaly.     Vascular: No carotid bruit or JVD.  Cardiovascular:     Rate and Rhythm: Regular rhythm. Tachycardia present.     Pulses: Normal pulses.     Heart sounds: Normal heart sounds. No gallop.   Pulmonary:     Effort: Pulmonary effort is normal. No respiratory distress.     Breath sounds: Normal breath sounds. No wheezing or rales.     Comments: Good air exch Abdominal:     General: Bowel sounds are normal. There is no distension or abdominal bruit.     Palpations: Abdomen is soft. There is no mass.     Tenderness: There is no abdominal tenderness.     Comments: Stomach/bowel noise frequent -is her baseline   Musculoskeletal:     Right lower leg: No edema.     Left lower leg: No edema.  Lymphadenopathy:     Cervical: No cervical adenopathy.  Skin:    General: Skin is warm and dry.     Findings: No rash.  Neurological:  Mental Status: She is alert. Mental status is at baseline.     Motor: Weakness present.     Coordination: Coordination abnormal.     Gait: Gait normal.     Deep Tendon Reflexes: Reflexes are normal and symmetric. Reflexes normal.     Comments: Baseline hemiparesis   Poor balance/coordination  Mild hand tremor   Psychiatric:        Mood and Affect: Mood is anxious.     Comments: Slightly anxious Pleasant            Assessment & Plan:   Problem List Items Addressed This Visit      Cardiovascular and Mediastinum   HTN (hypertension)    bp in fair control at this time  BP Readings from Last 1 Encounters:  12/16/18 126/64   No changes needed Most recent labs reviewed  Disc lifstyle change with low sodium diet and exercise        Hemiparesis due to recent stroke [facility])    With frequent falls despite walker use  Enc consideration of assisted living - agrees  FL2 and PPD today for admission to [facility] Disc fall precautions          Other   Encounter for examination for admission to assisted living facility    PPD today  Also order for covid test done  FL2 completed pending results Wishes to go to [facility]      Urinary frequency    Re check ua today       Relevant Orders   Urine Culture   Fall at home, initial encounter - Primary    Several falls/high risk  Hemiparesis /age/and general deconditioning play a role Continues walker  Plan to admit to [facility] for asst living (agreeable)- she also has friends there      Exposure to communicable disease    PPD needed today for admission to asst living facility       Other Visit Diagnoses    Visit for TB skin test       Relevant Orders   TB Skin Test (Completed)

## 2018-12-16 NOTE — Telephone Encounter (Signed)
Family told pt and they are addressing Dr. Marliss Coots questions at appt

## 2018-12-16 NOTE — Patient Instructions (Signed)
You can go get a Covid test at the [facility] site today -get there before 3:30 Also TB skin test I can finish the FL2 when those return and we will be good   Be careful not to fall Take someone in the bathroom with you if needed

## 2018-12-18 LAB — NOVEL CORONAVIRUS, NAA: SARS-CoV-2, NAA: NOT DETECTED

## 2018-12-18 NOTE — Assessment & Plan Note (Signed)
PPD today  Also order for covid test done  FL2 completed pending results Wishes to go to [facility]

## 2018-12-18 NOTE — Assessment & Plan Note (Signed)
Several falls/high risk  Hemiparesis /age/and general deconditioning play a role Continues walker  Plan to admit to [facility] for asst living (agreeable)- she also has friends there

## 2018-12-18 NOTE — Assessment & Plan Note (Signed)
bp in fair control at this time  BP Readings from Last 1 Encounters:  12/16/18 126/64   No changes needed Most recent labs reviewed  Disc lifstyle change with low sodium diet and exercise

## 2018-12-18 NOTE — Assessment & Plan Note (Signed)
With frequent falls despite walker use  Enc consideration of assisted living - agrees  FL2 and PPD today for admission to [facility] Disc fall precautions

## 2018-12-18 NOTE — Assessment & Plan Note (Signed)
Recheck ua today °

## 2018-12-18 NOTE — Assessment & Plan Note (Signed)
PPD needed today for admission to asst living facility

## 2018-12-19 ENCOUNTER — Other Ambulatory Visit: Payer: Self-pay | Admitting: Family Medicine

## 2018-12-19 LAB — TB SKIN TEST
Induration: 0 mm
TB Skin Test: NEGATIVE

## 2018-12-19 LAB — URINE CULTURE
MICRO NUMBER:: 701600
SPECIMEN QUALITY:: ADEQUATE

## 2018-12-19 MED ORDER — CIPROFLOXACIN HCL 250 MG PO TABS: 250.0000 mg | ORAL_TABLET | Freq: Two times a day (BID) | ORAL | 0 refills | Status: DC

## 2018-12-19 NOTE — Telephone Encounter (Signed)
[name] best number [phone]  Called with fax number to [facility] fax number [phone]  [facility] would like alittle back ground on ms [place]  Medcial history  [name] would also like a copy of the fl2 Please mail to [address] [place] [address]

## 2018-12-19 NOTE — Telephone Encounter (Signed)
Urine culture shows pseudomonas bacteria  I pended px for cipro to take for 5 days Please send to pharmacy pref Remind to hydrate

## 2018-12-19 NOTE — Telephone Encounter (Signed)
Linda notified of urine cx results and Rx sent to pharmacy.   FL2 and info faxed to assisted living and copy mailed to [facility]

## 2018-12-26 ENCOUNTER — Telehealth: Payer: Self-pay | Admitting: Family Medicine

## 2018-12-26 DIAGNOSIS — Z20822 Contact with and (suspected) exposure to covid-19: Secondary | ICD-10-CM | POA: Insufficient documentation

## 2018-12-26 DIAGNOSIS — Z20828 Contact with and (suspected) exposure to other viral communicable diseases: Secondary | ICD-10-CM | POA: Insufficient documentation

## 2018-12-26 NOTE — Telephone Encounter (Signed)
[name] Best number [phone] or [phone]  [name] called stating pt talked to (lisa) at distance (62ft) on 7/26  who was exposed to  Lattie Haw Mother)  that tested positive.    tested on 7/27 results on 7/29. (lisa)   got a neg results on 7/29 but is going to be retested 8/10  PT is going into [facility] assisted living and they want pt to be retested  ASAP.  Can she go this afternoon

## 2018-12-26 NOTE — Telephone Encounter (Signed)
Just saw this  Ordered testing-will have to do tomorrow

## 2018-12-26 NOTE — Telephone Encounter (Signed)
[name] notified order done

## 2018-12-27 ENCOUNTER — Other Ambulatory Visit: Payer: Self-pay

## 2018-12-27 DIAGNOSIS — R6889 Other general symptoms and signs: Secondary | ICD-10-CM | POA: Diagnosis not present

## 2018-12-27 DIAGNOSIS — Z20822 Contact with and (suspected) exposure to covid-19: Secondary | ICD-10-CM

## 2018-12-28 LAB — NOVEL CORONAVIRUS, NAA: SARS-CoV-2, NAA: NOT DETECTED

## 2018-12-28 NOTE — Telephone Encounter (Signed)
Lauren [name] called back to request a copy of the neg covid test be faxed to [facility] [phone] AND a copy mailed to her home.

## 2018-12-29 NOTE — Telephone Encounter (Signed)
Copy mailed and copy faxed to [facility]

## 2019-01-03 ENCOUNTER — Other Ambulatory Visit: Payer: Medicare Other

## 2019-01-04 DIAGNOSIS — R109 Unspecified abdominal pain: Secondary | ICD-10-CM | POA: Diagnosis not present

## 2019-01-05 DIAGNOSIS — D519 Vitamin B12 deficiency anemia, unspecified: Secondary | ICD-10-CM | POA: Diagnosis not present

## 2019-01-05 DIAGNOSIS — R319 Hematuria, unspecified: Secondary | ICD-10-CM | POA: Diagnosis not present

## 2019-01-05 DIAGNOSIS — E785 Hyperlipidemia, unspecified: Secondary | ICD-10-CM | POA: Diagnosis not present

## 2019-01-05 DIAGNOSIS — D649 Anemia, unspecified: Secondary | ICD-10-CM | POA: Diagnosis not present

## 2019-01-05 DIAGNOSIS — Z20828 Contact with and (suspected) exposure to other viral communicable diseases: Secondary | ICD-10-CM | POA: Diagnosis not present

## 2019-01-05 DIAGNOSIS — R358 Other polyuria: Secondary | ICD-10-CM | POA: Diagnosis not present

## 2019-01-05 DIAGNOSIS — E039 Hypothyroidism, unspecified: Secondary | ICD-10-CM | POA: Diagnosis not present

## 2019-01-05 DIAGNOSIS — R300 Dysuria: Secondary | ICD-10-CM | POA: Diagnosis not present

## 2019-01-05 DIAGNOSIS — E119 Type 2 diabetes mellitus without complications: Secondary | ICD-10-CM | POA: Diagnosis not present

## 2019-01-09 ENCOUNTER — Encounter (INDEPENDENT_AMBULATORY_CARE_PROVIDER_SITE_OTHER): Payer: Medicare Other | Admitting: Ophthalmology

## 2019-01-09 ENCOUNTER — Other Ambulatory Visit: Payer: Self-pay

## 2019-01-09 DIAGNOSIS — E113311 Type 2 diabetes mellitus with moderate nonproliferative diabetic retinopathy with macular edema, right eye: Secondary | ICD-10-CM | POA: Diagnosis not present

## 2019-01-09 DIAGNOSIS — H43813 Vitreous degeneration, bilateral: Secondary | ICD-10-CM | POA: Diagnosis not present

## 2019-01-09 DIAGNOSIS — E113392 Type 2 diabetes mellitus with moderate nonproliferative diabetic retinopathy without macular edema, left eye: Secondary | ICD-10-CM | POA: Diagnosis not present

## 2019-01-09 DIAGNOSIS — H3509 Other intraretinal microvascular abnormalities: Secondary | ICD-10-CM | POA: Diagnosis not present

## 2019-01-09 DIAGNOSIS — E11311 Type 2 diabetes mellitus with unspecified diabetic retinopathy with macular edema: Secondary | ICD-10-CM

## 2019-01-09 DIAGNOSIS — H353132 Nonexudative age-related macular degeneration, bilateral, intermediate dry stage: Secondary | ICD-10-CM | POA: Diagnosis not present

## 2019-03-01 DIAGNOSIS — Z20828 Contact with and (suspected) exposure to other viral communicable diseases: Secondary | ICD-10-CM | POA: Diagnosis not present

## 2019-03-08 DIAGNOSIS — Z20828 Contact with and (suspected) exposure to other viral communicable diseases: Secondary | ICD-10-CM | POA: Diagnosis not present

## 2019-03-20 ENCOUNTER — Telehealth: Payer: Self-pay | Admitting: Family Medicine

## 2019-03-20 NOTE — Telephone Encounter (Signed)
I would prefer she continue it for a tooth extraction unless the surgeon does not think it safe

## 2019-03-20 NOTE — Telephone Encounter (Signed)
Letter done and in IN box 

## 2019-03-20 NOTE — Telephone Encounter (Addendum)
[name] notified, [name] said that she thinks pt stayed on plavix a few years ago when she had to have another tooth extracted. [name] needs a letter from Dr. Glori Bickers stating that is okay for pt to stay on plavix for her upcoming tooth extraction. She is requesting we send the letter to Dr. Ceasar Mons at fax # [phone] att: Alyse Low or [facility]  Please write letter

## 2019-03-20 NOTE — Telephone Encounter (Signed)
Linda called.  Patient needs to have a tooth extracted by Dr.Todd Carman Ching.  [name] said Dr.Greeson's office wanted to know if it's okay for patient to go off of Plavix or continue Plavix before procedure. Please let [name] know recommendation.

## 2019-03-21 NOTE — Telephone Encounter (Signed)
Patient's niece called back in regards to paperwork they are needing for the dentist. She is following up on the paperwork to see if it has been sent since the patient has an appointment tomorrow at the dentist for the tooth extraction

## 2019-03-21 NOTE — Telephone Encounter (Signed)
Received conformed that fax was sent to Dr. Marilu Favre office.

## 2019-03-21 NOTE — Telephone Encounter (Signed)
I have printed & faxed letter to Dr.Greeson's office. I called to let patient's niece know that this was done.

## 2019-04-16 ENCOUNTER — Emergency Department (HOSPITAL_COMMUNITY)
Admission: EM | Admit: 2019-04-16 | Discharge: 2019-04-16 | Disposition: A | Discharge status: Home / Self Care | Payer: Medicare Other | Attending: Emergency Medicine | Admitting: Emergency Medicine

## 2019-04-16 ENCOUNTER — Encounter (HOSPITAL_COMMUNITY): Payer: Self-pay

## 2019-04-16 ENCOUNTER — Other Ambulatory Visit: Payer: Self-pay

## 2019-04-16 DIAGNOSIS — Z853 Personal history of malignant neoplasm of breast: Secondary | ICD-10-CM | POA: Insufficient documentation

## 2019-04-16 DIAGNOSIS — R040 Epistaxis: Secondary | ICD-10-CM | POA: Diagnosis not present

## 2019-04-16 DIAGNOSIS — Z7984 Long term (current) use of oral hypoglycemic drugs: Secondary | ICD-10-CM | POA: Diagnosis not present

## 2019-04-16 DIAGNOSIS — R58 Hemorrhage, not elsewhere classified: Secondary | ICD-10-CM | POA: Diagnosis not present

## 2019-04-16 DIAGNOSIS — N3000 Acute cystitis without hematuria: Secondary | ICD-10-CM

## 2019-04-16 DIAGNOSIS — Z7902 Long term (current) use of antithrombotics/antiplatelets: Secondary | ICD-10-CM | POA: Insufficient documentation

## 2019-04-16 DIAGNOSIS — I10 Essential (primary) hypertension: Secondary | ICD-10-CM | POA: Diagnosis not present

## 2019-04-16 DIAGNOSIS — N3090 Cystitis, unspecified without hematuria: Secondary | ICD-10-CM | POA: Insufficient documentation

## 2019-04-16 DIAGNOSIS — R0902 Hypoxemia: Secondary | ICD-10-CM | POA: Diagnosis not present

## 2019-04-16 DIAGNOSIS — R030 Elevated blood-pressure reading, without diagnosis of hypertension: Secondary | ICD-10-CM | POA: Diagnosis not present

## 2019-04-16 DIAGNOSIS — E119 Type 2 diabetes mellitus without complications: Secondary | ICD-10-CM | POA: Insufficient documentation

## 2019-04-16 DIAGNOSIS — Z79899 Other long term (current) drug therapy: Secondary | ICD-10-CM | POA: Diagnosis not present

## 2019-04-16 DIAGNOSIS — E039 Hypothyroidism, unspecified: Secondary | ICD-10-CM | POA: Insufficient documentation

## 2019-04-16 DIAGNOSIS — Z8673 Personal history of transient ischemic attack (TIA), and cerebral infarction without residual deficits: Secondary | ICD-10-CM | POA: Diagnosis not present

## 2019-04-16 LAB — URINALYSIS, ROUTINE W REFLEX MICROSCOPIC
Bilirubin Urine: NEGATIVE
Glucose, UA: NEGATIVE mg/dL
Hgb urine dipstick: NEGATIVE
Ketones, ur: NEGATIVE mg/dL
Nitrite: POSITIVE — AB
Protein, ur: NEGATIVE mg/dL
Specific Gravity, Urine: 1.011 (ref 1.005–1.030)
pH: 7 (ref 5.0–8.0)

## 2019-04-16 LAB — BASIC METABOLIC PANEL
Anion gap: 9 (ref 5–15)
BUN: 20 mg/dL (ref 8–23)
CO2: 25 mmol/L (ref 22–32)
Calcium: 9.2 mg/dL (ref 8.9–10.3)
Chloride: 105 mmol/L (ref 98–111)
Creatinine, Ser: 0.65 mg/dL (ref 0.44–1.00)
GFR calc Af Amer: >60 mL/min (ref >60)
GFR calc non Af Amer: >60 mL/min (ref >60)
Glucose, Bld: 120 mg/dL — ABNORMAL HIGH (ref 70–99)
Potassium: 4 mmol/L (ref 3.5–5.1)
Sodium: 139 mmol/L (ref 135–145)

## 2019-04-16 LAB — CBC WITH DIFFERENTIAL/PLATELET
Abs Immature Granulocytes: 0.08 10*3/uL — ABNORMAL HIGH (ref 0.00–0.07)
Basophils Absolute: 0.2 10*3/uL — ABNORMAL HIGH (ref 0.0–0.1)
Basophils Relative: 1 %
Eosinophils Absolute: 0.3 10*3/uL (ref 0.0–0.5)
Eosinophils Relative: 3 %
HCT: 41.2 % (ref 36.0–46.0)
Hemoglobin: 13.1 g/dL (ref 12.0–15.0)
Immature Granulocytes: 1 %
Lymphocytes Relative: 13 %
Lymphs Abs: 1.7 10*3/uL (ref 0.7–4.0)
MCH: 30.5 pg (ref 26.0–34.0)
MCHC: 31.8 g/dL (ref 30.0–36.0)
MCV: 95.8 fL (ref 80.0–100.0)
Monocytes Absolute: 1.1 10*3/uL — ABNORMAL HIGH (ref 0.1–1.0)
Monocytes Relative: 9 %
Neutro Abs: 9.5 10*3/uL — ABNORMAL HIGH (ref 1.7–7.7)
Neutrophils Relative %: 73 %
Platelets: 232 10*3/uL (ref 150–400)
RBC: 4.3 MIL/uL (ref 3.87–5.11)
RDW: 14 % (ref 11.5–15.5)
WBC: 12.8 10*3/uL — ABNORMAL HIGH (ref 4.0–10.5)
nRBC: 0 % (ref 0.0–0.2)

## 2019-04-16 MED ORDER — CEPHALEXIN 500 MG PO CAPS: 500.0000 mg | ORAL_CAPSULE | Freq: Four times a day (QID) | ORAL | 0 refills | Status: DC

## 2019-04-16 MED ORDER — CEPHALEXIN 500 MG PO CAPS
500.0000 mg | ORAL_CAPSULE | Freq: Once | ORAL | Status: AC
  Administered 2019-04-16: 500 mg via ORAL
  Filled 2019-04-16: qty 1

## 2019-04-16 NOTE — ED Provider Notes (Addendum)
[facility] DEPT Provider Note   CSN: [id number] Arrival date & time: 04/16/19  [id number]     History   Chief Complaint No chief complaint on file.   HPI Lauren [name] is a 85 y.o. female.     Patient brought in by [facility] from nursing facility.  Patient with acute onset of significant nosebleed.  With clots.  Bleeding mostly from the left nares area.  Triage mentions hypotension but I think the concern was for hypertension.  Patient states that her blood pressure was high.  No syncope no loss of consciousness.  Patient's past medical history significant for diabetes hyperlipidemia history of TIAs.  Patient without any specific complaints other than the nosebleed here today.  No chest pain no shortness of breath no fevers.  Family relates that patient has had frequent history of urinary tract infections and is like to have her urine checked.  In addition family states that she has had trouble with nosebleeds before but not quite this extensive.     Past Medical History:  Diagnosis Date  . Breast cancer ([place])    left breast  . Chronic constipation   . Chronic headaches   . Diabetes mellitus   . Fibrocystic breast   . Gastritis   . Gout    injection in the past  . Hyperlipidemia   . Melanoma in situ of left upper extremity ([place])   . Pelvic fracture ([place])   . TIA (transient ischemic attack) 08/2008   neg MRI/MRA and CT and carotid dopplers  . Venous insufficiency     Patient Active Problem List   Diagnosis Date Noted  . Exposure to COVID-19 virus 12/26/2018  . Fall at home, initial encounter 12/16/2018  . Exposure to communicable disease 12/16/2018  . Diabetic neuropathy ([place]) 11/22/2018  . Weight loss 08/01/2018  . Bloating 11/30/2017  . Hemiparesis due to recent stroke ([place]) 09/07/2017  . Hypothyroid 03/04/2017  . Diastolic dysfunction [id number]  . Urinary frequency 09/17/2016  . H/O: CVA (cerebrovascular accident) 08/15/2016  .  HTN (hypertension) 08/15/2016  . GERD (gastroesophageal reflux disease) 08/06/2015  . Hearing loss 08/06/2015  . Estrogen deficiency 05/01/2014  . Left knee pain 09/22/2013  . Encounter for Medicare annual wellness exam 04/04/2013  . Encounter for examination for admission to assisted living facility 04/04/2013  . History of melanoma excision 09/27/2012  . History of breast cancer, left 01/08/2011  . Chronic abdominal pain 09/17/2010  . History of pelvic fracture 09/17/2010  . DEGENERATIVE JOINT DISEASE 02/13/2010  . Migraine variant 11/06/2009  . Osteopenia 10/18/2008  . Transient ischemic attack 09/10/2008  . DM type 2 with diabetic peripheral neuropathy ([place]) 10/06/2007  . VENOUS INSUFFICIENCY 10/06/2007  . Diverticulosis of colon 10/06/2007  . FIBROCYSTIC BREAST DISEASE 10/06/2007  . Hyperlipidemia associated with type 2 diabetes mellitus ([place]) 11/29/2006    Past Surgical History:  Procedure Laterality Date  . APPENDECTOMY    . ARM SKIN LESION BIOPSY / EXCISION Right 11/02/2016  . BREAST BIOPSY Left 10/18/2007  . BREAST CYST ASPIRATION    . BREAST EXCISIONAL BIOPSY Right    x2  . BREAST LUMPECTOMY  09/2007   with sentinel LN biopsy  . BREAST LUMPECTOMY     left  . EYE SURGERY     retinal, then cataract     OB History   No obstetric history on file.      Home Medications    Prior to Admission medications   Medication Sig  Start Date End Date Taking? Authorizing Provider  acetaminophen (TYLENOL) 325 MG tablet Take 2 tablets (650 mg total) by mouth every 6 (six) hours as needed for mild pain (or Fever >/= 101). 03/02/16  Yes Eber Jones, MD  alum & mag hydroxide-simeth (MAALOX/MYLANTA) 200-200-20 MG/5ML suspension Take 30 mLs by mouth every 6 (six) hours as needed (gas, abdominal pain).   Yes [provider]  Blood Glucose Monitoring Suppl (FREESTYLE LITE) DEVI USE AS DIRECTED TO CHECK BLOOD SUGAR ONCE A DAY AND AS NEEDED (DX. E11.9) 06/16/18  Yes  Tower, Wynelle Fanny, MD  Cholecalciferol (VITAMIN D3) 1.25 MG (50000 UT) CAPS Take 50,000 Units by mouth every 30 (thirty) days.   Yes [provider]  citalopram (CELEXA) 10 MG tablet Take 0.5 tablets (5 mg total) by mouth daily. 11/22/18  Yes Tower, Wynelle Fanny, MD  clopidogrel (PLAVIX) 75 MG tablet TAKE ONE TABLET BY MOUTH EVERY MORNING WITH BREAKFAST. Patient taking differently: Take 75 mg by mouth daily.  11/22/18  Yes Tower, Wynelle Fanny, MD  dicyclomine (BENTYL) 10 MG capsule Take 10 mg by mouth 2 (two) times daily.   Yes [provider]  fluticasone (FLONASE) 50 MCG/ACT nasal spray Place 1 spray into both nostrils daily.    Yes [provider]  glucose blood (FREESTYLE LITE) test strip USE AS DIRECTED TO CHECK BLOOD SUGAR ONCE A DAY AND AS NEEDED (DX. E11.9) 06/16/18  Yes Tower, Wynelle Fanny, MD  Lancets (FREESTYLE) lancets USE AS DIRECTED TO CHECK BLOOD SUGAR ONCE A DAY AND AS NEEDED (DX. E11.9) 06/16/18  Yes Tower, Wynelle Fanny, MD  levothyroxine (SYNTHROID) 50 MCG tablet Take 1 tablet (50 mcg total) by mouth daily before breakfast. 30 minutes before other medicines or supplements 11/22/18  Yes Tower, Wynelle Fanny, MD  metFORMIN (GLUCOPHAGE) 500 MG tablet Take 0.5 tablets (250 mg total) by mouth 2 (two) times daily with a meal. 11/22/18  Yes Tower, Wynelle Fanny, MD  Multiple Vitamins-Minerals (PRESERVISION AREDS 2 PO) Take 1 tablet by mouth 2 (two) times daily.   Yes [provider]  pantoprazole (PROTONIX) 40 MG tablet Take 1 tablet (40 mg total) by mouth daily. 11/22/18  Yes Tower, Marne A, MD  polyethylene glycol powder (GLYCOLAX/MIRALAX) powder DISSOLVE ONE (1) CAPFUL (17GM) INTO 4 TO8 OUNCES OF FLUID AND TAKE BYMOUTH ONCE DAILY AS DIRECTED Patient taking differently: Take 17 g by mouth daily. DISSOLVE ONE (1) CAPFUL (17GM) INTO 4 TO8 OUNCES OF FLUID AND TAKE BYMOUTH ONCE DAILY AS DIRECTED 03/23/17  Yes Tower, Wynelle Fanny, MD  polyvinyl alcohol (LIQUIFILM TEARS) 1.4 % ophthalmic solution Place 1  drop into both eyes 2 (two) times daily.   Yes [provider]  Probiotic Product (ALIGN) 4 MG CAPS Take 4 mg by mouth daily.    Yes [provider]  sennosides-docusate sodium (SENOKOT-S) 8.6-50 MG tablet Take 1 tablet by mouth daily as needed for constipation.   Yes [provider]  simvastatin (ZOCOR) 20 MG tablet Take 1 tablet (20 mg total) by mouth at bedtime. 11/22/18  Yes Tower, Wynelle Fanny, MD  cephALEXin (KEFLEX) 500 MG capsule Take 1 capsule (500 mg total) by mouth 4 (four) times daily. 04/16/19   Fredia Sorrow, MD    Family History Family History  Problem Relation Age of Onset  . Breast cancer Sister   . Stroke Sister   . Cancer Sister        breast  . Breast cancer Sister   . Stroke Sister   .  Cancer Sister        breast  . Stroke Mother   . Stroke Father   . Stroke Brother   . Cancer Brother        prostate  . Stroke Brother   . Stroke Sister   . CAD Sister     Social History Social History   Tobacco Use  . Smoking status: Never Smoker  . Smokeless tobacco: Never Used  Substance Use Topics  . Alcohol use: No    Alcohol/week: 0.0 standard drinks  . Drug use: No     Allergies   Sulfonamide derivatives and Aspirin   Review of Systems Review of Systems  Constitutional: Negative for chills and fever.  HENT: Positive for nosebleeds. Negative for rhinorrhea and sore throat.   Eyes: Negative for visual disturbance.  Respiratory: Negative for cough and shortness of breath.   Cardiovascular: Negative for chest pain and leg swelling.  Gastrointestinal: Negative for abdominal pain, diarrhea, nausea and vomiting.  Genitourinary: Positive for frequency. Negative for dysuria.  Musculoskeletal: Negative for back pain and neck pain.  Skin: Negative for rash.  Neurological: Negative for dizziness, light-headedness and headaches.  Hematological: Does not bruise/bleed easily.  Psychiatric/Behavioral: Negative for confusion.     Physical  Exam Updated Vital Signs BP (!) 169/65   Pulse 86   Temp 98.2 F (36.8 C) (Oral)   Resp (!) 35   SpO2 95%   Physical Exam Vitals signs and nursing note reviewed.  Constitutional:      General: She is not in acute distress.    Appearance: Normal appearance. She is well-developed.  HENT:     Head: Normocephalic and atraumatic.     Nose:     Comments: Skin irritation to both nares.  Some fresh blood in the left nares.  No active bleeding right now.  No blood going down the back of the throat. Eyes:     Extraocular Movements: Extraocular movements intact.     Conjunctiva/sclera: Conjunctivae normal.     Pupils: Pupils are equal, round, and reactive to light.  Neck:     Musculoskeletal: Normal range of motion and neck supple.  Cardiovascular:     Rate and Rhythm: Normal rate and regular rhythm.     Heart sounds: No murmur.  Pulmonary:     Effort: Pulmonary effort is normal. No respiratory distress.     Breath sounds: Normal breath sounds.  Abdominal:     General: Bowel sounds are normal.     Palpations: Abdomen is soft.     Tenderness: There is no abdominal tenderness.  Musculoskeletal: Normal range of motion.        General: No swelling.  Skin:    General: Skin is warm and dry.  Neurological:     General: No focal deficit present.     Mental Status: She is alert. Mental status is at baseline.     Cranial Nerves: No cranial nerve deficit.     Sensory: No sensory deficit.      ED Treatments / Results  Labs (all labs ordered are listed, but only abnormal results are displayed) Labs Reviewed  CBC WITH DIFFERENTIAL/PLATELET - Abnormal; Notable for the following components:      Result Value   WBC 12.8 (*)    Neutro Abs 9.5 (*)    Monocytes Absolute 1.1 (*)    Basophils Absolute 0.2 (*)    Abs Immature Granulocytes 0.08 (*)    All other components within normal limits  BASIC METABOLIC PANEL - Abnormal; Notable for the following components:   Glucose, Bld 120 (*)     All other components within normal limits  URINALYSIS, ROUTINE W REFLEX MICROSCOPIC - Abnormal; Notable for the following components:   Nitrite POSITIVE (*)    Leukocytes,Ua SMALL (*)    Bacteria, UA FEW (*)    All other components within normal limits  URINE CULTURE    EKG None  Radiology No results found.  Procedures Procedures (including critical care time)  Medications Ordered in ED Medications  cephALEXin (KEFLEX) capsule 500 mg (has no administration in time range)     Initial Impression / Assessment and Plan / ED Course  I have reviewed the triage vital signs and the nursing notes.  Pertinent labs & imaging results that were available during my care of the patient were reviewed by me and considered in my medical decision making (see chart for details).        Nosebleed spontaneously corrected itself here.  No further bleeding after period of observation.  Patient's blood counts on CBC without any significant abnormalities.  Basic metabolic panel without significant abnormalities.  Patient's blood pressure has been elevated anywhere from [id number] systolic to [id number] systolic.  Suspect patient may have developed some hypertension.  But family member states they check her blood pressure daily at the nursing facility so we will have them continue to do that if blood pressures are trending up may need consultation for beginning treatment for hypertension.  Will not start antihypertensive meds just based on today's readings.  Patient a year ago had blood pressure readings that were essentially normal.  Had wanted neurology where the systolic was in the 0000000.  We will have patient follow-up with her ear nose and throat doctor in [place] regarding the nose.  There may be some need to consideration of stopping the Flonase.  Nursing facility will follow up on her blood pressures.    Urinalysis has been sent and is pending.   Urinalysis positive nitrite suggestive of urinary  tract infection.  Urine sent for culture.  Will give a dose of Keflex here and continue Keflex at home.   Final Clinical Impressions(s) / ED Diagnoses   Final diagnoses:  Epistaxis  Essential hypertension  Acute cystitis without hematuria    ED Discharge Orders         Ordered    cephALEXin (KEFLEX) 500 MG capsule  4 times daily     04/16/19 1405           Fredia Sorrow, MD 04/16/19 1303    Fredia Sorrow, MD 04/16/19 1406

## 2019-04-16 NOTE — ED Triage Notes (Signed)
Patient coming from nursing home with c/o nose bleed and hypotension. Per nursing facility staff patient nose is been bleeding for 1 hours.

## 2019-04-16 NOTE — Discharge Instructions (Addendum)
For the nosebleed recommend following up with your ear nose and throat doctor that she have in [place].  Is possible the Flonase could be thinning the skin in the nose and resulting in the increased risk of the nosebleed.  Labs here today are fine.  Blood pressure has been elevated here recommend that they check it daily and keep a log book and see what the trends are.  Is possible that you have developed some hypertension and may require some treatment for that.  But we cannot determine that on today's readings.  Urinalysis today highly suggestive of urinary tract infection.  Sent for culture.  Will start on the antibiotic Keflex first dose given here.  Prescription provided to come complete a 7-day course of Keflex.

## 2019-04-17 DIAGNOSIS — B351 Tinea unguium: Secondary | ICD-10-CM | POA: Diagnosis not present

## 2019-04-17 DIAGNOSIS — R2689 Other abnormalities of gait and mobility: Secondary | ICD-10-CM | POA: Diagnosis not present

## 2019-04-17 DIAGNOSIS — L603 Nail dystrophy: Secondary | ICD-10-CM | POA: Diagnosis not present

## 2019-04-17 DIAGNOSIS — E1140 Type 2 diabetes mellitus with diabetic neuropathy, unspecified: Secondary | ICD-10-CM | POA: Diagnosis not present

## 2019-04-17 DIAGNOSIS — L851 Acquired keratosis [keratoderma] palmaris et plantaris: Secondary | ICD-10-CM | POA: Diagnosis not present

## 2019-04-17 DIAGNOSIS — M79673 Pain in unspecified foot: Secondary | ICD-10-CM | POA: Diagnosis not present

## 2019-04-19 LAB — URINE CULTURE: Culture: >=100000 — AB

## 2019-04-20 ENCOUNTER — Telehealth: Payer: Self-pay | Admitting: Emergency Medicine

## 2019-04-20 NOTE — Progress Notes (Signed)
ED Antimicrobial Stewardship Positive Culture Follow Up   Lauren [name] is an 85 y.o. female who presented to [facility] on 04/16/2019 with a chief complaint of No chief complaint on file.   Recent Results (from the past 720 hour(s))  Urine culture     Status: Abnormal   Collection Time: 04/16/19  2:05 PM   Specimen: Urine, Clean Catch  Result Value Ref Range Status   Specimen Description   Final    URINE, CLEAN CATCH Performed at [facility], [place] [address] [place], [place] [address]    Special Requests   Final    NONE Performed at [facility], [place] [address] [place],  [address]    Culture >=100,000 COLONIES/mL ESCHERICHIA COLI (A)  Final   Report Status 04/19/2019 FINAL  Final   Organism ID, Bacteria ESCHERICHIA COLI (A)  Final      Susceptibility   Escherichia coli - MIC*    AMPICILLIN >=32 RESISTANT Resistant     CEFAZOLIN >=64 RESISTANT Resistant     CEFTRIAXONE <=1 SENSITIVE Sensitive     CIPROFLOXACIN <=0.25 SENSITIVE Sensitive     GENTAMICIN <=1 SENSITIVE Sensitive     IMIPENEM <=0.25 SENSITIVE Sensitive     NITROFURANTOIN <=16 SENSITIVE Sensitive     TRIMETH/SULFA <=20 SENSITIVE Sensitive     AMPICILLIN/SULBACTAM >=32 RESISTANT Resistant     PIP/TAZO 8 SENSITIVE Sensitive     Extended ESBL NEGATIVE Sensitive     * >=100,000 COLONIES/mL ESCHERICHIA COLI    [x]  Treated with Keflex, organism resistant to prescribed antimicrobial. However, patient does not appear to have ever had symptomatic UTI, despite positive UA.  Plan  Stop Keflex  Will call patient and check for any new UTI symptoms - if present, treat with Macrobid 100 mg PO bid x 5 days (#10)  If no UTI symptoms, no further treatment warranted   ED Provider: Lajean Saver, MD   [facility], Lauren [name] A 04/20/2019, 11:49 AM Clinical Pharmacist [phone]

## 2019-04-20 NOTE — Telephone Encounter (Signed)
Post ED Visit - Positive Culture Follow-up: Successful Patient Follow-Up  Culture assessed and recommendations reviewed by:  []  Elenor Quinones, Pharm.D. []  Heide Guile, Pharm.D., BCPS AQ-ID []  Parks Neptune, Pharm.D., BCPS []  Alycia Rossetti, Pharm.D., BCPS []  [place], Pharm.D., BCPS, AAHIVP []  Legrand Como, Pharm.D., BCPS, AAHIVP []  Salome Arnt, PharmD, BCPS []  Johnnette Gourd, PharmD, BCPS []  Hughes Better, PharmD, BCPS [x]  Reuel Boom, PharmD  Positive urine culture  []  Patient discharged without antimicrobial prescription and treatment is now indicated [x]  Organism is resistant to prescribed ED discharge antimicrobial []  Patient with positive blood cultures  Changes discussed with ED provider: Lajean Saver, MD New antibiotic prescription: IF symtomatic Macrobid one BID x five days Called to: result with order faxed to [facility] [phone]  Contacted patient's nurse Lattie Haw @ [facility], date 04/20/2019, time [address] 04/20/2019, 2:45 PM

## 2019-05-08 ENCOUNTER — Other Ambulatory Visit: Payer: Self-pay

## 2019-05-08 ENCOUNTER — Encounter (INDEPENDENT_AMBULATORY_CARE_PROVIDER_SITE_OTHER): Payer: Medicare Other | Admitting: Ophthalmology

## 2019-05-08 DIAGNOSIS — E113311 Type 2 diabetes mellitus with moderate nonproliferative diabetic retinopathy with macular edema, right eye: Secondary | ICD-10-CM | POA: Diagnosis not present

## 2019-05-08 DIAGNOSIS — E11311 Type 2 diabetes mellitus with unspecified diabetic retinopathy with macular edema: Secondary | ICD-10-CM | POA: Diagnosis not present

## 2019-05-08 DIAGNOSIS — H353132 Nonexudative age-related macular degeneration, bilateral, intermediate dry stage: Secondary | ICD-10-CM

## 2019-05-08 DIAGNOSIS — H43813 Vitreous degeneration, bilateral: Secondary | ICD-10-CM

## 2019-05-08 DIAGNOSIS — E113392 Type 2 diabetes mellitus with moderate nonproliferative diabetic retinopathy without macular edema, left eye: Secondary | ICD-10-CM | POA: Diagnosis not present

## 2019-05-22 ENCOUNTER — Telehealth: Payer: Self-pay | Admitting: Family Medicine

## 2019-05-22 NOTE — Telephone Encounter (Signed)
Orders done and in IN box

## 2019-05-22 NOTE — Telephone Encounter (Signed)
Patient's Niece [name] called today in regards to the patient's lab appointment that was scheduled for the 29th. She cancelled the appointment due to the patient being in the nursing home, she stated that they can do her lab work there but she needed to verify what all you are needing so she can let them know to make sure they get the correct blood work done  [name] is requesting a call back  [phone]

## 2019-05-22 NOTE — Telephone Encounter (Signed)
[name] notified order done and I faxed it to [facility] fax # [phone]

## 2019-05-23 ENCOUNTER — Other Ambulatory Visit: Payer: Medicare Other

## 2019-05-25 NOTE — Telephone Encounter (Signed)
I'm fine for her to cancel with [place] and see the provider there- for safety

## 2019-05-25 NOTE — Telephone Encounter (Signed)
Lauren [name] cancelled appt, insurance will not pay for 2 providers and since pt is in nursing home they will cover the provider there and not Dr. Glori Bickers. Pt will see PCP at facility going forward   FYI to Dr. Glori Bickers

## 2019-05-25 NOTE — Telephone Encounter (Signed)
Understood, thanks I think that is the easiest and safest route

## 2019-05-25 NOTE — Telephone Encounter (Signed)
[name] called back about patient's upcoming appointment She stated the patient had the lab work done at the nursing facility and she does have a provider who is seeing her there. She wanted to know if the appointment for the 5th in our office should be cancelled. Or if she needs to see you for the follow up to discuss the labs. She was not sure if they sent the lab results to you

## 2019-05-30 ENCOUNTER — Ambulatory Visit: Payer: Medicare Other | Admitting: Family Medicine

## 2019-06-06 DIAGNOSIS — Z23 Encounter for immunization: Secondary | ICD-10-CM | POA: Diagnosis not present

## 2019-06-21 DIAGNOSIS — E119 Type 2 diabetes mellitus without complications: Secondary | ICD-10-CM | POA: Diagnosis not present

## 2019-06-22 DIAGNOSIS — E039 Hypothyroidism, unspecified: Secondary | ICD-10-CM | POA: Diagnosis not present

## 2019-06-22 DIAGNOSIS — D649 Anemia, unspecified: Secondary | ICD-10-CM | POA: Diagnosis not present

## 2019-06-22 DIAGNOSIS — R6889 Other general symptoms and signs: Secondary | ICD-10-CM | POA: Diagnosis not present

## 2019-06-27 DIAGNOSIS — B351 Tinea unguium: Secondary | ICD-10-CM | POA: Diagnosis not present

## 2019-06-27 DIAGNOSIS — R2689 Other abnormalities of gait and mobility: Secondary | ICD-10-CM | POA: Diagnosis not present

## 2019-06-27 DIAGNOSIS — E1140 Type 2 diabetes mellitus with diabetic neuropathy, unspecified: Secondary | ICD-10-CM | POA: Diagnosis not present

## 2019-06-27 DIAGNOSIS — M79673 Pain in unspecified foot: Secondary | ICD-10-CM | POA: Diagnosis not present

## 2019-06-27 DIAGNOSIS — L851 Acquired keratosis [keratoderma] palmaris et plantaris: Secondary | ICD-10-CM | POA: Diagnosis not present

## 2019-06-27 DIAGNOSIS — L603 Nail dystrophy: Secondary | ICD-10-CM | POA: Diagnosis not present

## 2019-07-04 DIAGNOSIS — Z23 Encounter for immunization: Secondary | ICD-10-CM | POA: Diagnosis not present

## 2019-07-12 DIAGNOSIS — D225 Melanocytic nevi of trunk: Secondary | ICD-10-CM | POA: Diagnosis not present

## 2019-07-12 DIAGNOSIS — L570 Actinic keratosis: Secondary | ICD-10-CM | POA: Diagnosis not present

## 2019-07-12 DIAGNOSIS — L918 Other hypertrophic disorders of the skin: Secondary | ICD-10-CM | POA: Diagnosis not present

## 2019-07-12 DIAGNOSIS — L820 Inflamed seborrheic keratosis: Secondary | ICD-10-CM | POA: Diagnosis not present

## 2019-07-12 DIAGNOSIS — L814 Other melanin hyperpigmentation: Secondary | ICD-10-CM | POA: Diagnosis not present

## 2019-07-12 DIAGNOSIS — L304 Erythema intertrigo: Secondary | ICD-10-CM | POA: Diagnosis not present

## 2019-07-12 DIAGNOSIS — D1801 Hemangioma of skin and subcutaneous tissue: Secondary | ICD-10-CM | POA: Diagnosis not present

## 2019-07-12 DIAGNOSIS — Z85828 Personal history of other malignant neoplasm of skin: Secondary | ICD-10-CM | POA: Diagnosis not present

## 2019-07-12 DIAGNOSIS — L821 Other seborrheic keratosis: Secondary | ICD-10-CM | POA: Diagnosis not present

## 2019-07-31 ENCOUNTER — Encounter (INDEPENDENT_AMBULATORY_CARE_PROVIDER_SITE_OTHER): Payer: Medicare Other | Admitting: Ophthalmology

## 2019-07-31 DIAGNOSIS — H43813 Vitreous degeneration, bilateral: Secondary | ICD-10-CM | POA: Diagnosis not present

## 2019-07-31 DIAGNOSIS — H3509 Other intraretinal microvascular abnormalities: Secondary | ICD-10-CM | POA: Diagnosis not present

## 2019-07-31 DIAGNOSIS — E113292 Type 2 diabetes mellitus with mild nonproliferative diabetic retinopathy without macular edema, left eye: Secondary | ICD-10-CM | POA: Diagnosis not present

## 2019-07-31 DIAGNOSIS — E113311 Type 2 diabetes mellitus with moderate nonproliferative diabetic retinopathy with macular edema, right eye: Secondary | ICD-10-CM

## 2019-07-31 DIAGNOSIS — E11311 Type 2 diabetes mellitus with unspecified diabetic retinopathy with macular edema: Secondary | ICD-10-CM | POA: Diagnosis not present

## 2019-08-28 DIAGNOSIS — M79673 Pain in unspecified foot: Secondary | ICD-10-CM | POA: Diagnosis not present

## 2019-08-28 DIAGNOSIS — I509 Heart failure, unspecified: Secondary | ICD-10-CM | POA: Diagnosis not present

## 2019-08-28 DIAGNOSIS — L84 Corns and callosities: Secondary | ICD-10-CM | POA: Diagnosis not present

## 2019-08-28 DIAGNOSIS — R2689 Other abnormalities of gait and mobility: Secondary | ICD-10-CM | POA: Diagnosis not present

## 2019-08-28 DIAGNOSIS — L603 Nail dystrophy: Secondary | ICD-10-CM | POA: Diagnosis not present

## 2019-08-28 DIAGNOSIS — B351 Tinea unguium: Secondary | ICD-10-CM | POA: Diagnosis not present

## 2019-10-20 ENCOUNTER — Telehealth: Payer: Self-pay | Admitting: Family Medicine

## 2019-10-20 NOTE — Progress Notes (Signed)
  Chronic Care Management   Note  10/20/2019 Name: AVENA HILLIER MRN: [id number] DOB: 05-May-1928  STORM PIETRAS is a 86 y.o. year old female who is a primary care patient of Tower, Wynelle Fanny, MD. I reached out to Jeni Salles by phone today in response to a referral sent by Ms. Coralee North PCP, Tower, Wynelle Fanny, MD.   Ms. Kravets was given information about Chronic Care Management services today including:  1. CCM service includes personalized support from designated clinical staff supervised by her physician, including individualized plan of care and coordination with other care providers 2. 24/7 contact phone numbers for assistance for urgent and routine care needs. 3. Service will only be billed when office clinical staff spend 20 minutes or more in a month to coordinate care. 4. Only one practitioner may furnish and bill the service in a calendar month. 5. The patient may stop CCM services at any time (effective at the end of the month) by phone call to the office staff.   Patient wishes to consider information provided and/or speak with a member of the care team before deciding about enrollment in care management services.   This note is not being shared with the patient for the following reason: To respect privacy (The patient or proxy has requested that the information not be shared).  Follow up plan:   Earney [place] [facility] Scheduler

## 2019-10-27 NOTE — Telephone Encounter (Signed)
Patient's niece [name] called back in regards to call they received.  She was given the schedulers number to call back to discuss scheduling. She stated she would call now .

## 2019-10-30 ENCOUNTER — Encounter (INDEPENDENT_AMBULATORY_CARE_PROVIDER_SITE_OTHER): Payer: Medicare Other | Admitting: Ophthalmology

## 2019-10-30 ENCOUNTER — Other Ambulatory Visit: Payer: Self-pay

## 2019-10-30 DIAGNOSIS — H43813 Vitreous degeneration, bilateral: Secondary | ICD-10-CM | POA: Diagnosis not present

## 2019-10-30 DIAGNOSIS — M79673 Pain in unspecified foot: Secondary | ICD-10-CM | POA: Diagnosis not present

## 2019-10-30 DIAGNOSIS — E113292 Type 2 diabetes mellitus with mild nonproliferative diabetic retinopathy without macular edema, left eye: Secondary | ICD-10-CM

## 2019-10-30 DIAGNOSIS — I10 Essential (primary) hypertension: Secondary | ICD-10-CM

## 2019-10-30 DIAGNOSIS — E11311 Type 2 diabetes mellitus with unspecified diabetic retinopathy with macular edema: Secondary | ICD-10-CM

## 2019-10-30 DIAGNOSIS — E113311 Type 2 diabetes mellitus with moderate nonproliferative diabetic retinopathy with macular edema, right eye: Secondary | ICD-10-CM | POA: Diagnosis not present

## 2019-10-30 DIAGNOSIS — H35033 Hypertensive retinopathy, bilateral: Secondary | ICD-10-CM | POA: Diagnosis not present

## 2019-10-30 DIAGNOSIS — E1140 Type 2 diabetes mellitus with diabetic neuropathy, unspecified: Secondary | ICD-10-CM | POA: Diagnosis not present

## 2019-10-30 DIAGNOSIS — B351 Tinea unguium: Secondary | ICD-10-CM | POA: Diagnosis not present

## 2019-10-30 DIAGNOSIS — H353132 Nonexudative age-related macular degeneration, bilateral, intermediate dry stage: Secondary | ICD-10-CM

## 2019-10-30 DIAGNOSIS — L605 Yellow nail syndrome: Secondary | ICD-10-CM | POA: Diagnosis not present

## 2019-10-30 DIAGNOSIS — R2681 Unsteadiness on feet: Secondary | ICD-10-CM | POA: Diagnosis not present

## 2019-12-22 DIAGNOSIS — Z85828 Personal history of other malignant neoplasm of skin: Secondary | ICD-10-CM | POA: Diagnosis not present

## 2019-12-22 DIAGNOSIS — L239 Allergic contact dermatitis, unspecified cause: Secondary | ICD-10-CM | POA: Diagnosis not present

## 2020-01-01 DIAGNOSIS — R2681 Unsteadiness on feet: Secondary | ICD-10-CM | POA: Diagnosis not present

## 2020-01-01 DIAGNOSIS — B351 Tinea unguium: Secondary | ICD-10-CM | POA: Diagnosis not present

## 2020-01-01 DIAGNOSIS — L603 Nail dystrophy: Secondary | ICD-10-CM | POA: Diagnosis not present

## 2020-01-01 DIAGNOSIS — I509 Heart failure, unspecified: Secondary | ICD-10-CM | POA: Diagnosis not present

## 2020-01-01 DIAGNOSIS — M79673 Pain in unspecified foot: Secondary | ICD-10-CM | POA: Diagnosis not present

## 2020-02-05 ENCOUNTER — Other Ambulatory Visit: Payer: Self-pay

## 2020-02-05 ENCOUNTER — Encounter (INDEPENDENT_AMBULATORY_CARE_PROVIDER_SITE_OTHER): Payer: Medicare Other | Admitting: Ophthalmology

## 2020-02-05 DIAGNOSIS — H353132 Nonexudative age-related macular degeneration, bilateral, intermediate dry stage: Secondary | ICD-10-CM | POA: Diagnosis not present

## 2020-02-05 DIAGNOSIS — H35033 Hypertensive retinopathy, bilateral: Secondary | ICD-10-CM

## 2020-02-05 DIAGNOSIS — I10 Essential (primary) hypertension: Secondary | ICD-10-CM | POA: Diagnosis not present

## 2020-02-05 DIAGNOSIS — H43813 Vitreous degeneration, bilateral: Secondary | ICD-10-CM | POA: Diagnosis not present

## 2020-02-05 DIAGNOSIS — E113393 Type 2 diabetes mellitus with moderate nonproliferative diabetic retinopathy without macular edema, bilateral: Secondary | ICD-10-CM

## 2020-02-05 DIAGNOSIS — E11319 Type 2 diabetes mellitus with unspecified diabetic retinopathy without macular edema: Secondary | ICD-10-CM

## 2020-03-04 DIAGNOSIS — B351 Tinea unguium: Secondary | ICD-10-CM | POA: Diagnosis not present

## 2020-03-04 DIAGNOSIS — R2681 Unsteadiness on feet: Secondary | ICD-10-CM | POA: Diagnosis not present

## 2020-03-04 DIAGNOSIS — M79673 Pain in unspecified foot: Secondary | ICD-10-CM | POA: Diagnosis not present

## 2020-03-04 DIAGNOSIS — L605 Yellow nail syndrome: Secondary | ICD-10-CM | POA: Diagnosis not present

## 2020-03-04 DIAGNOSIS — E1140 Type 2 diabetes mellitus with diabetic neuropathy, unspecified: Secondary | ICD-10-CM | POA: Diagnosis not present

## 2020-03-11 DIAGNOSIS — E119 Type 2 diabetes mellitus without complications: Secondary | ICD-10-CM | POA: Diagnosis not present

## 2020-03-11 DIAGNOSIS — R7989 Other specified abnormal findings of blood chemistry: Secondary | ICD-10-CM | POA: Diagnosis not present

## 2020-03-11 DIAGNOSIS — D649 Anemia, unspecified: Secondary | ICD-10-CM | POA: Diagnosis not present

## 2020-03-11 DIAGNOSIS — D519 Vitamin B12 deficiency anemia, unspecified: Secondary | ICD-10-CM | POA: Diagnosis not present

## 2020-03-11 DIAGNOSIS — R300 Dysuria: Secondary | ICD-10-CM | POA: Diagnosis not present

## 2020-03-13 DIAGNOSIS — E785 Hyperlipidemia, unspecified: Secondary | ICD-10-CM | POA: Diagnosis not present

## 2020-03-13 DIAGNOSIS — E1151 Type 2 diabetes mellitus with diabetic peripheral angiopathy without gangrene: Secondary | ICD-10-CM | POA: Diagnosis not present

## 2020-03-28 DIAGNOSIS — Z23 Encounter for immunization: Secondary | ICD-10-CM | POA: Diagnosis not present

## 2020-07-08 ENCOUNTER — Other Ambulatory Visit: Payer: Self-pay

## 2020-07-08 ENCOUNTER — Encounter (INDEPENDENT_AMBULATORY_CARE_PROVIDER_SITE_OTHER): Payer: Medicare Other | Admitting: Ophthalmology

## 2020-07-08 DIAGNOSIS — E113393 Type 2 diabetes mellitus with moderate nonproliferative diabetic retinopathy without macular edema, bilateral: Secondary | ICD-10-CM | POA: Diagnosis not present

## 2020-07-08 DIAGNOSIS — I10 Essential (primary) hypertension: Secondary | ICD-10-CM

## 2020-07-08 DIAGNOSIS — H43812 Vitreous degeneration, left eye: Secondary | ICD-10-CM

## 2020-07-08 DIAGNOSIS — H353132 Nonexudative age-related macular degeneration, bilateral, intermediate dry stage: Secondary | ICD-10-CM | POA: Diagnosis not present

## 2020-07-08 DIAGNOSIS — H35033 Hypertensive retinopathy, bilateral: Secondary | ICD-10-CM | POA: Diagnosis not present

## 2020-10-01 ENCOUNTER — Emergency Department
Admission: EM | Admit: 2020-10-01 | Discharge: 2020-10-01 | Disposition: A | Discharge status: Home / Self Care | Payer: Medicare Other | Attending: Emergency Medicine | Admitting: Emergency Medicine

## 2020-10-01 DIAGNOSIS — Z794 Long term (current) use of insulin: Secondary | ICD-10-CM | POA: Insufficient documentation

## 2020-10-01 DIAGNOSIS — Z85828 Personal history of other malignant neoplasm of skin: Secondary | ICD-10-CM | POA: Insufficient documentation

## 2020-10-01 DIAGNOSIS — Z8616 Personal history of COVID-19: Secondary | ICD-10-CM | POA: Insufficient documentation

## 2020-10-01 DIAGNOSIS — Z7902 Long term (current) use of antithrombotics/antiplatelets: Secondary | ICD-10-CM | POA: Diagnosis not present

## 2020-10-01 DIAGNOSIS — Z7984 Long term (current) use of oral hypoglycemic drugs: Secondary | ICD-10-CM | POA: Insufficient documentation

## 2020-10-01 DIAGNOSIS — E1169 Type 2 diabetes mellitus with other specified complication: Secondary | ICD-10-CM | POA: Diagnosis not present

## 2020-10-01 DIAGNOSIS — E1142 Type 2 diabetes mellitus with diabetic polyneuropathy: Secondary | ICD-10-CM | POA: Diagnosis not present

## 2020-10-01 DIAGNOSIS — E785 Hyperlipidemia, unspecified: Secondary | ICD-10-CM | POA: Insufficient documentation

## 2020-10-01 DIAGNOSIS — Z853 Personal history of malignant neoplasm of breast: Secondary | ICD-10-CM | POA: Diagnosis not present

## 2020-10-01 DIAGNOSIS — I10 Essential (primary) hypertension: Secondary | ICD-10-CM | POA: Insufficient documentation

## 2020-10-01 DIAGNOSIS — R040 Epistaxis: Secondary | ICD-10-CM | POA: Diagnosis present

## 2020-10-01 MED ORDER — LIDOCAINE VISCOUS HCL 2 % MT SOLN
15.0000 mL | Freq: Once | OROMUCOSAL | Status: AC
  Administered 2020-10-01: 15 mL via OROMUCOSAL
  Filled 2020-10-01: qty 15

## 2020-10-01 MED ORDER — OXYMETAZOLINE HCL 0.05 % NA SOLN
1.0000 | Freq: Once | NASAL | Status: AC
  Administered 2020-10-01: 1 via NASAL
  Filled 2020-10-01: qty 30

## 2020-10-01 MED ORDER — CEPHALEXIN 250 MG PO CAPS: 250.0000 mg | ORAL_CAPSULE | Freq: Two times a day (BID) | ORAL | 0 refills | Status: DC

## 2020-10-01 MED ORDER — CEPHALEXIN 250 MG PO CAPS: 250.0000 mg | ORAL_CAPSULE | Freq: Two times a day (BID) | ORAL | 0 refills | Status: AC

## 2020-10-01 NOTE — ED Triage Notes (Signed)
Pt to ED via EMS from [facility] for epistaxis, started around 1300 today. Pt of plavix. Pt arrives with noseclip on. Pt denies known injury, states "this happens a lot."

## 2020-10-01 NOTE — ED Notes (Signed)
Pt assisted onto bedside commode at pt's request and assist back into bed without incident

## 2020-10-01 NOTE — ED Notes (Signed)
ENT cart at bedside, medication given to Dr. Corky Downs to administer during nasal packing.

## 2020-10-01 NOTE — ED Provider Notes (Signed)
[facility] Emergency Department Provider Note   ____________________________________________    I have reviewed the triage vital signs and the nursing notes.   HISTORY  Chief Complaint Epistaxis     HPI Lauren [name] is a 87 y.o. female brought in by EMS for evaluation of a left-sided nosebleed.  Apparently this has been bleeding for multiple hours off and on.  Patient denies trauma.  No reports of injury.  Daughter reports this happens about once a year.  Is on Plavix, not on other blood thinners  Past Medical History:  Diagnosis Date  . Breast cancer ([place])    left breast  . Chronic constipation   . Chronic headaches   . Diabetes mellitus   . Fibrocystic breast   . Gastritis   . Gout    injection in the past  . Hyperlipidemia   . Melanoma in situ of left upper extremity ([place])   . Pelvic fracture ([place])   . TIA (transient ischemic attack) 08/2008   neg MRI/MRA and CT and carotid dopplers  . Venous insufficiency     Patient Active Problem List   Diagnosis Date Noted  . Exposure to COVID-19 virus 12/26/2018  . Fall at home, initial encounter 12/16/2018  . Exposure to communicable disease 12/16/2018  . Diabetic neuropathy ([place]) 11/22/2018  . Weight loss 08/01/2018  . Bloating 11/30/2017  . Hemiparesis due to recent stroke ([place]) 09/07/2017  . Hypothyroid 03/04/2017  . Diastolic dysfunction 00/93/8182  . Urinary frequency 09/17/2016  . H/O: CVA (cerebrovascular accident) 08/15/2016  . HTN (hypertension) 08/15/2016  . GERD (gastroesophageal reflux disease) 08/06/2015  . Hearing loss 08/06/2015  . Estrogen deficiency 05/01/2014  . Left knee pain 09/22/2013  . Encounter for Medicare annual wellness exam 04/04/2013  . Encounter for examination for admission to assisted living facility 04/04/2013  . History of melanoma excision 09/27/2012  . History of breast cancer, left 01/08/2011  . Chronic abdominal pain 09/17/2010  . History of  pelvic fracture 09/17/2010  . DEGENERATIVE JOINT DISEASE 02/13/2010  . Migraine variant 11/06/2009  . Osteopenia 10/18/2008  . Transient ischemic attack 09/10/2008  . DM type 2 with diabetic peripheral neuropathy ([place]) 10/06/2007  . VENOUS INSUFFICIENCY 10/06/2007  . Diverticulosis of colon 10/06/2007  . FIBROCYSTIC BREAST DISEASE 10/06/2007  . Hyperlipidemia associated with type 2 diabetes mellitus ([place]) 11/29/2006    Past Surgical History:  Procedure Laterality Date  . APPENDECTOMY    . ARM SKIN LESION BIOPSY / EXCISION Right 11/02/2016  . BREAST BIOPSY Left 10/18/2007  . BREAST CYST ASPIRATION    . BREAST EXCISIONAL BIOPSY Right    x2  . BREAST LUMPECTOMY  09/2007   with sentinel LN biopsy  . BREAST LUMPECTOMY     left  . EYE SURGERY     retinal, then cataract    Prior to Admission medications   Medication Sig Start Date End Date Taking? Authorizing Provider  acetaminophen (TYLENOL) 325 MG tablet Take 2 tablets (650 mg total) by mouth every 6 (six) hours as needed for mild pain (or Fever >/= 101). 03/02/16   Laqueta Linden, MD  alum & mag hydroxide-simeth (MAALOX/MYLANTA) 200-200-20 MG/5ML suspension Take 30 mLs by mouth every 6 (six) hours as needed (gas, abdominal pain).    [provider]  Blood Glucose Monitoring Suppl (FREESTYLE LITE) DEVI USE AS DIRECTED TO CHECK BLOOD SUGAR ONCE A DAY AND AS NEEDED (DX. E11.9) 06/16/18   Tower, Wynelle Fanny, MD  cephALEXin [facility])  250 MG capsule Take 1 capsule (250 mg total) by mouth 2 (two) times daily for 5 days. 10/01/20 10/06/20  Lavonia Drafts, MD  Cholecalciferol (VITAMIN D3) 1.25 MG (50000 UT) CAPS Take 50,000 Units by mouth every 30 (thirty) days.    [provider]  citalopram (CELEXA) 10 MG tablet Take 0.5 tablets (5 mg total) by mouth daily. 11/22/18   Tower, Wynelle Fanny, MD  clopidogrel (PLAVIX) 75 MG tablet TAKE ONE TABLET BY MOUTH EVERY MORNING WITH BREAKFAST. Patient taking differently: Take 75 mg by mouth  daily.  11/22/18   Tower, Wynelle Fanny, MD  dicyclomine (BENTYL) 10 MG capsule Take 10 mg by mouth 2 (two) times daily.    [provider]  fluticasone (FLONASE) 50 MCG/ACT nasal spray Place 1 spray into both nostrils daily.     [provider]  glucose blood (FREESTYLE LITE) test strip USE AS DIRECTED TO CHECK BLOOD SUGAR ONCE A DAY AND AS NEEDED (DX. E11.9) 06/16/18   Tower, Wynelle Fanny, MD  Lancets (FREESTYLE) lancets USE AS DIRECTED TO CHECK BLOOD SUGAR ONCE A DAY AND AS NEEDED (DX. E11.9) 06/16/18   Tower, Wynelle Fanny, MD  levothyroxine (SYNTHROID) 50 MCG tablet Take 1 tablet (50 mcg total) by mouth daily before breakfast. 30 minutes before other medicines or supplements 11/22/18   Tower, Wynelle Fanny, MD  metFORMIN (GLUCOPHAGE) 500 MG tablet Take 0.5 tablets (250 mg total) by mouth 2 (two) times daily with a meal. 11/22/18   Tower, Wynelle Fanny, MD  Multiple Vitamins-Minerals (PRESERVISION AREDS 2 PO) Take 1 tablet by mouth 2 (two) times daily.    [provider]  pantoprazole (PROTONIX) 40 MG tablet Take 1 tablet (40 mg total) by mouth daily. 11/22/18   Tower, Wynelle Fanny, MD  polyethylene glycol powder (GLYCOLAX/MIRALAX) powder DISSOLVE ONE (1) CAPFUL (17GM) INTO 4 TO8 OUNCES OF FLUID AND TAKE BYMOUTH ONCE DAILY AS DIRECTED Patient taking differently: Take 17 g by mouth daily. DISSOLVE ONE (1) CAPFUL (17GM) INTO 4 TO8 OUNCES OF FLUID AND TAKE BYMOUTH ONCE DAILY AS DIRECTED 03/23/17   Tower, Wynelle Fanny, MD  polyvinyl alcohol (LIQUIFILM TEARS) 1.4 % ophthalmic solution Place 1 drop into both eyes 2 (two) times daily.    [provider]  Probiotic Product (ALIGN) 4 MG CAPS Take 4 mg by mouth daily.     [provider]  sennosides-docusate sodium (SENOKOT-S) 8.6-50 MG tablet Take 1 tablet by mouth daily as needed for constipation.    [provider]  simvastatin (ZOCOR) 20 MG tablet Take 1 tablet (20 mg total) by mouth at bedtime. 11/22/18   Tower, Wynelle Fanny, MD      Allergies Sulfonamide derivatives and Aspirin  Family History  Problem Relation Age of Onset  . Breast cancer Sister   . Stroke Sister   . Cancer Sister        breast  . Breast cancer Sister   . Stroke Sister   . Cancer Sister        breast  . Stroke Mother   . Stroke Father   . Stroke Brother   . Cancer Brother        prostate  . Stroke Brother   . Stroke Sister   . CAD Sister     Social History Social History   Tobacco Use  . Smoking status: Never Smoker  . Smokeless tobacco: Never Used  Vaping Use  . Vaping Use: Never used  Substance Use Topics  . Alcohol use: No  Alcohol/week: 0.0 standard drinks  . Drug use: No    Review of Systems  Constitutional: No fever/chills  ENT: Epistaxis Cardiovascular: Denies chest pain. Respiratory: Denies shortness of breath. Gastrointestinal: No abdominal pain.     Skin: Negative for rash. Neurological: Negative for headaches   ____________________________________________   PHYSICAL EXAM:  VITAL SIGNS: ED Triage Vitals  Enc Vitals Group     BP 10/01/20 1947 (!) 176/78     Pulse Rate 10/01/20 1947 74     Resp 10/01/20 1947 18     Temp 10/01/20 1947 (!) 97.3 F (36.3 C)     Temp Source 10/01/20 1947 Axillary     SpO2 10/01/20 1945 94 %     Weight 10/01/20 1949 55.3 kg (122 lb)     Height 10/01/20 1949 1.6 m (5\' 3" )     Head Circumference --      Peak Flow --      Pain Score 10/01/20 1948 0     Pain Loc --      Pain Edu? --      Excl. in [place]? --     Constitutional: Alert No acute distress. Pleasant and interactive Eyes: Conjunctivae are normal.  Head: Atraumatic. Nose: Left naris: Slow oozing of blood Mouth/Throat: Mucous membranes are moist.  Some blood in the posterior pharynx, not a great deal  Cardiovascular: Normal rate, regular rhythm. Good peripheral circulation. Respiratory: Normal respiratory effort.  No retractions. Gastrointestinal: Soft and nontender. No distention.    Neurologic:   Normal speech and language. No gross focal neurologic deficits are appreciated.  Skin:  Skin is warm, dry and intact. No rash noted. Psychiatric: Mood and affect are normal. Speech and behavior are normal.  ____________________________________________   LABS (all labs ordered are listed, but only abnormal results are displayed)  Labs Reviewed - No data to display ____________________________________________  EKG  Nonee ____________________________________________  RADIOLOGY  Non ____________________________________________   PROCEDURES  Procedure(s) performed: yes  .Epistaxis Management  Date/Time: 10/01/2020 9:21 PM Performed by: Lavonia Drafts, MD Authorized by: Lavonia Drafts, MD   Consent:    Consent obtained:  Verbal   Consent given by:  Patient   Risks, benefits, and alternatives were discussed: yes     Risks discussed:  Bleeding, infection, nasal injury and pain Anesthesia:    Anesthesia method:  Topical application   Topical anesthetic:  Lidocaine gel Procedure details:    Treatment site:  L anterior   Treatment method:  Merocel sponge   Treatment episode: initial   Post-procedure details:    Assessment:  Bleeding stopped   Procedure completion:  Tolerated well, no immediate complications     Critical Care performed: No ____________________________________________   INITIAL IMPRESSION / ASSESSMENT AND PLAN / ED COURSE  Pertinent labs & imaging results that were available during my care of the patient were reviewed by me and considered in my medical decision making (see chart for details).  Patient with bleeding from the left naris, reportedly for several hours.  Overall she is well-appearing and in no acute distress, vital signs are overall reassuring, mild hypertension  Has had a nasal clamp on which has not stopped the bleeding.  Merocel sponge inserted which successfully stopped bleeding, will place on prophylactic antibiotics, follow-up with  ENT    ____________________________________________   FINAL CLINICAL IMPRESSION(S) / ED DIAGNOSES  Final diagnoses:  Left-sided epistaxis        Note:  This document was prepared using Dragon voice recognition software and may  include unintentional dictation errors.   Lavonia Drafts, MD 10/01/20 2122

## 2020-11-01 ENCOUNTER — Emergency Department: Payer: Medicare Other

## 2020-11-01 ENCOUNTER — Inpatient Hospital Stay
Admission: EM | Admit: 2020-11-01 | Discharge: 2020-11-08 | DRG: 480 | Disposition: A | Discharge status: Skilled Nursing Facility | Payer: Medicare Other | Attending: Internal Medicine | Admitting: Internal Medicine

## 2020-11-01 ENCOUNTER — Other Ambulatory Visit: Payer: Self-pay

## 2020-11-01 DIAGNOSIS — E871 Hypo-osmolality and hyponatremia: Secondary | ICD-10-CM | POA: Diagnosis present

## 2020-11-01 DIAGNOSIS — I6340 Cerebral infarction due to embolism of unspecified cerebral artery: Secondary | ICD-10-CM | POA: Diagnosis not present

## 2020-11-01 DIAGNOSIS — R5383 Other fatigue: Secondary | ICD-10-CM | POA: Diagnosis not present

## 2020-11-01 DIAGNOSIS — Z882 Allergy status to sulfonamides status: Secondary | ICD-10-CM

## 2020-11-01 DIAGNOSIS — E1169 Type 2 diabetes mellitus with other specified complication: Secondary | ICD-10-CM | POA: Diagnosis present

## 2020-11-01 DIAGNOSIS — E43 Unspecified severe protein-calorie malnutrition: Secondary | ICD-10-CM | POA: Diagnosis present

## 2020-11-01 DIAGNOSIS — E785 Hyperlipidemia, unspecified: Secondary | ICD-10-CM | POA: Diagnosis present

## 2020-11-01 DIAGNOSIS — Z419 Encounter for procedure for purposes other than remedying health state, unspecified: Secondary | ICD-10-CM

## 2020-11-01 DIAGNOSIS — N179 Acute kidney failure, unspecified: Secondary | ICD-10-CM | POA: Diagnosis not present

## 2020-11-01 DIAGNOSIS — K219 Gastro-esophageal reflux disease without esophagitis: Secondary | ICD-10-CM | POA: Diagnosis present

## 2020-11-01 DIAGNOSIS — Z515 Encounter for palliative care: Secondary | ICD-10-CM | POA: Diagnosis not present

## 2020-11-01 DIAGNOSIS — F32A Depression, unspecified: Secondary | ICD-10-CM | POA: Diagnosis present

## 2020-11-01 DIAGNOSIS — E119 Type 2 diabetes mellitus without complications: Secondary | ICD-10-CM | POA: Diagnosis not present

## 2020-11-01 DIAGNOSIS — R29712 NIHSS score 12: Secondary | ICD-10-CM | POA: Diagnosis present

## 2020-11-01 DIAGNOSIS — R339 Retention of urine, unspecified: Secondary | ICD-10-CM | POA: Diagnosis not present

## 2020-11-01 DIAGNOSIS — R630 Anorexia: Secondary | ICD-10-CM | POA: Diagnosis present

## 2020-11-01 DIAGNOSIS — Y92099 Unspecified place in other non-institutional residence as the place of occurrence of the external cause: Secondary | ICD-10-CM

## 2020-11-01 DIAGNOSIS — I480 Paroxysmal atrial fibrillation: Secondary | ICD-10-CM | POA: Diagnosis not present

## 2020-11-01 DIAGNOSIS — K5909 Other constipation: Secondary | ICD-10-CM | POA: Diagnosis present

## 2020-11-01 DIAGNOSIS — I63113 Cerebral infarction due to embolism of bilateral vertebral arteries: Secondary | ICD-10-CM

## 2020-11-01 DIAGNOSIS — I69354 Hemiplegia and hemiparesis following cerebral infarction affecting left non-dominant side: Secondary | ICD-10-CM | POA: Diagnosis not present

## 2020-11-01 DIAGNOSIS — Z7401 Bed confinement status: Secondary | ICD-10-CM

## 2020-11-01 DIAGNOSIS — N6019 Diffuse cystic mastopathy of unspecified breast: Secondary | ICD-10-CM | POA: Diagnosis present

## 2020-11-01 DIAGNOSIS — Z85820 Personal history of malignant melanoma of skin: Secondary | ICD-10-CM

## 2020-11-01 DIAGNOSIS — R410 Disorientation, unspecified: Secondary | ICD-10-CM | POA: Diagnosis not present

## 2020-11-01 DIAGNOSIS — D62 Acute posthemorrhagic anemia: Secondary | ICD-10-CM | POA: Diagnosis not present

## 2020-11-01 DIAGNOSIS — Z803 Family history of malignant neoplasm of breast: Secondary | ICD-10-CM

## 2020-11-01 DIAGNOSIS — R710 Precipitous drop in hematocrit: Secondary | ICD-10-CM

## 2020-11-01 DIAGNOSIS — I10 Essential (primary) hypertension: Secondary | ICD-10-CM | POA: Diagnosis present

## 2020-11-01 DIAGNOSIS — E039 Hypothyroidism, unspecified: Secondary | ICD-10-CM | POA: Diagnosis present

## 2020-11-01 DIAGNOSIS — D72829 Elevated white blood cell count, unspecified: Secondary | ICD-10-CM

## 2020-11-01 DIAGNOSIS — Z8249 Family history of ischemic heart disease and other diseases of the circulatory system: Secondary | ICD-10-CM

## 2020-11-01 DIAGNOSIS — Z681 Body mass index (BMI) 19 or less, adult: Secondary | ICD-10-CM

## 2020-11-01 DIAGNOSIS — W010XXA Fall on same level from slipping, tripping and stumbling without subsequent striking against object, initial encounter: Secondary | ICD-10-CM | POA: Diagnosis present

## 2020-11-01 DIAGNOSIS — W19XXXA Unspecified fall, initial encounter: Secondary | ICD-10-CM

## 2020-11-01 DIAGNOSIS — Z8673 Personal history of transient ischemic attack (TIA), and cerebral infarction without residual deficits: Secondary | ICD-10-CM

## 2020-11-01 DIAGNOSIS — S72001A Fracture of unspecified part of neck of right femur, initial encounter for closed fracture: Secondary | ICD-10-CM

## 2020-11-01 DIAGNOSIS — R918 Other nonspecific abnormal finding of lung field: Secondary | ICD-10-CM | POA: Diagnosis present

## 2020-11-01 DIAGNOSIS — I672 Cerebral atherosclerosis: Secondary | ICD-10-CM | POA: Diagnosis present

## 2020-11-01 DIAGNOSIS — R062 Wheezing: Secondary | ICD-10-CM | POA: Diagnosis not present

## 2020-11-01 DIAGNOSIS — R911 Solitary pulmonary nodule: Secondary | ICD-10-CM | POA: Diagnosis present

## 2020-11-01 DIAGNOSIS — R531 Weakness: Secondary | ICD-10-CM | POA: Diagnosis not present

## 2020-11-01 DIAGNOSIS — Z886 Allergy status to analgesic agent status: Secondary | ICD-10-CM

## 2020-11-01 DIAGNOSIS — Z7901 Long term (current) use of anticoagulants: Secondary | ICD-10-CM

## 2020-11-01 DIAGNOSIS — Z79899 Other long term (current) drug therapy: Secondary | ICD-10-CM

## 2020-11-01 DIAGNOSIS — R471 Dysarthria and anarthria: Secondary | ICD-10-CM | POA: Diagnosis not present

## 2020-11-01 DIAGNOSIS — S72141A Displaced intertrochanteric fracture of right femur, initial encounter for closed fracture: Secondary | ICD-10-CM | POA: Diagnosis present

## 2020-11-01 DIAGNOSIS — Z20822 Contact with and (suspected) exposure to covid-19: Secondary | ICD-10-CM | POA: Diagnosis present

## 2020-11-01 DIAGNOSIS — Z7984 Long term (current) use of oral hypoglycemic drugs: Secondary | ICD-10-CM

## 2020-11-01 DIAGNOSIS — S72001S Fracture of unspecified part of neck of right femur, sequela: Secondary | ICD-10-CM

## 2020-11-01 DIAGNOSIS — R000 Tachycardia, unspecified: Secondary | ICD-10-CM | POA: Diagnosis not present

## 2020-11-01 DIAGNOSIS — R059 Cough, unspecified: Secondary | ICD-10-CM

## 2020-11-01 DIAGNOSIS — Z9049 Acquired absence of other specified parts of digestive tract: Secondary | ICD-10-CM

## 2020-11-01 DIAGNOSIS — Z7902 Long term (current) use of antithrombotics/antiplatelets: Secondary | ICD-10-CM

## 2020-11-01 DIAGNOSIS — I69392 Facial weakness following cerebral infarction: Secondary | ICD-10-CM

## 2020-11-01 DIAGNOSIS — Z66 Do not resuscitate: Secondary | ICD-10-CM | POA: Diagnosis present

## 2020-11-01 DIAGNOSIS — S72141S Displaced intertrochanteric fracture of right femur, sequela: Secondary | ICD-10-CM | POA: Diagnosis not present

## 2020-11-01 DIAGNOSIS — Z853 Personal history of malignant neoplasm of breast: Secondary | ICD-10-CM

## 2020-11-01 DIAGNOSIS — I872 Venous insufficiency (chronic) (peripheral): Secondary | ICD-10-CM | POA: Diagnosis present

## 2020-11-01 DIAGNOSIS — Z7189 Other specified counseling: Secondary | ICD-10-CM | POA: Diagnosis not present

## 2020-11-01 DIAGNOSIS — I6389 Other cerebral infarction: Secondary | ICD-10-CM | POA: Diagnosis not present

## 2020-11-01 DIAGNOSIS — Z79890 Hormone replacement therapy (postmenopausal): Secondary | ICD-10-CM

## 2020-11-01 DIAGNOSIS — Z823 Family history of stroke: Secondary | ICD-10-CM

## 2020-11-01 DIAGNOSIS — G9341 Metabolic encephalopathy: Secondary | ICD-10-CM | POA: Diagnosis not present

## 2020-11-01 LAB — RESP PANEL BY RT-PCR (FLU A&B, COVID) ARPGX2
Influenza A by PCR: NEGATIVE
Influenza B by PCR: NEGATIVE
SARS Coronavirus 2 by RT PCR: NEGATIVE

## 2020-11-01 LAB — CBC
HCT: 33.5 % — ABNORMAL LOW (ref 36.0–46.0)
Hemoglobin: 11.3 g/dL — ABNORMAL LOW (ref 12.0–15.0)
MCH: 29.9 pg (ref 26.0–34.0)
MCHC: 33.7 g/dL (ref 30.0–36.0)
MCV: 88.6 fL (ref 80.0–100.0)
Platelets: 210 10*3/uL (ref 150–400)
RBC: 3.78 MIL/uL — ABNORMAL LOW (ref 3.87–5.11)
RDW: 13.9 % (ref 11.5–15.5)
WBC: 11.2 10*3/uL — ABNORMAL HIGH (ref 4.0–10.5)
nRBC: 0 % (ref 0.0–0.2)

## 2020-11-01 LAB — BASIC METABOLIC PANEL
Anion gap: 9 (ref 5–15)
BUN: 12 mg/dL (ref 8–23)
CO2: 23 mmol/L (ref 22–32)
Calcium: 9.1 mg/dL (ref 8.9–10.3)
Chloride: 104 mmol/L (ref 98–111)
Creatinine, Ser: 0.69 mg/dL (ref 0.44–1.00)
GFR, Estimated: >60 mL/min (ref >60)
Glucose, Bld: 120 mg/dL — ABNORMAL HIGH (ref 70–99)
Potassium: 4.1 mmol/L (ref 3.5–5.1)
Sodium: 136 mmol/L (ref 135–145)

## 2020-11-01 LAB — GLUCOSE, CAPILLARY: Glucose-Capillary: 168 mg/dL — ABNORMAL HIGH (ref 70–99)

## 2020-11-01 LAB — CBG MONITORING, ED: Glucose-Capillary: 115 mg/dL — ABNORMAL HIGH (ref 70–99)

## 2020-11-01 LAB — TYPE AND SCREEN
ABO/RH(D): O POS
Antibody Screen: NEGATIVE

## 2020-11-01 MED ORDER — OCUVITE-LUTEIN PO CAPS
1.0000 | ORAL_CAPSULE | Freq: Two times a day (BID) | ORAL | Status: DC
  Administered 2020-11-01 – 2020-11-08 (×13): 1 via ORAL
  Filled 2020-11-01 (×15): qty 1

## 2020-11-01 MED ORDER — POLYETHYLENE GLYCOL 3350 17 G PO PACK
17.0000 g | PACK | Freq: Every day | ORAL | Status: DC
  Administered 2020-11-03 – 2020-11-08 (×4): 17 g via ORAL
  Filled 2020-11-01 (×3): qty 1

## 2020-11-01 MED ORDER — DICYCLOMINE HCL 10 MG PO CAPS
10.0000 mg | ORAL_CAPSULE | Freq: Two times a day (BID) | ORAL | Status: DC
  Administered 2020-11-01 – 2020-11-08 (×13): 10 mg via ORAL
  Filled 2020-11-01 (×15): qty 1

## 2020-11-01 MED ORDER — CITALOPRAM HYDROBROMIDE 10 MG PO TABS
5.0000 mg | ORAL_TABLET | Freq: Every day | ORAL | Status: DC
  Administered 2020-11-03 – 2020-11-08 (×5): 5 mg via ORAL
  Filled 2020-11-01 (×7): qty 1

## 2020-11-01 MED ORDER — LEVOTHYROXINE SODIUM 50 MCG PO TABS
50.0000 ug | ORAL_TABLET | Freq: Every day | ORAL | Status: DC
  Administered 2020-11-02 – 2020-11-08 (×6): 50 ug via ORAL
  Filled 2020-11-01 (×2): qty 1
  Filled 2020-11-01: qty 2
  Filled 2020-11-01 (×3): qty 1

## 2020-11-01 MED ORDER — FENTANYL CITRATE (PF) 100 MCG/2ML IJ SOLN
50.0000 ug | Freq: Once | INTRAMUSCULAR | Status: AC
  Administered 2020-11-01: 50 ug via INTRAVENOUS
  Filled 2020-11-01: qty 2

## 2020-11-01 MED ORDER — SENNOSIDES-DOCUSATE SODIUM 8.6-50 MG PO TABS: 1.0000 | ORAL_TABLET | Freq: Every day | ORAL | Status: DC | PRN

## 2020-11-01 MED ORDER — POLYVINYL ALCOHOL 1.4 % OP SOLN
1.0000 [drp] | Freq: Two times a day (BID) | OPHTHALMIC | Status: DC
  Administered 2020-11-01 – 2020-11-08 (×12): 1 [drp] via OPHTHALMIC
  Filled 2020-11-01: qty 15

## 2020-11-01 MED ORDER — SIMVASTATIN 20 MG PO TABS
20.0000 mg | ORAL_TABLET | Freq: Every day | ORAL | Status: DC
  Administered 2020-11-01 – 2020-11-07 (×7): 20 mg via ORAL
  Filled 2020-11-01 (×7): qty 1

## 2020-11-01 MED ORDER — MORPHINE SULFATE (PF) 2 MG/ML IV SOLN
0.5000 mg | INTRAVENOUS | Status: DC | PRN
Start: 2020-11-01 — End: 2020-11-04
  Administered 2020-11-01 (×2): 0.5 mg via INTRAVENOUS
  Filled 2020-11-01 (×2): qty 1

## 2020-11-01 MED ORDER — CEFAZOLIN SODIUM-DEXTROSE 2-4 GM/100ML-% IV SOLN
2.0000 g | INTRAVENOUS | Status: AC
  Administered 2020-11-02: 2 g via INTRAVENOUS

## 2020-11-01 MED ORDER — METHOCARBAMOL 1000 MG/10ML IJ SOLN
500.0000 mg | Freq: Four times a day (QID) | INTRAVENOUS | Status: DC | PRN
  Filled 2020-11-01: qty 5

## 2020-11-01 MED ORDER — METHOCARBAMOL 500 MG PO TABS
500.0000 mg | ORAL_TABLET | Freq: Four times a day (QID) | ORAL | Status: DC | PRN
  Filled 2020-11-01: qty 1

## 2020-11-01 MED ORDER — PANTOPRAZOLE SODIUM 40 MG PO TBEC
40.0000 mg | DELAYED_RELEASE_TABLET | Freq: Every day | ORAL | Status: DC
  Administered 2020-11-03 – 2020-11-08 (×6): 40 mg via ORAL
  Filled 2020-11-01 (×6): qty 1

## 2020-11-01 MED ORDER — HYDROCODONE-ACETAMINOPHEN 5-325 MG PO TABS: 1.0000 | ORAL_TABLET | Freq: Four times a day (QID) | ORAL | Status: DC | PRN

## 2020-11-01 MED ORDER — ACETAMINOPHEN 325 MG PO TABS: 650.0000 mg | ORAL_TABLET | Freq: Four times a day (QID) | ORAL | Status: DC | PRN

## 2020-11-01 MED ORDER — ADULT MULTIVITAMIN W/MINERALS CH
1.0000 | ORAL_TABLET | Freq: Every day | ORAL | Status: DC
  Administered 2020-11-03 – 2020-11-08 (×6): 1 via ORAL
  Filled 2020-11-01 (×6): qty 1

## 2020-11-01 MED ORDER — VITAMIN D (ERGOCALCIFEROL) 1.25 MG (50000 UNIT) PO CAPS: 50000.0000 [IU] | ORAL_CAPSULE | ORAL | Status: DC

## 2020-11-01 MED ORDER — MORPHINE SULFATE (PF) 2 MG/ML IV SOLN
0.5000 mg | Freq: Once | INTRAVENOUS | Status: AC
  Administered 2020-11-01: 0.5 mg via INTRAVENOUS
  Filled 2020-11-01: qty 1

## 2020-11-01 MED ORDER — ONDANSETRON HCL 4 MG/2ML IJ SOLN
4.0000 mg | Freq: Once | INTRAMUSCULAR | Status: AC
  Administered 2020-11-01: 4 mg via INTRAVENOUS
  Filled 2020-11-01: qty 2

## 2020-11-01 MED ORDER — ENSURE ENLIVE PO LIQD
237.0000 mL | Freq: Two times a day (BID) | ORAL | Status: DC
  Administered 2020-11-03 – 2020-11-06 (×6): 237 mL via ORAL

## 2020-11-01 NOTE — ED Notes (Signed)
rn applied 2 l o2 via [place] after medication administration.

## 2020-11-01 NOTE — ED Notes (Signed)
Pt with xray at this time.

## 2020-11-01 NOTE — ED Notes (Signed)
Per bed placement, bed is going to be taken back.

## 2020-11-01 NOTE — ED Notes (Signed)
Transportation requested  

## 2020-11-01 NOTE — Consult Note (Signed)
ORTHOPAEDIC CONSULTATION  REQUESTING PHYSICIAN: Collier Bullock, MD  Chief Complaint:   Right hip pain.  History of Present Illness: Lauren [name] is a 87 y.o. female with multiple medical problems including diabetes, hyperlipidemia, gout, gastritis, melanoma, history of breast cancer, and venous insufficiency who lives in a rehab facility and is able to ambulate independently with a rolling walker.  The patient was in her usual state of health this morning when she apparently lost her balance and fell, landing on her right hip.  She was brought to the emergency room where x-rays demonstrated a displaced intertrochanteric fracture of the right hip.  The patient denies any associated injuries.  She did not strike her head or lose consciousness.  The patient also denies any lightheadedness, dizziness, chest pain, shortness of breath, or other symptoms which may have precipitated her fall.  Past Medical History:  Diagnosis Date   Breast cancer ([place])    left breast   Chronic constipation    Chronic headaches    Diabetes mellitus    Fibrocystic breast    Gastritis    Gout    injection in the past   Hyperlipidemia    Melanoma in situ of left upper extremity ([facility])    Pelvic fracture ([facility])    TIA (transient ischemic attack) 08/2008   neg MRI/MRA and CT and carotid dopplers   Venous insufficiency    Past Surgical History:  Procedure Laterality Date   APPENDECTOMY     ARM SKIN LESION BIOPSY / EXCISION Right 11/02/2016   BREAST BIOPSY Left 10/18/2007   BREAST CYST ASPIRATION     BREAST EXCISIONAL BIOPSY Right    x2   BREAST LUMPECTOMY  09/2007   with sentinel LN biopsy   BREAST LUMPECTOMY     left   EYE SURGERY     retinal, then cataract   Social History   Socioeconomic History   Marital status: Single    Spouse name: Not on file   Number of children: Not on file   Years of education: college   Highest education  level: Not on file  Occupational History   Not on file  Tobacco Use   Smoking status: Never   Smokeless tobacco: Never  Vaping Use   Vaping Use: Never used  Substance and Sexual Activity   Alcohol use: No    Alcohol/week: 0.0 standard drinks   Drug use: No   Sexual activity: Not Currently  Other Topics Concern   Not on file  Social History Narrative   Lives with and cares for both sisters   Strong faith   Denies caffeine use    Social Determinants of Health   Financial Resource Strain: Not on file  Food Insecurity: Not on file  Transportation Needs: Not on file  Physical Activity: Not on file  Stress: Not on file  Social Connections: Not on file   Family History  Problem Relation Age of Onset   Breast cancer Sister    Stroke Sister    Cancer Sister        breast   Breast cancer Sister    Stroke Sister    Cancer Sister        breast   Stroke Mother    Stroke Father    Stroke Brother    Cancer Brother        prostate   Stroke Brother    Stroke Sister    CAD Sister    Allergies  Allergen Reactions  Sulfonamide Derivatives Itching   Aspirin Other (See Comments)    Burning and pain in stomach   Prior to Admission medications   Medication Sig Start Date End Date Taking? Authorizing Provider  Cholecalciferol (VITAMIN D3) 1.25 MG (50000 UT) CAPS Take 50,000 Units by mouth every 30 (thirty) days.   Yes [provider]  citalopram (CELEXA) 10 MG tablet Take 0.5 tablets (5 mg total) by mouth daily. 11/22/18  Yes Tower, Wynelle Fanny, MD  clopidogrel (PLAVIX) 75 MG tablet TAKE ONE TABLET BY MOUTH EVERY MORNING WITH BREAKFAST. Patient taking differently: Take 75 mg by mouth daily. 11/22/18  Yes Tower, Wynelle Fanny, MD  dicyclomine (BENTYL) 10 MG capsule Take 10 mg by mouth 2 (two) times daily.   Yes [provider]  docusate sodium (COLACE) 100 MG capsule Take 100 mg by mouth daily.   Yes [provider]  fluticasone (FLONASE) 50 MCG/ACT nasal spray  Place 1 spray into both nostrils daily.    Yes [provider]  gabapentin (NEURONTIN) 100 MG capsule Take 100 mg by mouth at bedtime. 10/31/20  Yes [provider]  levothyroxine (SYNTHROID) 50 MCG tablet Take 1 tablet (50 mcg total) by mouth daily before breakfast. 30 minutes before other medicines or supplements 11/22/18  Yes Tower, Wynelle Fanny, MD  metFORMIN (GLUCOPHAGE) 500 MG tablet Take 0.5 tablets (250 mg total) by mouth 2 (two) times daily with a meal. 11/22/18  Yes Tower, Wynelle Fanny, MD  Multiple Vitamins-Minerals (PRESERVISION AREDS 2 PO) Take 1 tablet by mouth 2 (two) times daily.   Yes [provider]  oxybutynin (DITROPAN-XL) 5 MG 24 hr tablet Take 5 mg by mouth daily. 10/31/20  Yes [provider]  pantoprazole (PROTONIX) 40 MG tablet Take 1 tablet (40 mg total) by mouth daily. 11/22/18  Yes Tower, Marne A, MD  polyethylene glycol powder (GLYCOLAX/MIRALAX) powder DISSOLVE ONE (1) CAPFUL (17GM) INTO 4 TO8 OUNCES OF FLUID AND TAKE BYMOUTH ONCE DAILY AS DIRECTED Patient taking differently: Take 17 g by mouth daily. DISSOLVE ONE (1) CAPFUL (17GM) INTO 4 TO8 OUNCES OF FLUID AND TAKE BYMOUTH ONCE DAILY AS DIRECTED 03/23/17  Yes Tower, Wynelle Fanny, MD  polyvinyl alcohol (LIQUIFILM TEARS) 1.4 % ophthalmic solution Place 1 drop into both eyes 2 (two) times daily.   Yes [provider]  Probiotic Product (ALIGN) 4 MG CAPS Take 4 mg by mouth daily.    Yes [provider]  simvastatin (ZOCOR) 20 MG tablet Take 1 tablet (20 mg total) by mouth at bedtime. 11/22/18  Yes Tower, Wynelle Fanny, MD  sulfamethoxazole-trimethoprim (BACTRIM DS) 800-160 MG tablet Take 1 tablet by mouth 2 (two) times daily. 10/31/20  Yes [provider]  vitamin B-12 (CYANOCOBALAMIN) 500 MCG tablet Take 500 mcg by mouth daily.   Yes [provider]  acetaminophen (TYLENOL) 325 MG tablet Take 2 tablets (650 mg total) by mouth every 6 (six) hours as needed for mild pain (or Fever >/=  101). 03/02/16   Laqueta Linden, MD  alum & mag hydroxide-simeth (MAALOX/MYLANTA) 200-200-20 MG/5ML suspension Take 30 mLs by mouth every 6 (six) hours as needed (gas, abdominal pain).    [provider]  Blood Glucose Monitoring Suppl (FREESTYLE LITE) DEVI USE AS DIRECTED TO CHECK BLOOD SUGAR ONCE A DAY AND AS NEEDED (DX. E11.9) 06/16/18   Tower, [place] A, MD  glucose blood (FREESTYLE LITE) test strip USE AS DIRECTED TO CHECK BLOOD SUGAR ONCE A DAY AND AS NEEDED (DX. E11.9) 06/16/18  Tower, Wynelle Fanny, MD  Lancets (FREESTYLE) lancets USE AS DIRECTED TO CHECK BLOOD SUGAR ONCE A DAY AND AS NEEDED (DX. E11.9) 06/16/18   Tower, Wynelle Fanny, MD  omeprazole (PRILOSEC) 20 MG capsule Take 20 mg by mouth daily. Patient not taking: No sig reported    [provider]  sennosides-docusate sodium (SENOKOT-S) 8.6-50 MG tablet Take 1 tablet by mouth daily as needed for constipation.    [provider]   DG Chest 1 View  Result Date: 11/01/2020 CLINICAL DATA:  Fall. EXAM: CHEST  1 VIEW COMPARISON:  April 20, 2017 FINDINGS: The heart size and mediastinal contours are within normal limits. Both lungs are clear. The visualized skeletal structures are unremarkable. IMPRESSION: No active disease. Electronically Signed   By: Dorise Bullion III M.D   On: 11/01/2020 13:35   DG Hip Unilat  With Pelvis 2-3 Views Right  Result Date: 11/01/2020 CLINICAL DATA:  Fall.  Pain. EXAM: DG HIP (WITH OR WITHOUT PELVIS) 2-3V RIGHT COMPARISON:  None. FINDINGS: There is a comminuted intertrochanteric right hip fracture with displacement of fracture fragments. No dislocation. No other abnormalities. IMPRESSION: Comminuted displaced right hip intertrochanteric fracture. Electronically Signed   By: Dorise Bullion III M.D   On: 11/01/2020 13:33    Positive ROS: All other systems have been reviewed and were otherwise negative with the exception of those mentioned in the HPI and as above.  Physical Exam: General:   Alert, no acute distress Psychiatric:  Patient is competent for consent with normal mood and affect   Cardiovascular:  No pedal edema Respiratory:  No wheezing, non-labored breathing GI:  Abdomen is soft and non-tender Skin:  No lesions in the area of chief complaint Neurologic:  Sensation intact distally Lymphatic:  No axillary or cervical lymphadenopathy  Orthopedic Exam:  Orthopedic examination is limited to the right hip and lower extremity.  The right lower extremity somewhat shortened and externally rotated as compared to the left.  Skin inspection around the right hip is unremarkable.  No swelling, erythema, ecchymosis, abrasions, or other skin abnormalities are identified.  She has mild-moderate tenderness to palpation over the lateral aspect of the hip.  She has more severe pain with any passive or active motion of the hip.  She is able to dorsiflex and plantarflex her toes and ankle.  Sensation is intact to light touch to all distributions.  She has good capillary refill to her right foot.  X-rays:  X-rays of the pelvis and right hip are available for review and have been reviewed by myself.  These films demonstrate a displaced intertrochanteric fracture of the right hip.  The right hip joint itself appears to be well-maintained and without evidence for significant degenerative changes.  No other acute bony abnormalities are identified.  Assessment: Closed displaced intertrochanteric fracture right hip.  Plan: The treatment options, including both surgical and nonsurgical choices, have been discussed in detail with the patient and her family the patient and her family would like to proceed with surgical intervention to include an intramedullary nailing of the displaced intertrochanteric right hip fracture. The risks (including bleeding, infection, nerve and/or blood vessel injury, persistent or recurrent pain, loosening or failure of the components, malunion and/or nonunion, leg length  inequality, need for further surgery, blood clots, strokes, heart attacks or arrhythmias, pneumonia, etc.) and benefits of the surgical procedure were discussed. The patient states her understanding and agrees to proceed.  She agrees to a blood transfusion if necessary. A formal written consent has  been obtained.  Thank you for asking me to participate in the care of this most pleasant yet unfortunate woman.  I will be happy to follow her with you.   Pascal Lux, MD  Beeper #:  [phone]  11/01/2020 4:47 PM

## 2020-11-01 NOTE — ED Provider Notes (Signed)
[facility] Emergency Department Provider Note  ____________________________________________   Event Date/Time   First MD Initiated Contact with Patient 11/01/20 1228     (approximate)  I have reviewed the triage vital signs and the nursing notes.   HISTORY  Chief Complaint Fall    HPI MAKINSEY PEPITONE is a 87 y.o. female with diabetes who ambulates with a walker who comes in with a fall.  Patient states that she was walking with her walker when her legs felt weak and she fell onto her right hip.  She states that she did not hit her head did not lose consciousness.  Denies any headaches or confusion.  She is alert and oriented x3.  She is only reporting pain in her right hip, worse with movement, better at rest.  Denies any chest pain, shortness of breath or other concerns.  Patient is on Plavix          Past Medical History:  Diagnosis Date   Breast cancer ([place])    left breast   Chronic constipation    Chronic headaches    Diabetes mellitus    Fibrocystic breast    Gastritis    Gout    injection in the past   Hyperlipidemia    Melanoma in situ of left upper extremity ([place])    Pelvic fracture ([facility])    TIA (transient ischemic attack) 08/2008   neg MRI/MRA and CT and carotid dopplers   Venous insufficiency     Patient Active Problem List   Diagnosis Date Noted   Exposure to COVID-19 virus 12/26/2018   Fall at home, initial encounter 12/16/2018   Exposure to communicable disease 12/16/2018   Diabetic neuropathy ([place]) 11/22/2018   Weight loss 08/01/2018   Bloating 11/30/2017   Hemiparesis due to recent stroke ([place]) 09/07/2017   Hypothyroid 16/60/6301   Diastolic dysfunction 60/02/9322   Urinary frequency 09/17/2016   H/O: CVA (cerebrovascular accident) 08/15/2016   HTN (hypertension) 08/15/2016   GERD (gastroesophageal reflux disease) 08/06/2015   Hearing loss 08/06/2015   Estrogen deficiency 05/01/2014   Left knee pain 09/22/2013    Encounter for Medicare annual wellness exam 04/04/2013   Encounter for examination for admission to assisted living facility 04/04/2013   History of melanoma excision 09/27/2012   History of breast cancer, left 01/08/2011   Chronic abdominal pain 09/17/2010   History of pelvic fracture 09/17/2010   DEGENERATIVE JOINT DISEASE 02/13/2010   Migraine variant 11/06/2009   Osteopenia 10/18/2008   Transient ischemic attack 09/10/2008   DM type 2 with diabetic peripheral neuropathy ([place]) 10/06/2007   VENOUS INSUFFICIENCY 10/06/2007   Diverticulosis of colon 10/06/2007   FIBROCYSTIC BREAST DISEASE 10/06/2007   Hyperlipidemia associated with type 2 diabetes mellitus ([place]) 11/29/2006    Past Surgical History:  Procedure Laterality Date   APPENDECTOMY     ARM SKIN LESION BIOPSY / EXCISION Right 11/02/2016   BREAST BIOPSY Left 10/18/2007   BREAST CYST ASPIRATION     BREAST EXCISIONAL BIOPSY Right    x2   BREAST LUMPECTOMY  09/2007   with sentinel LN biopsy   BREAST LUMPECTOMY     left   EYE SURGERY     retinal, then cataract    Prior to Admission medications   Medication Sig Start Date End Date Taking? Authorizing Provider  acetaminophen (TYLENOL) 325 MG tablet Take 2 tablets (650 mg total) by mouth every 6 (six) hours as needed for mild pain (or Fever >/= 101). 03/02/16  Laqueta Linden, MD  alum & mag hydroxide-simeth (MAALOX/MYLANTA) 200-200-20 MG/5ML suspension Take 30 mLs by mouth every 6 (six) hours as needed (gas, abdominal pain).    [provider]  Blood Glucose Monitoring Suppl (FREESTYLE LITE) DEVI USE AS DIRECTED TO CHECK BLOOD SUGAR ONCE A DAY AND AS NEEDED (DX. E11.9) 06/16/18   Tower, Wynelle Fanny, MD  Cholecalciferol (VITAMIN D3) 1.25 MG (50000 UT) CAPS Take 50,000 Units by mouth every 30 (thirty) days.    [provider]  citalopram (CELEXA) 10 MG tablet Take 0.5 tablets (5 mg total) by mouth daily. 11/22/18   Tower, Wynelle Fanny, MD  clopidogrel (PLAVIX) 75 MG  tablet TAKE ONE TABLET BY MOUTH EVERY MORNING WITH BREAKFAST. Patient taking differently: Take 75 mg by mouth daily.  11/22/18   Tower, Wynelle Fanny, MD  dicyclomine (BENTYL) 10 MG capsule Take 10 mg by mouth 2 (two) times daily.    [provider]  fluticasone (FLONASE) 50 MCG/ACT nasal spray Place 1 spray into both nostrils daily.     [provider]  glucose blood (FREESTYLE LITE) test strip USE AS DIRECTED TO CHECK BLOOD SUGAR ONCE A DAY AND AS NEEDED (DX. E11.9) 06/16/18   Tower, Wynelle Fanny, MD  Lancets (FREESTYLE) lancets USE AS DIRECTED TO CHECK BLOOD SUGAR ONCE A DAY AND AS NEEDED (DX. E11.9) 06/16/18   Tower, Wynelle Fanny, MD  levothyroxine (SYNTHROID) 50 MCG tablet Take 1 tablet (50 mcg total) by mouth daily before breakfast. 30 minutes before other medicines or supplements 11/22/18   Tower, Wynelle Fanny, MD  metFORMIN (GLUCOPHAGE) 500 MG tablet Take 0.5 tablets (250 mg total) by mouth 2 (two) times daily with a meal. 11/22/18   Tower, Wynelle Fanny, MD  Multiple Vitamins-Minerals (PRESERVISION AREDS 2 PO) Take 1 tablet by mouth 2 (two) times daily.    [provider]  pantoprazole (PROTONIX) 40 MG tablet Take 1 tablet (40 mg total) by mouth daily. 11/22/18   Tower, Wynelle Fanny, MD  polyethylene glycol powder (GLYCOLAX/MIRALAX) powder DISSOLVE ONE (1) CAPFUL (17GM) INTO 4 TO8 OUNCES OF FLUID AND TAKE BYMOUTH ONCE DAILY AS DIRECTED Patient taking differently: Take 17 g by mouth daily. DISSOLVE ONE (1) CAPFUL (17GM) INTO 4 TO8 OUNCES OF FLUID AND TAKE BYMOUTH ONCE DAILY AS DIRECTED 03/23/17   Tower, Wynelle Fanny, MD  polyvinyl alcohol (LIQUIFILM TEARS) 1.4 % ophthalmic solution Place 1 drop into both eyes 2 (two) times daily.    [provider]  Probiotic Product (ALIGN) 4 MG CAPS Take 4 mg by mouth daily.     [provider]  sennosides-docusate sodium (SENOKOT-S) 8.6-50 MG tablet Take 1 tablet by mouth daily as needed for constipation.    [provider]  simvastatin (ZOCOR)  20 MG tablet Take 1 tablet (20 mg total) by mouth at bedtime. 11/22/18   Tower, Wynelle Fanny, MD    Allergies Sulfonamide derivatives and Aspirin  Family History  Problem Relation Age of Onset   Breast cancer Sister    Stroke Sister    Cancer Sister        breast   Breast cancer Sister    Stroke Sister    Cancer Sister        breast   Stroke Mother    Stroke Father    Stroke Brother    Cancer Brother        prostate   Stroke Brother    Stroke Sister    CAD Sister  Social History Social History   Tobacco Use   Smoking status: Never   Smokeless tobacco: Never  Vaping Use   Vaping Use: Never used  Substance Use Topics   Alcohol use: No    Alcohol/week: 0.0 standard drinks   Drug use: No      Review of Systems Constitutional: No fever/chills Eyes: No visual changes. ENT: No sore throat. Cardiovascular: Denies chest pain. Respiratory: Denies shortness of breath. Gastrointestinal: No abdominal pain.  No nausea, no vomiting.  No diarrhea.  No constipation. Genitourinary: Negative for dysuria. Musculoskeletal: Negative for back pain.  Right hip pain Skin: Negative for rash. Neurological: Negative for headaches, focal weakness or numbness. All other ROS negative ____________________________________________   PHYSICAL EXAM:  VITAL SIGNS: ED Triage Vitals [11/01/20 1236]  Enc Vitals Group     BP      Pulse      Resp      Temp      Temp src      SpO2      Weight      Height      Head Circumference      Peak Flow      Pain Score 8     Pain Loc      Pain Edu?      Excl. in [place]?     Constitutional: Alert and oriented. GCS 15  Eyes: Conjunctivae are normal. EOMI. Head: Atraumatic. Nose: No congestion/rhinnorhea. Mouth/Throat: Mucous membranes are moist.   Neck: No stridor. Trachea Midline. FROM.  No C-spine tenderness Cardiovascular: Normal rate, regular rhythm. Grossly normal heart sounds.  Good peripheral circulation. No chest wall  tenderness Respiratory: Normal respiratory effort.  No retractions. Lungs CTAB. Gastrointestinal: Soft and nontender. No distention. No abdominal bruits.  Musculoskeletal:   RUE: No point tenderness, deformity or other signs of injury. Radial pulse intact. Neuro intact. Full ROM in joint. LUE: No point tenderness, deformity or other signs of injury. Radial pulse intact. Neuro intact. Full ROM in joints RLE: Tenderness to the right hip with limited range of motion secondary to pain, neurovascular intact  LLE: No point tenderness, deformity or other signs of injury. DP pulse intact. Neuro intact. Full ROM in joints. Neurologic:  Normal speech and language. No gross focal neurologic deficits are appreciated.  Skin:  Skin is warm, dry and intact. No rash noted. Psychiatric: Mood and affect are normal. Speech and behavior are normal. GU: Deferred   ____________________________________________   LABS (all labs ordered are listed, but only abnormal results are displayed)  Labs Reviewed  CBC  BASIC METABOLIC PANEL  TYPE AND SCREEN   ____________________________________________   ED ECG REPORT I, Vanessa [place], the attending physician, personally viewed and interpreted this ECG.  Normal sinus rate of 90, no ST elevation, no T wave inversion, normal intervals with PAC ____________________________________________  RADIOLOGY I, Vanessa [place], personally viewed and evaluated these images (plain radiographs) as part of my medical decision making, as well as reviewing the written report by the radiologist.  ED MD interpretation: Fracture noted on the right hip  Official radiology report(s): DG Chest 1 View  Result Date: 11/01/2020 CLINICAL DATA:  Fall. EXAM: CHEST  1 VIEW COMPARISON:  April 20, 2017 FINDINGS: The heart size and mediastinal contours are within normal limits. Both lungs are clear. The visualized skeletal structures are unremarkable. IMPRESSION: No active disease.  Electronically Signed   By: Dorise Bullion III M.D   On: 11/01/2020 13:35   DG Hip Unilat  With Pelvis 2-3 Views Right  Result Date: 11/01/2020 CLINICAL DATA:  Fall.  Pain. EXAM: DG HIP (WITH OR WITHOUT PELVIS) 2-3V RIGHT COMPARISON:  None. FINDINGS: There is a comminuted intertrochanteric right hip fracture with displacement of fracture fragments. No dislocation. No other abnormalities. IMPRESSION: Comminuted displaced right hip intertrochanteric fracture. Electronically Signed   By: Dorise Bullion III M.D   On: 11/01/2020 13:33    ____________________________________________   PROCEDURES  Procedure(s) performed (including Critical Care):  .1-3 Lead EKG Interpretation  Date/Time: 11/01/2020 1:27 PM Performed by: Vanessa Rogers, MD Authorized by: Vanessa Townsend, MD     Interpretation: normal     ECG rate:  90s   ECG rate assessment: normal     Rhythm: sinus rhythm     Ectopy: none     Conduction: normal     ____________________________________________   INITIAL IMPRESSION / ASSESSMENT AND PLAN / ED COURSE    MARGO LAMA was evaluated in Emergency Department on 11/01/2020 for the symptoms described in the history of present illness. She was evaluated in the context of the global COVID-19 pandemic, which necessitated consideration that the patient might be at risk for infection with the SARS-CoV-2 virus that causes COVID-19. Institutional protocols and algorithms that pertain to the evaluation of patients at risk for COVID-19 are in a state of rapid change based on information released by regulatory bodies including the CDC and federal and state organizations. These policies and algorithms were followed during the patient's care in the ED.    Patient is a 87 year old who comes in for right hip pain after a mechanical fall.  Patient not hit her head did not lose consciousness.  Patient is on Plavix so discussed CT imaging with patient but she is adamant that she did not hit her  head and she is alert and oriented x3 does not seem to have any dementia and remembers the event completely therefore we will hold off on CT imaging given low suspicion for intracranial hemorrhage.  Will get x-ray to evaluate for fracture, dislocation.  She is no cervical spine tenderness to suggest cervical fracture.  No other extremity pain to suggest other fractures we will keep patient on the cardiac monitor to evaluate for arrhythmia and get basic preop labs.  We will give some IV fentanyl and Zofran to help with pain   X-ray consistent with fracture of the hip.  Will discuss with Dr. Roland Rack admit to the hospital team  ____________________________________________   FINAL CLINICAL IMPRESSION(S) / ED DIAGNOSES   Final diagnoses:  Fall, initial encounter  Closed fracture of right hip, initial encounter ([place])      MEDICATIONS GIVEN DURING THIS VISIT:  Medications  fentaNYL (SUBLIMAZE) injection 50 mcg (has no administration in time range)  ondansetron (ZOFRAN) injection 4 mg (has no administration in time range)     ED Discharge Orders     None        Note:  This document was prepared using Dragon voice recognition software and may include unintentional dictation errors.    Vanessa [place], MD 11/01/20 1344

## 2020-11-01 NOTE — Progress Notes (Signed)
Initial Nutrition Assessment  DOCUMENTATION CODES:   Not applicable  INTERVENTION:   Ensure Enlive po BID, each supplement provides 350 kcal and 20 grams of protein  MVI po daily   NUTRITION DIAGNOSIS:   Increased nutrient needs related to hip fracture as evidenced by estimated needs.  GOAL:   Patient will meet greater than or equal to 90% of their needs  MONITOR:   PO intake, Supplement acceptance, Labs, Weight trends, Skin, I & O's  REASON FOR ASSESSMENT:   Consult Hip fracture protocol  ASSESSMENT:   86 y/o female with h/o HLD, DM, TIA, gout and breast cancer who is admitted with hip fracture after fall.  Unable to see patient as pt remains in the ED. Pt with increased estimated needs secondary to hip fracture. Pt currently NPO. Per chart, plan is for surgery tomorrow. RD will add supplements and MVI to help pt meet her estimated needs. Per chart, pt with weight gain pta. RD will obtain nutrition related history and exam at follow-up. Pt is at high risk for malnutrition.   Medications reviewed and include: celexa, synthroid, ocuvite, protonix, D3  Labs reviewed: wbc- 11.2(H)  NUTRITION - FOCUSED PHYSICAL EXAM: Unable to perform at this time   Diet Order:   Diet Order             Diet NPO time specified  Diet effective now                  EDUCATION NEEDS:   Not appropriate for education at this time  Skin:   not assessed   Last BM:  pta  Height:   Ht Readings from Last 1 Encounters:  10/01/20 5\' 3"  (1.6 m)    Weight:   Wt Readings from Last 1 Encounters:  10/01/20 55.3 kg    Ideal Body Weight:  52.3 kg  Estimated Nutritional Needs:   Kcal:  1400-1600kcal/day  Protein:  70-80g/day  Fluid:  1.4-1.6L/day  Koleen Distance MS, RD, LDN Please refer to [facility] for RD and/or RD on-call/weekend/after hours pager

## 2020-11-01 NOTE — Anesthesia Preprocedure Evaluation (Addendum)
Anesthesia Evaluation  Patient identified by MRN, date of birth, ID band Patient awake    Reviewed: Allergy & Precautions, NPO status , Patient's Chart, lab work & pertinent test results  Airway Mallampati: III  TM Distance: <3 FB Neck ROM: Full    Dental no notable dental hx. (+) Teeth Intact   Pulmonary neg pulmonary ROS,    Pulmonary exam normal breath sounds clear to auscultation       Cardiovascular Exercise Tolerance: Poor (-) hypertension+ DOE  (-) CAD, (-) Past MI and (-) Cardiac Stents Normal cardiovascular exam(-) dysrhythmias  Rhythm:Regular Rate:Normal     Neuro/Psych  Headaches, TIACVA negative psych ROS   GI/Hepatic Neg liver ROS, GERD  Poorly Controlled and Medicated,  Endo/Other  diabetes, Well Controlled, Type 2Hypothyroidism   Renal/GU negative Renal ROS  negative genitourinary   Musculoskeletal  (+) Arthritis ,   Abdominal Normal abdominal exam  (+)   Peds negative pediatric ROS (+)  Hematology negative hematology ROS (+)   Anesthesia Other Findings Past Medical History: No date: Breast cancer ([facility])     Comment:  left breast No date: Chronic constipation No date: Chronic headaches No date: Diabetes mellitus No date: Fibrocystic breast No date: Gastritis No date: Gout     Comment:  injection in the past No date: Hyperlipidemia No date: Melanoma in situ of left upper extremity ([facility]) No date: Pelvic fracture ([place]) 08/2008: TIA (transient ischemic attack)     Comment:  neg MRI/MRA and CT and carotid dopplers No date: Venous insufficiency   Reproductive/Obstetrics negative OB ROS                           Anesthesia Physical Anesthesia Plan  ASA: 3 and emergent  Anesthesia Plan: General   Post-op Pain Management:    Induction: Intravenous  PONV Risk Score and Plan:   Airway Management Planned: Oral ETT  Additional Equipment:   Intra-op Plan:    Post-operative Plan:   Informed Consent: I have reviewed the patients History and Physical, chart, labs and discussed the procedure including the risks, benefits and alternatives for the proposed anesthesia with the patient or authorized representative who has indicated his/her understanding and acceptance.     Dental Advisory Given  Plan Discussed with: Anesthesiologist, CRNA and Surgeon  Anesthesia Plan Comments: (Patient reports no bleeding problems and no anticoagulant use.  Plan for GA because pt took plavix recently  Patient consented for risks of anesthesia including but not limited to:  - adverse reactions to medications - damage to eyes, teeth, lips or other oral mucosa - nerve damage due to positioning  - risk of bleeding, infection and or nerve damage from spinal that could lead to paralysis - risk of headache or failed spinal - damage to teeth, lips or other oral mucosa - sore throat or hoarseness - damage to heart, brain, nerves, lungs, other parts of body or loss of life  Patient voiced understanding.)       Anesthesia Quick Evaluation

## 2020-11-01 NOTE — H&P (Signed)
History and Physical    Lauren [name] CHE:[id number] DOB: 1928/02/13 DOA: 11/01/2020  PCP: Pcp, No   Patient coming from: [facility]  I have personally briefly reviewed patient's old medical records in [place]  Chief Complaint: Right hip pain  HPI: Lauren [name] is a 87 y.o. female with medical history significant for dyslipidemia, TIA, diabetes mellitus and left breast cancer who was brought into the emergency room by EMS after she sustained a mechanical fall landing on her right hip.  Patient ambulates with a rolling walker and had gone outside.  She states that her legs gave way and she fell landing on her right hip.  She denied feeling dizzy or lightheaded and did not lose consciousness.  She did not hit her head when she fell. She denies having any chest pain, no shortness of breath, no nausea, no vomiting, no palpitations, no diaphoresis, no lower extremity swelling, no changes in her bowel habits, no fever, no chills, no urinary symptoms. Labs show sodium 136, potassium 4.1, chloride 104, bicarb 23, glucose 121 BUN 12, creatinine 0.69, calcium 9.1, white count 11.2, hemoglobin 11.3, hematocrit 33.5, MCV 88.6, RDW 13.9, Platelet count 210 Respiratory viral panel is negative Right hip x-ray shows comminuted displaced right hip intertrochanteric fracture. Chest x-ray reviewed by me shows no acute cardiopulmonary disease. Twelve-lead EKG shows sinus rhythm with PACs.   ED Course: Patient is a 87 year old female who resides in an assisted living facility was brought into the ER by EMS after she fell landing on her right hip. She has a right hip intertrochanteric fracture and will be admitted to the hospital for further evaluation.    Review of Systems: As per HPI otherwise all other systems reviewed and negative.    Past Medical History:  Diagnosis Date   Breast cancer ([place])    left breast   Chronic constipation    Chronic headaches    Diabetes mellitus     Fibrocystic breast    Gastritis    Gout    injection in the past   Hyperlipidemia    Melanoma in situ of left upper extremity ([place])    Pelvic fracture ([facility])    TIA (transient ischemic attack) 08/2008   neg MRI/MRA and CT and carotid dopplers   Venous insufficiency     Past Surgical History:  Procedure Laterality Date   APPENDECTOMY     ARM SKIN LESION BIOPSY / EXCISION Right 11/02/2016   BREAST BIOPSY Left 10/18/2007   BREAST CYST ASPIRATION     BREAST EXCISIONAL BIOPSY Right    x2   BREAST LUMPECTOMY  09/2007   with sentinel LN biopsy   BREAST LUMPECTOMY     left   EYE SURGERY     retinal, then cataract     reports that she has never smoked. She has never used smokeless tobacco. She reports that she does not drink alcohol and does not use drugs.  Allergies  Allergen Reactions   Sulfonamide Derivatives Itching   Aspirin Other (See Comments)    Burning and pain in stomach    Family History  Problem Relation Age of Onset   Breast cancer Sister    Stroke Sister    Cancer Sister        breast   Breast cancer Sister    Stroke Sister    Cancer Sister        breast   Stroke Mother    Stroke Father    Stroke Brother  Cancer Brother        prostate   Stroke Brother    Stroke Sister    CAD Sister       Prior to Admission medications   Medication Sig Start Date End Date Taking? Authorizing Provider  Cholecalciferol (VITAMIN D3) 1.25 MG (50000 UT) CAPS Take 50,000 Units by mouth every 30 (thirty) days.   Yes [provider]  citalopram (CELEXA) 10 MG tablet Take 0.5 tablets (5 mg total) by mouth daily. 11/22/18  Yes Tower, Wynelle Fanny, MD  clopidogrel (PLAVIX) 75 MG tablet TAKE ONE TABLET BY MOUTH EVERY MORNING WITH BREAKFAST. Patient taking differently: Take 75 mg by mouth daily. 11/22/18  Yes Tower, Wynelle Fanny, MD  dicyclomine (BENTYL) 10 MG capsule Take 10 mg by mouth 2 (two) times daily.   Yes [provider]  docusate sodium (COLACE) 100 MG capsule  Take 100 mg by mouth daily.   Yes [provider]  fluticasone (FLONASE) 50 MCG/ACT nasal spray Place 1 spray into both nostrils daily.    Yes [provider]  gabapentin (NEURONTIN) 100 MG capsule Take 100 mg by mouth at bedtime. 10/31/20  Yes [provider]  levothyroxine (SYNTHROID) 50 MCG tablet Take 1 tablet (50 mcg total) by mouth daily before breakfast. 30 minutes before other medicines or supplements 11/22/18  Yes Tower, Wynelle Fanny, MD  metFORMIN (GLUCOPHAGE) 500 MG tablet Take 0.5 tablets (250 mg total) by mouth 2 (two) times daily with a meal. 11/22/18  Yes Tower, Wynelle Fanny, MD  Multiple Vitamins-Minerals (PRESERVISION AREDS 2 PO) Take 1 tablet by mouth 2 (two) times daily.   Yes [provider]  oxybutynin (DITROPAN-XL) 5 MG 24 hr tablet Take 5 mg by mouth daily. 10/31/20  Yes [provider]  pantoprazole (PROTONIX) 40 MG tablet Take 1 tablet (40 mg total) by mouth daily. 11/22/18  Yes Tower, Marne A, MD  polyethylene glycol powder (GLYCOLAX/MIRALAX) powder DISSOLVE ONE (1) CAPFUL (17GM) INTO 4 TO8 OUNCES OF FLUID AND TAKE BYMOUTH ONCE DAILY AS DIRECTED Patient taking differently: Take 17 g by mouth daily. DISSOLVE ONE (1) CAPFUL (17GM) INTO 4 TO8 OUNCES OF FLUID AND TAKE BYMOUTH ONCE DAILY AS DIRECTED 03/23/17  Yes Tower, Wynelle Fanny, MD  polyvinyl alcohol (LIQUIFILM TEARS) 1.4 % ophthalmic solution Place 1 drop into both eyes 2 (two) times daily.   Yes [provider]  Probiotic Product (ALIGN) 4 MG CAPS Take 4 mg by mouth daily.    Yes [provider]  simvastatin (ZOCOR) 20 MG tablet Take 1 tablet (20 mg total) by mouth at bedtime. 11/22/18  Yes Tower, Wynelle Fanny, MD  sulfamethoxazole-trimethoprim (BACTRIM DS) 800-160 MG tablet Take 1 tablet by mouth 2 (two) times daily. 10/31/20  Yes [provider]  vitamin B-12 (CYANOCOBALAMIN) 500 MCG tablet Take 500 mcg by mouth daily.   Yes [provider]  acetaminophen (TYLENOL) 325 MG  tablet Take 2 tablets (650 mg total) by mouth every 6 (six) hours as needed for mild pain (or Fever >/= 101). 03/02/16   Laqueta Linden, MD  alum & mag hydroxide-simeth (MAALOX/MYLANTA) 200-200-20 MG/5ML suspension Take 30 mLs by mouth every 6 (six) hours as needed (gas, abdominal pain).    [provider]  Blood Glucose Monitoring Suppl (FREESTYLE LITE) DEVI USE AS DIRECTED TO CHECK BLOOD SUGAR ONCE A DAY AND AS NEEDED (DX. E11.9) 06/16/18   Tower, Marne A, MD  glucose blood (FREESTYLE LITE) test strip USE AS DIRECTED TO CHECK BLOOD  SUGAR ONCE A DAY AND AS NEEDED (DX. E11.9) 06/16/18   Tower, Wynelle Fanny, MD  Lancets (FREESTYLE) lancets USE AS DIRECTED TO CHECK BLOOD SUGAR ONCE A DAY AND AS NEEDED (DX. E11.9) 06/16/18   Tower, Wynelle Fanny, MD  omeprazole (PRILOSEC) 20 MG capsule Take 20 mg by mouth daily. Patient not taking: No sig reported    [provider]  sennosides-docusate sodium (SENOKOT-S) 8.6-50 MG tablet Take 1 tablet by mouth daily as needed for constipation.    [provider]    Physical Exam: Vitals:   11/01/20 1435 11/01/20 1500 11/01/20 1515 11/01/20 1530  BP: (!) 162/66 (!) 176/69  (!) 166/93  Pulse: (!) 56  79 (!) 42  Resp: 16  (!) 23 (!) 21  Temp:      SpO2: 95%  95% 97%     Vitals:   11/01/20 1435 11/01/20 1500 11/01/20 1515 11/01/20 1530  BP: (!) 162/66 (!) 176/69  (!) 166/93  Pulse: (!) 56  79 (!) 42  Resp: 16  (!) 23 (!) 21  Temp:      SpO2: 95%  95% 97%      Constitutional: Alert and oriented x 3 . Not in any apparent distress.  Slow to respond but appropriate HEENT:      Head: Normocephalic and atraumatic.         Eyes: PERLA, EOMI, Conjunctivae are normal. Sclera is non-icteric.       Mouth/Throat: Mucous membranes are dry.       Neck: Supple with no signs of meningismus. Cardiovascular: Regular rate and rhythm. No murmurs, gallops, or rubs. 2+ symmetrical distal pulses are present . No JVD. No LE edema Respiratory: Respiratory  effort normal .Lungs sounds clear bilaterally. No wheezes, crackles, or rhonchi.  Gastrointestinal: Soft, non tender, and non distended with positive bowel sounds.  Genitourinary: No CVA tenderness. Musculoskeletal: Decreased range of motion right hip, externally rotated right lower extremity with shortening. No cyanosis, or erythema of extremities. Neurologic:  Face is symmetric. Moving all extremities. No gross focal neurologic deficits . Skin: Skin is warm, dry.  No rash or ulcers Psychiatric: Mood and affect are normal    Labs on Admission: I have personally reviewed following labs and imaging studies  CBC: Recent Labs  Lab 11/01/20 1240  WBC 11.2*  HGB 11.3*  HCT 33.5*  MCV 88.6  PLT 710   Basic Metabolic Panel: Recent Labs  Lab 11/01/20 1240  NA 136  K 4.1  CL 104  CO2 23  GLUCOSE 120*  BUN 12  CREATININE 0.69  CALCIUM 9.1   GFR: CrCl cannot be calculated (Unknown ideal weight.). Liver Function Tests: No results for input(s): AST, ALT, ALKPHOS, BILITOT, PROT, ALBUMIN in the last 168 hours. No results for input(s): LIPASE, AMYLASE in the last 168 hours. No results for input(s): AMMONIA in the last 168 hours. Coagulation Profile: No results for input(s): INR, PROTIME in the last 168 hours. Cardiac Enzymes: No results for input(s): CKTOTAL, CKMB, CKMBINDEX, TROPONINI in the last 168 hours. BNP (last 3 results) No results for input(s): PROBNP in the last 8760 hours. HbA1C: No results for input(s): HGBA1C in the last 72 hours. CBG: No results for input(s): GLUCAP in the last 168 hours. Lipid Profile: No results for input(s): CHOL, HDL, LDLCALC, TRIG, CHOLHDL, LDLDIRECT in the last 72 hours. Thyroid Function Tests: No results for input(s): TSH, T4TOTAL, FREET4, T3FREE, THYROIDAB in the last 72 hours. Anemia Panel: No results for input(s): VITAMINB12, FOLATE, FERRITIN,  TIBC, IRON, RETICCTPCT in the last 72 hours. Urine analysis:    Component Value Date/Time    COLORURINE YELLOW 04/16/2019 1314   APPEARANCEUR CLEAR 04/16/2019 1314   LABSPEC 1.011 04/16/2019 1314   PHURINE 7.0 04/16/2019 1314   GLUCOSEU NEGATIVE 04/16/2019 1314   HGBUR NEGATIVE 04/16/2019 1314   BILIRUBINUR NEGATIVE 04/16/2019 1314   BILIRUBINUR negative 06/14/2018 [place] 04/16/2019 1314   PROTEINUR NEGATIVE 04/16/2019 1314   UROBILINOGEN 0.2 06/14/2018 1507   UROBILINOGEN 0.2 01/12/2013 0132   NITRITE POSITIVE (A) 04/16/2019 1314   LEUKOCYTESUR SMALL (A) 04/16/2019 1314    Radiological Exams on Admission: DG Chest 1 View  Result Date: 11/01/2020 CLINICAL DATA:  Fall. EXAM: CHEST  1 VIEW COMPARISON:  April 20, 2017 FINDINGS: The heart size and mediastinal contours are within normal limits. Both lungs are clear. The visualized skeletal structures are unremarkable. IMPRESSION: No active disease. Electronically Signed   By: Dorise Bullion III M.D   On: 11/01/2020 13:35   DG Hip Unilat  With Pelvis 2-3 Views Right  Result Date: 11/01/2020 CLINICAL DATA:  Fall.  Pain. EXAM: DG HIP (WITH OR WITHOUT PELVIS) 2-3V RIGHT COMPARISON:  None. FINDINGS: There is a comminuted intertrochanteric right hip fracture with displacement of fracture fragments. No dislocation. No other abnormalities. IMPRESSION: Comminuted displaced right hip intertrochanteric fracture. Electronically Signed   By: Dorise Bullion III M.D   On: 11/01/2020 13:33     Assessment/Plan Principal Problem:   Closed intertrochanteric fracture of right hip ([place]) Active Problems:   Diabetes mellitus ([place])   GERD (gastroesophageal reflux disease)   HTN (hypertension)   Hypothyroid   Hemiparesis due to recent stroke ([place])     Closed intertrochanteric fracture of the right hip Status post fall Immobilize right lower extremity Pain control Muscle relaxants We will consult orthopedic surgery     Diabetes mellitus Patient is n.p.o. for planned surgery Blood sugar checks every 4 hours Hold  metformin    History of CVA with hemiparesis Hold Plavix for planned surgery Continue statin    Depression Continue citalopram    Hypothyroidism Continue Synthroid  DVT prophylaxis: SCD Code Status: DO NOT RESUSCITATE Family Communication: Greater than 50% of time was spent discussing patient's condition and plan of care with her niece at the bedside.  All questions and concerns have been addressed.  She verbalizes understanding and agrees with the plan.  CODE STATUS was discussed and she is a DNR. Disposition Plan: Skilled nursing facility Consults called: Orthopedic surgery Status: At the time of admission, it appears that the appropriate admission status for this patient is inpatient. This is judged to be reasonable and necessary in order to provide the required intensity of service to ensure the patient's safety given the presenting symptoms, physical exam findings, and initial radiographic and laboratory data in the context of their comorbid conditions. Patient requires inpatient status due to high intensity of service, high risk for further deterioration and high frequency of surveillance required.    Collier Bullock MD [facility]     11/01/2020, 3:43 PM

## 2020-11-01 NOTE — ED Triage Notes (Signed)
Pt comes via EMs from [facility] with c/o fall. Pt had unwitnessed fall. Pt had been outside unattended for about minutes.  Pt has pain and deformity to right hip. Pt has nausea.  Pt states she was outside with her walker and just gave out and fell. No blood thinners. No LOC or hitting head.

## 2020-11-01 NOTE — ED Notes (Signed)
Surgical consent witnessed w/ sugeon and placed in pt chart.

## 2020-11-02 ENCOUNTER — Encounter
Admission: EM | Disposition: A | Discharge status: Skilled Nursing Facility | Payer: Self-pay | Source: Home / Self Care | Attending: Internal Medicine

## 2020-11-02 ENCOUNTER — Encounter: Payer: Self-pay | Admitting: Internal Medicine

## 2020-11-02 ENCOUNTER — Inpatient Hospital Stay: Payer: Medicare Other | Admitting: Anesthesiology

## 2020-11-02 ENCOUNTER — Inpatient Hospital Stay: Payer: Medicare Other

## 2020-11-02 DIAGNOSIS — Z8673 Personal history of transient ischemic attack (TIA), and cerebral infarction without residual deficits: Secondary | ICD-10-CM

## 2020-11-02 DIAGNOSIS — F32A Depression, unspecified: Secondary | ICD-10-CM

## 2020-11-02 DIAGNOSIS — R5383 Other fatigue: Secondary | ICD-10-CM

## 2020-11-02 DIAGNOSIS — S72001S Fracture of unspecified part of neck of right femur, sequela: Secondary | ICD-10-CM

## 2020-11-02 DIAGNOSIS — R062 Wheezing: Secondary | ICD-10-CM

## 2020-11-02 HISTORY — PX: INTRAMEDULLARY (IM) NAIL INTERTROCHANTERIC: SHX5875

## 2020-11-02 LAB — BASIC METABOLIC PANEL
Anion gap: 5 (ref 5–15)
BUN: 9 mg/dL (ref 8–23)
CO2: 28 mmol/L (ref 22–32)
Calcium: 8.9 mg/dL (ref 8.9–10.3)
Chloride: 100 mmol/L (ref 98–111)
Creatinine, Ser: 0.64 mg/dL (ref 0.44–1.00)
GFR, Estimated: >60 mL/min (ref >60)
Glucose, Bld: 143 mg/dL — ABNORMAL HIGH (ref 70–99)
Potassium: 4.3 mmol/L (ref 3.5–5.1)
Sodium: 133 mmol/L — ABNORMAL LOW (ref 135–145)

## 2020-11-02 LAB — GLUCOSE, CAPILLARY
Glucose-Capillary: 137 mg/dL — ABNORMAL HIGH (ref 70–99)
Glucose-Capillary: 148 mg/dL — ABNORMAL HIGH (ref 70–99)
Glucose-Capillary: 149 mg/dL — ABNORMAL HIGH (ref 70–99)
Glucose-Capillary: 158 mg/dL — ABNORMAL HIGH (ref 70–99)
Glucose-Capillary: 181 mg/dL — ABNORMAL HIGH (ref 70–99)
Glucose-Capillary: 191 mg/dL — ABNORMAL HIGH (ref 70–99)
Glucose-Capillary: 226 mg/dL — ABNORMAL HIGH (ref 70–99)

## 2020-11-02 LAB — CBC
HCT: 32.2 % — ABNORMAL LOW (ref 36.0–46.0)
Hemoglobin: 10.9 g/dL — ABNORMAL LOW (ref 12.0–15.0)
MCH: 29.7 pg (ref 26.0–34.0)
MCHC: 33.9 g/dL (ref 30.0–36.0)
MCV: 87.7 fL (ref 80.0–100.0)
Platelets: 194 10*3/uL (ref 150–400)
RBC: 3.67 MIL/uL — ABNORMAL LOW (ref 3.87–5.11)
RDW: 13.9 % (ref 11.5–15.5)
WBC: 14.6 10*3/uL — ABNORMAL HIGH (ref 4.0–10.5)
nRBC: 0 % (ref 0.0–0.2)

## 2020-11-02 LAB — SURGICAL PCR SCREEN
MRSA, PCR: POSITIVE — AB
Staphylococcus aureus: POSITIVE — AB

## 2020-11-02 SURGERY — FIXATION, FRACTURE, INTERTROCHANTERIC, WITH INTRAMEDULLARY ROD
Anesthesia: General | Laterality: Right

## 2020-11-02 MED ORDER — IPRATROPIUM-ALBUTEROL 0.5-2.5 (3) MG/3ML IN SOLN
3.0000 mL | Freq: Four times a day (QID) | RESPIRATORY_TRACT | Status: DC
  Administered 2020-11-02 – 2020-11-03 (×3): 3 mL via RESPIRATORY_TRACT
  Filled 2020-11-02 (×3): qty 3

## 2020-11-02 MED ORDER — METOCLOPRAMIDE HCL 10 MG PO TABS: 5.0000 mg | ORAL_TABLET | Freq: Three times a day (TID) | ORAL | Status: DC | PRN

## 2020-11-02 MED ORDER — SODIUM CHLORIDE 0.9 % IV SOLN
INTRAVENOUS | Status: DC | PRN
  Administered 2020-11-02: 250 mL via INTRAVENOUS

## 2020-11-02 MED ORDER — FENTANYL CITRATE (PF) 100 MCG/2ML IJ SOLN
INTRAMUSCULAR | Status: DC | PRN
  Administered 2020-11-02: 100 ug via INTRAVENOUS

## 2020-11-02 MED ORDER — BUPIVACAINE-EPINEPHRINE (PF) 0.5% -1:200000 IJ SOLN
INTRAMUSCULAR | Status: DC | PRN
  Administered 2020-11-02: 30 mL via PERINEURAL

## 2020-11-02 MED ORDER — MAGNESIUM HYDROXIDE 400 MG/5ML PO SUSP: 30.0000 mL | Freq: Every day | ORAL | Status: DC | PRN

## 2020-11-02 MED ORDER — MORPHINE SULFATE (PF) 2 MG/ML IV SOLN: 0.5000 mg | INTRAVENOUS | Status: DC | PRN

## 2020-11-02 MED ORDER — SUGAMMADEX SODIUM 200 MG/2ML IV SOLN
INTRAVENOUS | Status: DC | PRN
  Administered 2020-11-02: 200 mg via INTRAVENOUS

## 2020-11-02 MED ORDER — ONDANSETRON HCL 4 MG/2ML IJ SOLN: 4.0000 mg | Freq: Once | INTRAMUSCULAR | Status: DC | PRN

## 2020-11-02 MED ORDER — TRAMADOL HCL 50 MG PO TABS
50.0000 mg | ORAL_TABLET | Freq: Four times a day (QID) | ORAL | Status: DC | PRN
  Administered 2020-11-02 – 2020-11-03 (×2): 50 mg via ORAL
  Filled 2020-11-02 (×2): qty 1

## 2020-11-02 MED ORDER — LACTATED RINGERS IV SOLN: INTRAVENOUS | Status: DC | PRN

## 2020-11-02 MED ORDER — FENTANYL CITRATE (PF) 100 MCG/2ML IJ SOLN
25.0000 ug | INTRAMUSCULAR | Status: DC | PRN
  Administered 2020-11-02: 25 ug via INTRAVENOUS

## 2020-11-02 MED ORDER — DIPHENHYDRAMINE HCL 12.5 MG/5ML PO ELIX: 12.5000 mg | ORAL_SOLUTION | ORAL | Status: DC | PRN

## 2020-11-02 MED ORDER — METOCLOPRAMIDE HCL 5 MG/ML IJ SOLN: 5.0000 mg | Freq: Three times a day (TID) | INTRAMUSCULAR | Status: DC | PRN

## 2020-11-02 MED ORDER — FUROSEMIDE 10 MG/ML IJ SOLN
40.0000 mg | Freq: Once | INTRAMUSCULAR | Status: AC
  Administered 2020-11-02: 40 mg via INTRAVENOUS
  Filled 2020-11-02: qty 4

## 2020-11-02 MED ORDER — BISACODYL 10 MG RE SUPP: 10.0000 mg | Freq: Every day | RECTAL | Status: DC | PRN

## 2020-11-02 MED ORDER — ACETAMINOPHEN 500 MG PO TABS
500.0000 mg | ORAL_TABLET | Freq: Four times a day (QID) | ORAL | Status: AC
  Administered 2020-11-02 – 2020-11-03 (×3): 500 mg via ORAL
  Filled 2020-11-02 (×2): qty 1

## 2020-11-02 MED ORDER — FENTANYL CITRATE (PF) 100 MCG/2ML IJ SOLN
INTRAMUSCULAR | Status: AC
  Filled 2020-11-02: qty 2

## 2020-11-02 MED ORDER — DEXAMETHASONE SODIUM PHOSPHATE 10 MG/ML IJ SOLN
INTRAMUSCULAR | Status: DC | PRN
  Administered 2020-11-02: 4 mg via INTRAVENOUS

## 2020-11-02 MED ORDER — DOCUSATE SODIUM 100 MG PO CAPS
100.0000 mg | ORAL_CAPSULE | Freq: Two times a day (BID) | ORAL | Status: DC
  Administered 2020-11-02 – 2020-11-08 (×9): 100 mg via ORAL
  Filled 2020-11-02 (×9): qty 1

## 2020-11-02 MED ORDER — MUPIROCIN 2 % EX OINT
1.0000 "application " | TOPICAL_OINTMENT | Freq: Two times a day (BID) | CUTANEOUS | Status: AC
  Administered 2020-11-02 – 2020-11-06 (×8): 1 via NASAL
  Filled 2020-11-02: qty 22

## 2020-11-02 MED ORDER — DEXMEDETOMIDINE HCL 200 MCG/2ML IV SOLN
INTRAVENOUS | Status: DC | PRN
  Administered 2020-11-02: 4 ug via INTRAVENOUS
  Administered 2020-11-02: 10 ug via INTRAVENOUS

## 2020-11-02 MED ORDER — FENTANYL CITRATE (PF) 100 MCG/2ML IJ SOLN
INTRAMUSCULAR | Status: AC
  Administered 2020-11-02: 25 ug via INTRAVENOUS
  Filled 2020-11-02: qty 2

## 2020-11-02 MED ORDER — SODIUM CHLORIDE 0.9 % IV SOLN: INTRAVENOUS | Status: DC

## 2020-11-02 MED ORDER — ONDANSETRON HCL 4 MG PO TABS: 4.0000 mg | ORAL_TABLET | Freq: Four times a day (QID) | ORAL | Status: DC | PRN

## 2020-11-02 MED ORDER — ENOXAPARIN SODIUM 40 MG/0.4ML IJ SOSY
40.0000 mg | PREFILLED_SYRINGE | INTRAMUSCULAR | Status: DC
  Administered 2020-11-03 – 2020-11-05 (×3): 40 mg via SUBCUTANEOUS
  Filled 2020-11-02 (×3): qty 0.4

## 2020-11-02 MED ORDER — FLEET ENEMA 7-19 GM/118ML RE ENEM: 1.0000 | ENEMA | Freq: Once | RECTAL | Status: DC | PRN

## 2020-11-02 MED ORDER — CYANOCOBALAMIN 500 MCG PO TABS
500.0000 ug | ORAL_TABLET | Freq: Every day | ORAL | Status: DC
  Administered 2020-11-03 – 2020-11-08 (×6): 500 ug via ORAL
  Filled 2020-11-02 (×7): qty 1

## 2020-11-02 MED ORDER — ACETAMINOPHEN 325 MG PO TABS
325.0000 mg | ORAL_TABLET | Freq: Four times a day (QID) | ORAL | Status: DC | PRN
  Administered 2020-11-03: 650 mg via ORAL
  Filled 2020-11-02: qty 2

## 2020-11-02 MED ORDER — DEXMEDETOMIDINE (PRECEDEX) IN NS 20 MCG/5ML (4 MCG/ML) IV SYRINGE
PREFILLED_SYRINGE | INTRAVENOUS | Status: AC
  Filled 2020-11-02: qty 5

## 2020-11-02 MED ORDER — PHENYLEPHRINE HCL (PRESSORS) 10 MG/ML IV SOLN
INTRAVENOUS | Status: DC | PRN
  Administered 2020-11-02 (×4): 100 ug via INTRAVENOUS

## 2020-11-02 MED ORDER — ONDANSETRON HCL 4 MG/2ML IJ SOLN
4.0000 mg | Freq: Four times a day (QID) | INTRAMUSCULAR | Status: DC | PRN
  Administered 2020-11-02: 4 mg via INTRAVENOUS
  Filled 2020-11-02: qty 2

## 2020-11-02 MED ORDER — LIDOCAINE HCL (CARDIAC) PF 100 MG/5ML IV SOSY
PREFILLED_SYRINGE | INTRAVENOUS | Status: DC | PRN
  Administered 2020-11-02: 100 mg via INTRAVENOUS

## 2020-11-02 MED ORDER — PROPOFOL 10 MG/ML IV BOLUS
INTRAVENOUS | Status: AC
  Filled 2020-11-02: qty 20

## 2020-11-02 MED ORDER — LACTATED RINGERS IV SOLN: INTRAVENOUS | Status: DC

## 2020-11-02 MED ORDER — ACETAMINOPHEN 10 MG/ML IV SOLN
INTRAVENOUS | Status: DC | PRN
  Administered 2020-11-02: 1000 mg via INTRAVENOUS

## 2020-11-02 MED ORDER — PROPOFOL 10 MG/ML IV BOLUS
INTRAVENOUS | Status: DC | PRN
  Administered 2020-11-02: 120 mg via INTRAVENOUS
  Administered 2020-11-02: 25 mg via INTRAVENOUS

## 2020-11-02 MED ORDER — CEFAZOLIN SODIUM-DEXTROSE 2-4 GM/100ML-% IV SOLN
2.0000 g | Freq: Four times a day (QID) | INTRAVENOUS | Status: AC
  Administered 2020-11-02 – 2020-11-03 (×3): 2 g via INTRAVENOUS
  Filled 2020-11-02 (×3): qty 100

## 2020-11-02 MED ORDER — ACETAMINOPHEN 10 MG/ML IV SOLN: 1000.0000 mg | Freq: Once | INTRAVENOUS | Status: DC | PRN

## 2020-11-02 SURGICAL SUPPLY — 44 items
APL PRP STRL LF DISP 70% ISPRP (MISCELLANEOUS) ×2
BIT DRILL 4.3MMS DISTAL GRDTED (BIT) IMPLANT
BNDG COHESIVE 4X5 TAN STRL (GAUZE/BANDAGES/DRESSINGS) ×3 IMPLANT
BNDG COHESIVE 6X5 TAN STRL LF (GAUZE/BANDAGES/DRESSINGS) ×3 IMPLANT
CHLORAPREP W/TINT 26 (MISCELLANEOUS) ×6 IMPLANT
COVER WAND RF STERILE (DRAPES) ×3 IMPLANT
DRAPE 3/4 80X56 (DRAPES) ×3 IMPLANT
DRAPE C-ARMOR (DRAPES) ×3 IMPLANT
DRILL 4.3MMS DISTAL GRADUATED (BIT) ×3
DRSG MEPILEX SACRM 8.7X9.8 (GAUZE/BANDAGES/DRESSINGS) ×1 IMPLANT
DRSG OPSITE POSTOP 3X4 (GAUZE/BANDAGES/DRESSINGS) IMPLANT
DRSG OPSITE POSTOP 4X6 (GAUZE/BANDAGES/DRESSINGS) IMPLANT
ELECT CAUTERY BLADE 6.4 (BLADE) ×3 IMPLANT
ELECT REM PT RETURN 9FT ADLT (ELECTROSURGICAL) ×3
ELECTRODE REM PT RTRN 9FT ADLT (ELECTROSURGICAL) ×1 IMPLANT
GAUZE SPONGE 4X4 12PLY STRL (GAUZE/BANDAGES/DRESSINGS) ×3 IMPLANT
GLOVE SURG ENC MOIS LTX SZ8 (GLOVE) ×6 IMPLANT
GLOVE SURG UNDER LTX SZ8 (GLOVE) ×3 IMPLANT
GOWN STRL REUS W/ TWL LRG LVL3 (GOWN DISPOSABLE) ×1 IMPLANT
GOWN STRL REUS W/ TWL XL LVL3 (GOWN DISPOSABLE) ×1 IMPLANT
GOWN STRL REUS W/TWL LRG LVL3 (GOWN DISPOSABLE) ×3
GOWN STRL REUS W/TWL XL LVL3 (GOWN DISPOSABLE) ×3
GUIDEPIN VERSANAIL DSP 3.2X444 (ORTHOPEDIC DISPOSABLE SUPPLIES) ×2 IMPLANT
GUIDEWIRE BALL NOSE 100CM (WIRE) ×2 IMPLANT
HFN RH 130 DEG 9MM X 380MM (Nail) ×2 IMPLANT
MANIFOLD NEPTUNE II (INSTRUMENTS) ×3 IMPLANT
MAT ABSORB  FLUID 56X50 GRAY (MISCELLANEOUS) ×3
MAT ABSORB FLUID 56X50 GRAY (MISCELLANEOUS) ×1 IMPLANT
NDL FILTER BLUNT 18X1 1/2 (NEEDLE) ×1 IMPLANT
NEEDLE FILTER BLUNT 18X 1/2SAF (NEEDLE) ×2
NEEDLE FILTER BLUNT 18X1 1/2 (NEEDLE) ×1 IMPLANT
NEEDLE HYPO 22GX1.5 SAFETY (NEEDLE) ×3 IMPLANT
NS IRRIG 500ML POUR BTL (IV SOLUTION) ×3 IMPLANT
PACK HIP COMPR (MISCELLANEOUS) ×3 IMPLANT
SCREW BONE CORTICAL 5.0X42 (Screw) ×4 IMPLANT
SCREW LAG HIP NAIL 10.5X95 (Screw) ×2 IMPLANT
STAPLER SKIN PROX 35W (STAPLE) ×3 IMPLANT
STRAP SAFETY 5IN WIDE (MISCELLANEOUS) ×3 IMPLANT
SUT VIC AB 0 CT1 36 (SUTURE) ×3 IMPLANT
SUT VIC AB 1 CT1 36 (SUTURE) ×3 IMPLANT
SUT VIC AB 2-0 CT1 (SUTURE) ×6 IMPLANT
SYR 10ML LL (SYRINGE) ×3 IMPLANT
SYR 30ML LL (SYRINGE) ×3 IMPLANT
TAPE MICROFOAM 4IN (TAPE) ×3 IMPLANT

## 2020-11-02 NOTE — Anesthesia Postprocedure Evaluation (Signed)
Anesthesia Post Note  Patient: Lauren [name]  Procedure(s) Performed: INTRAMEDULLARY (IM) NAIL INTERTROCHANTRIC (Right)  Patient location during evaluation: PACU Anesthesia Type: General Level of consciousness: awake and alert Pain management: pain level controlled Vital Signs Assessment: post-procedure vital signs reviewed and stable Respiratory status: spontaneous breathing, nonlabored ventilation, respiratory function stable and patient connected to nasal cannula oxygen Cardiovascular status: blood pressure returned to baseline and stable Postop Assessment: no apparent nausea or vomiting Anesthetic complications: no   No notable events documented.   Last Vitals:  Vitals:   11/02/20 1358 11/02/20 1440  BP:  (!) 120/53  Pulse:  92  Resp:  16  Temp:  36.7 C  SpO2: 95% 96%    Last Pain:  Vitals:   11/02/20 1440  TempSrc: Axillary  PainSc:                  Tonny Bollman

## 2020-11-02 NOTE — Op Note (Signed)
11/02/2020  9:58 AM  Patient:   Lauren [name]  Pre-Op Diagnosis:   Closed comminuted displaced intertrochanteric fracture, right hip.  Post-Op Diagnosis:   Same  Procedure:   Reduction and internal fixation of displaced intertrochanteric right hip fracture with Biomet Affixis TFN nail.  Surgeon:   Pascal Lux, MD  Assistant:   None  Anesthesia:   Spinal  Findings:   As above  Complications:   None  EBL:   50 cc  Fluids:   600 cc crystalloid  UOP:   None  TT:   None  Drains:   None  Closure:   Staples  Implants:   Biomet Affixis 9 x 380 mm TFN with a 95 mm lag screw and a 42 mm distal interlocking screw  Brief Clinical Note:   The patient is a 87 year old female who sustained the above-noted injury yesterday when she fell at her assisted living facility. She was brought to the emergency room where x-rays demonstrated the above-noted fracture. The patient has been cleared medically and presents at this time for reduction and internal fixation of the displaced intertrochanteric right hip fracture.  Procedure:   The patient was brought into the operating room. After adequate spinal anesthesia was obtained, the patient was lain in the supine position on the fracture table. The uninjured leg was placed in a flexed and abducted position while the injured lower extremity was placed in longitudinal traction. The fracture was reduced using longitudinal traction and internal rotation. The adequacy of reduction was verified fluoroscopically in AP and lateral projections and found to be near anatomic. The lateral aspects of the right hip and thigh were prepped with ChloraPrep solution before being draped sterilely. Preoperative antibiotics were administered. A timeout was performed to verify the appropriate surgical site. The greater trochanter was identified fluoroscopically and an approximately 3 cm incision made about 2-3 fingerbreadths above the tip of the greater trochanter. The  incision was carried down through the subcutaneous tissues to expose the gluteal fascia. This was split the length of the incision, providing access to the tip of the trochanter. Under fluoroscopic guidance, a guidewire was drilled through the tip of the trochanter into the proximal metaphysis to the level of the lesser trochanter. After verifying its position fluoroscopically in AP and lateral projections, it was overreamed with the initial reamer to the depth of the lesser trochanter. A guidewire was passed down through the femoral canal to the supracondylar region. The adequacy of guidewire position was verified fluoroscopically in AP and lateral projections before the length of the guidewire within the canal was measured and found to be 405 mm. Therefore, a 380 mm length nail was selected. The guidewire was overreamed sequentially using the flexible reamers, beginning with a 9.5 mm reamer and progressing to a 10.5 mm reamer. This provided good cortical chatter. The 9 x 380 mm Biomet Affixis TFN rod was selected and advanced to the appropriate depth, as verified fluoroscopically.   The guide system for the lag screw was positioned and advanced through an approximately 2 cm stab incision over the lateral aspect of the proximal femur. The guidewire was drilled up through the trochanteric femoral nail and into the femoral neck to rest within 5 mm of subchondral bone. After verifying its position in the femoral neck and head in both AP and lateral projections, the guidewire was measured and found to be optimally replicated by a 95 mm lag screw. The guidewire was overreamed to the appropriate depth before the  lag screw was inserted and advanced to the appropriate depth as verified fluoroscopically in AP and lateral projections. The locking screw was advanced, then backed off a quarter turn to set the lag screw. Again the adequacy of hardware position and fracture reduction was verified fluoroscopically in AP and  lateral projections and found to be excellent.  Attention was directed distally. Using the "perfect circle" technique, the leg and fluoroscopy machine were positioned appropriately. An approximately 1.5 cm stab incision was made over the skin at the appropriate point before the drill bit was advanced through the cortex and across the static hole of the nail. The appropriate length of the screw was determined before the 42 mm distal interlocking screw was positioned, then advanced and tightened securely. Again the adequacy of screw position was verified fluoroscopically in AP and lateral projections and found to be excellent.  The wounds were irrigated thoroughly with sterile saline solution before the abductor fascia was reapproximated using #1 Vicryl interrupted sutures. The subcutaneous tissues were closed using 2-0 Vicryl interrupted sutures. The skin was closed using staples. A total of 30 cc of 0.5% Sensorcaine with epinephrine was injected in and around all incisions. Sterile occlusive dressings were applied to all wounds before the patient was transferred back to his/her hospital bed. The patient was then transferred to the recovery room in satisfactory condition after tolerating the procedure well.

## 2020-11-02 NOTE — Anesthesia Procedure Notes (Signed)
Procedure Name: Intubation Date/Time: 11/02/2020 8:40 AM Performed by: Justus Memory, CRNA Pre-anesthesia Checklist: Patient identified, Patient being monitored, Timeout performed, Emergency Drugs available and Suction available Patient Re-evaluated:Patient Re-evaluated prior to induction Oxygen Delivery Method: Circle system utilized Preoxygenation: Pre-oxygenation with 100% oxygen Induction Type: IV induction Ventilation: Mask ventilation without difficulty Laryngoscope Size: Mac, 3 and McGraph Grade View: Grade I Tube type: Oral Tube size: 6.5 mm Number of attempts: 1 Airway Equipment and Method: Stylet Placement Confirmation: ETT inserted through vocal cords under direct vision, positive ETCO2 and breath sounds checked- equal and bilateral Secured at: 21 cm Tube secured with: Tape Dental Injury: Teeth and Oropharynx as per pre-operative assessment  Difficulty Due To: Difficulty was anticipated and Difficult Airway- due to anterior larynx Future Recommendations: Recommend- induction with short-acting agent, and alternative techniques readily available

## 2020-11-02 NOTE — Transfer of Care (Signed)
Immediate Anesthesia Transfer of Care Note  Patient: Lauren [name]  Procedure(s) Performed: INTRAMEDULLARY (IM) NAIL INTERTROCHANTRIC (Right)  Patient Location: PACU  Anesthesia Type:General  Level of Consciousness: sedated  Airway & Oxygen Therapy: Patient Spontanous Breathing and Patient connected to face mask oxygen  Post-op Assessment: Report given to RN and Post -op Vital signs reviewed and stable  Post vital signs: Reviewed and stable  Last Vitals:  Vitals Value Taken Time  BP 145/62 11/02/20 1003  Temp    Pulse 70 11/02/20 1015  Resp 19 11/02/20 1015  SpO2 100 % 11/02/20 1015  Vitals shown include unvalidated device data.  Last Pain:  Vitals:   11/02/20 0750  TempSrc: Oral  PainSc:          Complications: No notable events documented.

## 2020-11-02 NOTE — Progress Notes (Signed)
Patient ID: Lauren [name], female   DOB: [birth year], 87 y.o.   MRN: [id number] [facility] PROGRESS NOTE  Lauren [name] OQH:[id number] DOB: [birth year] DOA: 11/01/2020 PCP: Pcp, No  HPI/Subjective: Patient seen postoperative this morning.  At that time she was feeling nauseous and had some secretions.  No chest pain or shortness of breath.  Objective: Vitals:   11/02/20 1100 11/02/20 1207  BP: (!) 106/47 (!) 152/66  Pulse: 70 83  Resp:  17  Temp: 99.2 F (37.3 C) 98.2 F (36.8 C)  SpO2: 95% 96%    Intake/Output Summary (Last 24 hours) at 11/02/2020 1354 Last data filed at 11/02/2020 0950 Gross per 24 hour  Intake 800 ml  Output 50 ml  Net 750 ml   There were no vitals filed for this visit.  ROS: Review of Systems  Respiratory:  Negative for shortness of breath.   Cardiovascular:  Negative for chest pain.  Gastrointestinal:  Positive for nausea. Negative for abdominal pain and vomiting.  Exam: Physical Exam HENT:     Head: Normocephalic.     Mouth/Throat:     Pharynx: No oropharyngeal exudate.  Eyes:     General: Lids are normal.     Conjunctiva/sclera: Conjunctivae normal.     Pupils: Pupils are equal, round, and reactive to light.  Cardiovascular:     Rate and Rhythm: Normal rate and regular rhythm.     Heart sounds: Normal heart sounds, S1 normal and S2 normal.  Pulmonary:     Breath sounds: Transmitted upper airway sounds present. Examination of the right-middle field reveals wheezing. Examination of the left-middle field reveals wheezing. Examination of the right-lower field reveals decreased breath sounds and wheezing. Examination of the left-lower field reveals decreased breath sounds and wheezing. Decreased breath sounds and wheezing present. No rhonchi or rales.  Abdominal:     Palpations: Abdomen is soft.     Tenderness: There is no abdominal tenderness.  Musculoskeletal:     Right ankle: No swelling.     Left ankle: No swelling.  Skin:    General:  Skin is warm.     Findings: No rash.  Neurological:     Mental Status: She is lethargic.     Data Reviewed: Basic Metabolic Panel: Recent Labs  Lab 11/01/20 1240 11/02/20 0439  NA 136 133*  K 4.1 4.3  CL 104 100  CO2 23 28  GLUCOSE 120* 143*  BUN 12 9  CREATININE 0.69 0.64  CALCIUM 9.1 8.9    CBC: Recent Labs  Lab 11/01/20 1240 11/02/20 0439  WBC 11.2* 14.6*  HGB 11.3* 10.9*  HCT 33.5* 32.2*  MCV 88.6 87.7  PLT 210 194     CBG: Recent Labs  Lab 11/02/20 0017 11/02/20 0417 11/02/20 0752 11/02/20 1031 11/02/20 1204  GLUCAP 158* 149* 148* 137* 191*    Recent Results (from the past 240 hour(s))  Resp Panel by RT-PCR (Flu A&B, Covid) Nasopharyngeal Swab     Status: None   Collection Time: 11/01/20 12:48 PM   Specimen: Nasopharyngeal Swab; Nasopharyngeal(NP) swabs in vial transport medium  Result Value Ref Range Status   SARS Coronavirus 2 by RT PCR NEGATIVE NEGATIVE Final    Comment: (NOTE) SARS-CoV-2 target nucleic acids are NOT DETECTED.  The SARS-CoV-2 RNA is generally detectable in upper respiratory specimens during the acute phase of infection. The lowest concentration of SARS-CoV-2 viral copies this assay can detect is 138 copies/mL. A negative result does not preclude SARS-Cov-2 infection and should  not be used as the sole basis for treatment or other patient management decisions. A negative result may occur with  improper specimen collection/handling, submission of specimen other than nasopharyngeal swab, presence of viral mutation(s) within the areas targeted by this assay, and inadequate number of viral copies(<138 copies/mL). A negative result must be combined with clinical observations, patient history, and epidemiological information. The expected result is Negative.  Fact Sheet for Patients:  EntrepreneurPulse.com.au  Fact Sheet for Healthcare Providers:  IncredibleEmployment.be  This test is no t  yet approved or cleared by the [place] FDA and  has been authorized for detection and/or diagnosis of SARS-CoV-2 by FDA under an Emergency Use Authorization (EUA). This EUA will remain  in effect (meaning this test can be used) for the duration of the COVID-19 declaration under Section 564(b)(1) of the Act, 21 U.S.C.section 360bbb-3(b)(1), unless the authorization is terminated  or revoked sooner.       Influenza A by PCR NEGATIVE NEGATIVE Final   Influenza B by PCR NEGATIVE NEGATIVE Final    Comment: (NOTE) The Xpert Xpress SARS-CoV-2/FLU/RSV plus assay is intended as an aid in the diagnosis of influenza from Nasopharyngeal swab specimens and should not be used as a sole basis for treatment. Nasal washings and aspirates are unacceptable for Xpert Xpress SARS-CoV-2/FLU/RSV testing.  Fact Sheet for Patients: EntrepreneurPulse.com.au  Fact Sheet for Healthcare Providers: IncredibleEmployment.be  This test is not yet approved or cleared by the [place] FDA and has been authorized for detection and/or diagnosis of SARS-CoV-2 by FDA under an Emergency Use Authorization (EUA). This EUA will remain in effect (meaning this test can be used) for the duration of the COVID-19 declaration under Section 564(b)(1) of the Act, 21 U.S.C. section 360bbb-3(b)(1), unless the authorization is terminated or revoked.  Performed at [facility], [address], [place], [place] [address]   Surgical PCR screen     Status: Abnormal   Collection Time: 11/02/20  5:53 AM   Specimen: Nasal Mucosa; Nasal Swab  Result Value Ref Range Status   MRSA, PCR POSITIVE (A) NEGATIVE Final    Comment: RESULT CALLED TO, READ BACK BY AND VERIFIED WITH: BREANNA CAMPBELL ON 11/02/20 AT 0730 QSD    Staphylococcus aureus POSITIVE (A) NEGATIVE Final    Comment: (NOTE) The Xpert SA Assay (FDA approved for NASAL specimens in patients 4 years of age and older), is  one component of a comprehensive surveillance program. It is not intended to diagnose infection nor to guide or monitor treatment. Performed at [facility], [address], [place], [place] [address]      Studies: DG Chest 1 View  Result Date: 11/01/2020 CLINICAL DATA:  Fall. EXAM: CHEST  1 VIEW COMPARISON:  April 20, 2017 FINDINGS: The heart size and mediastinal contours are within normal limits. Both lungs are clear. The visualized skeletal structures are unremarkable. IMPRESSION: No active disease. Electronically Signed   By: Dorise Bullion III M.D   On: 11/01/2020 13:35   DG HIP OPERATIVE UNILAT W OR W/O PELVIS RIGHT  Result Date: 11/02/2020 CLINICAL DATA:  Fixation of intertrochanteric fracture. EXAM: OPERATIVE right HIP (WITH PELVIS IF PERFORMED) 16 VIEWS TECHNIQUE: Fluoroscopic spot image(s) were submitted for interpretation post-operatively. COMPARISON:  Radiograph 11/01/2020 FINDINGS: Intraoperative spot films from the operating room demonstrate initial closed reduction the intertrochanteric fracture and subsequent placement of a lung intramedullary gamma nail and proximal dynamic hip screw. Single distal interlocking screw is also noted. Good position and alignment without complicating features.  IMPRESSION: Internal fixation of the intertrochanteric fracture. Electronically Signed   By: Marijo Sanes M.D.   On: 11/02/2020 11:35   DG Hip Unilat  With Pelvis 2-3 Views Right  Result Date: 11/01/2020 CLINICAL DATA:  Fall.  Pain. EXAM: DG HIP (WITH OR WITHOUT PELVIS) 2-3V RIGHT COMPARISON:  None. FINDINGS: There is a comminuted intertrochanteric right hip fracture with displacement of fracture fragments. No dislocation. No other abnormalities. IMPRESSION: Comminuted displaced right hip intertrochanteric fracture. Electronically Signed   By: Dorise Bullion III M.D   On: 11/01/2020 13:33    Scheduled Meds:  acetaminophen  500 mg Oral Q6H   citalopram  5 mg Oral Daily    dicyclomine  10 mg Oral BID   docusate sodium  100 mg Oral BID   [START ON 11/03/2020] enoxaparin (LOVENOX) injection  40 mg Subcutaneous Q24H   feeding supplement  237 mL Oral BID BM   ipratropium-albuterol  3 mL Nebulization Q6H   levothyroxine  50 mcg Oral Q0600   multivitamin with minerals  1 tablet Oral Daily   multivitamin-lutein  1 capsule Oral BID   mupirocin ointment  1 application Nasal BID   pantoprazole  40 mg Oral Daily   polyethylene glycol  17 g Oral Daily   polyvinyl alcohol  1 drop Both Eyes BID   simvastatin  20 mg Oral QHS   vitamin B-12  500 mcg Oral Daily   Vitamin D (Ergocalciferol)  50,000 Units Oral Q30 days   Continuous Infusions:   ceFAZolin (ANCEF) IV     methocarbamol (ROBAXIN) IV      Assessment/Plan:  Lethargy postoperatively.  Hopefully this is just anesthesia getting out of the system. Right hip fracture requiring operative repair.  Had surgery today. Wheeze likely transmitted from upper airway sounds.  Nebulizer ordered.  1 dose of Lasix ordered.  Continue to monitor closely. History of CVA with weakness.  Hopefully can restart Plavix tomorrow Type 2 diabetes mellitus.  Holding metformin.  Continue to monitor. Hypothyroidism unspecified on Synthroid Depression unspecified on Celexa Hyperlipidemia unspecified on simvastatin     Code Status:     Code Status Orders  (From admission, onward)           Start     Ordered   11/01/20 1529  Do not attempt resuscitation (DNR)  Continuous       Question Answer Comment  In the event of cardiac or respiratory ARREST Do not call a "code blue"   In the event of cardiac or respiratory ARREST Do not perform Intubation, CPR, defibrillation or ACLS   In the event of cardiac or respiratory ARREST Use medication by any route, position, wound care, and other measures to relive pain and suffering. May use oxygen, suction and manual treatment of airway obstruction as needed for comfort.   Comments CODE STATUS  was discussed with patient's niece at the bedside and she is a DNR.      11/01/20 1528           Code Status History     Date Active Date Inactive Code Status Order ID Comments User Context   11/01/2020 1412 11/01/2020 1528 Full Code [id number]  Collier Bullock, MD ED   08/15/2016 1503 08/18/2016 1855 DNR [id number]  Maren Reamer, MD ED   02/28/2016 1319 03/02/2016 2143 Full Code [id number]  Rondel Jumbo, PA-C ED   01/11/2013 1709 01/12/2013 2016 Full Code [id number]  Thurnell Lose, MD Inpatient   10/27/2012 1833 10/28/2012 2111  DNR [id number]  Melton Alar, PA-C Inpatient      Family Communication: Niece at the bedside Disposition Plan: Status is: Inpatient  Dispo: The patient is from: Home              Anticipated d/c is to: Rehab              Patient currently just had hip surgery today   Difficult to place patient.  No  Consultants: Orthopedic surgery  Procedures: Reduction internal fixation of displaced intertrochanteric right hip fracture  Time spent: 28 minutes  [place]

## 2020-11-03 ENCOUNTER — Inpatient Hospital Stay: Payer: Medicare Other

## 2020-11-03 DIAGNOSIS — N179 Acute kidney failure, unspecified: Secondary | ICD-10-CM

## 2020-11-03 DIAGNOSIS — R710 Precipitous drop in hematocrit: Secondary | ICD-10-CM

## 2020-11-03 LAB — GLUCOSE, CAPILLARY
Glucose-Capillary: 155 mg/dL — ABNORMAL HIGH (ref 70–99)
Glucose-Capillary: 197 mg/dL — ABNORMAL HIGH (ref 70–99)
Glucose-Capillary: 319 mg/dL — ABNORMAL HIGH (ref 70–99)
Glucose-Capillary: 111 mg/dL — ABNORMAL HIGH (ref 70–99)

## 2020-11-03 LAB — BASIC METABOLIC PANEL
Anion gap: 5 (ref 5–15)
BUN: 15 mg/dL (ref 8–23)
CO2: 30 mmol/L (ref 22–32)
Calcium: 8.5 mg/dL — ABNORMAL LOW (ref 8.9–10.3)
Chloride: 95 mmol/L — ABNORMAL LOW (ref 98–111)
Creatinine, Ser: 1.01 mg/dL — ABNORMAL HIGH (ref 0.44–1.00)
GFR, Estimated: 53 mL/min — ABNORMAL LOW (ref >60)
Glucose, Bld: 155 mg/dL — ABNORMAL HIGH (ref 70–99)
Potassium: 4.1 mmol/L (ref 3.5–5.1)
Sodium: 130 mmol/L — ABNORMAL LOW (ref 135–145)

## 2020-11-03 LAB — CBC
HCT: 24.8 % — ABNORMAL LOW (ref 36.0–46.0)
Hemoglobin: 8.3 g/dL — ABNORMAL LOW (ref 12.0–15.0)
MCH: 30.1 pg (ref 26.0–34.0)
MCHC: 33.5 g/dL (ref 30.0–36.0)
MCV: 89.9 fL (ref 80.0–100.0)
Platelets: 155 10*3/uL (ref 150–400)
RBC: 2.76 MIL/uL — ABNORMAL LOW (ref 3.87–5.11)
RDW: 13.8 % (ref 11.5–15.5)
WBC: 15.9 10*3/uL — ABNORMAL HIGH (ref 4.0–10.5)
nRBC: 0 % (ref 0.0–0.2)

## 2020-11-03 MED ORDER — SODIUM CHLORIDE 0.9 % IV BOLUS
250.0000 mL | Freq: Once | INTRAVENOUS | Status: AC
  Administered 2020-11-03: 250 mL via INTRAVENOUS

## 2020-11-03 MED ORDER — INSULIN ASPART 100 UNIT/ML IJ SOLN: 3.0000 [IU] | Freq: Three times a day (TID) | INTRAMUSCULAR | Status: DC

## 2020-11-03 MED ORDER — INSULIN ASPART 100 UNIT/ML IJ SOLN
3.0000 [IU] | Freq: Three times a day (TID) | INTRAMUSCULAR | Status: DC
  Administered 2020-11-03: 3 [IU] via SUBCUTANEOUS
  Filled 2020-11-03: qty 1

## 2020-11-03 MED ORDER — FE FUMARATE-B12-VIT C-FA-IFC PO CAPS
1.0000 | ORAL_CAPSULE | Freq: Two times a day (BID) | ORAL | Status: DC
  Administered 2020-11-03 – 2020-11-08 (×11): 1 via ORAL
  Filled 2020-11-03 (×12): qty 1

## 2020-11-03 MED ORDER — INSULIN ASPART 100 UNIT/ML IJ SOLN
0.0000 [IU] | Freq: Three times a day (TID) | INTRAMUSCULAR | Status: DC
Start: 2020-11-03 — End: 2020-11-08
  Administered 2020-11-03: 7 [IU] via SUBCUTANEOUS
  Administered 2020-11-04: 1 [IU] via SUBCUTANEOUS
  Administered 2020-11-04: 5 [IU] via SUBCUTANEOUS
  Administered 2020-11-05: 3 [IU] via SUBCUTANEOUS
  Administered 2020-11-05: 2 [IU] via SUBCUTANEOUS
  Administered 2020-11-05 – 2020-11-06 (×2): 1 [IU] via SUBCUTANEOUS
  Administered 2020-11-06 (×2): 2 [IU] via SUBCUTANEOUS
  Administered 2020-11-07: 5 [IU] via SUBCUTANEOUS
  Administered 2020-11-07: 2 [IU] via SUBCUTANEOUS
  Administered 2020-11-07: 3 [IU] via SUBCUTANEOUS
  Administered 2020-11-08: 2 [IU] via SUBCUTANEOUS
  Administered 2020-11-08: 3 [IU] via SUBCUTANEOUS
  Filled 2020-11-03 (×14): qty 1

## 2020-11-03 MED ORDER — INSULIN ASPART 100 UNIT/ML IJ SOLN: 0.0000 [IU] | Freq: Three times a day (TID) | INTRAMUSCULAR | Status: DC

## 2020-11-03 MED ORDER — CLOPIDOGREL BISULFATE 75 MG PO TABS
75.0000 mg | ORAL_TABLET | Freq: Every day | ORAL | Status: DC
  Administered 2020-11-03 – 2020-11-05 (×3): 75 mg via ORAL
  Filled 2020-11-03 (×3): qty 1

## 2020-11-03 MED ORDER — INSULIN ASPART 100 UNIT/ML IJ SOLN
0.0000 [IU] | Freq: Every day | INTRAMUSCULAR | Status: DC
  Administered 2020-11-05 – 2020-11-07 (×2): 3 [IU] via SUBCUTANEOUS
  Filled 2020-11-03 (×3): qty 1

## 2020-11-03 MED ORDER — IPRATROPIUM-ALBUTEROL 0.5-2.5 (3) MG/3ML IN SOLN: 3.0000 mL | Freq: Four times a day (QID) | RESPIRATORY_TRACT | Status: DC | PRN

## 2020-11-03 NOTE — TOC Initial Note (Signed)
Transition of Care [facility]) - Initial/Assessment Note    Patient Details  Name: Lauren [name] MRN: [id number] Date of Birth: [birth year]  Transition of [facility] CM/SW Contact:    Magnus Ivan, LCSW Phone Number: 11/03/2020, 2:46 PM  Clinical Narrative:                CSW spoke with patient's niece Lauren [name]. She reported patient has been living at [facility] since March 2022. Patient typically ambulates with a rollator. [name] thinks patient was getting HHPT at [facility]. Patient has had 2 COVID vaccines and 1 booster, [name] thinks patient might of had the 2nd booster last week. [name] is agreeable to SNF and wants [facility]. She said she has already spoken with [facility] and they agreed to accept patient. CSW will start SNF workup.   Expected Discharge Plan: Skilled Nursing Facility Barriers to Discharge: Continued Medical Work up   Patient Goals and CMS Choice Patient states their goals for this hospitalization and ongoing recovery are:: SNF rehab CMS Medicare.gov Compare Post Acute Care list provided to:: Patient Represenative (must comment) Choice offered to / list presented to :  (niece)  Expected Discharge Plan and Services Expected Discharge Plan: [place]       Living arrangements for the past 2 months: [place]                                      Prior Living Arrangements/Services Living arrangements for the past 2 months: [place] Lives with:: Facility Resident Patient language and need for interpreter reviewed:: Yes Do you feel safe going back to the place where you live?: Yes      Need for Family Participation in Patient Care: Yes (Comment) Care giver support system in place?: Yes (comment) Current home services: DME Criminal Activity/Legal Involvement Pertinent to Current Situation/Hospitalization: No - Comment as needed  Activities of Daily Living      Permission  Sought/Granted Permission sought to share information with : Facility Art therapist granted to share information with : Yes, Verbal Permission Granted     Permission granted to share info w AGENCY: SNFs        Emotional Assessment       Orientation: : Fluctuating Orientation (Suspected and/or reported Sundowners) Alcohol / Substance Use: Not Applicable Psych Involvement: No (comment)  Admission diagnosis:  Closed intertrochanteric fracture of right hip ([place]) [S72.141A] Fall, initial encounter [W19.XXXA] Closed fracture of right hip, initial encounter [facility]) [S72.001A] Patient Active Problem List   Diagnosis Date Noted   AKI (acute kidney injury) ([place])    Drop in hemoglobin    Lethargy    Closed hip fracture requiring operative repair, right, sequela    Wheeze    Depression    Closed intertrochanteric fracture of right hip ([place]) 11/01/2020   Exposure to COVID-19 virus 12/26/2018   Fall at home, initial encounter 12/16/2018   Exposure to communicable disease 12/16/2018   Diabetic neuropathy ([place]) 11/22/2018   Weight loss 08/01/2018   Bloating 11/30/2017   Hemiparesis due to recent stroke ([place]) 09/07/2017   Hypothyroid 09/62/8366   Diastolic dysfunction 29/47/6546   Urinary frequency 09/17/2016   Hx of completed stroke 08/15/2016   HTN (hypertension) 08/15/2016   GERD (gastroesophageal reflux disease) 08/06/2015   Hearing loss 08/06/2015   Estrogen deficiency 05/01/2014   Left knee pain 09/22/2013  Encounter for Medicare annual wellness exam 04/04/2013   Encounter for examination for admission to assisted living facility 04/04/2013   History of melanoma excision 09/27/2012   History of breast cancer, left 01/08/2011   Chronic abdominal pain 09/17/2010   History of pelvic fracture 09/17/2010   DEGENERATIVE JOINT DISEASE 02/13/2010   Migraine variant 11/06/2009   Osteopenia 10/18/2008   Transient ischemic attack 09/10/2008   Diabetes mellitus ([place])  10/06/2007   VENOUS INSUFFICIENCY 10/06/2007   Diverticulosis of colon 10/06/2007   FIBROCYSTIC BREAST DISEASE 10/06/2007   Hyperlipidemia associated with type 2 diabetes mellitus ([place]) 11/29/2006   PCP:  Pcp, No Pharmacy:   [facility], [place], [address], [place] [address] Phone: [phone] Fax: [phone]  [id number] [place], [place] [address] [address] Phone: [phone] Fax: [phone]     Social Determinants of Health (SDOH) Interventions    Readmission Risk Interventions No flowsheet data found.

## 2020-11-03 NOTE — Progress Notes (Signed)
Patient ID: Lauren [name], female   DOB: [birth year], 87 y.o.   MRN: [id number] [facility] PROGRESS NOTE  Lauren [name]:[id number] DOB: Sep 26, 1927 DOA: 11/01/2020 PCP: Pcp, No  HPI/Subjective: Patient feels okay.  Slight discomfort in her hip.  Breathing okay.  States she eats a regular diet at home.  No shortness of breath.  No chest pain.  Admitted with hip fracture.  Objective: Vitals:   11/03/20 0742 11/03/20 1213  BP:  (!) 129/54  Pulse: 95 88  Resp:  20  Temp:  98.1 F (36.7 C)  SpO2: 98% 100%    Intake/Output Summary (Last 24 hours) at 11/03/2020 1433 Last data filed at 11/03/2020 1019 Gross per 24 hour  Intake 563.97 ml  Output 880 ml  Net -316.03 ml   There were no vitals filed for this visit.  ROS: Review of Systems  Respiratory:  Negative for shortness of breath.   Cardiovascular:  Negative for chest pain.  Gastrointestinal:  Negative for abdominal pain, nausea and vomiting.  Exam: Physical Exam HENT:     Head: Normocephalic.     Mouth/Throat:     Pharynx: No oropharyngeal exudate.  Eyes:     General: Lids are normal.     Conjunctiva/sclera: Conjunctivae normal.  Cardiovascular:     Rate and Rhythm: Normal rate and regular rhythm.     Heart sounds: Normal heart sounds, S1 normal and S2 normal.  Pulmonary:     Breath sounds: Normal breath sounds. No decreased breath sounds, wheezing, rhonchi or rales.  Abdominal:     Palpations: Abdomen is soft.     Tenderness: There is no abdominal tenderness.  Musculoskeletal:     Right ankle: No swelling.     Left ankle: No swelling.  Skin:    General: Skin is warm.     Findings: No rash.  Neurological:     Mental Status: She is alert.     Comments: Baseline of trouble getting her words out.     Data Reviewed: Basic Metabolic Panel: Recent Labs  Lab 11/01/20 1240 11/02/20 0439 11/03/20 0454  NA 136 133* 130*  K 4.1 4.3 4.1  CL 104 100 95*  CO2 23 28 30   GLUCOSE 120* 143* 155*  BUN 12 9 15    CREATININE 0.69 0.64 1.01*  CALCIUM 9.1 8.9 8.5*    CBC: Recent Labs  Lab 11/01/20 1240 11/02/20 0439 11/03/20 0454  WBC 11.2* 14.6* 15.9*  HGB 11.3* 10.9* 8.3*  HCT 33.5* 32.2* 24.8*  MCV 88.6 87.7 89.9  PLT 210 194 155     CBG: Recent Labs  Lab 11/02/20 1204 11/02/20 1649 11/02/20 2222 11/03/20 0825 11/03/20 1216  GLUCAP 191* 181* 226* 155* 197*    Recent Results (from the past 240 hour(s))  Resp Panel by RT-PCR (Flu A&B, Covid) Nasopharyngeal Swab     Status: None   Collection Time: 11/01/20 12:48 PM   Specimen: Nasopharyngeal Swab; Nasopharyngeal(NP) swabs in vial transport medium  Result Value Ref Range Status   SARS Coronavirus 2 by RT PCR NEGATIVE NEGATIVE Final    Comment: (NOTE) SARS-CoV-2 target nucleic acids are NOT DETECTED.  The SARS-CoV-2 RNA is generally detectable in upper respiratory specimens during the acute phase of infection. The lowest concentration of SARS-CoV-2 viral copies this assay can detect is 138 copies/mL. A negative result does not preclude SARS-Cov-2 infection and should not be used as the sole basis for treatment or other patient management decisions. A negative result may occur with  improper specimen collection/handling, submission of specimen other than nasopharyngeal swab, presence of viral mutation(s) within the areas targeted by this assay, and inadequate number of viral copies(<138 copies/mL). A negative result must be combined with clinical observations, patient history, and epidemiological information. The expected result is Negative.  Fact Sheet for Patients:  EntrepreneurPulse.com.au  Fact Sheet for Healthcare Providers:  IncredibleEmployment.be  This test is no t yet approved or cleared by the [place] FDA and  has been authorized for detection and/or diagnosis of SARS-CoV-2 by FDA under an Emergency Use Authorization (EUA). This EUA will remain  in effect (meaning this  test can be used) for the duration of the COVID-19 declaration under Section 564(b)(1) of the Act, 21 U.S.C.section 360bbb-3(b)(1), unless the authorization is terminated  or revoked sooner.       Influenza A by PCR NEGATIVE NEGATIVE Final   Influenza B by PCR NEGATIVE NEGATIVE Final    Comment: (NOTE) The Xpert Xpress SARS-CoV-2/FLU/RSV plus assay is intended as an aid in the diagnosis of influenza from Nasopharyngeal swab specimens and should not be used as a sole basis for treatment. Nasal washings and aspirates are unacceptable for Xpert Xpress SARS-CoV-2/FLU/RSV testing.  Fact Sheet for Patients: EntrepreneurPulse.com.au  Fact Sheet for Healthcare Providers: IncredibleEmployment.be  This test is not yet approved or cleared by the [place] FDA and has been authorized for detection and/or diagnosis of SARS-CoV-2 by FDA under an Emergency Use Authorization (EUA). This EUA will remain in effect (meaning this test can be used) for the duration of the COVID-19 declaration under Section 564(b)(1) of the Act, 21 U.S.C. section 360bbb-3(b)(1), unless the authorization is terminated or revoked.  Performed at [facility], [address] [place], [place] [address]   Surgical PCR screen     Status: Abnormal   Collection Time: 11/02/20  5:53 AM   Specimen: Nasal Mucosa; Nasal Swab  Result Value Ref Range Status   MRSA, PCR POSITIVE (A) NEGATIVE Final    Comment: RESULT CALLED TO, READ BACK BY AND VERIFIED WITH: BREANNA CAMPBELL ON 11/02/20 AT 0730 QSD    Staphylococcus aureus POSITIVE (A) NEGATIVE Final    Comment: (NOTE) The Xpert SA Assay (FDA approved for NASAL specimens in patients 20 years of age and older), is one component of a comprehensive surveillance program. It is not intended to diagnose infection nor to guide or monitor treatment. Performed at [facility], [address] [place], [place] [address]       Studies: DG Chest [place] 1 View  Result Date: 11/03/2020 CLINICAL DATA:  87 year old female with a history of acute onset cough EXAM: PORTABLE CHEST 1 VIEW COMPARISON:  11/01/2020 FINDINGS: Cardiomediastinal silhouette unchanged in size and contour. Coarsened interstitial markings of the lungs. No pneumothorax or pleural effusion. No new confluent airspace disease. No displaced fracture. IMPRESSION: Similar appearance of the chest x-ray to the prior with no evidence of acute cardiopulmonary disease Electronically Signed   By: Corrie Mckusick D.O.   On: 11/03/2020 09:48   DG HIP OPERATIVE UNILAT W OR W/O PELVIS RIGHT  Result Date: 11/02/2020 CLINICAL DATA:  Fixation of intertrochanteric fracture. EXAM: OPERATIVE right HIP (WITH PELVIS IF PERFORMED) 16 VIEWS TECHNIQUE: Fluoroscopic spot image(s) were submitted for interpretation post-operatively. COMPARISON:  Radiograph 11/01/2020 FINDINGS: Intraoperative spot films from the operating room demonstrate initial closed reduction the intertrochanteric fracture and subsequent placement of a lung intramedullary gamma nail and proximal dynamic hip screw. Single distal interlocking screw is also noted. Good position and  alignment without complicating features. IMPRESSION: Internal fixation of the intertrochanteric fracture. Electronically Signed   By: Marijo Sanes M.D.   On: 11/02/2020 11:35    Scheduled Meds:  citalopram  5 mg Oral Daily   clopidogrel  75 mg Oral Daily   dicyclomine  10 mg Oral BID   docusate sodium  100 mg Oral BID   enoxaparin (LOVENOX) injection  40 mg Subcutaneous Q24H   feeding supplement  237 mL Oral BID BM   ferrous [id number] C-folic acid  1 capsule Oral BID   levothyroxine  50 mcg Oral Q0600   multivitamin with minerals  1 tablet Oral Daily   multivitamin-lutein  1 capsule Oral BID   mupirocin ointment  1 application Nasal BID   pantoprazole  40 mg Oral Daily   polyethylene glycol  17 g Oral Daily   polyvinyl  alcohol  1 drop Both Eyes BID   simvastatin  20 mg Oral QHS   vitamin B-12  500 mcg Oral Daily   Vitamin D (Ergocalciferol)  50,000 Units Oral Q30 days   Continuous Infusions:  sodium chloride 10 mL/hr at 11/03/20 0523   methocarbamol (ROBAXIN) IV      Assessment/Plan:  Right hip fracture requiring operative repair.  Postoperative day 1.  Physical therapy recommending rehab.  Pain control. History of CVA with weakness.  Restart Plavix. Type 2 diabetes mellitus with hyperlipidemia on simvastatin.  Holding metformin. Wheeze yesterday has resolved.  Lethargy postoperatively yesterday has resolved. Hypothyroidism unspecified on levothyroxine Depression.  Continue Celexa Drop in hemoglobin postoperatively.  Recheck hemoglobin again tomorrow. Acute kidney injury.  With wheezing yesterday did give a dose of Lasix.  I will give back a fluid bolus today.  Worsened from 0.64 up to 1.01.        Code Status:     Code Status Orders  (From admission, onward)           Start     Ordered   11/01/20 1529  Do not attempt resuscitation (DNR)  Continuous       Question Answer Comment  In the event of cardiac or respiratory ARREST Do not call a "code blue"   In the event of cardiac or respiratory ARREST Do not perform Intubation, CPR, defibrillation or ACLS   In the event of cardiac or respiratory ARREST Use medication by any route, position, wound care, and other measures to relive pain and suffering. May use oxygen, suction and manual treatment of airway obstruction as needed for comfort.   Comments CODE STATUS was discussed with patient's niece at the bedside and she is a DNR.      11/01/20 1528           Code Status History     Date Active Date Inactive Code Status Order ID Comments User Context   11/01/2020 1412 11/01/2020 1528 Full Code [id number]  Collier Bullock, MD ED   08/15/2016 1503 08/18/2016 1855 DNR [id number]  Maren Reamer, MD ED   02/28/2016 1319 03/02/2016 2143 Full  Code [id number]  Rondel Jumbo, PA-C ED   01/11/2013 1709 01/12/2013 2016 Full Code [id number]  Thurnell Lose, MD Inpatient   10/27/2012 1833 10/28/2012 2111 DNR [id number]  Melton Alar, PA-C Inpatient      Family Communication: Spoke with niece on the phone Disposition Plan: Status is: Inpatient  Dispo: The patient is from: Home              Anticipated d/c is to: Rehab  Patient currently postoperative day 1 for hip fracture   Difficult to place patient.  Hopefully not  Consultants: Orthopedic surgery  Procedures: Reduction and internal fixation of displaced intertrochanteric right hip fracture  Time spent: 27 minutes  [place]

## 2020-11-03 NOTE — Progress Notes (Signed)
   Subjective: 1 Day Post-Op Procedure(s) (LRB): INTRAMEDULLARY (IM) NAIL INTERTROCHANTRIC (Right) Patient reports pain as mild.   Patient is well, and has had no acute complaints or problems Denies any CP, SOB, ABD pain. We will continue therapy today.  Plan is to go Skilled nursing facility after hospital stay.  Objective: Vital signs in last 24 hours: Temp:  [98 F (36.7 C)-99.5 F (37.5 C)] 98 F (36.7 C) (06/12 0741) Pulse Rate:  [58-108] 95 (06/12 0742) Resp:  [15-20] 20 (06/12 0741) BP: (106-152)/(47-76) 132/62 (06/12 0741) SpO2:  [94 %-100 %] 98 % (06/12 0742)  Intake/Output from previous day: 06/11 0701 - 06/12 0700 In: 1184 [I.V.:684; IV Piggyback:500] Out: 930 [Urine:880; Blood:50] Intake/Output this shift: No intake/output data recorded.  Recent Labs    11/01/20 1240 11/02/20 0439 11/03/20 0454  HGB 11.3* 10.9* 8.3*   Recent Labs    11/02/20 0439 11/03/20 0454  WBC 14.6* 15.9*  RBC 3.67* 2.76*  HCT 32.2* 24.8*  PLT 194 155   Recent Labs    11/02/20 0439 11/03/20 0454  NA 133* 130*  K 4.3 4.1  CL 100 95*  CO2 28 30  BUN 9 15  CREATININE 0.64 1.01*  GLUCOSE 143* 155*  CALCIUM 8.9 8.5*   No results for input(s): LABPT, INR in the last 72 hours.  EXAM General - Patient is Alert, Appropriate, and Oriented Extremity - Neurovascular intact Sensation intact distally Intact pulses distally Dorsiflexion/Plantar flexion intact Incision: dressing C/D/I and no drainage No cellulitis present Compartment soft Dressing - dressing C/D/I and no drainage Motor Function - intact, moving foot and toes well on exam.   Past Medical History:  Diagnosis Date   Breast cancer ([place])    left breast   Chronic constipation    Chronic headaches    Diabetes mellitus    Fibrocystic breast    Gastritis    Gout    injection in the past   Hyperlipidemia    Melanoma in situ of left upper extremity (HCC)    Pelvic fracture (HCC)    TIA (transient ischemic  attack) 08/2008   neg MRI/MRA and CT and carotid dopplers   Venous insufficiency     Assessment/Plan:   1 Day Post-Op Procedure(s) (LRB): INTRAMEDULLARY (IM) NAIL INTERTROCHANTRIC (Right) Principal Problem:   Closed intertrochanteric fracture of right hip (HCC) Active Problems:   Diabetes mellitus (HCC)   GERD (gastroesophageal reflux disease)   Hx of completed stroke   HTN (hypertension)   Hypothyroid   Hemiparesis due to recent stroke [facility])   Lethargy   Closed hip fracture requiring operative repair, right, sequela   Wheeze   Depression  Estimated body mass index is 21.61 kg/m as calculated from the following:   Height as of 10/01/20: 5\' 3"  (1.6 m).   Weight as of 10/01/20: 55.3 kg. Advance diet Up with therapy VSS Pain well controlled Work on BM Acute post op blood loss anemia - Hgb 8.3. Start Fe supplement. Recheck labs in the am CM to assist with discharge to SNF     DVT Prophylaxis - Lovenox, Foot Pumps, and TED hose Weight-Bearing as tolerated to right leg   T. Rachelle Hora, PA-C [place] 11/03/2020, 8:22 AM

## 2020-11-03 NOTE — Evaluation (Signed)
Physical Therapy Evaluation Patient Details Name: Lauren [name] MRN: [id number] DOB: [birth year] Today's Date: 11/03/2020   History of Present Illness  Pt admitted to [facility] on 11/01/20 for mechanical fall that resulted in R hip fx. Pt s/p R ORIF with IM nailing on 11/02/20 by Dr. Roland Rack. Significant PMH includes: depression, HTN, hypothyroidism, gout, gastritis, hx breast CA, hx of CVA with residual weakness, HLD, GERD, and DM.   Clinical Impression  Pt is a 87 year old F admitted to hospital on 11/01/20 for mechanical fall that resulted in R hip fx. Pt is POD#1 R ORIF with IM nailing by Dr. Roland Rack and is WBAT on RLE. Pt questionable historian of health secondary to baseline cognitive deficit. Pt resides at ALF and reports needing assist with bathing, dressing, and IADL's; she uses a rollator for limited ambulation. Pt presents with generalized weakness, increased pain levels, decreased gross balance, impaired processing, expressive/receptive deficits, impaired motor planning, and decreased activity tolerance resulting in impaired functional mobility. Due to deficits, pt required max assist for bed mobility, mod assist for transfers, and min-mod assist to take 4 lateral steps at bedside with RW. Increased multimodal cues required for RW negotiation, balance, sequencing, and RLE facilitation. Increased assist required for RLE therex due to generalized weakness. Deficits limit the pt's ability to safely and independently perform ADL's, transfer, and ambulate. Pt will benefit from acute skilled PT services to address deficits for return to baseline function. At this time, PT recommends SNF at [place] unless ALF able to provide level of care she currently requires. If she is to return to ALF, will recommend HHPT and 24/7 care.     Follow Up Recommendations SNF    Equipment Recommendations  Other (comment) (defer to post acute)    Recommendations for Other Services       Precautions / Restrictions  Precautions Precautions: Fall Restrictions Weight Bearing Restrictions: Yes RLE Weight Bearing: Weight bearing as tolerated      Mobility  Bed Mobility Overal bed mobility: Needs Assistance Bed Mobility: Rolling;Supine to Sit;Sit to Supine Rolling: Mod assist   Supine to sit: Max assist Sit to supine: Max assist   General bed mobility comments: Mod assist for hip facilitation to roll bil in supine for linen change and repositioning. Max assist for trunk/BLE facilitation for supine<>sit transfer. Max multimodal cues for bed mobility for safety/sequencing.    Transfers Overall transfer level: Needs assistance Equipment used: Rolling walker (2 wheeled) Transfers: Sit to/from Stand Sit to Stand: Mod assist;From elevated surface         General transfer comment: Mod assist for power to stand from elevated bed height with RW, and for controlled descent to sit EOB. Multimodal cues for hand placement, sequencing, and hip extension for upright posture.  Ambulation/Gait Ambulation/Gait assistance: Mod assist Gait Distance (Feet): 2 Feet (4 lateral steps at bedside) Assistive device: Rolling walker (2 wheeled)       General Gait Details: Grossly mod assist to ambulate 4 lateral steps at bedside with RW. Min assist required for balance and mod assist required for RLE facilitation/RW negotiation. Max multimodal cues for safety and sequencing. Pt demonstrates wide BOS, absent L foot clearance with swing, and forward flexed posture.     Balance Overall balance assessment: Needs assistance Sitting-balance support: Bilateral upper extremity supported;Feet supported Sitting balance-Leahy Scale: Fair     Standing balance support: Bilateral upper extremity supported;During functional activity Standing balance-Leahy Scale: Poor Standing balance comment: Required min-mod assist standing in RW  Pertinent Vitals/Pain Pain Assessment: 0-10 Pain  Score: 2  Pain Location: R hip with mobility; 0/10 pain at rest Pain Intervention(s): Limited activity within patient's tolerance;Monitored during session;Premedicated before session;Repositioned    Home Living Family/patient expects to be discharged to:: Assisted living                 Additional Comments: per facility    Prior Function Level of Independence: Needs assistance   Gait / Transfers Assistance Needed: Mod I with Rollator  ADL's / Homemaking Assistance Needed: Assist for dressing and bathing; unknown if assist needed for toileting  Comments: Pt questionable historian of health due to cognitive deficits from previous CVA. Casandra Doffing, present during subjective questioning but unable to confirm PLOF.     Hand Dominance        Extremity/Trunk Assessment   Upper Extremity Assessment Upper Extremity Assessment: Generalized weakness (Grossly 3+/5; no sensation deficits)    Lower Extremity Assessment Lower Extremity Assessment: Generalized weakness (LLE grossly 3+/5, RLE grossly 3-/5; diminished sensation RLE L3-5 dermatome, intact coordination)    Cervical / Trunk Assessment Cervical / Trunk Assessment: Kyphotic  Communication   Communication: Expressive difficulties;Receptive difficulties (slowed processing)  Cognition Arousal/Alertness: Awake/alert Behavior During Therapy: WFL for tasks assessed/performed Overall Cognitive Status: History of cognitive impairments - at baseline                                 General Comments: Pt A&O x3 (needed cues for year, initially stating it was [place]). Able to follow 100% of simple one-step commands; intermittently requiring repetition and multimodal cues. Nephew states her impaired processing and expressive difficulties is baseline.      General Comments      Exercises Total Joint Exercises Ankle Circles/Pumps: AROM;Strengthening;Both;10 reps;Supine Quad Sets: AROM;Strengthening;Right;10 reps;Supine  (attempted; pt unable to complete on LLE) Gluteal Sets:  (attempted but pt unable) Short Arc Quad: AAROM;Strengthening;Both;10 reps;Supine Heel Slides: AAROM;Strengthening;Both;10 reps Hip ABduction/ADduction: AAROM;Strengthening;Both;10 reps Other Exercises Other Exercises: Pt able to participate in bed mobility, transfers, and short distance ambulation with RW. Pt required grossly mod-max assist for all mobility with multimodal cues for safety and sequencing. Other Exercises: Pt able to perform x10 BLE therex including: isometric hip ADD, R quad sets, heel slides, hip ABD/ADD, SAQ, and ankle pumps. Pt unable to perform glute sets or L quad sets. AAROM provided for heel slides, hip ABD/ADD, and SAQ. Other Exercises: Pt educated regarding: PT role/POC, DC recommendations, safety with mobility, use of RW, RLE WBAT precautions, and BLE therex. She verbalized understanding of all education.   Assessment/Plan    PT Assessment Patient needs continued PT services  PT Problem List Decreased strength;Decreased mobility;Decreased safety awareness;Decreased activity tolerance;Decreased cognition;Decreased balance;Pain       PT Treatment Interventions DME instruction;Gait training;Functional mobility training;Therapeutic activities;Therapeutic exercise;Balance training;Neuromuscular re-education    PT Goals (Current goals can be found in the Care Plan section)  Acute Rehab PT Goals Patient Stated Goal: "to go home" PT Goal Formulation: With patient Time For Goal Achievement: 11/17/20 Potential to Achieve Goals: Good    Frequency BID    AM-PAC PT "6 Clicks" Mobility  Outcome Measure Help needed turning from your back to your side while in a flat bed without using bedrails?: A Lot Help needed moving from lying on your back to sitting on the side of a flat bed without using bedrails?: Total Help needed moving to and from a bed to a  chair (including a wheelchair)?: A Lot Help needed standing up  from a chair using your arms (e.g., wheelchair or bedside chair)?: A Lot Help needed to walk in hospital room?: A Lot Help needed climbing 3-5 steps with a railing? : Total 6 Click Score: 10    End of Session Equipment Utilized During Treatment: Gait belt;Oxygen (1.5L O2 via [place]) Activity Tolerance: Patient tolerated treatment well;Patient limited by fatigue Patient left: in bed;with call bell/phone within reach;with bed alarm set;with SCD's reapplied (bed in chair position) Nurse Communication: Mobility status PT Visit Diagnosis: Unsteadiness on feet (R26.81);Muscle weakness (generalized) (M62.81);Difficulty in walking, not elsewhere classified (R26.2);Pain Pain - Right/Left: Right Pain - part of body: Hip    Time: 1100-1142 PT Time Calculation (min) (ACUTE ONLY): 42 min   Charges:   PT Evaluation $PT Eval Moderate Complexity: 1 Mod PT Treatments $Gait Training: 8-22 mins $Therapeutic Exercise: 8-22 mins       Herminio Commons, PT, DPT 12:19 PM,11/03/20

## 2020-11-03 NOTE — NC FL2 (Signed)
[place] LEVEL OF CARE SCREENING TOOL     IDENTIFICATION  Patient Name: Lauren [name] Birthdate: 1928/03/21 Sex: female Admission Date (Current Location): 11/01/2020  [facility] and [place] Number:  Engineering geologist and Address:  [facility], [address] [place], [place] [address]      Provider Number: [id number]  Attending Physician Name and Address:  Loletha Grayer, MD  Relative Name and Phone Number:  [name] Niece [phone]   [phone]    Current Level of Care: Hospital Recommended Level of Care: [place] Prior Approval Number:    Date Approved/Denied:   PASRR Number: [id number]  Discharge Plan:      Current Diagnoses: Patient Active Problem List   Diagnosis Date Noted   AKI (acute kidney injury) ([place])    Drop in hemoglobin    Lethargy    Closed hip fracture requiring operative repair, right, sequela    Wheeze    Depression    Closed intertrochanteric fracture of right hip ([place]) 11/01/2020   Exposure to COVID-19 virus 12/26/2018   Fall at home, initial encounter 12/16/2018   Exposure to communicable disease 12/16/2018   Diabetic neuropathy ([place]) 11/22/2018   Weight loss 08/01/2018   Bloating 11/30/2017   Hemiparesis due to recent stroke ([place]) 09/07/2017   Hypothyroid 15/40/0867   Diastolic dysfunction 61/95/0932   Urinary frequency 09/17/2016   Hx of completed stroke 08/15/2016   HTN (hypertension) 08/15/2016   GERD (gastroesophageal reflux disease) 08/06/2015   Hearing loss 08/06/2015   Estrogen deficiency 05/01/2014   Left knee pain 09/22/2013   Encounter for Medicare annual wellness exam 04/04/2013   Encounter for examination for admission to assisted living facility 04/04/2013   History of melanoma excision 09/27/2012   History of breast cancer, left 01/08/2011   Chronic abdominal pain 09/17/2010   History of pelvic fracture 09/17/2010   DEGENERATIVE JOINT DISEASE  02/13/2010   Migraine variant 11/06/2009   Osteopenia 10/18/2008   Transient ischemic attack 09/10/2008   Diabetes mellitus ([place]) 10/06/2007   VENOUS INSUFFICIENCY 10/06/2007   Diverticulosis of colon 10/06/2007   FIBROCYSTIC BREAST DISEASE 10/06/2007   Hyperlipidemia associated with type 2 diabetes mellitus ([place]) 11/29/2006    Orientation RESPIRATION BLADDER Height & Weight     Self, Time, Situation, Place  O2 External catheter, Incontinent Weight:   Height:     BEHAVIORAL SYMPTOMS/MOOD NEUROLOGICAL BOWEL NUTRITION STATUS        Diet (soft diet; thin liquids)  AMBULATORY STATUS COMMUNICATION OF NEEDS Skin   Extensive Assist Verbally Surgical wounds (incision R hip)                       Personal Care Assistance Level of Assistance  Bathing, Feeding, Dressing Bathing Assistance: Maximum assistance Feeding assistance: Limited assistance Dressing Assistance: Maximum assistance     Functional Limitations Info             SPECIAL CARE FACTORS FREQUENCY  PT (By licensed PT), OT (By licensed OT)     PT Frequency: 5 x/week OT Frequency: 5 x/week            Contractures      Additional Factors Info  Code Status, Allergies Code Status Info: DNR Allergies Info: sulfonamide derivatives, aspirin           Current Medications (11/03/2020):  This is the current hospital active medication list Current Facility-Administered Medications  Medication Dose Route Frequency Provider Last Rate Last Admin  0.9 %  sodium chloride infusion   Intravenous PRN Loletha Grayer, MD 10 mL/hr at 11/03/20 0523 Infusion Verify at 11/03/20 0523   acetaminophen (TYLENOL) tablet 325-650 mg  325-650 mg Oral Q6H PRN Poggi, Marshall Cork, MD       bisacodyl (DULCOLAX) suppository 10 mg  10 mg Rectal Daily PRN Poggi, Marshall Cork, MD       citalopram (CELEXA) tablet 5 mg  5 mg Oral Daily Poggi, Marshall Cork, MD   5 mg at 11/03/20 3818   clopidogrel (PLAVIX) tablet 75 mg  75 mg Oral Daily Loletha Grayer,  MD   75 mg at 11/03/20 1211   dicyclomine (BENTYL) capsule 10 mg  10 mg Oral BID Corky Mull, MD   10 mg at 11/03/20 2993   diphenhydrAMINE (BENADRYL) 12.5 MG/5ML elixir 12.5-25 mg  12.5-25 mg Oral Q4H PRN Poggi, Marshall Cork, MD       docusate sodium (COLACE) capsule 100 mg  100 mg Oral BID Poggi, Marshall Cork, MD   100 mg at 11/03/20 0912   enoxaparin (LOVENOX) injection 40 mg  40 mg Subcutaneous Q24H Poggi, Marshall Cork, MD   40 mg at 11/03/20 0913   feeding supplement (ENSURE ENLIVE / ENSURE PLUS) liquid 237 mL  237 mL Oral BID BM Poggi, Marshall Cork, MD   237 mL at 11/03/20 1419   ferrous [id number] C-folic acid (TRINSICON / FOLTRIN) capsule 1 capsule  1 capsule Oral BID Duanne Guess, PA-C   1 capsule at 11/03/20 5852   HYDROcodone-acetaminophen (NORCO/VICODIN) 5-325 MG per tablet 1-2 tablet  1-2 tablet Oral Q6H PRN Poggi, Marshall Cork, MD       ipratropium-albuterol (DUONEB) 0.5-2.5 (3) MG/3ML nebulizer solution 3 mL  3 mL Nebulization Q6H PRN Loletha Grayer, MD       levothyroxine (SYNTHROID) tablet 50 mcg  50 mcg Oral Q0600 Corky Mull, MD   50 mcg at 11/03/20 0531   magnesium hydroxide (MILK OF MAGNESIA) suspension 30 mL  30 mL Oral Daily PRN Poggi, Marshall Cork, MD       methocarbamol (ROBAXIN) tablet 500 mg  500 mg Oral Q6H PRN Poggi, Marshall Cork, MD       Or   methocarbamol (ROBAXIN) 500 mg in dextrose 5 % 50 mL IVPB  500 mg Intravenous Q6H PRN Poggi, Marshall Cork, MD       metoCLOPramide (REGLAN) tablet 5-10 mg  5-10 mg Oral Q8H PRN Poggi, Marshall Cork, MD       Or   metoCLOPramide (REGLAN) injection 5-10 mg  5-10 mg Intravenous Q8H PRN Poggi, Marshall Cork, MD       morphine 2 MG/ML injection 0.5 mg  0.5 mg Intravenous Q2H PRN Poggi, Marshall Cork, MD   0.5 mg at 11/01/20 2020   morphine 2 MG/ML injection 0.5-1 mg  0.5-1 mg Intravenous Q2H PRN Poggi, Marshall Cork, MD       multivitamin with minerals tablet 1 tablet  1 tablet Oral Daily Poggi, Marshall Cork, MD   1 tablet at 11/03/20 0913   multivitamin-lutein (OCUVITE-LUTEIN) capsule 1  capsule  1 capsule Oral BID Poggi, Marshall Cork, MD   1 capsule at 11/03/20 0914   mupirocin ointment (BACTROBAN) 2 % 1 application  1 application Nasal BID Poggi, Marshall Cork, MD   1 application at 77/82/42 0929   ondansetron (ZOFRAN) tablet 4 mg  4 mg Oral Q6H PRN Poggi, Marshall Cork, MD       Or   ondansetron [facility])  injection 4 mg  4 mg Intravenous Q6H PRN Poggi, Marshall Cork, MD   4 mg at 11/02/20 1231   pantoprazole (PROTONIX) EC tablet 40 mg  40 mg Oral Daily Poggi, Marshall Cork, MD   40 mg at 11/03/20 0913   polyethylene glycol (MIRALAX / GLYCOLAX) packet 17 g  17 g Oral Daily Poggi, Marshall Cork, MD   17 g at 11/03/20 0914   polyvinyl alcohol (LIQUIFILM TEARS) 1.4 % ophthalmic solution 1 drop  1 drop Both Eyes BID Poggi, Marshall Cork, MD   1 drop at 11/03/20 0919   senna-docusate (Senokot-S) tablet 1 tablet  1 tablet Oral Daily PRN Poggi, Marshall Cork, MD       simvastatin (ZOCOR) tablet 20 mg  20 mg Oral QHS Poggi, Marshall Cork, MD   20 mg at 11/02/20 2111   sodium phosphate (FLEET) 7-19 GM/118ML enema 1 enema  1 enema Rectal Once PRN Poggi, Marshall Cork, MD       traMADol Veatrice Bourbon) tablet 50 mg  50 mg Oral Q6H PRN Poggi, Marshall Cork, MD   50 mg at 11/03/20 0913   vitamin B-12 (CYANOCOBALAMIN) tablet 500 mcg  500 mcg Oral Daily Poggi, Marshall Cork, MD   500 mcg at 11/03/20 0914   Vitamin D (Ergocalciferol) (DRISDOL) capsule 50,000 Units  50,000 Units Oral Q30 days Poggi, Marshall Cork, MD         Discharge Medications: Please see discharge summary for a list of discharge medications.  Relevant Imaging Results:  Relevant Lab Results:   Additional Information SS #: [id number]  [place], LCSW

## 2020-11-04 ENCOUNTER — Inpatient Hospital Stay: Payer: Medicare Other

## 2020-11-04 ENCOUNTER — Encounter: Payer: Self-pay | Admitting: Internal Medicine

## 2020-11-04 DIAGNOSIS — G9341 Metabolic encephalopathy: Secondary | ICD-10-CM

## 2020-11-04 DIAGNOSIS — E785 Hyperlipidemia, unspecified: Secondary | ICD-10-CM

## 2020-11-04 DIAGNOSIS — R471 Dysarthria and anarthria: Secondary | ICD-10-CM

## 2020-11-04 DIAGNOSIS — E1169 Type 2 diabetes mellitus with other specified complication: Secondary | ICD-10-CM

## 2020-11-04 DIAGNOSIS — R531 Weakness: Secondary | ICD-10-CM

## 2020-11-04 LAB — TSH: TSH: 1.28 u[IU]/mL (ref 0.350–4.500)

## 2020-11-04 LAB — VITAMIN B12: Vitamin B-12: 1187 pg/mL — ABNORMAL HIGH (ref 180–914)

## 2020-11-04 LAB — CBC
HCT: 24.6 % — ABNORMAL LOW (ref 36.0–46.0)
Hemoglobin: 8.2 g/dL — ABNORMAL LOW (ref 12.0–15.0)
MCH: 30.1 pg (ref 26.0–34.0)
MCHC: 33.3 g/dL (ref 30.0–36.0)
MCV: 90.4 fL (ref 80.0–100.0)
Platelets: 149 10*3/uL — ABNORMAL LOW (ref 150–400)
RBC: 2.72 MIL/uL — ABNORMAL LOW (ref 3.87–5.11)
RDW: 13.9 % (ref 11.5–15.5)
WBC: 14.2 10*3/uL — ABNORMAL HIGH (ref 4.0–10.5)
nRBC: 0 % (ref 0.0–0.2)

## 2020-11-04 LAB — HEPATIC FUNCTION PANEL
ALT: <5 U/L (ref 0–44)
AST: 15 U/L (ref 15–41)
Albumin: 2.6 g/dL — ABNORMAL LOW (ref 3.5–5.0)
Alkaline Phosphatase: 66 U/L (ref 38–126)
Bilirubin, Direct: 0.2 mg/dL (ref 0.0–0.2)
Indirect Bilirubin: 0.3 mg/dL (ref 0.3–0.9)
Total Bilirubin: 0.5 mg/dL (ref 0.3–1.2)
Total Protein: 5.4 g/dL — ABNORMAL LOW (ref 6.5–8.1)

## 2020-11-04 LAB — GLUCOSE, CAPILLARY
Glucose-Capillary: 157 mg/dL — ABNORMAL HIGH (ref 70–99)
Glucose-Capillary: 149 mg/dL — ABNORMAL HIGH (ref 70–99)
Glucose-Capillary: 146 mg/dL — ABNORMAL HIGH (ref 70–99)
Glucose-Capillary: 264 mg/dL — ABNORMAL HIGH (ref 70–99)
Glucose-Capillary: 102 mg/dL — ABNORMAL HIGH (ref 70–99)

## 2020-11-04 LAB — BASIC METABOLIC PANEL
Anion gap: 5 (ref 5–15)
BUN: 15 mg/dL (ref 8–23)
CO2: 30 mmol/L (ref 22–32)
Calcium: 8.5 mg/dL — ABNORMAL LOW (ref 8.9–10.3)
Chloride: 97 mmol/L — ABNORMAL LOW (ref 98–111)
Creatinine, Ser: 0.61 mg/dL (ref 0.44–1.00)
GFR, Estimated: >60 mL/min (ref >60)
Glucose, Bld: 156 mg/dL — ABNORMAL HIGH (ref 70–99)
Potassium: 4.1 mmol/L (ref 3.5–5.1)
Sodium: 132 mmol/L — ABNORMAL LOW (ref 135–145)

## 2020-11-04 LAB — APTT: aPTT: 32 seconds (ref 24–36)

## 2020-11-04 LAB — PROTIME-INR
INR: 1 (ref 0.8–1.2)
Prothrombin Time: 13.6 seconds (ref 11.4–15.2)

## 2020-11-04 LAB — MAGNESIUM: Magnesium: 1.9 mg/dL (ref 1.7–2.4)

## 2020-11-04 MED ORDER — IOHEXOL 350 MG/ML SOLN
100.0000 mL | Freq: Once | INTRAVENOUS | Status: AC | PRN
  Administered 2020-11-04: 100 mL via INTRAVENOUS

## 2020-11-04 MED ORDER — METOPROLOL TARTRATE 5 MG/5ML IV SOLN
5.0000 mg | INTRAVENOUS | Status: DC | PRN
  Administered 2020-11-04 – 2020-11-06 (×2): 5 mg via INTRAVENOUS
  Filled 2020-11-04 (×2): qty 5

## 2020-11-04 MED ORDER — METOPROLOL SUCCINATE ER 25 MG PO TB24
25.0000 mg | ORAL_TABLET | Freq: Every day | ORAL | Status: DC
  Administered 2020-11-05 – 2020-11-08 (×4): 25 mg via ORAL
  Filled 2020-11-04 (×4): qty 1

## 2020-11-04 MED ORDER — HYDROCODONE-ACETAMINOPHEN 5-325 MG PO TABS: 1.0000 | ORAL_TABLET | Freq: Four times a day (QID) | ORAL | Status: DC | PRN

## 2020-11-04 MED ORDER — NALOXONE HCL 0.4 MG/ML IJ SOLN
0.4000 mg | INTRAMUSCULAR | Status: DC | PRN
  Administered 2020-11-04 (×2): 0.4 mg via INTRAVENOUS
  Filled 2020-11-04 (×2): qty 1

## 2020-11-04 MED ORDER — SODIUM CHLORIDE 0.9 % IV SOLN: INTRAVENOUS | Status: DC

## 2020-11-04 NOTE — Code Documentation (Signed)
Code Stroke canceled per Dr. Maurine Minister, [place] notified

## 2020-11-04 NOTE — Progress Notes (Signed)
Patient ID: Lauren [name], female   DOB: 1927-10-28, 87 y.o.   MRN: [id number]  Called niece to review MRI.  MRI did show a few punctate acute to subacute infarcts involving the parasagittal left frontal lobe, superior lateral left thalamus, bilateral occipital lobes.  The patient also does have chronic microhemorrhages in a pattern suggesting sequelae of hypertension.  Chronic microvascular ischemic changes.  A. fib noted on telemetry.  With the findings above we will hold off on major anticoagulation at this point and continue Plavix.  Will order an echocardiogram.  Lung nodules also seen on CT scan of the head and neck.  Will end up needing a CT scan of the chest at some point but we will see how she does with her progress first.  Dr Loletha Grayer

## 2020-11-04 NOTE — Care Management Important Message (Signed)
Important Message  Patient Details  Name: Lauren [name] MRN: [id number] Date of Birth: May 22, 1928   Medicare Important Message Given:  Yes  Reviewed the Important Message from Medicare with patient but she was unable to sign.  I let her know that was fine and thanked her for her time.  Juliann Pulse A Imanni Burdine 11/04/2020, 1:59 PM

## 2020-11-04 NOTE — TOC Progression Note (Addendum)
Transition of Care [facility]) - Progression Note    Patient Details  Name: KARYSS FRESE MRN: [id number] Date of Birth: 1928/03/26  Transition of Care [facility] CM/SW [place], RN Phone Number: 11/04/2020, 10:12 AM  Clinical Narrative:    Received a call from Andee Poles at [facility] they are accepting the patient at DC, Spoke with nancy the patient's niece and confirmed   Expected Discharge Plan: Skilled Nursing Facility Barriers to Discharge: Continued Medical Work up  Expected Discharge Plan and Services Expected Discharge Plan: [place] arrangements for the past 2 months: [place]                                       Social Determinants of Health (SDOH) Interventions    Readmission Risk Interventions No flowsheet data found.

## 2020-11-04 NOTE — Progress Notes (Signed)
Patient ID: Lauren [name], female   DOB: 10-08-1927, 87 y.o.   MRN: [id number] [facility] PROGRESS NOTE  Lauren [name] YQI:[id number] DOB: [birth year] DOA: 11/01/2020 PCP: Pcp, No  HPI/Subjective: Patient unable to talk this morning.  As per nursing staff was noted well at the time of sleeping.  Early morning noticed to be different with her mental status.  A CT scan of the head was done which was negative.  This morning, I did give a dose of Narcan.  Patient does speak verbalized when painful stimuli but does not verbalize otherwise currently.  Normal neuro status as she is able to understand but has some difficulty getting words out.  Objective: Vitals:   11/04/20 0659 11/04/20 0700  BP: (!) 157/60 (!) 135/93  Pulse: 85 88  Resp: 20 18  Temp: 98.5 F (36.9 C) 98.7 F (37.1 C)  SpO2: 98% 99%    Intake/Output Summary (Last 24 hours) at 11/04/2020 3875 Last data filed at 11/03/2020 2022 Gross per 24 hour  Intake 368.17 ml  Output 150 ml  Net 218.17 ml   There were no vitals filed for this visit.  ROS: Review of Systems  Unable to perform ROS: Acuity of condition  Exam: Physical Exam HENT:     Head: Normocephalic.     Mouth/Throat:     Pharynx: No oropharyngeal exudate.  Eyes:     General: Lids are normal.     Conjunctiva/sclera: Conjunctivae normal.  Cardiovascular:     Rate and Rhythm: Normal rate and regular rhythm.     Heart sounds: Normal heart sounds, S1 normal and S2 normal.  Pulmonary:     Breath sounds: Normal breath sounds. No decreased breath sounds, wheezing, rhonchi or rales.  Abdominal:     Palpations: Abdomen is soft.     Tenderness: There is no abdominal tenderness.  Musculoskeletal:     Right lower leg: No swelling.     Left lower leg: No swelling.  Skin:    General: Skin is warm.     Findings: No rash.  Neurological:     Mental Status: She is alert.     Comments: Only verbalizes to painful stimuli.  Able to follow some commands.  Weaker on  the right side when holding her arm up cannot hold it up for 10 seconds.     Data Reviewed: Basic Metabolic Panel: Recent Labs  Lab 11/01/20 1240 11/02/20 0439 11/03/20 0454 11/04/20 0456  NA 136 133* 130* 132*  K 4.1 4.3 4.1 4.1  CL 104 100 95* 97*  CO2 23 28 30 30   GLUCOSE 120* 143* 155* 156*  BUN 12 9 15 15   CREATININE 0.69 0.64 1.01* 0.61  CALCIUM 9.1 8.9 8.5* 8.5*  MG  --   --   --  1.9   Liver Function Tests: Recent Labs  Lab 11/04/20 0456  AST 15  ALT <5  ALKPHOS 66  BILITOT 0.5  PROT 5.4*  ALBUMIN 2.6*   CBC: Recent Labs  Lab 11/01/20 1240 11/02/20 0439 11/03/20 0454 11/04/20 0456  WBC 11.2* 14.6* 15.9* 14.2*  HGB 11.3* 10.9* 8.3* 8.2*  HCT 33.5* 32.2* 24.8* 24.6*  MCV 88.6 87.7 89.9 90.4  PLT 210 194 155 149*    CBG: Recent Labs  Lab 11/03/20 0825 11/03/20 1216 11/03/20 1643 11/03/20 2141 11/04/20 0455  GLUCAP 155* 197* 319* 111* 157*    Recent Results (from the past 240 hour(s))  Resp Panel by RT-PCR (Flu A&B, Covid) Nasopharyngeal Swab  Status: None   Collection Time: 11/01/20 12:48 PM   Specimen: Nasopharyngeal Swab; Nasopharyngeal(NP) swabs in vial transport medium  Result Value Ref Range Status   SARS Coronavirus 2 by RT PCR NEGATIVE NEGATIVE Final    Comment: (NOTE) SARS-CoV-2 target nucleic acids are NOT DETECTED.  The SARS-CoV-2 RNA is generally detectable in upper respiratory specimens during the acute phase of infection. The lowest concentration of SARS-CoV-2 viral copies this assay can detect is 138 copies/mL. A negative result does not preclude SARS-Cov-2 infection and should not be used as the sole basis for treatment or other patient management decisions. A negative result may occur with  improper specimen collection/handling, submission of specimen other than nasopharyngeal swab, presence of viral mutation(s) within the areas targeted by this assay, and inadequate number of viral copies(<138 copies/mL). A negative  result must be combined with clinical observations, patient history, and epidemiological information. The expected result is Negative.  Fact Sheet for Patients:  EntrepreneurPulse.com.au  Fact Sheet for Healthcare Providers:  IncredibleEmployment.be  This test is no t yet approved or cleared by the [place] FDA and  has been authorized for detection and/or diagnosis of SARS-CoV-2 by FDA under an Emergency Use Authorization (EUA). This EUA will remain  in effect (meaning this test can be used) for the duration of the COVID-19 declaration under Section 564(b)(1) of the Act, 21 U.S.C.section 360bbb-3(b)(1), unless the authorization is terminated  or revoked sooner.       Influenza A by PCR NEGATIVE NEGATIVE Final   Influenza B by PCR NEGATIVE NEGATIVE Final    Comment: (NOTE) The Xpert Xpress SARS-CoV-2/FLU/RSV plus assay is intended as an aid in the diagnosis of influenza from Nasopharyngeal swab specimens and should not be used as a sole basis for treatment. Nasal washings and aspirates are unacceptable for Xpert Xpress SARS-CoV-2/FLU/RSV testing.  Fact Sheet for Patients: EntrepreneurPulse.com.au  Fact Sheet for Healthcare Providers: IncredibleEmployment.be  This test is not yet approved or cleared by the [place] FDA and has been authorized for detection and/or diagnosis of SARS-CoV-2 by FDA under an Emergency Use Authorization (EUA). This EUA will remain in effect (meaning this test can be used) for the duration of the COVID-19 declaration under Section 564(b)(1) of the Act, 21 U.S.C. section 360bbb-3(b)(1), unless the authorization is terminated or revoked.  Performed at [facility], [address], [place], [place] [address]   Surgical PCR screen     Status: Abnormal   Collection Time: 11/02/20  5:53 AM   Specimen: Nasal Mucosa; Nasal Swab  Result Value Ref Range Status    MRSA, PCR POSITIVE (A) NEGATIVE Final    Comment: RESULT CALLED TO, READ BACK BY AND VERIFIED WITH: BREANNA CAMPBELL ON 11/02/20 AT 0730 QSD    Staphylococcus aureus POSITIVE (A) NEGATIVE Final    Comment: (NOTE) The Xpert SA Assay (FDA approved for NASAL specimens in patients 3 years of age and older), is one component of a comprehensive surveillance program. It is not intended to diagnose infection nor to guide or monitor treatment. Performed at [facility], [address] [place], [place] [address]      Studies: CT HEAD WO CONTRAST  Result Date: 11/04/2020 CLINICAL DATA:  Mental status change with unknown cause EXAM: CT HEAD WITHOUT CONTRAST TECHNIQUE: Contiguous axial images were obtained from the base of the skull through the vertex without intravenous contrast. COMPARISON:  Brain MRI 08/15/2016 FINDINGS: Brain: No evidence of acute infarction, hemorrhage, hydrocephalus, extra-axial collection or mass lesion/mass effect.  Chronic small vessel ischemia in the deep cerebral white matter. Chronic lacunar infarct in the left corona radiata. Vascular: No hyperdense vessel or unexpected calcification. Skull: Normal. Negative for fracture or focal lesion. Sinuses/Orbits: No acute finding. IMPRESSION: 1. No acute or reversible finding. 2. Chronic small vessel disease. Electronically Signed   By: Monte Fantasia M.D.   On: 11/04/2020 05:47   DG Chest Port 1 View  Result Date: 11/03/2020 CLINICAL DATA:  87 year old female with a history of acute onset cough EXAM: PORTABLE CHEST 1 VIEW COMPARISON:  11/01/2020 FINDINGS: Cardiomediastinal silhouette unchanged in size and contour. Coarsened interstitial markings of the lungs. No pneumothorax or pleural effusion. No new confluent airspace disease. No displaced fracture. IMPRESSION: Similar appearance of the chest x-ray to the prior with no evidence of acute cardiopulmonary disease Electronically Signed   By: Corrie Mckusick D.O.   On: 11/03/2020  09:48   DG HIP OPERATIVE UNILAT W OR W/O PELVIS RIGHT  Result Date: 11/02/2020 CLINICAL DATA:  Fixation of intertrochanteric fracture. EXAM: OPERATIVE right HIP (WITH PELVIS IF PERFORMED) 16 VIEWS TECHNIQUE: Fluoroscopic spot image(s) were submitted for interpretation post-operatively. COMPARISON:  Radiograph 11/01/2020 FINDINGS: Intraoperative spot films from the operating room demonstrate initial closed reduction the intertrochanteric fracture and subsequent placement of a lung intramedullary gamma nail and proximal dynamic hip screw. Single distal interlocking screw is also noted. Good position and alignment without complicating features. IMPRESSION: Internal fixation of the intertrochanteric fracture. Electronically Signed   By: Marijo Sanes M.D.   On: 11/02/2020 11:35    Scheduled Meds:  citalopram  5 mg Oral Daily   clopidogrel  75 mg Oral Daily   dicyclomine  10 mg Oral BID   docusate sodium  100 mg Oral BID   enoxaparin (LOVENOX) injection  40 mg Subcutaneous Q24H   feeding supplement  237 mL Oral BID BM   ferrous [id number] C-folic acid  1 capsule Oral BID   insulin aspart  0-5 Units Subcutaneous QHS   insulin aspart  0-9 Units Subcutaneous TID WC   insulin aspart  3 Units Subcutaneous TID WC   levothyroxine  50 mcg Oral Q0600   metoprolol succinate  25 mg Oral Daily   multivitamin with minerals  1 tablet Oral Daily   multivitamin-lutein  1 capsule Oral BID   mupirocin ointment  1 application Nasal BID   pantoprazole  40 mg Oral Daily   polyethylene glycol  17 g Oral Daily   polyvinyl alcohol  1 drop Both Eyes BID   simvastatin  20 mg Oral QHS   vitamin B-12  500 mcg Oral Daily   Vitamin D (Ergocalciferol)  50,000 Units Oral Q30 days   Continuous Infusions:  sodium chloride 10 mL/hr at 11/03/20 0523   sodium chloride     methocarbamol (ROBAXIN) IV      Assessment/Plan:  New right-sided weakness and inability to verbalize except for when painful stimuli.  Case  discussed with neurology and code stroke called.  CT head overnight was negative.  CT angio ordered.  Patient already on Plavix restarted yesterday after surgery.  Since patient last known well at bedtime, and recent hip surgery.  Patient not a tPA candidate.  Swallow eval. Right hip fracture requiring operative repair.  Discontinue pain medications.  We will give a second dose of Narcan.  Tylenol for pain. EKG shows sinus tachycardia sinus arrhythmia.  Will put on telemetry to monitor his for signs of atrial fibrillation. Type 2 diabetes mellitus with hyperlipidemia.  Holding  metformin.  Continue simvastatin. Hypothyroidism unspecified on levothyroxine Depression.  Continue Celexa Drop in hemoglobin postoperatively.  Hemoglobin 8.2 this morning.  Continue to monitor. Acute kidney injury.  Did improve with fluid bolus yesterday.  Creatinine back to 0.61. Patient listed as a DNR. Spoke with family and given update.        Code Status:     Code Status Orders  (From admission, onward)           Start     Ordered   11/01/20 1529  Do not attempt resuscitation (DNR)  Continuous       Question Answer Comment  In the event of cardiac or respiratory ARREST Do not call a "code blue"   In the event of cardiac or respiratory ARREST Do not perform Intubation, CPR, defibrillation or ACLS   In the event of cardiac or respiratory ARREST Use medication by any route, position, wound care, and other measures to relive pain and suffering. May use oxygen, suction and manual treatment of airway obstruction as needed for comfort.   Comments CODE STATUS was discussed with patient's niece at the bedside and she is a DNR.      11/01/20 1528           Code Status History     Date Active Date Inactive Code Status Order ID Comments User Context   11/01/2020 1412 11/01/2020 1528 Full Code [id number]  Collier Bullock, MD ED   08/15/2016 1503 08/18/2016 1855 DNR [id number]  Maren Reamer, MD ED    02/28/2016 1319 03/02/2016 2143 Full Code [id number]  Rondel Jumbo, PA-C ED   01/11/2013 1709 01/12/2013 2016 Full Code [id number]  Thurnell Lose, MD Inpatient   10/27/2012 1833 10/28/2012 2111 DNR [id number]  Melton Alar, PA-C Inpatient      Family Communication: Spoke with Kennon Holter on the phone. Disposition Plan: Status is: Inpatient  Dispo: The patient is from: Home              Anticipated d/c is to: Rehab              Patient currently undergoing code stroke protocol.   Difficult to place patient.  No.  Consultants: Neurology Orthopedic surgery  Time spent: 40 minutes  [place]

## 2020-11-04 NOTE — Progress Notes (Signed)
CODE STROKE- PHARMACY COMMUNICATION   Time CODE STROKE called/page received:0830  Time response to CODE STROKE was made (in person or via phone): 0840  Time Stroke Kit retrieved from [facility] (only if needed): N/A  Name of Provider/Nurse contacted:Oliva, RN  Past Medical History:  Diagnosis Date   Breast cancer ([place])    left breast   Chronic constipation    Chronic headaches    Diabetes mellitus    Fibrocystic breast    Gastritis    Gout    injection in the past   Hyperlipidemia    Melanoma in situ of left upper extremity ([place])    Pelvic fracture ([place])    TIA (transient ischemic attack) 08/2008   neg MRI/MRA and CT and carotid dopplers   Venous insufficiency    Prior to Admission medications   Medication Sig Start Date End Date Taking? Authorizing Provider  Cholecalciferol (VITAMIN D3) 1.25 MG (50000 UT) CAPS Take 50,000 Units by mouth every 30 (thirty) days.   Yes [provider]  citalopram (CELEXA) 10 MG tablet Take 0.5 tablets (5 mg total) by mouth daily. 11/22/18  Yes Tower, Wynelle Fanny, MD  clopidogrel (PLAVIX) 75 MG tablet TAKE ONE TABLET BY MOUTH EVERY MORNING WITH BREAKFAST. Patient taking differently: Take 75 mg by mouth daily. 11/22/18  Yes Tower, Wynelle Fanny, MD  dicyclomine (BENTYL) 10 MG capsule Take 10 mg by mouth 2 (two) times daily.   Yes [provider]  docusate sodium (COLACE) 100 MG capsule Take 100 mg by mouth daily.   Yes [provider]  fluticasone (FLONASE) 50 MCG/ACT nasal spray Place 1 spray into both nostrils daily.    Yes [provider]  gabapentin (NEURONTIN) 100 MG capsule Take 100 mg by mouth at bedtime. 10/31/20  Yes [provider]  levothyroxine (SYNTHROID) 50 MCG tablet Take 1 tablet (50 mcg total) by mouth daily before breakfast. 30 minutes before other medicines or supplements 11/22/18  Yes Tower, Wynelle Fanny, MD  metFORMIN (GLUCOPHAGE) 500 MG tablet Take 0.5 tablets (250 mg total) by mouth 2 (two) times daily  with a meal. 11/22/18  Yes Tower, Wynelle Fanny, MD  Multiple Vitamins-Minerals (PRESERVISION AREDS 2 PO) Take 1 tablet by mouth 2 (two) times daily.   Yes [provider]  oxybutynin (DITROPAN-XL) 5 MG 24 hr tablet Take 5 mg by mouth daily. 10/31/20  Yes [provider]  pantoprazole (PROTONIX) 40 MG tablet Take 1 tablet (40 mg total) by mouth daily. 11/22/18  Yes Tower, Marne A, MD  polyethylene glycol powder (GLYCOLAX/MIRALAX) powder DISSOLVE ONE (1) CAPFUL (17GM) INTO 4 TO8 OUNCES OF FLUID AND TAKE BYMOUTH ONCE DAILY AS DIRECTED Patient taking differently: Take 17 g by mouth daily. DISSOLVE ONE (1) CAPFUL (17GM) INTO 4 TO8 OUNCES OF FLUID AND TAKE BYMOUTH ONCE DAILY AS DIRECTED 03/23/17  Yes Tower, Wynelle Fanny, MD  polyvinyl alcohol (LIQUIFILM TEARS) 1.4 % ophthalmic solution Place 1 drop into both eyes 2 (two) times daily.   Yes [provider]  Probiotic Product (ALIGN) 4 MG CAPS Take 4 mg by mouth daily.    Yes [provider]  simvastatin (ZOCOR) 20 MG tablet Take 1 tablet (20 mg total) by mouth at bedtime. 11/22/18  Yes Tower, Wynelle Fanny, MD  sulfamethoxazole-trimethoprim (BACTRIM DS) 800-160 MG tablet Take 1 tablet by mouth 2 (two) times daily. 10/31/20  Yes [provider]  vitamin B-12 (CYANOCOBALAMIN) 500 MCG tablet Take 500 mcg by mouth daily.   Yes [provider]  acetaminophen (TYLENOL) 325 MG tablet Take 2 tablets (650 mg total) by mouth every 6 (six) hours as needed for mild pain (or Fever >/= 101). 03/02/16   Laqueta Linden, MD  alum & mag hydroxide-simeth (MAALOX/MYLANTA) 200-200-20 MG/5ML suspension Take 30 mLs by mouth every 6 (six) hours as needed (gas, abdominal pain).    [provider]  Blood Glucose Monitoring Suppl (FREESTYLE LITE) DEVI USE AS DIRECTED TO CHECK BLOOD SUGAR ONCE A DAY AND AS NEEDED (DX. E11.9) 06/16/18   Tower, Marne A, MD  glucose blood (FREESTYLE LITE) test strip USE AS DIRECTED TO CHECK BLOOD SUGAR ONCE A DAY  AND AS NEEDED (DX. E11.9) 06/16/18   Tower, Wynelle Fanny, MD  Lancets (FREESTYLE) lancets USE AS DIRECTED TO CHECK BLOOD SUGAR ONCE A DAY AND AS NEEDED (DX. E11.9) 06/16/18   Tower, Wynelle Fanny, MD  omeprazole (PRILOSEC) 20 MG capsule Take 20 mg by mouth daily. Patient not taking: No sig reported    [provider]  sennosides-docusate sodium (SENOKOT-S) 8.6-50 MG tablet Take 1 tablet by mouth daily as needed for constipation.    [provider]    Pernell Dupre, PharmD, BCPS Clinical Pharmacist 11/04/2020 8:45 AM

## 2020-11-04 NOTE — Progress Notes (Signed)
Neurology Progress Note  Patient ID: Lauren [name] is a 87 y.o. with PMHx of  has a past medical history of Breast cancer ([place]), Chronic constipation, Chronic headaches, Diabetes mellitus, Fibrocystic breast, Gastritis, Gout, Hyperlipidemia, Melanoma in situ of left upper extremity ([place]), Pelvic fracture ([place]), TIA (transient ischemic attack) (08/2008), and Venous insufficiency.  Initially consulted for: AMS  Major interval events:  - Afib confirmed on telemetry, remains in Afib - Primary team planning to transfuse today for hemoglobin drop   Subjective: - Anxious overall, particularly about her heart - No headache, no acute neurological complaints - Feels hip is doing well - Feels like she's not herself as she's eating breakfast late (delayed due to ECHO)  Exam: Current vital signs: BP (!) 147/64 (BP Location: Left Arm)   Pulse 79   Temp 98.6 F (37 C) (Oral)   Resp 15   SpO2 100%  Vital signs in last 24 hours: Temp:  [97.5 F (36.4 C)-99.7 F (37.6 C)] 98.6 F (37 C) (06/14 0803) Pulse Rate:  [74-114] 79 (06/14 0803) Resp:  [15-28] 15 (06/14 0803) BP: (111-163)/(55-76) 147/64 (06/14 0803) SpO2:  [90 %-100 %] 100 % (06/14 0803)   Gen: In bed, comfortable, eating breakfast Resp: non-labored breathing, no grossly audible wheezing Cardiac: Perfusing extremities well, HR irregularly irregular Abd: soft, nt Psych: Fairly anxious, does not want examiner to leave   Neuro: MS: Awake, alert, oriented to person, [place], month, situation but not year (19... trails off). Speech is slow but able to name and repeat and mostly seems just anxious  CN: EMOI, PERRL, orients to stimuli in all visual fields though finger counting is not accurately reported, face symmetric, tongue midlien Motor: No pronator drift. Wiggles bilateral toes to command Sensory:Intact to light touch throughout Coordination: intact finger to nose bilaterally   Pertinent Labs: Lab Results  Component Value  Date   CHOL 86 11/05/2020   HDL 30 (L) 11/05/2020   LDLCALC 34 11/05/2020   LDLDIRECT 49.0 02/23/2017   TRIG 108 11/05/2020   CHOLHDL 2.9 11/05/2020   Lab Results  Component Value Date   HGBA1C 6.9 (H) 11/15/2018    Impression: Cardioemoblic punctate strokes, mental status possibly yesterday possibly secondary to Afib, TIA or hypoactive delirium.   Recommendations: - LDL meeting goal < 70, continue home simvastatin 20 mg QHS - Given small size of stroke, from neurological prespective okay to start Eliquis 6/15 per most recent [place] guidelines; please  discontinue plavix at that time, if cleared by surgery for [facility] and Hgb remains stable per primary team - A1c meeting goal < 7% - Neurology will follow up ECHO read   [place] [phone]  [facility] coverage for [facility] is from 8 AM to 4 AM in-house and 4 PM to 8 PM by telephone/video. 8 PM to 8 AM emergent questions or overnight urgent questions should be addressed to Teleneurology On-call or [facility] neurohospitalist; contact information can be found on AMION

## 2020-11-04 NOTE — TOC Progression Note (Signed)
Transition of Care [facility]) - Progression Note    Patient Details  Name: JENNAVECIA SCHWIER MRN: [id number] Date of Birth: 09-08-1927  Transition of Care [facility]) CM/SW [place], RN Phone Number: 11/04/2020, 9:21 AM  Clinical Narrative:   Jeralene Huff out to Olivia Mackie at [facility] to confirm that they are going to accept the patient, Left a voice mail for a return call    Expected Discharge Plan: [place] Barriers to Discharge: Continued Medical Work up  Expected Discharge Plan and Services Expected Discharge Plan: [place] arrangements for the past 2 months: Edith Endave                                       Social Determinants of Health (SDOH) Interventions    Readmission Risk Interventions No flowsheet data found.

## 2020-11-04 NOTE — Significant Event (Signed)
Rapid Response Event Note   Reason for Call :  Code stroke  Initial Focused Assessment:  Rapid response RN arrived in patient's room with patient starring into space but alert and Dr. Curly Shores from neurology at bedside with 1A nurses. Per report patient had altered mental status since around 04:30, unable to speak or follow many commands with right sided weakness. Patient was able to state her age to Dr. Curly Shores but that was all verbal response at this time. Patient would move her eyes but did not seem to consistently track staff. CBG 149 at this time. HR 108-124 on monitor, difficult to tell if afib or sinus rhythm (often appeared afib but sometimes ST). BP 152/80 MAP 101. RR 23-26 (up to 32 when patient  being moved), unlabored when not being moved. 97% SpO2 on 2L nasal cannula.  Interventions:  Mickle Plumb started 18 g IV. Blood drawn for labs per Dr. Curly Shores. Patient to CT for imaging which was personally reviewed by Dr. Curly Shores and Code Stroke cancelled. Patient taken back to room 138. Repeat EKG taken once back in patient's room due to concern while on rapid response monitor that patient going in and out of afib. Difficulty obtaining pulse oximetry on patient hands (very cold extremities), able to obtain a good waveform that correlated with heart rate on patient's ear which showed SpO2 of 98% on 2L Nasal cannula when NT was taking patient's vital signs with Dinamap back in patient's room. Patient's family member arrived in patient's room shortly after patient arrived back and patient was able to correctly identify visitor's name.  Plan of Care:  Patient to remain on 1A for now. To be placed on continuous cardiac monitoring. Updated patient's nurse before left 1A.  Event Summary:   MD Notified: Dr. Curly Shores Call Time: 08:26 Arrival Time: 08:28 End Time: 09:31  Shalaunda Weatherholtz, Jaynie Bream, RN

## 2020-11-04 NOTE — Progress Notes (Signed)
Follow up from previous rapid response. Patient was resting in bed and alert. Answered a few questions but not consistently. Told this RN that she was "fine" but also asked if she was dead (confirmed with patient that she is alive). When touching base with patient's RN, only concern at this time that patient has slight tachypneic with mouth breathing and shallow respirations which is not patient's baseline breathing pattern per her visitor. Patient's nurse had already notified the patient's doctor about her concerns and was going to reach out to RT to assess patient as well.

## 2020-11-04 NOTE — Progress Notes (Signed)
PT Cancellation Note  Patient Details Name: Lauren [name] MRN: [id number] DOB: 08/03/1927   Cancelled Treatment:    Reason Eval/Treat Not Completed: Medical issues which prohibited therapy  Pt with new neuro event.  Nursing in with pt.  Will hold for today and continue as appropriate next session. May need new orders vs re-eval depending on outcome/evolution of symptoms.   Chesley Noon 11/04/2020, 8:36 AM

## 2020-11-04 NOTE — Progress Notes (Signed)
   11/04/20 0425  Assess: MEWS Score  Temp 97.7 F (36.5 C)  BP (!) 156/61  Pulse Rate 99  Resp 20  Level of Consciousness Responds to Pain  SpO2 97 %  O2 Device Room Air  Assess: MEWS Score  MEWS Temp 0  MEWS Systolic 0  MEWS Pulse 0  MEWS RR 0  MEWS LOC 2  MEWS Score 2  MEWS Score Color Yellow  Assess: if the MEWS score is Yellow or Red  Were vital signs taken at a resting state? Yes  Early Detection of Sepsis Score *See Row Information* Low  Treat  MEWS Interventions Escalated (See documentation below)  Pain Scale PAINAD  Pain Score 0  Take Vital Signs  Increase Vital Sign Frequency  Yellow: Q 2hr X 2 then Q 4hr X 2, if remains yellow, continue Q 4hrs  Notify: Charge Nurse/RN  Name of Charge Nurse/RN Notified Lenese  Date Charge Nurse/RN Notified 11/04/20  Time Charge Nurse/RN Notified 0500  Notify: Provider  Provider Name/Title Sharion Settler  Date Provider Notified 11/04/20  Time Provider Notified 0501  Notification Type Face-to-face  Notification Reason Change in status  Provider response At bedside;See new orders  Document  Patient Outcome Stabilized after interventions  Progress note created (see row info) Yes  For [facility] RN

## 2020-11-04 NOTE — Evaluation (Signed)
Clinical/Bedside Swallow Evaluation Patient Details  Name: Lauren [name] MRN: [id number] Date of Birth: January 18, 1928  Today's Date: 11/04/2020 Time: SLP Start Time (ACUTE ONLY): 40 SLP Stop Time (ACUTE ONLY): 1405 SLP Time Calculation (min) (ACUTE ONLY): 45 min  Past Medical History:  Past Medical History:  Diagnosis Date   Breast cancer ([place])    left breast   Chronic constipation    Chronic headaches    Diabetes mellitus    Fibrocystic breast    Gastritis    Gout    injection in the past   Hyperlipidemia    Melanoma in situ of left upper extremity ([place])    Pelvic fracture ([facility])    TIA (transient ischemic attack) 08/2008   neg MRI/MRA and CT and carotid dopplers   Venous insufficiency    Past Surgical History:  Past Surgical History:  Procedure Laterality Date   APPENDECTOMY     ARM SKIN LESION BIOPSY / EXCISION Right 11/02/2016   BREAST BIOPSY Left 10/18/2007   BREAST CYST ASPIRATION     BREAST EXCISIONAL BIOPSY Right    x2   BREAST LUMPECTOMY  09/2007   with sentinel LN biopsy   BREAST LUMPECTOMY     left   EYE SURGERY     retinal, then cataract   INTRAMEDULLARY (IM) NAIL INTERTROCHANTERIC Right 11/02/2020   Procedure: INTRAMEDULLARY (IM) NAIL INTERTROCHANTRIC;  Surgeon: Corky Mull, MD;  Location: [facility] ORS;  Service: Orthopedics;  Laterality: Right;   HPI:  Per admitting H&P "Lauren [name] is a 87 y.o. female with medical history significant for dyslipidemia, TIA, diabetes mellitus and left breast cancer who was brought into the emergency room by EMS after she sustained a mechanical fall landing on her right hip.  Patient ambulates with a rolling walker and had gone outside.  She states that her legs gave way and she fell landing on her right hip.  She denied feeling dizzy or lightheaded and did not lose consciousness.  She did not hit her head when she fell.  She denies having any chest pain, no shortness of breath, no nausea, no vomiting, no palpitations, no  diaphoresis, no lower extremity swelling, no changes in her bowel habits, no fever, no chills, no urinary symptoms"   Assessment / Plan / Recommendation Clinical Impression  Pt presents with mild to moderate oral dysphagia but no overt s/s of aspiration with any tested consistency. Oral mech exam revealed overall oral motor weakenss and some missing dentition. Pt apparently had an episode of decreased responsiveness and diffculty communicating early this am warranting a CODE STROKE call. CT scan was negative and MRI results are pending. She has since become alert and communicative. She was able to repond to questions and follow directions but occasionally needed repetition before verbally responding. Pt was able to tolerate purees, water and solids with no s/s of aspiration. Oral transit delay with the applesauce as well as the solids. Rec Dys 2 chopped diet for now. ST to follow up with toleration of diet and alter as needed.Pt will need to be assisted with all meals. Meds crushed in applesauce. SLP Visit Diagnosis: Dysphagia, oropharyngeal phase (R13.12)    Aspiration Risk  Mild aspiration risk;Moderate aspiration risk    Diet Recommendation Dysphagia 2 (Fine chop)   Liquid Administration via: Straw Medication Administration: Crushed with puree Supervision: Staff to assist with self feeding Compensations: Minimize environmental distractions;Slow rate;Small sips/bites Postural Changes: Seated upright at 90 degrees;Remain upright for at least 30 minutes after po  intake    Other  Recommendations     Follow up Recommendations Skilled Nursing facility      Frequency and Duration min 2x/week  1 week       Prognosis Prognosis for Safe Diet Advancement: Guarded      Swallow Study   General Date of Onset: 11/01/20 HPI: Per admitting H&P "JOZEY [name] is a 87 y.o. female with medical history significant for dyslipidemia, TIA, diabetes mellitus and left breast cancer who was brought into  the emergency room by EMS after she sustained a mechanical fall landing on her right hip.  Patient ambulates with a rolling walker and had gone outside.  She states that her legs gave way and she fell landing on her right hip.  She denied feeling dizzy or lightheaded and did not lose consciousness.  She did not hit her head when she fell.  She denies having any chest pain, no shortness of breath, no nausea, no vomiting, no palpitations, no diaphoresis, no lower extremity swelling, no changes in her bowel habits, no fever, no chills, no urinary symptoms" Type of Study: Bedside Swallow Evaluation Previous Swallow Assessment: None Diet Prior to this Study: NPO Temperature Spikes Noted: No Respiratory Status: Room air History of Recent Intubation: No Behavior/Cognition: Cooperative;Pleasant mood Oral Cavity Assessment: Within Functional Limits Oral Care Completed by SLP: No Oral Cavity - Dentition: Adequate natural dentition Self-Feeding Abilities: Needs assist;Needs set up Patient Positioning: Upright in bed Baseline Vocal Quality: Low vocal intensity    Oral/Motor/Sensory Function Overall Oral Motor/Sensory Function: Within functional limits   Ice Chips Ice chips: Within functional limits Presentation: Spoon   Thin Liquid Thin Liquid: Within functional limits Presentation: Cup;Spoon;Straw    Nectar Thick Nectar Thick Liquid: Not tested   Honey Thick Honey Thick Liquid: Not tested   Puree Puree: Impaired Presentation: Spoon Oral Phase Functional Implications: Prolonged oral transit   Solid     Solid: Impaired Oral Phase Impairments: Impaired mastication Oral Phase Functional Implications: Prolonged oral transit;Oral residue;Impaired mastication      Lucila Maine 11/04/2020,2:20 PM

## 2020-11-04 NOTE — Consult Note (Signed)
Neurology Consultation Reason for Consult: AMS Requesting Physician: Cordelia Poche  CC: Lethargy and paucity of speech   History is obtained from: Chart review, nursing and primary team   HPI: Lauren [name] is a 87 y.o. female with a past medical history significant for stroke and TIAs, headaches, tremors, hyperlipidemia, hypothyroidism, remote left breast cancer, left upper extremity melanoma.  Here she presented on 11/01/2020 after sustaining mechanical fall landing on her right hip while ambulating with her rolling walker, now s/p intramedullary nailing of the displaced in intertrochanteric right hip fracture on 6/11.  She was progressing well yesterday 6/12, walking with PT and communicating at her baseline (mild difficulty "getting words out").  Nursing last spoke to her before she went to bed last night around 8 PM and her speech was at baseline.  At 4 AM she was noted to be very lethargic and nonverbal.  Head CT was obtained which is negative for acute intracranial process.  There was a suspicion of atrial fibrillation as well given variable heart rate with tachycardia though EKG showed sinus rhythm.  Neurology was consulted for altered mental status  She was last seen by Dr. Rexene Alberts, neurology, for outpatient follow-up in March 2019.  At that time she was noted to have a slight head/jaw tremor as well as intermittent tremor of the bilateral upper extremities resting and with posture and action with mild global impairment of fine motor skills and paucity of speech but no aphasia.  She was walking without a cane at the time.  Per Dr. Casimer Leek notes she has had multiple episodes of problems concentrating and getting her words out with her discharge diagnosis in October 2017 TIA versus migraine variant (with a similar episode in 2014 as well).  Additionally 08/15/2016 she had left-sided weakness including a left facial droop with acute right anterior cerebral artery infarct primarily in the corpus  callosum.  Loop recorder was declined by the patient  Regarding opiate medications she received a single dose of tramadol 50 mg at 9:13 AM on 6/12 but otherwise has not received any opiates in the last 24 hours  LKW: 8 PM on 6/12 tPA given?: No, out of the time window Premorbid modified rankin scale:      0 - No symptoms.     1 - No significant disability. Able to carry out all usual activities, despite some symptoms.     2 - Slight disability. Able to look after own affairs without assistance, but unable to carry out all previous activities.     3 - Moderate disability. Requires some help, but able to walk unassisted.     4 - Moderately severe disability. Unable to attend to own bodily needs without assistance, and unable to walk unassisted.     5 - Severe disability. Requires constant nursing care and attention, bedridden, incontinent.     6 - Dead.  ROS: Unable to obtain due to altered mental status.   Past Medical History:  Diagnosis Date   Breast cancer ([place])    left breast   Chronic constipation    Chronic headaches    Diabetes mellitus    Fibrocystic breast    Gastritis    Gout    injection in the past   Hyperlipidemia    Melanoma in situ of left upper extremity (HCC)    Pelvic fracture (HCC)    TIA (transient ischemic attack) 08/2008   neg MRI/MRA and CT and carotid dopplers   Venous insufficiency  Past Surgical History:  Procedure Laterality Date   APPENDECTOMY     ARM SKIN LESION BIOPSY / EXCISION Right 11/02/2016   BREAST BIOPSY Left 10/18/2007   BREAST CYST ASPIRATION     BREAST EXCISIONAL BIOPSY Right    x2   BREAST LUMPECTOMY  09/2007   with sentinel LN biopsy   BREAST LUMPECTOMY     left   EYE SURGERY     retinal, then cataract  -Right hip surgery 11/03/2020  Current Outpatient Medications  Medication Instructions   acetaminophen (TYLENOL) 650 mg, Oral, Every 6 hours PRN   Align 4 mg, Oral, Daily   alum & mag hydroxide-simeth (MAALOX/MYLANTA)  200-200-20 MG/5ML suspension 30 mLs, Oral, Every 6 hours PRN   Blood Glucose Monitoring Suppl (FREESTYLE LITE) DEVI USE AS DIRECTED TO CHECK BLOOD SUGAR ONCE A DAY AND AS NEEDED (DX. E11.9)   citalopram (CELEXA) 5 mg, Oral, Daily   clopidogrel (PLAVIX) 75 MG tablet TAKE ONE TABLET BY MOUTH EVERY MORNING WITH BREAKFAST.   dicyclomine (BENTYL) 10 mg, Oral, 2 times daily   docusate sodium (COLACE) 100 mg, Oral, Daily   fluticasone (FLONASE) 50 MCG/ACT nasal spray 1 spray, Each Nare, Daily   gabapentin (NEURONTIN) 100 mg, Oral, Daily at bedtime   glucose blood (FREESTYLE LITE) test strip USE AS DIRECTED TO CHECK BLOOD SUGAR ONCE A DAY AND AS NEEDED (DX. E11.9)   Lancets (FREESTYLE) lancets USE AS DIRECTED TO CHECK BLOOD SUGAR ONCE A DAY AND AS NEEDED (DX. E11.9)   levothyroxine (SYNTHROID) 50 mcg, Oral, Daily before breakfast, 30 minutes before other medicines or supplements   metFORMIN (GLUCOPHAGE) 250 mg, Oral, 2 times daily with meals   Multiple Vitamins-Minerals (PRESERVISION AREDS 2 PO) 1 tablet, Oral, 2 times daily   omeprazole (PRILOSEC) 20 mg, Daily   oxybutynin (DITROPAN-XL) 5 mg, Oral, Daily   pantoprazole (PROTONIX) 40 mg, Oral, Daily   polyethylene glycol powder (GLYCOLAX/MIRALAX) powder DISSOLVE ONE (1) CAPFUL (17GM) INTO 4 TO8 OUNCES OF FLUID AND TAKE BYMOUTH ONCE DAILY AS DIRECTED   polyvinyl alcohol (LIQUIFILM TEARS) 1.4 % ophthalmic solution 1 drop, Both Eyes, 2 times daily   sennosides-docusate sodium (SENOKOT-S) 8.6-50 MG tablet 1 tablet, Oral, Daily PRN   simvastatin (ZOCOR) 20 mg, Oral, Daily at bedtime   sulfamethoxazole-trimethoprim (BACTRIM DS) 800-160 MG tablet 1 tablet, Oral, 2 times daily   vitamin B-12 (CYANOCOBALAMIN) 500 mcg, Oral, Daily   Vitamin D3 50,000 Units, Oral, Every 30 days    Current Facility-Administered Medications:    0.9 %  sodium chloride infusion, , Intravenous, PRN, Leslye Peer, Richard, MD, Last Rate: 10 mL/hr at 11/03/20 0523, Infusion Verify at  11/03/20 0523   0.9 %  sodium chloride infusion, , Intravenous, Continuous, Chandrea Zellman L, MD   acetaminophen (TYLENOL) tablet 325-650 mg, 325-650 mg, Oral, Q6H PRN, Poggi, Marshall Cork, MD, 650 mg at 11/03/20 2150   bisacodyl (DULCOLAX) suppository 10 mg, 10 mg, Rectal, Daily PRN, Poggi, Marshall Cork, MD   citalopram (CELEXA) tablet 5 mg, 5 mg, Oral, Daily, Poggi, Marshall Cork, MD, 5 mg at 11/03/20 3474   clopidogrel (PLAVIX) tablet 75 mg, 75 mg, Oral, Daily, Wieting, Richard, MD, 75 mg at 11/03/20 1211   dicyclomine (BENTYL) capsule 10 mg, 10 mg, Oral, BID, Poggi, Marshall Cork, MD, 10 mg at 11/03/20 2151   docusate sodium (COLACE) capsule 100 mg, 100 mg, Oral, BID, Poggi, Marshall Cork, MD, 100 mg at 11/03/20 2150   enoxaparin (LOVENOX) injection 40 mg, 40 mg, Subcutaneous, Q24H, Poggi,  Marshall Cork, MD, 40 mg at 11/03/20 0913   feeding supplement (ENSURE ENLIVE / ENSURE PLUS) liquid 237 mL, 237 mL, Oral, BID BM, Poggi, Marshall Cork, MD, 237 mL at 11/03/20 1419   ferrous [id number] C-folic acid (TRINSICON / FOLTRIN) capsule 1 capsule, 1 capsule, Oral, BID, Duanne Guess, PA-C, 1 capsule at 11/03/20 2154   insulin aspart (novoLOG) injection 0-5 Units, 0-5 Units, Subcutaneous, QHS, Wieting, Richard, MD   insulin aspart (novoLOG) injection 0-9 Units, 0-9 Units, Subcutaneous, TID WC, Thompson, Amy C, RPH, 7 Units at 11/03/20 1737   ipratropium-albuterol (DUONEB) 0.5-2.5 (3) MG/3ML nebulizer solution 3 mL, 3 mL, Nebulization, Q6H PRN, Leslye Peer, Richard, MD   levothyroxine (SYNTHROID) tablet 50 mcg, 50 mcg, Oral, Q0600, Poggi, Marshall Cork, MD, 50 mcg at 11/03/20 0531   magnesium hydroxide (MILK OF MAGNESIA) suspension 30 mL, 30 mL, Oral, Daily PRN, Poggi, Marshall Cork, MD   methocarbamol (ROBAXIN) tablet 500 mg, 500 mg, Oral, Q6H PRN **OR** methocarbamol (ROBAXIN) 500 mg in dextrose 5 % 50 mL IVPB, 500 mg, Intravenous, Q6H PRN, Poggi, Marshall Cork, MD   metoprolol succinate (TOPROL-XL) 24 hr tablet 25 mg, 25 mg, Oral, Daily, Wieting, Richard,  MD   metoprolol tartrate (LOPRESSOR) injection 5 mg, 5 mg, Intravenous, Q1H PRN, Leslye Peer, Richard, MD   multivitamin with minerals tablet 1 tablet, 1 tablet, Oral, Daily, Poggi, Marshall Cork, MD, 1 tablet at 11/03/20 0913   multivitamin-lutein (OCUVITE-LUTEIN) capsule 1 capsule, 1 capsule, Oral, BID, Poggi, Marshall Cork, MD, 1 capsule at 11/03/20 2150   mupirocin ointment (BACTROBAN) 2 % 1 application, 1 application, Nasal, BID, Poggi, Marshall Cork, MD, 1 application at 92/42/68 2152   naloxone [facility]) injection 0.4 mg, 0.4 mg, Intravenous, PRN, Leslye Peer, Richard, MD, 0.4 mg at 11/04/20 0827   ondansetron (ZOFRAN) tablet 4 mg, 4 mg, Oral, Q6H PRN **OR** ondansetron (ZOFRAN) injection 4 mg, 4 mg, Intravenous, Q6H PRN, Poggi, Marshall Cork, MD, 4 mg at 11/02/20 1231   pantoprazole (PROTONIX) EC tablet 40 mg, 40 mg, Oral, Daily, Poggi, Marshall Cork, MD, 40 mg at 11/03/20 0913   polyethylene glycol (MIRALAX / GLYCOLAX) packet 17 g, 17 g, Oral, Daily, Poggi, Marshall Cork, MD, 17 g at 11/03/20 0914   polyvinyl alcohol (LIQUIFILM TEARS) 1.4 % ophthalmic solution 1 drop, 1 drop, Both Eyes, BID, Poggi, Marshall Cork, MD, 1 drop at 11/03/20 2152   senna-docusate (Senokot-S) tablet 1 tablet, 1 tablet, Oral, Daily PRN, Poggi, Marshall Cork, MD   simvastatin (ZOCOR) tablet 20 mg, 20 mg, Oral, QHS, Poggi, Marshall Cork, MD, 20 mg at 11/03/20 2150   sodium phosphate (FLEET) 7-19 GM/118ML enema 1 enema, 1 enema, Rectal, Once PRN, Poggi, Marshall Cork, MD   vitamin B-12 (CYANOCOBALAMIN) tablet 500 mcg, 500 mcg, Oral, Daily, Poggi, Marshall Cork, MD, 500 mcg at 11/03/20 3419   Vitamin D (Ergocalciferol) (DRISDOL) capsule 50,000 Units, 50,000 Units, Oral, Q30 days, Poggi, Marshall Cork, MD    Family History  Problem Relation Age of Onset   Breast cancer Sister    Stroke Sister    Cancer Sister        breast   Breast cancer Sister    Stroke Sister    Cancer Sister        breast   Stroke Mother    Stroke Father    Stroke Brother    Cancer Brother        prostate   Stroke Brother     Stroke Sister    CAD Sister  Social History:  reports that she has never smoked. She has never used smokeless tobacco. She reports that she does not drink alcohol and does not use drugs.   Exam: Current vital signs: BP (!) 135/93 (BP Location: Left Arm)   Pulse 88   Temp 98.7 F (37.1 C) (Oral)   Resp 18   SpO2 99%  Vital signs in last 24 hours: Temp:  [97.7 F (36.5 C)-100.3 F (37.9 C)] 98.7 F (37.1 C) (06/13 0700) Pulse Rate:  [58-110] 88 (06/13 0700) Resp:  [18-22] 18 (06/13 0700) BP: (120-157)/(51-93) 135/93 (06/13 0700) SpO2:  [90 %-100 %] 99 % (06/13 0700)   Physical Exam  Constitutional: Appears frail  Psych: Minimally interactive but does socially smile at times Eyes: No scleral injection HENT: No oropharyngeal obstruction.  MSK: Significant muscle wasting in the bilateral hands especially Cardiovascular: Tachycardic but regular during my evaluation Respiratory: Effort normal, non-labored breathing GI: Soft.  No distension. There is no tenderness.  Skin: Warm dry and intact visible skin.  Healing right hip fracture surgery site  Neuro: Mental Status: Patient is alert and tracks examiner, orienting to different people in the room.  She intermittently follows commands briskly.  Her only verbal output is stating that her age is 87 years old (only answered the question 1 time of the few different times it was asked) and exclaiming "ow" to noxious stimulation. Cranial Nerves: II: Visual Fields are full to blink to threat. Pupils are equal, round, and reactive to light.  4 to 2 mm III,IV, VI: EOMI with mild ptosis of the left eye.  V: Facial sensation is symmetric to eyelash brush VII: Facial movement is symmetric.  VIII: hearing is intact to voice X: Uvula elevates symmetrically XI: Shoulder shrug is symmetric. XII: tongue is midline without atrophy or fasciculations.  Motor: Tone is normal. Bulk is reduced throughout especially distally.  She did not  participate in confrontational strength testing but the right upper extremity drifts down to the bed quickly and reproducibly faster than the left upper extremity.  Right lower extremities pain limited secondary to recent operation but does have some slight movement.  Left lower extremity moves slightly more briskly but is not antigravity Sensory:  Equally reactive to right noxious stimulation in all 4 extremities Deep Tendon Reflexes: 2+ and symmetric in the brachioradialis, patella, Achilles Plantars: Toes are mute bilaterally.  Cerebellar: Unable to assess secondary to patient's mental status   NIHSS total 12 Score breakdown: One-point for not answering month correctly, 2 points for right upper extremity drift, 3 points for left lower extremity weakness, 3 points for right lower extremity weakness, 2 points for severe aphasia, one-point for mild dysarthria  I have reviewed labs in epic and the results pertinent to this consultation are: Glucose 149 creatinine 1.01 -> 0.61 today Leukocytosis from 15.9 on 6/12 to 14.2 today Hemoglobin stable at 8.2 Platelets stable at 149  I have personally reviewed the images obtained: Head CT without acute intracranial process though there is some chronic microvascular disease as well as evidence of prior strokes as detailed in the radiology report CTA without large vessel occlusion and perfusion without tissue at risk infarction though there is multifocal intracranial atherosclerosis progressive from prior scan.  Please see radiology report for full details as well as details on incidental aneurysm versus infundibulum and pulmonary nodules  Impression: On my evaluation at 8:15 AM she additionally had right upper extremity greater than left upper extremity weakness and was following commands briskly but  not producing nearly any verbal speech concerning for potential superior division of the MCA occlusion.  She was taken for CTA/CTV to rule out large vessel  occlusion.  Recommendations: # Altered mental status -Most likely hypoactive delirium in the setting of hospitalization and hip fracture -CTA/CTP obtained on my recommendations to exclude large vessel occlusion amenable to intervention -MRI brain without contrast to exclude infarct when able (ordered) -TSH and B12 to rule out reversible causes that may be contributing -Agree with telemetry monitoring given concern for possible atrial fibrillation  -Hold on [facility] pending MRI brain to confirm no acute infarct, also defer to surgery about safety from perspective of recent surgery -Full stroke work-up only if MRI brain is positive -Continue excellent supportive care per primary team  # Incidental aneursym vs. Infundibulum - Repeat vessel imaging in 3-6 months to assess for stabiltiy  # Pulmonary nodules, incidental findings  - Follow-up CT chest 3 months  Lesleigh Noe MD-PhD [facility] [phone] [facility] coverage for [facility] is from 8 AM to 4 AM in-house and 4 PM to 8 PM by telephone/video. 8 PM to 8 AM emergent questions or overnight urgent questions should be addressed to Teleneurology On-call or [facility] neurohospitalist; contact information can be found on AMION

## 2020-11-04 NOTE — Progress Notes (Signed)
   Subjective: 2 Days Post-Op Procedure(s) (LRB): INTRAMEDULLARY (IM) NAIL INTERTROCHANTRIC (Right) Patient is non-verbal this AM. Mental status changes, CT head yesterday was negative for acute changes. Does not give a pain score this AM. HR upon entering the room in the 170's  Objective: Vital signs in last 24 hours: Temp:  [97.7 F (36.5 C)-100.3 F (37.9 C)] 98.7 F (37.1 C) (06/13 0700) Pulse Rate:  [58-110] 88 (06/13 0700) Resp:  [18-22] 18 (06/13 0700) BP: (120-157)/(51-93) 135/93 (06/13 0700) SpO2:  [90 %-100 %] 99 % (06/13 0700)  Intake/Output from previous day: 06/12 0701 - 06/13 0700 In: 368.2 [P.O.:180; I.V.:41.5; IV Piggyback:146.7] Out: 150 [Urine:150] Intake/Output this shift: No intake/output data recorded.  Recent Labs    11/01/20 1240 11/02/20 0439 11/03/20 0454 11/04/20 0456  HGB 11.3* 10.9* 8.3* 8.2*   Recent Labs    11/03/20 0454 11/04/20 0456  WBC 15.9* 14.2*  RBC 2.76* 2.72*  HCT 24.8* 24.6*  PLT 155 149*   Recent Labs    11/03/20 0454 11/04/20 0456  NA 130* 132*  K 4.1 4.1  CL 95* 97*  CO2 30 30  BUN 15 15  CREATININE 1.01* 0.61  GLUCOSE 155* 156*  CALCIUM 8.5* 8.5*   No results for input(s): LABPT, INR in the last 72 hours.  EXAM General - Patient is Lacking Chest: Patient in A. fib Extremity - Intact pulses distally Incision: dressing C/D/I and no drainage No cellulitis present Compartment soft Dressing - dressing C/D/I and no drainage Motor Function - Does not respond to verbal que's, is moving legs on her own this AM.  Past Medical History:  Diagnosis Date   Breast cancer ([place])    left breast   Chronic constipation    Chronic headaches    Diabetes mellitus    Fibrocystic breast    Gastritis    Gout    injection in the past   Hyperlipidemia    Melanoma in situ of left upper extremity (HCC)    Pelvic fracture (HCC)    TIA (transient ischemic attack) 08/2008   neg MRI/MRA and CT and carotid dopplers   Venous  insufficiency    Assessment/Plan:   2 Days Post-Op Procedure(s) (LRB): INTRAMEDULLARY (IM) NAIL INTERTROCHANTRIC (Right) Principal Problem:   Closed intertrochanteric fracture of right hip (HCC) Active Problems:   Diabetes mellitus (HCC)   GERD (gastroesophageal reflux disease)   Hx of completed stroke   HTN (hypertension)   Hypothyroid   Hemiparesis due to recent stroke [facility])   Lethargy   Closed hip fracture requiring operative repair, right, sequela   Wheeze   Depression   AKI (acute kidney injury) ([place])   Drop in hemoglobin  Estimated body mass index is 21.61 kg/m as calculated from the following:   Height as of 10/01/20: 5\' 3"  (1.6 m).   Weight as of 10/01/20: 55.3 kg.  Patient non-verbal this AM, mental status changes. CT of head negative. Hold narcotics at this time. Patient in A. Fib this AM.  EKG ordered.  Internal medicine informed. Up with therapy is able today. Continue to work on BM.  DVT Prophylaxis - Lovenox, Foot Pumps, TED hose, and Plavix Weight-Bearing as tolerated to right leg  J. Cameron Proud, PA-C [place] 11/04/2020, 7:58 AM

## 2020-11-04 NOTE — Progress Notes (Signed)
RN attempted straight cath to obtain urine sample for UA X 2 without urine return. Will attempt again later in shift.

## 2020-11-04 NOTE — Progress Notes (Signed)
RT at bedside to assess necessity of ABG at this time. Pts breathing has slowed and O2sat is stable on 3L O2. Dr. Leslye Peer notified and ABG held at this time. This RN will continue to monitor and follow up with MD and RT as needed.

## 2020-11-04 NOTE — Progress Notes (Signed)
attached pt concern about change in neuro status  pt is more lethargic wakes spontaneously responds to name by opening eyes, but no verbal response. Baseline pt is disoriented  time, no facial twisting, and generalized weakness. vital signs stable. blood glucose done 157.  please review for any change in management. Plan for CT.

## 2020-11-04 NOTE — Progress Notes (Signed)
   11/04/20 0900  Clinical Encounter Type  Visited With Patient  Visit Type Initial  Referral From Nurse  Consult/Referral To Chaplain  Spiritual Encounters  Spiritual Needs Emotional;Prayer  Chaplain Quinnie Barcelo visited room 1A-138A Pt, Ms. Lauren [name]. Pt was resting, I provided a ministry of presence and a silent prayer.

## 2020-11-05 ENCOUNTER — Inpatient Hospital Stay
Admit: 2020-11-05 | Discharge: 2020-11-05 | Disposition: A | Discharge status: Home / Self Care | Payer: Medicare Other | Attending: Internal Medicine | Admitting: Internal Medicine

## 2020-11-05 DIAGNOSIS — I6389 Other cerebral infarction: Secondary | ICD-10-CM

## 2020-11-05 DIAGNOSIS — D62 Acute posthemorrhagic anemia: Secondary | ICD-10-CM

## 2020-11-05 DIAGNOSIS — I480 Paroxysmal atrial fibrillation: Secondary | ICD-10-CM

## 2020-11-05 DIAGNOSIS — I63113 Cerebral infarction due to embolism of bilateral vertebral arteries: Secondary | ICD-10-CM

## 2020-11-05 DIAGNOSIS — R918 Other nonspecific abnormal finding of lung field: Secondary | ICD-10-CM

## 2020-11-05 LAB — ECHOCARDIOGRAM COMPLETE
AR max vel: 1.5 cm2
AV Area VTI: 2.31 cm2
AV Area mean vel: 1.52 cm2
AV Mean grad: 6.5 mmHg
AV Peak grad: 11.7 mmHg
Ao pk vel: 1.71 m/s
Area-P 1/2: 3 cm2
S' Lateral: 1.9 cm

## 2020-11-05 LAB — CBC
HCT: 22.3 % — ABNORMAL LOW (ref 36.0–46.0)
Hemoglobin: 7.4 g/dL — ABNORMAL LOW (ref 12.0–15.0)
MCH: 29.8 pg (ref 26.0–34.0)
MCHC: 33.2 g/dL (ref 30.0–36.0)
MCV: 89.9 fL (ref 80.0–100.0)
Platelets: 174 10*3/uL (ref 150–400)
RBC: 2.48 MIL/uL — ABNORMAL LOW (ref 3.87–5.11)
RDW: 14.1 % (ref 11.5–15.5)
WBC: 13.1 10*3/uL — ABNORMAL HIGH (ref 4.0–10.5)
nRBC: 0 % (ref 0.0–0.2)

## 2020-11-05 LAB — BASIC METABOLIC PANEL
Anion gap: 6 (ref 5–15)
BUN: 12 mg/dL (ref 8–23)
CO2: 30 mmol/L (ref 22–32)
Calcium: 8.5 mg/dL — ABNORMAL LOW (ref 8.9–10.3)
Chloride: 97 mmol/L — ABNORMAL LOW (ref 98–111)
Creatinine, Ser: 0.52 mg/dL (ref 0.44–1.00)
GFR, Estimated: >60 mL/min (ref >60)
Glucose, Bld: 106 mg/dL — ABNORMAL HIGH (ref 70–99)
Potassium: 4 mmol/L (ref 3.5–5.1)
Sodium: 133 mmol/L — ABNORMAL LOW (ref 135–145)

## 2020-11-05 LAB — LIPID PANEL
Cholesterol: 86 mg/dL (ref 0–200)
HDL: 30 mg/dL — ABNORMAL LOW (ref >40)
LDL Cholesterol: 34 mg/dL (ref 0–99)
Total CHOL/HDL Ratio: 2.9 RATIO
Triglycerides: 108 mg/dL (ref <150)
VLDL: 22 mg/dL (ref 0–40)

## 2020-11-05 LAB — GLUCOSE, CAPILLARY
Glucose-Capillary: 142 mg/dL — ABNORMAL HIGH (ref 70–99)
Glucose-Capillary: 202 mg/dL — ABNORMAL HIGH (ref 70–99)
Glucose-Capillary: 199 mg/dL — ABNORMAL HIGH (ref 70–99)
Glucose-Capillary: 257 mg/dL — ABNORMAL HIGH (ref 70–99)

## 2020-11-05 LAB — PREPARE RBC (CROSSMATCH)

## 2020-11-05 MED ORDER — FUROSEMIDE 10 MG/ML IJ SOLN
20.0000 mg | Freq: Once | INTRAMUSCULAR | Status: AC
  Administered 2020-11-05: 20 mg via INTRAVENOUS
  Filled 2020-11-05: qty 4

## 2020-11-05 MED ORDER — ACETAMINOPHEN 325 MG PO TABS
650.0000 mg | ORAL_TABLET | Freq: Once | ORAL | Status: AC
  Administered 2020-11-05: 650 mg via ORAL
  Filled 2020-11-05: qty 2

## 2020-11-05 MED ORDER — OXYCODONE HCL 5 MG PO TABS: 2.5000 mg | ORAL_TABLET | Freq: Four times a day (QID) | ORAL | Status: DC | PRN

## 2020-11-05 MED ORDER — SODIUM CHLORIDE 0.9% IV SOLUTION: Freq: Once | INTRAVENOUS | Status: AC

## 2020-11-05 NOTE — Progress Notes (Signed)
Patient ID: Lauren [name], female   DOB: 1928/05/17, 87 y.o.   MRN: [id number] [facility] PROGRESS NOTE  Lauren [name] PJK:[id number] DOB: Feb 08, 1928 DOA: 11/01/2020 PCP: Pcp, No  HPI/Subjective: Patient able to talk today and communicate with me.  Yesterday was able to follow commands but unable to talk.  MRI showing multiple strokes.  Patient able to eat this morning.  Objective: Vitals:   11/05/20 1433 11/05/20 1609  BP: (!) 130/55 (!) 128/53  Pulse: 79 74  Resp: 20 16  Temp: 98.5 F (36.9 C) 97.9 F (36.6 C)  SpO2: 97% 96%    Intake/Output Summary (Last 24 hours) at 11/05/2020 1622 Last data filed at 11/05/2020 0517 Gross per 24 hour  Intake --  Output 400 ml  Net -400 ml    ROS: Review of Systems  Respiratory:  Negative for shortness of breath.   Cardiovascular:  Negative for chest pain.  Gastrointestinal:  Negative for abdominal pain, nausea and vomiting.  Exam: Physical Exam HENT:     Head: Normocephalic.     Mouth/Throat:     Pharynx: No oropharyngeal exudate.  Eyes:     General: Lids are normal.     Conjunctiva/sclera: Conjunctivae normal.     Pupils: Pupils are equal, round, and reactive to light.  Cardiovascular:     Rate and Rhythm: Normal rate. Rhythm irregularly irregular.     Heart sounds: Normal heart sounds, S1 normal and S2 normal.  Pulmonary:     Breath sounds: Examination of the right-lower field reveals decreased breath sounds. Examination of the left-lower field reveals decreased breath sounds. Decreased breath sounds present. No wheezing, rhonchi or rales.  Abdominal:     Palpations: Abdomen is soft.     Tenderness: There is no abdominal tenderness.  Musculoskeletal:     Right ankle: No swelling.     Left ankle: No swelling.  Skin:    General: Skin is warm.     Findings: No rash.  Neurological:     Mental Status: She is alert.     Data Reviewed: Basic Metabolic Panel: Recent Labs  Lab 11/01/20 1240 11/02/20 0439  11/03/20 0454 11/04/20 0456 11/05/20 0340  NA 136 133* 130* 132* 133*  K 4.1 4.3 4.1 4.1 4.0  CL 104 100 95* 97* 97*  CO2 23 28 30 30 30   GLUCOSE 120* 143* 155* 156* 106*  BUN 12 9 15 15 12   CREATININE 0.69 0.64 1.01* 0.61 0.52  CALCIUM 9.1 8.9 8.5* 8.5* 8.5*  MG  --   --   --  1.9  --    Liver Function Tests: Recent Labs  Lab 11/04/20 0456  AST 15  ALT <5  ALKPHOS 66  BILITOT 0.5  PROT 5.4*  ALBUMIN 2.6*    CBC: Recent Labs  Lab 11/01/20 1240 11/02/20 0439 11/03/20 0454 11/04/20 0456 11/05/20 0340  WBC 11.2* 14.6* 15.9* 14.2* 13.1*  HGB 11.3* 10.9* 8.3* 8.2* 7.4*  HCT 33.5* 32.2* 24.8* 24.6* 22.3*  MCV 88.6 87.7 89.9 90.4 89.9  PLT 210 194 155 149* 174     CBG: Recent Labs  Lab 11/04/20 1237 11/04/20 1732 11/04/20 2114 11/05/20 0748 11/05/20 1130  GLUCAP 146* 264* 102* 142* 202*    Recent Results (from the past 240 hour(s))  Resp Panel by RT-PCR (Flu A&B, Covid) Nasopharyngeal Swab     Status: None   Collection Time: 11/01/20 12:48 PM   Specimen: Nasopharyngeal Swab; Nasopharyngeal(NP) swabs in vial transport medium  Result Value  Ref Range Status   SARS Coronavirus 2 by RT PCR NEGATIVE NEGATIVE Final    Comment: (NOTE) SARS-CoV-2 target nucleic acids are NOT DETECTED.  The SARS-CoV-2 RNA is generally detectable in upper respiratory specimens during the acute phase of infection. The lowest concentration of SARS-CoV-2 viral copies this assay can detect is 138 copies/mL. A negative result does not preclude SARS-Cov-2 infection and should not be used as the sole basis for treatment or other patient management decisions. A negative result may occur with  improper specimen collection/handling, submission of specimen other than nasopharyngeal swab, presence of viral mutation(s) within the areas targeted by this assay, and inadequate number of viral copies(<138 copies/mL). A negative result must be combined with clinical observations, patient history,  and epidemiological information. The expected result is Negative.  Fact Sheet for Patients:  EntrepreneurPulse.com.au  Fact Sheet for Healthcare Providers:  IncredibleEmployment.be  This test is no t yet approved or cleared by the [place] FDA and  has been authorized for detection and/or diagnosis of SARS-CoV-2 by FDA under an Emergency Use Authorization (EUA). This EUA will remain  in effect (meaning this test can be used) for the duration of the COVID-19 declaration under Section 564(b)(1) of the Act, 21 U.S.C.section 360bbb-3(b)(1), unless the authorization is terminated  or revoked sooner.       Influenza A by PCR NEGATIVE NEGATIVE Final   Influenza B by PCR NEGATIVE NEGATIVE Final    Comment: (NOTE) The Xpert Xpress SARS-CoV-2/FLU/RSV plus assay is intended as an aid in the diagnosis of influenza from Nasopharyngeal swab specimens and should not be used as a sole basis for treatment. Nasal washings and aspirates are unacceptable for Xpert Xpress SARS-CoV-2/FLU/RSV testing.  Fact Sheet for Patients: EntrepreneurPulse.com.au  Fact Sheet for Healthcare Providers: IncredibleEmployment.be  This test is not yet approved or cleared by the [place] FDA and has been authorized for detection and/or diagnosis of SARS-CoV-2 by FDA under an Emergency Use Authorization (EUA). This EUA will remain in effect (meaning this test can be used) for the duration of the COVID-19 declaration under Section 564(b)(1) of the Act, 21 U.S.C. section 360bbb-3(b)(1), unless the authorization is terminated or revoked.  Performed at [facility], [address], [place], [place] [address]   Surgical PCR screen     Status: Abnormal   Collection Time: 11/02/20  5:53 AM   Specimen: Nasal Mucosa; Nasal Swab  Result Value Ref Range Status   MRSA, PCR POSITIVE (A) NEGATIVE Final    Comment: RESULT CALLED TO,  READ BACK BY AND VERIFIED WITH: BREANNA CAMPBELL ON 11/02/20 AT 0730 QSD    Staphylococcus aureus POSITIVE (A) NEGATIVE Final    Comment: (NOTE) The Xpert SA Assay (FDA approved for NASAL specimens in patients 53 years of age and older), is one component of a comprehensive surveillance program. It is not intended to diagnose infection nor to guide or monitor treatment. Performed at [facility], [address] [place], [place] [address]      Studies: CT HEAD WO CONTRAST  Result Date: 11/04/2020 CLINICAL DATA:  Mental status change with unknown cause EXAM: CT HEAD WITHOUT CONTRAST TECHNIQUE: Contiguous axial images were obtained from the base of the skull through the vertex without intravenous contrast. COMPARISON:  Brain MRI 08/15/2016 FINDINGS: Brain: No evidence of acute infarction, hemorrhage, hydrocephalus, extra-axial collection or mass lesion/mass effect. Chronic small vessel ischemia in the deep cerebral white matter. Chronic lacunar infarct in the left corona radiata. Vascular: No hyperdense vessel or  unexpected calcification. Skull: Normal. Negative for fracture or focal lesion. Sinuses/Orbits: No acute finding. IMPRESSION: 1. No acute or reversible finding. 2. Chronic small vessel disease. Electronically Signed   By: Monte Fantasia M.D.   On: 11/04/2020 05:47   MR BRAIN WO CONTRAST  Result Date: 11/04/2020 CLINICAL DATA:  Code stroke follow-up EXAM: MRI HEAD WITHOUT CONTRAST TECHNIQUE: Multiplanar, multiecho pulse sequences of the brain and surrounding structures were obtained without intravenous contrast. COMPARISON:  08/15/2016 FINDINGS: Brain: Minimal punctate foci of diffusion hyperintensity are present in the parasagittal left frontal lobe, superolateral left thalamus, and bilateral occipital lobes. There are new foci of susceptibility hypointensity in the cerebellum, pons, and thalamus likely reflecting chronic microhemorrhages secondary to hypertension. Confluent  areas of T2 hyperintensity are present in the supratentorial greater than pontine white matter and while nonspecific, probably reflect advanced chronic microvascular ischemic changes. Chronic small vessel infarcts of the deep cerebral white matter bilaterally. There is no intracranial mass or mass effect. There is no hydrocephalus or extra-axial fluid collection. Prominence of the ventricles and sulci reflects generalized parenchymal volume loss. Vascular: Major vessel flow voids at the skull base are preserved. Skull and upper cervical spine: Normal marrow signal is preserved. Sinuses/Orbits: Layering secretions in the right sphenoid sinus. Minor mucosal thickening elsewhere. Bilateral lens replacements. Other: Sella is unremarkable. Minor patchy right mastoid fluid opacification. IMPRESSION: Few punctate acute to subacute infarcts involving parasagittal left frontal lobe, superolateral left thalamus, bilateral occipital lobes. New chronic microhemorrhages in a pattern suggesting sequelae of hypertension. Advanced chronic microvascular ischemic changes. Chronic infarcts of the deep cerebral white matter bilaterally. Electronically Signed   By: Macy Mis M.D.   On: 11/04/2020 14:13   ECHOCARDIOGRAM COMPLETE  Result Date: 11/05/2020    ECHOCARDIOGRAM REPORT   Patient Name:   AANVI VOYLES Date of Exam: 11/05/2020 Medical Rec #:  [id number]       Height:       63.0 in Accession #:    [id number]      Weight:       122.0 lb Date of Birth:  11-08-1927       BSA:          1.567 m Patient Age:    70 years        BP:           147/64 mmHg Patient Gender: F               HR:           79 bpm. Exam Location:  [facility] Procedure: 2D Echo, Cardiac Doppler and Color Doppler Indications:     Stroke I63.9  History:         Patient has prior history of Echocardiogram examinations, most                  recent 08/16/2016. TIA; Risk Factors:Diabetes.  Sonographer:     Sherrie Sport RDCS (AE) Referring Phys:  [id number] Loletha Grayer  Diagnosing Phys: Bartholome Bill MD  Sonographer Comments: Suboptimal apical window. IMPRESSIONS  1. Left ventricular ejection fraction, by estimation, is 65 to 70%. The left ventricle has normal function. The left ventricle has no regional wall motion abnormalities. There is moderate left ventricular hypertrophy. Left ventricular diastolic parameters are consistent with Grade I diastolic dysfunction (impaired relaxation).  2. Right ventricular systolic function is normal. The right ventricular size is mildly enlarged.  3. Left atrial size was mildly dilated.  4. Right atrial size was mildly dilated.  5. The mitral valve is grossly normal. Trivial mitral valve regurgitation.  6. Tricuspid valve regurgitation is mild to moderate.  7. The aortic valve is tricuspid. Aortic valve regurgitation is mild. FINDINGS  Left Ventricle: Left ventricular ejection fraction, by estimation, is 65 to 70%. The left ventricle has normal function. The left ventricle has no regional wall motion abnormalities. The left ventricular internal cavity size was normal in size. There is  moderate left ventricular hypertrophy. Left ventricular diastolic parameters are consistent with Grade I diastolic dysfunction (impaired relaxation). Right Ventricle: The right ventricular size is mildly enlarged. No increase in right ventricular wall thickness. Right ventricular systolic function is normal. Left Atrium: Left atrial size was mildly dilated. Right Atrium: Right atrial size was mildly dilated. Pericardium: There is no evidence of pericardial effusion. Mitral Valve: The mitral valve is grossly normal. Trivial mitral valve regurgitation. Tricuspid Valve: The tricuspid valve is grossly normal. Tricuspid valve regurgitation is mild to moderate. Aortic Valve: The aortic valve is tricuspid. Aortic valve regurgitation is mild. Aortic valve mean gradient measures 6.5 mmHg. Aortic valve peak gradient measures 11.7 mmHg. Aortic valve area, by VTI measures 2.31  cm. Pulmonic Valve: The pulmonic valve was not well visualized. Pulmonic valve regurgitation is mild. Aorta: The aortic root is normal in size and structure. IAS/Shunts: The atrial septum is grossly normal.  LEFT VENTRICLE PLAX 2D LVIDd:         3.10 cm  Diastology LVIDs:         1.90 cm  LV e' medial:    4.57 cm/s LV PW:         1.00 cm  LV E/e' medial:  18.1 LV IVS:        0.95 cm  LV e' lateral:   5.55 cm/s LVOT diam:     2.00 cm  LV E/e' lateral: 14.9 LV SV:         64 LV SV Index:   41 LVOT Area:     3.14 cm  RIGHT VENTRICLE RV Basal diam:  3.60 cm RV S prime:     15.00 cm/s TAPSE (M-mode): 3.2 cm LEFT ATRIUM           Index       RIGHT ATRIUM           Index LA diam:      2.70 cm 1.72 cm/m  RA Area:     17.80 cm LA Vol (A2C): 28.1 ml 17.93 ml/m RA Volume:   54.00 ml  34.45 ml/m LA Vol (A4C): 36.1 ml 23.03 ml/m  AORTIC VALVE                    PULMONIC VALVE AV Area (Vmax):    1.50 cm     PV Vmax:        0.63 m/s AV Area (Vmean):   1.52 cm     PV Peak grad:   1.6 mmHg AV Area (VTI):     2.31 cm     RVOT Peak grad: 2 mmHg AV Vmax:           171.00 cm/s AV Vmean:          117.000 cm/s AV VTI:            0.278 m AV Peak Grad:      11.7 mmHg AV Mean Grad:      6.5 mmHg LVOT Vmax:         81.60 cm/s LVOT Vmean:  56.700 cm/s LVOT VTI:          0.204 m LVOT/AV VTI ratio: 0.73  AORTA Ao Root diam: 3.13 cm MITRAL VALVE                TRICUSPID VALVE MV Area (PHT): 3.00 cm     TR Peak grad:   39.9 mmHg MV Decel Time: 253 msec     TR Vmax:        316.00 cm/s MV E velocity: 82.70 cm/s MV A velocity: 124.00 cm/s  SHUNTS MV E/A ratio:  0.67         Systemic VTI:  0.20 m                             Systemic Diam: 2.00 cm Bartholome Bill MD Electronically signed by Bartholome Bill MD Signature Date/Time: 11/05/2020/10:44:40 AM    Final    CT ANGIO HEAD NECK W WO CM W PERF (CODE STROKE)  Result Date: 11/04/2020 CLINICAL DATA:  Neuro deficit, acute stroke suspected. Altered mental status. EXAM: CT ANGIOGRAPHY HEAD  AND NECK CT PERFUSION BRAIN TECHNIQUE: Multidetector CT imaging of the head and neck was performed using the standard protocol during bolus administration of intravenous contrast. Multiplanar CT image reconstructions and MIPs were obtained to evaluate the vascular anatomy. Carotid stenosis measurements (when applicable) are obtained utilizing NASCET criteria, using the distal internal carotid diameter as the denominator. Multiphase CT imaging of the brain was performed following IV bolus contrast injection. Subsequent parametric perfusion maps were calculated using RAPID software. CONTRAST:  143mL OMNIPAQUE IOHEXOL 350 MG/ML SOLN COMPARISON:  Same day CT head.  MRA February 29, 2016. FINDINGS: CT HEAD FINDINGS Brain: No evidence of acute large vascular territory infarct. No acute hemorrhage. Similar chronic microvascular ischemic change in the white matter and lacunar infarcts in the left frontal white matter and potentially bilateral basal ganglia. No mass lesion or abnormal mass effect. Similar generalized atrophy with ex vacuo ventricular dilation. No hydrocephalus. Vascular: See below. Skull: No acute fracture. Sinuses/Orbits: Air-fluid level in the right sphenoid sinus. Other: Mastoid effusions. ASPECTS [facility] Stroke Program Early CT Score) total score (0-10 with 10 being normal): 10. Review of the MIP images confirms the above findings CTA NECK FINDINGS Aortic arch: Predominately calcific atherosclerosis. Great vessel origins are patent. Right carotid system: Predominately calcific atherosclerosis at the carotid bifurcation without greater than 50% stenosis. Tortuous ICA at the skull base. Left carotid system: Mixed noncalcific and calcific atherosclerosis of the carotid bifurcation without greater than 50% stenosis. Vertebral arteries: Codominant. No evidence of dissection, stenosis (50% or greater) or occlusion. Calcific atherosclerosis of the right vertebral artery origin. Skeleton: Diffuse osteopenia.  Severe facet arthropathy at C4-C5 on the right with slight anterolisthesis at this level. Other neck: No acute abnormality. Upper chest: 4 mm pulmonary nodule in the right upper lobe (series 13, image 73). 6 mm pulmonary nodule in the posterolateral left upper lobe (series 13, image 21). 3 mm pulmonary nodule in the lateral left upper lobe (series 13, image 22). Dependent ground-glass opacities, most likely atelectasis. Biapical pleuroparenchymal scarring. Review of the MIP images confirms the above findings CTA HEAD FINDINGS Anterior circulation: Calcific atherosclerosis of bilateral cavernous and paraclinoid ICAs. Nearly 50% stenosis on the right. Severe stenosis of the right M1 MCA (series 13, image 247). Severe stenosis of a distal right M2 MCA branch (series 14, image 137; series 13, image 255). MCAs and ACAs are otherwise patent.  Approximately 2-3 mm outpouching arising from the posteromedial aspect of the left supraclinoid ICA (series 15, image 92 and series 13, image 241), compatible with aneurysm versus infundibulum with vessel too small to see. Right posterior communicating artery infundibulum. Posterior circulation: Bilateral intradural vertebral arteries, basilar artery, and posterior cerebral arteries are patent. Moderate to severe bilateral P2 PCA stenosis (series 13, image 236 and series 14, image 133). No aneurysm. Venous sinuses: As permitted by contrast timing, patent. Small right transverse/sigmoid sinuses. Review of the MIP images confirms the above findings CT Brain Perfusion Findings: ASPECTS: 10 CBF (<30%) Volume: 19mL Perfusion (Tmax>6.0s) volume: 28mL Mismatch Volume: 55mL Infarction Location:Non identified. Other: 14 mL area of mildly increased transit time (Tmax greater than 4 seconds), which correlates with the territory supplied by the severe distal right M2 MCA stenosis described below. IMPRESSION: CT head: 1. No evidence of acute large vascular territory infarct or acute hemorrhage. 2.  Similar chronic microvascular ischemic disease and suspected remote white matter infarcts. 3. Air-fluid level in the right sphenoid sinus. CT perfusion: 1. No core infarct or penumbra detected by RAPID. 2. 14 mL area of mildly increased transit time (Tmax greater than 4 seconds), which correlates with the territory supplied by the severe distal right M2 MCA stenosis described below. MRI could provide more sensitive evaluation for acute infarct in this area if clinically indicated. CTA Head: 1. Severe stenosis of the right M1 MCA. 2. Severe stenosis of a distal right M2 MCA branch. 3. Moderate to severe bilateral P2 PCA stenosis. 4. Bilateral cavernous/paraclinoid ICA atherosclerosis with nearly 50% stenosis on the right. 5. Approximately 2-3 mm outpouching arising from the posteromedial aspect of the left supraclinoid ICA (series 15, image 92 and series 13, image 241), compatible with aneurysm versus infundibulum with vessel too small to see. 6. Approximately 2-3 mm left supraclinoid aneurysm versus infundibulum with vessel too small to see. CTA Neck: 1. Bilateral carotid bifurcation atherosclerosis without significant (greater than 50%) stenosis. 2. Multiple pulmonary nodules in the visualized bilateral upper lobes, described above and measuring 6 mm. Non-contrast chest CT at 3-6 months is recommended to assess stability and further evaluate the remaining lungs. If the nodules are stable at time of repeat CT, then future CT at 18-24 months (from today's scan) is considered optional for low-risk patients, but is recommended for high-risk patients. This recommendation follows the consensus statement: Guidelines for Management of Incidental Pulmonary Nodules Detected on CT Images: From the Fleischner Society 2017; Radiology 2017; 284:228-243. Code stroke imaging results were communicated on 11/04/2020 at 9:40 am to provider Bhagat via telephone, who verbally acknowledged these results. Electronically Signed   By:  Margaretha Sheffield MD   On: 11/04/2020 09:45    Scheduled Meds:  citalopram  5 mg Oral Daily   clopidogrel  75 mg Oral Daily   dicyclomine  10 mg Oral BID   docusate sodium  100 mg Oral BID   enoxaparin (LOVENOX) injection  40 mg Subcutaneous Q24H   feeding supplement  237 mL Oral BID BM   ferrous [id number] C-folic acid  1 capsule Oral BID   insulin aspart  0-5 Units Subcutaneous QHS   insulin aspart  0-9 Units Subcutaneous TID WC   levothyroxine  50 mcg Oral Q0600   metoprolol succinate  25 mg Oral Daily   multivitamin with minerals  1 tablet Oral Daily   multivitamin-lutein  1 capsule Oral BID   mupirocin ointment  1 application Nasal BID   pantoprazole  40 mg Oral  Daily   polyethylene glycol  17 g Oral Daily   polyvinyl alcohol  1 drop Both Eyes BID   simvastatin  20 mg Oral QHS   vitamin B-12  500 mcg Oral Daily   Vitamin D (Ergocalciferol)  50,000 Units Oral Q30 days   Continuous Infusions:  sodium chloride 10 mL/hr at 11/03/20 0523   sodium chloride 40 mL/hr at 11/04/20 1242   methocarbamol (ROBAXIN) IV     Brief history.  Patient admitted 11/01/2020 after fall on having right hip pain found to have a right hip fracture.  Dr. Roland Rack did aReduction and internal fixation of intertrochanteric right hip fracture on 11/02/2020.  Early morning on 11/04/2020 had mental status change and inability to talk.  Initial CT scan was negative.  I consulted neurology and a code stroke was called.  CT angio did not show any stroke but later that day MRI did show multiple strokes in the left parasagittal frontal lobe, superior lateral left thalamus and bilateral occipital lobes.  New chronic microhemorrhages and advanced chronic microvascular ischemic changes.  New onset atrial fibrillation seen on telemetry.  Assessment/Plan:  Multiple embolic strokes seen on MRI.  The patient was placed on Plavix after surgery.  Patient has history of prior stroke and was on Plavix before coming into the  hospital also.  Seen by neurology.  Patient was not a tPA candidate secondary to being well when she went to bed and when she woke up not well.  Neurology may switch to a DOAC tomorrow. Right hip fracture requiring operative repair.  Restart low-dose oxycodone as needed for pain.  Try to switch to Tylenol as quickly as possible. New-onset atrial fibrillation.  Started on low-dose Toprol.  Neurology may end up switching to a DOAC tomorrow.  Echocardiogram shows good ejection fraction. Type 2 diabetes mellitus with hyperlipidemia.  Holding metformin.  Continue simvastatin.  LDL 34. Acute blood loss anemia, drop in hemoglobin.  Hemoglobin drifted down to 7.4.  We will give a unit of packed red blood cells.  Case discussed with niece and patient then agreeable to transfusion.  Lasix before and after blood. Acute kidney injury.  This has improved with IV fluid.  Creatinine went as high as 1.01 and down to 0.52. Lung nodule seen on CT scan of the head and neck.  Will end up needing a CT scan of the chest at some point. Hypothyroidism unspecified on levothyroxine Depression on Celexa Patient is a DNR        Code Status:     Code Status Orders  (From admission, onward)           Start     Ordered   11/01/20 1529  Do not attempt resuscitation (DNR)  Continuous       Question Answer Comment  In the event of cardiac or respiratory ARREST Do not call a "code blue"   In the event of cardiac or respiratory ARREST Do not perform Intubation, CPR, defibrillation or ACLS   In the event of cardiac or respiratory ARREST Use medication by any route, position, wound care, and other measures to relive pain and suffering. May use oxygen, suction and manual treatment of airway obstruction as needed for comfort.   Comments CODE STATUS was discussed with patient's niece at the bedside and she is a DNR.      11/01/20 1528           Code Status History     Date Active Date Inactive Code  Status Order ID  Comments User Context   11/01/2020 1412 11/01/2020 1528 Full Code [id number]  Collier Bullock, MD ED   08/15/2016 1503 08/18/2016 1855 DNR [id number]  Maren Reamer, MD ED   02/28/2016 1319 03/02/2016 2143 Full Code [id number]  Rondel Jumbo, PA-C ED   01/11/2013 1709 01/12/2013 2016 Full Code [id number]  Thurnell Lose, MD Inpatient   10/27/2012 1833 10/28/2012 2111 DNR [id number]  Melton Alar, PA-C Inpatient      Family Communication: Spoke with patient's niece on the phone Disposition Plan: Status is: Inpatient  Dispo: The patient is from: Home              Anticipated d/c is to: Rehab likely in a couple days              Patient currently status post hip repair and yesterday had multiple embolic strokes.   Difficult to place patient.  No.  Consultants: Neurology Orthopedic surgery  Time spent: 27 minutes  [place]

## 2020-11-05 NOTE — Progress Notes (Signed)
*  PRELIMINARY RESULTS* Echocardiogram 2D Echocardiogram has been performed.  Sherrie Sport 11/05/2020, 8:56 AM

## 2020-11-05 NOTE — Progress Notes (Signed)
   Subjective: 3 Days Post-Op Procedure(s) (LRB): INTRAMEDULLARY (IM) NAIL INTERTROCHANTRIC (Right) Patient reports only mild pain this AM. Is more alert compared to yesterday. Mental status changes, neurology consulted. Patient in A fib, currently on Plavix with plan to transition to Eliquis tomorrow.  Objective: Vital signs in last 24 hours: Temp:  [97.5 F (36.4 C)-99.7 F (37.6 C)] 98.5 F (36.9 C) (06/14 1433) Pulse Rate:  [74-114] 79 (06/14 1433) Resp:  [15-20] 20 (06/14 1433) BP: (104-163)/(43-68) 130/55 (06/14 1433) SpO2:  [90 %-100 %] 97 % (06/14 1433)  Intake/Output from previous day: 06/13 0701 - 06/14 0700 In: -  Out: 400 [Urine:400] Intake/Output this shift: No intake/output data recorded.  Recent Labs    11/03/20 0454 11/04/20 0456 11/05/20 0340  HGB 8.3* 8.2* 7.4*   Recent Labs    11/04/20 0456 11/05/20 0340  WBC 14.2* 13.1*  RBC 2.72* 2.48*  HCT 24.6* 22.3*  PLT 149* 174   Recent Labs    11/04/20 0456 11/05/20 0340  NA 132* 133*  K 4.1 4.0  CL 97* 97*  CO2 30 30  BUN 15 12  CREATININE 0.61 0.52  GLUCOSE 156* 106*  CALCIUM 8.5* 8.5*   Recent Labs    11/04/20 0837  INR 1.0    EXAM General - Patient is Alert and Lacking Chest: Patient in A. fib Extremity - Intact pulses distally Incision: dressing C/D/I and no drainage No cellulitis present Compartment soft Dressing - dressing C/D/I and no drainage Motor Function - Is able use and move her bilateral lower extremities.  Past Medical History:  Diagnosis Date   Breast cancer ([place])    left breast   Chronic constipation    Chronic headaches    Diabetes mellitus    Fibrocystic breast    Gastritis    Gout    injection in the past   Hyperlipidemia    Melanoma in situ of left upper extremity (HCC)    Pelvic fracture (HCC)    TIA (transient ischemic attack) 08/2008   neg MRI/MRA and CT and carotid dopplers   Venous insufficiency    Assessment/Plan:   3 Days Post-Op  Procedure(s) (LRB): INTRAMEDULLARY (IM) NAIL INTERTROCHANTRIC (Right) Principal Problem:   Closed intertrochanteric fracture of right hip (HCC) Active Problems:   Type 2 diabetes mellitus with hyperlipidemia (HCC)   GERD (gastroesophageal reflux disease)   Hx of completed stroke   HTN (hypertension)   Hypothyroid   Right sided weakness   Lethargy   Closed hip fracture requiring operative repair, right, sequela   Wheeze   Depression   AKI (acute kidney injury) ([place])   Drop in hemoglobin   Dysarthria  Estimated body mass index is 21.61 kg/m as calculated from the following:   Height as of 10/01/20: 5\' 3"  (1.6 m).   Weight as of 10/01/20: 55.3 kg.  Mental status changes, neurology has been consulted. A fib, transition to Eliquis tomorrow. Tylenol for pain Up with therapy is able today. Continue to work on BM.  DVT Prophylaxis - Lovenox, Foot Pumps, TED hose, and Plavix Weight-Bearing as tolerated to right leg  J. Cameron Proud, PA-C [place] 11/05/2020, 3:47 PM

## 2020-11-05 NOTE — Evaluation (Signed)
Physical Therapy Evaluation Patient Details Name: NEETI KNUDTSON MRN: [id number] DOB: 1928/03/25 Today's Date: 11/05/2020   History of Present Illness  Pt admitted to [facility] on 11/01/20 for mechanical fall that resulted in R hip fx. Pt s/p R ORIF with IM nailing on 11/02/20 by Dr. Roland Rack. Significant PMH includes: depression, HTN, hypothyroidism, gout, gastritis, hx breast CA, hx of CVA with residual weakness, HLD, GERD, and DM.   Clinical Impression  Pt received in Semi-Fowler's position and agreeable to therapy.  Pt is A&O x3, unable to state the date.  Pt currently demonstrated generalized weakness in the B UE's, B LE's with the R side being weaker than the L.  Pt slow to respond to questions at times, but when given simple commands, she is able to perform tasks with good timing.  Pt performed bed level exercises, as noted below, pt is unable to perform some of the exercises on the right side due to weakness.  Pt was able to come upright into seated position with modA and max verbal cueing.  Pt then was able to recall hand placement for coming into standing position.  Pt requires tactile and verbal feedback for upright posture in standing.  Pt was able to stand for ~5 minutes while nursing staff assisted with self-care.  Pt was left with nursing staff seated EOB.  At this time, due to physical deconditioning and weakness, SNF with assistance 24/7 is appropriate at this time.     Follow Up Recommendations SNF    Equipment Recommendations  Other (comment) (defer to post acute)    Recommendations for Other Services       Precautions / Restrictions Precautions Precautions: Fall Restrictions Weight Bearing Restrictions: Yes RLE Weight Bearing: Weight bearing as tolerated      Mobility  Bed Mobility Overal bed mobility: Needs Assistance Bed Mobility: Rolling;Supine to Sit Rolling: Mod assist   Supine to sit: Mod assist     General bed mobility comments: Pt utilized elevated HOB in  order to come upright into seated position and required modA with maximal verbal cuing for technique in coming upright.    Transfers Overall transfer level: Needs assistance Equipment used: Rolling walker (2 wheeled) Transfers: Sit to/from Stand Sit to Stand: Mod assist;From elevated surface         General transfer comment: Mod assist for power to stand from elevated bed height with RW, and for controlled descent to sit EOB. Multimodal cues for hand placement, sequencing, and hip extension for upright posture.  Ambulation/Gait             General Gait Details: Deferred ambulation due to pt reporting fatigue following the exercises and coming upright into standing position.  Stairs            Wheelchair Mobility    Modified Rankin (Stroke Patients Only)       Balance Overall balance assessment: Needs assistance Sitting-balance support: Bilateral upper extremity supported;Feet supported Sitting balance-Leahy Scale: Fair     Standing balance support: Bilateral upper extremity supported;During functional activity Standing balance-Leahy Scale: Poor Standing balance comment: Required min-mod assist standing in RW                             Pertinent Vitals/Pain Pain Assessment: 0-10 Pain Score: 2  Pain Location: R hip with mobility; 0/10 pain at rest Pain Intervention(s): Limited activity within patient's tolerance;Monitored during session;Repositioned    Home Living Family/patient expects to be  discharged to:: Assisted living                 Additional Comments: per facility    Prior Function Level of Independence: Needs assistance   Gait / Transfers Assistance Needed: Mod I with Rollator  ADL's / Homemaking Assistance Needed: Assist for dressing and bathing; unknown if assist needed for toileting  Comments: Pt questionable historian of health due to cognitive deficits from previous CVA. Casandra Doffing, present during subjective  questioning but unable to confirm PLOF.     Hand Dominance   Dominant Hand: Right    Extremity/Trunk Assessment   Upper Extremity Assessment Upper Extremity Assessment: Generalized weakness    Lower Extremity Assessment Lower Extremity Assessment: Generalized weakness    Cervical / Trunk Assessment Cervical / Trunk Assessment: Kyphotic  Communication   Communication: Expressive difficulties;Receptive difficulties (slowed processing)  Cognition                                              General Comments      Exercises Total Joint Exercises Ankle Circles/Pumps: AROM;Strengthening;Both;10 reps;Supine Quad Sets: AROM;Strengthening;Both;10 reps;Supine Gluteal Sets: AROM;Strengthening;Both;10 reps;Supine Short Arc Quad: AROM;Strengthening;Both;10 reps;Supine Hip ABduction/ADduction: AROM;Left;AAROM;Right;Strengthening;10 reps;Supine Straight Leg Raises: AROM;Left;AAROM;Right;Strengthening;10 reps;Supine Other Exercises Other Exercises: Pt educated on role of PT and the services provided during hospital stay.   Assessment/Plan    PT Assessment Patient needs continued PT services  PT Problem List Decreased strength;Decreased mobility;Decreased safety awareness;Decreased activity tolerance;Decreased cognition;Decreased balance;Pain       PT Treatment Interventions DME instruction;Gait training;Functional mobility training;Therapeutic activities;Therapeutic exercise;Balance training;Neuromuscular re-education    PT Goals (Current goals can be found in the Care Plan section)  Acute Rehab PT Goals Patient Stated Goal: "to go home" PT Goal Formulation: With patient Time For Goal Achievement: 11/17/20 Potential to Achieve Goals: Good    Frequency BID   Barriers to discharge        Co-evaluation               AM-PAC PT "6 Clicks" Mobility  Outcome Measure Help needed turning from your back to your side while in a flat bed without using  bedrails?: A Lot Help needed moving from lying on your back to sitting on the side of a flat bed without using bedrails?: Total Help needed moving to and from a bed to a chair (including a wheelchair)?: A Lot Help needed standing up from a chair using your arms (e.g., wheelchair or bedside chair)?: A Lot Help needed to walk in hospital room?: A Lot Help needed climbing 3-5 steps with a railing? : Total 6 Click Score: 10    End of Session Equipment Utilized During Treatment: Gait belt (1.5L O2 via [place]) Activity Tolerance: Patient tolerated treatment well;Patient limited by fatigue Patient left: with nursing/sitter in room;with family/visitor present (pt left in sitting position with nursing staff attempting to perform self-care with pt.) Nurse Communication: Mobility status PT Visit Diagnosis: Unsteadiness on feet (R26.81);Muscle weakness (generalized) (M62.81);Difficulty in walking, not elsewhere classified (R26.2);Pain Pain - Right/Left: Right Pain - part of body: Hip    Time: 1006-1040 PT Time Calculation (min) (ACUTE ONLY): 34 min   Charges:   PT Evaluation $PT Re-evaluation: 1 Re-eval PT Treatments $Therapeutic Exercise: 23-37 mins        Gwenlyn Saran, PT, DPT 11/05/20, 11:20 AM    Christie Nottingham 11/05/2020, 11:01 AM

## 2020-11-06 DIAGNOSIS — I63113 Cerebral infarction due to embolism of bilateral vertebral arteries: Secondary | ICD-10-CM

## 2020-11-06 DIAGNOSIS — S72141A Displaced intertrochanteric fracture of right femur, initial encounter for closed fracture: Principal | ICD-10-CM

## 2020-11-06 LAB — BASIC METABOLIC PANEL
Anion gap: 13 (ref 5–15)
BUN: 13 mg/dL (ref 8–23)
CO2: 30 mmol/L (ref 22–32)
Calcium: 9.1 mg/dL (ref 8.9–10.3)
Chloride: 92 mmol/L — ABNORMAL LOW (ref 98–111)
Creatinine, Ser: 0.48 mg/dL (ref 0.44–1.00)
GFR, Estimated: >60 mL/min (ref >60)
Glucose, Bld: 184 mg/dL — ABNORMAL HIGH (ref 70–99)
Potassium: 3.4 mmol/L — ABNORMAL LOW (ref 3.5–5.1)
Sodium: 135 mmol/L (ref 135–145)

## 2020-11-06 LAB — CBC
HCT: 33.2 % — ABNORMAL LOW (ref 36.0–46.0)
Hemoglobin: 11.1 g/dL — ABNORMAL LOW (ref 12.0–15.0)
MCH: 29.7 pg (ref 26.0–34.0)
MCHC: 33.4 g/dL (ref 30.0–36.0)
MCV: 88.8 fL (ref 80.0–100.0)
Platelets: 247 10*3/uL (ref 150–400)
RBC: 3.74 MIL/uL — ABNORMAL LOW (ref 3.87–5.11)
RDW: 13.9 % (ref 11.5–15.5)
WBC: 16.1 10*3/uL — ABNORMAL HIGH (ref 4.0–10.5)
nRBC: 0 % (ref 0.0–0.2)

## 2020-11-06 LAB — GLUCOSE, CAPILLARY
Glucose-Capillary: 175 mg/dL — ABNORMAL HIGH (ref 70–99)
Glucose-Capillary: 185 mg/dL — ABNORMAL HIGH (ref 70–99)
Glucose-Capillary: 150 mg/dL — ABNORMAL HIGH (ref 70–99)
Glucose-Capillary: 176 mg/dL — ABNORMAL HIGH (ref 70–99)
Glucose-Capillary: 125 mg/dL — ABNORMAL HIGH (ref 70–99)

## 2020-11-06 LAB — BPAM RBC
Blood Product Expiration Date: 202207112359
ISSUE DATE / TIME: 202206141357
Unit Type and Rh: 5100

## 2020-11-06 LAB — TYPE AND SCREEN
ABO/RH(D): O POS
Antibody Screen: NEGATIVE
Unit division: 0

## 2020-11-06 LAB — HEMOGLOBIN A1C
Hgb A1c MFr Bld: 6.7 % — ABNORMAL HIGH (ref 4.8–5.6)
Mean Plasma Glucose: 146 mg/dL

## 2020-11-06 MED ORDER — APIXABAN 2.5 MG PO TABS
2.5000 mg | ORAL_TABLET | Freq: Two times a day (BID) | ORAL | Status: DC
  Administered 2020-11-06 – 2020-11-08 (×4): 2.5 mg via ORAL
  Filled 2020-11-06 (×4): qty 1

## 2020-11-06 NOTE — Progress Notes (Signed)
  Speech Language Pathology Treatment:    Patient Details Name: Lauren [name] MRN: [id number] DOB: 05-02-1928 Today's Date: 11/06/2020 Time: 0092-3300 SLP Time Calculation (min) (ACUTE ONLY): 50 min  Assessment / Plan / Recommendation Clinical Impression  Per chart review, MRI revealed new multifocal strokes secondary to atrial fibrillation. Per RN, he has been crushing meds, pt with oral holding and needing cues to swallow. SLP provided verbal and tactile assist, hand over hand necessary at times, for pt to self-feed puree and thin liquid. For liquids (via cup and straw), oral transfer appears [facility] with timely swallow response. With puree, patient with significant oral holding and distractibility, with moderate cues necessary to initiate swallow. When fed by clinician or self-feeding, pt held boluses orally until bringing the second bolus to her lips, when she finally triggered a swallow. With assistance and verbal cues to swallow, there were no overt signs of aspiration. As she demonstrates poor awareness of bolus and minimal bolus manipulation/chewing efforts, recommend downgrade to dysphagia 1 (puree) and thin liquids wth full supervision; meds crushed. Confusion/delirium appear to contribute, although with new CVA cognitive impairments also likely; SLP cognitive-linguistic evaluation is recommended.     HPI HPI: Per admitting H&P "Lauren [name] is a 87 y.o. female with medical history significant for dyslipidemia, TIA, diabetes mellitus and left breast cancer who was brought into the emergency room by EMS after she sustained a mechanical fall landing on her right hip.  Patient ambulates with a rolling walker and had gone outside.  She states that her legs gave way and she fell landing on her right hip.  She denied feeling dizzy or lightheaded and did not lose consciousness.  She did not hit her head when she fell.  She denies having any chest pain, no shortness of breath, no nausea, no vomiting,  no palpitations, no diaphoresis, no lower extremity swelling, no changes in her bowel habits, no fever, no chills, no urinary symptoms" Code stroke was called on 11/04/20 and MRI showed multiple embolic infarcts.      SLP Plan  Continue with current plan of care       Recommendations  Diet recommendations: Dysphagia 1 (puree);Thin liquid Liquids provided via: Cup Medication Administration: Crushed with puree Supervision: Full supervision/cueing for compensatory strategies Compensations: Minimize environmental distractions;Slow rate;Small sips/bites;Other (Comment) (assist with self-feeding if possible, dry spoon or verbal cue to swallow if holding)                General recommendations: Other(comment) (SLP cognitive linguistic evaluation) Oral Care Recommendations: Oral care BID Follow up Recommendations: Skilled Nursing facility SLP Visit Diagnosis: Dysphagia, oropharyngeal phase (R13.12) Plan: Continue with current plan of care       [place], [place], CCC-SLP Speech-Language Pathologist  Aliene Altes 11/06/2020, 2:21 PM

## 2020-11-06 NOTE — Progress Notes (Signed)
Patient ID: Lauren [name], female   DOB: June 01, 1927, 87 y.o.   MRN: [id number] [facility] PROGRESS NOTE  OMNIA DOLLINGER ZSM:[id number] DOB: 14-Feb-1928 DOA: 11/01/2020 PCP: Pcp, No  HPI  Brief history.  Patient admitted 11/01/2020 after fall on having right hip pain found to have a right hip fracture.  Dr. Roland Rack did aReduction and internal fixation of intertrochanteric right hip fracture on 11/02/2020.  Early morning on 11/04/2020 had mental status change and inability to talk.  Initial CT scan was negative.  I consulted neurology and a code stroke was called.  CT angio did not show any stroke but later that day MRI did show multiple strokes in the left parasagittal frontal lobe, superior lateral left thalamus and bilateral occipital lobes.  New chronic microhemorrhages and advanced chronic microvascular ischemic changes.  New onset atrial fibrillation seen on telemetry.    Subjective:  Today she is communicative, answering questions, denies any complaints, but she has poor appetite.    Objective: Vitals:   11/06/20 0744 11/06/20 1437  BP: (!) 176/94 (!) 130/59  Pulse: 73 87  Resp: 15 18  Temp: 98 F (36.7 C) 97.9 F (36.6 C)  SpO2: 98% 99%    Intake/Output Summary (Last 24 hours) at 11/06/2020 1533 Last data filed at 11/06/2020 0500 Gross per 24 hour  Intake 335 ml  Output 1700 ml  Net -1365 ml      Exam:  Awake, frail, pleasantly confused  Symmetrical Chest wall movement, Good air movement bilaterally, CTAB RRR,No Gallops,Rubs or new Murmurs, No Parasternal Heave +ve B.Sounds, Abd Soft, No tenderness, No rebound - guarding or rigidity. No Cyanosis, Clubbing or edema, No new Rash or bruise     Data Reviewed: Basic Metabolic Panel: Recent Labs  Lab 11/02/20 0439 11/03/20 0454 11/04/20 0456 11/05/20 0340 11/06/20 0804  NA 133* 130* 132* 133* 135  K 4.3 4.1 4.1 4.0 3.4*  CL 100 95* 97* 97* 92*  CO2 28 30 30 30 30   GLUCOSE 143* 155* 156* 106* 184*  BUN 9 15 15  12 13   CREATININE 0.64 1.01* 0.61 0.52 0.48  CALCIUM 8.9 8.5* 8.5* 8.5* 9.1  MG  --   --  1.9  --   --     Liver Function Tests: Recent Labs  Lab 11/04/20 0456  AST 15  ALT <5  ALKPHOS 66  BILITOT 0.5  PROT 5.4*  ALBUMIN 2.6*     CBC: Recent Labs  Lab 11/02/20 0439 11/03/20 0454 11/04/20 0456 11/05/20 0340 11/06/20 0804  WBC 14.6* 15.9* 14.2* 13.1* 16.1*  HGB 10.9* 8.3* 8.2* 7.4* 11.1*  HCT 32.2* 24.8* 24.6* 22.3* 33.2*  MCV 87.7 89.9 90.4 89.9 88.8  PLT 194 155 149* 174 247      CBG: Recent Labs  Lab 11/05/20 1624 11/05/20 2115 11/05/20 2217 11/06/20 0745 11/06/20 1126  GLUCAP 199* 175* 257* 185* 150*     Recent Results (from the past 240 hour(s))  Resp Panel by RT-PCR (Flu A&B, Covid) Nasopharyngeal Swab     Status: None   Collection Time: 11/01/20 12:48 PM   Specimen: Nasopharyngeal Swab; Nasopharyngeal(NP) swabs in vial transport medium  Result Value Ref Range Status   SARS Coronavirus 2 by RT PCR NEGATIVE NEGATIVE Final    Comment: (NOTE) SARS-CoV-2 target nucleic acids are NOT DETECTED.  The SARS-CoV-2 RNA is generally detectable in upper respiratory specimens during the acute phase of infection. The lowest concentration of SARS-CoV-2 viral copies this assay can detect is 138  copies/mL. A negative result does not preclude SARS-Cov-2 infection and should not be used as the sole basis for treatment or other patient management decisions. A negative result may occur with  improper specimen collection/handling, submission of specimen other than nasopharyngeal swab, presence of viral mutation(s) within the areas targeted by this assay, and inadequate number of viral copies(<138 copies/mL). A negative result must be combined with clinical observations, patient history, and epidemiological information. The expected result is Negative.  Fact Sheet for Patients:  EntrepreneurPulse.com.au  Fact Sheet for Healthcare Providers:   IncredibleEmployment.be  This test is no t yet approved or cleared by the [place] FDA and  has been authorized for detection and/or diagnosis of SARS-CoV-2 by FDA under an Emergency Use Authorization (EUA). This EUA will remain  in effect (meaning this test can be used) for the duration of the COVID-19 declaration under Section 564(b)(1) of the Act, 21 U.S.C.section 360bbb-3(b)(1), unless the authorization is terminated  or revoked sooner.       Influenza A by PCR NEGATIVE NEGATIVE Final   Influenza B by PCR NEGATIVE NEGATIVE Final    Comment: (NOTE) The Xpert Xpress SARS-CoV-2/FLU/RSV plus assay is intended as an aid in the diagnosis of influenza from Nasopharyngeal swab specimens and should not be used as a sole basis for treatment. Nasal washings and aspirates are unacceptable for Xpert Xpress SARS-CoV-2/FLU/RSV testing.  Fact Sheet for Patients: EntrepreneurPulse.com.au  Fact Sheet for Healthcare Providers: IncredibleEmployment.be  This test is not yet approved or cleared by the [place] FDA and has been authorized for detection and/or diagnosis of SARS-CoV-2 by FDA under an Emergency Use Authorization (EUA). This EUA will remain in effect (meaning this test can be used) for the duration of the COVID-19 declaration under Section 564(b)(1) of the Act, 21 U.S.C. section 360bbb-3(b)(1), unless the authorization is terminated or revoked.  Performed at [facility], [address], [place], [place] [address]   Surgical PCR screen     Status: Abnormal   Collection Time: 11/02/20  5:53 AM   Specimen: Nasal Mucosa; Nasal Swab  Result Value Ref Range Status   MRSA, PCR POSITIVE (A) NEGATIVE Final    Comment: RESULT CALLED TO, READ BACK BY AND VERIFIED WITH: BREANNA CAMPBELL ON 11/02/20 AT 0730 QSD    Staphylococcus aureus POSITIVE (A) NEGATIVE Final    Comment: (NOTE) The Xpert SA Assay (FDA approved  for NASAL specimens in patients 35 years of age and older), is one component of a comprehensive surveillance program. It is not intended to diagnose infection nor to guide or monitor treatment. Performed at [facility], [address] [place], [place] [address]       Studies: ECHOCARDIOGRAM COMPLETE  Result Date: 11/05/2020    ECHOCARDIOGRAM REPORT   Patient Name:   RASHIKA BETTES Date of Exam: 11/05/2020 Medical Rec #:  [id number]       Height:       63.0 in Accession #:    [id number]      Weight:       122.0 lb Date of Birth:  March 25, 1928       BSA:          1.567 m Patient Age:    88 years        BP:           147/64 mmHg Patient Gender: F               HR:  79 bpm. Exam Location:  [facility] Procedure: 2D Echo, Cardiac Doppler and Color Doppler Indications:     Stroke I63.9  History:         Patient has prior history of Echocardiogram examinations, most                  recent 08/16/2016. TIA; Risk Factors:Diabetes.  Sonographer:     Sherrie Sport RDCS (AE) Referring Phys:  [id number] Loletha Grayer Diagnosing Phys: Bartholome Bill MD  Sonographer Comments: Suboptimal apical window. IMPRESSIONS  1. Left ventricular ejection fraction, by estimation, is 65 to 70%. The left ventricle has normal function. The left ventricle has no regional wall motion abnormalities. There is moderate left ventricular hypertrophy. Left ventricular diastolic parameters are consistent with Grade I diastolic dysfunction (impaired relaxation).  2. Right ventricular systolic function is normal. The right ventricular size is mildly enlarged.  3. Left atrial size was mildly dilated.  4. Right atrial size was mildly dilated.  5. The mitral valve is grossly normal. Trivial mitral valve regurgitation.  6. Tricuspid valve regurgitation is mild to moderate.  7. The aortic valve is tricuspid. Aortic valve regurgitation is mild. FINDINGS  Left Ventricle: Left ventricular ejection fraction, by estimation, is 65 to 70%. The left  ventricle has normal function. The left ventricle has no regional wall motion abnormalities. The left ventricular internal cavity size was normal in size. There is  moderate left ventricular hypertrophy. Left ventricular diastolic parameters are consistent with Grade I diastolic dysfunction (impaired relaxation). Right Ventricle: The right ventricular size is mildly enlarged. No increase in right ventricular wall thickness. Right ventricular systolic function is normal. Left Atrium: Left atrial size was mildly dilated. Right Atrium: Right atrial size was mildly dilated. Pericardium: There is no evidence of pericardial effusion. Mitral Valve: The mitral valve is grossly normal. Trivial mitral valve regurgitation. Tricuspid Valve: The tricuspid valve is grossly normal. Tricuspid valve regurgitation is mild to moderate. Aortic Valve: The aortic valve is tricuspid. Aortic valve regurgitation is mild. Aortic valve mean gradient measures 6.5 mmHg. Aortic valve peak gradient measures 11.7 mmHg. Aortic valve area, by VTI measures 2.31 cm. Pulmonic Valve: The pulmonic valve was not well visualized. Pulmonic valve regurgitation is mild. Aorta: The aortic root is normal in size and structure. IAS/Shunts: The atrial septum is grossly normal.  LEFT VENTRICLE PLAX 2D LVIDd:         3.10 cm  Diastology LVIDs:         1.90 cm  LV e' medial:    4.57 cm/s LV PW:         1.00 cm  LV E/e' medial:  18.1 LV IVS:        0.95 cm  LV e' lateral:   5.55 cm/s LVOT diam:     2.00 cm  LV E/e' lateral: 14.9 LV SV:         64 LV SV Index:   41 LVOT Area:     3.14 cm  RIGHT VENTRICLE RV Basal diam:  3.60 cm RV S prime:     15.00 cm/s TAPSE (M-mode): 3.2 cm LEFT ATRIUM           Index       RIGHT ATRIUM           Index LA diam:      2.70 cm 1.72 cm/m  RA Area:     17.80 cm LA Vol (A2C): 28.1 ml 17.93 ml/m RA Volume:   54.00 ml  34.45 ml/m  LA Vol (A4C): 36.1 ml 23.03 ml/m  AORTIC VALVE                    PULMONIC VALVE AV Area (Vmax):    1.50  cm     PV Vmax:        0.63 m/s AV Area (Vmean):   1.52 cm     PV Peak grad:   1.6 mmHg AV Area (VTI):     2.31 cm     RVOT Peak grad: 2 mmHg AV Vmax:           171.00 cm/s AV Vmean:          117.000 cm/s AV VTI:            0.278 m AV Peak Grad:      11.7 mmHg AV Mean Grad:      6.5 mmHg LVOT Vmax:         81.60 cm/s LVOT Vmean:        56.700 cm/s LVOT VTI:          0.204 m LVOT/AV VTI ratio: 0.73  AORTA Ao Root diam: 3.13 cm MITRAL VALVE                TRICUSPID VALVE MV Area (PHT): 3.00 cm     TR Peak grad:   39.9 mmHg MV Decel Time: 253 msec     TR Vmax:        316.00 cm/s MV E velocity: 82.70 cm/s MV A velocity: 124.00 cm/s  SHUNTS MV E/A ratio:  0.67         Systemic VTI:  0.20 m                             Systemic Diam: 2.00 cm Bartholome Bill MD Electronically signed by Bartholome Bill MD Signature Date/Time: 11/05/2020/10:44:40 AM    Final     Scheduled Meds:  apixaban  2.5 mg Oral BID   citalopram  5 mg Oral Daily   dicyclomine  10 mg Oral BID   docusate sodium  100 mg Oral BID   feeding supplement  237 mL Oral BID BM   ferrous [id number] C-folic acid  1 capsule Oral BID   insulin aspart  0-5 Units Subcutaneous QHS   insulin aspart  0-9 Units Subcutaneous TID WC   levothyroxine  50 mcg Oral Q0600   metoprolol succinate  25 mg Oral Daily   multivitamin with minerals  1 tablet Oral Daily   multivitamin-lutein  1 capsule Oral BID   mupirocin ointment  1 application Nasal BID   pantoprazole  40 mg Oral Daily   polyethylene glycol  17 g Oral Daily   polyvinyl alcohol  1 drop Both Eyes BID   simvastatin  20 mg Oral QHS   vitamin B-12  500 mcg Oral Daily   Vitamin D (Ergocalciferol)  50,000 Units Oral Q30 days   Continuous Infusions:  sodium chloride 10 mL/hr at 11/03/20 0523   methocarbamol (ROBAXIN) IV     Brief history.  Patient admitted 11/01/2020 after fall on having right hip pain found to have a right hip fracture.  Dr. Roland Rack did aReduction and internal fixation of  intertrochanteric right hip fracture on 11/02/2020.  Early morning on 11/04/2020 had mental status change and inability to talk.  Initial CT scan was negative.  I consulted neurology and a code stroke was called.  CT angio  did not show any stroke but later that day MRI did show multiple strokes in the left parasagittal frontal lobe, superior lateral left thalamus and bilateral occipital lobes.  New chronic microhemorrhages and advanced chronic microvascular ischemic changes.  New onset atrial fibrillation seen on telemetry.  Assessment/Plan:  Multiple embolic strokes seen on MRI.  Juliane but greatly appreciated, in the setting of new onset A. fib, recommendation to start for anticoagulation, Plavix has been discontinued 6/15, and patient started on Eliquis 2.5 mg oral twice daily (given age above 36, and weight less than 60 kg) . Right hip fracture requiring operative repair.  Management per orthopedic  New-onset atrial fibrillation.  Started on low-dose Toprol.  Currently on full dose Eliquis for anticoagulation .  Echocardiogram shows good ejection fraction. Type 2 diabetes mellitus with hyperlipidemia.  Holding metformin.  Continue simvastatin.  LDL 34. Acute blood loss anemia, drop in hemoglobin.  Hemoglobin drifted down to 7.4.  Usual unit PRBC with good response, hemoglobin is 11.1 today.   Acute kidney injury.  This has improved with IV fluid.  Creatinine went as high as 1.01 and down to 0 0.48. Lung nodule seen on CT scan of the head and neck.  Will end up needing a CT scan of the chest in 3 to 59-month. Hypothyroidism unspecified on levothyroxine Depression on Celexa Patient is a DNR        Code Status:     Code Status Orders  (From admission, onward)           Start     Ordered   11/01/20 1529  Do not attempt resuscitation (DNR)  Continuous       Question Answer Comment  In the event of cardiac or respiratory ARREST Do not call a "code blue"   In the event of cardiac or  respiratory ARREST Do not perform Intubation, CPR, defibrillation or ACLS   In the event of cardiac or respiratory ARREST Use medication by any route, position, wound care, and other measures to relive pain and suffering. May use oxygen, suction and manual treatment of airway obstruction as needed for comfort.   Comments CODE STATUS was discussed with patient's niece at the bedside and she is a DNR.      11/01/20 1528           Code Status History     Date Active Date Inactive Code Status Order ID Comments User Context   11/01/2020 1412 11/01/2020 1528 Full Code [id number]  Collier Bullock, MD ED   08/15/2016 1503 08/18/2016 1855 DNR [id number]  Maren Reamer, MD ED   02/28/2016 1319 03/02/2016 2143 Full Code [id number]  Rondel Jumbo, PA-C ED   01/11/2013 1709 01/12/2013 2016 Full Code [id number]  Thurnell Lose, MD Inpatient   10/27/2012 1833 10/28/2012 2111 DNR [id number]  Melton Alar, PA-C Inpatient      Family Communication: None at bedside Disposition Plan: Status is: Inpatient  Dispo: The patient is from: Home              Anticipated d/c is to: Rehab likely in a couple days              Patient currently status post hip repair and yesterday had multiple embolic strokes.   Difficult to place patient.  No.  Consultants: Neurology Orthopedic surgery  Time spent: 27 minutes  [facility]  [facility]

## 2020-11-06 NOTE — Progress Notes (Signed)
Neurology Progress Note  Patient ID: Lauren [name] is a 87 y.o. with PMHx of  has a past medical history of Breast cancer ([place]), Chronic constipation, Chronic headaches, Diabetes mellitus, Fibrocystic breast, Gastritis, Gout, Hyperlipidemia, Melanoma in situ of left upper extremity ([place]), Pelvic fracture ([place]), TIA (transient ischemic attack) (08/2008), and Venous insufficiency.  Initially consulted for: AMS  Major interval events:  - Afib confirmed on telemetry, remains in Afib with an episode of RVR overnight - Received 1 uPRBCs  Subjective: -Delirious this morning, calling her niece at bedside [facility]) by her sister's name [facility] mother-in-law) -No acute complaints  Exam: Current vital signs: BP (!) 176/94 (BP Location: Left Arm)   Pulse 73   Temp 98 F (36.7 C) (Oral)   Resp 15   SpO2 98%  Vital signs in last 24 hours: Temp:  [97.7 F (36.5 C)-98.9 F (37.2 C)] 98 F (36.7 C) (06/15 0744) Pulse Rate:  [73-146] 73 (06/15 0744) Resp:  [15-20] 15 (06/15 0744) BP: (104-176)/(43-94) 176/94 (06/15 0744) SpO2:  [89 %-99 %] 98 % (06/15 0744)   Gen: In bed, comfortable, Resp: non-labored breathing, no grossly audible wheezing Cardiac: Perfusing extremities well, HR remains irregularly irregular Abd: soft, nt Psych: Calm this morning but distractible Extremities: She does have some bruising and swelling of the right lower extremity  Neuro: MS: Awake, alert, oriented to person, but not to situation or time. Speech is slow but fluent.  Poor attention, duration CN: EMOI, PERRL, orients to stimuli in all visual fields, face symmetric, tongue midlien Motor: No pronator drift.  Pain limited movement of the right lower extremity.  Left lower extremities briskly antigravity and maintains position without drift for 5 seconds Sensory:Intact to light touch throughout Coordination: intact finger to nose bilaterally   Pertinent Labs: Lab Results  Component Value Date   CHOL 86  11/05/2020   HDL 30 (L) 11/05/2020   LDLCALC 34 11/05/2020   LDLDIRECT 49.0 02/23/2017   TRIG 108 11/05/2020   CHOLHDL 2.9 11/05/2020   Lab Results  Component Value Date   HGBA1C 6.7 (H) 11/05/2020   Personally reviewed Head CT without acute intracranial process though there is some chronic microvascular disease as well as evidence of prior strokes as detailed in the radiology report  Personally reviewed CTA without large vessel occlusion and perfusion without tissue at risk infarction though there is multifocal intracranial atherosclerosis progressive from prior scans.  Please see radiology report for full details as well as details on incidental aneurysm versus infundibulum and pulmonary nodules  Personally reviewed MRI brain, punctate infarcts in multiple vascular distributions c/w cardioembolic strokes   ECHO:   1. Left ventricular ejection fraction, by estimation, is 65 to 70%. The left ventricle has normal function. The left ventricle has no regional wall motion abnormalities. There is moderate left ventricular hypertrophy. Left ventricular diastolic parameters are consistent with Grade I diastolic dysfunction (impaired relaxation).   2. Right ventricular systolic function is normal. The right ventricular size is mildly enlarged.   3. Left atrial size was mildly dilated.   4. Right atrial size was mildly dilated.   5. The mitral valve is grossly normal. Trivial mitral valve regurgitation.   6. Tricuspid valve regurgitation is mild to moderate.   7. The aortic valve is tricuspid. Aortic valve regurgitation is mild.   Impression: Cardioemoblic punctate strokes, mental status change on 6/13 likely secondary to a combination of Afib, TIA and hypoactive delirium.  Exam today overall stable though consistent with ongoing delirium  Recommendations: # Punctate multifocal strokes secondary to atrial fibrillation > Stroke workup detailed above, most notable for Afib captured on telemetry.   - LDL meeting goal < 70, continue home simvastatin 20 mg QHS - Eliquis 2.5 mg BID given older than 80 and weight < 60 kg, pharmacy consult placed to confirm no interactions etc. have been overlooked  # Delirium - Multifactorial in the setting of pain, recent operation, hospitalization and intermittent atrial fibrillation with RVR  - environmental support for delirium reviewed with family: Lights on during the day, patient up and out of bed as much as is feasible, OT/PT, quiet dimly lit room at night, reorient patient often, provide hearing aides and glasses if patient uses them routinely, minimize sleep disruptions as much as possible overnight.  As much as possible, reorient patient, and have them engage patient in activities, e.g. playing cards. TV should be off or on neutral background music unless patient engaged and watching. Try to keep interactions with the patient calm and quiet.     # Incidental aneursym vs. Infundibulum - Repeat vessel imaging in 3-6 months to assess for stabiltiy   # Pulmonary nodules, incidental findings - Follow-up CT chest 3 months  Neurology will be available on an as-needed basis going forward, please reconsult if new questions arise  Lesleigh Noe MD-PhD [facility] [phone]  [facility] coverage for [facility] is from 8 AM to 4 AM in-house and 4 PM to 8 PM by telephone/video. 8 PM to 8 AM emergent questions or overnight urgent questions should be addressed to Teleneurology On-call or [facility] neurohospitalist; contact information can be found on AMION

## 2020-11-06 NOTE — Progress Notes (Addendum)
Physical Therapy Treatment Patient Details Name: Lauren [name] MRN: [id number] DOB: November 11, 1927 Today's Date: 11/06/2020    History of Present Illness Pt admitted to [facility] on 11/01/20 for mechanical fall that resulted in R hip fx. Pt s/p R ORIF with IM nailing on 11/02/20 by Dr. Roland Rack. Significant PMH includes: depression, HTN, hypothyroidism, gout, gastritis, hx breast CA, hx of CVA with residual weakness, HLD, GERD, and DM.    PT Comments    Pt received in Semi-Fowler's position with niece, Stanton Kidney, accompanying pt in her room.  Stanton Kidney stated that the pt was not doing well this morning and had bouts of deliriousness.  Pt is able to speak and have a conversation, however is unable to follow any commands, specifically with exercises.  Pt received PROM of the LE's as noted below.  The R LE is swollen, however demonstrates a negative Homan's sign.  Pt will be assessed again during PM session.  Current discharge plans to SNF remain appropriate at this time.  Pt will continue to benefit from skilled therapy in order to address deficits listed below.  Due to patient tolerance, pt, has been downgraded to QD and will continue to be monitored.    Follow Up Recommendations  SNF     Equipment Recommendations  Other (comment) (defer to post acute)    Recommendations for Other Services       Precautions / Restrictions Precautions Precautions: Fall Restrictions Weight Bearing Restrictions: Yes RLE Weight Bearing: Weight bearing as tolerated    Mobility  Bed Mobility               General bed mobility comments: Pt unable to perform any bed mobility due to being delirious.    Transfers                    Ambulation/Gait                 Stairs             Wheelchair Mobility    Modified Rankin (Stroke Patients Only)       Balance                                            Cognition Arousal/Alertness: Lethargic Behavior During Therapy:  Flat affect Overall Cognitive Status: History of cognitive impairments - at baseline                                 General Comments: Pt very lethargic and was able to respond to questions, but unable to follow commands and perform exercises.      Exercises Total Joint Exercises Ankle Circles/Pumps: PROM;Both;10 reps;Supine Heel Slides: PROM;Both;10 reps;Supine Hip ABduction/ADduction: PROM;Both;10 reps;Supine Straight Leg Raises: PROM;Both;10 reps;Supine    General Comments        Pertinent Vitals/Pain Pain Assessment: Faces Faces Pain Scale: Hurts a little bit Pain Intervention(s): Limited activity within patient's tolerance;Monitored during session;Repositioned    Home Living Family/patient expects to be discharged to:: Assisted living               Additional Comments: per facility    Prior Function Level of Independence: Needs assistance  Gait / Transfers Assistance Needed: Mod I with Rollator ADL's / Homemaking Assistance Needed: Assist for dressing and bathing; unknown if assist needed  for toileting Comments: Pt questionable historian of health due to cognitive deficits from previous CVA. Casandra Doffing, present during subjective questioning but unable to confirm PLOF.   PT Goals (current goals can now be found in the care plan section) Acute Rehab PT Goals Patient Stated Goal: "to go home" PT Goal Formulation: With patient Time For Goal Achievement: 11/17/20 Potential to Achieve Goals: Poor Progress towards PT goals: Not progressing toward goals - comment (Pt is very lethargic today and is unable to respond to commands.)    Frequency    BID      PT Plan      Co-evaluation              AM-PAC PT "6 Clicks" Mobility   Outcome Measure  Help needed turning from your back to your side while in a flat bed without using bedrails?: A Lot Help needed moving from lying on your back to sitting on the side of a flat bed without using  bedrails?: Total Help needed moving to and from a bed to a chair (including a wheelchair)?: A Lot Help needed standing up from a chair using your arms (e.g., wheelchair or bedside chair)?: A Lot Help needed to walk in hospital room?: A Lot Help needed climbing 3-5 steps with a railing? : Total 6 Click Score: 10    End of Session Equipment Utilized During Treatment: Oxygen (1.5L O2 via [place]) Activity Tolerance: Patient tolerated treatment well;Patient limited by fatigue;Patient limited by lethargy Patient left: in bed;with call bell/phone within reach;with bed alarm set;with SCD's reapplied;with family/visitor present (bed in chair position)   PT Visit Diagnosis: Unsteadiness on feet (R26.81);Muscle weakness (generalized) (M62.81);Difficulty in walking, not elsewhere classified (R26.2);Pain Pain - Right/Left: Right Pain - part of body: Hip     Time: 9798-9211 PT Time Calculation (min) (ACUTE ONLY): 25 min  Charges:  $Therapeutic Exercise: 23-37 mins                     Gwenlyn Saran, PT, DPT 11/06/20, 12:28 PM    Christie Nottingham 11/06/2020, 12:25 PM

## 2020-11-06 NOTE — Progress Notes (Signed)
   11/06/20 0552  Assess: MEWS Score  Pulse Rate (!) 146  Assess: MEWS Score  MEWS Temp 0  MEWS Systolic 0  MEWS Pulse 3  MEWS RR 0  MEWS LOC 0  MEWS Score 3  MEWS Score Color Yellow  Assess: if the MEWS score is Yellow or Red  Were vital signs taken at a resting state? No  Focused Assessment No change from prior assessment  Early Detection of Sepsis Score *See Row Information* Low  MEWS guidelines implemented *See Row Information* Yes  Treat  MEWS Interventions Administered prn meds/treatments  Pain Scale Faces  Pain Score 0  Take Vital Signs  Increase Vital Sign Frequency  Yellow: Q 2hr X 2 then Q 4hr X 2, if remains yellow, continue Q 4hrs  Notify: Charge Nurse/RN  Name of Charge Nurse/RN Notified Kasey RN  Date Charge Nurse/RN Notified 11/06/20  Time Charge Nurse/RN Notified 0600  Notify: Provider  Provider Name/Title Judd Gaudier  Date Provider Notified 11/06/20  Time Provider Notified [phone]  Notification Type Page  Notification Reason Other (Comment) (Yellow mews, HR in 140's)  Provider response Other (Comment) (Waiting for response)   Pt's HR in 140's. Prn metoprolol administered. MD/ Judd Gaudier made aware. Will cont to monitor the pt.

## 2020-11-06 NOTE — Progress Notes (Signed)
   Subjective: 4 Days Post-Op Procedure(s) (LRB): INTRAMEDULLARY (IM) NAIL INTERTROCHANTRIC (Right) Patient reports only mild pain this AM. Is alert and does answer simple questions but is lacking. Neurology consulted. Patient in A fib, placed on Eliquis today.  Objective: Vital signs in last 24 hours: Temp:  [97.7 F (36.5 C)-98.9 F (37.2 C)] 97.9 F (36.6 C) (06/15 1437) Pulse Rate:  [73-146] 87 (06/15 1437) Resp:  [15-20] 18 (06/15 1437) BP: (128-176)/(51-94) 130/59 (06/15 1437) SpO2:  [89 %-99 %] 99 % (06/15 1437)  Intake/Output from previous day: 06/14 0701 - 06/15 0700 In: 335 [Blood:335] Out: 1700 [Urine:1700] Intake/Output this shift: No intake/output data recorded.  Recent Labs    11/04/20 0456 11/05/20 0340 11/06/20 0804  HGB 8.2* 7.4* 11.1*   Recent Labs    11/05/20 0340 11/06/20 0804  WBC 13.1* 16.1*  RBC 2.48* 3.74*  HCT 22.3* 33.2*  PLT 174 247   Recent Labs    11/05/20 0340 11/06/20 0804  NA 133* 135  K 4.0 3.4*  CL 97* 92*  CO2 30 30  BUN 12 13  CREATININE 0.52 0.48  GLUCOSE 106* 184*  CALCIUM 8.5* 9.1   Recent Labs    11/04/20 0837  INR 1.0    EXAM General - Patient is Alert and Lacking Extremity - Intact pulses distally Incision: moderate drainage No cellulitis present Compartment soft Dressing - Moderate drainage noted to the proximal skin incision. Motor Function - Is able use and move her bilateral lower extremities. Abdomen is soft with normal bowel sounds.  Past Medical History:  Diagnosis Date   Breast cancer ([place])    left breast   Chronic constipation    Chronic headaches    Diabetes mellitus    Fibrocystic breast    Gastritis    Gout    injection in the past   Hyperlipidemia    Melanoma in situ of left upper extremity (HCC)    Pelvic fracture (HCC)    TIA (transient ischemic attack) 08/2008   neg MRI/MRA and CT and carotid dopplers   Venous insufficiency    Assessment/Plan:   4 Days Post-Op  Procedure(s) (LRB): INTRAMEDULLARY (IM) NAIL INTERTROCHANTRIC (Right) Principal Problem:   Closed intertrochanteric fracture of right hip (HCC) Active Problems:   Type 2 diabetes mellitus with hyperlipidemia (HCC)   GERD (gastroesophageal reflux disease)   Hx of completed stroke   HTN (hypertension)   Hypothyroid   Right sided weakness   Lethargy   Closed hip fracture requiring operative repair, right, sequela   Wheeze   Depression   AKI (acute kidney injury) ([place])   Drop in hemoglobin   Dysarthria   Cerebrovascular accident (CVA) due to bilateral embolism of vertebral arteries (HCC)   Paroxysmal atrial fibrillation (HCC)   Acute blood loss anemia   Lung nodules  Estimated body mass index is 21.61 kg/m as calculated from the following:   Height as of 10/01/20: 5\' 3"  (1.6 m).   Weight as of 10/01/20: 55.3 kg.  Patient started on Eliquis today. Tylenol for pain Up with therapy is able today. Continue to work on BM.  DVT Prophylaxis - Lovenox, Foot Pumps, TED hose, and Plavix Weight-Bearing as tolerated to right leg  J. Cameron Proud, PA-C [place] 11/06/2020, 2:48 PM

## 2020-11-07 ENCOUNTER — Inpatient Hospital Stay: Payer: Medicare Other

## 2020-11-07 DIAGNOSIS — Z515 Encounter for palliative care: Secondary | ICD-10-CM

## 2020-11-07 DIAGNOSIS — Z7189 Other specified counseling: Secondary | ICD-10-CM

## 2020-11-07 DIAGNOSIS — E43 Unspecified severe protein-calorie malnutrition: Secondary | ICD-10-CM | POA: Insufficient documentation

## 2020-11-07 DIAGNOSIS — D62 Acute posthemorrhagic anemia: Secondary | ICD-10-CM

## 2020-11-07 LAB — BASIC METABOLIC PANEL
Anion gap: 8 (ref 5–15)
BUN: 17 mg/dL (ref 8–23)
CO2: 32 mmol/L (ref 22–32)
Calcium: 9 mg/dL (ref 8.9–10.3)
Chloride: 94 mmol/L — ABNORMAL LOW (ref 98–111)
Creatinine, Ser: 0.62 mg/dL (ref 0.44–1.00)
GFR, Estimated: >60 mL/min (ref >60)
Glucose, Bld: 147 mg/dL — ABNORMAL HIGH (ref 70–99)
Potassium: 3.3 mmol/L — ABNORMAL LOW (ref 3.5–5.1)
Sodium: 134 mmol/L — ABNORMAL LOW (ref 135–145)

## 2020-11-07 LAB — URINALYSIS, COMPLETE (UACMP) WITH MICROSCOPIC
Bacteria, UA: NONE SEEN
Bilirubin Urine: NEGATIVE
Glucose, UA: 50 mg/dL — AB
Hgb urine dipstick: NEGATIVE
Ketones, ur: NEGATIVE mg/dL
Leukocytes,Ua: NEGATIVE
Nitrite: NEGATIVE
Protein, ur: NEGATIVE mg/dL
Specific Gravity, Urine: 1.009 (ref 1.005–1.030)
Squamous Epithelial / HPF: NONE SEEN (ref 0–5)
pH: 7 (ref 5.0–8.0)

## 2020-11-07 LAB — GLUCOSE, CAPILLARY
Glucose-Capillary: 166 mg/dL — ABNORMAL HIGH (ref 70–99)
Glucose-Capillary: 283 mg/dL — ABNORMAL HIGH (ref 70–99)
Glucose-Capillary: 219 mg/dL — ABNORMAL HIGH (ref 70–99)
Glucose-Capillary: 260 mg/dL — ABNORMAL HIGH (ref 70–99)

## 2020-11-07 LAB — CBC
HCT: 32.2 % — ABNORMAL LOW (ref 36.0–46.0)
Hemoglobin: 10.9 g/dL — ABNORMAL LOW (ref 12.0–15.0)
MCH: 30.1 pg (ref 26.0–34.0)
MCHC: 33.9 g/dL (ref 30.0–36.0)
MCV: 89 fL (ref 80.0–100.0)
Platelets: 266 10*3/uL (ref 150–400)
RBC: 3.62 MIL/uL — ABNORMAL LOW (ref 3.87–5.11)
RDW: 14.3 % (ref 11.5–15.5)
WBC: 18 10*3/uL — ABNORMAL HIGH (ref 4.0–10.5)
nRBC: 0 % (ref 0.0–0.2)

## 2020-11-07 LAB — PROCALCITONIN: Procalcitonin: <0.1 ng/mL

## 2020-11-07 MED ORDER — POTASSIUM CHLORIDE CRYS ER 20 MEQ PO TBCR
40.0000 meq | EXTENDED_RELEASE_TABLET | Freq: Once | ORAL | Status: AC
  Administered 2020-11-07: 40 meq via ORAL
  Filled 2020-11-07: qty 2

## 2020-11-07 MED ORDER — FOOD THICKENER (SIMPLYTHICK HONEY)
1.0000 | ORAL | Status: DC | PRN
  Filled 2020-11-07: qty 1

## 2020-11-07 MED ORDER — NEPRO/CARBSTEADY PO LIQD
237.0000 mL | Freq: Two times a day (BID) | ORAL | Status: DC
  Administered 2020-11-07 – 2020-11-08 (×3): 237 mL via ORAL

## 2020-11-07 MED ORDER — ENSURE ENLIVE PO LIQD: 237.0000 mL | Freq: Three times a day (TID) | ORAL | Status: DC

## 2020-11-07 NOTE — TOC Progression Note (Signed)
Transition of Care [facility]) - Progression Note    Patient Details  Name: Lauren [name] MRN: [id number] Date of Birth: 27-Jun-1927  Transition of Care [facility]) CM/SW [place], RN Phone Number: 11/07/2020, 1:08 PM  Clinical Narrative:    Damaris Schooner with Danielle at [facility] and let her know the patient may DC tomorrow, the patient will go to room 103 B, report to be called to [phone] and ask for 100 hall nurse   Expected Discharge Plan: [place] Barriers to Discharge: Continued Medical Work up  Expected Discharge Plan and Services Expected Discharge Plan: [place] arrangements for the past 2 months: [place]                                       Social Determinants of Health (SDOH) Interventions    Readmission Risk Interventions No flowsheet data found.

## 2020-11-07 NOTE — Progress Notes (Signed)
Physical Therapy Treatment Patient Details Name: Lauren [name] MRN: [id number] DOB: 08/14/1927 Today's Date: 11/07/2020    History of Present Illness Pt admitted to [facility] on 11/01/20 for mechanical fall that resulted in R hip fx. Pt s/p R ORIF with IM nailing on 11/02/20 by Dr. Joice Lofts. Significant PMH includes: depression, HTN, hypothyroidism, gout, gastritis, hx breast CA, hx of CVA with residual weakness, HLD, GERD, and DM.    PT Comments    Participated in exercises as described below.  To EOB with mod a x 1 and good effort with cues.  She is generally steady in sitting with min guard/assist.  She stands to RW with constant mod a x 1 to remain standing.  Encouraged stepping in place but she is unable.  Does take one small sliding step to right but initiates sitting.  Stand pivot to recliner at bedside with heavy mod/max a x 1.  Remains up after session in recliner with needs met.   Follow Up Recommendations  SNF     Equipment Recommendations       Recommendations for Other Services       Precautions / Restrictions Precautions Precautions: Fall Restrictions Weight Bearing Restrictions: Yes RLE Weight Bearing: Weight bearing as tolerated    Mobility  Bed Mobility Overal bed mobility: Needs Assistance Bed Mobility: Rolling;Supine to Sit Rolling: Mod assist   Supine to sit: Mod assist          Transfers Overall transfer level: Needs assistance Equipment used: Rolling walker (2 wheeled) Transfers: Sit to/from Stand Sit to Stand: Mod assist;From elevated surface    Stand pivot with heavy mod/max assist to recliner.        Ambulation/Gait         Gait velocity: decreased   General Gait Details: unable   Stairs             Wheelchair Mobility    Modified Rankin (Stroke Patients Only)       Balance Overall balance assessment: Needs assistance Sitting-balance support: Bilateral upper extremity supported;Feet supported Sitting balance-Leahy  Scale: Fair     Standing balance support: Bilateral upper extremity supported;During functional activity Standing balance-Leahy Scale: Poor Standing balance comment: Required min-mod assist standing in RW                            Cognition Arousal/Alertness: Awake/alert Behavior During Therapy: WFL for tasks assessed/performed Overall Cognitive Status: History of cognitive impairments - at baseline                                        Exercises Other Exercises Other Exercises: BLE AAROM in supine x 10    General Comments        Pertinent Vitals/Pain Pain Assessment: Faces Faces Pain Scale: Hurts a little bit Pain Location: no pain at rest, minor discomfort with mobility but tolerates well. Pain Descriptors / Indicators: Sore;Grimacing Pain Intervention(s): Limited activity within patient's tolerance;Monitored during session;Repositioned    Home Living     Available Help at Discharge: Available 24 hours/day Type of Home:  (ALF)              Prior Function            PT Goals (current goals can now be found in the care plan section) Progress towards PT goals: Progressing toward goals  Frequency    7X/week      PT Plan Current plan remains appropriate    Co-evaluation              AM-PAC PT "6 Clicks" Mobility   Outcome Measure  Help needed turning from your back to your side while in a flat bed without using bedrails?: A Lot Help needed moving from lying on your back to sitting on the side of a flat bed without using bedrails?: A Lot Help needed moving to and from a bed to a chair (including a wheelchair)?: A Lot Help needed standing up from a chair using your arms (e.g., wheelchair or bedside chair)?: A Lot Help needed to walk in hospital room?: Total Help needed climbing 3-5 steps with a railing? : Total 6 Click Score: 10    End of Session Equipment Utilized During Treatment: Gait belt;Oxygen Activity  Tolerance: Patient tolerated treatment well Patient left: in chair;with call bell/phone within reach;with chair alarm set Nurse Communication: Mobility status PT Visit Diagnosis: Unsteadiness on feet (R26.81);Muscle weakness (generalized) (M62.81);Difficulty in walking, not elsewhere classified (R26.2);Pain Pain - Right/Left: Right Pain - part of body: Hip     Time: 5248-1859 PT Time Calculation (min) (ACUTE ONLY): 24 min  Charges:  $Therapeutic Exercise: 8-22 mins $Therapeutic Activity: 8-22 mins                    Chesley Noon, PTA 11/07/20, 11:13 AM , 11:10 AM

## 2020-11-07 NOTE — Progress Notes (Signed)
Speech Language Pathology Evaluation Patient Details Name: BASILIA STUCKERT MRN: [id number] DOB: Dec 28, 1927 Today's Date: 11/07/2020 Time: 0915-1000 SLP Time Calculation (min) (ACUTE ONLY): 45 min  Problem List:  Patient Active Problem List   Diagnosis Date Noted   Cerebrovascular accident (CVA) due to bilateral embolism of vertebral arteries (HCC)    Paroxysmal atrial fibrillation ([place])    Acute blood loss anemia    Lung nodules    Dysarthria    AKI (acute kidney injury) ([place])    Drop in hemoglobin    Lethargy    Closed hip fracture requiring operative repair, right, sequela    Wheeze    Depression    Closed intertrochanteric fracture of right hip ([place]) 11/01/2020   Exposure to COVID-19 virus 12/26/2018   Fall at home, initial encounter 12/16/2018   Exposure to communicable disease 12/16/2018   Diabetic neuropathy ([place]) 11/22/2018   Weight loss 08/01/2018   Bloating 11/30/2017   Right sided weakness 09/07/2017   Hypothyroid 95/18/8416   Diastolic dysfunction 60/63/0160   Urinary frequency 09/17/2016   Hx of completed stroke 08/15/2016   HTN (hypertension) 08/15/2016   GERD (gastroesophageal reflux disease) 08/06/2015   Hearing loss 08/06/2015   Estrogen deficiency 05/01/2014   Left knee pain 09/22/2013   Encounter for Medicare annual wellness exam 04/04/2013   Encounter for examination for admission to assisted living facility 04/04/2013   History of melanoma excision 09/27/2012   History of breast cancer, left 01/08/2011   Chronic abdominal pain 09/17/2010   History of pelvic fracture 09/17/2010   DEGENERATIVE JOINT DISEASE 02/13/2010   Migraine variant 11/06/2009   Osteopenia 10/18/2008   Transient ischemic attack 09/10/2008   Type 2 diabetes mellitus with hyperlipidemia ([place]) 10/06/2007   VENOUS INSUFFICIENCY 10/06/2007   Diverticulosis of colon 10/06/2007   FIBROCYSTIC BREAST DISEASE 10/06/2007   Hyperlipidemia associated with type 2 diabetes mellitus ([place])  11/29/2006   Past Medical History:  Past Medical History:  Diagnosis Date   Breast cancer ([place])    left breast   Chronic constipation    Chronic headaches    Diabetes mellitus    Fibrocystic breast    Gastritis    Gout    injection in the past   Hyperlipidemia    Melanoma in situ of left upper extremity ([place])    Pelvic fracture ([place])    TIA (transient ischemic attack) 08/2008   neg MRI/MRA and CT and carotid dopplers   Venous insufficiency    Past Surgical History:  Past Surgical History:  Procedure Laterality Date   APPENDECTOMY     ARM SKIN LESION BIOPSY / EXCISION Right 11/02/2016   BREAST BIOPSY Left 10/18/2007   BREAST CYST ASPIRATION     BREAST EXCISIONAL BIOPSY Right    x2   BREAST LUMPECTOMY  09/2007   with sentinel LN biopsy   BREAST LUMPECTOMY     left   EYE SURGERY     retinal, then cataract   INTRAMEDULLARY (IM) NAIL INTERTROCHANTERIC Right 11/02/2020   Procedure: INTRAMEDULLARY (IM) NAIL INTERTROCHANTRIC;  Surgeon: Corky Mull, MD;  Location: [facility] ORS;  Service: Orthopedics;  Laterality: Right;   HPI:  Per admitting H&P "JARA FEIDER is a 87 y.o. female with medical history significant for dyslipidemia, TIA, diabetes mellitus and left breast cancer who was brought into the emergency room by EMS after she sustained a mechanical fall landing on her right hip.  Patient ambulates with a rolling walker and had gone outside.  She states that  her legs gave way and she fell landing on her right hip.  She denied feeling dizzy or lightheaded and did not lose consciousness.  She did not hit her head when she fell.  She denies having any chest pain, no shortness of breath, no nausea, no vomiting, no palpitations, no diaphoresis, no lower extremity swelling, no changes in her bowel habits, no fever, no chills, no urinary symptoms" Code stroke was called on 11/04/20 and MRI showed multiple embolic infarcts.   Assessment / Plan / Recommendation Clinical Impression   Patient presents with significant cognitive-communication impairments, with impaired sustained attention during functional tasks (eating breakfast meal) and conversation, slow processing, and memory impairments. Baseline deficits are noted, however acute exacerbation is impacting pt functionally; she requires cues and assistance to remain attentive to POs at mealtime and to initiate a swallow, and requires extended time to follow commands or answer questions. Left facial droop is noted; no appreciable dysarthria. Portions of the MMSE administered; unable to administer entire examination due to severity of deficits and time pt required for processing/ completing tasks. During functional assessment, noted consistent coughing with thin liquids today, even when assisting pt with smaller sips, due to delayed swallow initiation (loss of attention appears to contribute). No overt signs of aspiration with nectar, and pt appeared to have better control/attention to bolus, so downgraded to Dysphagia 1, nectar liquids. Discussed with RN and pt's niece/POA who arrived at end of session. SLP will continue to follow for dysphagia and for cognitive communication to improve ability to attend to POs and to communicate wants/needs.    SLP Assessment  SLP Recommendation/Assessment: Patient needs continued Speech Lanaguage Pathology Services SLP Visit Diagnosis: Dysphagia, oropharyngeal phase (R13.12)    Follow Up Recommendations  Skilled Nursing facility    Frequency and Duration min 2x/week  2 weeks      SLP Evaluation Cognition  Overall Cognitive Status: History of cognitive impairments - at baseline Orientation Level: Oriented to person;Oriented to place Attention: Sustained Sustained Attention: Impaired Sustained Attention Impairment: Verbal basic;Functional basic (easily distracted) Memory: Impaired Memory Impairment: Decreased short term memory;Decreased recall of new information Immediate Memory Recall:  Sock;Blue;Bed Memory Recall Sock: Without Cue Memory Recall Blue: Without Cue Memory Recall Bed: Not able to recall Awareness: Impaired Problem Solving: Impaired Problem Solving Impairment: Verbal basic;Functional basic (requires cues to orient spoon, retrieve items from tray. slow processing) Executive Function:  (impaired due to lower level deficits (attention, memory) Safety/Judgment: Impaired       Comprehension  Auditory Comprehension Overall Auditory Comprehension: Appears within functional limits for tasks assessed (with slow rate) Yes/No Questions: Impaired (needs extra time for processing) Commands: Impaired (comprehension intact, but very slow processing. needs repetition and chunking) Conversation: Simple Interfering Components: Attention;Working memory;Processing speed EffectiveTechniques: Extra processing time;Repetition;Visual/Gestural cues;Increased volume Visual Recognition/Discrimination Discrimination: Within Function Limits Reading Comprehension Reading Status: Not tested    Expression Expression Primary Mode of Expression: Verbal Verbal Expression Overall Verbal Expression: Impaired Automatic Speech: Name;Social Response Level of Generative/Spontaneous Verbalization: Sentence Repetition: Impaired Level of Impairment: Phrase level Naming: Impairment Divergent: 0-24% accurate (3 animals in 60 seconds) Other Naming Comments: hesitation, some wordfinding difficulty in conversation Pragmatics: No impairment Interfering Components: Attention Effective Techniques: Open ended questions;Semantic cues Non-Verbal Means of Communication: Not applicable Written Expression Dominant Hand: Right Written Expression: Not tested   Oral / Motor  Oral Motor/Sensory Function Overall Oral Motor/Sensory Function: Mild impairment Facial ROM: Reduced left;Suspected CN VII (facial) dysfunction Facial Symmetry: Abnormal symmetry left;Suspected CN VII (facial)  dysfunction  Lingual ROM: Within Functional Limits Lingual Symmetry: Within Functional Limits Velum: Within Functional Limits Mandible: Within Functional Limits Motor Speech Overall Motor Speech: Appears within functional limits for tasks assessed   [place], [place], [place] 11/07/2020, 10:31 AM

## 2020-11-07 NOTE — Progress Notes (Addendum)
Nutrition Follow-up  DOCUMENTATION CODES:  Severe malnutrition in context of chronic illness  INTERVENTION: Continue current diet as ordered, per SLP. Encourage PO intake MVI po daily  Change Ensure Enlive to Nepro Shake po BID due to downgraded fluid consistency. Each supplement provides 425 kcal and 19 grams protein Magic cup TID with meals, each supplement provides 290 kcal and 9 grams of protein  NUTRITION DIAGNOSIS:  Severe Malnutrition related to chronic illness as evidenced by severe muscle depletion, severe fat depletion.  GOAL:  Patient will meet greater than or equal to 90% of their needs  MONITOR:  PO intake, Supplement acceptance, Labs, Weight trends, Skin, I & O's  REASON FOR ASSESSMENT:  Consult Hip fracture protocol  ASSESSMENT:  87 y/o female with h/o HLD, DM, TIA, gout and breast cancer who is admitted with hip fracture after fall.  Pt sleeping at the time of visit in bedside chair, did not wake during physical exam. Niece present in room, able to provide some nutrition history. Overall poor intake of meals this admission. Niece reports that SLP worked with pt this AM and changed her to nectar thick liquids after she had been coughing with thin, will adjust nutrition supplements to nectar thick. Niece reports that pt does like ensure and was drinking them when she still lived independently, unsure if she has been consuming them at ALF.   Niece reports a UBW of about 106 lbs. States she believes she has lost weight judging by her appearance. At baseline, ambulated with a walker but has become quite frail. Significant muscle and fat deficits present on exam.  6/10 - admitted with hip fx 6/11 - Op, ORIF of displaced intertrochanteric right hip fracture 6/13 - Change in mentation, MRI showed acute to subacute infarcts involving the parasagittal left frontal lobe, superior lateral left thalamus, bilateral occipital lobes  Nutritionally Relevant Medications: Scheduled  Meds:  dicyclomine  10 mg Oral BID   docusate sodium  100 mg Oral BID   feeding supplement  237 mL Oral TID BM   ferrous [id number] C-folic acid  1 capsule Oral BID   insulin aspart  0-5 Units Subcutaneous QHS   insulin aspart  0-9 Units Subcutaneous TID WC   levothyroxine  50 mcg Oral Q0600   multivitamin with minerals  1 tablet Oral Daily   multivitamin-lutein  1 capsule Oral BID   pantoprazole  40 mg Oral Daily   polyethylene glycol  17 g Oral Daily   potassium chloride  40 mEq Oral Once   simvastatin  20 mg Oral QHS   vitamin B-12  500 mcg Oral Daily   Vitamin D (Ergocalciferol)  50,000 Units Oral Q30 days   PRN Meds: bisacodyl, magnesium hydroxide, ondansetron, senna-docusate, sodium phosphate  Labs Reviewed: Na 134 / Chloride 94 K 3.3 SBG ranges from 125-185 mg/dL over the last 24 hours HgbA1c 6.7% (6/14)  NUTRITION - FOCUSED PHYSICAL EXAM: Flowsheet Row Most Recent Value  Orbital Region Severe depletion  Upper Arm Region Severe depletion  Thoracic and Lumbar Region Moderate depletion  Buccal Region Severe depletion  Temple Region Severe depletion  Clavicle Bone Region Moderate depletion  Clavicle and Acromion Bone Region Severe depletion  Scapular Bone Region Severe depletion  Dorsal Hand Severe depletion  Patellar Region Severe depletion  Anterior Thigh Region Severe depletion  Posterior Calf Region Severe depletion  Edema (RD Assessment) None  Hair Reviewed  Eyes Unable to assess  Mouth Unable to assess  Skin Reviewed  Nails Reviewed  Diet Order:   Diet Order             DIET - DYS 1 Room service appropriate? Yes with Assist; Fluid consistency: Nectar Thick  Diet effective now                  EDUCATION NEEDS:  Not appropriate for education at this time  Skin:  Skin Assessment: Skin Integrity Issues: Skin Integrity Issues:: Incisions Incisions: right hip  Last BM:  6/16 - type 6  Height:  Ht Readings from Last 1 Encounters:   10/01/20 5\' 3"  (1.6 m)    Weight:  Wt Readings from Last 1 Encounters:  10/01/20 55.3 kg   Ideal Body Weight:  52.3 kg  Estimated Nutritional Needs:  Kcal:  1400-1600kcal/day Protein:  70-80g/day Fluid:  1.4-1.6L/day  Ranell Patrick, RD, LDN Clinical Dietitian Pager on [place]

## 2020-11-07 NOTE — Progress Notes (Signed)
Subjective: 5 Days Post-Op Procedure(s) (LRB): INTRAMEDULLARY (IM) NAIL INTERTROCHANTRIC (Right) Patient is resting comfortably this morning. Able to wake up and does answer some simple questions, denies any pain in the right hip this AM. MRI demonstrated signs of strokes, started on Eliquis yesterday. Patient underwent transfusion, Hg 10.9  Objective: Vital signs in last 24 hours: Temp:  [97.6 F (36.4 C)-98.7 F (37.1 C)] 98.7 F (37.1 C) (06/16 0750) Pulse Rate:  [52-87] 52 (06/16 0750) Resp:  [16-20] 16 (06/16 0750) BP: (125-158)/(59-74) 125/74 (06/16 0750) SpO2:  [94 %-100 %] 100 % (06/16 0750)  Intake/Output from previous day: 06/15 0701 - 06/16 0700 In: -  Out: 500 [Urine:500] Intake/Output this shift: No intake/output data recorded.  Recent Labs    11/05/20 0340 11/06/20 0804 11/07/20 0407  HGB 7.4* 11.1* 10.9*   Recent Labs    11/06/20 0804 11/07/20 0407  WBC 16.1* 18.0*  RBC 3.74* 3.62*  HCT 33.2* 32.2*  PLT 247 266   Recent Labs    11/06/20 0804 11/07/20 0407  NA 135 134*  K 3.4* 3.3*  CL 92* 94*  CO2 30 32  BUN 13 17  CREATININE 0.48 0.62  GLUCOSE 184* 147*  CALCIUM 9.1 9.0   Recent Labs    11/04/20 0837  INR 1.0    EXAM General - Patient is Alert and Lacking Extremity - Intact pulses distally Incision: moderate drainage No cellulitis present Compartment soft Dressing - Moderate drainage noted to the proximal skin incision.  Dried bloody drainage. No erythema. Thigh is soft to palpation. Motor Function - Dorsiflexion and plantarflexion intact to bilateral lower extremities. Abdomen is soft with normal bowel sounds. Negative Homan's to bilateral lower extremities.  Past Medical History:  Diagnosis Date   Breast cancer ([place])    left breast   Chronic constipation    Chronic headaches    Diabetes mellitus    Fibrocystic breast    Gastritis    Gout    injection in the past   Hyperlipidemia    Melanoma in situ of left upper  extremity (HCC)    Pelvic fracture (HCC)    TIA (transient ischemic attack) 08/2008   neg MRI/MRA and CT and carotid dopplers   Venous insufficiency    Assessment/Plan:   5 Days Post-Op Procedure(s) (LRB): INTRAMEDULLARY (IM) NAIL INTERTROCHANTRIC (Right) Principal Problem:   Closed intertrochanteric fracture of right hip (HCC) Active Problems:   Type 2 diabetes mellitus with hyperlipidemia (HCC)   GERD (gastroesophageal reflux disease)   Hx of completed stroke   HTN (hypertension)   Hypothyroid   Right sided weakness   Lethargy   Closed hip fracture requiring operative repair, right, sequela   Wheeze   Depression   AKI (acute kidney injury) ([place])   Drop in hemoglobin   Dysarthria   Cerebrovascular accident (CVA) due to bilateral embolism of vertebral arteries (HCC)   Paroxysmal atrial fibrillation (HCC)   Acute blood loss anemia   Lung nodules  Estimated body mass index is 21.61 kg/m as calculated from the following:   Height as of 10/01/20: 5\' 3"  (1.6 m).   Weight as of 10/01/20: 55.3 kg.  Patient currently on Eliquis. Labs reviewed, Hg 10.9 after transfusion. K+ 3.3 this morning, will supplement. WBC trending up, 18.0.  No signs of infection to the right hip. UA has been ordered for the patient. Tylenol as needed for pain. Up with therapy as tolerated. Continue to work on BM.  Upon discharge, staples can be removed on  11/15/20. Follow-up with [facility] orthopaedics in 6 weeks. Recommend continued pain regimen with tylenol. Discharge on Eliquis due to recent new onset A. Fib.  DVT Prophylaxis - Foot Pumps, TED hose, and Eliquis Weight-Bearing as tolerated to right leg  J. Cameron Proud, PA-C [place] 11/07/2020, 7:55 AM

## 2020-11-07 NOTE — Progress Notes (Signed)
Patient ID: Lauren [name], female   DOB: 1927-10-24, 87 y.o.   MRN: [id number] [facility] PROGRESS NOTE  Lauren [name] HQI:[id number] DOB: 02/09/1928 DOA: 11/01/2020 PCP: Pcp, No  HPI  Brief history.  Patient admitted 11/01/2020 after fall on having right hip pain found to have a right hip fracture.  Dr. Roland Rack did aReduction and internal fixation of intertrochanteric right hip fracture on 11/02/2020.  Early morning on 11/04/2020 had mental status change and inability to talk.  Initial CT scan was negative.  I consulted neurology and a code stroke was called.  CT angio did not show any stroke but later that day MRI did show multiple strokes in the left parasagittal frontal lobe, superior lateral left thalamus and bilateral occipital lobes.  New chronic microhemorrhages and advanced chronic microvascular ischemic changes.  New onset atrial fibrillation seen on telemetry.    Subjective:  Bladder scan this morning showing post residual volume of 900 cc, required in and out, good oral intake this morning 75% of her breakfast, diet has been downgraded to dysphagia 1 with nectar thick.    Objective: Vitals:   11/06/20 2338 11/07/20 0750  BP: (!) 158/70 125/74  Pulse: 70 (!) 52  Resp: 20 16  Temp: 97.8 F (36.6 C)   SpO2: 94% 100%    Intake/Output Summary (Last 24 hours) at 11/07/2020 1056 Last data filed at 11/07/2020 1000 Gross per 24 hour  Intake --  Output 1400 ml  Net -1400 ml     Exam:  Patient is extremely frail, deconditioned, she wakes up, but confused Symmetrical Chest wall movement, Good air movement bilaterally, CTAB Irr irr No Gallops,Rubs or new Murmurs, No Parasternal Heave +ve B.Sounds, he has suprapubic fullness and tenderness, No rebound - guarding or rigidity. No Cyanosis, Clubbing or edema, No new Rash or bruise      Data Reviewed: Basic Metabolic Panel: Recent Labs  Lab 11/03/20 0454 11/04/20 0456 11/05/20 0340 11/06/20 0804 11/07/20 0407  NA 130*  132* 133* 135 134*  K 4.1 4.1 4.0 3.4* 3.3*  CL 95* 97* 97* 92* 94*  CO2 30 30 30 30  32  GLUCOSE 155* 156* 106* 184* 147*  BUN 15 15 12 13 17   CREATININE 1.01* 0.61 0.52 0.48 0.62  CALCIUM 8.5* 8.5* 8.5* 9.1 9.0  MG  --  1.9  --   --   --    Liver Function Tests: Recent Labs  Lab 11/04/20 0456  AST 15  ALT <5  ALKPHOS 66  BILITOT 0.5  PROT 5.4*  ALBUMIN 2.6*    CBC: Recent Labs  Lab 11/03/20 0454 11/04/20 0456 11/05/20 0340 11/06/20 0804 11/07/20 0407  WBC 15.9* 14.2* 13.1* 16.1* 18.0*  HGB 8.3* 8.2* 7.4* 11.1* 10.9*  HCT 24.8* 24.6* 22.3* 33.2* 32.2*  MCV 89.9 90.4 89.9 88.8 89.0  PLT 155 149* 174 247 266     CBG: Recent Labs  Lab 11/06/20 0745 11/06/20 1126 11/06/20 1708 11/06/20 2058 11/07/20 0804  GLUCAP 185* 150* 176* 125* 166*    Recent Results (from the past 240 hour(s))  Resp Panel by RT-PCR (Flu A&B, Covid) Nasopharyngeal Swab     Status: None   Collection Time: 11/01/20 12:48 PM   Specimen: Nasopharyngeal Swab; Nasopharyngeal(NP) swabs in vial transport medium  Result Value Ref Range Status   SARS Coronavirus 2 by RT PCR NEGATIVE NEGATIVE Final    Comment: (NOTE) SARS-CoV-2 target nucleic acids are NOT DETECTED.  The SARS-CoV-2 RNA is generally detectable in upper  respiratory specimens during the acute phase of infection. The lowest concentration of SARS-CoV-2 viral copies this assay can detect is 138 copies/mL. A negative result does not preclude SARS-Cov-2 infection and should not be used as the sole basis for treatment or other patient management decisions. A negative result may occur with  improper specimen collection/handling, submission of specimen other than nasopharyngeal swab, presence of viral mutation(s) within the areas targeted by this assay, and inadequate number of viral copies(<138 copies/mL). A negative result must be combined with clinical observations, patient history, and epidemiological information. The expected  result is Negative.  Fact Sheet for Patients:  EntrepreneurPulse.com.au  Fact Sheet for Healthcare Providers:  IncredibleEmployment.be  This test is no t yet approved or cleared by the [place] FDA and  has been authorized for detection and/or diagnosis of SARS-CoV-2 by FDA under an Emergency Use Authorization (EUA). This EUA will remain  in effect (meaning this test can be used) for the duration of the COVID-19 declaration under Section 564(b)(1) of the Act, 21 U.S.C.section 360bbb-3(b)(1), unless the authorization is terminated  or revoked sooner.       Influenza A by PCR NEGATIVE NEGATIVE Final   Influenza B by PCR NEGATIVE NEGATIVE Final    Comment: (NOTE) The Xpert Xpress SARS-CoV-2/FLU/RSV plus assay is intended as an aid in the diagnosis of influenza from Nasopharyngeal swab specimens and should not be used as a sole basis for treatment. Nasal washings and aspirates are unacceptable for Xpert Xpress SARS-CoV-2/FLU/RSV testing.  Fact Sheet for Patients: EntrepreneurPulse.com.au  Fact Sheet for Healthcare Providers: IncredibleEmployment.be  This test is not yet approved or cleared by the [place] FDA and has been authorized for detection and/or diagnosis of SARS-CoV-2 by FDA under an Emergency Use Authorization (EUA). This EUA will remain in effect (meaning this test can be used) for the duration of the COVID-19 declaration under Section 564(b)(1) of the Act, 21 U.S.C. section 360bbb-3(b)(1), unless the authorization is terminated or revoked.  Performed at [facility], [address], [place], [place] [address]   Surgical PCR screen     Status: Abnormal   Collection Time: 11/02/20  5:53 AM   Specimen: Nasal Mucosa; Nasal Swab  Result Value Ref Range Status   MRSA, PCR POSITIVE (A) NEGATIVE Final    Comment: RESULT CALLED TO, READ BACK BY AND VERIFIED WITH: BREANNA CAMPBELL  ON 11/02/20 AT 0730 QSD    Staphylococcus aureus POSITIVE (A) NEGATIVE Final    Comment: (NOTE) The Xpert SA Assay (FDA approved for NASAL specimens in patients 32 years of age and older), is one component of a comprehensive surveillance program. It is not intended to diagnose infection nor to guide or monitor treatment. Performed at [facility], [address] [place], [place] [address]      Studies: [facility] Chest [place] 1 View  Result Date: 11/07/2020 CLINICAL DATA:  Leukocytosis EXAM: PORTABLE CHEST 1 VIEW COMPARISON:  11/03/2020 FINDINGS: Cardiac shadow is within normal limits. Lungs are well aerated bilaterally. No focal infiltrate or sizable effusion is seen. No bony abnormality is noted. IMPRESSION: No active disease. Electronically Signed   By: Inez Catalina M.D.   On: 11/07/2020 08:12    Scheduled Meds:  apixaban  2.5 mg Oral BID   citalopram  5 mg Oral Daily   dicyclomine  10 mg Oral BID   docusate sodium  100 mg Oral BID   feeding supplement  237 mL Oral TID BM   ferrous [id number] C-folic acid  1 capsule Oral BID   insulin aspart  0-5 Units Subcutaneous QHS   insulin aspart  0-9 Units Subcutaneous TID WC   levothyroxine  50 mcg Oral Q0600   metoprolol succinate  25 mg Oral Daily   multivitamin with minerals  1 tablet Oral Daily   multivitamin-lutein  1 capsule Oral BID   pantoprazole  40 mg Oral Daily   polyethylene glycol  17 g Oral Daily   polyvinyl alcohol  1 drop Both Eyes BID   simvastatin  20 mg Oral QHS   vitamin B-12  500 mcg Oral Daily   Vitamin D (Ergocalciferol)  50,000 Units Oral Q30 days   Continuous Infusions:  sodium chloride 10 mL/hr at 11/03/20 0523   methocarbamol (ROBAXIN) IV       Assessment/Plan:  Multiple embolic strokes seen on MRI.  Juliane but greatly appreciated, in the setting of new onset A. fib, recommendation to start for anticoagulation, Plavix has been discontinued 6/15, and patient started on Eliquis 2.5 mg oral  twice daily (given age above 11, and weight less than 60 kg) .  Her diet was downgraded to dysphagia 1 with nectar thick.  Right hip fracture requiring operative repair.   -Management per orthopedic   New-onset atrial fibrillation.   - Started on low-dose Toprol.  Currently on full dose Eliquis for anticoagulation .  Echocardiogram shows good ejection fraction.  Type 2 diabetes mellitus with hyperlipidemia.   - Holding metformin.  Continue simvastatin.  LDL 34.  Acute blood loss anemia -Expected post operative loss, hemoglobin dropped to 7.4, she received 1 unit PRBC been stable at 10.9 this morning  Leukocytosis -Procalcitonin within normal limit, negative UA and chest x-ray, will continue to monitor, she is nontoxic-appearing  Urinary retention -in and out needed, if no improvement she will in needing reinserted.  Acute kidney injury.  This has improved with IV fluid.  Creatinine went as high as 1.01 and down to 0 0.48.  Lung nodule seen on CT scan of the head and neck.  Will end up needing a CT scan of the chest in 3 to 80-month.  Hypothyroidism unspecified on levothyroxine  Depression on Celexa  Patient is a DNR    Family Communication: D/W niece [name] Disposition Plan: Status is: Inpatient  Dispo: The patient is from: Home              Anticipated d/c is to: Rehab likely in a couple days              Patient currently status post hip repair and yesterday had multiple embolic strokes.   Difficult to place patient.  No.  Consultants: Neurology Orthopedic surgery    Code Status:     Code Status Orders  (From admission, onward)           Start     Ordered   11/01/20 1529  Do not attempt resuscitation (DNR)  Continuous       Question Answer Comment  In the event of cardiac or respiratory ARREST Do not call a "code blue"   In the event of cardiac or respiratory ARREST Do not perform Intubation, CPR, defibrillation or ACLS   In the event of cardiac or  respiratory ARREST Use medication by any route, position, wound care, and other measures to relive pain and suffering. May use oxygen, suction and manual treatment of airway obstruction as needed for comfort.   Comments CODE STATUS was discussed with patient's niece at the bedside and  she is a DNR.      11/01/20 1528           Code Status History     Date Active Date Inactive Code Status Order ID Comments User Context   11/01/2020 1412 11/01/2020 1528 Full Code [id number]  Collier Bullock, MD ED   08/15/2016 1503 08/18/2016 1855 DNR [id number]  Maren Reamer, MD ED   02/28/2016 1319 03/02/2016 2143 Full Code [id number]  Rondel Jumbo, PA-C ED   01/11/2013 1709 01/12/2013 2016 Full Code [id number]  Thurnell Lose, MD Inpatient   10/27/2012 1833 10/28/2012 2111 DNR [id number]  Melton Alar, PA-C Inpatient        [facility]  [facility]

## 2020-11-07 NOTE — Care Management Important Message (Signed)
Important Message  Patient Details  Name: Lauren [name] MRN: [id number] Date of Birth: 10-03-1927   Medicare Important Message Given:  Yes     Juliann Pulse A Eugenio Dollins 11/07/2020, 3:04 PM

## 2020-11-07 NOTE — Consult Note (Addendum)
Consultation Note Date: 11/07/2020   Patient Name: Lauren [name]  DOB: 1927/06/30  MRN: [id number]  Age / Sex: 87 y.o., female  PCP: Pcp, No Referring Physician: Albertine Patricia, MD  Reason for Consultation: Establishing goals of care  HPI/Patient Profile:  Patient admitted 11/01/2020 after fall on having right hip pain found to have a right hip fracture.  Dr. Roland Rack did aReduction and internal fixation of intertrochanteric right hip fracture on 11/02/2020.  Clinical Assessment and Goals of Care: Patient is resting in bed. She opens her eyes and looks at the ceiling, she does not speak. Spoke with her niece who is her HPOA. She states patient lived alone independently until she went to [facility] due to falls. She moved into [place] in March 2022.   We discussed her diagnosis, prognosis, GOC, EOL wishes disposition and options.  Created space and opportunity for patient  to explore thoughts and feelings regarding current medical information.   A detailed discussion was had today regarding advanced directives.  Concepts specific to code status, artifical feeding and hydration, IV antibiotics and rehospitalization were discussed.  The difference between an aggressive medical intervention path and a comfort care path was discussed.  Values and goals of care important to patient and family were attempted to be elicited.  Discussed limitations of medical interventions to prolong quality of life in some situations and discussed the concept of human mortality. She discusses her pleasure with being at [facility], and believes she will enjoy her time at [facility].    She is able to accurately articulate patient's status and poor overall prognosis. She is planning for all scenarios. She confirms DNR/DNI, no dialysis, no feeding tube. She would like to treat the treatable and take one day at a time.       SUMMARY OF RECOMMENDATIONS   Recommend palliative to follow outpatient  Prognosis:  Poor overall       Primary Diagnoses: Present on Admission:  Closed intertrochanteric fracture of right hip (HCC)  GERD (gastroesophageal reflux disease)  HTN (hypertension)  Hypothyroid   I have reviewed the medical record, interviewed the patient and family, and examined the patient. The following aspects are pertinent.  Past Medical History:  Diagnosis Date   Breast cancer ([place])    left breast   Chronic constipation    Chronic headaches    Diabetes mellitus    Fibrocystic breast    Gastritis    Gout    injection in the past   Hyperlipidemia    Melanoma in situ of left upper extremity (HCC)    Pelvic fracture (HCC)    TIA (transient ischemic attack) 08/2008   neg MRI/MRA and CT and carotid dopplers   Venous insufficiency    Social History   Socioeconomic History   Marital status: Single    Spouse name: Not on file   Number of children: Not on file   Years of education: college   Highest education level: Not on file  Occupational History  Not on file  Tobacco Use   Smoking status: Never   Smokeless tobacco: Never  Vaping Use   Vaping Use: Never used  Substance and Sexual Activity   Alcohol use: No    Alcohol/week: 0.0 standard drinks   Drug use: No   Sexual activity: Not Currently  Other Topics Concern   Not on file  Social History Narrative   Lives with and cares for both sisters   Strong faith   Denies caffeine use    Social Determinants of Health   Financial Resource Strain: Not on file  Food Insecurity: Not on file  Transportation Needs: Not on file  Physical Activity: Not on file  Stress: Not on file  Social Connections: Not on file   Family History  Problem Relation Age of Onset   Breast cancer Sister    Stroke Sister    Cancer Sister        breast   Breast cancer Sister    Stroke Sister    Cancer Sister        breast   Stroke Mother     Stroke Father    Stroke Brother    Cancer Brother        prostate   Stroke Brother    Stroke Sister    CAD Sister    Scheduled Meds:  apixaban  2.5 mg Oral BID   citalopram  5 mg Oral Daily   dicyclomine  10 mg Oral BID   docusate sodium  100 mg Oral BID   feeding supplement (NEPRO CARB STEADY)  237 mL Oral BID BM   ferrous [id number] C-folic acid  1 capsule Oral BID   insulin aspart  0-5 Units Subcutaneous QHS   insulin aspart  0-9 Units Subcutaneous TID WC   levothyroxine  50 mcg Oral Q0600   metoprolol succinate  25 mg Oral Daily   multivitamin with minerals  1 tablet Oral Daily   multivitamin-lutein  1 capsule Oral BID   pantoprazole  40 mg Oral Daily   polyethylene glycol  17 g Oral Daily   polyvinyl alcohol  1 drop Both Eyes BID   simvastatin  20 mg Oral QHS   vitamin B-12  500 mcg Oral Daily   Vitamin D (Ergocalciferol)  50,000 Units Oral Q30 days   Continuous Infusions:  sodium chloride 10 mL/hr at 11/03/20 0523   methocarbamol (ROBAXIN) IV     PRN Meds:.sodium chloride, acetaminophen, bisacodyl, food thickener, ipratropium-albuterol, magnesium hydroxide, methocarbamol **OR** methocarbamol (ROBAXIN) IV, metoprolol tartrate, naLOXone (NARCAN)  injection, ondansetron **OR** ondansetron (ZOFRAN) IV, oxyCODONE, senna-docusate, sodium phosphate Medications Prior to Admission:  Prior to Admission medications   Medication Sig Start Date End Date Taking? Authorizing Provider  Cholecalciferol (VITAMIN D3) 1.25 MG (50000 UT) CAPS Take 50,000 Units by mouth every 30 (thirty) days.   Yes [provider]  citalopram (CELEXA) 10 MG tablet Take 0.5 tablets (5 mg total) by mouth daily. 11/22/18  Yes Tower, Wynelle Fanny, MD  clopidogrel (PLAVIX) 75 MG tablet TAKE ONE TABLET BY MOUTH EVERY MORNING WITH BREAKFAST. Patient taking differently: Take 75 mg by mouth daily. 11/22/18  Yes Tower, Wynelle Fanny, MD  dicyclomine (BENTYL) 10 MG capsule Take 10 mg by mouth 2 (two) times daily.    Yes [provider]  docusate sodium (COLACE) 100 MG capsule Take 100 mg by mouth daily.   Yes [provider]  fluticasone (FLONASE) 50 MCG/ACT nasal spray Place 1 spray into both nostrils daily.  Yes [provider]  gabapentin (NEURONTIN) 100 MG capsule Take 100 mg by mouth at bedtime. 10/31/20  Yes [provider]  imiquimod (ALDARA) 5 % cream Apply 1 application topically as directed. Apply to affected area as directed by dermatologist   Yes [provider]  levothyroxine (SYNTHROID) 50 MCG tablet Take 1 tablet (50 mcg total) by mouth daily before breakfast. 30 minutes before other medicines or supplements 11/22/18  Yes Tower, Wynelle Fanny, MD  metFORMIN (GLUCOPHAGE) 500 MG tablet Take 0.5 tablets (250 mg total) by mouth 2 (two) times daily with a meal. 11/22/18  Yes Tower, Wynelle Fanny, MD  Multiple Vitamins-Minerals (PRESERVISION AREDS 2 PO) Take 1 tablet by mouth 2 (two) times daily.   Yes [provider]  oxybutynin (DITROPAN-XL) 5 MG 24 hr tablet Take 5 mg by mouth daily. 10/31/20  Yes [provider]  pantoprazole (PROTONIX) 40 MG tablet Take 1 tablet (40 mg total) by mouth daily. 11/22/18  Yes Tower, Marne A, MD  polyethylene glycol powder (GLYCOLAX/MIRALAX) powder DISSOLVE ONE (1) CAPFUL (17GM) INTO 4 TO8 OUNCES OF FLUID AND TAKE BYMOUTH ONCE DAILY AS DIRECTED Patient taking differently: Take 17 g by mouth daily. DISSOLVE ONE (1) CAPFUL (17GM) INTO 4 TO8 OUNCES OF FLUID AND TAKE BYMOUTH ONCE DAILY AS DIRECTED 03/23/17  Yes Tower, Wynelle Fanny, MD  polyvinyl alcohol (LIQUIFILM TEARS) 1.4 % ophthalmic solution Place 1 drop into both eyes 2 (two) times daily.   Yes [provider]  Probiotic Product (ALIGN) 4 MG CAPS Take 4 mg by mouth daily.    Yes [provider]  simvastatin (ZOCOR) 20 MG tablet Take 1 tablet (20 mg total) by mouth at bedtime. 11/22/18  Yes Tower, Wynelle Fanny, MD  sulfamethoxazole-trimethoprim (BACTRIM DS) 800-160  MG tablet Take 1 tablet by mouth 2 (two) times daily. 10/31/20  Yes [provider]  vitamin B-12 (CYANOCOBALAMIN) 500 MCG tablet Take 500 mcg by mouth daily.   Yes [provider]  acetaminophen (TYLENOL) 325 MG tablet Take 2 tablets (650 mg total) by mouth every 6 (six) hours as needed for mild pain (or Fever >/= 101). 03/02/16   Laqueta Linden, MD  alum & mag hydroxide-simeth (MAALOX/MYLANTA) 200-200-20 MG/5ML suspension Take 30 mLs by mouth every 6 (six) hours as needed (gas, abdominal pain).    [provider]  Blood Glucose Monitoring Suppl (FREESTYLE LITE) DEVI USE AS DIRECTED TO CHECK BLOOD SUGAR ONCE A DAY AND AS NEEDED (DX. E11.9) 06/16/18   Tower, Marne A, MD  glucose blood (FREESTYLE LITE) test strip USE AS DIRECTED TO CHECK BLOOD SUGAR ONCE A DAY AND AS NEEDED (DX. E11.9) 06/16/18   Tower, Wynelle Fanny, MD  Lancets (FREESTYLE) lancets USE AS DIRECTED TO CHECK BLOOD SUGAR ONCE A DAY AND AS NEEDED (DX. E11.9) 06/16/18   Tower, Wynelle Fanny, MD  omeprazole (PRILOSEC) 20 MG capsule Take 20 mg by mouth daily. Patient not taking: No sig reported    [provider]  sennosides-docusate sodium (SENOKOT-S) 8.6-50 MG tablet Take 1 tablet by mouth daily as needed for constipation.    [provider]   Allergies  Allergen Reactions   Sulfonamide Derivatives Itching   Aspirin Other (See Comments)    Burning and pain in stomach   Review of Systems  Unable to perform ROS  Physical Exam Constitutional:      Comments: Opens eyes briefly.   Pulmonary:     Effort: Pulmonary effort is normal.    Vital Signs:  BP (!) 100/49 (BP Location: Left Arm)   Pulse 75   Temp (!) 97.4 F (36.3 C) (Oral)   Resp 16   SpO2 100%  Pain Scale: PAINAD POSS *See Group Information*: 1-Acceptable,Awake and alert Pain Score: 0-No pain   SpO2: SpO2: 100 % O2 Device:SpO2: 100 % O2 Flow Rate: .O2 Flow Rate (L/min): 2 L/min  IO: Intake/output summary:  Intake/Output Summary  (Last 24 hours) at 11/07/2020 1447 Last data filed at 11/07/2020 1409 Gross per 24 hour  Intake 480 ml  Output 1480 ml  Net -1000 ml    LBM: Last BM Date: 11/07/20 Baseline Weight:   Most recent weight:         Time In: 2:00 Time Out: 2:55 Time Total: 55 min Greater than 50%  of this time was spent counseling and coordinating care related to the above assessment and plan.  Signed by: Asencion Gowda, NP   Please contact Palliative Medicine Team phone at [phone] for questions and concerns.  For individual provider: See Shea Evans

## 2020-11-07 NOTE — Progress Notes (Signed)
   11/07/20 0907  SLP Visit Information  SLP Received On 11/07/20  Reason Eval/Treat Not Completed Patient at procedure or test/unavailable. SLP attempt for cognitive-linguistic evaluation; nursing in room with pt attempting to place catheter, will continue efforts as schedule allows.    Deneise Lever, MS, Actor

## 2020-11-08 DIAGNOSIS — N179 Acute kidney failure, unspecified: Secondary | ICD-10-CM

## 2020-11-08 DIAGNOSIS — S72141S Displaced intertrochanteric fracture of right femur, sequela: Secondary | ICD-10-CM

## 2020-11-08 LAB — GLUCOSE, CAPILLARY
Glucose-Capillary: 180 mg/dL — ABNORMAL HIGH (ref 70–99)
Glucose-Capillary: 235 mg/dL — ABNORMAL HIGH (ref 70–99)

## 2020-11-08 LAB — SARS CORONAVIRUS 2 (TAT 6-24 HRS): SARS Coronavirus 2: NEGATIVE

## 2020-11-08 MED ORDER — APIXABAN 2.5 MG PO TABS: 2.5000 mg | ORAL_TABLET | Freq: Two times a day (BID) | ORAL | Status: AC

## 2020-11-08 MED ORDER — OXYCODONE HCL 5 MG PO TABS: 2.5000 mg | ORAL_TABLET | Freq: Four times a day (QID) | ORAL | 0 refills | Status: AC | PRN

## 2020-11-08 MED ORDER — CHLORHEXIDINE GLUCONATE CLOTH 2 % EX PADS
6.0000 | MEDICATED_PAD | Freq: Every day | CUTANEOUS | Status: DC
  Administered 2020-11-08: 6 via TOPICAL

## 2020-11-08 MED ORDER — METOPROLOL SUCCINATE ER 25 MG PO TB24: 25.0000 mg | ORAL_TABLET | Freq: Every day | ORAL | Status: AC

## 2020-11-08 NOTE — Plan of Care (Signed)
  Problem: Education: Goal: Knowledge of General Education information will improve Description: Including pain rating scale, medication(s)/side effects and non-pharmacologic comfort measures Outcome: Completed/Met   Problem: Health Behavior/Discharge Planning: Goal: Ability to manage health-related needs will improve Outcome: Completed/Met   Problem: Clinical Measurements: Goal: Ability to maintain clinical measurements within normal limits will improve Outcome: Completed/Met Goal: Will remain free from infection Outcome: Completed/Met Goal: Diagnostic test results will improve Outcome: Completed/Met Goal: Respiratory complications will improve Outcome: Completed/Met Goal: Cardiovascular complication will be avoided Outcome: Completed/Met   Problem: Activity: Goal: Risk for activity intolerance will decrease Outcome: Completed/Met   Problem: Nutrition: Goal: Adequate nutrition will be maintained Outcome: Completed/Met   Problem: Coping: Goal: Level of anxiety will decrease Outcome: Completed/Met   Problem: Elimination: Goal: Will not experience complications related to bowel motility Outcome: Completed/Met Goal: Will not experience complications related to urinary retention Outcome: Completed/Met   Problem: Pain Managment: Goal: General experience of comfort will improve Outcome: Completed/Met   Problem: Safety: Goal: Ability to remain free from injury will improve Outcome: Completed/Met   Problem: Skin Integrity: Goal: Risk for impaired skin integrity will decrease Outcome: Completed/Met   Problem: Education: Goal: Knowledge of the prescribed therapeutic regimen will improve Outcome: Completed/Met Goal: Understanding of discharge needs will improve Outcome: Completed/Met Goal: Individualized Educational Video(s) Outcome: Completed/Met   Problem: Activity: Goal: Ability to avoid complications of mobility impairment will improve Outcome:  Completed/Met Goal: Ability to tolerate increased activity will improve Outcome: Completed/Met   Problem: Clinical Measurements: Goal: Postoperative complications will be avoided or minimized Outcome: Completed/Met   Problem: Pain Management: Goal: Pain level will decrease with appropriate interventions Outcome: Completed/Met   Problem: Skin Integrity: Goal: Will show signs of wound healing Outcome: Completed/Met   

## 2020-11-08 NOTE — NC FL2 (Signed)
[place] LEVEL OF CARE SCREENING TOOL     IDENTIFICATION  Patient Name: Lauren [name] Birthdate: Apr 16, 1928 Sex: female Admission Date (Current Location): 11/01/2020  [facility] and [place] Number:  Engineering geologist and Address:  [facility], [address] [place],  [address]      Provider Number: [id number]  Attending Physician Name and Address:  Albertine Patricia, MD  Relative Name and Phone Number:  [name] Niece [phone]   [phone]    Current Level of Care: Hospital Recommended Level of Care: [place] Prior Approval Number:    Date Approved/Denied:   PASRR Number: [id number]  Discharge Plan:      Current Diagnoses: Patient Active Problem List   Diagnosis Date Noted   Protein-calorie malnutrition, severe 11/07/2020   Cerebrovascular accident (CVA) due to bilateral embolism of vertebral arteries (HCC)    Paroxysmal atrial fibrillation ([place])    Acute blood loss anemia    Lung nodules    Dysarthria    AKI (acute kidney injury) ([place])    Drop in hemoglobin    Lethargy    Closed hip fracture requiring operative repair, right, sequela    Wheeze    Depression    Closed intertrochanteric fracture of right hip ([place]) 11/01/2020   Exposure to COVID-19 virus 12/26/2018   Fall at home, initial encounter 12/16/2018   Exposure to communicable disease 12/16/2018   Diabetic neuropathy ([place]) 11/22/2018   Weight loss 08/01/2018   Bloating 11/30/2017   Right sided weakness 09/07/2017   Hypothyroid 68/04/7516   Diastolic dysfunction 00/17/4944   Urinary frequency 09/17/2016   Hx of completed stroke 08/15/2016   HTN (hypertension) 08/15/2016   GERD (gastroesophageal reflux disease) 08/06/2015   Hearing loss 08/06/2015   Estrogen deficiency 05/01/2014   Left knee pain 09/22/2013   Encounter for Medicare annual wellness exam 04/04/2013   Encounter for examination for admission to assisted  living facility 04/04/2013   History of melanoma excision 09/27/2012   History of breast cancer, left 01/08/2011   Chronic abdominal pain 09/17/2010   History of pelvic fracture 09/17/2010   DEGENERATIVE JOINT DISEASE 02/13/2010   Migraine variant 11/06/2009   Osteopenia 10/18/2008   Transient ischemic attack 09/10/2008   Type 2 diabetes mellitus with hyperlipidemia ([place]) 10/06/2007   VENOUS INSUFFICIENCY 10/06/2007   Diverticulosis of colon 10/06/2007   FIBROCYSTIC BREAST DISEASE 10/06/2007   Hyperlipidemia associated with type 2 diabetes mellitus ([place]) 11/29/2006    Orientation RESPIRATION BLADDER Height & Weight     Self, Time, Situation, Place  O2 External catheter, Incontinent Weight: 44 kg Height:     BEHAVIORAL SYMPTOMS/MOOD NEUROLOGICAL BOWEL NUTRITION STATUS        Diet (soft diet; thin liquids)  AMBULATORY STATUS COMMUNICATION OF NEEDS Skin   Extensive Assist Verbally Surgical wounds (incision R hip)                       Personal Care Assistance Level of Assistance  Bathing, Feeding, Dressing Bathing Assistance: Maximum assistance Feeding assistance: Limited assistance Dressing Assistance: Maximum assistance     Functional Limitations Info             SPECIAL CARE FACTORS FREQUENCY  PT (By licensed PT), OT (By licensed OT)     PT Frequency: 5 x/week OT Frequency: 5 x/week            Contractures      Additional Factors Info  Code Status, Allergies  Code Status Info: DNR Allergies Info: sulfonamide derivatives, aspirin           Current Medications (11/08/2020):  This is the current hospital active medication list Current Facility-Administered Medications  Medication Dose Route Frequency Provider Last Rate Last Admin   0.9 %  sodium chloride infusion   Intravenous PRN Loletha Grayer, MD 10 mL/hr at 11/03/20 0523 Infusion Verify at 11/03/20 0523   acetaminophen (TYLENOL) tablet 325-650 mg  325-650 mg Oral Q6H PRN Corky Mull, MD   650  mg at 11/03/20 2150   apixaban (ELIQUIS) tablet 2.5 mg  2.5 mg Oral BID Bhagat, Srishti L, MD   2.5 mg at 11/08/20 3151   bisacodyl (DULCOLAX) suppository 10 mg  10 mg Rectal Daily PRN Poggi, Marshall Cork, MD       Chlorhexidine Gluconate Cloth 2 % PADS 6 each  6 each Topical Daily Mansy, Arvella Merles, MD   6 each at 11/08/20 0940   citalopram (CELEXA) tablet 5 mg  5 mg Oral Daily Poggi, Marshall Cork, MD   5 mg at 11/08/20 7616   dicyclomine (BENTYL) capsule 10 mg  10 mg Oral BID Corky Mull, MD   10 mg at 11/08/20 0737   docusate sodium (COLACE) capsule 100 mg  100 mg Oral BID Corky Mull, MD   100 mg at 11/08/20 1062   feeding supplement (NEPRO CARB STEADY) liquid 237 mL  237 mL Oral BID BM Elgergawy, Silver Huguenin, MD   237 mL at 11/07/20 1257   ferrous [id number] C-folic acid (TRINSICON / FOLTRIN) capsule 1 capsule  1 capsule Oral BID Duanne Guess, PA-C   1 capsule at 11/08/20 8299   food thickener (SIMPLYTHICK (HONEY/LEVEL 3/MODERATELY THICK)) 1 packet  1 packet Oral PRN Elgergawy, Silver Huguenin, MD       insulin aspart (novoLOG) injection 0-5 Units  0-5 Units Subcutaneous QHS Loletha Grayer, MD   3 Units at 11/07/20 2246   insulin aspart (novoLOG) injection 0-9 Units  0-9 Units Subcutaneous TID WC Thompson, Amy C, RPH   2 Units at 11/08/20 0940   ipratropium-albuterol (DUONEB) 0.5-2.5 (3) MG/3ML nebulizer solution 3 mL  3 mL Nebulization Q6H PRN Wieting, Richard, MD       levothyroxine (SYNTHROID) tablet 50 mcg  50 mcg Oral Q0600 Corky Mull, MD   50 mcg at 11/08/20 3716   magnesium hydroxide (MILK OF MAGNESIA) suspension 30 mL  30 mL Oral Daily PRN Poggi, Marshall Cork, MD       methocarbamol (ROBAXIN) tablet 500 mg  500 mg Oral Q6H PRN Poggi, Marshall Cork, MD       Or   methocarbamol (ROBAXIN) 500 mg in dextrose 5 % 50 mL IVPB  500 mg Intravenous Q6H PRN Poggi, Marshall Cork, MD       metoprolol succinate (TOPROL-XL) 24 hr tablet 25 mg  25 mg Oral Daily Loletha Grayer, MD   25 mg at 11/08/20 0938   metoprolol  tartrate (LOPRESSOR) injection 5 mg  5 mg Intravenous Q1H PRN Loletha Grayer, MD   5 mg at 11/06/20 0553   multivitamin with minerals tablet 1 tablet  1 tablet Oral Daily Poggi, Marshall Cork, MD   1 tablet at 11/08/20 9678   multivitamin-lutein (OCUVITE-LUTEIN) capsule 1 capsule  1 capsule Oral BID Poggi, Marshall Cork, MD   1 capsule at 11/08/20 0939   naloxone (NARCAN) injection 0.4 mg  0.4 mg Intravenous PRN Loletha Grayer, MD   0.4 mg at 11/04/20  0827   ondansetron (ZOFRAN) tablet 4 mg  4 mg Oral Q6H PRN Poggi, Marshall Cork, MD       Or   ondansetron [facility]) injection 4 mg  4 mg Intravenous Q6H PRN Poggi, Marshall Cork, MD   4 mg at 11/02/20 1231   oxyCODONE (Oxy IR/ROXICODONE) immediate release tablet 2.5 mg  2.5 mg Oral Q6H PRN Wieting, Richard, MD       pantoprazole (PROTONIX) EC tablet 40 mg  40 mg Oral Daily Poggi, Marshall Cork, MD   40 mg at 11/08/20 6712   polyethylene glycol (MIRALAX / GLYCOLAX) packet 17 g  17 g Oral Daily Poggi, Marshall Cork, MD   17 g at 11/08/20 4580   polyvinyl alcohol (LIQUIFILM TEARS) 1.4 % ophthalmic solution 1 drop  1 drop Both Eyes BID Poggi, Marshall Cork, MD   1 drop at 11/08/20 0941   senna-docusate (Senokot-S) tablet 1 tablet  1 tablet Oral Daily PRN Poggi, Marshall Cork, MD       simvastatin (ZOCOR) tablet 20 mg  20 mg Oral QHS Poggi, Marshall Cork, MD   20 mg at 11/07/20 2246   sodium phosphate (FLEET) 7-19 GM/118ML enema 1 enema  1 enema Rectal Once PRN Poggi, Marshall Cork, MD       vitamin B-12 (CYANOCOBALAMIN) tablet 500 mcg  500 mcg Oral Daily Poggi, Marshall Cork, MD   500 mcg at 11/08/20 9983   Vitamin D (Ergocalciferol) (DRISDOL) capsule 50,000 Units  50,000 Units Oral Q30 days Poggi, Marshall Cork, MD         Discharge Medications: Please see discharge summary for a list of discharge medications.  Relevant Imaging Results:  Relevant Lab Results:   Additional Information SS #: [id number]  Isiaha Greenup Jen Mow, RN

## 2020-11-08 NOTE — Progress Notes (Signed)
Pt was crying because she was unable to void. Bladder scan done and greater han 865mls noted. Foley was placed for acute urinary retention. Approximately 1070mls of clear urine was drained. Pt settled back to sleep and appeared comfortable. Will continue to monitor. Dr Sidney Ace aware.

## 2020-11-08 NOTE — Discharge Summary (Addendum)
Physician Discharge Summary  Lauren [name] UXN:[id number] DOB: 05/01/1928 DOA: 11/01/2020  PCP: Pcp, No  Admit date: 11/01/2020 Discharge date: 11/08/2020  Admitted From:Home Disposition:  [facility] [place] [facility]  Recommendations for Outpatient Follow-up:  Patient will be discharged on dysphagia 1 with nectar thick liquids, will need continued SLP follow-up Patient to follow with orthopedic Dr. Roland Rack in 10 days She is on 2 L nasal cannula on discharge, wean as tolerated Foley catheter inserted for urinary retention, please attempt voiding trial when she is more ambulatory Please consult palliative care medicine upon arrival to your facility, as overall prognosis extremely guarded given her frailty, deconditioning and multiple comorbidities  Home Health:NO Equipment/Devices:No  Discharge Condition:guarded CODE STATUS:DNR Diet recommendation: Dysphagia 1 with nectar thick  Brief/Interim Summary:  Patient admitted 11/01/2020 after fall on having right hip pain found to have a right hip fracture.  Dr. Roland Rack did aReduction and internal fixation of intertrochanteric right hip fracture on 11/02/2020.  Early morning on 11/04/2020 had mental status change and inability to talk.  Initial CT scan was negative.  I consulted neurology and a code stroke was called.  CT angio did not show any stroke but later that day MRI did show multiple strokes in the left parasagittal frontal lobe, superior lateral left thalamus and bilateral occipital lobes.  New chronic microhemorrhages and advanced chronic microvascular ischemic changes.  New onset atrial fibrillation seen on telemetry.  Started on Eliquis  Multiple embolic strokes seen on MRI.  Juliane but greatly appreciated, in the setting of new onset A. fib, recommendation to start for anticoagulation, Plavix has been discontinued 6/15, and patient started on Eliquis 2.5 mg oral twice daily (given age above 22, and weight less than 60 kg) .  Her  diet was downgraded to dysphagia 1 with nectar thick.  Dysphagia  - due to above, she is on dysphagia 1 nectar thick, will need continued follow-up by SLP at the facility   Right hip fracture requiring operative repair.   -Follow-up with orthopedic as an outpatient.   New-onset atrial fibrillation.   - Started on low-dose Toprol.  Currently on full dose Eliquis for anticoagulation .  Echocardiogram shows good ejection fraction.   Type 2 diabetes mellitus with hyperlipidemia.   - resume metformin.  Continue simvastatin.  LDL 34.   Acute blood loss anemia -Expected post operative loss, hemoglobin dropped to 7.4, she received 1 unit PRBC been stable at 10.9 this morning   Leukocytosis -Procalcitonin within normal limit, negative UA and chest x-ray, will continue to monitor, she is nontoxic-appearing   Urinary retention -Required Foley catheter insertion due to recurrent retention, will need voiding trial once more ambulatory   Acute kidney injury.  This has improved with IV fluid.  Creatinine went as high as 1.01 and down to 0 0.48.   Lung nodule seen on CT scan of the head and neck.  Will end up needing a CT scan of the chest in 3 to 78-month.   Hypothyroidism unspecified on levothyroxine   Depression on Celexa  Goals of care discussion -I have discussed with niece [name] on 6/16, overall patient is frail, deconditioned with multiple comorbidities, long-term prognosis remains poor, and she understands that, but for now we will continue with current medical management, treat with his treatable, and patient to be discharged with palliative follow-up at the facility, and if she has decline in her status then likely she will need to be on hospice.    Discharge Diagnoses:  Principal Problem:   Closed intertrochanteric fracture of right hip (HCC) Active Problems:   Type 2 diabetes mellitus with hyperlipidemia (HCC)   GERD (gastroesophageal reflux disease)   Hx of completed stroke    HTN (hypertension)   Hypothyroid   Right sided weakness   Lethargy   Closed hip fracture requiring operative repair, right, sequela   Wheeze   Depression   AKI (acute kidney injury) ([place])   Drop in hemoglobin   Dysarthria   Cerebrovascular accident (CVA) due to bilateral embolism of vertebral arteries (HCC)   Paroxysmal atrial fibrillation ([place])   Acute blood loss anemia   Lung nodules   Protein-calorie malnutrition, severe    Discharge Instructions  Discharge Instructions     Diet - low sodium heart healthy   Complete by: As directed    Discharge wound care:   Complete by: As directed    Reinforce dressing at hip surgical site.   Increase activity slowly   Complete by: As directed       Allergies as of 11/08/2020       Reactions   Sulfonamide Derivatives Itching   Aspirin Other (See Comments)   Burning and pain in stomach        Medication List     STOP taking these medications    clopidogrel 75 MG tablet Commonly known as: PLAVIX   omeprazole 20 MG capsule Commonly known as: PRILOSEC   sulfamethoxazole-trimethoprim 800-160 MG tablet Commonly known as: BACTRIM DS       TAKE these medications    acetaminophen 325 MG tablet Commonly known as: TYLENOL Take 2 tablets (650 mg total) by mouth every 6 (six) hours as needed for mild pain (or Fever >/= 101).   Align 4 MG Caps Take 4 mg by mouth daily.   alum & mag hydroxide-simeth 200-200-20 MG/5ML suspension Commonly known as: MAALOX/MYLANTA Take 30 mLs by mouth every 6 (six) hours as needed (gas, abdominal pain).   apixaban 2.5 MG Tabs tablet Commonly known as: ELIQUIS Take 1 tablet (2.5 mg total) by mouth 2 (two) times daily.   citalopram 10 MG tablet Commonly known as: CELEXA Take 0.5 tablets (5 mg total) by mouth daily.   dicyclomine 10 MG capsule Commonly known as: BENTYL Take 10 mg by mouth 2 (two) times daily.   docusate sodium 100 MG capsule Commonly known as: COLACE Take 100 mg by  mouth daily.   fluticasone 50 MCG/ACT nasal spray Commonly known as: FLONASE Place 1 spray into both nostrils daily.   freestyle lancets USE AS DIRECTED TO CHECK BLOOD SUGAR ONCE A DAY AND AS NEEDED (DX. E11.9)   FreeStyle Lite Devi USE AS DIRECTED TO CHECK BLOOD SUGAR ONCE A DAY AND AS NEEDED (DX. E11.9)   gabapentin 100 MG capsule Commonly known as: NEURONTIN Take 100 mg by mouth at bedtime.   glucose blood test strip Commonly known as: FREESTYLE LITE USE AS DIRECTED TO CHECK BLOOD SUGAR ONCE A DAY AND AS NEEDED (DX. E11.9)   imiquimod 5 % cream Commonly known as: ALDARA Apply 1 application topically as directed. Apply to affected area as directed by dermatologist   levothyroxine 50 MCG tablet Commonly known as: SYNTHROID Take 1 tablet (50 mcg total) by mouth daily before breakfast. 30 minutes before other medicines or supplements   metFORMIN 500 MG tablet Commonly known as: GLUCOPHAGE Take 0.5 tablets (250 mg total) by mouth 2 (two) times daily with a meal.   metoprolol succinate 25 MG 24 hr tablet  Commonly known as: TOPROL-XL Take 1 tablet (25 mg total) by mouth daily. Start taking on: November 09, 2020   oxybutynin 5 MG 24 hr tablet Commonly known as: DITROPAN-XL Take 5 mg by mouth daily.   oxyCODONE 5 MG immediate release tablet Commonly known as: Oxy IR/ROXICODONE Take 0.5 tablets (2.5 mg total) by mouth every 6 (six) hours as needed for severe pain.   pantoprazole 40 MG tablet Commonly known as: PROTONIX Take 1 tablet (40 mg total) by mouth daily.   polyethylene glycol powder 17 GM/SCOOP powder Commonly known as: GLYCOLAX/MIRALAX DISSOLVE ONE (1) CAPFUL (17GM) INTO 4 TO8 OUNCES OF FLUID AND TAKE BYMOUTH ONCE DAILY AS DIRECTED What changed: See the new instructions.   polyvinyl alcohol 1.4 % ophthalmic solution Commonly known as: LIQUIFILM TEARS Place 1 drop into both eyes 2 (two) times daily.   PRESERVISION AREDS 2 PO Take 1 tablet by mouth 2 (two) times  daily.   sennosides-docusate sodium 8.6-50 MG tablet Commonly known as: SENOKOT-S Take 1 tablet by mouth daily as needed for constipation.   simvastatin 20 MG tablet Commonly known as: ZOCOR Take 1 tablet (20 mg total) by mouth at bedtime.   vitamin B-12 500 MCG tablet Commonly known as: CYANOCOBALAMIN Take 500 mcg by mouth daily.   Vitamin D3 1.25 MG (50000 UT) Caps Take 50,000 Units by mouth every 30 (thirty) days.               Discharge Care Instructions  (From admission, onward)           Start     Ordered   11/08/20 0000  Discharge wound care:       Comments: Reinforce dressing at hip surgical site.   11/08/20 1206            Contact information for follow-up providers     Poggi, Marshall Cork, MD Follow up.   Specialty: Orthopedic Surgery Contact information: [place] [address] [phone]              Contact information for after-discharge care     Destination     [facility] .   Service: Skilled Nursing Contact information: [place] [address] [phone]                    Allergies  Allergen Reactions   Sulfonamide Derivatives Itching   Aspirin Other (See Comments)    Burning and pain in stomach    Consultations: Orthopedic Palliative Neurology   Procedures/Studies: DG Chest 1 View  Result Date: 11/01/2020 CLINICAL DATA:  Fall. EXAM: CHEST  1 VIEW COMPARISON:  April 20, 2017 FINDINGS: The heart size and mediastinal contours are within normal limits. Both lungs are clear. The visualized skeletal structures are unremarkable. IMPRESSION: No active disease. Electronically Signed   By: Dorise Bullion III M.D   On: 11/01/2020 13:35   CT HEAD WO CONTRAST  Result Date: 11/04/2020 CLINICAL DATA:  Mental status change with unknown cause EXAM: CT HEAD WITHOUT CONTRAST TECHNIQUE: Contiguous axial images were  obtained from the base of the skull through the vertex without intravenous contrast. COMPARISON:  Brain MRI 08/15/2016 FINDINGS: Brain: No evidence of acute infarction, hemorrhage, hydrocephalus, extra-axial collection or mass lesion/mass effect. Chronic small vessel ischemia in the deep cerebral white matter. Chronic lacunar infarct in the left corona radiata. Vascular: No hyperdense vessel or unexpected calcification. Skull: Normal. Negative for fracture or  focal lesion. Sinuses/Orbits: No acute finding. IMPRESSION: 1. No acute or reversible finding. 2. Chronic small vessel disease. Electronically Signed   By: Monte Fantasia M.D.   On: 11/04/2020 05:47   MR BRAIN WO CONTRAST  Result Date: 11/04/2020 CLINICAL DATA:  Code stroke follow-up EXAM: MRI HEAD WITHOUT CONTRAST TECHNIQUE: Multiplanar, multiecho pulse sequences of the brain and surrounding structures were obtained without intravenous contrast. COMPARISON:  08/15/2016 FINDINGS: Brain: Minimal punctate foci of diffusion hyperintensity are present in the parasagittal left frontal lobe, superolateral left thalamus, and bilateral occipital lobes. There are new foci of susceptibility hypointensity in the cerebellum, pons, and thalamus likely reflecting chronic microhemorrhages secondary to hypertension. Confluent areas of T2 hyperintensity are present in the supratentorial greater than pontine white matter and while nonspecific, probably reflect advanced chronic microvascular ischemic changes. Chronic small vessel infarcts of the deep cerebral white matter bilaterally. There is no intracranial mass or mass effect. There is no hydrocephalus or extra-axial fluid collection. Prominence of the ventricles and sulci reflects generalized parenchymal volume loss. Vascular: Major vessel flow voids at the skull base are preserved. Skull and upper cervical spine: Normal marrow signal is preserved. Sinuses/Orbits: Layering secretions in the right sphenoid sinus. Minor  mucosal thickening elsewhere. Bilateral lens replacements. Other: Sella is unremarkable. Minor patchy right mastoid fluid opacification. IMPRESSION: Few punctate acute to subacute infarcts involving parasagittal left frontal lobe, superolateral left thalamus, bilateral occipital lobes. New chronic microhemorrhages in a pattern suggesting sequelae of hypertension. Advanced chronic microvascular ischemic changes. Chronic infarcts of the deep cerebral white matter bilaterally. Electronically Signed   By: Macy Mis M.D.   On: 11/04/2020 14:13   DG Chest Port 1 View  Result Date: 11/07/2020 CLINICAL DATA:  Leukocytosis EXAM: PORTABLE CHEST 1 VIEW COMPARISON:  11/03/2020 FINDINGS: Cardiac shadow is within normal limits. Lungs are well aerated bilaterally. No focal infiltrate or sizable effusion is seen. No bony abnormality is noted. IMPRESSION: No active disease. Electronically Signed   By: Inez Catalina M.D.   On: 11/07/2020 08:12   DG Chest Port 1 View  Result Date: 11/03/2020 CLINICAL DATA:  87 year old female with a history of acute onset cough EXAM: PORTABLE CHEST 1 VIEW COMPARISON:  11/01/2020 FINDINGS: Cardiomediastinal silhouette unchanged in size and contour. Coarsened interstitial markings of the lungs. No pneumothorax or pleural effusion. No new confluent airspace disease. No displaced fracture. IMPRESSION: Similar appearance of the chest x-ray to the prior with no evidence of acute cardiopulmonary disease Electronically Signed   By: Corrie Mckusick D.O.   On: 11/03/2020 09:48   ECHOCARDIOGRAM COMPLETE  Result Date: 11/05/2020    ECHOCARDIOGRAM REPORT   Patient Name:   Lauren [name] Date of Exam: 11/05/2020 Medical Rec #:  [id number]       Height:       63.0 in Accession #:    [id number]      Weight:       122.0 lb Date of Birth:  1928/04/03       BSA:          1.567 m Patient Age:    4 years        BP:           147/64 mmHg Patient Gender: F               HR:           79 bpm. Exam Location:   [facility] Procedure: 2D Echo, Cardiac Doppler and Color Doppler Indications:  Stroke I63.9  History:         Patient has prior history of Echocardiogram examinations, most                  recent 08/16/2016. TIA; Risk Factors:Diabetes.  Sonographer:     Sherrie Sport RDCS (AE) Referring Phys:  [id number] Loletha Grayer Diagnosing Phys: Bartholome Bill MD  Sonographer Comments: Suboptimal apical window. IMPRESSIONS  1. Left ventricular ejection fraction, by estimation, is 65 to 70%. The left ventricle has normal function. The left ventricle has no regional wall motion abnormalities. There is moderate left ventricular hypertrophy. Left ventricular diastolic parameters are consistent with Grade I diastolic dysfunction (impaired relaxation).  2. Right ventricular systolic function is normal. The right ventricular size is mildly enlarged.  3. Left atrial size was mildly dilated.  4. Right atrial size was mildly dilated.  5. The mitral valve is grossly normal. Trivial mitral valve regurgitation.  6. Tricuspid valve regurgitation is mild to moderate.  7. The aortic valve is tricuspid. Aortic valve regurgitation is mild. FINDINGS  Left Ventricle: Left ventricular ejection fraction, by estimation, is 65 to 70%. The left ventricle has normal function. The left ventricle has no regional wall motion abnormalities. The left ventricular internal cavity size was normal in size. There is  moderate left ventricular hypertrophy. Left ventricular diastolic parameters are consistent with Grade I diastolic dysfunction (impaired relaxation). Right Ventricle: The right ventricular size is mildly enlarged. No increase in right ventricular wall thickness. Right ventricular systolic function is normal. Left Atrium: Left atrial size was mildly dilated. Right Atrium: Right atrial size was mildly dilated. Pericardium: There is no evidence of pericardial effusion. Mitral Valve: The mitral valve is grossly normal. Trivial mitral valve regurgitation.  Tricuspid Valve: The tricuspid valve is grossly normal. Tricuspid valve regurgitation is mild to moderate. Aortic Valve: The aortic valve is tricuspid. Aortic valve regurgitation is mild. Aortic valve mean gradient measures 6.5 mmHg. Aortic valve peak gradient measures 11.7 mmHg. Aortic valve area, by VTI measures 2.31 cm. Pulmonic Valve: The pulmonic valve was not well visualized. Pulmonic valve regurgitation is mild. Aorta: The aortic root is normal in size and structure. IAS/Shunts: The atrial septum is grossly normal.  LEFT VENTRICLE PLAX 2D LVIDd:         3.10 cm  Diastology LVIDs:         1.90 cm  LV e' medial:    4.57 cm/s LV PW:         1.00 cm  LV E/e' medial:  18.1 LV IVS:        0.95 cm  LV e' lateral:   5.55 cm/s LVOT diam:     2.00 cm  LV E/e' lateral: 14.9 LV SV:         64 LV SV Index:   41 LVOT Area:     3.14 cm  RIGHT VENTRICLE RV Basal diam:  3.60 cm RV S prime:     15.00 cm/s TAPSE (M-mode): 3.2 cm LEFT ATRIUM           Index       RIGHT ATRIUM           Index LA diam:      2.70 cm 1.72 cm/m  RA Area:     17.80 cm LA Vol (A2C): 28.1 ml 17.93 ml/m RA Volume:   54.00 ml  34.45 ml/m LA Vol (A4C): 36.1 ml 23.03 ml/m  AORTIC VALVE  PULMONIC VALVE AV Area (Vmax):    1.50 cm     PV Vmax:        0.63 m/s AV Area (Vmean):   1.52 cm     PV Peak grad:   1.6 mmHg AV Area (VTI):     2.31 cm     RVOT Peak grad: 2 mmHg AV Vmax:           171.00 cm/s AV Vmean:          117.000 cm/s AV VTI:            0.278 m AV Peak Grad:      11.7 mmHg AV Mean Grad:      6.5 mmHg LVOT Vmax:         81.60 cm/s LVOT Vmean:        56.700 cm/s LVOT VTI:          0.204 m LVOT/AV VTI ratio: 0.73  AORTA Ao Root diam: 3.13 cm MITRAL VALVE                TRICUSPID VALVE MV Area (PHT): 3.00 cm     TR Peak grad:   39.9 mmHg MV Decel Time: 253 msec     TR Vmax:        316.00 cm/s MV E velocity: 82.70 cm/s MV A velocity: 124.00 cm/s  SHUNTS MV E/A ratio:  0.67         Systemic VTI:  0.20 m                              Systemic Diam: 2.00 cm Bartholome Bill MD Electronically signed by Bartholome Bill MD Signature Date/Time: 11/05/2020/10:44:40 AM    Final    DG HIP OPERATIVE UNILAT W OR W/O PELVIS RIGHT  Result Date: 11/02/2020 CLINICAL DATA:  Fixation of intertrochanteric fracture. EXAM: OPERATIVE right HIP (WITH PELVIS IF PERFORMED) 16 VIEWS TECHNIQUE: Fluoroscopic spot image(s) were submitted for interpretation post-operatively. COMPARISON:  Radiograph 11/01/2020 FINDINGS: Intraoperative spot films from the operating room demonstrate initial closed reduction the intertrochanteric fracture and subsequent placement of a lung intramedullary gamma nail and proximal dynamic hip screw. Single distal interlocking screw is also noted. Good position and alignment without complicating features. IMPRESSION: Internal fixation of the intertrochanteric fracture. Electronically Signed   By: Marijo Sanes M.D.   On: 11/02/2020 11:35   DG Hip Unilat  With Pelvis 2-3 Views Right  Result Date: 11/01/2020 CLINICAL DATA:  Fall.  Pain. EXAM: DG HIP (WITH OR WITHOUT PELVIS) 2-3V RIGHT COMPARISON:  None. FINDINGS: There is a comminuted intertrochanteric right hip fracture with displacement of fracture fragments. No dislocation. No other abnormalities. IMPRESSION: Comminuted displaced right hip intertrochanteric fracture. Electronically Signed   By: Dorise Bullion III M.D   On: 11/01/2020 13:33   CT ANGIO HEAD NECK W WO CM W PERF (CODE STROKE)  Result Date: 11/04/2020 CLINICAL DATA:  Neuro deficit, acute stroke suspected. Altered mental status. EXAM: CT ANGIOGRAPHY HEAD AND NECK CT PERFUSION BRAIN TECHNIQUE: Multidetector CT imaging of the head and neck was performed using the standard protocol during bolus administration of intravenous contrast. Multiplanar CT image reconstructions and MIPs were obtained to evaluate the vascular anatomy. Carotid stenosis measurements (when applicable) are obtained utilizing NASCET criteria, using the distal  internal carotid diameter as the denominator. Multiphase CT imaging of the brain was performed following IV bolus contrast injection. Subsequent parametric perfusion maps were calculated using  RAPID software. CONTRAST:  153mL OMNIPAQUE IOHEXOL 350 MG/ML SOLN COMPARISON:  Same day CT head.  MRA February 29, 2016. FINDINGS: CT HEAD FINDINGS Brain: No evidence of acute large vascular territory infarct. No acute hemorrhage. Similar chronic microvascular ischemic change in the white matter and lacunar infarcts in the left frontal white matter and potentially bilateral basal ganglia. No mass lesion or abnormal mass effect. Similar generalized atrophy with ex vacuo ventricular dilation. No hydrocephalus. Vascular: See below. Skull: No acute fracture. Sinuses/Orbits: Air-fluid level in the right sphenoid sinus. Other: Mastoid effusions. ASPECTS [facility] Stroke Program Early CT Score) total score (0-10 with 10 being normal): 10. Review of the MIP images confirms the above findings CTA NECK FINDINGS Aortic arch: Predominately calcific atherosclerosis. Great vessel origins are patent. Right carotid system: Predominately calcific atherosclerosis at the carotid bifurcation without greater than 50% stenosis. Tortuous ICA at the skull base. Left carotid system: Mixed noncalcific and calcific atherosclerosis of the carotid bifurcation without greater than 50% stenosis. Vertebral arteries: Codominant. No evidence of dissection, stenosis (50% or greater) or occlusion. Calcific atherosclerosis of the right vertebral artery origin. Skeleton: Diffuse osteopenia. Severe facet arthropathy at C4-C5 on the right with slight anterolisthesis at this level. Other neck: No acute abnormality. Upper chest: 4 mm pulmonary nodule in the right upper lobe (series 13, image 73). 6 mm pulmonary nodule in the posterolateral left upper lobe (series 13, image 21). 3 mm pulmonary nodule in the lateral left upper lobe (series 13, image 22). Dependent  ground-glass opacities, most likely atelectasis. Biapical pleuroparenchymal scarring. Review of the MIP images confirms the above findings CTA HEAD FINDINGS Anterior circulation: Calcific atherosclerosis of bilateral cavernous and paraclinoid ICAs. Nearly 50% stenosis on the right. Severe stenosis of the right M1 MCA (series 13, image 247). Severe stenosis of a distal right M2 MCA branch (series 14, image 137; series 13, image 255). MCAs and ACAs are otherwise patent. Approximately 2-3 mm outpouching arising from the posteromedial aspect of the left supraclinoid ICA (series 15, image 92 and series 13, image 241), compatible with aneurysm versus infundibulum with vessel too small to see. Right posterior communicating artery infundibulum. Posterior circulation: Bilateral intradural vertebral arteries, basilar artery, and posterior cerebral arteries are patent. Moderate to severe bilateral P2 PCA stenosis (series 13, image 236 and series 14, image 133). No aneurysm. Venous sinuses: As permitted by contrast timing, patent. Small right transverse/sigmoid sinuses. Review of the MIP images confirms the above findings CT Brain Perfusion Findings: ASPECTS: 10 CBF (<30%) Volume: 87mL Perfusion (Tmax>6.0s) volume: 38mL Mismatch Volume: 25mL Infarction Location:Non identified. Other: 14 mL area of mildly increased transit time (Tmax greater than 4 seconds), which correlates with the territory supplied by the severe distal right M2 MCA stenosis described below. IMPRESSION: CT head: 1. No evidence of acute large vascular territory infarct or acute hemorrhage. 2. Similar chronic microvascular ischemic disease and suspected remote white matter infarcts. 3. Air-fluid level in the right sphenoid sinus. CT perfusion: 1. No core infarct or penumbra detected by RAPID. 2. 14 mL area of mildly increased transit time (Tmax greater than 4 seconds), which correlates with the territory supplied by the severe distal right M2 MCA stenosis  described below. MRI could provide more sensitive evaluation for acute infarct in this area if clinically indicated. CTA Head: 1. Severe stenosis of the right M1 MCA. 2. Severe stenosis of a distal right M2 MCA branch. 3. Moderate to severe bilateral P2 PCA stenosis. 4. Bilateral cavernous/paraclinoid ICA atherosclerosis with nearly 50% stenosis on  the right. 5. Approximately 2-3 mm outpouching arising from the posteromedial aspect of the left supraclinoid ICA (series 15, image 92 and series 13, image 241), compatible with aneurysm versus infundibulum with vessel too small to see. 6. Approximately 2-3 mm left supraclinoid aneurysm versus infundibulum with vessel too small to see. CTA Neck: 1. Bilateral carotid bifurcation atherosclerosis without significant (greater than 50%) stenosis. 2. Multiple pulmonary nodules in the visualized bilateral upper lobes, described above and measuring 6 mm. Non-contrast chest CT at 3-6 months is recommended to assess stability and further evaluate the remaining lungs. If the nodules are stable at time of repeat CT, then future CT at 18-24 months (from today's scan) is considered optional for low-risk patients, but is recommended for high-risk patients. This recommendation follows the consensus statement: Guidelines for Management of Incidental Pulmonary Nodules Detected on CT Images: From the Fleischner Society 2017; Radiology 2017; 284:228-243. Code stroke imaging results were communicated on 11/04/2020 at 9:40 am to provider Bhagat via telephone, who verbally acknowledged these results. Electronically Signed   By: Margaretha Sheffield MD   On: 11/04/2020 09:45     Subjective:  Denies any complaints, but is confused, she required Foley catheter insertion yesterday due to recurrent urinary retention. Discharge Exam: Vitals:   11/08/20 1123 11/08/20 1136  BP:  (!) 106/44  Pulse:  74  Resp:  16  Temp:  97.8 F (36.6 C)  SpO2: 97% 98%   Vitals:   11/08/20 0815 11/08/20  0935 11/08/20 1123 11/08/20 1136  BP: (!) 133/59   (!) 106/44  Pulse: 79   74  Resp: 16   16  Temp: 98.2 F (36.8 C)   97.8 F (36.6 C)  TempSrc: Oral     SpO2: 100%  97% 98%  Weight:  44 kg      General: Patient is confused, frail, deconditioned, no apparent distress Cardiovascular: RRR, S1/S2 +, no rubs, no gallops Respiratory: CTA bilaterally, no wheezing, no rhonchi Abdominal: Soft, NT, ND, bowel sounds + Extremities: no edema, no cyanosis    The results of significant diagnostics from this hospitalization (including imaging, microbiology, ancillary and laboratory) are listed below for reference.     Microbiology: Recent Results (from the past 240 hour(s))  Resp Panel by RT-PCR (Flu A&B, Covid) Nasopharyngeal Swab     Status: None   Collection Time: 11/01/20 12:48 PM   Specimen: Nasopharyngeal Swab; Nasopharyngeal(NP) swabs in vial transport medium  Result Value Ref Range Status   SARS Coronavirus 2 by RT PCR NEGATIVE NEGATIVE Final    Comment: (NOTE) SARS-CoV-2 target nucleic acids are NOT DETECTED.  The SARS-CoV-2 RNA is generally detectable in upper respiratory specimens during the acute phase of infection. The lowest concentration of SARS-CoV-2 viral copies this assay can detect is 138 copies/mL. A negative result does not preclude SARS-Cov-2 infection and should not be used as the sole basis for treatment or other patient management decisions. A negative result may occur with  improper specimen collection/handling, submission of specimen other than nasopharyngeal swab, presence of viral mutation(s) within the areas targeted by this assay, and inadequate number of viral copies(<138 copies/mL). A negative result must be combined with clinical observations, patient history, and epidemiological information. The expected result is Negative.  Fact Sheet for Patients:  EntrepreneurPulse.com.au  Fact Sheet for Healthcare Providers:   IncredibleEmployment.be  This test is no t yet approved or cleared by the [place] FDA and  has been authorized for detection and/or diagnosis of SARS-CoV-2 by FDA under  an Emergency Use Authorization (EUA). This EUA will remain  in effect (meaning this test can be used) for the duration of the COVID-19 declaration under Section 564(b)(1) of the Act, 21 U.S.C.section 360bbb-3(b)(1), unless the authorization is terminated  or revoked sooner.       Influenza A by PCR NEGATIVE NEGATIVE Final   Influenza B by PCR NEGATIVE NEGATIVE Final    Comment: (NOTE) The Xpert Xpress SARS-CoV-2/FLU/RSV plus assay is intended as an aid in the diagnosis of influenza from Nasopharyngeal swab specimens and should not be used as a sole basis for treatment. Nasal washings and aspirates are unacceptable for Xpert Xpress SARS-CoV-2/FLU/RSV testing.  Fact Sheet for Patients: EntrepreneurPulse.com.au  Fact Sheet for Healthcare Providers: IncredibleEmployment.be  This test is not yet approved or cleared by the [place] FDA and has been authorized for detection and/or diagnosis of SARS-CoV-2 by FDA under an Emergency Use Authorization (EUA). This EUA will remain in effect (meaning this test can be used) for the duration of the COVID-19 declaration under Section 564(b)(1) of the Act, 21 U.S.C. section 360bbb-3(b)(1), unless the authorization is terminated or revoked.  Performed at [facility], [address], [place], [place] [address]   Surgical PCR screen     Status: Abnormal   Collection Time: 11/02/20  5:53 AM   Specimen: Nasal Mucosa; Nasal Swab  Result Value Ref Range Status   MRSA, PCR POSITIVE (A) NEGATIVE Final    Comment: RESULT CALLED TO, READ BACK BY AND VERIFIED WITH: BREANNA CAMPBELL ON 11/02/20 AT 0730 QSD    Staphylococcus aureus POSITIVE (A) NEGATIVE Final    Comment: (NOTE) The Xpert SA Assay (FDA approved  for NASAL specimens in patients 25 years of age and older), is one component of a comprehensive surveillance program. It is not intended to diagnose infection nor to guide or monitor treatment. Performed at [facility], [place], [place] [address]   SARS CORONAVIRUS 2 (TAT 6-24 HRS) Nasopharyngeal Nasopharyngeal Swab     Status: None   Collection Time: 11/07/20  2:30 PM   Specimen: Nasopharyngeal Swab  Result Value Ref Range Status   SARS Coronavirus 2 NEGATIVE NEGATIVE Final    Comment: (NOTE) SARS-CoV-2 target nucleic acids are NOT DETECTED.  The SARS-CoV-2 RNA is generally detectable in upper and lower respiratory specimens during the acute phase of infection. Negative results do not preclude SARS-CoV-2 infection, do not rule out co-infections with other pathogens, and should not be used as the sole basis for treatment or other patient management decisions. Negative results must be combined with clinical observations, patient history, and epidemiological information. The expected result is Negative.  Fact Sheet for Patients: SugarRoll.be  Fact Sheet for Healthcare Providers: https://www.woods-mathews.com/  This test is not yet approved or cleared by the [place] FDA and  has been authorized for detection and/or diagnosis of SARS-CoV-2 by FDA under an Emergency Use Authorization (EUA). This EUA will remain  in effect (meaning this test can be used) for the duration of the COVID-19 declaration under Se ction 564(b)(1) of the Act, 21 U.S.C. section 360bbb-3(b)(1), unless the authorization is terminated or revoked sooner.  Performed at [facility], [place] [address] [place], [place] [address]      Labs: BNP (last 3 results) No results for input(s): BNP in the last 8760 hours. Basic Metabolic Panel: Recent Labs  Lab 11/03/20 0454 11/04/20 0456 11/05/20 0340 11/06/20 0804 11/07/20 0407  NA  130* 132* 133* 135 134*  K 4.1 4.1 4.0 3.4* 3.3*  CL 95* 97* 97* 92* 94*  CO2 30 30 30 30  32  GLUCOSE 155* 156* 106* 184* 147*  BUN 15 15 12 13 17   CREATININE 1.01* 0.61 0.52 0.48 0.62  CALCIUM 8.5* 8.5* 8.5* 9.1 9.0  MG  --  1.9  --   --   --    Liver Function Tests: Recent Labs  Lab 11/04/20 0456  AST 15  ALT <5  ALKPHOS 66  BILITOT 0.5  PROT 5.4*  ALBUMIN 2.6*   No results for input(s): LIPASE, AMYLASE in the last 168 hours. No results for input(s): AMMONIA in the last 168 hours. CBC: Recent Labs  Lab 11/03/20 0454 11/04/20 0456 11/05/20 0340 11/06/20 0804 11/07/20 0407  WBC 15.9* 14.2* 13.1* 16.1* 18.0*  HGB 8.3* 8.2* 7.4* 11.1* 10.9*  HCT 24.8* 24.6* 22.3* 33.2* 32.2*  MCV 89.9 90.4 89.9 88.8 89.0  PLT 155 149* 174 247 266   Cardiac Enzymes: No results for input(s): CKTOTAL, CKMB, CKMBINDEX, TROPONINI in the last 168 hours. BNP: Invalid input(s): POCBNP CBG: Recent Labs  Lab 11/07/20 1135 11/07/20 1650 11/07/20 2158 11/08/20 0808 11/08/20 1207  GLUCAP 283* 219* 260* 180* 235*   D-Dimer No results for input(s): DDIMER in the last 72 hours. Hgb A1c No results for input(s): HGBA1C in the last 72 hours. Lipid Profile No results for input(s): CHOL, HDL, LDLCALC, TRIG, CHOLHDL, LDLDIRECT in the last 72 hours. Thyroid function studies No results for input(s): TSH, T4TOTAL, T3FREE, THYROIDAB in the last 72 hours.  Invalid input(s): FREET3 Anemia work up No results for input(s): VITAMINB12, FOLATE, FERRITIN, TIBC, IRON, RETICCTPCT in the last 72 hours. Urinalysis    Component Value Date/Time   COLORURINE YELLOW (A) 11/07/2020 0905   APPEARANCEUR CLEAR (A) 11/07/2020 0905   LABSPEC 1.009 11/07/2020 0905   PHURINE 7.0 11/07/2020 0905   GLUCOSEU 50 (A) 11/07/2020 0905   HGBUR NEGATIVE 11/07/2020 0905   BILIRUBINUR NEGATIVE 11/07/2020 0905   BILIRUBINUR negative 06/14/2018 1507   KETONESUR NEGATIVE 11/07/2020 0905   PROTEINUR NEGATIVE 11/07/2020 0905    UROBILINOGEN 0.2 06/14/2018 1507   UROBILINOGEN 0.2 01/12/2013 0132   NITRITE NEGATIVE 11/07/2020 0905   LEUKOCYTESUR NEGATIVE 11/07/2020 0905   Sepsis Labs Invalid input(s): PROCALCITONIN,  WBC,  LACTICIDVEN Microbiology Recent Results (from the past 240 hour(s))  Resp Panel by RT-PCR (Flu A&B, Covid) Nasopharyngeal Swab     Status: None   Collection Time: 11/01/20 12:48 PM   Specimen: Nasopharyngeal Swab; Nasopharyngeal(NP) swabs in vial transport medium  Result Value Ref Range Status   SARS Coronavirus 2 by RT PCR NEGATIVE NEGATIVE Final    Comment: (NOTE) SARS-CoV-2 target nucleic acids are NOT DETECTED.  The SARS-CoV-2 RNA is generally detectable in upper respiratory specimens during the acute phase of infection. The lowest concentration of SARS-CoV-2 viral copies this assay can detect is 138 copies/mL. A negative result does not preclude SARS-Cov-2 infection and should not be used as the sole basis for treatment or other patient management decisions. A negative result may occur with  improper specimen collection/handling, submission of specimen other than nasopharyngeal swab, presence of viral mutation(s) within the areas targeted by this assay, and inadequate number of viral copies(<138 copies/mL). A negative result must be combined with clinical observations, patient history, and epidemiological information. The expected result is Negative.  Fact Sheet for Patients:  EntrepreneurPulse.com.au  Fact Sheet for Healthcare Providers:  IncredibleEmployment.be  This test is no t yet approved or cleared by  the [employer] and  has been authorized for detection and/or diagnosis of SARS-CoV-2 by FDA under an Emergency Use Authorization (EUA). This EUA will remain  in effect (meaning this test can be used) for the duration of the COVID-19 declaration under Section 564(b)(1) of the Act, 21 U.S.C.section 360bbb-3(b)(1), unless the  authorization is terminated  or revoked sooner.       Influenza A by PCR NEGATIVE NEGATIVE Final   Influenza B by PCR NEGATIVE NEGATIVE Final    Comment: (NOTE) The Xpert Xpress SARS-CoV-2/FLU/RSV plus assay is intended as an aid in the diagnosis of influenza from Nasopharyngeal swab specimens and should not be used as a sole basis for treatment. Nasal washings and aspirates are unacceptable for Xpert Xpress SARS-CoV-2/FLU/RSV testing.  Fact Sheet for Patients: EntrepreneurPulse.com.au  Fact Sheet for Healthcare Providers: IncredibleEmployment.be  This test is not yet approved or cleared by the [place] FDA and has been authorized for detection and/or diagnosis of SARS-CoV-2 by FDA under an Emergency Use Authorization (EUA). This EUA will remain in effect (meaning this test can be used) for the duration of the COVID-19 declaration under Section 564(b)(1) of the Act, 21 U.S.C. section 360bbb-3(b)(1), unless the authorization is terminated or revoked.  Performed at [facility], [address], [place], [place] [address]   Surgical PCR screen     Status: Abnormal   Collection Time: 11/02/20  5:53 AM   Specimen: Nasal Mucosa; Nasal Swab  Result Value Ref Range Status   MRSA, PCR POSITIVE (A) NEGATIVE Final    Comment: RESULT CALLED TO, READ BACK BY AND VERIFIED WITH: BREANNA CAMPBELL ON 11/02/20 AT 0730 QSD    Staphylococcus aureus POSITIVE (A) NEGATIVE Final    Comment: (NOTE) The Xpert SA Assay (FDA approved for NASAL specimens in patients 60 years of age and older), is one component of a comprehensive surveillance program. It is not intended to diagnose infection nor to guide or monitor treatment. Performed at [facility], [place], [place] [address]   SARS CORONAVIRUS 2 (TAT 6-24 HRS) Nasopharyngeal Nasopharyngeal Swab     Status: None   Collection Time: 11/07/20  2:30 PM   Specimen:  Nasopharyngeal Swab  Result Value Ref Range Status   SARS Coronavirus 2 NEGATIVE NEGATIVE Final    Comment: (NOTE) SARS-CoV-2 target nucleic acids are NOT DETECTED.  The SARS-CoV-2 RNA is generally detectable in upper and lower respiratory specimens during the acute phase of infection. Negative results do not preclude SARS-CoV-2 infection, do not rule out co-infections with other pathogens, and should not be used as the sole basis for treatment or other patient management decisions. Negative results must be combined with clinical observations, patient history, and epidemiological information. The expected result is Negative.  Fact Sheet for Patients: SugarRoll.be  Fact Sheet for Healthcare Providers: https://www.woods-mathews.com/  This test is not yet approved or cleared by the [place] FDA and  has been authorized for detection and/or diagnosis of SARS-CoV-2 by FDA under an Emergency Use Authorization (EUA). This EUA will remain  in effect (meaning this test can be used) for the duration of the COVID-19 declaration under Se ction 564(b)(1) of the Act, 21 U.S.C. section 360bbb-3(b)(1), unless the authorization is terminated or revoked sooner.  Performed at [facility], [place] [address], [place], [place] [address]      Time coordinating discharge: Over 30 minutes  SIGNED:   Phillips Climes, MD  [facility] 11/08/2020, 12:24 PM Pager  If 7PM-7AM, please contact night-coverage [email or web address] Password [email or web address]

## 2020-11-08 NOTE — Progress Notes (Signed)
Physical Therapy Treatment Patient Details Name: Lauren [name] MRN: [id number] DOB: 1927/10/02 Today's Date: 11/08/2020    History of Present Illness Pt admitted to [facility] on 11/01/20 for mechanical fall that resulted in R hip fx. Pt s/p R ORIF with IM nailing on 11/02/20 by Dr. Roland Rack. Significant PMH includes: depression, HTN, hypothyroidism, gout, gastritis, hx breast CA, hx of CVA with residual weakness, HLD, GERD, and DM.    PT Comments    Pt ready for session.  Participated in exercises as described below. She is able to get to EOB with mod a x 1.  Steady in sitting.  She did have a small BM during session and care is provided.  Stands with walker for care and mod a x 1 with progressive fatigue with increased time.  She is unable to step so stand pivot transfer to chair with max a x 1.  Positioned for comfort/lunch.   Follow Up Recommendations  SNF     Equipment Recommendations  Other (comment)    Recommendations for Other Services       Precautions / Restrictions Precautions Precautions: Fall Restrictions Weight Bearing Restrictions: Yes RLE Weight Bearing: Weight bearing as tolerated    Mobility  Bed Mobility Overal bed mobility: Needs Assistance Bed Mobility: Rolling;Supine to Sit Rolling: Mod assist   Supine to sit: Mod assist          Transfers Overall transfer level: Needs assistance Equipment used: Rolling walker (2 wheeled) Transfers: Sit to/from [facility] Sit to Stand: Mod assist;From elevated surface Stand pivot transfers: Max assist       General transfer comment: unable to step for transfers with walker.  Ambulation/Gait                 Stairs             Wheelchair Mobility    Modified Rankin (Stroke Patients Only)       Balance Overall balance assessment: Needs assistance Sitting-balance support: Bilateral upper extremity supported;Feet supported Sitting balance-Leahy Scale: Fair     Standing  balance support: Bilateral upper extremity supported;During functional activity Standing balance-Leahy Scale: Poor Standing balance comment: Required min-mod assist standing in RW                            Cognition Arousal/Alertness: Awake/alert Behavior During Therapy: WFL for tasks assessed/performed Overall Cognitive Status: History of cognitive impairments - at baseline                                        Exercises Other Exercises Other Exercises: BLE AAROM in supine x 10    General Comments        Pertinent Vitals/Pain Pain Assessment: Faces Faces Pain Scale: Hurts a little bit Pain Location: no pain at rest, minor discomfort with mobility but tolerates well. Pain Descriptors / Indicators: Sore;Grimacing Pain Intervention(s): Limited activity within patient's tolerance;Monitored during session;Repositioned    Home Living                      Prior Function            PT Goals (current goals can now be found in the care plan section) Progress towards PT goals: Progressing toward goals    Frequency    7X/week      PT Plan Current  plan remains appropriate    Co-evaluation              AM-PAC PT "6 Clicks" Mobility   Outcome Measure  Help needed turning from your back to your side while in a flat bed without using bedrails?: A Lot Help needed moving from lying on your back to sitting on the side of a flat bed without using bedrails?: A Lot Help needed moving to and from a bed to a chair (including a wheelchair)?: A Lot Help needed standing up from a chair using your arms (e.g., wheelchair or bedside chair)?: A Lot Help needed to walk in hospital room?: Total Help needed climbing 3-5 steps with a railing? : Total 6 Click Score: 10    End of Session Equipment Utilized During Treatment: Gait belt;Oxygen Activity Tolerance: Patient tolerated treatment well Patient left: in chair;with call bell/phone within  reach;with chair alarm set Nurse Communication: Mobility status PT Visit Diagnosis: Unsteadiness on feet (R26.81);Muscle weakness (generalized) (M62.81);Difficulty in walking, not elsewhere classified (R26.2);Pain Pain - Right/Left: Right Pain - part of body: Hip     Time: 3832-9191 PT Time Calculation (min) (ACUTE ONLY): 13 min  Charges:  $Therapeutic Activity: 8-22 mins                    Chesley Noon, PTA 11/08/20, 11:44 AM , 11:42 AM

## 2020-11-08 NOTE — Progress Notes (Signed)
Report called to Rowland at [place]. All instructions provided with questions answered; transport scheduled for 1500.

## 2020-11-08 NOTE — TOC Progression Note (Signed)
Transition of Care [facility]) - Progression Note    Patient Details  Name: Lauren [name] MRN: [id number] Date of Birth: 1928/01/12  Transition of Care [facility]) CM/SW Bee, RN Phone Number: 11/08/2020, 1:07 PM  Clinical Narrative:    Patient to be Dc to [facility] in [employer] today, EMS to pick up at 3 PM, Nurse to call report , [name] the niece is aware   Expected Discharge Plan: Skilled Nursing Facility Barriers to Discharge: Continued Medical Work up  Expected Discharge Plan and Services Expected Discharge Plan: [place] arrangements for the past 2 months: [place] Expected Discharge Date: 11/08/20                                     Social Determinants of Health (SDOH) Interventions    Readmission Risk Interventions No flowsheet data found.

## 2020-11-08 NOTE — Progress Notes (Signed)
Subjective: 6 Days Post-Op Procedure(s) (LRB): INTRAMEDULLARY (IM) NAIL INTERTROCHANTRIC (Right) Patient is resting comfortably this morning. Able to wake up and does answer some simple questions, denies any pain in the right hip this AM. MRI demonstrated signs of strokes, started on Eliquis yesterday. Patient underwent transfusion, Hg 10.9 Patient with urinary retention yesterday, I/O cath performed, 108mls of clear urine drained.  Objective: Vital signs in last 24 hours: Temp:  [97.4 F (36.3 C)-98.6 F (37 C)] 97.8 F (36.6 C) (06/17 0452) Pulse Rate:  [52-76] 73 (06/17 0452) Resp:  [15-20] 18 (06/17 0452) BP: (100-128)/(49-74) 128/60 (06/17 0452) SpO2:  [98 %-100 %] 98 % (06/17 0452)  Intake/Output from previous day: 06/16 0701 - 06/17 0700 In: 717 [P.O.:480; NG/GT:237] Out: 1780 [Urine:1780] Intake/Output this shift: No intake/output data recorded.  Recent Labs    11/06/20 0804 11/07/20 0407  HGB 11.1* 10.9*   Recent Labs    11/06/20 0804 11/07/20 0407  WBC 16.1* 18.0*  RBC 3.74* 3.62*  HCT 33.2* 32.2*  PLT 247 266   Recent Labs    11/06/20 0804 11/07/20 0407  NA 135 134*  K 3.4* 3.3*  CL 92* 94*  CO2 30 32  BUN 13 17  CREATININE 0.48 0.62  GLUCOSE 184* 147*  CALCIUM 9.1 9.0   No results for input(s): LABPT, INR in the last 72 hours.   EXAM General - Patient is Alert and Lacking Extremity - Intact pulses distally Incision: moderate drainage No cellulitis present Compartment soft Dressing - Moderate drainage noted to the proximal skin incision.  Dried bloody drainage.  Fresh honeycomb dressings applied this AM. No erythema. Thigh is soft to palpation. No signs of infection to the right hip. Motor Function - Dorsiflexion and plantarflexion intact to bilateral lower extremities. Abdomen is soft with normal bowel sounds. Negative Homan's to bilateral lower extremities.  Past Medical History:  Diagnosis Date   Breast cancer ([place])    left  breast   Chronic constipation    Chronic headaches    Diabetes mellitus    Fibrocystic breast    Gastritis    Gout    injection in the past   Hyperlipidemia    Melanoma in situ of left upper extremity (HCC)    Pelvic fracture (HCC)    TIA (transient ischemic attack) 08/2008   neg MRI/MRA and CT and carotid dopplers   Venous insufficiency    Assessment/Plan:   6 Days Post-Op Procedure(s) (LRB): INTRAMEDULLARY (IM) NAIL INTERTROCHANTRIC (Right) Principal Problem:   Closed intertrochanteric fracture of right hip (HCC) Active Problems:   Type 2 diabetes mellitus with hyperlipidemia (HCC)   GERD (gastroesophageal reflux disease)   Hx of completed stroke   HTN (hypertension)   Hypothyroid   Right sided weakness   Lethargy   Closed hip fracture requiring operative repair, right, sequela   Wheeze   Depression   AKI (acute kidney injury) ([place])   Drop in hemoglobin   Dysarthria   Cerebrovascular accident (CVA) due to bilateral embolism of vertebral arteries (HCC)   Paroxysmal atrial fibrillation ([place])   Acute blood loss anemia   Lung nodules   Protein-calorie malnutrition, severe  Estimated body mass index is 21.61 kg/m as calculated from the following:   Height as of 10/01/20: 5\' 3"  (1.6 m).   Weight as of 10/01/20: 55.3 kg.  Patient currently on Eliquis. Labs not yet available this AM. UA negative for UTI. Tylenol as needed for pain. Up with therapy as tolerated. Patient has had a  BM.  Upon discharge, staples can be removed on 11/15/20. Follow-up with [facility] orthopaedics in 6 weeks. Recommend continued pain regimen with tylenol. Discharge on Eliquis due to recent new onset A. Fib. Ortho will sign off at this time, don't hesitate to reach out if any questions or new issues arise.  DVT Prophylaxis - Foot Pumps, TED hose, and Eliquis Weight-Bearing as tolerated to right leg  J. Cameron Proud, PA-C [place] 11/08/2020, 7:43 AM

## 2020-11-08 NOTE — Discharge Instructions (Signed)

## 2020-11-15 ENCOUNTER — Non-Acute Institutional Stay: Payer: Medicare Other | Admitting: Hospice

## 2020-11-15 ENCOUNTER — Other Ambulatory Visit: Payer: Self-pay

## 2020-11-15 DIAGNOSIS — S72141D Displaced intertrochanteric fracture of right femur, subsequent encounter for closed fracture with routine healing: Secondary | ICD-10-CM

## 2020-11-15 DIAGNOSIS — M25551 Pain in right hip: Secondary | ICD-10-CM

## 2020-11-15 DIAGNOSIS — Z515 Encounter for palliative care: Secondary | ICD-10-CM

## 2020-11-15 DIAGNOSIS — R531 Weakness: Secondary | ICD-10-CM

## 2020-11-15 NOTE — Progress Notes (Signed)
[facility] Consult Note Telephone: [phone]  Fax: [phone]  PATIENT NAME: Lauren [name] [address] [place] [address] [phone] (home)  DOB: 1927/09/26 MRN: [id number]  PRIMARY CARE PROVIDER:    Dr. Leanna Battles   REFERRING PROVIDER:   Dr. Leanna Battles   RESPONSIBLE PARTY:   [facility] Information     Name Relation Home Work Normangee [name] [phone]  [phone]   Slaughter,Lauren [name] [phone]  [phone]   [name],Lauren Other [phone]  [phone]       I met face to face with patient at facility. Palliative Care was asked to follow this patient by consultation request of Dr. Leanna Battles to address advance care planning, complex medical decision making and goals of care clarification. NP called and updated Lauren [name] on visit. She affirmed patient's code status, endorsed palliative service. This is the initial visit.    ASSESSMENT AND / RECOMMENDATIONS:   Advance Care Planning: Our advance care planning conversation included a discussion about:    The value and importance of advance care planning  Difference between Hospice and Palliative care Exploration of goals of care in the event of a sudden injury or illness  Identification and preparation of a healthcare agent  Review and updating or creation of an  advance directive document . Decision not to resuscitate or to de-escalate disease focused treatments due to poor prognosis.  CODE STATUS: Patient is a DO NOT RESUSCITATE.  NP uploaded signed DNR form to epic today.  Goals of Care: Goals include to maximize quality of life and symptom management. Lauren [name] said family is interested in Hospice service in the future.  I spent 46 minutes providing this initial consultation. More than 50% of the time in this consultation was spent on counseling patient and coordinating  communication. --------------------------------------------------------------------------------------------------------------------------------------  Symptom Management/Plan: Right hip pain: From Right hip fracture s/p operative intervention; routine healing with physical therapy. Continue oxycontin, Oxycodone, Tylenol as ordered.   Routine CBC BMP Constipation: Continue sennosides-docusate sodium daily as needed constipation. Dysphagia: Dysphagia 1 with nectar thick liquids.  ST following. Weakness: Continue PT OT. Type 2 diabetes mellitus: Continue metformin, diabetic diet, no concentrated sweets. Follow up: Palliative care will continue to follow for complex medical decision making, advance care planning, and clarification of goals. Return 6 weeks or prn. Encouraged to call provider sooner with any concerns.   Family /Caregiver/Community Supports: Patient in SNF for ongoing care  HOSPICE ELIGIBILITY/DIAGNOSIS: TBD  Chief Complaint: Initial Palliative care visit  HISTORY OF PRESENT ILLNESS:  Lauren [name] is a 87 y.o. year old female  with multiple medical conditions including acute right hip pain following recent fall which resulted in right hip fracture. Hospitalization 11/01/2020-617/2022 for displaced intertrochanteric fracture of right femur which required operative repair; discharged to SNF for acute rehab. Right hip pain is intermittent, worse during movement and activity, impairs her independence and activities of daily living.  Pain medication is helpful. She was premedicated before visit and reports no pain during visit. 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  Review and summarization of Epic records shows history from other than patient. Rest of 10 point ROS asked and negative.      Review of lab tests/diagnostics   Results for Lauren, [name] (MRN [id number]) as of 11/15/2020 14:40  Ref. Range 11/07/2020 04:07  Sodium Latest Ref Range: 135 - 145 mmol/L 134 (L)  Potassium Latest Ref Range: 3.5 - 5.1 mmol/L 3.3 (L)  Chloride Latest Ref Range: 98 - 111 mmol/L 94 (L)  CO2 Latest Ref Range: 22 - 32 mmol/L 32  Glucose Latest Ref Range: 70 - 99 mg/dL 147 (H)  BUN Latest Ref Range: 8 - 23 mg/dL 17  Creatinine Latest Ref Range: 0.44 - 1.00 mg/dL 0.62  Calcium Latest Ref Range: 8.9 - 10.3 mg/dL 9.0  Anion gap Latest Ref Range: 5 - 15  8  GFR, Estimated Latest Ref Range: >60 mL/min >60    ROS General: NAD EYES: denies vision changes ENMT: denies dysphagia Cardiovascular: denies chest pain/discomfort Pulmonary: denies cough, denies SOB Abdomen: endorses good appetite, denies constipation/diarrhea GU: denies dysuria, urinary frequency MSK:  endorses weakness,  no falls reported Skin: denies rashes or wounds Neurological: endorses occasional pain, denies insomnia Psych: Endorses positive mood Heme/lymph/immuno: denies bruises, abnormal bleeding  Physical Exam: Height/Weight 5 feet 5 inches/110.2 Ibs Constitutional: NAD General: Well groomed, cooperative EYES: anicteric sclera, lids intact, no discharge  ENMT: Moist mucous membrane CV: S1 S2, RRR, no LE edema Pulmonary: LCTA, no increased work of breathing, no cough, Abdomen: active BS + 4 quadrants, soft and non tender GU: no suprapubic tenderness MSK: weakness, sarcopenia, limited ROM Skin: warm and dry, no rashes or wounds on visible skin Neuro:  weakness, otherwise non focal; cognitive deficits Psych: non-anxious affect Hem/lymph/immuno: no widespread bruising   PAST MEDICAL HISTORY:  Active Ambulatory Problems    Diagnosis Date Noted   Type 2 diabetes mellitus with hyperlipidemia ([place]) 10/06/2007   Hyperlipidemia associated with type 2 diabetes mellitus ([place]) 11/29/2006   Migraine variant 11/06/2009    Transient ischemic attack 09/10/2008   VENOUS INSUFFICIENCY 10/06/2007   Diverticulosis of colon 10/06/2007   FIBROCYSTIC BREAST DISEASE 10/06/2007   DEGENERATIVE JOINT DISEASE 02/13/2010   Osteopenia 10/18/2008   Chronic abdominal pain 09/17/2010   History of pelvic fracture 09/17/2010   History of breast cancer, left 01/08/2011   History of melanoma excision 09/27/2012   Encounter for Medicare annual wellness exam 04/04/2013   Encounter for examination for admission to assisted living facility 04/04/2013   Left knee pain 09/22/2013   Estrogen deficiency 05/01/2014   GERD (gastroesophageal reflux disease) 08/06/2015   Hearing loss 08/06/2015   Hx of completed stroke 08/15/2016   HTN (hypertension) 08/15/2016   Urinary frequency 62/70/3500   Diastolic dysfunction 93/81/8299   Hypothyroid 03/04/2017   Right sided weakness 09/07/2017   Bloating 11/30/2017   Weight loss 08/01/2018   Diabetic neuropathy ([place]) 11/22/2018   Fall at home, initial encounter 12/16/2018   Exposure to communicable disease 12/16/2018   Exposure to COVID-19 virus 12/26/2018   Closed intertrochanteric fracture of right hip ([place]) 11/01/2020   Lethargy    Closed hip fracture requiring operative repair, right, sequela    Wheeze    Depression    AKI (acute kidney injury) ([place])    Drop in hemoglobin    Dysarthria    Cerebrovascular accident (CVA) due to bilateral embolism of vertebral arteries (HCC)    Paroxysmal atrial fibrillation (HCC)    Acute blood loss anemia    Lung nodules    Protein-calorie malnutrition, severe 11/07/2020   Resolved Ambulatory Problems    Diagnosis Date Noted   Breast cancer, left breast ([place]) 09/10/2008   Diarrhea 09/17/2010   Dysphagia 09/17/2010   Dog bite(E906.0) 09/17/2010   Epistaxis 07/01/2011  Seborrheic keratosis, inflamed 09/22/2011   Cellulitis 09/27/2011   Viral URI 02/05/2012   Hypoglycemia 10/27/2012   Chest pain 01/11/2013   Leukocytosis, unspecified  01/12/2013   Chest pain, atypical 01/25/2013   Stress reaction 01/25/2013   Abnormal TSH 04/04/2013   Streptococcal sore throat 06/07/2013   Cough 06/27/2013   SOB (shortness of breath) 06/27/2013   Pneumonia involving right lung 07/12/2013   Chest pain, musculoskeletal 08/05/2013   Viral URI with cough 04/02/2014   Productive cough 05/01/2014   Viral syndrome 04/29/2015   Acute bronchitis 09/17/2016   UTI (urinary tract infection) 09/22/2016   Skin lesion of foot 09/07/2017   Leukocytosis 11/30/2017   Past Medical History:  Diagnosis Date   Breast cancer ([place])    Chronic constipation    Chronic headaches    Diabetes mellitus    Fibrocystic breast    Gastritis    Gout    Hyperlipidemia    Melanoma in situ of left upper extremity (HCC)    Pelvic fracture (HCC)    TIA (transient ischemic attack) 08/2008   Venous insufficiency     SOCIAL HX:  Social History   Tobacco Use   Smoking status: Never   Smokeless tobacco: Never  Substance Use Topics   Alcohol use: No    Alcohol/week: 0.0 standard drinks     FAMILY HX:  Family History  Problem Relation Age of Onset   Breast cancer Sister    Stroke Sister    Cancer Sister        breast   Breast cancer Sister    Stroke Sister    Cancer Sister        breast   Stroke Mother    Stroke Father    Stroke Brother    Cancer Brother        prostate   Stroke Brother    Stroke Sister    CAD Sister       ALLERGIES:  Allergies  Allergen Reactions   Sulfonamide Derivatives Itching   Aspirin Other (See Comments)    Burning and pain in stomach      PERTINENT MEDICATIONS:  Outpatient Encounter Medications as of 11/15/2020  Medication Sig   acetaminophen (TYLENOL) 325 MG tablet Take 2 tablets (650 mg total) by mouth every 6 (six) hours as needed for mild pain (or Fever >/= 101).   alum & mag hydroxide-simeth (MAALOX/MYLANTA) 200-200-20 MG/5ML suspension Take 30 mLs by mouth every 6 (six) hours as needed (gas, abdominal  pain).   apixaban (ELIQUIS) 2.5 MG TABS tablet Take 1 tablet (2.5 mg total) by mouth 2 (two) times daily.   Blood Glucose Monitoring Suppl (FREESTYLE LITE) DEVI USE AS DIRECTED TO CHECK BLOOD SUGAR ONCE A DAY AND AS NEEDED (DX. E11.9)   Cholecalciferol (VITAMIN D3) 1.25 MG (50000 UT) CAPS Take 50,000 Units by mouth every 30 (thirty) days.   citalopram (CELEXA) 10 MG tablet Take 0.5 tablets (5 mg total) by mouth daily.   dicyclomine (BENTYL) 10 MG capsule Take 10 mg by mouth 2 (two) times daily.   docusate sodium (COLACE) 100 MG capsule Take 100 mg by mouth daily.   fluticasone (FLONASE) 50 MCG/ACT nasal spray Place 1 spray into both nostrils daily.    gabapentin (NEURONTIN) 100 MG capsule Take 100 mg by mouth at bedtime.   glucose blood (FREESTYLE LITE) test strip USE AS DIRECTED TO CHECK BLOOD SUGAR ONCE A DAY AND AS NEEDED (DX. E11.9)   imiquimod (ALDARA) 5 % cream Apply  1 application topically as directed. Apply to affected area as directed by dermatologist   Lancets (FREESTYLE) lancets USE AS DIRECTED TO CHECK BLOOD SUGAR ONCE A DAY AND AS NEEDED (DX. E11.9)   levothyroxine (SYNTHROID) 50 MCG tablet Take 1 tablet (50 mcg total) by mouth daily before breakfast. 30 minutes before other medicines or supplements   metFORMIN (GLUCOPHAGE) 500 MG tablet Take 0.5 tablets (250 mg total) by mouth 2 (two) times daily with a meal.   metoprolol succinate (TOPROL-XL) 25 MG 24 hr tablet Take 1 tablet (25 mg total) by mouth daily.   Multiple Vitamins-Minerals (PRESERVISION AREDS 2 PO) Take 1 tablet by mouth 2 (two) times daily.   oxybutynin (DITROPAN-XL) 5 MG 24 hr tablet Take 5 mg by mouth daily.   oxyCODONE (OXY IR/ROXICODONE) 5 MG immediate release tablet Take 0.5 tablets (2.5 mg total) by mouth every 6 (six) hours as needed for severe pain.   pantoprazole (PROTONIX) 40 MG tablet Take 1 tablet (40 mg total) by mouth daily.   polyethylene glycol powder (GLYCOLAX/MIRALAX) powder DISSOLVE ONE (1) CAPFUL  (17GM) INTO 4 TO8 OUNCES OF FLUID AND TAKE BYMOUTH ONCE DAILY AS DIRECTED   polyvinyl alcohol (LIQUIFILM TEARS) 1.4 % ophthalmic solution Place 1 drop into both eyes 2 (two) times daily.   Probiotic Product (ALIGN) 4 MG CAPS Take 4 mg by mouth daily.    sennosides-docusate sodium (SENOKOT-S) 8.6-50 MG tablet Take 1 tablet by mouth daily as needed for constipation.   simvastatin (ZOCOR) 20 MG tablet Take 1 tablet (20 mg total) by mouth at bedtime.   vitamin B-12 (CYANOCOBALAMIN) 500 MCG tablet Take 500 mcg by mouth daily.   No facility-administered encounter medications on file as of 11/15/2020.   Thank you for the opportunity to participate in the care of Ms. Kister.  The palliative care team will continue to follow. Please call our office at [phone] if we can be of additional assistance.   Note: Portions of this note were generated with [employer]. Dictation errors may occur despite best attempts at proofreading.  Teodoro Spray, NP

## 2021-01-06 ENCOUNTER — Other Ambulatory Visit: Payer: Self-pay

## 2021-01-06 ENCOUNTER — Encounter (INDEPENDENT_AMBULATORY_CARE_PROVIDER_SITE_OTHER): Payer: Medicare Other | Admitting: Ophthalmology

## 2021-01-06 DIAGNOSIS — E113393 Type 2 diabetes mellitus with moderate nonproliferative diabetic retinopathy without macular edema, bilateral: Secondary | ICD-10-CM

## 2021-01-06 DIAGNOSIS — H43823 Vitreomacular adhesion, bilateral: Secondary | ICD-10-CM

## 2021-01-06 DIAGNOSIS — H353132 Nonexudative age-related macular degeneration, bilateral, intermediate dry stage: Secondary | ICD-10-CM

## 2021-02-25 ENCOUNTER — Other Ambulatory Visit: Payer: Self-pay | Admitting: *Deleted

## 2021-02-25 DIAGNOSIS — L98499 Non-pressure chronic ulcer of skin of other sites with unspecified severity: Secondary | ICD-10-CM

## 2021-03-09 NOTE — Progress Notes (Signed)
[facility] [place]  ASSESSMENT / PLAN: DELAYNIE [name] is a 87 y.o. female with atherosclerosis of native arteries of right lower extremity causing unstageable decubitus heel ulceration.  Patient counseled patients with chronic limb threatening ischemia have an annual risk of cardiovascular mortality of 25% and a high risk of amputation.   Recommend the following which can slow the progression of atherosclerosis and reduce the risk of major adverse cardiac / limb events:  Complete cessation from all tobacco products. Blood glucose control with goal A1c < 7%. Blood pressure control with goal blood pressure < 140/90 mmHg. Lipid reduction therapy with goal LDL-C <100 mg/dL (<70 if symptomatic from PAD).  Aspirin 81mg  PO QD.  Atorvastatin 40-80mg  PO QD (or other "high intensity" statin therapy).  Patient quite frail, and minimally ambulatory. I suspect she has tibial disease. She is not a great candidate for revascularization.  I discussed the case in detail with her next of kin.  I offered her options including ongoing local wound care, palliative amputation, or attempted limb salvage.  I counseled that limb salvage would likely be too taxing for this frail woman.  We will return to clinic in 2 weeks to discuss these options going forward.  CHIEF COMPLAINT: right heel ulcer  HISTORY OF PRESENT ILLNESS: Lauren [name] is a 87 y.o. female referred to clinic for evaluation of right heel ulcer.  Patient is a resident in a nursing home after fall.  She is a bit confused and cannot provide much history.  She reports that the right heel ulcer has been present for many months.  Local wound care and debridement has been trialed without much success.  ABI and arterial duplex was performed revealing noncompressible waveforms at the ankle and duplex evidence of arterial disease.  Patient reports she is minimally ambulatory.  She is able to get on her feet with significant assistance.   History is augmented by discussion with her next of kin niece Lauren [name].  Past Medical History:  Diagnosis Date   Breast cancer ([place])    left breast   Chronic constipation    Chronic headaches    Diabetes mellitus    Fibrocystic breast    Gastritis    Gout    injection in the past   Hyperlipidemia    Melanoma in situ of left upper extremity ([place])    Pelvic fracture (HCC)    TIA (transient ischemic attack) 08/2008   neg MRI/MRA and CT and carotid dopplers   Venous insufficiency     Past Surgical History:  Procedure Laterality Date   APPENDECTOMY     ARM SKIN LESION BIOPSY / EXCISION Right 11/02/2016   BREAST BIOPSY Left 10/18/2007   BREAST CYST ASPIRATION     BREAST EXCISIONAL BIOPSY Right    x2   BREAST LUMPECTOMY  09/2007   with sentinel LN biopsy   BREAST LUMPECTOMY     left   EYE SURGERY     retinal, then cataract   INTRAMEDULLARY (IM) NAIL INTERTROCHANTERIC Right 11/02/2020   Procedure: INTRAMEDULLARY (IM) NAIL INTERTROCHANTRIC;  Surgeon: Corky Mull, MD;  Location: [facility] ORS;  Service: Orthopedics;  Laterality: Right;    Family History  Problem Relation Age of Onset   Breast cancer Sister    Stroke Sister    Cancer Sister        breast   Breast cancer Sister    Stroke Sister    Cancer Sister  breast   Stroke Mother    Stroke Father    Stroke Brother    Cancer Brother        prostate   Stroke Brother    Stroke Sister    CAD Sister     Social History   Socioeconomic History   Marital status: Single    Spouse name: Not on file   Number of children: Not on file   Years of education: college   Highest education level: Not on file  Occupational History   Not on file  Tobacco Use   Smoking status: Never   Smokeless tobacco: Never  Vaping Use   Vaping Use: Never used  Substance and Sexual Activity   Alcohol use: No    Alcohol/week: 0.0 standard drinks   Drug use: No   Sexual activity: Not Currently  Other Topics Concern   Not on  file  Social History Narrative   Lives with and cares for both sisters   Strong faith   Denies caffeine use    Social Determinants of Health   Financial Resource Strain: Not on file  Food Insecurity: Not on file  Transportation Needs: Not on file  Physical Activity: Not on file  Stress: Not on file  Social Connections: Not on file  Intimate Partner Violence: Not on file    Allergies  Allergen Reactions   Sulfonamide Derivatives Itching   Aspirin Other (See Comments)    Burning and pain in stomach    Current Outpatient Medications  Medication Sig Dispense Refill   acetaminophen (TYLENOL) 325 MG tablet Take 2 tablets (650 mg total) by mouth every 6 (six) hours as needed for mild pain (or Fever >/= 101).     alum & mag hydroxide-simeth (MAALOX/MYLANTA) 200-200-20 MG/5ML suspension Take 30 mLs by mouth every 6 (six) hours as needed (gas, abdominal pain).     apixaban (ELIQUIS) 2.5 MG TABS tablet Take 1 tablet (2.5 mg total) by mouth 2 (two) times daily. 60 tablet    Blood Glucose Monitoring Suppl (FREESTYLE LITE) DEVI USE AS DIRECTED TO CHECK BLOOD SUGAR ONCE A DAY AND AS NEEDED (DX. E11.9) 1 each 0   Cholecalciferol (VITAMIN D3) 1.25 MG (50000 UT) CAPS Take 50,000 Units by mouth every 30 (thirty) days.     citalopram (CELEXA) 10 MG tablet Take 0.5 tablets (5 mg total) by mouth daily. 45 tablet 3   dicyclomine (BENTYL) 10 MG capsule Take 10 mg by mouth 2 (two) times daily.     docusate sodium (COLACE) 100 MG capsule Take 100 mg by mouth daily.     fluticasone (FLONASE) 50 MCG/ACT nasal spray Place 1 spray into both nostrils daily.      furosemide (LASIX) 40 MG tablet Take by mouth.     gabapentin (NEURONTIN) 100 MG capsule Take 100 mg by mouth at bedtime.     glucose blood (FREESTYLE LITE) test strip USE AS DIRECTED TO CHECK BLOOD SUGAR ONCE A DAY AND AS NEEDED (DX. E11.9) 100 each 1   imiquimod (ALDARA) 5 % cream Apply 1 application topically as directed. Apply to affected area as  directed by dermatologist     Lancets (FREESTYLE) lancets USE AS DIRECTED TO CHECK BLOOD SUGAR ONCE A DAY AND AS NEEDED (DX. E11.9) 100 each 1   levothyroxine (SYNTHROID) 50 MCG tablet Take 1 tablet (50 mcg total) by mouth daily before breakfast. 30 minutes before other medicines or supplements 90 tablet 3   metFORMIN (GLUCOPHAGE) 500 MG tablet Take  0.5 tablets (250 mg total) by mouth 2 (two) times daily with a meal. 180 tablet 3   metoprolol succinate (TOPROL-XL) 25 MG 24 hr tablet Take 1 tablet (25 mg total) by mouth daily.     Multiple Vitamins-Minerals (PRESERVISION AREDS 2 PO) Take 1 tablet by mouth 2 (two) times daily.     oxybutynin (DITROPAN-XL) 5 MG 24 hr tablet Take 5 mg by mouth daily.     oxyCODONE (OXY IR/ROXICODONE) 5 MG immediate release tablet Take 0.5 tablets (2.5 mg total) by mouth every 6 (six) hours as needed for severe pain. 10 tablet 0   pantoprazole (PROTONIX) 40 MG tablet Take 1 tablet (40 mg total) by mouth daily. 90 tablet 3   polyethylene glycol powder (GLYCOLAX/MIRALAX) powder DISSOLVE ONE (1) CAPFUL (17GM) INTO 4 TO8 OUNCES OF FLUID AND TAKE BYMOUTH ONCE DAILY AS DIRECTED 527 g 1   polyvinyl alcohol (LIQUIFILM TEARS) 1.4 % ophthalmic solution Place 1 drop into both eyes 2 (two) times daily.     Probiotic Product (ALIGN) 4 MG CAPS Take 4 mg by mouth daily.      sennosides-docusate sodium (SENOKOT-S) 8.6-50 MG tablet Take 1 tablet by mouth daily as needed for constipation.     simvastatin (ZOCOR) 20 MG tablet Take 1 tablet (20 mg total) by mouth at bedtime. 90 tablet 3   vitamin B-12 (CYANOCOBALAMIN) 500 MCG tablet Take 500 mcg by mouth daily.     No current facility-administered medications for this visit.    REVIEW OF SYSTEMS:  Unable to obtain - patient confused PHYSICAL EXAM Vitals:   03/11/21 1030  BP: (!) 94/47  Pulse: 80  Resp: 16  Temp: 98 F (36.7 C)  SpO2: 98%  Weight: 97 lb (44 kg)  Height: 5\' 3"  (1.6 m)    Constitutional: Elderly. Frail. No  distress. Appears undernourished. In a wheelchair. Not able to rise independently.  Neurologic: CN intact. No focal findings. No sensory loss. Psychiatric:  Mood and affect symmetric and appropriate. Eyes:  No icterus. No conjunctival pallor. Ears, nose, throat:  mucous membranes moist. Midline trachea.  Cardiac: regular rate and rhythm.  Respiratory:  unlabored. Abdominal:  soft, non-tender, non-distended.  Peripheral vascular: 2+ popliteal pulses. no palpable pedal pulses. Extremity: no edema. no cyanosis. no pallor.  Skin: no gangrene. Half dollar sized posterior right heel decubitus ulceration which is unstageable.  Lymphatic: no Stemmer's sign. no palpable lymphadenopathy.  PERTINENT LABORATORY AND RADIOLOGIC DATA  Most recent CBC CBC Latest Ref Rng & Units 11/07/2020 11/06/2020 11/05/2020  WBC 4.0 - 10.5 K/uL 18.0(H) 16.1(H) 13.1(H)  Hemoglobin 12.0 - 15.0 g/dL 10.9(L) 11.1(L) 7.4(L)  Hematocrit 36.0 - 46.0 % 32.2(L) 33.2(L) 22.3(L)  Platelets 150 - 400 K/uL 266 247 174     Most recent CMP CMP Latest Ref Rng & Units 11/07/2020 11/06/2020 11/05/2020  Glucose 70 - 99 mg/dL 147(H) 184(H) 106(H)  BUN 8 - 23 mg/dL 17 13 12   Creatinine 0.44 - 1.00 mg/dL 0.62 0.48 0.52  Sodium 135 - 145 mmol/L 134(L) 135 133(L)  Potassium 3.5 - 5.1 mmol/L 3.3(L) 3.4(L) 4.0  Chloride 98 - 111 mmol/L 94(L) 92(L) 97(L)  CO2 22 - 32 mmol/L 32 30 30  Calcium 8.9 - 10.3 mg/dL 9.0 9.1 8.5(L)  Total Protein 6.5 - 8.1 g/dL - - -  Total Bilirubin 0.3 - 1.2 mg/dL - - -  Alkaline Phos 38 - 126 U/L - - -  AST 15 - 41 U/L - - -  ALT 0 -  44 U/L - - -    Renal function CrCl cannot be calculated (Patient's most recent lab result is older than the maximum 21 days allowed.).  Hgb A1c MFr Bld (%)  Date Value  11/05/2020 6.7 (H)    LDL Cholesterol  Date Value Ref Range Status  11/05/2020 34 0 - 99 mg/dL Final    Comment:           Total Cholesterol/HDL:CHD Risk Coronary Heart Disease Risk Table                      Men   Women  1/2 Average Risk   3.4   3.3  Average Risk       5.0   4.4  2 X Average Risk   9.6   7.1  3 X Average Risk  23.4   11.0        Use the calculated Patient Ratio above and the CHD Risk Table to determine the patient's CHD Risk.        ATP III CLASSIFICATION (LDL):  <100     mg/dL   Optimal  100-129  mg/dL   Near or Above                    Optimal  130-159  mg/dL   Borderline  160-189  mg/dL   High  >190     mg/dL   Very High Performed at [facility], [address] [place], [place] [address]    Direct LDL  Date Value Ref Range Status  02/23/2017 49.0 mg/dL Final    Comment:    Optimal:  <100 mg/dLNear or Above Optimal:  100-129 mg/dLBorderline High:  130-159 mg/dLHigh:  160-189 mg/dLVery High:  >190 mg/dL     Vascular Imaging: +-------+-----------+-----------+------------+------------+  ABI/TBIToday's ABIToday's TBIPrevious ABIPrevious TBI  +-------+-----------+-----------+------------+------------+  Right  1.25       1.08                                 +-------+-----------+-----------+------------+------------+  Left   1.24       0.79                                 +-------+-----------+-----------+------------+------------+   Yevonne Aline. Stanford Breed, MD [facility] Phone Number: [phone] 03/11/2021 4:31 PM  Total time spent on preparing this encounter including chart review, data review, collecting history, examining the patient, coordinating care for this new patient, 60 minutes.  Portions of this report may have been transcribed using voice recognition software.  Every effort has been made to ensure accuracy; however, inadvertent computerized transcription errors may still be present.

## 2021-03-11 ENCOUNTER — Ambulatory Visit (INDEPENDENT_AMBULATORY_CARE_PROVIDER_SITE_OTHER): Payer: Medicare Other | Admitting: Vascular Surgery

## 2021-03-11 ENCOUNTER — Encounter: Payer: Self-pay | Admitting: Vascular Surgery

## 2021-03-11 ENCOUNTER — Ambulatory Visit (HOSPITAL_COMMUNITY)
Admission: RE | Admit: 2021-03-11 | Discharge: 2021-03-11 | Disposition: A | Discharge status: Home / Self Care | Payer: Medicare Other | Source: Ambulatory Visit | Attending: Vascular Surgery | Admitting: Vascular Surgery

## 2021-03-11 VITALS — BP 94/47 | HR 80 | Temp 98.0°F | Resp 16 | Ht 63.0 in | Wt 97.0 lb

## 2021-03-11 DIAGNOSIS — L97419 Non-pressure chronic ulcer of right heel and midfoot with unspecified severity: Secondary | ICD-10-CM | POA: Diagnosis not present

## 2021-03-11 DIAGNOSIS — L98499 Non-pressure chronic ulcer of skin of other sites with unspecified severity: Secondary | ICD-10-CM | POA: Diagnosis present

## 2021-03-25 ENCOUNTER — Ambulatory Visit: Payer: Medicare Other | Admitting: Vascular Surgery

## 2021-03-31 NOTE — Progress Notes (Signed)
[facility] [place]  ASSESSMENT / PLAN: DAISY LITES is a 87 y.o. female with atherosclerosis of native arteries of right lower extremity causing unstageable decubitus heel ulceration.  Patient counseled patients with chronic limb threatening ischemia have an annual risk of cardiovascular mortality of 25% and a high risk of amputation.   Recommend the following which can slow the progression of atherosclerosis and reduce the risk of major adverse cardiac / limb events:  Complete cessation from all tobacco products. Blood glucose control with goal A1c < 7%. Blood pressure control with goal blood pressure < 140/90 mmHg. Lipid reduction therapy with goal LDL-C <100 mg/dL (<70 if symptomatic from PAD).  Aspirin 81mg  PO QD.  Atorvastatin 40-80mg  PO QD (or other "high intensity" statin therapy).  Thankfully the patient's right heel ulcer is healing appropriately.  Recommend continue local wound care.  She can follow-up with me as needed.  CHIEF COMPLAINT: right heel ulcer  HISTORY OF PRESENT ILLNESS: Lauren [name] is a 87 y.o. female referred to clinic for evaluation of right heel ulcer.  Patient is a resident in a nursing home after fall.  She is a bit confused and cannot provide much history.  She reports that the right heel ulcer has been present for many months.  Local wound care and debridement has been trialed without much success.  ABI and arterial duplex was performed revealing noncompressible waveforms at the ankle and duplex evidence of arterial disease.  Patient reports she is minimally ambulatory.  She is able to get on her feet with significant assistance.  History is augmented by discussion with her next of kin niece [name].  04/01/21: Patient returns to clinic with a niece.  She is doing well.  The heel ulcer appears to be improving with conservative, local wound care.  Past Medical History:  Diagnosis Date   Breast cancer ([place])    left breast    Chronic constipation    Chronic headaches    Diabetes mellitus    Fibrocystic breast    Gastritis    Gout    injection in the past   Hyperlipidemia    Melanoma in situ of left upper extremity ([place])    Pelvic fracture ([facility])    TIA (transient ischemic attack) 08/2008   neg MRI/MRA and CT and carotid dopplers   Venous insufficiency     Past Surgical History:  Procedure Laterality Date   APPENDECTOMY     ARM SKIN LESION BIOPSY / EXCISION Right 11/02/2016   BREAST BIOPSY Left 10/18/2007   BREAST CYST ASPIRATION     BREAST EXCISIONAL BIOPSY Right    x2   BREAST LUMPECTOMY  09/2007   with sentinel LN biopsy   BREAST LUMPECTOMY     left   EYE SURGERY     retinal, then cataract   INTRAMEDULLARY (IM) NAIL INTERTROCHANTERIC Right 11/02/2020   Procedure: INTRAMEDULLARY (IM) NAIL INTERTROCHANTRIC;  Surgeon: Corky Mull, MD;  Location: [facility] ORS;  Service: Orthopedics;  Laterality: Right;    Family History  Problem Relation Age of Onset   Breast cancer Sister    Stroke Sister    Cancer Sister        breast   Breast cancer Sister    Stroke Sister    Cancer Sister        breast   Stroke Mother    Stroke Father    Stroke Brother    Cancer Brother  prostate   Stroke Brother    Stroke Sister    CAD Sister     Social History   Socioeconomic History   Marital status: Single    Spouse name: Not on file   Number of children: Not on file   Years of education: college   Highest education level: Not on file  Occupational History   Not on file  Tobacco Use   Smoking status: Never   Smokeless tobacco: Never  Vaping Use   Vaping Use: Never used  Substance and Sexual Activity   Alcohol use: No    Alcohol/week: 0.0 standard drinks   Drug use: No   Sexual activity: Not Currently  Other Topics Concern   Not on file  Social History Narrative   Lives with and cares for both sisters   Strong faith   Denies caffeine use    Social Determinants of Health   Financial  Resource Strain: Not on file  Food Insecurity: Not on file  Transportation Needs: Not on file  Physical Activity: Not on file  Stress: Not on file  Social Connections: Not on file  Intimate Partner Violence: Not on file    Allergies  Allergen Reactions   Sulfonamide Derivatives Itching   Aspirin Other (See Comments)    Burning and pain in stomach    Current Outpatient Medications  Medication Sig Dispense Refill   acetaminophen (TYLENOL) 325 MG tablet Take 2 tablets (650 mg total) by mouth every 6 (six) hours as needed for mild pain (or Fever >/= 101).     alum & mag hydroxide-simeth (MAALOX/MYLANTA) 200-200-20 MG/5ML suspension Take 30 mLs by mouth every 6 (six) hours as needed (gas, abdominal pain).     apixaban (ELIQUIS) 2.5 MG TABS tablet Take 1 tablet (2.5 mg total) by mouth 2 (two) times daily. 60 tablet    Blood Glucose Monitoring Suppl (FREESTYLE LITE) DEVI USE AS DIRECTED TO CHECK BLOOD SUGAR ONCE A DAY AND AS NEEDED (DX. E11.9) 1 each 0   Cholecalciferol (VITAMIN D3) 1.25 MG (50000 UT) CAPS Take 50,000 Units by mouth every 30 (thirty) days.     citalopram (CELEXA) 10 MG tablet Take 0.5 tablets (5 mg total) by mouth daily. 45 tablet 3   dicyclomine (BENTYL) 10 MG capsule Take 10 mg by mouth 2 (two) times daily.     docusate sodium (COLACE) 100 MG capsule Take 100 mg by mouth daily.     fluticasone (FLONASE) 50 MCG/ACT nasal spray Place 1 spray into both nostrils daily.      furosemide (LASIX) 40 MG tablet Take by mouth.     gabapentin (NEURONTIN) 100 MG capsule Take 100 mg by mouth at bedtime.     glucose blood (FREESTYLE LITE) test strip USE AS DIRECTED TO CHECK BLOOD SUGAR ONCE A DAY AND AS NEEDED (DX. E11.9) 100 each 1   imiquimod (ALDARA) 5 % cream Apply 1 application topically as directed. Apply to affected area as directed by dermatologist     Lancets (FREESTYLE) lancets USE AS DIRECTED TO CHECK BLOOD SUGAR ONCE A DAY AND AS NEEDED (DX. E11.9) 100 each 1   levothyroxine  (SYNTHROID) 50 MCG tablet Take 1 tablet (50 mcg total) by mouth daily before breakfast. 30 minutes before other medicines or supplements 90 tablet 3   metFORMIN (GLUCOPHAGE) 500 MG tablet Take 0.5 tablets (250 mg total) by mouth 2 (two) times daily with a meal. 180 tablet 3   metoprolol succinate (TOPROL-XL) 25 MG 24 hr tablet  Take 1 tablet (25 mg total) by mouth daily.     Multiple Vitamins-Minerals (PRESERVISION AREDS 2 PO) Take 1 tablet by mouth 2 (two) times daily.     oxybutynin (DITROPAN-XL) 5 MG 24 hr tablet Take 5 mg by mouth daily.     oxyCODONE (OXY IR/ROXICODONE) 5 MG immediate release tablet Take 0.5 tablets (2.5 mg total) by mouth every 6 (six) hours as needed for severe pain. 10 tablet 0   pantoprazole (PROTONIX) 40 MG tablet Take 1 tablet (40 mg total) by mouth daily. 90 tablet 3   polyethylene glycol powder (GLYCOLAX/MIRALAX) powder DISSOLVE ONE (1) CAPFUL (17GM) INTO 4 TO8 OUNCES OF FLUID AND TAKE BYMOUTH ONCE DAILY AS DIRECTED 527 g 1   polyvinyl alcohol (LIQUIFILM TEARS) 1.4 % ophthalmic solution Place 1 drop into both eyes 2 (two) times daily.     Probiotic Product (ALIGN) 4 MG CAPS Take 4 mg by mouth daily.      sennosides-docusate sodium (SENOKOT-S) 8.6-50 MG tablet Take 1 tablet by mouth daily as needed for constipation.     simvastatin (ZOCOR) 20 MG tablet Take 1 tablet (20 mg total) by mouth at bedtime. 90 tablet 3   vitamin B-12 (CYANOCOBALAMIN) 500 MCG tablet Take 500 mcg by mouth daily.     No current facility-administered medications for this visit.    REVIEW OF SYSTEMS:  Unable to obtain - patient confused PHYSICAL EXAM There were no vitals filed for this visit.   Constitutional: Elderly. Frail. No distress. Appears undernourished. In a wheelchair. Not able to rise independently.  Neurologic: CN intact. No focal findings. No sensory loss. Psychiatric:  Mood and affect symmetric and appropriate. Eyes:  No icterus. No conjunctival pallor. Ears, nose, throat:   mucous membranes moist. Midline trachea.  Cardiac: regular rate and rhythm.  Respiratory:  unlabored. Abdominal:  soft, non-tender, non-distended.  Peripheral vascular: 2+ popliteal pulses. no palpable pedal pulses. Extremity: no edema. no cyanosis. no pallor.  Skin: no gangrene.  Right heel posterior ulcer has been unroofed and appears to have a healing edge.  Fibrinous exudate at the base.  Overall reassuring appearance of the ulceration. Lymphatic: no Stemmer's sign. no palpable lymphadenopathy.  PERTINENT LABORATORY AND RADIOLOGIC DATA  Most recent CBC CBC Latest Ref Rng & Units 11/07/2020 11/06/2020 11/05/2020  WBC 4.0 - 10.5 K/uL 18.0(H) 16.1(H) 13.1(H)  Hemoglobin 12.0 - 15.0 g/dL 10.9(L) 11.1(L) 7.4(L)  Hematocrit 36.0 - 46.0 % 32.2(L) 33.2(L) 22.3(L)  Platelets 150 - 400 K/uL 266 247 174     Most recent CMP CMP Latest Ref Rng & Units 11/07/2020 11/06/2020 11/05/2020  Glucose 70 - 99 mg/dL 147(H) 184(H) 106(H)  BUN 8 - 23 mg/dL 17 13 12   Creatinine 0.44 - 1.00 mg/dL 0.62 0.48 0.52  Sodium 135 - 145 mmol/L 134(L) 135 133(L)  Potassium 3.5 - 5.1 mmol/L 3.3(L) 3.4(L) 4.0  Chloride 98 - 111 mmol/L 94(L) 92(L) 97(L)  CO2 22 - 32 mmol/L 32 30 30  Calcium 8.9 - 10.3 mg/dL 9.0 9.1 8.5(L)  Total Protein 6.5 - 8.1 g/dL - - -  Total Bilirubin 0.3 - 1.2 mg/dL - - -  Alkaline Phos 38 - 126 U/L - - -  AST 15 - 41 U/L - - -  ALT 0 - 44 U/L - - -    Renal function CrCl cannot be calculated (Patient's most recent lab result is older than the maximum 21 days allowed.).  Hgb A1c MFr Bld (%)  Date Value  11/05/2020 6.7 (H)  LDL Cholesterol  Date Value Ref Range Status  11/05/2020 34 0 - 99 mg/dL Final    Comment:           Total Cholesterol/HDL:CHD Risk Coronary Heart Disease Risk Table                     Men   Women  1/2 Average Risk   3.4   3.3  Average Risk       5.0   4.4  2 X Average Risk   9.6   7.1  3 X Average Risk  23.4   11.0        Use the calculated Patient  Ratio above and the CHD Risk Table to determine the patient's CHD Risk.        ATP III CLASSIFICATION (LDL):  <100     mg/dL   Optimal  100-129  mg/dL   Near or Above                    Optimal  130-159  mg/dL   Borderline  160-189  mg/dL   High  >190     mg/dL   Very High Performed at [facility], [address] [place], [place] [address]    Direct LDL  Date Value Ref Range Status  02/23/2017 49.0 mg/dL Final    Comment:    Optimal:  <100 mg/dLNear or Above Optimal:  100-129 mg/dLBorderline High:  130-159 mg/dLHigh:  160-189 mg/dLVery High:  >190 mg/dL     Vascular Imaging: +-------+-----------+-----------+------------+------------+  ABI/TBIToday's ABIToday's TBIPrevious ABIPrevious TBI  +-------+-----------+-----------+------------+------------+  Right  1.25       1.08                                 +-------+-----------+-----------+------------+------------+  Left   1.24       0.79                                 +-------+-----------+-----------+------------+------------+   Yevonne Aline. Stanford Breed, MD [facility] Phone Number: [phone] 03/31/2021 11:00 AM  Total time spent on preparing this encounter including chart review, data review, collecting history, examining the patient, coordinating care for this was patient, 30 minutes.  Portions of this report may have been transcribed using voice recognition software.  Every effort has been made to ensure accuracy; however, inadvertent computerized transcription errors may still be present.

## 2021-04-01 ENCOUNTER — Ambulatory Visit (INDEPENDENT_AMBULATORY_CARE_PROVIDER_SITE_OTHER): Payer: Medicare Other | Admitting: Vascular Surgery

## 2021-04-01 ENCOUNTER — Other Ambulatory Visit: Payer: Self-pay

## 2021-04-01 ENCOUNTER — Non-Acute Institutional Stay: Payer: Medicare Other | Admitting: Hospice

## 2021-04-01 ENCOUNTER — Encounter: Payer: Self-pay | Admitting: Vascular Surgery

## 2021-04-01 VITALS — BP 126/62 | HR 102 | Temp 98.4°F | Resp 20 | Ht 63.0 in | Wt 97.0 lb

## 2021-04-01 DIAGNOSIS — I739 Peripheral vascular disease, unspecified: Secondary | ICD-10-CM

## 2021-04-01 DIAGNOSIS — Z515 Encounter for palliative care: Secondary | ICD-10-CM

## 2021-04-01 DIAGNOSIS — G8929 Other chronic pain: Secondary | ICD-10-CM

## 2021-04-01 DIAGNOSIS — R1310 Dysphagia, unspecified: Secondary | ICD-10-CM

## 2021-04-01 DIAGNOSIS — R531 Weakness: Secondary | ICD-10-CM

## 2021-04-01 NOTE — Progress Notes (Signed)
[facility] Consult Note Telephone: [phone]  Fax: [phone]  PATIENT NAME: Lauren [name] DOB: 1927/05/29 MRN: [id number]  PRIMARY CARE PROVIDER:   Leanna Battles, MD Leanna Battles, MD [place],  [place] [address]  REFERRING PROVIDER: Leanna Battles, MD Leanna Battles, [address],  [place] [address]  RESPONSIBLE PARTY:  Claudette Head Information     Name Relation Home Work [place] Niece [phone]  [phone]   Slaughter,Nancy Niece [phone]  [phone]   Clapps,Douglas Other [phone]  [phone]       Visit is to build trust and highlight Palliative Medicine as specialized medical care for people living with serious illness, aimed at facilitating better quality of life through symptoms relief, assisting with advance care planning and complex medical decision making. This is a follow up visit.  RECOMMENDATIONS/PLAN:   Advance Care Planning/Code Status: Patient is a Do Not Resuscitate  Goals of Care: Goals of care include to maximize quality of life and symptom management.  Visit consisted of counseling and education dealing with the complex and emotionally intense issues of symptom management and palliative care in the setting of serious and potentially life-threatening illness.  Ample emotional support provided. Palliative care team will continue to support patient, patient's family, and medical team.  Symptom management/Plan:  Right hip pain: Managed with scheduled Tylenol and prn Norco.  Routine CBC BMP Weakness: Physical therapy is ongoing for strengthening and gait training. Constipation: Managed with Colace, Miralax Dysphagia: Continue Dysphagia 1 with nectar thick liquids. Choking precautions. ST consult as needed.  Patient is followed by Vascular for right heel ulcer.  Follow up: Palliative care will continue to follow for complex medical  decision making, advance care planning, and clarification of goals. Return 6 weeks or prn. Encouraged to call provider sooner with any concerns.  CHIEF COMPLAINT: Palliative follow up  HISTORY OF PRESENT ILLNESS:  Lauren [name] a 87 y.o. female with multiple medical problems including  Right hip pain is intermittent, worse during movement and activity, impairs her independence and activities of daily living.  Pain medication - Tylenol, Norco is helpful. Weakness is ongoing and patient is currently on PT/OT. History of CVA due to embolism of bilateral vertebral arteries type 2 diabetes mellitus, dysphagia, GERD, major depressive disorder. History obtained from review of EMR, discussion with primary team, family and/or patient. Records reviewed and summarized above. All 10 point systems reviewed and are negative except as documented in history of present illness above  Review and summarization of Epic records shows history from other than patient.   Palliative Care was asked to follow this patient o help address complex decision making in the context of advance care planning and goals of care clarification.  I reviewed patient records, labs, notes, testing and imaging myself as needed and where available.  PHYSICAL EXAM  Height/Weight 5 feet 5 inches/109 Ibs Constitutional: NAD General: Well groomed, cooperative EYES: anicteric sclera, lids intact, no discharge  ENMT: Moist mucous membrane CV: S1 S2, RRR, no LE edema Pulmonary: LCTA, no increased work of breathing, no cough, Abdomen: active BS + 4 quadrants, soft and non tender GU: no suprapubic tenderness MSK: weakness, sarcopenia, limited ROM Skin: warm and dry, right heel dressing clean dry and intact Neuro:  weakness, otherwise non focal; cognitive deficits Psych: non-anxious affect Hem/lymph/immuno: no widespread bruising  PERTINENT MEDICATIONS:  Outpatient Encounter Medications as of 04/01/2021  Medication Sig   acetaminophen  (TYLENOL) 325 MG tablet  Take 2 tablets (650 mg total) by mouth every 6 (six) hours as needed for mild pain (or Fever >/= 101).   alum & mag hydroxide-simeth (MAALOX/MYLANTA) 200-200-20 MG/5ML suspension Take 30 mLs by mouth every 6 (six) hours as needed (gas, abdominal pain).   apixaban (ELIQUIS) 2.5 MG TABS tablet Take 1 tablet (2.5 mg total) by mouth 2 (two) times daily.   Blood Glucose Monitoring Suppl (FREESTYLE LITE) DEVI USE AS DIRECTED TO CHECK BLOOD SUGAR ONCE A DAY AND AS NEEDED (DX. E11.9)   Cholecalciferol (VITAMIN D3) 1.25 MG (50000 UT) CAPS Take 50,000 Units by mouth every 30 (thirty) days.   citalopram (CELEXA) 10 MG tablet Take 0.5 tablets (5 mg total) by mouth daily.   dicyclomine (BENTYL) 10 MG capsule Take 10 mg by mouth 2 (two) times daily.   docusate sodium (COLACE) 100 MG capsule Take 100 mg by mouth daily.   fluticasone (FLONASE) 50 MCG/ACT nasal spray Place 1 spray into both nostrils daily.    furosemide (LASIX) 40 MG tablet Take by mouth.   gabapentin (NEURONTIN) 100 MG capsule Take 100 mg by mouth at bedtime.   glucose blood (FREESTYLE LITE) test strip USE AS DIRECTED TO CHECK BLOOD SUGAR ONCE A DAY AND AS NEEDED (DX. E11.9)   imiquimod (ALDARA) 5 % cream Apply 1 application topically as directed. Apply to affected area as directed by dermatologist   Lancets (FREESTYLE) lancets USE AS DIRECTED TO CHECK BLOOD SUGAR ONCE A DAY AND AS NEEDED (DX. E11.9)   levothyroxine (SYNTHROID) 50 MCG tablet Take 1 tablet (50 mcg total) by mouth daily before breakfast. 30 minutes before other medicines or supplements   metFORMIN (GLUCOPHAGE) 500 MG tablet Take 0.5 tablets (250 mg total) by mouth 2 (two) times daily with a meal.   metoprolol succinate (TOPROL-XL) 25 MG 24 hr tablet Take 1 tablet (25 mg total) by mouth daily.   Multiple Vitamins-Minerals (PRESERVISION AREDS 2 PO) Take 1 tablet by mouth 2 (two) times daily.   oxybutynin (DITROPAN-XL) 5 MG 24 hr tablet Take 5 mg by mouth daily.    oxyCODONE (OXY IR/ROXICODONE) 5 MG immediate release tablet Take 0.5 tablets (2.5 mg total) by mouth every 6 (six) hours as needed for severe pain.   pantoprazole (PROTONIX) 40 MG tablet Take 1 tablet (40 mg total) by mouth daily.   polyethylene glycol powder (GLYCOLAX/MIRALAX) powder DISSOLVE ONE (1) CAPFUL (17GM) INTO 4 TO8 OUNCES OF FLUID AND TAKE BYMOUTH ONCE DAILY AS DIRECTED   polyvinyl alcohol (LIQUIFILM TEARS) 1.4 % ophthalmic solution Place 1 drop into both eyes 2 (two) times daily.   Probiotic Product (ALIGN) 4 MG CAPS Take 4 mg by mouth daily.    sennosides-docusate sodium (SENOKOT-S) 8.6-50 MG tablet Take 1 tablet by mouth daily as needed for constipation.   simvastatin (ZOCOR) 20 MG tablet Take 1 tablet (20 mg total) by mouth at bedtime.   vitamin B-12 (CYANOCOBALAMIN) 500 MCG tablet Take 500 mcg by mouth daily.   No facility-administered encounter medications on file as of 04/01/2021.    HOSPICE ELIGIBILITY/DIAGNOSIS: TBD  PAST MEDICAL HISTORY:  Past Medical History:  Diagnosis Date   Breast cancer ([place])    left breast   Chronic constipation    Chronic headaches    Diabetes mellitus    Fibrocystic breast    Gastritis    Gout    injection in the past   Hyperlipidemia    Melanoma in situ of left upper extremity ([facility])    Pelvic fracture (  [place])    TIA (transient ischemic attack) 08/2008   neg MRI/MRA and CT and carotid dopplers   Venous insufficiency       ALLERGIES:  Allergies  Allergen Reactions   Sulfonamide Derivatives Itching   Aspirin Other (See Comments)    Burning and pain in stomach      I spent 40 minutes providing this consultation; this includes time spent with patient/family, chart review and documentation. More than 50% of the time in this consultation was spent on counseling and coordinating communication   Thank you for the opportunity to participate in the care of ILLA ENLOW Please call our office at [phone] if we can be of additional  assistance.  Note: Portions of this note were generated with Lobby[employer]. Dictation errors may occur despite best attempts at proofreading.  Teodoro Spray, NP

## 2021-04-24 DEATH — deceased

## 2021-07-14 ENCOUNTER — Encounter (INDEPENDENT_AMBULATORY_CARE_PROVIDER_SITE_OTHER): Payer: Medicare Other | Admitting: Ophthalmology
